# Patient Record
Sex: Female | Born: 1935
Health system: Southern US, Community
[De-identification: ages and names within clinical notes are randomized; demographics above are authoritative.]

## PROBLEM LIST (undated history)

## (undated) DIAGNOSIS — G249 Dystonia, unspecified: Secondary | ICD-10-CM

## (undated) DIAGNOSIS — H409 Unspecified glaucoma: Secondary | ICD-10-CM

## (undated) DIAGNOSIS — I10 Essential (primary) hypertension: Secondary | ICD-10-CM

## (undated) DIAGNOSIS — E079 Disorder of thyroid, unspecified: Secondary | ICD-10-CM

## (undated) DIAGNOSIS — E119 Type 2 diabetes mellitus without complications: Secondary | ICD-10-CM

## (undated) HISTORY — DX: Unspecified glaucoma: H40.9

## (undated) HISTORY — DX: Dystonia, unspecified: G24.9

## (undated) HISTORY — PX: NO PAST SURGERIES: SHX2092

---

## 2016-12-29 ENCOUNTER — Encounter (HOSPITAL_BASED_OUTPATIENT_CLINIC_OR_DEPARTMENT_OTHER): Payer: Self-pay | Admitting: *Deleted

## 2016-12-29 ENCOUNTER — Emergency Department (HOSPITAL_BASED_OUTPATIENT_CLINIC_OR_DEPARTMENT_OTHER)
Admission: EM | Admit: 2016-12-29 | Discharge: 2016-12-29 | Disposition: A | Discharge status: Home / Self Care | Payer: Medicare Other | Attending: Emergency Medicine | Admitting: Emergency Medicine

## 2016-12-29 DIAGNOSIS — R05 Cough: Secondary | ICD-10-CM | POA: Diagnosis not present

## 2016-12-29 DIAGNOSIS — I10 Essential (primary) hypertension: Secondary | ICD-10-CM | POA: Diagnosis not present

## 2016-12-29 DIAGNOSIS — J029 Acute pharyngitis, unspecified: Secondary | ICD-10-CM | POA: Insufficient documentation

## 2016-12-29 DIAGNOSIS — R059 Cough, unspecified: Secondary | ICD-10-CM

## 2016-12-29 DIAGNOSIS — R21 Rash and other nonspecific skin eruption: Secondary | ICD-10-CM | POA: Insufficient documentation

## 2016-12-29 DIAGNOSIS — E119 Type 2 diabetes mellitus without complications: Secondary | ICD-10-CM | POA: Insufficient documentation

## 2016-12-29 HISTORY — DX: Essential (primary) hypertension: I10

## 2016-12-29 HISTORY — DX: Type 2 diabetes mellitus without complications: E11.9

## 2016-12-29 HISTORY — DX: Disorder of thyroid, unspecified: E07.9

## 2016-12-29 MED ORDER — TRIAMCINOLONE ACETONIDE 0.1 % EX CREA: 1.0000 "application " | TOPICAL_CREAM | Freq: Two times a day (BID) | CUTANEOUS | 0 refills | Status: DC

## 2016-12-29 NOTE — ED Triage Notes (Signed)
Pt has a red itching rash on her forearms that began Friday, it has spread and she has some on her legs and redness and itching on her face also.  She took benadryl last pm. No change in soap, lotion or detergents

## 2016-12-29 NOTE — ED Notes (Signed)
Adult female in with pt requests that the provider come back into room and assess pt for her cough and possible allergies. PA and RN made aware, pt and visitor made aware that there may be a wait for the PA to see another pt beforehand.

## 2016-12-29 NOTE — ED Notes (Signed)
Pt waiting to speak with PA regarding a cough.

## 2016-12-29 NOTE — ED Provider Notes (Signed)
[facility]-EMERGENCY DEPT Provider Note   CSN: [id number] Arrival date & time: 12/29/16  1155     History   Chief Complaint Chief Complaint  Patient presents with  . Rash    HPI Leslie Ward is a 83 y.o. female who presents with her son today for evaluation of a itchy rash. She reports that on Friday she "went out with her friends" and since then has been developing a bumpy itchy red rash. It started on her bilateral forearms and is now present additionally on her bilateral inner knees, and 1 spot on the left side of her face.  Additionally she reports some cold/allergy type symptoms, saying that she is unsure which however she has had runny nose, stuffy nose, scratchy throat. No shortness of breath, chest pain/pressure/tightness, no fevers, chills. She is here from [place] and has been visiting her son for the past month approximately.  Son denies any new beds, mattresses, or sheets in the house.  Patient does not have a history of similar rashes. No fevers/chills at home him and no new detergents, lotions, soaps. She has taken Benadryl which she says relieved both her itching and her nasal symptoms.    HPI  Past Medical History:  Diagnosis Date  . Diabetes mellitus without complication (HCC)   . Hypertension   . Thyroid disease     There are no active problems to display for this patient.   History reviewed. No pertinent surgical history.  OB History    No data available       Home Medications    Prior to Admission medications   Medication Sig Start Date End Date Taking? Authorizing Provider  triamcinolone cream (KENALOG) 0.1 % Apply 1 application topically 2 (two) times daily. 12/29/16   Cristina Gong, PA-C    Family History No family history on file.  Social History Social History  Substance Use Topics  . Smoking status: Never Smoker  . Smokeless tobacco: Not on file  . Alcohol use No     Allergies   Patient has no known allergies.   Review of  Systems Review of Systems  Constitutional: Negative for diaphoresis, fatigue and fever.  HENT: Positive for congestion, postnasal drip, rhinorrhea and sore throat. Negative for ear pain, facial swelling, mouth sores, nosebleeds, sinus pain, sinus pressure, sneezing, trouble swallowing and voice change.   Eyes: Negative for photophobia and visual disturbance.  Respiratory: Positive for cough. Negative for choking, chest tightness, shortness of breath and stridor.   Cardiovascular: Negative for chest pain and palpitations.  Gastrointestinal: Negative for abdominal pain, diarrhea, nausea and vomiting.  Endocrine: Negative for polyuria.  Genitourinary: Negative for decreased urine volume, difficulty urinating, flank pain, hematuria and urgency.  Musculoskeletal: Negative for arthralgias, back pain, myalgias, neck pain and neck stiffness.  Skin: Positive for rash. Negative for pallor.  Neurological: Negative for syncope, weakness, light-headedness and headaches.     Physical Exam Updated Vital Signs BP (!) 158/68 (BP Location: Right Arm)   Pulse 80   Temp 98.7 F (37.1 C) (Oral)   Resp 16   Wt 53.4 kg (117 lb 12.8 oz)   SpO2 97%   Physical Exam  Constitutional: She appears well-developed and well-nourished. No distress.  HENT:  Head: Normocephalic and atraumatic.  Right Ear: Tympanic membrane normal. Tympanic membrane is not perforated, not erythematous and not bulging.  Left Ear: Tympanic membrane and ear canal normal. Tympanic membrane is not perforated, not erythematous and not bulging.  Nose: Nose normal. Right sinus  exhibits no maxillary sinus tenderness and no frontal sinus tenderness. Left sinus exhibits no maxillary sinus tenderness and no frontal sinus tenderness.  Mouth/Throat: Uvula is midline.  Right ear canal shows mild erythema, consistent with patient report of sticking her long fingernails into her ears no swelling, drainage, or tragus tenderness  Eyes: Conjunctivae are  normal. Right eye exhibits no discharge. Left eye exhibits no discharge. No scleral icterus.  Neck: Normal range of motion.  Cardiovascular: Normal rate, regular rhythm and normal heart sounds.   No murmur heard. Pulmonary/Chest: Effort normal and breath sounds normal. No stridor. No respiratory distress. She has no decreased breath sounds. She has no wheezes. She has no rhonchi. She has no rales.  Abdominal: Soft. She exhibits no distension. There is no tenderness.  Musculoskeletal: She exhibits no edema or deformity.  Lymphadenopathy:    She has no cervical adenopathy.  Neurological: She is alert. She exhibits normal muscle tone.  Skin: Skin is warm and dry. Rash noted. She is not diaphoretic.  Multiple pustules/vesicles on red base in linear pattern to bilateral forearms, with lesion on left cheek, bilateral lower legs/knees.  Appear consistent with bug bites.  No obvious drainage from wounds.    Psychiatric: She has a normal mood and affect. Her behavior is normal.  Nursing note and vitals reviewed.    ED Treatments / Results  Labs (all labs ordered are listed, but only abnormal results are displayed) Labs Reviewed - No data to display  EKG  EKG Interpretation None       Radiology No results found.  Procedures Procedures (including critical care time)  Medications Ordered in ED Medications - No data to display   Initial Impression / Assessment and Plan / ED Course  I have reviewed the triage vital signs and the nursing notes.  Pertinent labs & imaging results that were available during my care of the patient were reviewed by me and considered in my medical decision making (see chart for details).    Leslie Ward presents with a rash consistent with bug bites.  Have high suspicion for bed bug bites given lack of outdoor exposure and linear pattern of bites.  She will be given Triamcinolone cream to reduce itching and her son, who she is staying with, has been  instructed to hire a professional exterminator to evaluate for the potential presence of bedbugs.    Patient's scratchy throat is consistent with either allergies or a cold.  She was given the option for a chest x-ray, however declined imaging.  Low suspicion for pneumonia due to afebrile, normal vitals, lungs clear to auscultation bilaterally.  Appearance of throat and history is not consistent with strep throat.  Patient will be discharged and she and her son were both given strict return precautions and voiced their understanding. Patient does not have a local PCP, and was instructed to return to [facility] as needed.   At this time there does not appear to be any evidence of an acute emergency medical condition and the patient appears stable for discharge with appropriate outpatient follow up.Diagnosis was discussed with patient who verbalizes understanding and is agreeable to discharge. Pt case discussed with Dr. Jacqulyn Bath who agrees with my plan.   Final Clinical Impressions(s) / ED Diagnoses   Final diagnoses:  Rash  Rash and nonspecific skin eruption  Sore throat  Cough    New Prescriptions Discharge Medication List as of 12/29/2016  2:59 PM    START taking these medications   Details  triamcinolone cream (KENALOG) 0.1 % Apply 1 application topically 2 (two) times daily., Starting Sun 12/29/2016, Print         Cristina GongHammond, Avary Pitsenbarger W, PA-C 12/31/16 [id number]    Maia PlanLong, Joshua G, MD 12/31/16 [phone]

## 2016-12-29 NOTE — Discharge Instructions (Signed)
Today your rash looks like bug bites.  I have included some reading material on bug bites in general and how to look for bed bugs.  I highly suggest having a professional exterminator come and evaluate your house. Bed bugs can affect people of all socioeconomic status and are not an indicator of uncleanliness.  I have prescribed do a steroid cream to put on your rash. You may continue to take Benadryl for your itching.  Please be cautious as Benadryl can make you very drowsy/sleepy and have other potential side effects.  Please do not drive with in 24 hours after taking benadryl or if you feel sleepy or drowsy.    If her symptoms fail to improve in 3-5 days or if they continue to worsen and/or spread please seek additional medical care. If you have any fevers, nausea, vomiting, develop signs of infection or have any concerns please do not hesitate to seek additional medical care.

## 2017-10-16 ENCOUNTER — Other Ambulatory Visit: Payer: Self-pay

## 2017-10-16 ENCOUNTER — Emergency Department (HOSPITAL_BASED_OUTPATIENT_CLINIC_OR_DEPARTMENT_OTHER)
Admission: EM | Admit: 2017-10-16 | Discharge: 2017-10-16 | Disposition: A | Discharge status: Home / Self Care | Payer: Medicare Other | Attending: Emergency Medicine | Admitting: Emergency Medicine

## 2017-10-16 ENCOUNTER — Emergency Department (HOSPITAL_BASED_OUTPATIENT_CLINIC_OR_DEPARTMENT_OTHER): Payer: Medicare Other

## 2017-10-16 ENCOUNTER — Encounter (HOSPITAL_BASED_OUTPATIENT_CLINIC_OR_DEPARTMENT_OTHER): Payer: Self-pay | Admitting: *Deleted

## 2017-10-16 DIAGNOSIS — R059 Cough, unspecified: Secondary | ICD-10-CM

## 2017-10-16 DIAGNOSIS — I10 Essential (primary) hypertension: Secondary | ICD-10-CM | POA: Insufficient documentation

## 2017-10-16 DIAGNOSIS — R05 Cough: Secondary | ICD-10-CM

## 2017-10-16 DIAGNOSIS — J111 Influenza due to unidentified influenza virus with other respiratory manifestations: Secondary | ICD-10-CM | POA: Diagnosis not present

## 2017-10-16 DIAGNOSIS — Z79899 Other long term (current) drug therapy: Secondary | ICD-10-CM | POA: Insufficient documentation

## 2017-10-16 DIAGNOSIS — R69 Illness, unspecified: Secondary | ICD-10-CM

## 2017-10-16 DIAGNOSIS — E119 Type 2 diabetes mellitus without complications: Secondary | ICD-10-CM | POA: Diagnosis not present

## 2017-10-16 IMAGING — DX DG CHEST 2V
2 series · 2 of 2 positions shown · non-contrast
Comparison: None.

CLINICAL DATA: Flu like symptoms x 3 days with exposure to flu
recently, hx of HTN, diabetes, thyroid disease, no other complaints

EXAM:
CHEST - 2 VIEW

[chest pa]
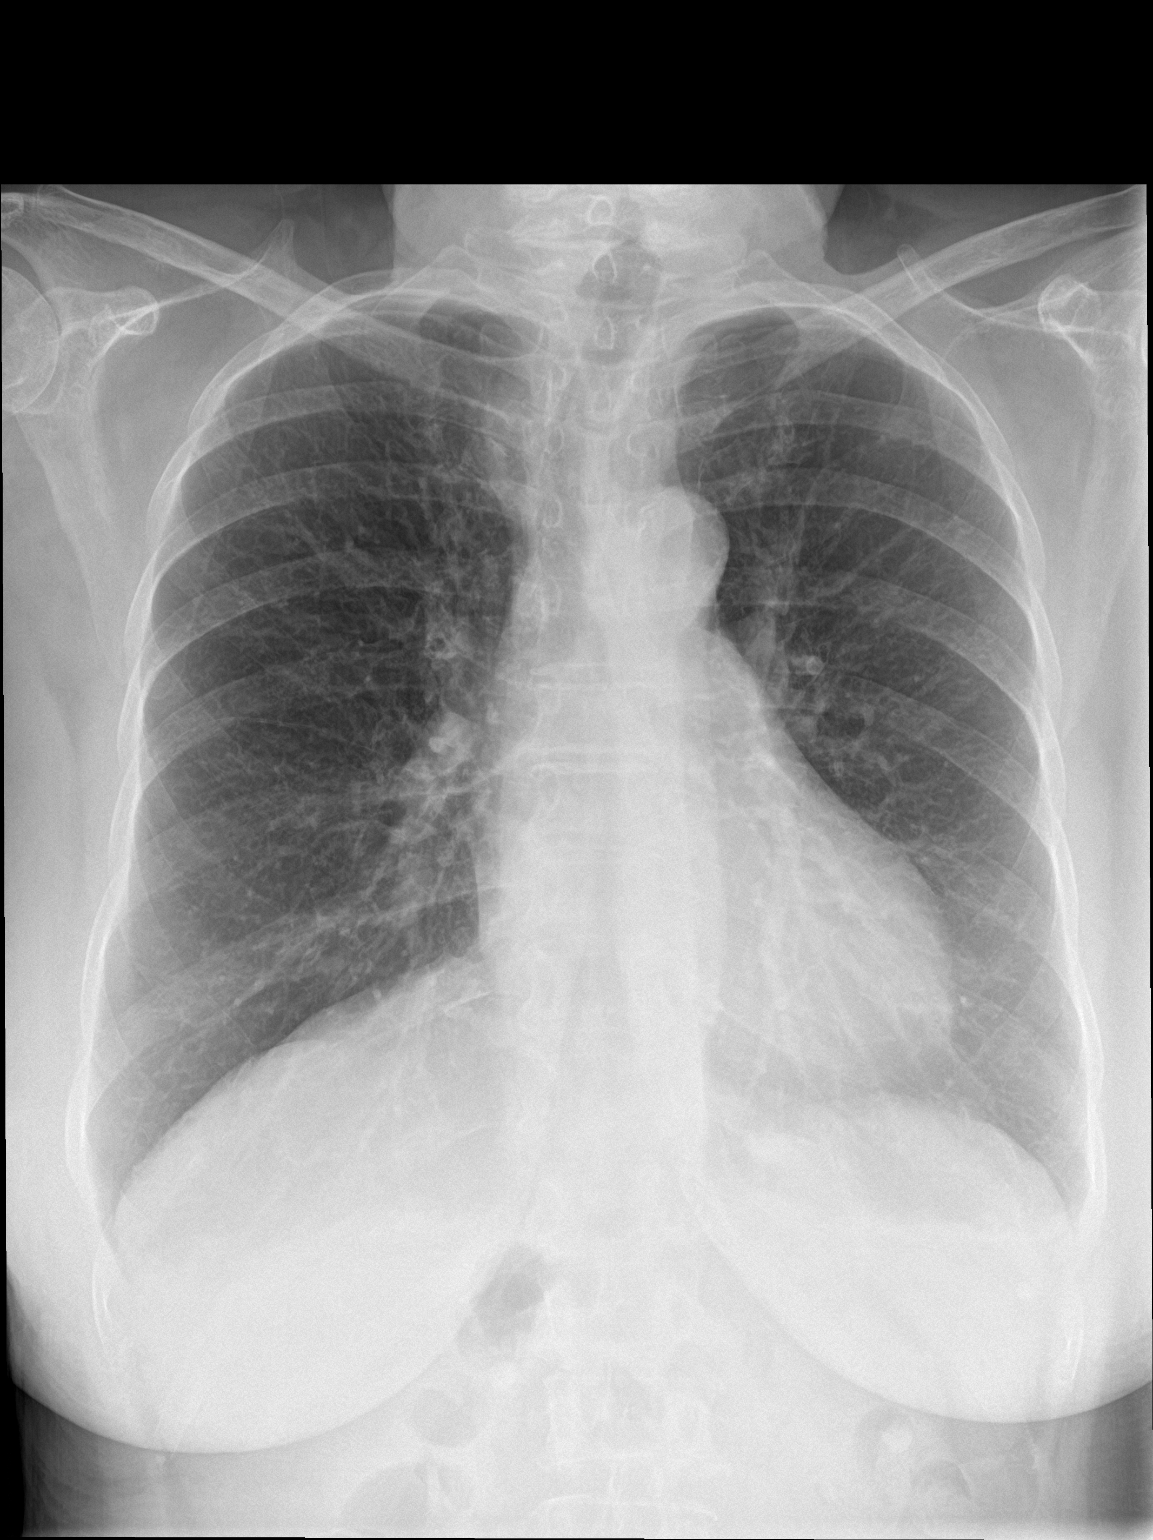

[chest lat]
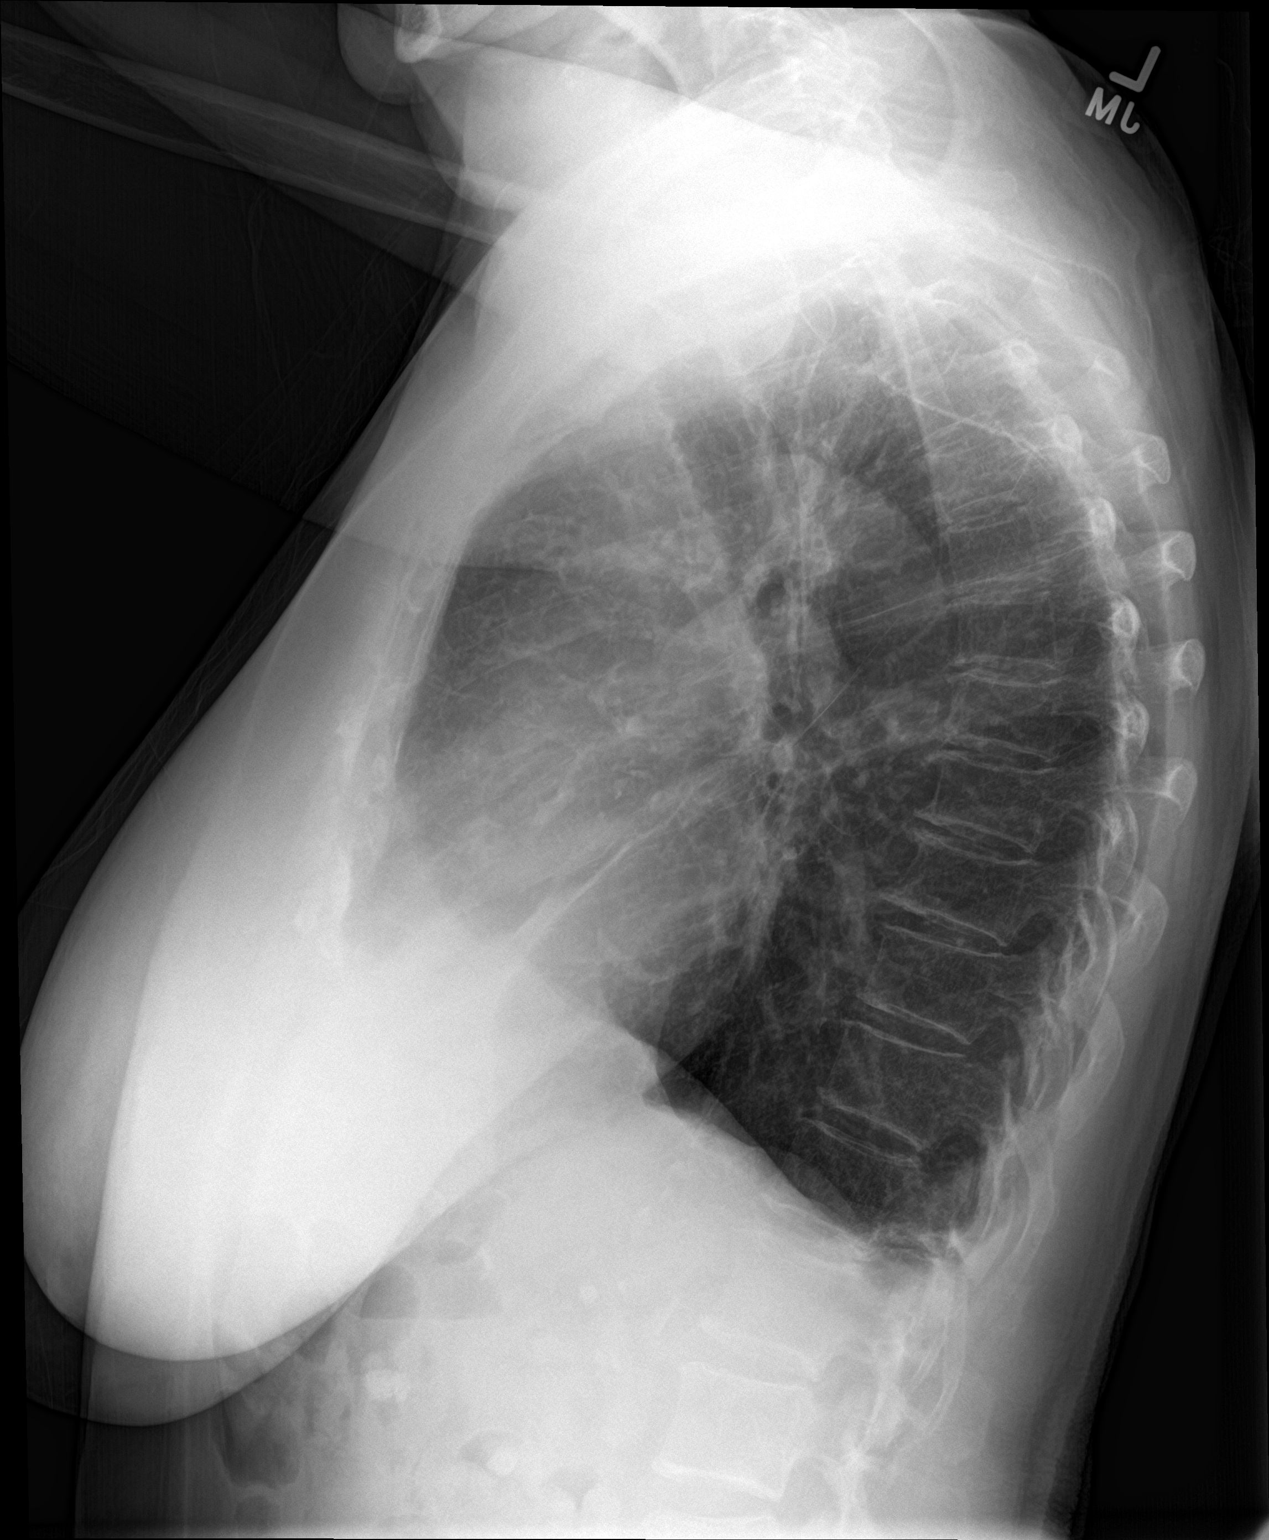

[2 of 2 positions shown; findings below may reference images not displayed]

FINDINGS: The heart size and mediastinal contours are within normal limits.
Both lungs are clear. The visualized skeletal structures are
unremarkable.
IMPRESSION: No active cardiopulmonary disease.

## 2017-10-16 NOTE — ED Notes (Signed)
Patient transported to X-ray 

## 2017-10-16 NOTE — ED Provider Notes (Signed)
[facility] EMERGENCY DEPARTMENT Provider Note   CSN: [id number] Arrival date & time: 10/16/17  1129     History   Chief Complaint Chief Complaint  Patient presents with  . Influenza    HPI Leslie Ward is a 84 y.o. female.  The history is provided by the patient and medical records. No language interpreter was used.  Influenza  Presenting symptoms: cough, fever (Subjective) and shortness of breath (Resolved)   Presenting symptoms: no headaches, no nausea, no sore throat and no vomiting   Associated symptoms: chills and nasal congestion     Leslie Ward is a 84 y.o. female  with a PMH of DM, HTN, thyroid disorder who presents to the Emergency Department complaining of dry cough, congestion and weakness over the last 3 days.  Associated with fever and chills.  Fever actually subsided last night and patient has been afebrile throughout the day today.  She initially felt a little short of breath, but this is improved as well.  No chest pain, abdominal pain, nausea or vomiting.  Grandson with similar symptoms and was diagnosed with the flu.  He was started on Tamiflu, but had several side effects, therefore this was discontinued.  No prophylactic medications provided to patient.  No medications taken prior to arrival for symptoms.  No alleviating or aggravating factors noted.  Past Medical History:  Diagnosis Date  . Diabetes mellitus without complication (HCC)   . Hypertension   . Thyroid disease     There are no active problems to display for this patient.   History reviewed. No pertinent surgical history.  OB History    No data available       Home Medications    Prior to Admission medications   Medication Sig Start Date End Date Taking? Authorizing Provider  amLODipine (NORVASC) 5 MG tablet Take 5 mg by mouth daily.   Yes [provider]  Aspirin (ASPIR-81 PO) Take by mouth.   Yes [provider]  LOSARTAN POTASSIUM PO Take by  mouth.   Yes [provider]  metFORMIN (GLUCOPHAGE) 500 MG tablet Take by mouth 2 (two) times daily with a meal.   Yes [provider]  metoprolol tartrate (LOPRESSOR) 50 MG tablet Take 50 mg by mouth 2 (two) times daily.   Yes [provider]  rosuvastatin (CRESTOR) 5 MG tablet Take 5 mg by mouth daily.   Yes [provider]  triamcinolone cream (KENALOG) 0.1 % Apply 1 application topically 2 (two) times daily. 12/29/16   Cristina GongHammond, Elizabeth W, PA-C    Family History No family history on file.  Social History Social History   Tobacco Use  . Smoking status: Never Smoker  . Smokeless tobacco: Never Used  Substance Use Topics  . Alcohol use: No  . Drug use: No     Allergies   Patient has no known allergies.   Review of Systems Review of Systems  Constitutional: Positive for chills and fever (Subjective).  HENT: Positive for congestion. Negative for sore throat.   Respiratory: Positive for cough and shortness of breath (Resolved).   Cardiovascular: Negative for chest pain.  Gastrointestinal: Negative for abdominal pain, blood in stool, constipation, nausea and vomiting.  Genitourinary: Negative for difficulty urinating and dysuria.  Musculoskeletal: Negative for back pain.  Neurological: Negative for weakness and headaches.     Physical Exam Updated Vital Signs BP (!) 174/77   Pulse 72   Temp 98.8 F (37.1 C) (Oral)   Resp [name](!)  24   Ht 4\' 9"  (1.448 m)   Wt 49.9 kg (110 lb)   SpO2 98%   BMI 23.80 kg/m   Physical Exam  Constitutional: She is oriented to person, place, and time. She appears well-developed and well-nourished. No distress.  Non-toxic appearing.  HENT:  Head: Normocephalic and atraumatic.  Cardiovascular: Normal rate, regular rhythm and normal heart sounds.  No murmur heard. Pulmonary/Chest: Effort normal and breath sounds normal. No respiratory distress.  Lungs clear to auscultation bilaterally.  Abdominal: Soft.  She exhibits no distension.  No abdominal tenderness.  Musculoskeletal: Normal range of motion.  Neurological: She is alert and oriented to person, place, and time.  Skin: Skin is warm and dry.  Nursing note and vitals reviewed.    ED Treatments / Results  Labs (all labs ordered are listed, but only abnormal results are displayed) Labs Reviewed - No data to display  EKG  EKG Interpretation None       Radiology Dg Chest 2 View  Result Date: 10/16/2017 CLINICAL DATA:  Flu like symptoms x 3 days with exposure to flu recently, hx of HTN, diabetes, thyroid disease, no other complaints EXAM: CHEST - 2 VIEW COMPARISON:  None. FINDINGS: The heart size and mediastinal contours are within normal limits. Both lungs are clear. The visualized skeletal structures are unremarkable. IMPRESSION: No active cardiopulmonary disease. Electronically Signed   By: Elige Ko   On: 10/16/2017 12:31    Procedures Procedures (including critical care time)  Medications Ordered in ED Medications - No data to display   Initial Impression / Assessment and Plan / ED Course  I have reviewed the triage vital signs and the nursing notes.  Pertinent labs & imaging results that were available during my care of the patient were reviewed by me and considered in my medical decision making (see chart for details).    Leslie Ward is a 84 y.o. female who presents to ED for fever, cough, congestion x 3 days. Fever actually broke last night and she has been afebrile thus far today. Afebrile in ED today.  Nontoxic-appearing and hemodynamically stable.  Lungs clear to auscultation bilaterally. Chest x-ray negative. Her grandson was diagnosed with the flu and started on Tamiflu however had significant side effects and therefore Tamiflu was discontinued.  Patient does have improving symptoms.  She is no longer febrile.  Chest x-ray is unremarkable.  Likely did / does have a flulike illness.  Discussed option of  Tamiflu.  She is outside 48-hour window and feeling better.  Given grandson had a bad reaction, she is not wanting to start treatment.  Feel this is reasonable.  PCP follow-up encouraged.  Reasons to return to ER discussed as well as home care instructions.  All questions answered.   Final Clinical Impressions(s) / ED Diagnoses   Final diagnoses:  Cough  Influenza-like illness    ED Discharge Orders    None       Melanie Pellot, Chase Picket, PA-C 10/16/17 1357    Raeford Razor, MD 10/16/17 1600

## 2017-10-16 NOTE — Discharge Instructions (Signed)
It was my pleasure taking care of you today!   Fortunately, your x-ray was normal.   Increase fluid intake, rest. Tylenol or ibuprofen as needed for pain.   Follow up with your primary care doctor for recheck in about a week.   Return to ER for return of fever, new or worsening symptoms, any additional concerns.

## 2017-10-16 NOTE — ED Triage Notes (Signed)
Flu like symptoms x 3 days with exposure.

## 2017-11-10 ENCOUNTER — Emergency Department (HOSPITAL_BASED_OUTPATIENT_CLINIC_OR_DEPARTMENT_OTHER)
Admission: EM | Admit: 2017-11-10 | Discharge: 2017-11-10 | Disposition: A | Discharge status: Home / Self Care | Payer: Medicare Other | Attending: Emergency Medicine | Admitting: Emergency Medicine

## 2017-11-10 ENCOUNTER — Encounter (HOSPITAL_BASED_OUTPATIENT_CLINIC_OR_DEPARTMENT_OTHER): Payer: Self-pay

## 2017-11-10 ENCOUNTER — Emergency Department (HOSPITAL_BASED_OUTPATIENT_CLINIC_OR_DEPARTMENT_OTHER): Payer: Medicare Other

## 2017-11-10 ENCOUNTER — Other Ambulatory Visit: Payer: Self-pay

## 2017-11-10 DIAGNOSIS — J069 Acute upper respiratory infection, unspecified: Secondary | ICD-10-CM | POA: Diagnosis not present

## 2017-11-10 DIAGNOSIS — H9202 Otalgia, left ear: Secondary | ICD-10-CM | POA: Diagnosis not present

## 2017-11-10 DIAGNOSIS — B349 Viral infection, unspecified: Secondary | ICD-10-CM | POA: Diagnosis not present

## 2017-11-10 DIAGNOSIS — B9789 Other viral agents as the cause of diseases classified elsewhere: Secondary | ICD-10-CM

## 2017-11-10 DIAGNOSIS — E119 Type 2 diabetes mellitus without complications: Secondary | ICD-10-CM | POA: Insufficient documentation

## 2017-11-10 DIAGNOSIS — R05 Cough: Secondary | ICD-10-CM | POA: Diagnosis present

## 2017-11-10 DIAGNOSIS — I10 Essential (primary) hypertension: Secondary | ICD-10-CM | POA: Insufficient documentation

## 2017-11-10 IMAGING — CR DG CHEST 2V
2 series · 2 of 2 positions shown · non-contrast
Comparison: [DATE]

CLINICAL DATA: Cough and congestion for 1 week.

EXAM:
CHEST - 2 VIEW

[w chest pa]
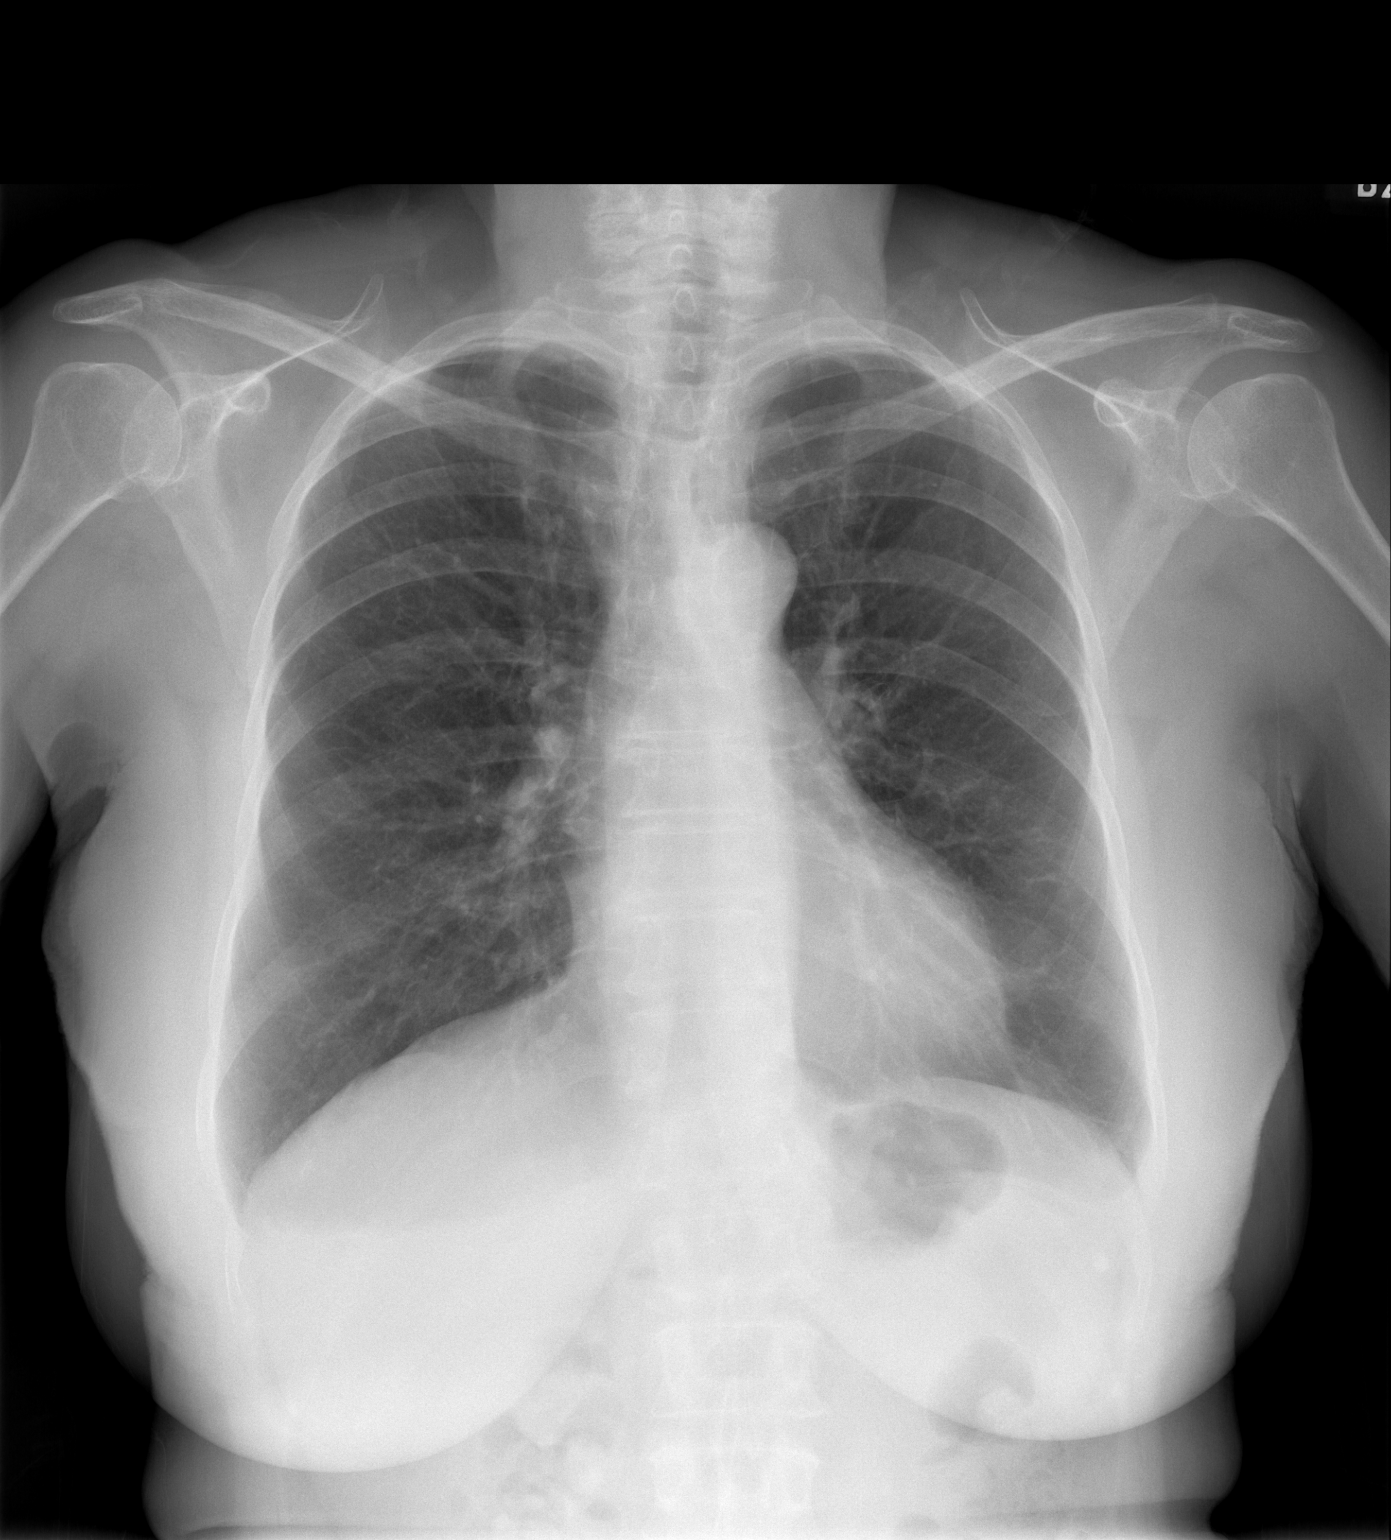

[w chest lat]
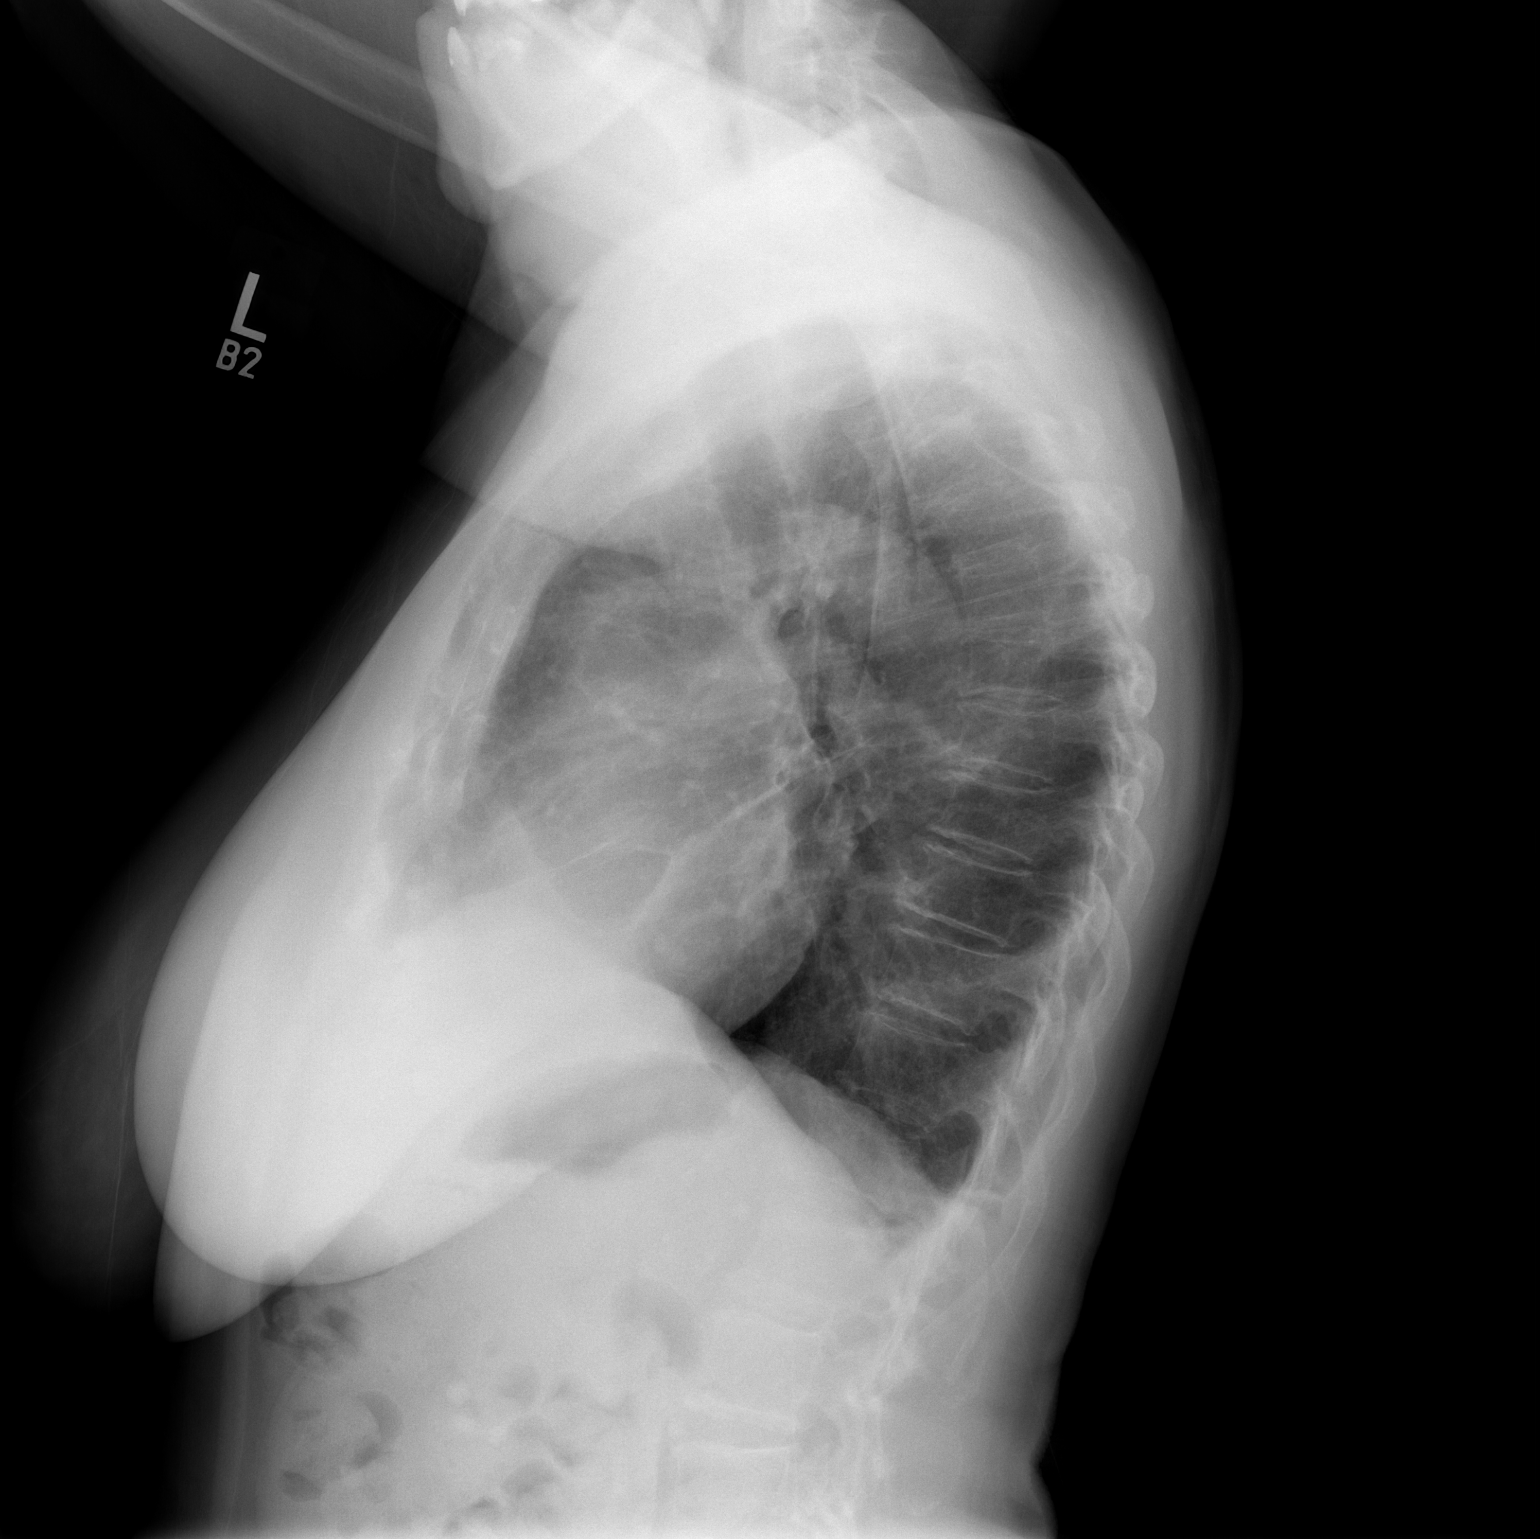

[2 of 2 positions shown; findings below may reference images not displayed]

FINDINGS: Cardiomediastinal silhouette is normal. Mediastinal contours appear
intact. Calcific atherosclerotic disease of the aorta.

There is no evidence of focal airspace consolidation, pleural
effusion or pneumothorax.

Osseous structures are without acute abnormality. Soft tissues are
grossly normal.
IMPRESSION: No active cardiopulmonary disease.

## 2017-11-10 MED ORDER — NEOMYCIN-POLYMYXIN-HC 3.5-10000-1 OT SUSP: 4.0000 [drp] | Freq: Three times a day (TID) | OTIC | 0 refills | Status: AC

## 2017-11-10 MED FILL — NEOMYCIN-POLYMYXIN-HC EAR S: 3.5-10000-1 | 10 days supply | Qty: 10 | Fill #0

## 2017-11-10 NOTE — ED Notes (Signed)
ED Provider at bedside. 

## 2017-11-10 NOTE — ED Triage Notes (Signed)
C/o flu like sx x 1 week-increase pain both ears and drainage left ear x 2-3 days-NAD-steady gait

## 2017-11-10 NOTE — ED Provider Notes (Signed)
Emergency Department Provider Note   I have reviewed the triage vital signs and the nursing notes.   HISTORY  Chief Complaint Cough   HPI Leslie Ward is a 84 y.o. female with PMH of DM and HTN resents to the emergency department for evaluation of left ear pain with drainage and some blood.  Symptoms have worsened over the past several days but drainage began today.  She describes some associated headache and face pain.  Of note, the patient was recently diagnosed with influenza. She states she continues to have a mild cough but otherwise her flu symptoms have resolved.  After this infection, she developed the ear pain and mild headaches.  When the drainage and some blood came out today she presented to the emergency department.  No radiation of symptoms or modifying factors.  Past Medical History:  Diagnosis Date  . Diabetes mellitus without complication (HCC)   . Hypertension   . Thyroid disease     There are no active problems to display for this patient.   History reviewed. No pertinent surgical history.  Current Outpatient Rx  . Order #: [id number] Class: Historical Med  . Order #: [id number] Class: Historical Med  . Order #: [id number] Class: Historical Med  . Order #: [id number] Class: Historical Med  . Order #: [id number] Class: Historical Med  . Order #: [id number] Class: Print  . Order #: [id number] Class: Historical Med  . Order #: [id number] Class: Print    Allergies Patient has no known allergies.  No family history on file.  Social History Social History   Tobacco Use  . Smoking status: Never Smoker  . Smokeless tobacco: Never Used  Substance Use Topics  . Alcohol use: No  . Drug use: No    Review of Systems  Constitutional: No fever/chills Eyes: No visual changes. ENT: No sore throat. Positive bilateral ear pain and left ear drainage.  Cardiovascular: Denies chest pain. Respiratory: Denies shortness of breath. Gastrointestinal: No abdominal pain.   No nausea, no vomiting.  No diarrhea.  No constipation. Genitourinary: Negative for dysuria. Musculoskeletal: Negative for back pain. Skin: Negative for rash. Neurological: Negative for focal weakness or numbness. Positive HA.   10-point ROS otherwise negative.  ____________________________________________   PHYSICAL EXAM:  VITAL SIGNS: ED Triage Vitals  Enc Vitals Group     BP 11/10/17 1251 (!) 153/66     Pulse Rate 11/10/17 1251 97     Resp 11/10/17 1251 20     Temp 11/10/17 1251 98.2 F (36.8 C)     Temp Source 11/10/17 1251 Oral     SpO2 11/10/17 1251 98 %     Weight 11/10/17 1250 118 lb 2.7 oz (53.6 kg)     Height 11/10/17 1250 4\' 11"  (1.499 m)     Pain Score 11/10/17 1248 10   Constitutional: Alert and oriented. Well appearing and in no acute distress. Eyes: Conjunctivae are normal. PERRL. EOMI. Head: Atraumatic. Ears:  Mild erythema in the left ear canal without exudate or bleeding. Normal TMs bilaterally. No mastoid tenderness.  Nose: Positive mild congestion/rhinnorhea. Mouth/Throat: Mucous membranes are moist.  Oropharynx non-erythematous. Neck: No stridor.  Cardiovascular: Normal rate, regular rhythm. Good peripheral circulation. Grossly normal heart sounds.   Respiratory: Normal respiratory effort.  No retractions. Lungs CTAB. Gastrointestinal: Soft and nontender. No distention.  Musculoskeletal: No lower extremity tenderness nor edema. No gross deformities of extremities. Neurologic:  Normal speech and language. No gross focal neurologic deficits are appreciated.  Skin:  Skin is warm, dry and intact. No  rash noted.  ____________________________________________  RADIOLOGY  No results found.  ____________________________________________   PROCEDURES  Procedure(s) performed:   Procedures  None ____________________________________________   INITIAL IMPRESSION / ASSESSMENT AND PLAN / ED COURSE  Pertinent labs & imaging results that were available  during my care of the patient were reviewed by me and considered in my medical decision making (see chart for details).  Emergency department for evaluation of left ear pain and drainage.  On exam of the left ear canal there is mild erythema.  No thick, purulent drainage.  No mastoid tenderness.  The tympanic membrane has some mild fluid behind it but no erythema or bulging.  Patient's throat shows mild erythema without exudate.  Chest x-ray ordered prior to my evaluation which shows no pneumonia.  Plan to treat patient symptoms as possible developing otitis externa with some mild erythema of the canal and report of drainage at home.  Advised patient to take Tylenol.  Will provide contact information for outpatient ENT should symptoms continue or worsen.  The patient does not have any temporal artery tenderness or vision changes to suggest more serious underlying etiology.  She has an intact neurological exam including all cranial nerves.   At this time, I do not feel there is any life-threatening condition present. I have reviewed and discussed all results (EKG, imaging, lab, urine as appropriate), exam findings with patient. I have reviewed nursing notes and appropriate previous records.  I feel the patient is safe to be discharged home without further emergent workup. Discussed usual and customary return precautions. Patient and family (if present) verbalize understanding and are comfortable with this plan.  Patient will follow-up with their primary care provider. If they do not have a primary care provider, information for follow-up has been provided to them. All questions have been answered.  ____________________________________________  FINAL CLINICAL IMPRESSION(S) / ED DIAGNOSES  Final diagnoses:  Viral URI with cough  Left ear pain     NEW OUTPATIENT MEDICATIONS STARTED DURING THIS VISIT:  Discharge Medication List as of 11/10/2017  3:26 PM    START taking these medications   Details    neomycin-polymyxin-hydrocortisone (CORTISPORIN) 3.5-10000-1 OTIC suspension Place 4 drops into both ears 3 (three) times daily for 10 days., Starting Mon 11/10/2017, Until Thu 11/20/2017, Print        Note:  This document was prepared using Dragon voice recognition software and may include unintentional dictation errors.  Alona Bene, MD Emergency Medicine    Maecyn Panning, Arlyss Repress, MD 11/11/17 [phone]

## 2017-11-10 NOTE — Discharge Instructions (Signed)
You have been seen in the Emergency Department (ED) today for a likely viral illness.  Please drink plenty of clear fluids (water, Gatorade, chicken broth, etc).  You may use Tylenol according to label instructions.  You can alternate between the two without any side effects.   Use the eardrops as prescribed. Follow up with ENT as needed with any new or worsening symptoms.   Please follow up with your doctor as listed above.  Call your doctor or return to the Emergency Department (ED) if you are unable to tolerate fluids due to vomiting, have worsening trouble breathing, become extremely tired or difficult to awaken, or if you develop any other symptoms that concern you.

## 2018-01-31 ENCOUNTER — Encounter (HOSPITAL_BASED_OUTPATIENT_CLINIC_OR_DEPARTMENT_OTHER): Payer: Self-pay | Admitting: Emergency Medicine

## 2018-01-31 ENCOUNTER — Other Ambulatory Visit: Payer: Self-pay

## 2018-01-31 ENCOUNTER — Emergency Department (HOSPITAL_BASED_OUTPATIENT_CLINIC_OR_DEPARTMENT_OTHER): Payer: Medicare Other

## 2018-01-31 ENCOUNTER — Emergency Department (HOSPITAL_BASED_OUTPATIENT_CLINIC_OR_DEPARTMENT_OTHER)
Admission: EM | Admit: 2018-01-31 | Discharge: 2018-01-31 | Disposition: A | Discharge status: Home / Self Care | Payer: Medicare Other | Attending: Emergency Medicine | Admitting: Emergency Medicine

## 2018-01-31 DIAGNOSIS — S46912A Strain of unspecified muscle, fascia and tendon at shoulder and upper arm level, left arm, initial encounter: Secondary | ICD-10-CM | POA: Insufficient documentation

## 2018-01-31 DIAGNOSIS — Y929 Unspecified place or not applicable: Secondary | ICD-10-CM | POA: Diagnosis not present

## 2018-01-31 DIAGNOSIS — S4992XA Unspecified injury of left shoulder and upper arm, initial encounter: Secondary | ICD-10-CM | POA: Diagnosis present

## 2018-01-31 DIAGNOSIS — Z7984 Long term (current) use of oral hypoglycemic drugs: Secondary | ICD-10-CM | POA: Diagnosis not present

## 2018-01-31 DIAGNOSIS — W07XXXA Fall from chair, initial encounter: Secondary | ICD-10-CM | POA: Diagnosis not present

## 2018-01-31 DIAGNOSIS — S20212A Contusion of left front wall of thorax, initial encounter: Secondary | ICD-10-CM | POA: Diagnosis not present

## 2018-01-31 DIAGNOSIS — Z7982 Long term (current) use of aspirin: Secondary | ICD-10-CM | POA: Diagnosis not present

## 2018-01-31 DIAGNOSIS — Y939 Activity, unspecified: Secondary | ICD-10-CM | POA: Insufficient documentation

## 2018-01-31 DIAGNOSIS — I10 Essential (primary) hypertension: Secondary | ICD-10-CM | POA: Diagnosis not present

## 2018-01-31 DIAGNOSIS — Y998 Other external cause status: Secondary | ICD-10-CM | POA: Insufficient documentation

## 2018-01-31 DIAGNOSIS — W19XXXA Unspecified fall, initial encounter: Secondary | ICD-10-CM

## 2018-01-31 DIAGNOSIS — E119 Type 2 diabetes mellitus without complications: Secondary | ICD-10-CM | POA: Insufficient documentation

## 2018-01-31 DIAGNOSIS — Z79899 Other long term (current) drug therapy: Secondary | ICD-10-CM | POA: Insufficient documentation

## 2018-01-31 IMAGING — DX DG RIBS W/ CHEST 3+V*L*
3 series · 3 of 3 positions shown · non-contrast
Comparison: [DATE]

CLINICAL DATA: Fall yesterday with left shoulder and rib pain.

EXAM:
LEFT RIBS AND CHEST - 3+ VIEW

[chest pa]
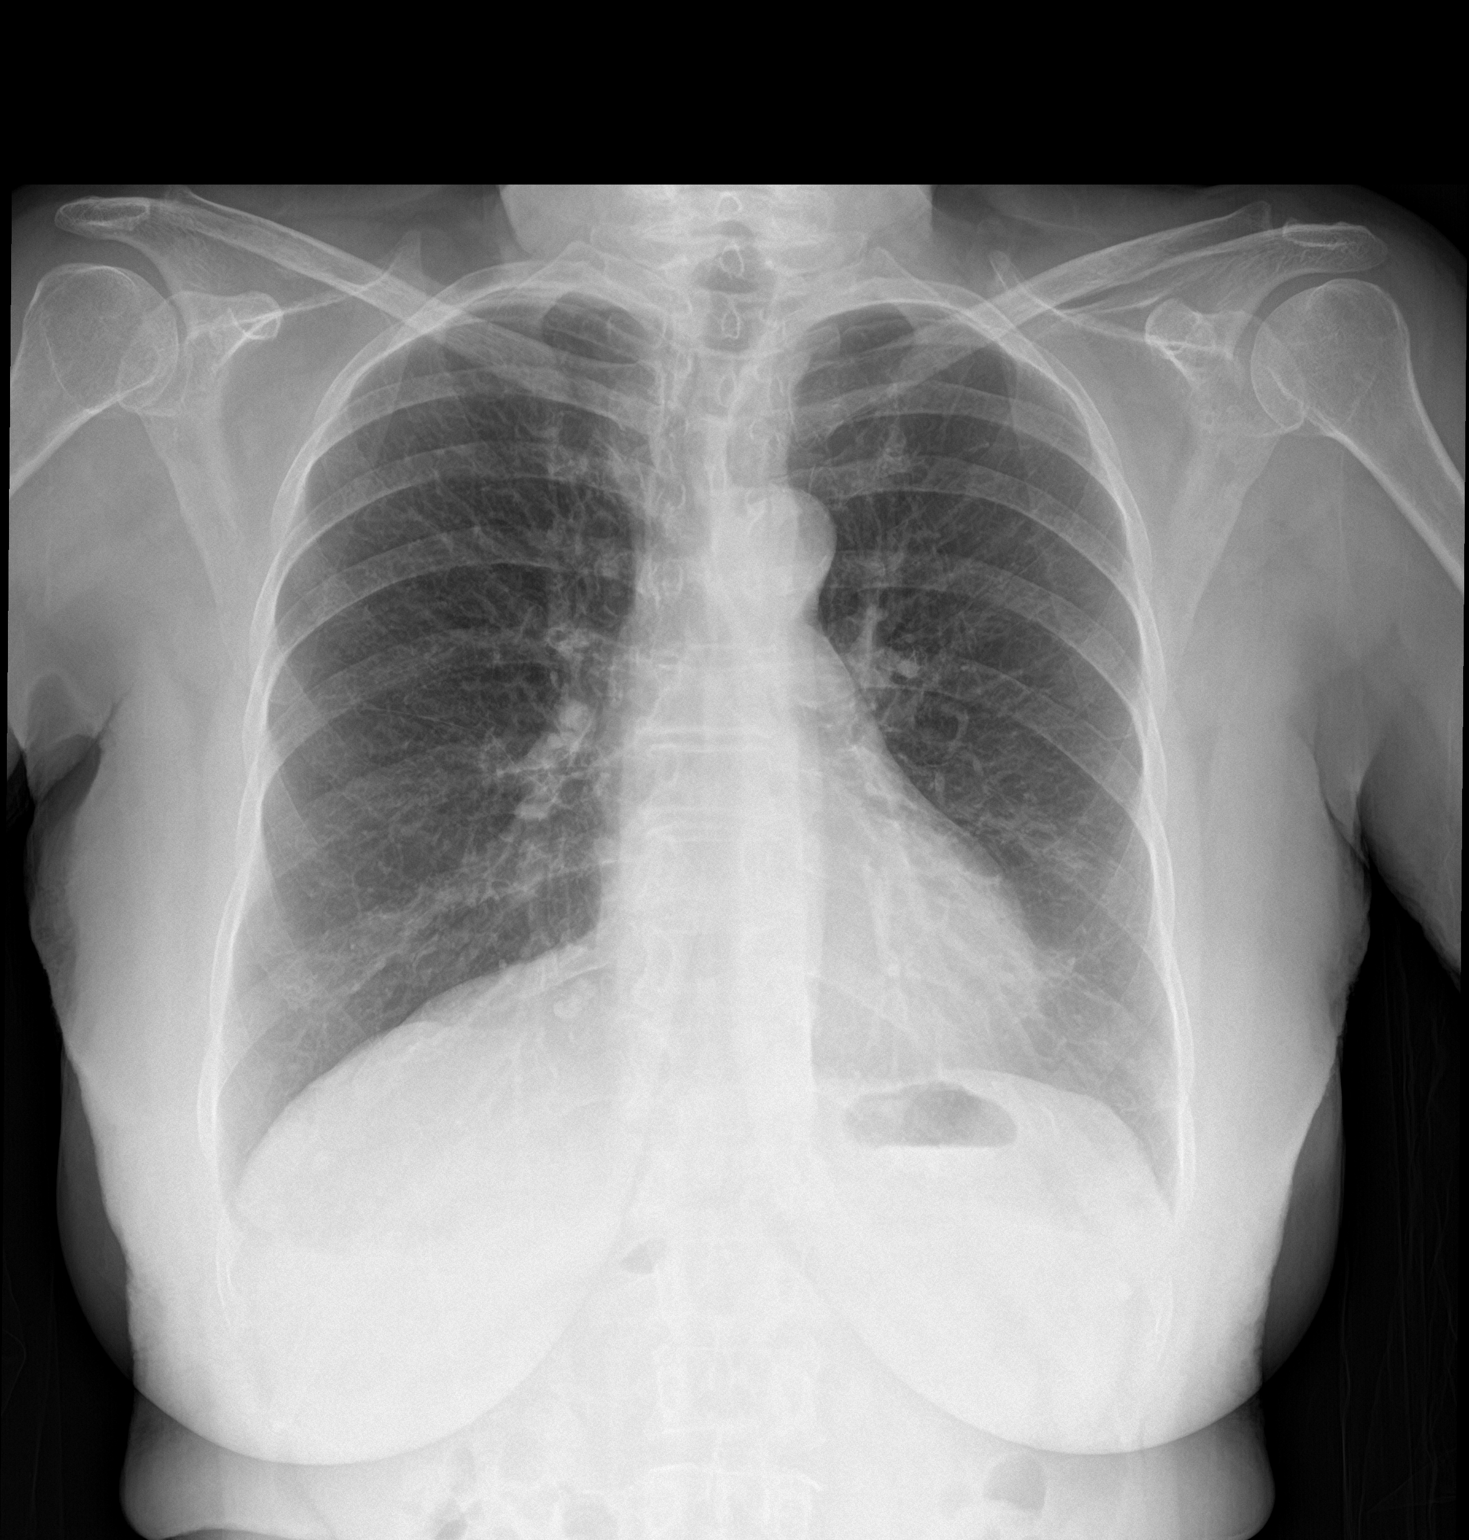

[rib pa]
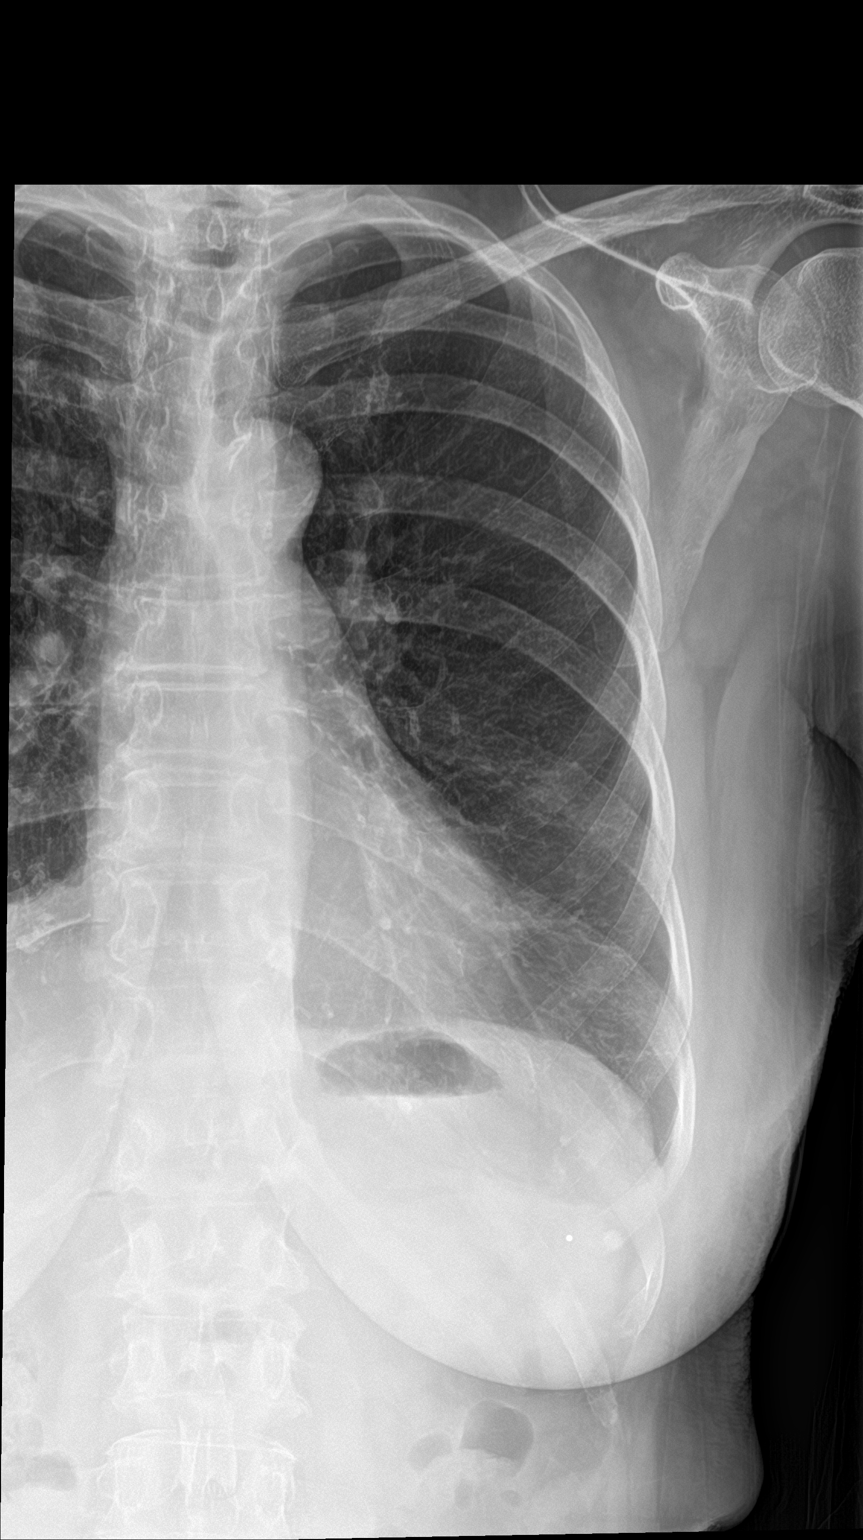

[rib pa obl]
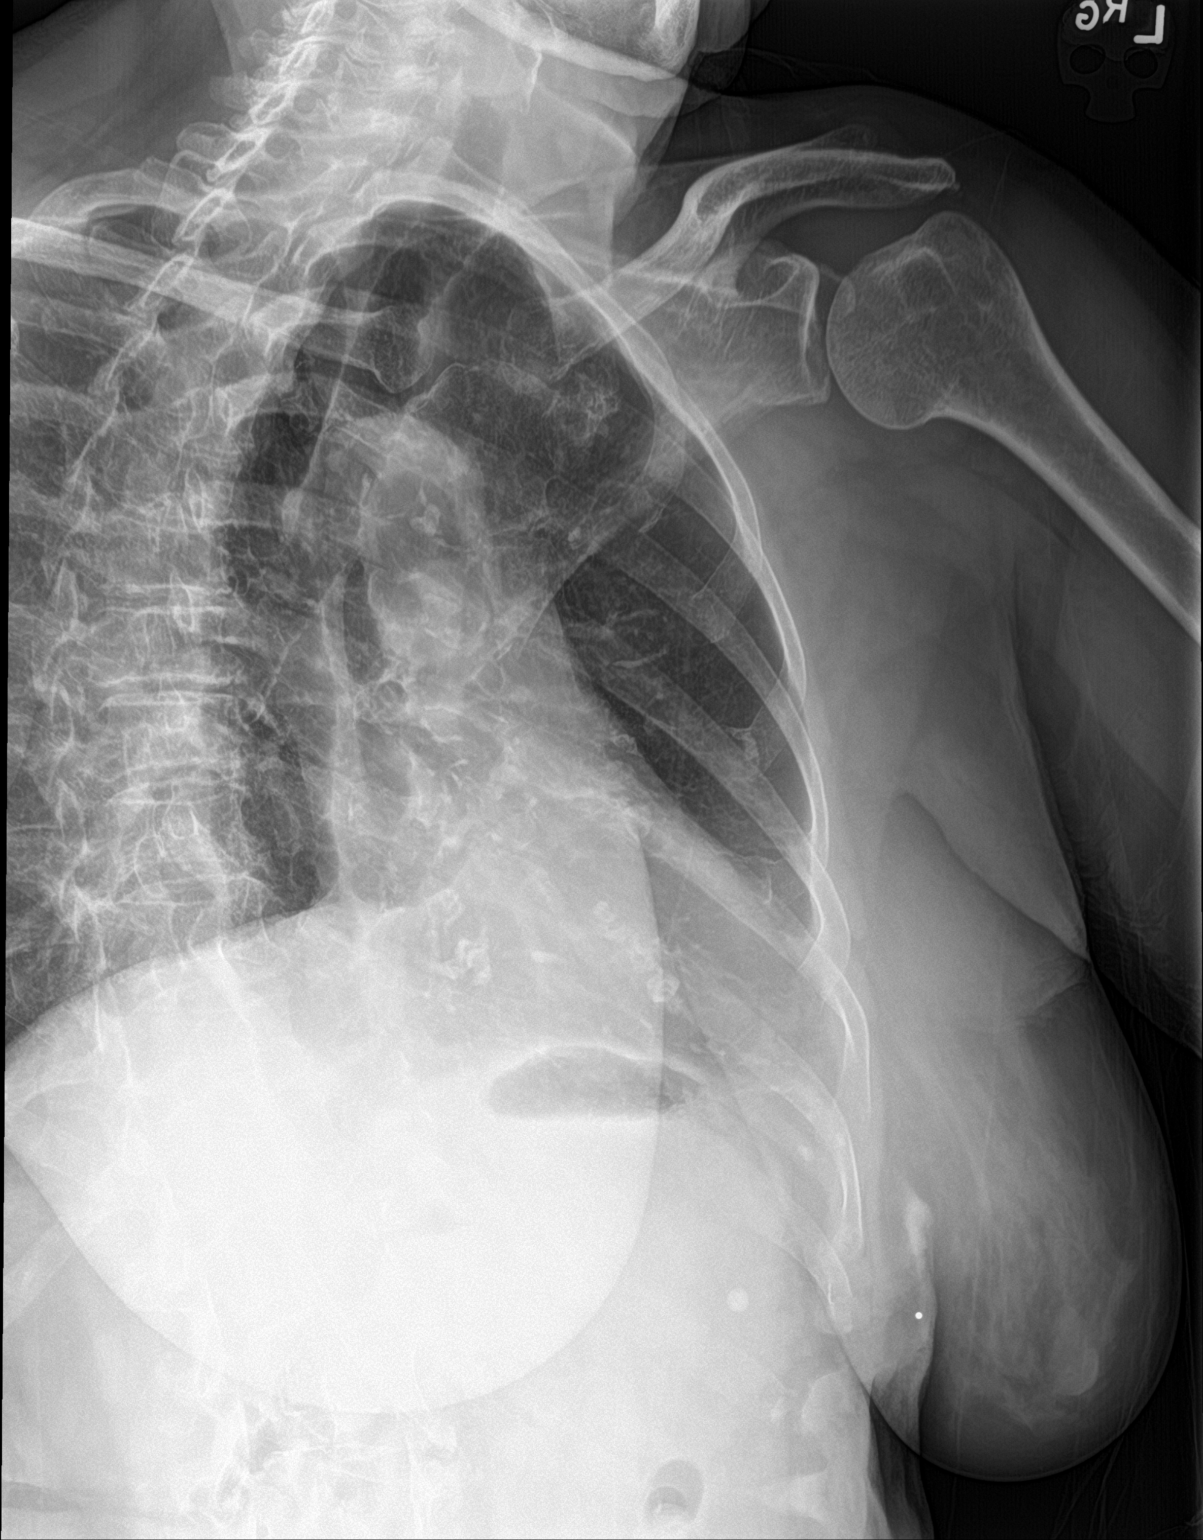

[3 of 3 positions shown; findings below may reference images not displayed]

FINDINGS: Lungs are adequately inflated without consolidation, effusion or
pneumothorax. Cardiomediastinal silhouette is within normal. No
definite rib fracture.
IMPRESSION: No acute findings.

## 2018-01-31 IMAGING — DX DG SHOULDER 2+V*L*
3 series · 3 of 3 positions shown · non-contrast
Comparison: None.

CLINICAL DATA: Fall yesterday with left shoulder pain.

EXAM:
LEFT SHOULDER - 2+ VIEW

[shoulder grashey]
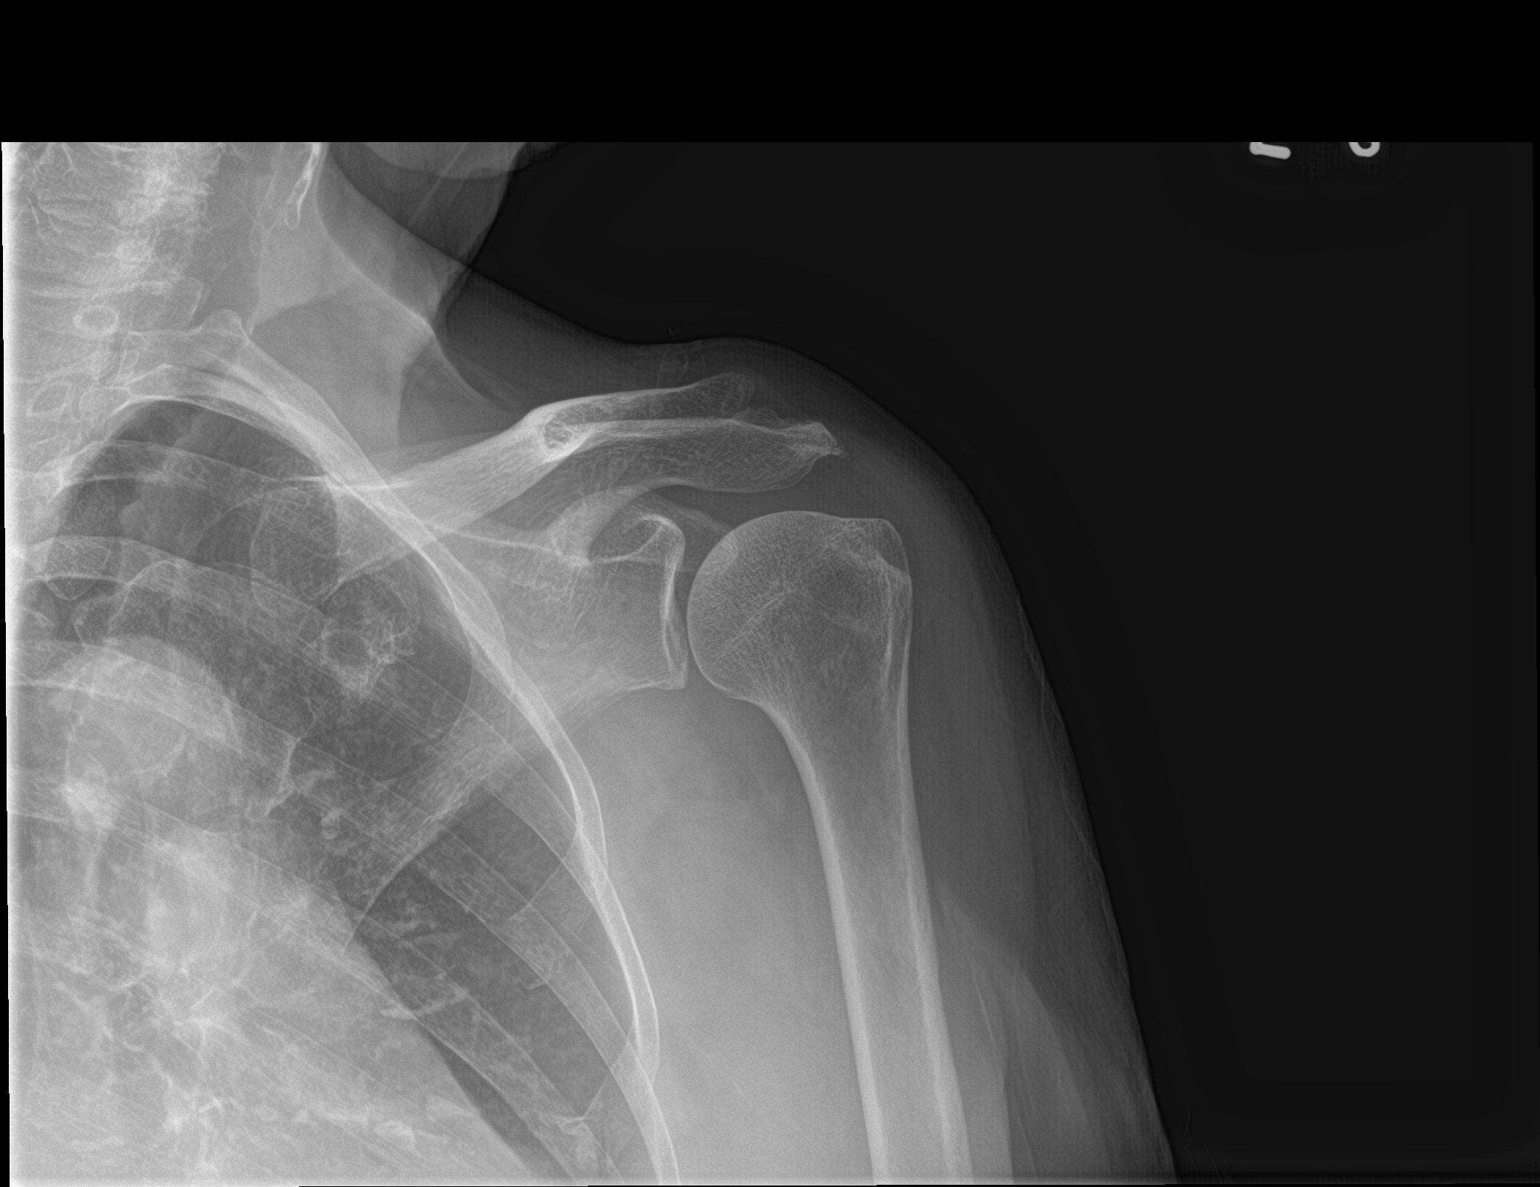

[shoulder y view]
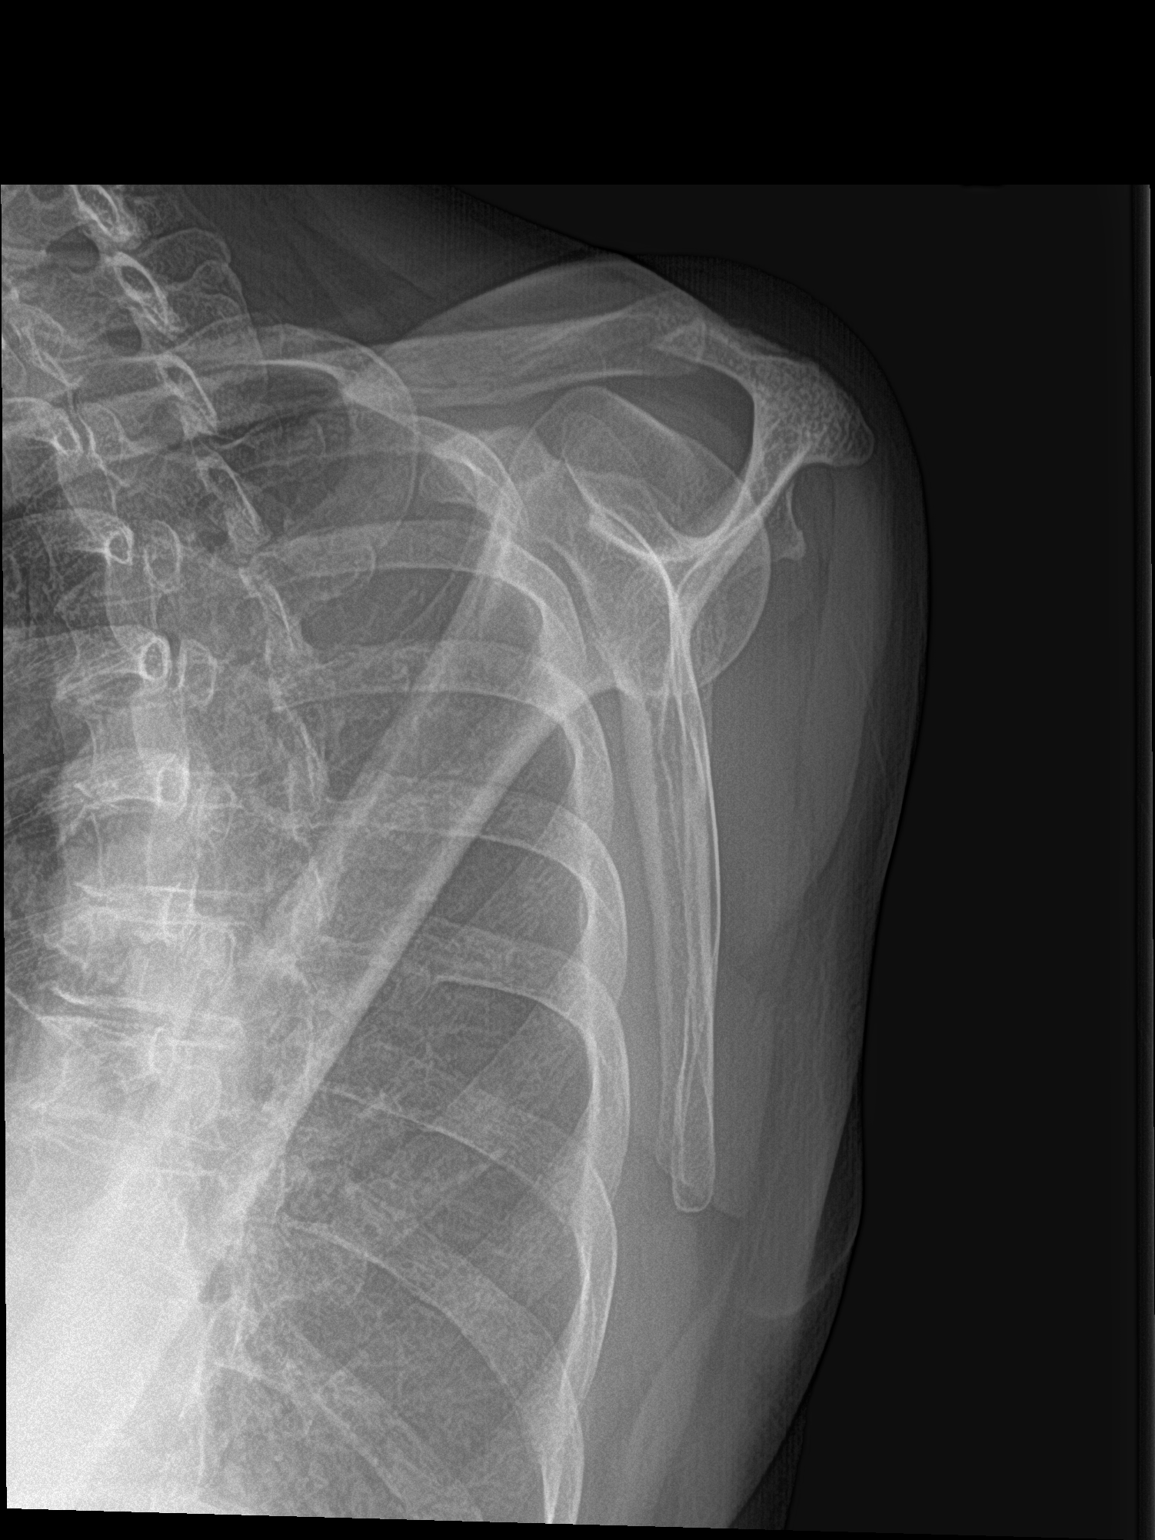

[shoulder axillary]
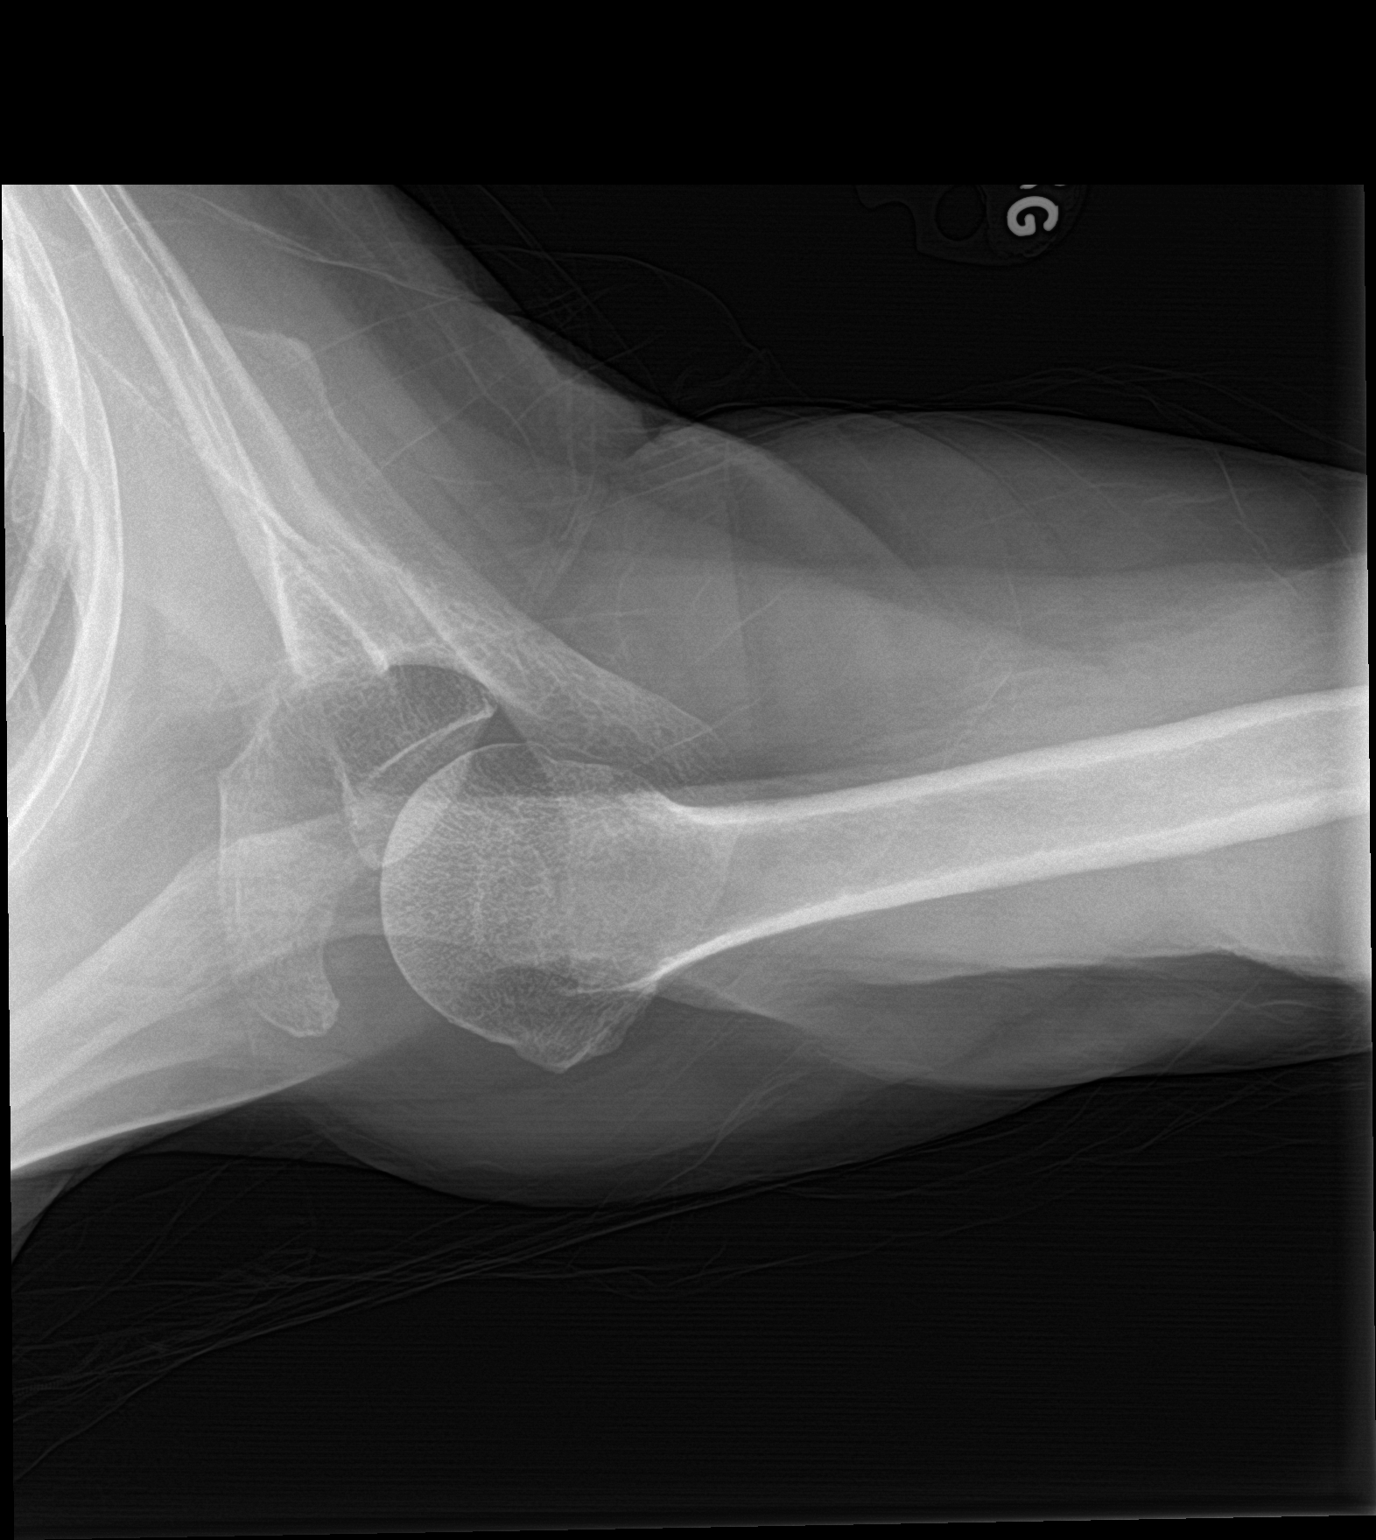

[3 of 3 positions shown; findings below may reference images not displayed]

FINDINGS: There is no evidence of fracture or dislocation. There is no
evidence of arthropathy or other focal bone abnormality. Soft
tissues are unremarkable.
IMPRESSION: No acute findings.

## 2018-01-31 MED ORDER — ACETAMINOPHEN 325 MG PO TABS
650.0000 mg | ORAL_TABLET | Freq: Once | ORAL | Status: AC
  Administered 2018-01-31: 650 mg via ORAL
  Filled 2018-01-31: qty 2

## 2018-01-31 NOTE — ED Triage Notes (Signed)
Patient states that she fell yesterday backward onto the grass - her grandson fell onto her and fell onto her left side rib cage and shoulder

## 2018-01-31 NOTE — ED Provider Notes (Signed)
[facility] EMERGENCY DEPARTMENT Provider Note   CSN: [id number] Arrival date & time: 01/31/18  1027     History   Chief Complaint Chief Complaint  Patient presents with  . Fall    HPI Leslie Ward is a 84 y.o. female.  HPI  84 year old female presents with left-sided chest pain and shoulder pain after a fall.  She was sitting in a chair when her grandson jumped into her lap causing the chair to go backwards.  He landed directly on her chest and now she is having the chest wall pain under her left breast and left shoulder pain.  Hurts to breathe.  She did not lose consciousness.  Has taken Tylenol with good relief of the pain but has come back.  No abdominal pain.  Past Medical History:  Diagnosis Date  . Diabetes mellitus without complication (HCC)   . Hypertension   . Thyroid disease     There are no active problems to display for this patient.   History reviewed. No pertinent surgical history.   OB History   None      Home Medications    Prior to Admission medications   Medication Sig Start Date End Date Taking? Authorizing Provider  amLODipine (NORVASC) 5 MG tablet Take 5 mg by mouth daily.    [provider]  Aspirin (ASPIR-81 PO) Take by mouth.    [provider]  LOSARTAN POTASSIUM PO Take by mouth.    [provider]  metFORMIN (GLUCOPHAGE) 500 MG tablet Take by mouth 2 (two) times daily with a meal.    [provider]  metoprolol tartrate (LOPRESSOR) 50 MG tablet Take 50 mg by mouth 2 (two) times daily.    [provider]  rosuvastatin (CRESTOR) 5 MG tablet Take 5 mg by mouth daily.    [provider]  triamcinolone cream (KENALOG) 0.1 % Apply 1 application topically 2 (two) times daily. 12/29/16   Cristina GongHammond, Elizabeth W, PA-C    Family History History reviewed. No pertinent family history.  Social History Social History   Tobacco Use  . Smoking status: Never Smoker  . Smokeless  tobacco: Never Used  Substance Use Topics  . Alcohol use: No  . Drug use: No     Allergies   Patient has no known allergies.   Review of Systems Review of Systems  Respiratory: Negative for shortness of breath.   Cardiovascular: Positive for chest pain.  Musculoskeletal: Positive for arthralgias.  Skin: Negative for wound.     Physical Exam Updated Vital Signs BP (!) 128/53 (BP Location: Left Arm)   Pulse 81   Temp 98.2 F (36.8 C) (Oral)   Resp 16   Ht 4\' 5"  (1.346 m)   Wt 51.7 kg (114 lb)   SpO2 97%   BMI 28.53 kg/m   Physical Exam  Constitutional: She appears well-developed and well-nourished. No distress.  HENT:  Head: Normocephalic and atraumatic.  Right Ear: External ear normal.  Left Ear: External ear normal.  Nose: Nose normal.  Eyes: Right eye exhibits no discharge. Left eye exhibits no discharge.  Cardiovascular: Normal rate, regular rhythm and normal heart sounds.  Pulmonary/Chest: Effort normal and breath sounds normal. She exhibits tenderness.    Abdominal: Soft. She exhibits no distension. There is no tenderness.  Musculoskeletal:       Left shoulder: She exhibits tenderness. She exhibits normal range of motion.  Neurological: She is alert.  Skin: Skin is warm and dry. She is  not diaphoretic.  Nursing note and vitals reviewed.    ED Treatments / Results  Labs (all labs ordered are listed, but only abnormal results are displayed) Labs Reviewed - No data to display  EKG None  Radiology Dg Ribs Unilateral W/chest Left  Result Date: 01/31/2018 CLINICAL DATA:  Fall yesterday with left shoulder and rib pain. EXAM: LEFT RIBS AND CHEST - 3+ VIEW COMPARISON:  11/10/2017 FINDINGS: Lungs are adequately inflated without consolidation, effusion or pneumothorax. Cardiomediastinal silhouette is within normal. No definite rib fracture. IMPRESSION: No acute findings. Electronically Signed   By: Elberta Fortis M.D.   On: 01/31/2018 11:39   Dg Shoulder  Left  Result Date: 01/31/2018 CLINICAL DATA:  Fall yesterday with left shoulder pain. EXAM: LEFT SHOULDER - 2+ VIEW COMPARISON:  None. FINDINGS: There is no evidence of fracture or dislocation. There is no evidence of arthropathy or other focal bone abnormality. Soft tissues are unremarkable. IMPRESSION: No acute findings. Electronically Signed   By: Elberta Fortis M.D.   On: 01/31/2018 11:39    Procedures Procedures (including critical care time)  Medications Ordered in ED Medications  acetaminophen (TYLENOL) tablet 650 mg (has no administration in time range)     Initial Impression / Assessment and Plan / ED Course  I have reviewed the triage vital signs and the nursing notes.  Pertinent labs & imaging results that were available during my care of the patient were reviewed by me and considered in my medical decision making (see chart for details).     Patient does not have any obvious rib fractures.  Her pain is controlled with Tylenol at home and thus I will give her this.  Full range of motion of her shoulder.  The shoulder seems to be worse than the ribs.  I will give her an incentive spirometer.  However she does not appears uncomfortable I think she needs narcotics at this time.  Follow-up with PCP.  Final Clinical Impressions(s) / ED Diagnoses   Final diagnoses:  Fall, initial encounter  Strain of left shoulder, initial encounter  Contusion of left chest wall, initial encounter    ED Discharge Orders    None       Pricilla Loveless, MD 01/31/18 1303

## 2018-12-16 ENCOUNTER — Ambulatory Visit (INDEPENDENT_AMBULATORY_CARE_PROVIDER_SITE_OTHER): Payer: Medicare Other | Admitting: Medical

## 2018-12-16 ENCOUNTER — Other Ambulatory Visit: Payer: Self-pay

## 2018-12-16 ENCOUNTER — Encounter: Payer: Self-pay | Admitting: Medical

## 2018-12-16 VITALS — BP 160/76 | HR 69

## 2018-12-16 DIAGNOSIS — M25512 Pain in left shoulder: Secondary | ICD-10-CM | POA: Diagnosis not present

## 2018-12-16 DIAGNOSIS — I10 Essential (primary) hypertension: Secondary | ICD-10-CM | POA: Diagnosis not present

## 2018-12-16 DIAGNOSIS — R739 Hyperglycemia, unspecified: Secondary | ICD-10-CM | POA: Diagnosis not present

## 2018-12-16 DIAGNOSIS — R944 Abnormal results of kidney function studies: Secondary | ICD-10-CM

## 2018-12-16 DIAGNOSIS — G8929 Other chronic pain: Secondary | ICD-10-CM | POA: Diagnosis not present

## 2018-12-16 MED ORDER — TRAMADOL HCL 50 MG PO TABS: 50.0000 mg | ORAL_TABLET | Freq: Two times a day (BID) | ORAL | 0 refills | Status: AC

## 2018-12-16 NOTE — Progress Notes (Signed)
Subjective:    Patient ID: Leslie Ward, female    DOB: 27-May-1931, 85 y.o.   MRN: [id number]  HPI  Virtual Visit via Video Note  I connected with Leslie Ward on 12/16/18 at 11:20 AM EDT by a video enabled telemedicine application and verified that I am speaking with the correct person using two identifiers.  Location: Patient: home Provider: home   I discussed the limitations of evaluation and management by telemedicine and the availability of in person appointments. The patient expressed understanding and agreed to proceed.  History of Present Illness:   Primary pain is rt shoulder. Pt states pain for months. She was trying to reach something in back seat of car, Pain was severe went to ED and went to ED. Since then can't lift her arm. Pt went to PT for 2 months but did not help. Pt states xray of rt shoulder was. She has tried various topical treatment. Some pain in her neck as well.  Pt also has htn. She is on 3 meds.   Last cpe was October last year.  Pt is losartan 100 mg.   Pt has hx of decreased gfr and elevated sugar. After metformin was discontinued.  Pt also has device implanted in her back with bowel and urinary incontinence.,   Observations/Objective: General-no acute distress. Lungs- unlabored breathing. Left shoulder- demonstrates reduced range of motion with pain.   Assessment and Plan: Patient history of chronic left shoulder pain that is exacerbated by movement.  I do not have her old x-rays or MRI but I am suspicious that she might have rotator cuff injury?.  Since the pain is chronic in nature and she is already been seen by physical therapy, I do think is a good idea for her to go ahead and be evaluated by sports medicine.  I placed sports medicine referral today and hopefully she will be seen within a week.  She does have elevated blood pressure today and advised her to avoid any over-the-counter NSAIDs.  For pain during the day advised she can  use Tylenol.  She describes nighttime with more severe pain and when she changes position/rolls over in bed shoulder hurts worse.  So for nighttime pain prescribed tramadol.  Rx advisement given.  For better blood pressure control, advised continue current BP medications but increase amlodipine to 10mg  a day.  For history of decreased GFR and elevated sugar, I placed future labs.  I sent message to her reception staff to coordinate getting labs done on the same day as sports medicine appointment.  Also asked them to get patient to sign release of information form as well as get her placed on DPR form.  Follow Up Instructions:    I discussed the assessment and treatment plan with the patient. The patient was provided an opportunity to ask questions and all were answered. The patient agreed with the plan and demonstrated an understanding of the instructions.   The patient was advised to call back or seek an in-person evaluation if the symptoms worsen or if the condition fails to improve as anticipated.  45 minutes spent with new pt. 50% of time spent explaining treatment plans, referral process, lab appointments as well as answering all question.    [name], PA-C    Review of Systems  Constitutional: Negative for chills, fatigue and fever.  Respiratory: Negative for cough, chest tightness, shortness of breath and wheezing.   Cardiovascular: Negative for chest pain and palpitations.  Gastrointestinal: Negative for abdominal  pain, blood in stool and constipation.  Musculoskeletal: Negative for back pain.       Shoulder pain rt side   Neurological: Negative for dizziness, syncope, speech difficulty, weakness and headaches.  Hematological: Negative for adenopathy. Does not bruise/bleed easily.  Psychiatric/Behavioral: Negative for behavioral problems, decreased concentration and hallucinations.       Objective:   Physical Exam        Assessment & Plan:  Leslie Ward [phone]

## 2018-12-16 NOTE — Patient Instructions (Signed)
Patient history of chronic left shoulder pain that is exacerbated by movement.  I do not have her old x-rays or MRI but I am suspicious that she might have rotator cuff injury?.  Since the pain is chronic in nature and she is already been seen by physical therapy, I do think is a good idea for her to go ahead and be evaluated by sports medicine.  I placed sports medicine referral today and hopefully she will be seen within a week.  She does have elevated blood pressure today and advised her to avoid any over-the-counter NSAIDs.  For pain during the day advised she can use Tylenol.  She describes nighttime with more severe pain and when she changes position/rolls over in bed shoulder hurts worse.  So for nighttime pain prescribed tramadol.  Rx advisement given.  For better blood pressure control, advised continue current BP medications but increase amlodipine to 10mg  a day.  For history of decreased GFR and elevated sugar, I placed future labs.  I sent message to her reception staff to coordinate getting labs done on the same day as sports medicine appointment.  Also asked them to get patient to sign release of information form as well as get her placed on DPR form.

## 2018-12-18 ENCOUNTER — Telehealth: Payer: Self-pay

## 2018-12-18 NOTE — Telephone Encounter (Signed)
Copied from CRM [id number]. Topic: General - Other >> Dec 17, 2018  3:41 PM Daleisa Bien wrote: Reason for CRM: pt son called and stated that he would like to have patients blood work done the same time he come in for sports medicine appointment. Pt son did not know what exact labs [facility] needed done. Pt would like a call back regarding. Please advise

## 2018-12-22 ENCOUNTER — Other Ambulatory Visit: Payer: Self-pay

## 2018-12-22 ENCOUNTER — Ambulatory Visit: Payer: Self-pay

## 2018-12-22 ENCOUNTER — Telehealth: Payer: Self-pay | Admitting: Medical

## 2018-12-22 ENCOUNTER — Encounter: Payer: Self-pay | Admitting: Medical

## 2018-12-22 ENCOUNTER — Ambulatory Visit (INDEPENDENT_AMBULATORY_CARE_PROVIDER_SITE_OTHER): Payer: Medicare Other | Admitting: Medical

## 2018-12-22 ENCOUNTER — Other Ambulatory Visit (INDEPENDENT_AMBULATORY_CARE_PROVIDER_SITE_OTHER): Payer: Medicare Other

## 2018-12-22 ENCOUNTER — Encounter (INDEPENDENT_AMBULATORY_CARE_PROVIDER_SITE_OTHER): Payer: Self-pay

## 2018-12-22 ENCOUNTER — Ambulatory Visit (INDEPENDENT_AMBULATORY_CARE_PROVIDER_SITE_OTHER): Payer: Medicare Other | Admitting: Family Medicine

## 2018-12-22 VITALS — BP 166/70 | HR 67

## 2018-12-22 VITALS — BP 184/84 | HR 69 | Ht 59.0 in | Wt 116.0 lb

## 2018-12-22 DIAGNOSIS — I10 Essential (primary) hypertension: Secondary | ICD-10-CM

## 2018-12-22 DIAGNOSIS — G8929 Other chronic pain: Secondary | ICD-10-CM

## 2018-12-22 DIAGNOSIS — M7502 Adhesive capsulitis of left shoulder: Secondary | ICD-10-CM | POA: Insufficient documentation

## 2018-12-22 DIAGNOSIS — R739 Hyperglycemia, unspecified: Secondary | ICD-10-CM

## 2018-12-22 DIAGNOSIS — M25512 Pain in left shoulder: Secondary | ICD-10-CM

## 2018-12-22 DIAGNOSIS — R944 Abnormal results of kidney function studies: Secondary | ICD-10-CM

## 2018-12-22 HISTORY — DX: Adhesive capsulitis of left shoulder: M75.02

## 2018-12-22 LAB — COMPREHENSIVE METABOLIC PANEL
ALT: 11 U/L (ref 0–35)
AST: 15 U/L (ref 0–37)
Albumin: 4 g/dL (ref 3.5–5.2)
Alkaline Phosphatase: 59 U/L (ref 39–117)
BUN: 15 mg/dL (ref 6–23)
CO2: 30 mEq/L (ref 19–32)
Calcium: 9.1 mg/dL (ref 8.4–10.5)
Chloride: 103 mEq/L (ref 96–112)
Creatinine, Ser: 0.83 mg/dL (ref 0.40–1.20)
GFR: 65.15 mL/min (ref >60.00)
Glucose, Bld: 102 mg/dL — ABNORMAL HIGH (ref 70–99)
Potassium: 4.2 mEq/L (ref 3.5–5.1)
Sodium: 140 mEq/L (ref 135–145)
Total Bilirubin: 1 mg/dL (ref 0.2–1.2)
Total Protein: 7.2 g/dL (ref 6.0–8.3)

## 2018-12-22 LAB — HEMOGLOBIN A1C: Hgb A1c MFr Bld: 6.4 % (ref 4.6–6.5)

## 2018-12-22 MED ORDER — METHYLPREDNISOLONE ACETATE 40 MG/ML IJ SUSP
40.0000 mg | Freq: Once | INTRAMUSCULAR | Status: AC
  Administered 2018-12-22: 10:00:00 40 mg via INTRA_ARTICULAR

## 2018-12-22 NOTE — Telephone Encounter (Signed)
Lab order placed.

## 2018-12-22 NOTE — Telephone Encounter (Signed)
Would you update pt med list losartan 100 mg daily. Amlodipine 10 mg q day

## 2018-12-22 NOTE — Patient Instructions (Addendum)
Your blood pressure is high today but you have not taken your bp meds. Continue same regimen and will follow your labs done today. Check kidney function. Also check bp daily and update me on Friday by my chart on your daily readings. If bp greater than 140/90 then may add low dose diuretic or other but need to review labs first.  Also low salt diet recommended.  For hx of elevated sugar will follow your a1c and update you.  Follow up date to be determined after lab review

## 2018-12-22 NOTE — Telephone Encounter (Signed)
Pt here today. Discussed w/ Ramon Dredge.

## 2018-12-22 NOTE — Patient Instructions (Signed)
Nice to meet you  Please try the exercises  Please try putting heat on the shoulder before performing the exercises Please send me a message in MyChart with any question or updates.  Please see me back in 4-6 weeks.

## 2018-12-22 NOTE — Assessment & Plan Note (Signed)
Symptoms are related to frozen shoulder.  Less likely for rotator cuff or degenerative changes of the glenohumeral joint. -Glenohumeral injection today. -Counseled on home exercise therapy and supportive care. -Can follow-up in 4 to 6 weeks.  Could consider physical therapy at that time.

## 2018-12-22 NOTE — Progress Notes (Addendum)
   Subjective:    Patient ID: Leslie Ward, female    DOB: 1930/08/21, 85 y.o.   MRN: [id number]  HPI  Virtual Visit via Video Note  I connected with Leslie Ward on 12/22/18 at 10:40 AM EDT by a video enabled telemedicine application and verified that I am speaking with the correct person using two identifiers.  Location: Patient: home Provider: home   I discussed the limitations of evaluation and management by telemedicine and the availability of in person appointments. The patient expressed understanding and agreed to proceed.  History of Present Illness:  Pt has hx of htn. Her meds list has lopressor 50 ng twice a day, amlodipine 10(as had advised to take 2 of 5 mg tabs) and losartan 100 mg tab.  She usually takes bp medication 9 am but has not taken the medication yet today.  Pt bp came down to 166/70. Was higher at sport medicine MD.  Sunday her bp 140 but son can't remember diastolic reading.  Pt had some faint ha early today   Observations/Objective: General- no acute distress. Pleasant. Lungs- appears to breath unlabore Neuro- gross motor function.  Assessment and Plan: Your blood pressure is high today but you have not taken your bp meds. Continue same regimen and will follow your labs done today. Check kidney function. Also check bp daily and update me on Friday by my chart on your daily readings. If bp greater than 140/90 then may add low dose diuretic or other but need to review labs first.  Also low salt diet recommended.  For hx of elevated sugar will follow your a1c and update you.  Follow up date to be determined after lab review  [name], PA-C  Follow Up Instructions:    I discussed the assessment and treatment plan with the patient. The patient was provided an opportunity to ask questions and all were answered. The patient agreed with the plan and demonstrated an understanding of the instructions.   The patient was advised to call back  or seek an in-person evaluation if the symptoms worsen or if the condition fails to improve as anticipated.     [name], PA-C   Review of Systems  Constitutional: Negative for chills, fatigue and fever.  Respiratory: Negative for cough, chest tightness, shortness of breath and wheezing.   Cardiovascular: Negative for chest pain and palpitations.  Gastrointestinal: Negative for abdominal pain.  Neurological: Negative for dizziness, speech difficulty, weakness and light-headedness.  Hematological: Negative for adenopathy.  Psychiatric/Behavioral: Negative for behavioral problems.       Objective:   Physical Exam        Assessment & Plan:

## 2018-12-22 NOTE — Progress Notes (Signed)
Leslie Ward - 85 y.o. female MRN [id number]  Date of birth: October 20, 1930  SUBJECTIVE:  Including CC & ROS.  No chief complaint on file.   Leslie Ward is a 86 y.o. female that is presenting with acute on chronic left shoulder pain.  It is been occurring for roughly 6 months.  She noticed the pain initially after reaching into the backseat and her grandson pulled on her arm.  Since that time the pain is been constant and moderate to severe.  She has pain with lying on the affected side.  She is not had any improvement with modalities to date.  Is localized to the shoulder.  No history of surgery.  Feels like the pain is staying the same.  No radicular symptoms.  No numbness or tingling..  Independent review of the left shoulder x-ray from 2019 shows no significant glenohumeral degenerative changes.   Review of Systems  Constitutional: Negative for fever.  HENT: Negative for congestion.   Respiratory: Negative for cough.   Cardiovascular: Negative for chest pain.  Gastrointestinal: Negative for abdominal pain.  Musculoskeletal: Positive for arthralgias.  Skin: Negative for color change.  Neurological: Negative for weakness.  Hematological: Negative for adenopathy.    HISTORY: Past Medical, Surgical, Social, and Family History Reviewed & Updated per EMR.   Pertinent Historical Findings include:  Past Medical History:  Diagnosis Date  . Diabetes mellitus without complication (HCC)   . Hypertension   . Thyroid disease     No past surgical history on file.  No Known Allergies  No family history on file.   Social History   Socioeconomic History  . Marital status: Single    Spouse name: Not on file  . Number of children: Not on file  . Years of education: Not on file  . Highest education level: Not on file  Occupational History  . Not on file  Social Needs  . Financial resource strain: Not on file  . Food insecurity:    Worry: Not on file    Inability: Not on file   . Transportation needs:    Medical: Not on file    Non-medical: Not on file  Tobacco Use  . Smoking status: Never Smoker  . Smokeless tobacco: Never Used  Substance and Sexual Activity  . Alcohol use: No  . Drug use: No  . Sexual activity: Not Currently  Lifestyle  . Physical activity:    Days per week: Not on file    Minutes per session: Not on file  . Stress: Not on file  Relationships  . Social connections:    Talks on phone: Not on file    Gets together: Not on file    Attends religious service: Not on file    Active member of club or organization: Not on file    Attends meetings of clubs or organizations: Not on file    Relationship status: Not on file  . Intimate partner violence:    Fear of current or ex partner: Not on file    Emotionally abused: Not on file    Physically abused: Not on file    Forced sexual activity: Not on file  Other Topics Concern  . Not on file  Social History Narrative  . Not on file     PHYSICAL EXAM:  VS: BP (!) 184/84   Pulse 69   Ht 4\' 11"  (1.499 m)   Wt 116 lb (52.6 kg)   BMI 23.43 kg/m  Physical Exam Gen:  NAD, alert, cooperative with exam, well-appearing ENT: normal lips, normal nasal mucosa,  Eye: normal EOM, normal conjunctiva and lids CV:  no edema, +2 pedal pulses   Resp: no accessory muscle use, non-labored,  Skin: no rashes, no areas of induration  Neuro: normal tone, normal sensation to touch Psych:  normal insight, alert and oriented MSK:  Left shoulder: Limited active range of motion in flexion and abduction. Limited external rotation. Normal strength resistance with internal and external rotation. Normal empty can testing. Normal grip strength. Neurovascular intact   Aspiration/Injection Procedure Note Leslie ModeFanny Ward 17-Jun-1931  Procedure: Injection Indications: Left shoulder pain  Procedure Details Consent: Risks of procedure as well as the alternatives and risks of each were explained to the  (patient/caregiver).  Consent for procedure obtained. Time Out: Verified patient identification, verified procedure, site/side was marked, verified correct patient position, special equipment/implants available, medications/allergies/relevent history reviewed, required imaging and test results available.  Performed.  The area was cleaned with iodine and alcohol swabs.    The left glenohumeral joint was injected using 1 cc's of 40 mg Depo-Medrol and 7 cc's of 0.5% Sensorcaine with a 22 1 1/2" needle.  Ultrasound was used. Images were obtained in trans views showing the injection.     A sterile dressing was applied.  Patient did tolerate procedure well.        ASSESSMENT & PLAN:   Adhesive capsulitis of left shoulder Symptoms are related to frozen shoulder.  Less likely for rotator cuff or degenerative changes of the glenohumeral joint. -Glenohumeral injection today. -Counseled on home exercise therapy and supportive care. -Can follow-up in 4 to 6 weeks.  Could consider physical therapy at that time.

## 2018-12-23 ENCOUNTER — Encounter: Payer: Self-pay | Admitting: Medical

## 2018-12-23 ENCOUNTER — Telehealth: Payer: Self-pay | Admitting: Medical

## 2018-12-23 MED ORDER — HYDROCHLOROTHIAZIDE 12.5 MG PO CAPS: 12.5000 mg | ORAL_CAPSULE | Freq: Every day | ORAL | 3 refills | Status: DC

## 2018-12-23 NOTE — Telephone Encounter (Signed)
Rx hctz sent to pt pharmacy. 

## 2018-12-26 ENCOUNTER — Encounter: Payer: Self-pay | Admitting: Medical

## 2018-12-29 ENCOUNTER — Encounter: Payer: Self-pay | Admitting: Medical

## 2018-12-30 ENCOUNTER — Telehealth: Payer: Self-pay | Admitting: Medical

## 2018-12-30 DIAGNOSIS — R159 Full incontinence of feces: Secondary | ICD-10-CM

## 2018-12-30 NOTE — Telephone Encounter (Signed)
Referral to gi placed. °

## 2019-01-13 ENCOUNTER — Encounter: Payer: Self-pay | Admitting: Medical

## 2019-01-13 ENCOUNTER — Other Ambulatory Visit: Payer: Self-pay

## 2019-01-13 ENCOUNTER — Ambulatory Visit (INDEPENDENT_AMBULATORY_CARE_PROVIDER_SITE_OTHER): Payer: Medicare Other | Admitting: Medical

## 2019-01-13 ENCOUNTER — Telehealth: Payer: Self-pay | Admitting: Medical

## 2019-01-13 VITALS — BP 133/70 | HR 63

## 2019-01-13 DIAGNOSIS — S46812A Strain of other muscles, fascia and tendons at shoulder and upper arm level, left arm, initial encounter: Secondary | ICD-10-CM

## 2019-01-13 DIAGNOSIS — I10 Essential (primary) hypertension: Secondary | ICD-10-CM | POA: Diagnosis not present

## 2019-01-13 MED ORDER — CYCLOBENZAPRINE HCL 5 MG PO TABS: 5.0000 mg | ORAL_TABLET | Freq: Every day | ORAL | 0 refills | Status: DC

## 2019-01-13 NOTE — Patient Instructions (Addendum)
Your bp does tend to run a little high and does spike some occasionally. Recommend continuing current regimen and start back on hctz daily. This will give lower lows and minimize any dramatic occasional spikes. Your did mention interest in getting cardiologist opinion about bp variability and occasional spikes. Will refer you to cardiologist at your request.  Your neck pain appears to be trapezius muscle pain/strain. You did report improvement with tylenol and warm compresses. Will try low dose flexeril at night. Rx advisement given and try voltaren over the counter twice daily to trapezius/occiptal junction.  If pain in neck persists consider sportsmed referral.  Follow up in one week or as needed. Please keep record of bp readings while adding back hctz.

## 2019-01-13 NOTE — Progress Notes (Signed)
Subjective:    Patient ID: Leslie Ward, female    DOB: July 10, 1931, 85 y.o.   MRN: [id number]  HPI   Virtual Visit via Video Note  I connected with [name] on 01/13/19 at  1:20 PM EDT by a video enabled telemedicine application and verified that I am speaking with the correct person using two identifiers.  Location: Patient: home Provider: home   I discussed the limitations of evaluation and management by telemedicine and the availability of in person appointments. The patient expressed understanding and agreed to proceed.  History of Present Illness:  Pain on her rt trapezius area since Sunday. Pain worse at junction of neck. Hurts to move her head to rt side and left side. Worse pain on turning head to left. Tylenol earlier in week helped and warm compress did seem to help as well. Notes recent exercises for shoulders.  Pt had some bp variation all over the place per son. Pt currently 175/78. Her pulse is 63.  On review her pulse is always in 60 range. No episodes of shaking or sweaty with bp elevation spikes. No correlation of anxiety and bp increase. Example last night systolic was 195 and had no symptoms. Pt has not been taking the hctz 12.5 mg as I wanted. Son gave her hctz and her bp came down to 155/unkown systolic. Pt son give me readings over pst 148/65, 135/60, 159/85, 152/74, 137/63, 166/79, 144/62, 152/93, 155/69, 162/67, 148/71, 156/69, 132/61.   Pt brother is cardiologist and he thinks she should be on just one medications.      Observations/Objective: General-no acute distress, pleasant, oriented. Lungs- on inspection lungs appear unlabored. Neck- no tracheal deviation or jvd on inspection. Neuro- gross motor function appears intact.  Assessment and Plan: Your bp does tend to run a little high and does spike some occasionally. Recommend continuing current regimen and start back on hctz daily. This will give lower lows and minimize any dramatic  occasional spikes. Your did mention interest in getting cardiologist opinion about bp variability and occasional spikes. Will refer you to cardiologist at your request.  Your neck pain appears to be trapezius muscle pain/strain. You did report improvement with tylenol and warm compresses. Will try low dose flexeril at night. Rx advisement given and try voltaren over the counter twice daily to trapezius/occiptal junction.  If pain in neck persists consider sportsmed referral.  Follow up in one week or as needed. Please keep record of bp readings while adding back hctz.  [name], PA-C  Follow Up Instructions:    I discussed the assessment and treatment plan with the patient. The patient was provided an opportunity to ask questions and all were answered. The patient agreed with the plan and demonstrated an understanding of the instructions.   The patient was advised to call back or seek an in-person evaluation if the symptoms worsen or if the condition fails to improve as anticipated.  25 minute spent with pt. 50% of time spent on explaining plan going forward for conditions today. Also answered pt and son questions.   [name], PA-C    Review of Systems  Constitutional: Negative for activity change, chills, diaphoresis, fatigue and fever.  Respiratory: Negative for cough, chest tightness and shortness of breath.   Cardiovascular: Negative for chest pain, palpitations and leg swelling.  Gastrointestinal: Negative for abdominal pain, nausea and vomiting.  Musculoskeletal: Positive for neck pain. Negative for neck stiffness.       Trapezius pain.  Skin:  Negative for rash.  Neurological: Negative for dizziness, speech difficulty, weakness and headaches.  Psychiatric/Behavioral: Negative for agitation, behavioral problems and confusion. The patient is not nervous/anxious.    Past Medical History:  Diagnosis Date  . Diabetes mellitus without complication (HCC)   .  Hypertension   . Thyroid disease      Social History   Socioeconomic History  . Marital status: Single    Spouse name: Not on file  . Number of children: Not on file  . Years of education: Not on file  . Highest education level: Not on file  Occupational History  . Not on file  Social Needs  . Financial resource strain: Not on file  . Food insecurity:    Worry: Not on file    Inability: Not on file  . Transportation needs:    Medical: Not on file    Non-medical: Not on file  Tobacco Use  . Smoking status: Never Smoker  . Smokeless tobacco: Never Used  Substance and Sexual Activity  . Alcohol use: No  . Drug use: No  . Sexual activity: Not Currently  Lifestyle  . Physical activity:    Days per week: Not on file    Minutes per session: Not on file  . Stress: Not on file  Relationships  . Social connections:    Talks on phone: Not on file    Gets together: Not on file    Attends religious service: Not on file    Active member of club or organization: Not on file    Attends meetings of clubs or organizations: Not on file    Relationship status: Not on file  . Intimate partner violence:    Fear of current or ex partner: Not on file    Emotionally abused: Not on file    Physically abused: Not on file    Forced sexual activity: Not on file  Other Topics Concern  . Not on file  Social History Narrative  . Not on file    No past surgical history on file.  No family history on file.  No Known Allergies  Current Outpatient Medications on File Prior to Visit  Medication Sig Dispense Refill  . amLODipine (NORVASC) 5 MG tablet Take 5 mg by mouth daily.    . Aspirin (ASPIR-81 PO) Take by mouth.    . hydrochlorothiazide (MICROZIDE) 12.5 MG capsule Take 1 capsule (12.5 mg total) by mouth daily. 30 capsule 3  . levothyroxine (SYNTHROID) 75 MCG tablet Take by mouth.    [name]. LOSARTAN POTASSIUM PO Take by mouth.    . metoprolol tartrate (LOPRESSOR) 50 MG tablet Take 50 mg by  mouth 2 (two) times daily.    [name]. triamcinolone cream (KENALOG) 0.1 % Apply 1 application topically 2 (two) times daily. 30 g 0   No current facility-administered medications on file prior to visit.     BP 133/70 Comment: about 20 minutes ealier was 175/78  Pulse 63       Objective:   Physical Exam        Assessment & Plan:

## 2019-01-14 NOTE — Telephone Encounter (Signed)
Opened to review 

## 2019-01-19 ENCOUNTER — Ambulatory Visit (INDEPENDENT_AMBULATORY_CARE_PROVIDER_SITE_OTHER): Payer: Medicare Other | Admitting: Family Medicine

## 2019-01-19 ENCOUNTER — Other Ambulatory Visit: Payer: Self-pay

## 2019-01-19 ENCOUNTER — Encounter: Payer: Self-pay | Admitting: Family Medicine

## 2019-01-19 VITALS — BP 169/76 | HR 68 | Ht 59.0 in | Wt 116.0 lb

## 2019-01-19 DIAGNOSIS — G249 Dystonia, unspecified: Secondary | ICD-10-CM | POA: Diagnosis not present

## 2019-01-19 DIAGNOSIS — M7502 Adhesive capsulitis of left shoulder: Secondary | ICD-10-CM

## 2019-01-19 DIAGNOSIS — M542 Cervicalgia: Secondary | ICD-10-CM

## 2019-01-19 DIAGNOSIS — I10 Essential (primary) hypertension: Secondary | ICD-10-CM | POA: Insufficient documentation

## 2019-01-19 DIAGNOSIS — E119 Type 2 diabetes mellitus without complications: Secondary | ICD-10-CM | POA: Insufficient documentation

## 2019-01-19 DIAGNOSIS — E039 Hypothyroidism, unspecified: Secondary | ICD-10-CM

## 2019-01-19 HISTORY — DX: Type 2 diabetes mellitus without complications: E11.9

## 2019-01-19 HISTORY — DX: Hypothyroidism, unspecified: E03.9

## 2019-01-19 HISTORY — DX: Cervicalgia: M54.2

## 2019-01-19 NOTE — Assessment & Plan Note (Addendum)
Has had improvement with her range of motion.  Has had improvement with the pain as well.  Has been aggressive with her home exercises.  This may have contributed to the neck pain. -Referral to physical therapy. -Continue with home exercise therapy and supportive care. -Follow-up in 6 weeks.

## 2019-01-19 NOTE — Progress Notes (Signed)
Cardiology Office Note:    Date:  01/20/2019   ID:  Leslie Ward, DOB 04/01/1931, MRN [id number]  PCP:  Esperanza RichtersSaguier, Edward, PA-C  Cardiologist:  Norman HerrlichBrian , MD   Referring MD: Esperanza RichtersSaguier, Edward, PA-C  ASSESSMENT:    1. Essential hypertension   2. Type 2 diabetes mellitus without complication, without long-term current use of insulin (HCC)   3. Hypothyroidism (acquired)    PLAN:    In order of problems listed above:  1. HTN -presently in range I think she requires multiple agents and negotiated to take a minimum dose of thiazide will give her a more potent ARB and reassess in 4 weeks and try to down titrate and perhaps use combination of she will take the thiazide diuretic daily check renal function and lipid profile today we discussed tips for correctly and optimally measuring blood pressure and she will continue to record at home and sodium restrict 2. DM2 -stable not requiring lipid-lowering therapy 3. Hypothyroidism -continue her thyroid supplement 4. Check lipid profile  Next appointment 4 weeks virtual   Medication Adjustments/Labs and Tests Ordered: Current medicines are reviewed at length with the patient today.  Concerns regarding medicines are outlined above.  No orders of the defined types were placed in this encounter.  No orders of the defined types were placed in this encounter.    Chief Complaint  Patient presents with  . Hypertension    History of Present Illness:    Leslie Ward is a 85 y.o. female who is being seen today for the evaluation of hypertension at the request of Saguier, Ramon Dred[place], [place]. She has PMH DM2, HTN, hypothyroidism. Seen by Esperanza RichtersSaguier, Edward PA 01/13/19 for neck pain and HTN. HTN was noted to be variable and have occasional spikes. She was seen 01/19/19 by sports medicine and prescribed PT and neurology referral.   Labs 12/22/18: K 4.2 Creatinine 0.83 GFR 65 AST 15 ALT 11 A1C 6.4  She is fluent in [place] and her son who is involved in her  care is present.  Recent blood pressures have been in target range 1 20-1 40 systolic 70-80 she was prescribed hydrochlorothiazide but she only took a single tablet.  She checks her blood pressure 3 times a day arm cuff and uses 2 different devices.  To streamline we have asked her to use a single device check twice a day after she takes medications and in the early evening to record and to wait 5 minutes of rest.  Her concern is she has a brother who still practices cardiology and internal medicine in [place] and he is not pleased with her medications.  He thinks she should take a single antihypertensive drug.  Her son tells me she has had hypertension perhaps 5 years she has had systolics of greater than 180 and I told him I do not think it is an option to control her on a single agent I negotiated with him that she take her thiazide diuretic 2 days a week I will transition her to a more potent ARB telmisartan and for now continue her amlodipine and metoprolol when seen in 4 weeks try to titrate her back to 3 antihypertensive agents.  She fatigues easily but does not have shortness of breath chest pain palpitation or syncope he has no history of vascular disease CAD or heart failure or valvular heart disease.  Sodium restricts.  She has no recent lipid profile and I will assess it today her diabetes is controlled at this time without  medication  Past Medical History:  Diagnosis Date  . Diabetes mellitus without complication ([place])   . Hypertension   . Thyroid disease     Past Surgical History:  Procedure Laterality Date  . NO PAST SURGERIES      Current Medications: Current Meds  Medication Sig  . amLODipine (NORVASC) 5 MG tablet Take 5 mg by mouth 2 (two) times a day.   . Calcium Carbonate (CALCIUM 600 PO) Take 2 tablets by mouth daily.  . Calcium Polycarbophil (FIBER-CAPS PO) Take 1 capsule by mouth daily.  . Cinnamon 500 MG capsule Take 1,000 mg by mouth daily.  . hydrochlorothiazide  (MICROZIDE) 12.5 MG capsule Take 12.5 mg by mouth daily as needed.  [name] levothyroxine (SYNTHROID) 75 MCG tablet Take 75 mcg by mouth daily before breakfast.   . MELATONIN PO Take 2-3 tablets by mouth at bedtime as needed.  . Menaquinone-7 (VITAMIN K2 PO) Take 1 tablet by mouth daily.  . metoprolol tartrate (LOPRESSOR) 50 MG tablet Take 50 mg by mouth 2 (two) times daily.  . Multiple Vitamin (MULTIVITAMIN) tablet Take 1 tablet by mouth daily.  . [place]- 1 tablet daily  . Omega-3 Fatty Acids (OMEGA 3 PO) Take 2 tablets by mouth daily.  . Probiotic Product (PROBIOTIC DAILY PO) Take 1 tablet by mouth daily.  . traMADol (ULTRAM) 50 MG tablet Take 1 tablet by mouth daily as needed.  . [DISCONTINUED] losartan (COZAAR) 100 MG tablet TK 1 T PO QD     Allergies:   Patient has no known allergies.   Social History   Socioeconomic History  . Marital status: Single    Spouse name: Not on file  . Number of children: Not on file  . Years of education: Not on file  . Highest education level: Not on file  Occupational History  . Not on file  Social Needs  . Financial resource strain: Not on file  . Food insecurity:    Worry: Not on file    Inability: Not on file  . Transportation needs:    Medical: Not on file    Non-medical: Not on file  Tobacco Use  . Smoking status: Never Smoker  . Smokeless tobacco: Never Used  Substance and Sexual Activity  . Alcohol use: No  . Drug use: No  . Sexual activity: Not Currently  Lifestyle  . Physical activity:    Days per week: Not on file    Minutes per session: Not on file  . Stress: Not on file  Relationships  . Social connections:    Talks on phone: Not on file    Gets together: Not on file    Attends religious service: Not on file    Active member of club or organization: Not on file    Attends meetings of clubs or organizations: Not on file    Relationship status: Not on file  Other Topics Concern  . Not on file  Social  History Narrative  . Not on file     Family History: The patient's family history includes Heart Problems in her brother; Heart attack in her father; Stroke in her mother.  ROS:   Review of Systems  Constitution: Positive for malaise/fatigue.  HENT: Negative.   Eyes: Negative.   Cardiovascular: Negative.   Respiratory: Negative.   Endocrine: Negative.   Hematologic/Lymphatic: Negative.   Skin: Negative.   Musculoskeletal: Positive for joint pain and neck pain.  Gastrointestinal: Negative.   Genitourinary: Negative.  Neurological: Negative.   Psychiatric/Behavioral: Negative.   Allergic/Immunologic: Negative.    Please see the history of present illness.     All other systems reviewed and are negative.  EKGs/Labs/Other Studies Reviewed:    The following studies were reviewed today:  Echo 08/15/2014: Result Narrative   Left Ventricle: Grade II (Pseudo-normal) abnormal left ventricular  diastolic dysfunction.  Aortic Valve: No regurgitation is present.  Mitral Valve: Trace regurgitation.  Pericardium: no pericardial effusion.  Pulmonic Valve: There is mild pulmonary hypertension.  Tricuspid Valve: Mild regurgitation.  IVC: diameter is < 21 mm with a decrease of > 50 % sniff reduction  Estimated right atrial pressure is 5 mmHg.     EKG 03/12/2018:  Result Narrative  Ventricular Rate 76 BPM Atrial Rate 76 BPM P-R Interval 152 ms QRS Duration 70 ms Q-T Interval 372 ms QTC Calculation(Bezet) 418 ms P Axis 74 degrees R Axis 24 degrees T Axis 22 degrees DIAGNOSIS Sinus rhythm with Premature atrial complexes  DIAGNOSIS Nonspecific ST abnormality  DIAGNOSIS Abnormal ECG  DIAGNOSIS When compared with ECG of 18-Feb-2006 20:08,  DIAGNOSIS Premature atrial complexes are now Present  DIAGNOSIS Criteria for Septal infarct are no longer Present    EKG:  EKG is  ordered today.  The ekg ordered today is personally reviewed and demonstrates shows sinus rhythm and the  EKG is normal  Recent Labs: 12/22/2018: ALT 11; BUN 15; Creatinine, Ser 0.83; Potassium 4.2; Sodium 140  Recent Lipid Panel No results found for: CHOL, TRIG, HDL, CHOLHDL, VLDL, LDLCALC, LDLDIRECT  Physical Exam:    VS:  BP 132/64 (BP Location: Right Arm, Patient Position: Sitting, Cuff Size: Normal)   Pulse 65   Temp (!) 97.2 F (36.2 C)   Ht 4\' 11"  (1.499 m)   Wt 120 lb (54.4 kg)   SpO2 97%   BMI 24.24 kg/m     Wt Readings from Last 3 Encounters:  01/20/19 120 lb (54.4 kg)  01/19/19 116 lb (52.6 kg)  12/22/18 116 lb (52.6 kg)     GEN: She is small in stature somewhat frail but quite alert she has no pallor of her skin or membranes no xanthoma or xanthelasma well nourished, well developed in no acute distress HEENT: Normal NECK: No JVD; No carotid bruits LYMPHATICS: No lymphadenopathy CARDIAC: Very benign barely able to be heard soft midsystolic ejection murmur S2 is normal no aortic regurgitation and no indication of aortic stenosis RRR, no murmurs, rubs, gallops RESPIRATORY:  Clear to auscultation without rales, wheezing or rhonchi  ABDOMEN: Soft, non-tender, non-distended MUSCULOSKELETAL: Trace ankle edema on calcium channel blocker edema; No deformity  SKIN: Warm and dry NEUROLOGIC:  Alert and oriented x 3 PSYCHIATRIC:  Normal affect     Signed, Norman HerrlichBrian , MD  01/20/2019 9:07 AM    [facility]

## 2019-01-19 NOTE — Patient Instructions (Signed)
Good to see you I have made a referral to physical therapy and neurology. They will call to make an appointment.  Good job on the exercises  Please continue heat and ice  Please send me a message in MyChart with any questions or updates.  Please see me back in 6 weeks.   --Dr. Raeford Razor
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
## 2019-01-19 NOTE — Assessment & Plan Note (Signed)
Seems to have strained her neck while trying to perform the left shoulder exercises.  Likely related to that her posture as to why the strain occurred.  Has had improvement with the muscle relaxer. -Referral to physical therapy. -Counseled on supportive care.

## 2019-01-19 NOTE — Assessment & Plan Note (Signed)
Reports this seems to be worsening.  Reports she was receiving injections in [place] for this. -Referral to neurology.

## 2019-01-20 ENCOUNTER — Ambulatory Visit (INDEPENDENT_AMBULATORY_CARE_PROVIDER_SITE_OTHER): Payer: Medicare Other | Admitting: Cardiology

## 2019-01-20 ENCOUNTER — Encounter: Payer: Self-pay | Admitting: Cardiology

## 2019-01-20 DIAGNOSIS — E119 Type 2 diabetes mellitus without complications: Secondary | ICD-10-CM | POA: Diagnosis not present

## 2019-01-20 DIAGNOSIS — I10 Essential (primary) hypertension: Secondary | ICD-10-CM

## 2019-01-20 DIAGNOSIS — E039 Hypothyroidism, unspecified: Secondary | ICD-10-CM | POA: Diagnosis not present

## 2019-01-20 MED ORDER — TELMISARTAN 20 MG PO TABS: 20.0000 mg | ORAL_TABLET | Freq: Every day | ORAL | 4 refills | Status: DC

## 2019-01-20 MED ORDER — HYDROCHLOROTHIAZIDE 12.5 MG PO CAPS: ORAL_CAPSULE | ORAL | 2 refills | Status: DC

## 2019-01-20 NOTE — Patient Instructions (Addendum)
Medication Instructions:  STOP: Losartan  DECREASE: Hydrochlorothyazide 12.5 mg (1 tab) to Mon and Thurs only  START: Telmisartan 20 mg (1 TAb) Daily with evening meal or bedtime  If you need a refill on your cardiac medications before your next appointment, please call your pharmacy.   Lab work: Your physician recommends that you return for lab work in: TODAY BMP,LIPID  If you have labs (blood work) drawn today and your tests are completely normal, you will receive your results only by: [name] MyChart Message (if you have MyChart) OR . A paper copy in the mail If you have any lab test that is abnormal or we need to change your treatment, we will call you to review the results.  Testing/Procedures: None   Follow-Up: At [facility], you and your health needs are our priority.  As part of our continuing mission to provide you with exceptional heart care, we have created designated Provider Care Teams.  These Care Teams include your primary Cardiologist (physician) and Advanced Practice Providers (APPs -  Physician Assistants and Nurse Practitioners) who all work together to provide you with the care you need, when you need it. You will need a follow up appointment in 4 weeks.  Any Other Special Instructions Will Be Listed Below   Tips to measure your blood pressure correctly  To determine whether you have hypertension, a medical professional will take a blood pressure reading. How you prepare for the test, the position of your arm, and other factors can change a blood pressure reading by 10% or more. That could be enough to hide high blood pressure, start you on a drug you don't really need, or lead your doctor to incorrectly adjust your medications. National and international guidelines offer specific instructions for measuring blood pressure. If a doctor, nurse, or medical assistant isn't doing it right, don't hesitate to ask him or her to get with the guidelines. Here's what you can do  to ensure a correct reading: . Don't drink a caffeinated beverage or smoke during the 30 minutes before the test. . Sit quietly for five minutes before the test begins. . During the measurement, sit in a chair with your feet on the floor and your arm supported so your elbow is at about heart level. . The inflatable part of the cuff should completely cover at least 80% of your upper arm, and the cuff should be placed on bare skin, not over a shirt. . Don't talk during the measurement. . Have your blood pressure measured twice, with a brief break in between. If the readings are different by 5 points or more, have it done a third time. There are times to break these rules. If you sometimes feel lightheaded when getting out of bed in the morning or when you stand after sitting, you should have your blood pressure checked while seated and then while standing to see if it falls from one position to the next. Because blood pressure varies throughout the day, your doctor will rarely diagnose hypertension on the basis of a single reading. Instead, he or she will want to confirm the measurements on at least two occasions, usually within a few weeks of one another. The exception to this rule is if you have a blood pressure reading of 180/110 mm Hg or higher. A result this high usually calls for prompt treatment. It's also a good idea to have your blood pressure measured in both arms at least once, since the reading in one arm (  usually the right) may be higher than that in the left. A 2014 study in The American Journal of Medicine of nearly 3,400 people found average arm- to-arm differences in systolic blood pressure of about 5 points. The higher number should be used to make treatment decisions. In 2017, new guidelines from the American Heart Association, the [employer], and nine other health organizations lowered the diagnosis of high blood pressure to 130/80 mm Hg or higher for all adults. The  guidelines also redefined the various blood pressure categories to now include normal, elevated, Stage 1 hypertension, Stage 2 hypertension, and hypertensive crisis (see "Blood pressure categories"). Blood pressure categories  Blood pressure category SYSTOLIC (upper number)  DIASTOLIC (lower number)  Normal Less than 120 mm Hg and Less than 80 mm Hg  Elevated 120-129 mm Hg and Less than 80 mm Hg  High blood pressure: Stage 1 hypertension 130-139 mm Hg or 80-89 mm Hg  High blood pressure: Stage 2 hypertension 140 mm Hg or higher or 90 mm Hg or higher  Hypertensive crisis (consult your doctor immediately) Higher than 180 mm Hg and/or Higher than 120 mm Hg  Source: American Heart Association and American Stroke Association. For more on getting your blood pressure under control, buy Controlling Your Blood Pressure, a Special Health Report from [facility].

## 2019-01-21 ENCOUNTER — Other Ambulatory Visit: Payer: Self-pay | Admitting: *Deleted

## 2019-01-21 LAB — BASIC METABOLIC PANEL
BUN/Creatinine Ratio: 21 (ref 12–28)
BUN: 21 mg/dL (ref 8–27)
CO2: 26 mmol/L (ref 20–29)
Calcium: 9.7 mg/dL (ref 8.7–10.3)
Chloride: 102 mmol/L (ref 96–106)
Creatinine, Ser: 0.98 mg/dL (ref 0.57–1.00)
GFR calc Af Amer: 60 mL/min/{1.73_m2} (ref >59)
GFR calc non Af Amer: 52 mL/min/{1.73_m2} — ABNORMAL LOW (ref >59)
Glucose: 92 mg/dL (ref 65–99)
Potassium: 4.2 mmol/L (ref 3.5–5.2)
Sodium: 140 mmol/L (ref 134–144)

## 2019-01-21 LAB — LIPID PANEL
Chol/HDL Ratio: 3.9 ratio (ref 0.0–4.4)
Cholesterol, Total: 284 mg/dL — ABNORMAL HIGH (ref 100–199)
HDL: 72 mg/dL (ref >39)
LDL Calculated: 193 mg/dL — ABNORMAL HIGH (ref 0–99)
Triglycerides: 94 mg/dL (ref 0–149)
VLDL Cholesterol Cal: 19 mg/dL (ref 5–40)

## 2019-01-21 MED ORDER — PRAVASTATIN SODIUM 40 MG PO TABS: 40.0000 mg | ORAL_TABLET | Freq: Every evening | ORAL | 3 refills | Status: DC

## 2019-01-21 NOTE — Progress Notes (Signed)
Pt called and gave blood work results, sent in Pravastatin 40 mg and let her know to check in 1 month.

## 2019-01-22 ENCOUNTER — Telehealth: Payer: Self-pay

## 2019-01-22 DIAGNOSIS — E785 Hyperlipidemia, unspecified: Secondary | ICD-10-CM

## 2019-01-22 DIAGNOSIS — I10 Essential (primary) hypertension: Secondary | ICD-10-CM

## 2019-01-22 DIAGNOSIS — E119 Type 2 diabetes mellitus without complications: Secondary | ICD-10-CM

## 2019-01-22 NOTE — Telephone Encounter (Signed)
Spoke with patient's son [place] regarding lab results.  Jose advised to have his mother start pravastatin 40mg  daily and recheck lipid and CMP in 1 month after starting medications.  Jacqulyn Bath states that he contacted office yesterday and spoke with someone about results and pravastatin was sent in yesterday.  Medication has already been picked up. Jose verbalized understanding.

## 2019-02-09 DIAGNOSIS — H26491 Other secondary cataract, right eye: Secondary | ICD-10-CM | POA: Diagnosis not present

## 2019-02-09 DIAGNOSIS — H04123 Dry eye syndrome of bilateral lacrimal glands: Secondary | ICD-10-CM | POA: Diagnosis not present

## 2019-02-10 ENCOUNTER — Telehealth: Payer: Self-pay | Admitting: Cardiology

## 2019-02-10 NOTE — Progress Notes (Signed)
Virtual Visit via Video Note   This visit type was conducted due to national recommendations for restrictions regarding the COVID-19 Pandemic (e.g. social distancing) in an effort to limit this patient's exposure and mitigate transmission in our community.  Due to her co-morbid illnesses, this patient is at least at moderate risk for complications without adequate follow up.  This format is felt to be most appropriate for this patient at this time.  All issues noted in this document were discussed and addressed.  A limited physical exam was performed with this format.  Please refer to the patient's chart for her consent to [facility].   Date:  02/11/2019   ID:  Leslie Ward Weinand, DOB [birth year], MRN [id number]  Patient Location: Home Provider Location: Office  PCP:  Esperanza RichtersSaguier, Edward, PA-C  Cardiologist:  Norman HerrlichBrian Quenna Doepke, MD  Electrophysiologist:  None   Evaluation Performed:  Follow-Up Visit  Chief Complaint:  85 year old female presents for follow up of HTN.   History of Present Illness:    Leslie Ward Salsgiver is a 85 y.o. female with hypertension last seen 01/20/2019. PMH HTN, DM2, hypothyroidism, HLD. At that visit she was switched to a more potent ARB in addition to her thiazide. She was also started on pravastatin for an LDL of 193.   We established a video link, but connection was difficult. Transitioned to phone visit. She looked well and happy for the short video portion of the visit. Her son assisted with the visit.   She checks her blood pressure daily. Reports systolics are typically 140-150. Diastolics typically in the 60s. She will occasionally get a systolic in the high 130s or 160s. No dizziness or pre-syncope.   She denies chest pain, palpitations. Endorses drinking about 5 bottles of water per day, but is concerned she isn't voiding as often. Discussed that this may be due to the heat. Encouraged to monitor for edema. Reports some edema in her hands and feet  last week that has resolved. Encouraged to monitor her salt intake. Has been walking outside for exercise - encouraged to continue.  Reports pain on back of neck which she is working for PT this evening. She is agreeable to stop by our office for labs prior.   The patient does not have symptoms concerning for COVID-19 infection (fever, chills, cough, or new shortness of breath).    Past Medical History:  Diagnosis Date  . Diabetes mellitus without complication (HCC)   . Hypertension   . Thyroid disease    Past Surgical History:  Procedure Laterality Date  . NO PAST SURGERIES       Current Meds  Medication Sig  . amLODipine (NORVASC) 5 MG tablet Take 5 mg by mouth 2 (two) times a day.   . Calcium Carbonate (CALCIUM 600 PO) Take 2 tablets by mouth daily.  . Calcium Polycarbophil (FIBER-CAPS PO) Take 1 capsule by mouth 2 (two) times a day.   . Cinnamon 500 MG capsule Take 1,000 mg by mouth daily.  . hydrochlorothiazide (MICROZIDE) 12.5 MG capsule Take 12.5 mg (1 TAb) on Mon and Thurs only  . levothyroxine (SYNTHROID) 75 MCG tablet Take 75 mcg by mouth daily before breakfast.   . MELATONIN PO Take 2-3 tablets by mouth at bedtime as needed.  . Menaquinone-7 (VITAMIN K2 PO) Take 1 tablet by mouth daily.  . metoprolol tartrate (LOPRESSOR) 50 MG tablet Take 50 mg by mouth 2 (two) times daily.  . Multiple Vitamin (MULTIVITAMIN) tablet Take 1 tablet  by mouth daily.  . NON FORMULARY Cholasta Care-3 tablet daily  . Omega-3 Fatty Acids (OMEGA 3 PO) Take 2 tablets by mouth daily.  . pravastatin (PRAVACHOL) 40 MG tablet Take 1 tablet (40 mg total) by mouth every evening.  . Probiotic Product (PROBIOTIC DAILY PO) Take 1 tablet by mouth daily.  [name] telmisartan (MICARDIS) 20 MG tablet Take 1 tablet (20 mg total) by mouth daily. With evening meal or at bedtime  . traMADol (ULTRAM) 50 MG tablet Take 1 tablet by mouth daily as needed.  . vitamin C (ASCORBIC ACID) 500 MG tablet Take 500 mg by mouth  daily.     Allergies:   Patient has no known allergies.   Social History   Tobacco Use  . Smoking status: Never Smoker  . Smokeless tobacco: Never Used  Substance Use Topics  . Alcohol use: No  . Drug use: No     Family Hx: The patient's family history includes Heart Problems in her brother; Heart attack in her father; Stroke in her mother.  ROS:   Please see the history of present illness.    Review of Systems  Constitution: Negative for chills, fever and malaise/fatigue.  Cardiovascular: Positive for leg swelling (last week, now better. with some hand swelling). Negative for chest pain, irregular heartbeat, near-syncope and palpitations.  Respiratory: Negative for cough, shortness of breath and wheezing.   Musculoskeletal: Positive for neck pain (getting PT this evening).    All other systems reviewed and are negative.   Prior CV studies:   The following studies were reviewed today:   Labs/Other Tests and Data Reviewed:    EKG:  No ECG reviewed.  Recent Labs: 12/22/2018: ALT 11 01/20/2019: BUN 21; Creatinine, Ser 0.98; Potassium 4.2; Sodium 140   Recent Lipid Panel Lab Results  Component Value Date/Time   CHOL 284 (H) 01/20/2019 09:28 AM   TRIG 94 01/20/2019 09:28 AM   HDL 72 01/20/2019 09:28 AM   CHOLHDL 3.9 01/20/2019 09:28 AM   LDLCALC 193 (H) 01/20/2019 09:28 AM    Wt Readings from Last 3 Encounters:  02/11/19 120 lb (54.4 kg)  01/20/19 120 lb (54.4 kg)  01/19/19 116 lb (52.6 kg)     Objective:    Vital Signs:  BP (!) 145/71 (BP Location: Left Arm, Patient Position: Sitting)   Pulse 79   Ht 4\' 11"  (1.499 m)   Wt 120 lb (54.4 kg)   BMI 24.24 kg/m    VITAL SIGNS:  reviewed GEN:  no acute distress NEURO:  alert and oriented x 3, no obvious focal deficit PSYCH:  normal affect  ASSESSMENT & PLAN:    1. HTN - Stable. BP well controlled at home with measures 140s-150s/60s. Continue current anti-hypertensive regimen including potent ARB, thiazide,  CCB, and beta blocker. Encouraged to continue to check BP daily.  2. HLD - 01/20/19 total cholesterol 284, HDL 72, LDL 193, triglycerides 94. She was initiated on Pravastatin. Will recheck liver function and lipid profile today. 3. Edema - Reports edema in her feet and hands last week which is now improved. Encouraged to monitor salt intake. Will obtain proBNP today.  4. Neck pain - Seeing PT today. Will get labs at our office prior as both located in [place]. Educated that pain may increase her BP.   COVID-19 Education: The signs and symptoms of COVID-19 were discussed with the patient and how to seek care for testing (follow up with PCP or arrange E-visit).  The importance  of social distancing was discussed today.  Time:   Today, I have spent 17 minutes with the patient with telehealth technology discussing the above problems.     Medication Adjustments/Labs and Tests Ordered: Current medicines are reviewed at length with the patient today.  Concerns regarding medicines are outlined above.   Tests Ordered: Orders Placed This Encounter  Procedures  . Basic Metabolic Panel (BMET)  . Pro b natriuretic peptide  . Lipid Profile    Medication Changes: No orders of the defined types were placed in this encounter.   Follow Up:  In Person in 3 month(s)  Signed, Norman HerrlichBrian Twanna Resh, MD  02/11/2019 9:14 AM    [facility]

## 2019-02-10 NOTE — Telephone Encounter (Signed)
Virtual Visit Pre-Appointment Phone Call  "(Name), I am calling you today to discuss your upcoming appointment. We are currently trying to limit exposure to the virus that causes COVID-19 by seeing patients at home rather than in the office."  1. "What is the BEST phone number to call the day of the visit?" - include this in appointment notes  2. Do you have or have access to (through a family member/friend) a smartphone with video capability that we can use for your visit?" a. If yes - list this number in appt notes as cell (if different from BEST phone #) and list the appointment type as a VIDEO visit in appointment notes b. If no - list the appointment type as a PHONE visit in appointment notes  3. Confirm consent - "In the setting of the current Covid19 crisis, you are scheduled for a (phone or video) visit with your provider on (date) at (time).  Just as we do with many in-office visits, in order for you to participate in this visit, we must obtain consent.  If you'd like, I can send this to your mychart (if signed up) or email for you to review.  Otherwise, I can obtain your verbal consent now.  All virtual visits are billed to your insurance company just like a normal visit would be.  By agreeing to a virtual visit, we'd like you to understand that the technology does not allow for your provider to perform an examination, and thus may limit your provider's ability to fully assess your condition. If your provider identifies any concerns that need to be evaluated in person, we will make arrangements to do so.  Finally, though the technology is pretty good, we cannot assure that it will always work on either your or our end, and in the setting of a video visit, we may have to convert it to a phone-only visit.  In either situation, we cannot ensure that we have a secure connection.  Are you willing to proceed?" STAFF: Did the patient verbally acknowledge consent to telehealth visit? Document  YES/NO here: spoke with pt  4. Advise patient to be prepared - "Two hours prior to your appointment, go ahead and check your blood pressure, pulse, oxygen saturation, and your weight (if you have the equipment to check those) and write them all down. When your visit starts, your provider will ask you for this information. If you have an Apple Watch or Kardia device, please plan to have heart rate information ready on the day of your appointment. Please have a pen and paper handy nearby the day of the visit as well."  5. Give patient instructions for MyChart download to smartphone OR Doximity/[email or web address] as below if video visit (depending on what platform provider is using)  6. Inform patient they will receive a phone call 15 minutes prior to their appointment time (may be from unknown caller ID) so they should be prepared to answer    TELEPHONE CALL NOTE  Leslie Ward has been deemed a candidate for a follow-up tele-health visit to limit community exposure during the Covid-19 pandemic. I spoke with the patient via phone to ensure availability of phone/video source, confirm preferred email & phone number, and discuss instructions and expectations.  I reminded Leslie Ward to be prepared with any vital sign and/or heart rhythm information that could potentially be obtained via home monitoring, at the time of her visit. I reminded Leslie Ward to expect a phone call prior to her  visit.  Leslie Ward 02/10/2019 1:36 PM   INSTRUCTIONS FOR DOWNLOADING THE MYCHART APP TO SMARTPHONE  - The patient must first make sure to have activated MyChart and know their login information - If Apple, go to [employer] and type in MyChart in the search bar and download the app. If Android, ask patient to go to [employer] and type in [place] in the search bar and download the app. The app is free but as with any other app downloads, their phone may require them to verify saved payment information or  Apple/Android password.  - The patient will need to then log into the app with their MyChart username and password, and select [facility] as their healthcare provider to link the account. When it is time for your visit, go to the MyChart app, find appointments, and click Begin Video Visit. Be sure to Select Allow for your device to access the Microphone and Camera for your visit. You will then be connected, and your provider will be with you shortly.  **If they have any issues connecting, or need assistance please contact MyChart service desk [phone])**  **If using a computer, in order to ensure the best quality for their visit they will need to use either of the following Internet Browsers: [employer], or Google Chrome**  IF USING DOXIMITY or DOXY.ME - The patient will receive a link just prior to their visit by text.     FULL LENGTH CONSENT FOR TELE-HEALTH VISIT   I hereby voluntarily request, consent and authorize [facility] and its employed or contracted physicians, physician assistants, nurse practitioners or other licensed health care professionals (the Practitioner), to provide me with telemedicine health care services (the Services") as deemed necessary by the treating Practitioner. I acknowledge and consent to receive the Services by the Practitioner via telemedicine. I understand that the telemedicine visit will involve communicating with the Practitioner through live audiovisual communication technology and the disclosure of certain medical information by electronic transmission. I acknowledge that I have been given the opportunity to request an in-person assessment or other available alternative prior to the telemedicine visit and am voluntarily participating in the telemedicine visit.  I understand that I have the right to withhold or withdraw my consent to the use of telemedicine in the course of my care at any time, without affecting my right to future care  or treatment, and that the Practitioner or I may terminate the telemedicine visit at any time. I understand that I have the right to inspect all information obtained and/or recorded in the course of the telemedicine visit and may receive copies of available information for a reasonable fee.  I understand that some of the potential risks of receiving the Services via telemedicine include:   Delay or interruption in medical evaluation due to technological equipment failure or disruption;  Information transmitted may not be sufficient (e.g. poor resolution of images) to allow for appropriate medical decision making by the Practitioner; and/or   In rare instances, security protocols could fail, causing a breach of personal health information.  Furthermore, I acknowledge that it is my responsibility to provide information about my medical history, conditions and care that is complete and accurate to the best of my ability. I acknowledge that Practitioner's advice, recommendations, and/or decision may be based on factors not within their control, such as incomplete or inaccurate data provided by me or distortions of diagnostic images or specimens that may result from electronic transmissions. I understand  that the practice of medicine is not an exact science and that Practitioner makes no warranties or guarantees regarding treatment outcomes. I acknowledge that I will receive a copy of this consent concurrently upon execution via email to the email address I last provided but may also request a printed copy by calling the office of [facility].    I understand that my insurance will be billed for this visit.   I have read or had this consent read to me.  I understand the contents of this consent, which adequately explains the benefits and risks of the Services being provided via telemedicine.   I have been provided ample opportunity to ask questions regarding this consent and the Services and have had  my questions answered to my satisfaction.  I give my informed consent for the services to be provided through the use of telemedicine in my medical care  By participating in this telemedicine visit I agree to the above.

## 2019-02-11 ENCOUNTER — Telehealth (INDEPENDENT_AMBULATORY_CARE_PROVIDER_SITE_OTHER): Payer: Medicare Other | Admitting: Cardiology

## 2019-02-11 ENCOUNTER — Ambulatory Visit: Payer: Medicare Other | Attending: Family Medicine | Admitting: Physical Therapy

## 2019-02-11 ENCOUNTER — Encounter: Payer: Self-pay | Admitting: Cardiology

## 2019-02-11 ENCOUNTER — Encounter: Payer: Self-pay | Admitting: Physical Therapy

## 2019-02-11 ENCOUNTER — Other Ambulatory Visit: Payer: Self-pay

## 2019-02-11 VITALS — BP 145/71 | HR 79 | Ht 59.0 in | Wt 120.0 lb

## 2019-02-11 VITALS — BP 132/65 | HR 70

## 2019-02-11 DIAGNOSIS — R293 Abnormal posture: Secondary | ICD-10-CM | POA: Diagnosis not present

## 2019-02-11 DIAGNOSIS — M6281 Muscle weakness (generalized): Secondary | ICD-10-CM | POA: Diagnosis not present

## 2019-02-11 DIAGNOSIS — I10 Essential (primary) hypertension: Secondary | ICD-10-CM

## 2019-02-11 DIAGNOSIS — R609 Edema, unspecified: Secondary | ICD-10-CM

## 2019-02-11 DIAGNOSIS — M542 Cervicalgia: Secondary | ICD-10-CM | POA: Diagnosis not present

## 2019-02-11 DIAGNOSIS — E785 Hyperlipidemia, unspecified: Secondary | ICD-10-CM

## 2019-02-11 DIAGNOSIS — G8929 Other chronic pain: Secondary | ICD-10-CM | POA: Diagnosis not present

## 2019-02-11 DIAGNOSIS — M25612 Stiffness of left shoulder, not elsewhere classified: Secondary | ICD-10-CM | POA: Insufficient documentation

## 2019-02-11 DIAGNOSIS — M25512 Pain in left shoulder: Secondary | ICD-10-CM | POA: Insufficient documentation

## 2019-02-11 HISTORY — DX: Edema, unspecified: R60.9

## 2019-02-11 HISTORY — DX: Hyperlipidemia, unspecified: E78.5
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
## 2019-02-11 NOTE — Patient Instructions (Signed)
Medication Instructions:  Your physician recommends that you continue on your current medications as directed. Please refer to the Current Medication list given to you today.  If you need a refill on your cardiac medications before your next appointment, please call your pharmacy.   Lab work: Your physician recommends that you return for lab work in TODAY BMP,Pro BNP  If you have labs (blood work) drawn today and your tests are completely normal, you will receive your results only by: . MyChart Message (if you have MyChart) OR . A paper copy in the mail If you have any lab test that is abnormal or we need to change your treatment, we will call you to review the results.  Testing/Procedures: None  Follow-Up: At [facility], you and your health needs are our priority.  As part of our continuing mission to provide you with exceptional heart care, we have created designated Provider Care Teams.  These Care Teams include your primary Cardiologist (physician) and Advanced Practice Providers (APPs -  Physician Assistants and Nurse Practitioners) who all work together to provide you with the care you need, when you need it. You will need a follow up appointment in 3 months. Any Other Special Instructions Will Be Listed Below (If Applicable).    

## 2019-02-11 NOTE — Addendum Note (Signed)
Addended by: Particia Nearing B on: 02/11/2019 09:44 AM   Modules accepted: Orders

## 2019-02-12 LAB — COMPREHENSIVE METABOLIC PANEL
ALT: 12 IU/L (ref 0–32)
AST: 14 IU/L (ref 0–40)
Albumin/Globulin Ratio: 1.5 (ref 1.2–2.2)
Albumin: 4.2 g/dL (ref 3.6–4.6)
Alkaline Phosphatase: 59 IU/L (ref 39–117)
BUN/Creatinine Ratio: 27 (ref 12–28)
BUN: 27 mg/dL (ref 8–27)
Bilirubin Total: 0.6 mg/dL (ref 0.0–1.2)
CO2: 24 mmol/L (ref 20–29)
Calcium: 9.6 mg/dL (ref 8.7–10.3)
Chloride: 101 mmol/L (ref 96–106)
Creatinine, Ser: 1.01 mg/dL — ABNORMAL HIGH (ref 0.57–1.00)
GFR calc Af Amer: 58 mL/min/{1.73_m2} — ABNORMAL LOW (ref >59)
GFR calc non Af Amer: 51 mL/min/{1.73_m2} — ABNORMAL LOW (ref >59)
Globulin, Total: 2.8 g/dL (ref 1.5–4.5)
Glucose: 88 mg/dL (ref 65–99)
Potassium: 4.2 mmol/L (ref 3.5–5.2)
Sodium: 137 mmol/L (ref 134–144)
Total Protein: 7 g/dL (ref 6.0–8.5)

## 2019-02-12 LAB — LIPID PANEL
Chol/HDL Ratio: 2.7 ratio (ref 0.0–4.4)
Cholesterol, Total: 183 mg/dL (ref 100–199)
HDL: 68 mg/dL (ref >39)
LDL Calculated: 89 mg/dL (ref 0–99)
Triglycerides: 129 mg/dL (ref 0–149)
VLDL Cholesterol Cal: 26 mg/dL (ref 5–40)

## 2019-02-12 LAB — PRO B NATRIURETIC PEPTIDE: NT-Pro BNP: 199 pg/mL (ref 0–738)

## 2019-02-15 ENCOUNTER — Other Ambulatory Visit: Payer: Self-pay

## 2019-02-15 ENCOUNTER — Encounter: Payer: Self-pay | Admitting: Physical Therapy

## 2019-02-15 ENCOUNTER — Ambulatory Visit: Payer: Medicare Other | Admitting: Physical Therapy

## 2019-02-15 VITALS — BP 132/60 | HR 69

## 2019-02-15 DIAGNOSIS — M542 Cervicalgia: Secondary | ICD-10-CM

## 2019-02-15 DIAGNOSIS — M25612 Stiffness of left shoulder, not elsewhere classified: Secondary | ICD-10-CM | POA: Diagnosis not present

## 2019-02-15 DIAGNOSIS — R293 Abnormal posture: Secondary | ICD-10-CM | POA: Diagnosis not present

## 2019-02-15 DIAGNOSIS — M6281 Muscle weakness (generalized): Secondary | ICD-10-CM

## 2019-02-15 DIAGNOSIS — G8929 Other chronic pain: Secondary | ICD-10-CM

## 2019-02-15 DIAGNOSIS — M25512 Pain in left shoulder: Secondary | ICD-10-CM | POA: Diagnosis not present

## 2019-02-15 NOTE — Therapy (Signed)
[facility] [place] [address]  [address] [place], [place], [address] Phone: [phone]   Fax:  [phone]  Physical Therapy Treatment  Patient Details  Name: Leslie ModeFanny Booher MRN: [id number] Date of Birth: 1931/01/23 Referring Provider (PT): Clare GandyJeremy Schmitz, MD   Encounter Date: 02/15/2019  PT End of Session - 02/15/19 1753    Visit Number  2    Number of Visits  7    Date for PT Re-Evaluation  03/25/19    Authorization Type  UHC Medicare    PT Start Time  1701    PT Stop Time  1758   moist heat   PT Time Calculation (min)  57 min    Activity Tolerance  Patient tolerated treatment well    Behavior During Therapy  [facility] for tasks assessed/performed       Past Medical History:  Diagnosis Date  . Diabetes mellitus without complication (HCC)   . Hypertension   . Thyroid disease     Past Surgical History:  Procedure Laterality Date  . NO PAST SURGERIES      Vitals:   02/15/19 1702  BP: 132/60  Pulse: 69  SpO2: 94%    Subjective Assessment - 02/15/19 1702    Subjective  Reports that she is doing well, but like always with pain in L arm.    Pertinent History  thyroid disease, HTN, DM    Diagnostic tests  Per patient- had xrays done which were clear    Patient Stated Goals  get rid of pain    Currently in Pain?  Yes    Pain Score  7     Pain Location  Shoulder    Pain Orientation  Left    Pain Type  Chronic pain                       [facility] Adult PT Treatment/Exercise - 02/15/19 0001      Exercises   Exercises  Neck;Shoulder      Neck Exercises: Seated   Neck Retraction  10 reps    Neck Retraction Limitations  10x3"    Other Seated Exercise  B scap retraction 10x3"      Shoulder Exercises: Seated   Flexion  Left;AAROM;15 reps    Flexion Limitations  orange pball rollouts     Abduction  AAROM;Left;15 reps    ABduction Limitations  orange pball rollouts     Other Seated Exercises  row with  yellow TB x10    Other Seated Exercises  L IR/ER stretch with strap 10x3" to tolerance      Shoulder Exercises: Pulleys   Flexion  3 minutes    Flexion Limitations  to tolerance    Scaption  3 minutes    Scaption Limitations  to tolerance      Modalities   Modalities  Moist Heat      Moist Heat Therapy   Number Minutes Moist Heat  10 Minutes    Moist Heat Location  Cervical      Neck Exercises: Stretches   Upper Trapezius Stretch  Right;Left;1 rep;30 seconds    Upper Trapezius Stretch Limitations  cues to avoid shoulder hiking    Levator Stretch  Right;Left;1 rep;30 seconds    Levator Stretch Limitations  cues to avoid shoulder hiking             PT Education - 02/15/19 1752    Education Details  update HEP  Person(s) Educated  Patient    Methods  Explanation;Demonstration;Tactile cues;Verbal cues;Handout    Comprehension  Verbalized understanding;Returned demonstration       PT Short Term Goals - 02/15/19 1808      PT SHORT TERM GOAL #1   Title  Patient to be independent with initial HEP.    Time  3    Period  Weeks    Status  New    Target Date  03/04/19        PT Long Term Goals - 02/15/19 1812      PT LONG TERM GOAL #1   Title  Patient to be independent with advanced HEP.    Time  6    Period  Weeks    Status  On-going      PT LONG TERM GOAL #2   Title  Patient to demonstrate L shoulder and cervical AROM WFL and without pain limiting.    Time  6    Period  Weeks    Status  On-going      PT LONG TERM GOAL #3   Title  Patient to demonstrate L shoulder strength >=4+/5.    Time  6    Period  Weeks    Status  On-going      PT LONG TERM GOAL #4   Title  Patient to report and demonstrate improved postural awareness.    Time  6    Period  Weeks    Status  On-going      PT LONG TERM GOAL #5   Title  Patient to report 80% improvement in pain levels when reaching to overhead shelf with L arm.    Time  6    Period  Weeks    Status  On-going             Plan - 02/15/19 1753    Clinical Impression Statement  Patient arrived to session with no new complaints. Reviewed HEP for improved carryover- patient requiring cues to avoid shoulder hiking with cervical stretches. Good tolerance of shoulder AAROM, with most difficulty with IR and ER. Updated HEP with shoulder IR/ER stretch with strap and advised to avoid pushing into pain. Patient reported understanding. Introduced scapular row with light resistance; patient requiring manual cues for shoulder depression and retraction. Ended session with moist heat to L shoulder and cervical spine- patient reporting good benefit and normal integumentary response observed. No complaints at end of session.    Comorbidities  thyroid disease, HTN, DM    PT Treatment/Interventions  ADLs/Self Care Home Management;Cryotherapy;Moist Heat;Electrical Stimulation;Therapeutic exercise;Therapeutic activities;Functional mobility training;Ultrasound;Neuromuscular re-education;Patient/family education;Manual techniques;Vasopneumatic Device;Taping;Energy conservation;Dry needling;Passive range of motion    PT Next Visit Plan  progress L shoulder ROM; STM    Consulted and Agree with Plan of Care  Patient       Patient will benefit from skilled therapeutic intervention in order to improve the following deficits and impairments:  Hypomobility, Decreased activity tolerance, Decreased strength, Impaired UE functional use, Pain, Increased fascial restricitons, Improper body mechanics, Decreased range of motion, Impaired flexibility, Postural dysfunction  Visit Diagnosis: 1. Chronic left shoulder pain   2. Stiffness of left shoulder, not elsewhere classified   3. Cervicalgia   4. Muscle weakness (generalized)   5. Abnormal posture        Problem List Patient Active Problem List   Diagnosis Date Noted  . Hyperlipidemia 02/11/2019  . Edema 02/11/2019  . Hypertension 01/19/2019  . Type 2 diabetes mellitus (HCC)  01/19/2019  .  Hypothyroidism (acquired) 01/19/2019  . Neck pain 01/19/2019  . Dystonia 01/19/2019  . Adhesive capsulitis of left shoulder 12/22/2018     Janene Harvey, PT, DPT 02/15/19 6:13 PM   [facility] [place] [address]  [place], [place], [address] Phone: [phone]   Fax:  [phone]  Name: Verdelle Valtierra MRN: [id number] Date of Birth: [birth year]

## 2019-02-21 ENCOUNTER — Encounter: Payer: Self-pay | Admitting: Medical

## 2019-02-22 DIAGNOSIS — Z961 Presence of intraocular lens: Secondary | ICD-10-CM | POA: Diagnosis not present

## 2019-02-22 DIAGNOSIS — H26492 Other secondary cataract, left eye: Secondary | ICD-10-CM | POA: Diagnosis not present

## 2019-02-22 DIAGNOSIS — I10 Essential (primary) hypertension: Secondary | ICD-10-CM | POA: Diagnosis not present

## 2019-02-22 DIAGNOSIS — H26491 Other secondary cataract, right eye: Secondary | ICD-10-CM | POA: Diagnosis not present

## 2019-02-22 MED ORDER — METOPROLOL TARTRATE 50 MG PO TABS: 50.0000 mg | ORAL_TABLET | Freq: Two times a day (BID) | ORAL | 1 refills | Status: DC

## 2019-02-25 ENCOUNTER — Encounter: Payer: Self-pay | Admitting: Medical

## 2019-02-27 ENCOUNTER — Telehealth: Payer: Self-pay | Admitting: Medical

## 2019-02-27 MED ORDER — AMLODIPINE BESYLATE 5 MG PO TABS: 5.0000 mg | ORAL_TABLET | Freq: Two times a day (BID) | ORAL | 2 refills | Status: DC

## 2019-02-27 NOTE — Telephone Encounter (Signed)
  rx refill of amlodipine at pt request.

## 2019-03-02 ENCOUNTER — Ambulatory Visit: Payer: Medicare Other | Admitting: Family Medicine

## 2019-03-04 ENCOUNTER — Other Ambulatory Visit: Payer: Self-pay

## 2019-03-04 ENCOUNTER — Ambulatory Visit: Payer: Medicare Other | Admitting: Physical Therapy

## 2019-03-04 ENCOUNTER — Encounter: Payer: Self-pay | Admitting: Physical Therapy

## 2019-03-04 VITALS — BP 120/82 | HR 65

## 2019-03-04 DIAGNOSIS — M542 Cervicalgia: Secondary | ICD-10-CM | POA: Diagnosis not present

## 2019-03-04 DIAGNOSIS — M25612 Stiffness of left shoulder, not elsewhere classified: Secondary | ICD-10-CM | POA: Diagnosis not present

## 2019-03-04 DIAGNOSIS — M25512 Pain in left shoulder: Secondary | ICD-10-CM | POA: Diagnosis not present

## 2019-03-04 DIAGNOSIS — G8929 Other chronic pain: Secondary | ICD-10-CM | POA: Diagnosis not present

## 2019-03-04 DIAGNOSIS — M6281 Muscle weakness (generalized): Secondary | ICD-10-CM

## 2019-03-04 DIAGNOSIS — R293 Abnormal posture: Secondary | ICD-10-CM | POA: Diagnosis not present

## 2019-03-04 NOTE — Therapy (Signed)
[facility] [place] [address]  [address] [place], [place], [address] Phone: [phone]   Fax:  [phone]  Physical Therapy Treatment  Patient Details  Name: Leslie Ward MRN: [id number] Date of Birth: 09-Mar-1931 Referring Provider (PT): Clare GandyJeremy Schmitz, MD   Encounter Date: 03/04/2019  PT End of Session - 03/04/19 1658    Visit Number  3    Number of Visits  7    Date for PT Re-Evaluation  03/25/19    Authorization Type  UHC Medicare    PT Start Time  1542   patient late   PT Stop Time  1616    PT Time Calculation (min)  34 min    Activity Tolerance  Patient tolerated treatment well    Behavior During Therapy  [facility] for tasks assessed/performed       Past Medical History:  Diagnosis Date  . Diabetes mellitus without complication (HCC)   . Hypertension   . Thyroid disease     Past Surgical History:  Procedure Laterality Date  . NO PAST SURGERIES      Vitals:   03/04/19 1612  BP: 120/82  Pulse: 65  SpO2: 92%    Subjective Assessment - 03/04/19 1543    Subjective  Apologizes for being late today d/t the weather. Was not here last week d/t having cataract surgery. Neck has been feeling the same. Towel stretch was bothering her so she stopped for a couple days.    Pertinent History  thyroid disease, HTN, DM    Diagnostic tests  Per patient- had xrays done which were clear    Patient Stated Goals  get rid of pain    Currently in Pain?  Yes    Pain Score  6     Pain Location  Shoulder    Pain Orientation  Left    Pain Type  Chronic pain                       [facility] Adult PT Treatment/Exercise - 03/04/19 0001      Shoulder Exercises: Seated   External Rotation  AAROM;Left;15 reps    External Rotation Limitations  sitting with wand; manual cues to maintain elbows at side    Internal Rotation  AAROM;Left;15 reps    Internal Rotation Limitations  sitting with wand; manual cues to maintain elbows  at side    Flexion  Left;AAROM;10 reps    Flexion Limitations  with wand to tolerance    Abduction  AAROM;Left;10 reps    ABduction Limitations  with wand to tolerance      Shoulder Exercises: Pulleys   Flexion  3 minutes    Flexion Limitations  to tolerance    Scaption  3 minutes    Scaption Limitations  to tolerance      Manual Therapy   Manual Therapy  Soft tissue mobilization;Myofascial release    Manual therapy comments  sitting    Soft tissue mobilization  STM to L UT, LS, cervical paraspinals, suboccipitals    Myofascial Release  manual TPR to L UT, LS             PT Education - 03/04/19 1657    Education Details  update to HEP    Person(s) Educated  Patient    Methods  Explanation;Demonstration;Tactile cues;Verbal cues;Handout    Comprehension  Verbalized understanding;Returned demonstration       PT Short Term Goals - 03/04/19 1659  PT SHORT TERM GOAL #1   Title  Patient to be independent with initial HEP.    Time  3    Period  Weeks    Status  On-going    Target Date  03/04/19        PT Long Term Goals - 02/15/19 1812      PT LONG TERM GOAL #1   Title  Patient to be independent with advanced HEP.    Time  6    Period  Weeks    Status  On-going      PT LONG TERM GOAL #2   Title  Patient to demonstrate L shoulder and cervical AROM WFL and without pain limiting.    Time  6    Period  Weeks    Status  On-going      PT LONG TERM GOAL #3   Title  Patient to demonstrate L shoulder strength >=4+/5.    Time  6    Period  Weeks    Status  On-going      PT LONG TERM GOAL #4   Title  Patient to report and demonstrate improved postural awareness.    Time  6    Period  Weeks    Status  On-going      PT LONG TERM GOAL #5   Title  Patient to report 80% improvement in pain levels when reaching to overhead shelf with L arm.    Time  6    Period  Weeks    Status  On-going            Plan - 03/04/19 1658    Clinical Impression Statement   Patient arrived late to session d/t weather. Reports that neck pain is about the same, but L shoulder is mildly improved. She feels she is better able to don her undergarments. Initiated sitting wand AAROM with manual cues to guide patient into correct movement pattern. Patient reporting benefit from this exercise. Tolerated STM and manual TPR to L UT, LS, cervical paraspinals, and suboccipitals. Patient reported benefit from manual therapy. Updated HEP with exercises that were well-tolerated today. Patient reported understanding and with no complaints at end of session.    Comorbidities  thyroid disease, HTN, DM    PT Treatment/Interventions  ADLs/Self Care Home Management;Cryotherapy;Moist Heat;Electrical Stimulation;Therapeutic exercise;Therapeutic activities;Functional mobility training;Ultrasound;Neuromuscular re-education;Patient/family education;Manual techniques;Vasopneumatic Device;Taping;Energy conservation;Dry needling;Passive range of motion    PT Next Visit Plan  progress L shoulder ROM; STM    Consulted and Agree with Plan of Care  Patient       Patient will benefit from skilled therapeutic intervention in order to improve the following deficits and impairments:  Hypomobility, Decreased activity tolerance, Decreased strength, Impaired UE functional use, Pain, Increased fascial restricitons, Improper body mechanics, Decreased range of motion, Impaired flexibility, Postural dysfunction  Visit Diagnosis: 1. Chronic left shoulder pain   2. Stiffness of left shoulder, not elsewhere classified   3. Cervicalgia   4. Muscle weakness (generalized)   5. Abnormal posture        Problem List Patient Active Problem List   Diagnosis Date Noted  . Hyperlipidemia 02/11/2019  . Edema 02/11/2019  . Hypertension 01/19/2019  . Type 2 diabetes mellitus (HCC) 01/19/2019  . Hypothyroidism (acquired) 01/19/2019  . Neck pain 01/19/2019  . Dystonia 01/19/2019  . Adhesive capsulitis of left  shoulder 12/22/2018     Leslie Ward, PT, DPT 03/04/19 5:43 PM    [facility]  [address]  [place], [place], [address] Phone: [phone]   Fax:  [phone]  Name: Leslie Ward MRN: [id number] Date of Birth: [birth year]

## 2019-03-08 ENCOUNTER — Encounter: Payer: Self-pay | Admitting: Neurology

## 2019-03-08 ENCOUNTER — Ambulatory Visit (INDEPENDENT_AMBULATORY_CARE_PROVIDER_SITE_OTHER): Payer: Medicare Other | Admitting: Neurology

## 2019-03-08 ENCOUNTER — Telehealth: Payer: Self-pay | Admitting: Neurology

## 2019-03-08 ENCOUNTER — Other Ambulatory Visit: Payer: Self-pay

## 2019-03-08 VITALS — BP 144/71 | HR 81 | Temp 97.8°F | Ht 59.0 in | Wt 122.5 lb

## 2019-03-08 DIAGNOSIS — M542 Cervicalgia: Secondary | ICD-10-CM | POA: Diagnosis not present

## 2019-03-08 DIAGNOSIS — G243 Spasmodic torticollis: Secondary | ICD-10-CM

## 2019-03-08 DIAGNOSIS — H26492 Other secondary cataract, left eye: Secondary | ICD-10-CM | POA: Diagnosis not present

## 2019-03-08 HISTORY — DX: Spasmodic torticollis: G24.3

## 2019-03-08 NOTE — Telephone Encounter (Signed)
[employer] medicare/medicaid order sent to [facility]. No auth they will reach out to the patient to schedule.  °
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
## 2019-03-11 ENCOUNTER — Encounter: Payer: Self-pay | Admitting: Physical Therapy

## 2019-03-11 ENCOUNTER — Other Ambulatory Visit: Payer: Self-pay

## 2019-03-11 ENCOUNTER — Ambulatory Visit: Payer: Medicare Other | Admitting: Physical Therapy

## 2019-03-11 VITALS — BP 124/62 | HR 70

## 2019-03-11 DIAGNOSIS — M542 Cervicalgia: Secondary | ICD-10-CM

## 2019-03-11 DIAGNOSIS — M25612 Stiffness of left shoulder, not elsewhere classified: Secondary | ICD-10-CM

## 2019-03-11 DIAGNOSIS — R293 Abnormal posture: Secondary | ICD-10-CM

## 2019-03-11 DIAGNOSIS — M25512 Pain in left shoulder: Secondary | ICD-10-CM | POA: Diagnosis not present

## 2019-03-11 DIAGNOSIS — G8929 Other chronic pain: Secondary | ICD-10-CM | POA: Diagnosis not present

## 2019-03-11 DIAGNOSIS — M6281 Muscle weakness (generalized): Secondary | ICD-10-CM | POA: Diagnosis not present

## 2019-03-11 NOTE — Therapy (Addendum)
[facility] [address]  [place], [place], [address] Phone: [phone]   Fax:  [phone]  Physical Therapy Treatment  Patient Details  Name: Jearlean Demauro MRN: [id number] Date of Birth: [birth year] Referring Provider (PT): Clearance Coots, MD  Progress Note Reporting Period 02/11/19 to 03/11/19  See note below for Objective Data and Assessment of Progress/Goals.     Encounter Date: 03/11/2019  PT End of Session - 03/11/19 1628    Visit Number  4    Number of Visits  7    Date for PT Re-Evaluation  03/25/19    Authorization Type  [employer] Medicare    PT Start Time  2500    PT Stop Time  1632   moist heat   PT Time Calculation (min)  61 min    Activity Tolerance  Patient tolerated treatment well    Behavior During Therapy  WFL for tasks assessed/performed       Past Medical History:  Diagnosis Date  . Dystonia   . Hypertension   . Thyroid disease     Past Surgical History:  Procedure Laterality Date  . NO PAST SURGERIES      Vitals:   03/11/19 1614  BP: 124/62  Pulse: 70  SpO2: 95%    Subjective Assessment - 03/11/19 1532    Subjective  Reports that she has not been performing HEP d/t cataract removal surgery.    Pertinent History  thyroid disease, HTN, DM    Diagnostic tests  Per patient- had xrays done which were clear    Patient Stated Goals  get rid of pain    Pain Score  7     Pain Location  Shoulder    Pain Orientation  Left    Pain Type  Chronic pain                       [facility] Adult PT Treatment/Exercise - 03/11/19 0001      Shoulder Exercises: Seated   Flexion  Left;AAROM;10 reps    Flexion Limitations  with wand to tolerance    Other Seated Exercises  L shoulder scaption to 90 deg with 1# 10x   manual cues to maintain scaption and keep shoulder down     Shoulder Exercises: Sidelying   External Rotation  Strengthening;Left;10 reps;Weights    External  Rotation Weight (lbs)  0, 1, 2    External Rotation Limitations  cues to lift until forearm parallel to ground   10x each with 0, 1, 2 lbs     Shoulder Exercises: Pulleys   Flexion  3 minutes    Flexion Limitations  to tolerance    Scaption  3 minutes    Scaption Limitations  to tolerance      Shoulder Exercises: Stretch   Corner Stretch  3 reps;30 seconds    Corner Stretch Limitations  to tolerance; cues to maintain for 30 sec    Other Shoulder Stretches  L IR stretch with strap 10x5"      Moist Heat Therapy   Number Minutes Moist Heat  10 Minutes    Moist Heat Location  Cervical      Manual Therapy   Manual Therapy  Soft tissue mobilization;Myofascial release    Manual therapy comments  sitting    Soft tissue mobilization  STM to B UT, LS, cervical paraspinals, suboccipitals; more tension R>L, L proximal medial biceps and pec    Myofascial  Release  manual TPR to B UT, LS, proximal biceps             PT Education - 03/11/19 1628    Education Details  update to HEP    Person(s) Educated  Patient    Methods  Explanation;Demonstration;Tactile cues;Verbal cues;Handout    Comprehension  Verbalized understanding;Returned demonstration       PT Short Term Goals - 03/11/19 1629      PT SHORT TERM GOAL #1   Title  Patient to be independent with initial HEP.    Time  3    Period  Weeks    Status  Achieved    Target Date  03/04/19        PT Long Term Goals - 02/15/19 1812      PT LONG TERM GOAL #1   Title  Patient to be independent with advanced HEP.    Time  6    Period  Weeks    Status  On-going      PT LONG TERM GOAL #2   Title  Patient to demonstrate L shoulder and cervical AROM WFL and without pain limiting.    Time  6    Period  Weeks    Status  On-going      PT LONG TERM GOAL #3   Title  Patient to demonstrate L shoulder strength >=4+/5.    Time  6    Period  Weeks    Status  On-going      PT LONG TERM GOAL #4   Title  Patient to report and  demonstrate improved postural awareness.    Time  6    Period  Weeks    Status  On-going      PT LONG TERM GOAL #5   Title  Patient to report 80% improvement in pain levels when reaching to overhead shelf with L arm.    Time  6    Period  Weeks    Status  On-going            Plan - 03/11/19 1629    Clinical Impression Statement  Patient reporting that she has been "taking it easy" per MD's orders this week after having cataract removal surgery. Began session with STM and manual TPR to posterior neck musculature d/t increased pain levels today. Patient demonstrating increased muscle tension to B UT, LS, cervical paraspinals, suboccipitals; more tension R>L. Also with palpable and painful trigger point in L proximal medial biceps and pec. Patient showing improvement in IR with stretching. Introduced Geologist, engineering which patient tolerated well. Only requiring minor cues to maintain the stretch for a full 30 sec. Also demonstrated good form with introduction of sidelying ER- able to tolerate up to 2 lbs. Updated HEP with exercises that were well-tolerated today. Patient reported understanding. Ended session with moist heat to neck. Patient without complaints at end of session.    Comorbidities  thyroid disease, HTN, DM    PT Treatment/Interventions  ADLs/Self Care Home Management;Cryotherapy;Moist Heat;Electrical Stimulation;Therapeutic exercise;Therapeutic activities;Functional mobility training;Ultrasound;Neuromuscular re-education;Patient/family education;Manual techniques;Vasopneumatic Device;Taping;Energy conservation;Dry needling;Passive range of motion    PT Next Visit Plan  progress L shoulder ROM; STM    Consulted and Agree with Plan of Care  Patient       Patient will benefit from skilled therapeutic intervention in order to improve the following deficits and impairments:  Hypomobility, Decreased activity tolerance, Decreased strength, Impaired UE functional use, Pain, Increased fascial  restricitons, Improper body mechanics, Decreased range  of motion, Impaired flexibility, Postural dysfunction  Visit Diagnosis: 1. Chronic left shoulder pain   2. Stiffness of left shoulder, not elsewhere classified   3. Cervicalgia   4. Muscle weakness (generalized)   5. Abnormal posture        Problem List Patient Active Problem List   Diagnosis Date Noted  . Cervical dystonia 03/08/2019  . Hyperlipidemia 02/11/2019  . Edema 02/11/2019  . Hypertension 01/19/2019  . Type 2 diabetes mellitus ([place]) 01/19/2019  . Hypothyroidism (acquired) 01/19/2019  . Neck pain 01/19/2019  . Dystonia 01/19/2019  . Adhesive capsulitis of left shoulder 12/22/2018     Janene Harvey, PT, DPT 03/11/19 4:53 PM   [facility] [address]  [place], [place], [address] Phone: [phone]   Fax:  [phone]  Name: Georgeanna Radziewicz MRN: [id number] Date of Birth: 1930-09-23  PHYSICAL THERAPY DISCHARGE SUMMARY  Visits from Start of Care: 4  Current functional level related to goals / functional outcomes: Unable to assess; patient did not return d/t concerns about COVID-19   Remaining deficits: Unable to assess   Education / Equipment: HEP  Plan: Patient agrees to discharge.  Patient goals were not met. Patient is being discharged due to not returning since the last visit.  ?????     Janene Harvey, PT, DPT 04/21/19 11:18 AM

## 2019-03-18 ENCOUNTER — Encounter: Payer: Medicare Other | Admitting: Physical Therapy

## 2019-03-25 ENCOUNTER — Ambulatory Visit: Payer: Medicare Other | Attending: Family Medicine | Admitting: Physical Therapy

## 2019-03-29 ENCOUNTER — Other Ambulatory Visit: Payer: Self-pay | Admitting: *Deleted

## 2019-03-29 ENCOUNTER — Telehealth: Payer: Self-pay | Admitting: Cardiology

## 2019-03-29 MED ORDER — TELMISARTAN 20 MG PO TABS: 20.0000 mg | ORAL_TABLET | Freq: Every day | ORAL | 1 refills | Status: DC

## 2019-03-29 NOTE — Telephone Encounter (Signed)
Refill sent.

## 2019-03-29 NOTE — Telephone Encounter (Signed)
°*  STAT* If patient is at the pharmacy, call can be transferred to refill team.   1. Which medications need to be refilled? (please list name of each medication and dose if known) telmisartan (MICARDIS) 20 MG tablet  2. Which pharmacy/location (including street and city if local pharmacy) is medication to be sent to?  [id number] - [place], [place] - [address] AT [place] [address] [facility] [address] [phone] (Phone) [phone] (Fax)    3. Do they need a 30 day or 90 day supply? 90 day

## 2019-03-31 ENCOUNTER — Other Ambulatory Visit: Payer: Self-pay | Admitting: Cardiology

## 2019-04-27 ENCOUNTER — Encounter: Payer: Self-pay | Admitting: Medical

## 2019-04-28 ENCOUNTER — Telehealth: Payer: Self-pay | Admitting: Medical

## 2019-04-28 DIAGNOSIS — E039 Hypothyroidism, unspecified: Secondary | ICD-10-CM

## 2019-04-28 MED ORDER — LEVOTHYROXINE SODIUM 75 MCG PO TABS: 75.0000 ug | ORAL_TABLET | Freq: Every day | ORAL | 0 refills | Status: DC

## 2019-04-28 NOTE — Telephone Encounter (Signed)
Pt requesting refill of thyroid med. I don't thinks she has had thyroid labs done in past. Recommend she get tsh and t4 done. Future labs placed. Will give one month refill. Please notify pt and get scheduled for labs

## 2019-05-10 ENCOUNTER — Other Ambulatory Visit: Payer: Medicare Other

## 2019-05-10 NOTE — Progress Notes (Signed)
Cardiology Office Note:    Date:  05/11/2019   ID:  Leslie Ward, DOB 1931-07-28, MRN [id number]  PCP:  [name], PA-C  Cardiologist:  Norman Herrlich, MD    Referring MD: [name], PA-C    ASSESSMENT:    1. Essential hypertension   2. Hyperlipidemia, unspecified hyperlipidemia type    PLAN:    In order of problems listed above:  1. Home blood pressure seems to be at target in the afternoon at rest runs less than 140 systolic 60/70.  She has complaints of edema but no significant on exam at this time I would not change her combination therapy including diuretic ARB beta-blocker or calcium channel blocker.  Recheck labs for renal function potassium today 2. Unfortunately statin intolerant readdress next issue of her on statin therapy Zetia   Next appointment: 3 months   Medication Adjustments/Labs and Tests Ordered: Current medicines are reviewed at length with the patient today.  Concerns regarding medicines are outlined above.  No orders of the defined types were placed in this encounter.  No orders of the defined types were placed in this encounter.   Chief Complaint  Patient presents with  . Follow-up  . Hypertension  . Hyperlipidemia    History of Present Illness:    Leslie Ward is a 85 y.o. female with a hx of hypertension hyperlipidemia and type 2 diabetes last seen 02/11/2019 virtual visit.  With complaints of edema follow-up proBNP level N-terminal was low and not consistent with heart failure at 199.[name] Compliance with diet, lifestyle and medications: Yes  Is a difficult visit as she thought she is here for lab work as the anniversary of the death in the family and she is very agitated.  She gets up in the morning checks her blood pressure immediately and it is above 140 later in the day it runs 1 30-1 40 systolic.  Because of back and shoulder pain the family stopped her statin.  No chest pain palpitation or syncope complains of edema and her  rings being tight. Past Medical History:  Diagnosis Date  . Dystonia   . Hypertension   . Thyroid disease     Past Surgical History:  Procedure Laterality Date  . NO PAST SURGERIES      Current Medications: No outpatient medications have been marked as taking for the 05/11/19 encounter (Office Visit) with Baldo Daub, MD.     Allergies:   Patient has no known allergies.   Social History   Socioeconomic History  . Marital status: Single    Spouse name: Not on file  . Number of children: 1  . Years of education: 6 or 7 years of education  . Highest education level: Not on file  Occupational History  . Occupation: Retired  Engineer, production  . Financial resource strain: Not on file  . Food insecurity    Worry: Not on file    Inability: Not on file  . Transportation needs    Medical: Not on file    Non-medical: Not on file  Tobacco Use  . Smoking status: Never Smoker  . Smokeless tobacco: Never Used  Substance and Sexual Activity  . Alcohol use: No  . Drug use: No  . Sexual activity: Not Currently  Lifestyle  . Physical activity    Days per week: Not on file    Minutes per session: Not on file  . Stress: Not on file  Relationships  . Social connections    Talks  on phone: Not on file    Gets together: Not on file    Attends religious service: Not on file    Active member of club or organization: Not on file    Attends meetings of clubs or organizations: Not on file    Relationship status: Not on file  Other Topics Concern  . Not on file  Social History Narrative   Lives alone.   Right-handed.   No caffeine use.     Family History: The patient's family history includes Heart Problems in her brother; Heart attack in her father; Stroke in her mother. ROS:   Please see the history of present illness.    All other systems reviewed and are negative.  EKGs/Labs/Other Studies Reviewed:    The following studies were reviewed today:  Recent Labs: 02/11/2019:  ALT 12; BUN 27; Creatinine, Ser 1.01; NT-Pro BNP 199; Potassium 4.2; Sodium 137  Recent Lipid Panel    Component Value Date/Time   CHOL 183 02/11/2019 1647   TRIG 129 02/11/2019 1647   HDL 68 02/11/2019 1647   CHOLHDL 2.7 02/11/2019 1647   LDLCALC 89 02/11/2019 1647    Physical Exam:    VS:  Wt 123 lb 1.9 oz (55.8 kg)   BMI 24.87 kg/m     Wt Readings from Last 3 Encounters:  05/11/19 123 lb 1.9 oz (55.8 kg)  03/08/19 122 lb 8 oz (55.6 kg)  02/11/19 120 lb (54.4 kg)     GEN:  Well nourished, well developed in no acute distress HEENT: Normal NECK: No JVD; No carotid bruits LYMPHATICS: No lymphadenopathy CARDIAC: RRR, no murmurs, rubs, gallops RESPIRATORY:  Clear to auscultation without rales, wheezing or rhonchi  ABDOMEN: Soft, non-tender, non-distended MUSCULOSKELETAL:  No edema; No deformity  SKIN: Warm and dry NEUROLOGIC:  Alert and oriented x 3 PSYCHIATRIC:  Normal affect    Signed, Shirlee More, MD  05/11/2019 8:36 AM    [place]

## 2019-05-11 ENCOUNTER — Encounter: Payer: Self-pay | Admitting: Cardiology

## 2019-05-11 ENCOUNTER — Other Ambulatory Visit: Payer: Medicare Other

## 2019-05-11 ENCOUNTER — Other Ambulatory Visit: Payer: Self-pay

## 2019-05-11 ENCOUNTER — Telehealth: Payer: Self-pay | Admitting: Neurology

## 2019-05-11 ENCOUNTER — Ambulatory Visit (INDEPENDENT_AMBULATORY_CARE_PROVIDER_SITE_OTHER): Payer: Medicare Other | Admitting: Cardiology

## 2019-05-11 VITALS — BP 150/64 | HR 78 | Ht 59.0 in | Wt 123.1 lb

## 2019-05-11 DIAGNOSIS — E039 Hypothyroidism, unspecified: Secondary | ICD-10-CM | POA: Diagnosis not present

## 2019-05-11 DIAGNOSIS — I10 Essential (primary) hypertension: Secondary | ICD-10-CM

## 2019-05-11 DIAGNOSIS — G243 Spasmodic torticollis: Secondary | ICD-10-CM

## 2019-05-11 DIAGNOSIS — M542 Cervicalgia: Secondary | ICD-10-CM

## 2019-05-11 DIAGNOSIS — E785 Hyperlipidemia, unspecified: Secondary | ICD-10-CM

## 2019-05-11 NOTE — Telephone Encounter (Signed)
Patient has an implant. I have little information about the implant.   Medtronic  Programmer 30-37 Serial # [id number]  I will need a new MRI order for [facility]. I will send the information to [facility] to see if it is MRI safe.

## 2019-05-11 NOTE — Telephone Encounter (Signed)
Pt's son Leslie Ward called wanting to see what he can do about getting the pt scheduled somewhere else to get her MRI done. Please advise.

## 2019-05-11 NOTE — Addendum Note (Signed)
Addended by: Kelle Darting A on: 05/11/2019 09:43 AM   Modules accepted: Orders

## 2019-05-11 NOTE — Progress Notes (Signed)
[facility] orders cancelled and re-ordered for [facility]. Pt also has lab order from cardiology but we are not able to draw cardiology labs due to billing issues. Gave pt orders to take to [facility] in cardiology to complete both sets of labs in 1 stick.

## 2019-05-11 NOTE — Telephone Encounter (Signed)
MRI cervical was ordered.

## 2019-05-11 NOTE — Patient Instructions (Signed)
Medication Instructions:  Your physician has recommended you make the following change in your medication:   DISCONTINUE pravastatin (pravachol)   If you need a refill on your cardiac medications before your next appointment, please call your pharmacy.   Lab work: Your physician recommends that you return for lab work today: lipid panel, CMP.   If you have labs (blood work) drawn today and your tests are completely normal, you will receive your results only by: [name] MyChart Message (if you have MyChart) OR . A paper copy in the mail If you have any lab test that is abnormal or we need to change your treatment, we will call you to review the results.  Testing/Procedures: None  Follow-Up: At [facility], you and your health needs are our priority.  As part of our continuing mission to provide you with exceptional heart care, we have created designated Provider Care Teams.  These Care Teams include your primary Cardiologist (physician) and Advanced Practice Providers (APPs -  Physician Assistants and Nurse Practitioners) who all work together to provide you with the care you need, when you need it. . You will need a follow up appointment in 3 months with Laurann Montana, NP.  Any Other Special Instructions Will Be Listed Below (If Applicable). **Please check your blood pressure twice daily in the late morning and evening after resting for 5 minutes.

## 2019-05-12 LAB — COMPREHENSIVE METABOLIC PANEL
ALT: 11 IU/L (ref 0–32)
AST: 13 IU/L (ref 0–40)
Albumin/Globulin Ratio: 1.6 (ref 1.2–2.2)
Albumin: 4.2 g/dL (ref 3.6–4.6)
Alkaline Phosphatase: 65 IU/L (ref 39–117)
BUN/Creatinine Ratio: 24 (ref 12–28)
BUN: 24 mg/dL (ref 8–27)
Bilirubin Total: 0.5 mg/dL (ref 0.0–1.2)
CO2: 24 mmol/L (ref 20–29)
Calcium: 9.2 mg/dL (ref 8.7–10.3)
Chloride: 104 mmol/L (ref 96–106)
Creatinine, Ser: 0.98 mg/dL (ref 0.57–1.00)
GFR calc Af Amer: 60 mL/min/{1.73_m2} (ref >59)
GFR calc non Af Amer: 52 mL/min/{1.73_m2} — ABNORMAL LOW (ref >59)
Globulin, Total: 2.6 g/dL (ref 1.5–4.5)
Glucose: 116 mg/dL — ABNORMAL HIGH (ref 65–99)
Potassium: 4.3 mmol/L (ref 3.5–5.2)
Sodium: 142 mmol/L (ref 134–144)
Total Protein: 6.8 g/dL (ref 6.0–8.5)

## 2019-05-12 LAB — LIPID PANEL
Chol/HDL Ratio: 4.4 ratio (ref 0.0–4.4)
Cholesterol, Total: 279 mg/dL — ABNORMAL HIGH (ref 100–199)
HDL: 64 mg/dL (ref >39)
LDL Chol Calc (NIH): 187 mg/dL — ABNORMAL HIGH (ref 0–99)
Triglycerides: 153 mg/dL — ABNORMAL HIGH (ref 0–149)
VLDL Cholesterol Cal: 28 mg/dL (ref 5–40)

## 2019-05-12 LAB — TSH: TSH: 0.425 u[IU]/mL — ABNORMAL LOW (ref 0.450–4.500)

## 2019-05-12 LAB — T4, FREE: Free T4: 1.56 ng/dL (ref 0.82–1.77)

## 2019-05-12 NOTE — Telephone Encounter (Signed)
Noted, faxed the information that I had to [facility].

## 2019-05-13 ENCOUNTER — Telehealth: Payer: Self-pay | Admitting: Medical

## 2019-05-13 MED ORDER — LEVOTHYROXINE SODIUM 50 MCG PO TABS: 50.0000 ug | ORAL_TABLET | Freq: Every day | ORAL | 0 refills | Status: DC

## 2019-05-13 NOTE — Telephone Encounter (Signed)
Reduced dosage levothyroxine today.

## 2019-05-14 NOTE — Telephone Encounter (Signed)
Opened to rx lower lower dose levothyroxine.

## 2019-05-17 NOTE — Telephone Encounter (Signed)
I faxed the medtronic card to [facility].

## 2019-05-17 NOTE — Telephone Encounter (Signed)
[facility] Radiology Dept. Scheduling Dept is asking for a call re: more information needed on pt's electronic devise

## 2019-05-19 ENCOUNTER — Encounter (HOSPITAL_COMMUNITY): Payer: Self-pay

## 2019-05-19 ENCOUNTER — Telehealth: Payer: Self-pay | Admitting: Neurology

## 2019-05-19 NOTE — Telephone Encounter (Signed)
Morey Hummingbird from [facility] Radiology called and stated the pts medtronic device isnt eligible only thing that can be done is MRI of head/brain  CB# [phone]

## 2019-05-19 NOTE — Addendum Note (Signed)
Addended by: Marcial Pacas on: 05/19/2019 01:52 PM   Modules accepted: Orders

## 2019-05-19 NOTE — Progress Notes (Signed)
[employer] Device per [name] of [facility] MRI Dept [phone] patient can only have an MRI of the Head

## 2019-05-19 NOTE — Telephone Encounter (Signed)
[employer] medicare/medicaid or sent to GI

## 2019-05-19 NOTE — Telephone Encounter (Signed)
FYI how would like to proceed?

## 2019-05-19 NOTE — Telephone Encounter (Signed)
Error

## 2019-05-19 NOTE — Telephone Encounter (Signed)
I will change to CT cervical

## 2019-05-27 ENCOUNTER — Encounter: Payer: Self-pay | Admitting: Medical

## 2019-05-28 ENCOUNTER — Telehealth: Payer: Self-pay | Admitting: Medical

## 2019-05-28 NOTE — Telephone Encounter (Signed)
Called pt informed pt to schedule her flu shot, pt stated will have son to call to schedule flu appt since son has schedule for when he can bring the pt to the office.

## 2019-05-31 ENCOUNTER — Other Ambulatory Visit: Payer: Self-pay | Admitting: *Deleted

## 2019-05-31 MED ORDER — TELMISARTAN 20 MG PO TABS: ORAL_TABLET | ORAL | 0 refills | Status: DC

## 2019-06-01 ENCOUNTER — Ambulatory Visit
Admission: RE | Admit: 2019-06-01 | Discharge: 2019-06-01 | Disposition: A | Discharge status: Home / Self Care | Payer: Medicare Other | Source: Ambulatory Visit | Attending: Neurology | Admitting: Neurology

## 2019-06-01 ENCOUNTER — Encounter: Payer: Self-pay | Admitting: Medical

## 2019-06-01 ENCOUNTER — Telehealth: Payer: Self-pay | Admitting: Neurology

## 2019-06-01 ENCOUNTER — Other Ambulatory Visit: Payer: Self-pay

## 2019-06-01 ENCOUNTER — Telehealth: Payer: Self-pay | Admitting: Medical

## 2019-06-01 DIAGNOSIS — M4802 Spinal stenosis, cervical region: Secondary | ICD-10-CM | POA: Diagnosis not present

## 2019-06-01 DIAGNOSIS — E041 Nontoxic single thyroid nodule: Secondary | ICD-10-CM

## 2019-06-01 DIAGNOSIS — G243 Spasmodic torticollis: Secondary | ICD-10-CM

## 2019-06-01 DIAGNOSIS — M542 Cervicalgia: Secondary | ICD-10-CM

## 2019-06-01 IMAGING — CT CT CERVICAL SPINE W/O CM
2 series · 10 of 14 positions shown, 12 images · non-contrast
Comparison: None.

CLINICAL DATA: Chronic neck pain. Cervical dystonia. No acute
injury or prior relevant surgery.

EXAM:
CT CERVICAL SPINE WITHOUT CONTRAST
TECHNIQUE: Multidetector CT imaging of the cervical spine was performed without
intravenous contrast. Multiplanar CT image reconstructions were also
generated.

[Series 3: cspine soft · axial · 0.26mm/px · z∈[-214,-94]mm · 5 of 90 slices shown]
[im 15/90  soft-tissue]
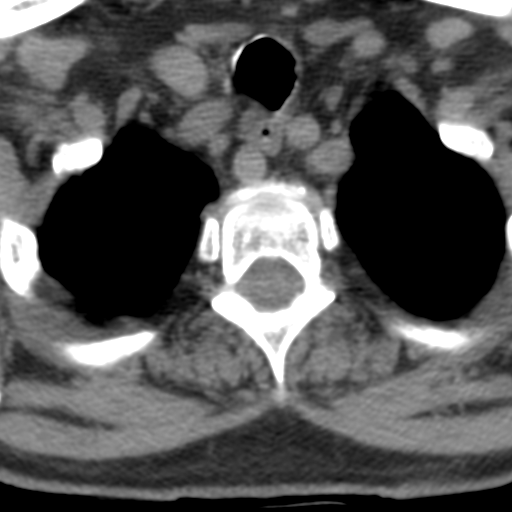
[im 30/90  soft-tissue]
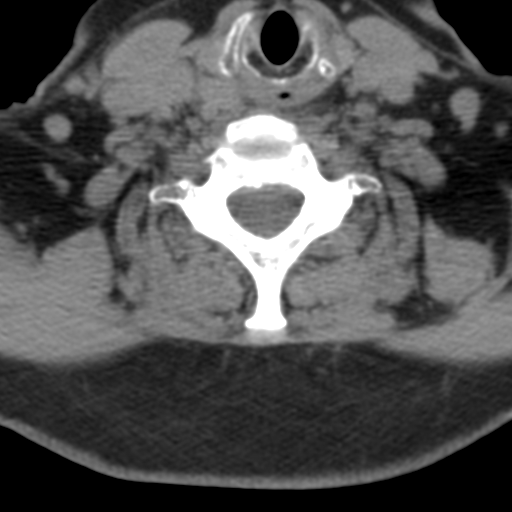
[im 45/90  soft-tissue]
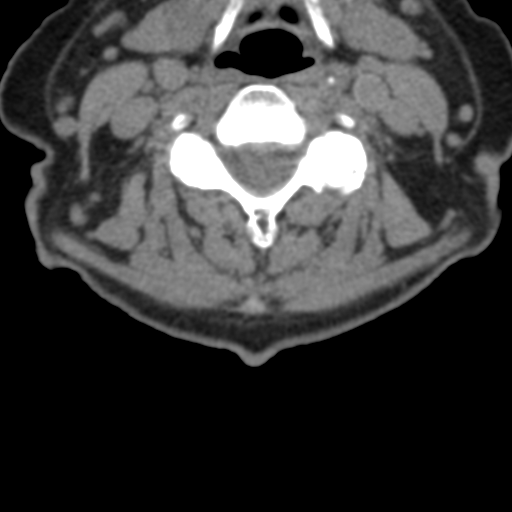
[im 60/90  soft-tissue]
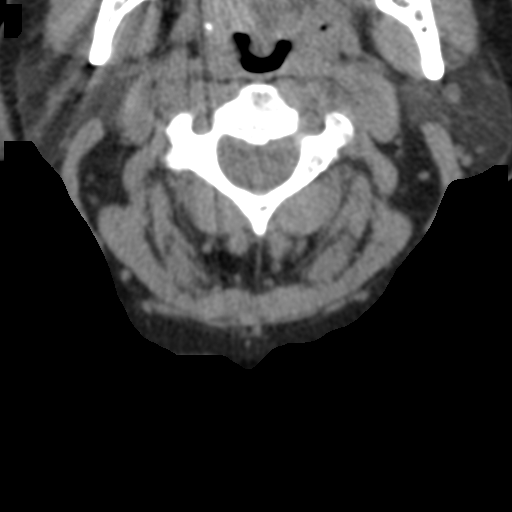
[im 75/90  soft-tissue]
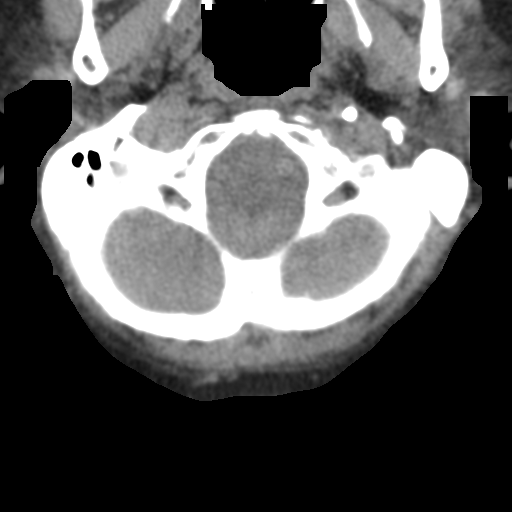

[Series 9: angled axial · axial · 0.29mm/px · z∈[-230,-117]mm · 5 of 89 slices shown, 7 images]
[im 15/89  soft-tissue]
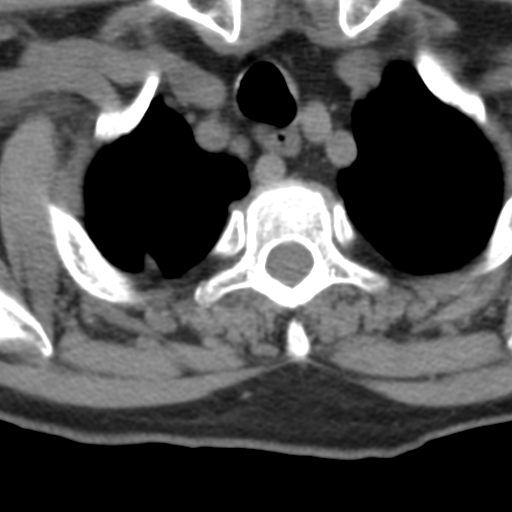
[im 15/89  bone]
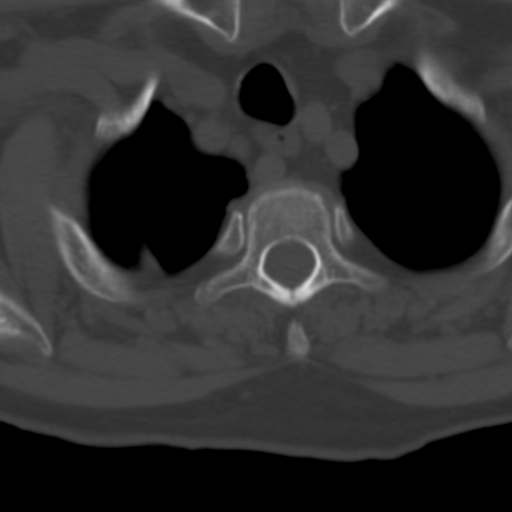
[im 30/89  bone]
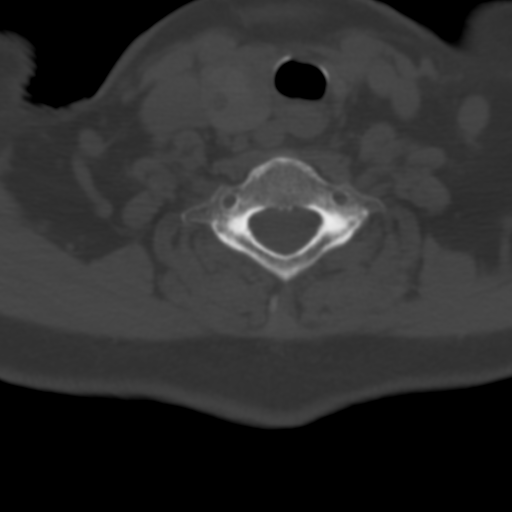
[im 45/89  bone]
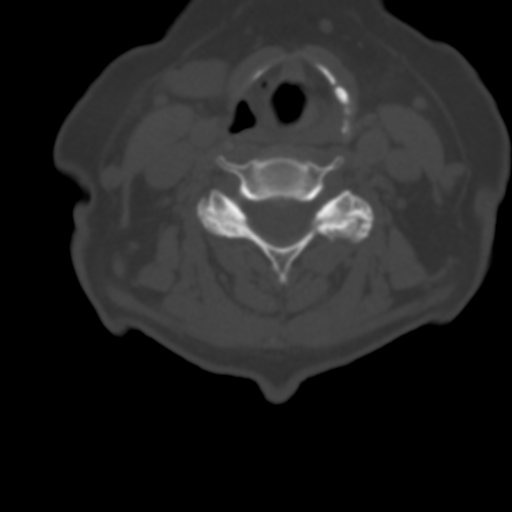
[im 59/89  bone]
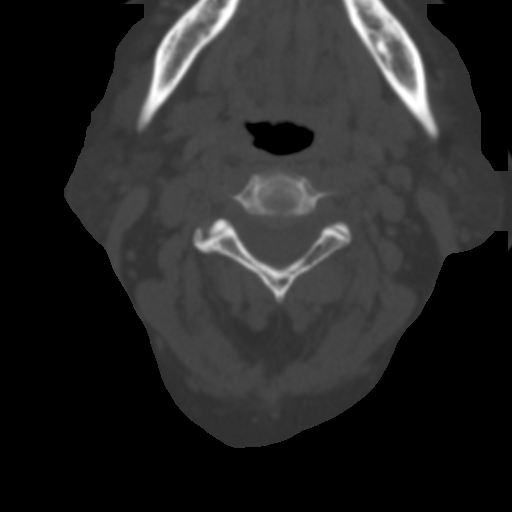
[im 74/89  soft-tissue]
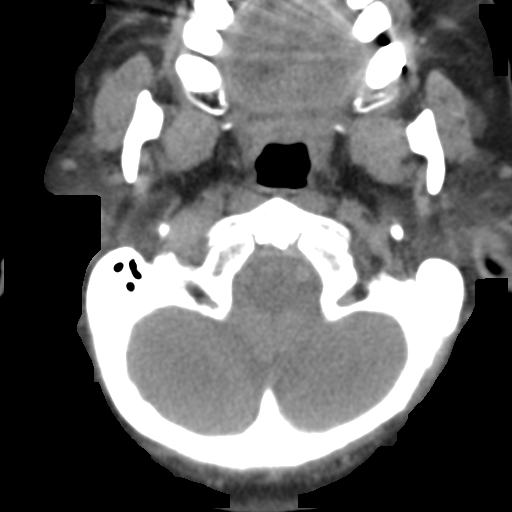
[im 74/89  bone]
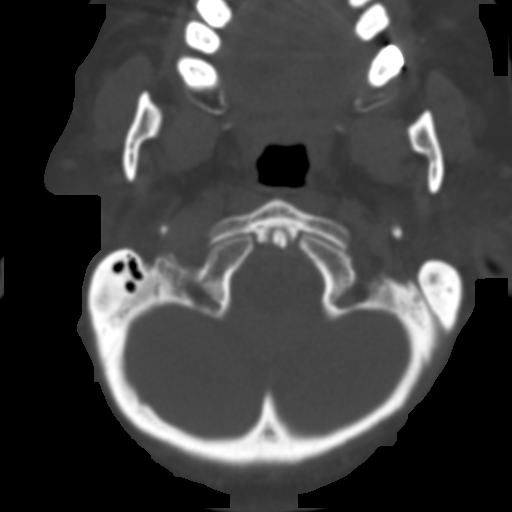

[10 of 14 positions shown; findings below may reference images not displayed]

FINDINGS: Alignment: Near anatomic. There is a slight degenerative
anterolisthesis at each level from C3-4 through C6-7.

Skull base and vertebrae: No evidence of acute fracture or traumatic
subluxation. There are mild facet degenerative changes throughout
the cervical spine.

Soft tissues and spinal canal: No paraspinal abnormalities are
identified. There is a heterogeneous right thyroid nodule measuring
2.2 x 1.6 cm on image 67/3. Mucosal thickening is present in the
right division of the sphenoid sinus.

Disc levels:

C2-3: Small central disc protrusion. The spinal canal and neural
foramina are widely patent.

C3-4: Moderate-sized central disc protrusion with mild uncinate
spurring and facet hypertrophy. Mild resulting cord flattening with
narrowing of the AP diameter of the canal to 8 mm and mild left
foraminal narrowing.

C4-5: Small central disc protrusion with mild uncinate spurring and
facet hypertrophy bilaterally. Mild foraminal narrowing bilaterally.

C5-6: Bilateral uncinate spurring and facet hypertrophy. Mild right
foraminal narrowing.

C6-7: Mild uncinate spurring and facet hypertrophy. Both foramina
are sufficiently patent.

C7-T1: Mild bilateral facet hypertrophy. Both foramina are
sufficiently patent.

Upper chest: Mild atherosclerosis of the aorta and great vessels.
There is an aberrant retroesophageal right subclavian artery.

Other: None.
IMPRESSION: 1. No acute findings.
2. Multilevel cervical spondylosis as described with disc
protrusions, uncinate spurring and facet hypertrophy. Central disc
protrusion at C3-4 causes mild cord flattening and mild left
foraminal narrowing.
3. Indeterminate 2.2 cm right thyroid nodule. Recommend further
evaluation with thyroid ultrasound. This follows ACR consensus
guidelines: Managing Incidental Thyroid Nodules Detected on Imaging:
White Paper of [REDACTED]. [HOSPITAL], Published On-line: [DATE].
4. Aortic Atherosclerosis ([LE]-[LE]). Aberrant retroesophageal
right subclavian artery.

## 2019-06-01 NOTE — Telephone Encounter (Signed)
I spoke to her son, [place], on [place] who speaks english.  He verbalized understanding of her results and schedule a follow up with her PCP.

## 2019-06-01 NOTE — Telephone Encounter (Signed)
I placed order for thyroid ultrasound since thyroid nodule was seen on ct neck. Will you notify pt and her son.

## 2019-06-01 NOTE — Telephone Encounter (Signed)
IMPRESSION: 1. No acute findings. 2. Multilevel cervical spondylosis as described with disc protrusions, uncinate spurring and facet hypertrophy. Central disc protrusion at C3-4 causes mild cord flattening and mild left foraminal narrowing. 3. Indeterminate 2.2 cm right thyroid nodule. Recommend further evaluation with thyroid ultrasound. This follows ACR consensus guidelines: Managing Incidental Thyroid Nodules Detected on Imaging: White Paper of the ACR Incidental Thyroid Findings Committee. J AM Coll Radiol, Published On-line: June 12, 2013. 4. Aortic Atherosclerosis (ICD10-I70.0). Aberrant retroesophageal right subclavian artery.   Please call patient CT cervical showed multilevel degenerative changes, there was no significant canal or foraminal narrowing,  Right thyroid nodule 2.2 cm, I have forward result to her primary care physician Dr. Mackie Pai, she may continue follow-up with him about her thyroid findings.

## 2019-06-02 ENCOUNTER — Telehealth: Payer: Self-pay

## 2019-06-02 NOTE — Telephone Encounter (Signed)
Pt son has called Danielle back, he is asking for a call back

## 2019-06-02 NOTE — Telephone Encounter (Signed)
Notified pts son

## 2019-06-02 NOTE — Telephone Encounter (Signed)
I called the patients son (listed on DPR). He did not answer so I left a VM asking him to call back. DW

## 2019-06-07 NOTE — Telephone Encounter (Signed)
Pt son has called back to confirm the scheduled date and time will work for pt.  Pt will be check in at 2:45 on 11-11 no call back requested.

## 2019-06-07 NOTE — Telephone Encounter (Signed)
I called the son back but he did not answer. I left a VM asking him to call back. I went ahead and scheduled an apt, please confirm this apt date and time works when he calls back. DW

## 2019-06-08 NOTE — Telephone Encounter (Signed)
Noted, thank you. DW  °

## 2019-06-11 ENCOUNTER — Ambulatory Visit (INDEPENDENT_AMBULATORY_CARE_PROVIDER_SITE_OTHER): Payer: Medicare Other

## 2019-06-11 ENCOUNTER — Ambulatory Visit (HOSPITAL_BASED_OUTPATIENT_CLINIC_OR_DEPARTMENT_OTHER)
Admission: RE | Admit: 2019-06-11 | Discharge: 2019-06-11 | Disposition: A | Discharge status: Home / Self Care | Payer: Medicare Other | Source: Ambulatory Visit | Attending: Medical | Admitting: Medical

## 2019-06-11 ENCOUNTER — Other Ambulatory Visit: Payer: Self-pay

## 2019-06-11 DIAGNOSIS — E041 Nontoxic single thyroid nodule: Secondary | ICD-10-CM | POA: Diagnosis not present

## 2019-06-11 DIAGNOSIS — Z23 Encounter for immunization: Secondary | ICD-10-CM

## 2019-06-11 IMAGING — US US THYROID
1 series · 13 of 25 positions shown · non-contrast
Comparison: CT [DATE]

CLINICAL DATA: 88-year-old female with a history of thyroid nodule

EXAM:
THYROID ULTRASOUND
TECHNIQUE: Ultrasound examination of the thyroid gland and adjacent soft
tissues was performed.

[Series 1: us thyroid · 13 of 25 slices shown]
[im 1/25]
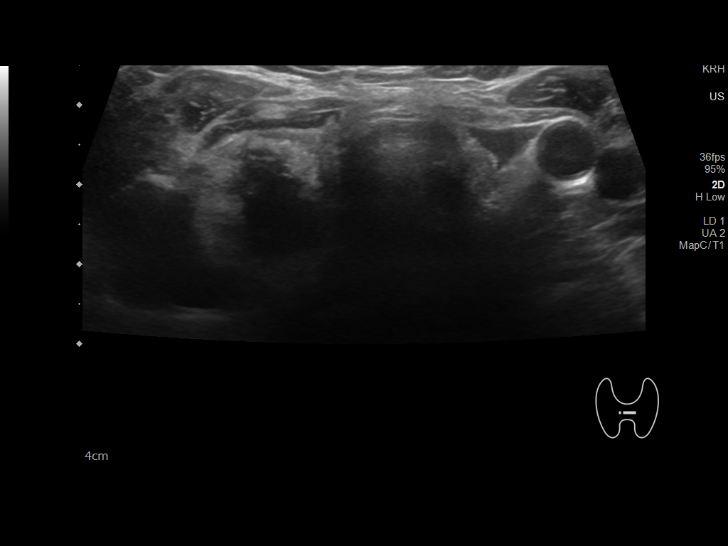
[im 3/25]
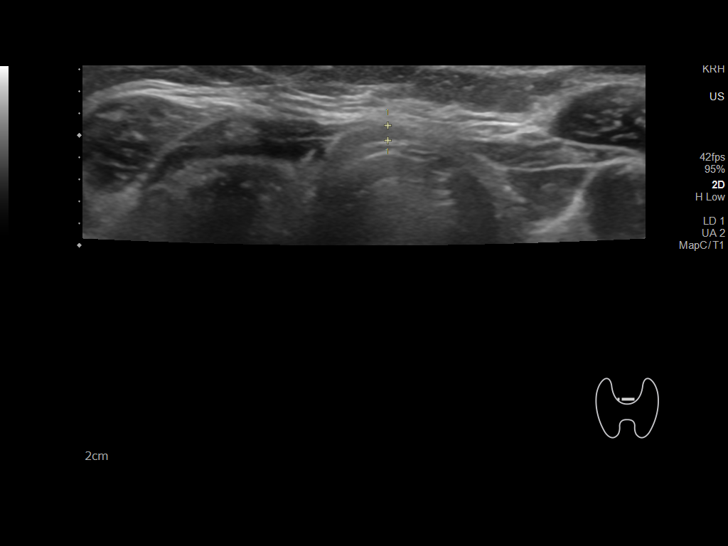
[im 5/25]
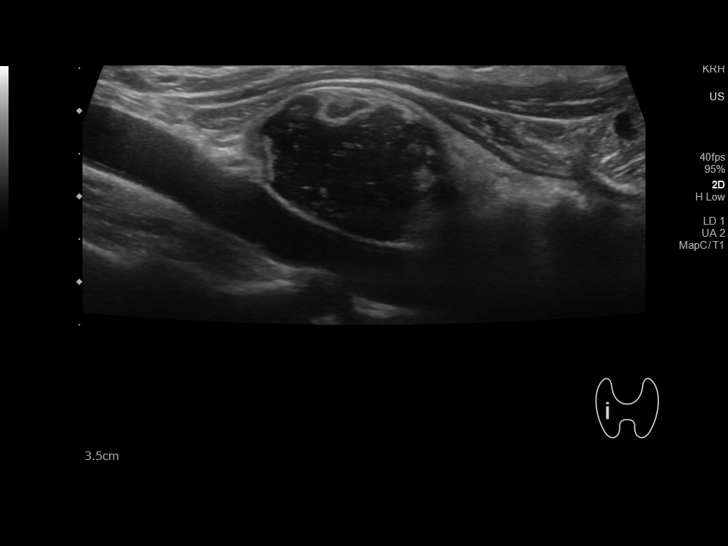
[im 7/25]
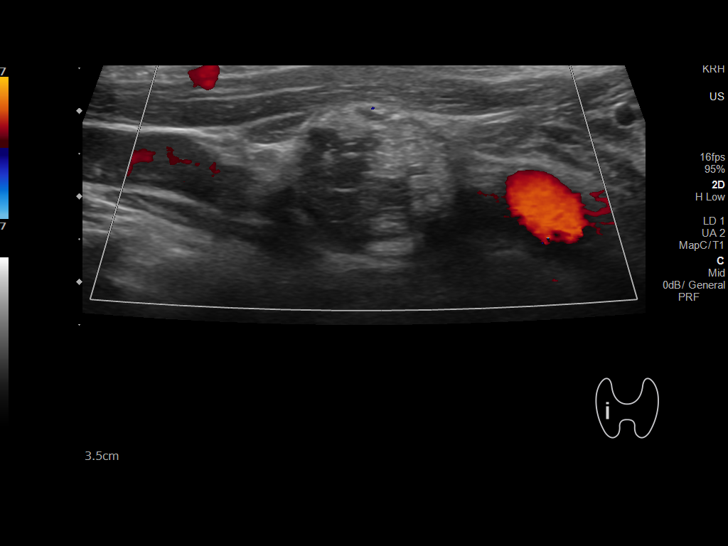
[im 9/25]
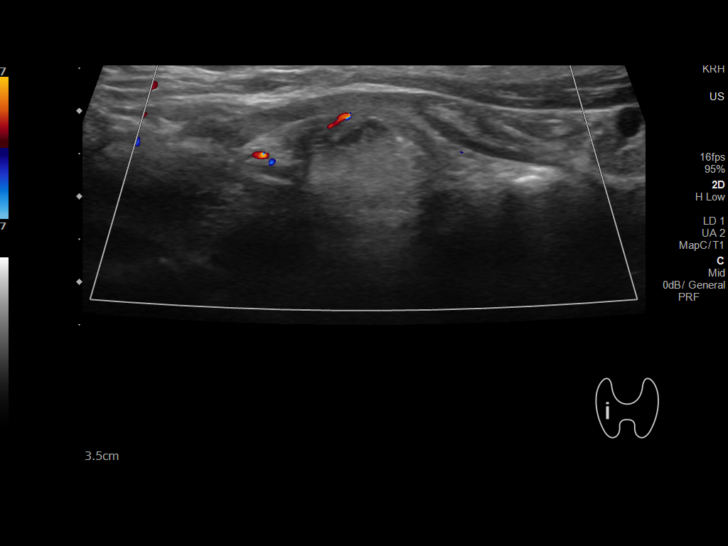
[im 11/25]
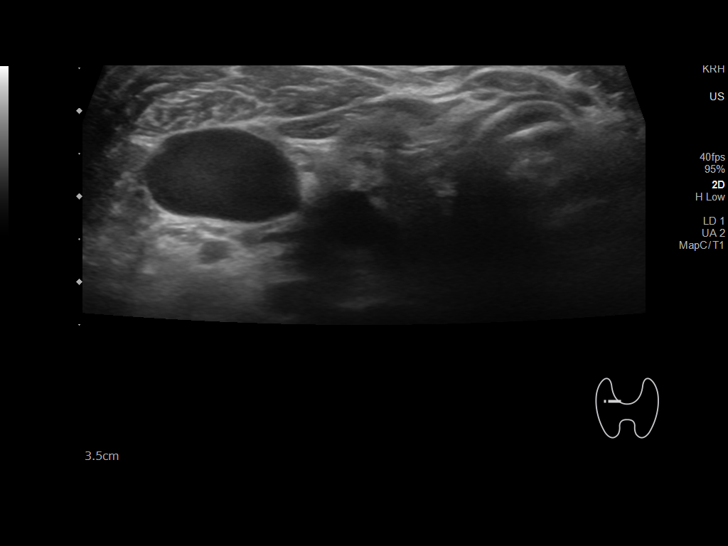
[im 13/25]
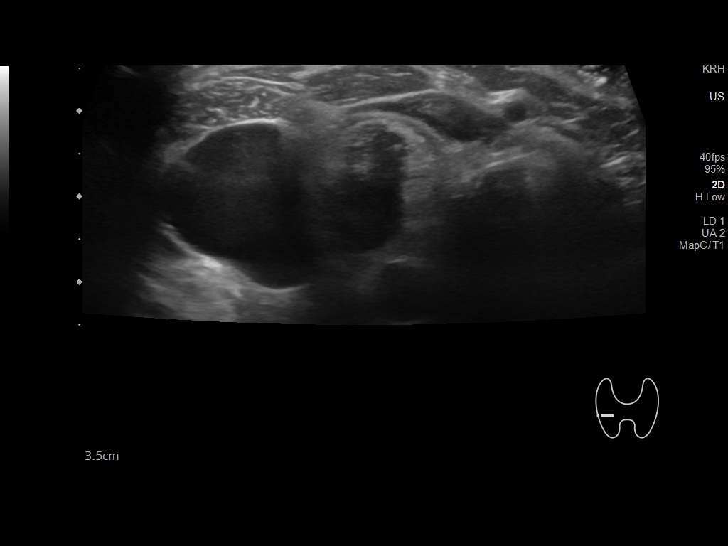
[im 15/25]
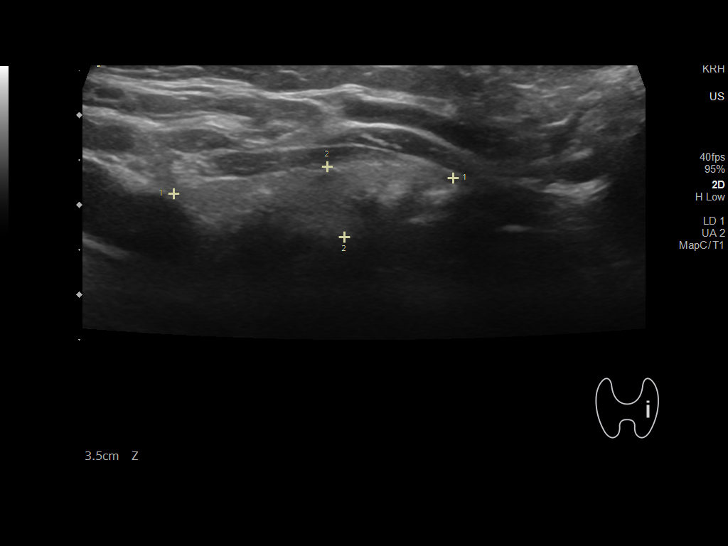
[im 17/25]
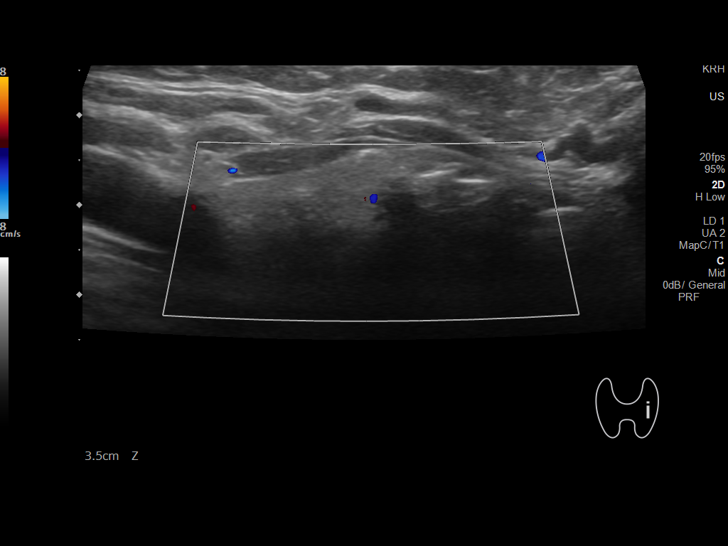
[im 19/25]
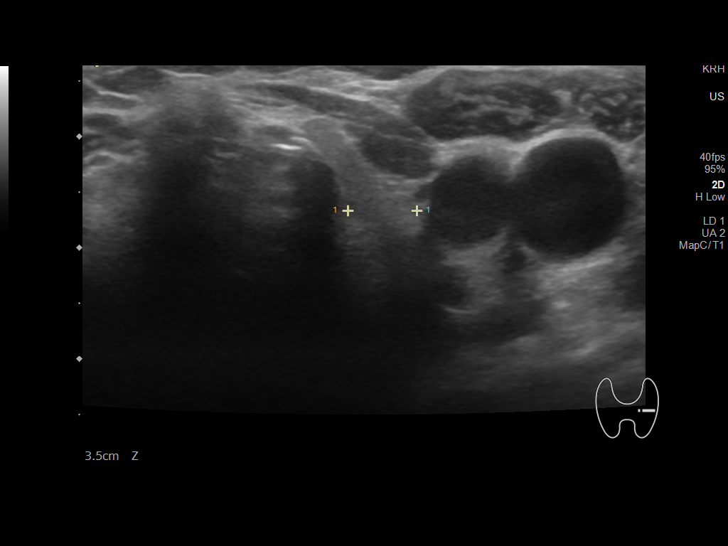
[im 21/25]
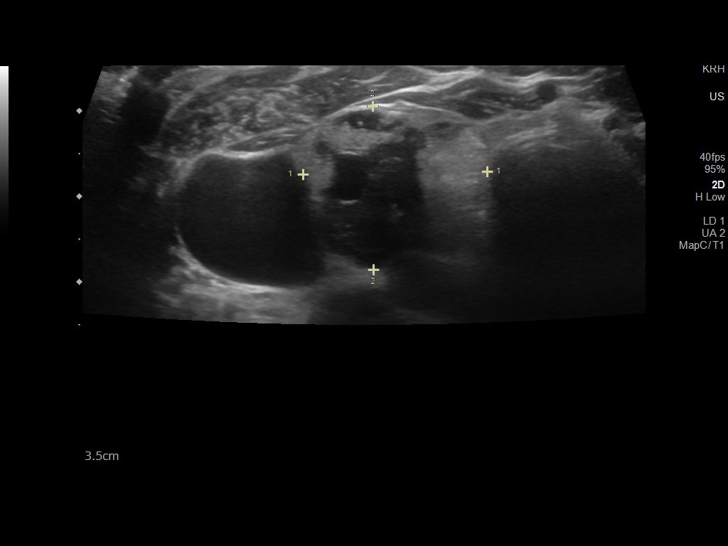
[im 23/25]
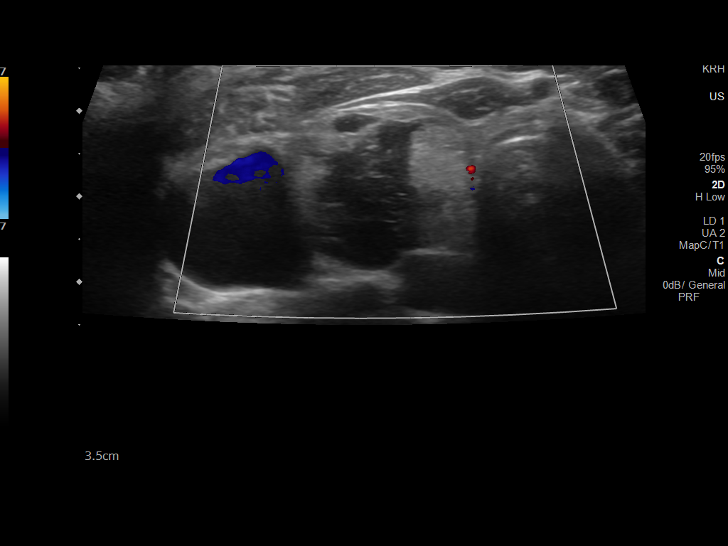
[im 25/25]
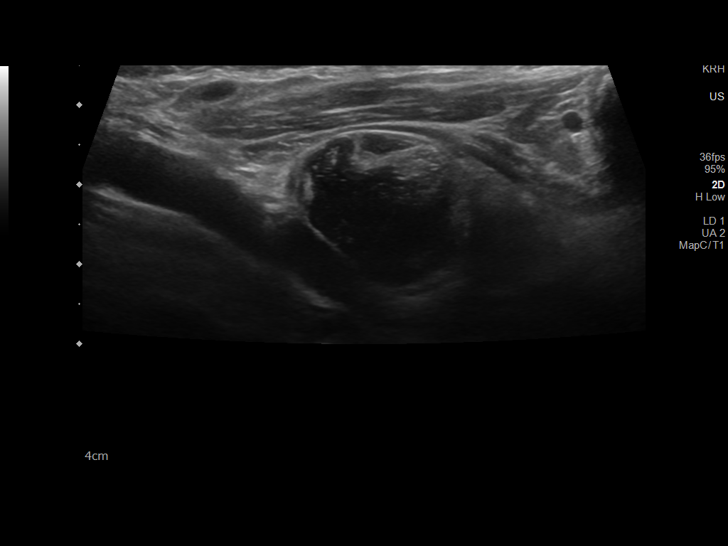

[13 of 25 positions shown; findings below may reference images not displayed]

FINDINGS: Parenchymal Echotexture: Mildly heterogenous

Isthmus: 0.1 cm

Right lobe: 3.6 cm x 1.8 cm x 2.2 cm

Left lobe: 3.1 cm x 0.8 cm x 0.6 cm

_________________________________________________________

Estimated total number of nodules >/= 1 cm: 1

Number of spongiform nodules >/=  2 cm not described below (TR1): 0

Number of mixed cystic and solid nodules >/= 1.5 cm not described
below (TR2): 0

_________________________________________________________

Nodule # 1:

Location: Right; Mid

Maximum size: 2.6 cm; Other 2 dimensions: 1.9 cm x 2.1 cm

Composition: mixed cystic and solid (1)

Echogenicity: isoechoic (1)

Shape: not taller-than-wide (0)

Margins: ill-defined (0)

Echogenic foci: none (0)

ACR TI-RADS total points: 2.

ACR TI-RADS risk category: TR2 (2 points).

ACR TI-RADS recommendations:

Cystic nodule does not meet criteria for surveillance or biopsy.

_________________________________________________________

No adenopathy
IMPRESSION: Right-sided thyroid nodule does not meet criteria for surveillance
or biopsy, as designated by the newly established ACR TI-RADS
criteria.

Recommendations follow those established by the new ACR TI-RADS
criteria ([HOSPITAL] [UJ];[DATE]).

## 2019-06-20 ENCOUNTER — Encounter: Payer: Self-pay | Admitting: Family Medicine

## 2019-06-23 ENCOUNTER — Other Ambulatory Visit: Payer: Self-pay

## 2019-06-23 ENCOUNTER — Encounter: Payer: Self-pay | Admitting: Family Medicine

## 2019-06-23 ENCOUNTER — Ambulatory Visit: Payer: Self-pay

## 2019-06-23 ENCOUNTER — Ambulatory Visit (INDEPENDENT_AMBULATORY_CARE_PROVIDER_SITE_OTHER): Payer: Medicare Other | Admitting: Neurology

## 2019-06-23 ENCOUNTER — Ambulatory Visit (INDEPENDENT_AMBULATORY_CARE_PROVIDER_SITE_OTHER): Payer: Medicare Other | Admitting: Family Medicine

## 2019-06-23 ENCOUNTER — Encounter: Payer: Self-pay | Admitting: Neurology

## 2019-06-23 VITALS — BP 145/81 | HR 73 | Ht 59.0 in | Wt 123.0 lb

## 2019-06-23 VITALS — BP 122/75 | HR 71 | Temp 97.1°F | Ht 59.0 in | Wt 125.5 lb

## 2019-06-23 DIAGNOSIS — G243 Spasmodic torticollis: Secondary | ICD-10-CM

## 2019-06-23 DIAGNOSIS — M7502 Adhesive capsulitis of left shoulder: Secondary | ICD-10-CM

## 2019-06-23 MED ORDER — KETOROLAC TROMETHAMINE 30 MG/ML IJ SOLN
30.0000 mg | Freq: Once | INTRAMUSCULAR | Status: AC
  Administered 2019-06-23: 30 mg via INTRA_ARTICULAR

## 2019-06-23 MED ORDER — INCOBOTULINUMTOXINA 100 UNITS IM SOLR
100.0000 [IU] | INTRAMUSCULAR | Status: DC
  Administered 2019-06-23: 100 [IU] via INTRAMUSCULAR

## 2019-06-23 NOTE — Assessment & Plan Note (Signed)
Acute exacerbation of the underlying chronic shoulder pain.  X-ray from 2019 does not demonstrate significant arthritic change.  But degenerative changes could be playing a role.  Still seems mostly adhesive capsulitis at this point.  May have some underlying rotator cuff tendinitis as well. -Glenohumeral injection. -Counseled on home exercise therapy and supportive care. -Provided sample of Rayos. -Could consider home health physical therapy or formal physical therapy.

## 2019-06-23 NOTE — Progress Notes (Signed)
Medication Samples have been provided to the patient.  Drug name: Rayos       Strength: 5mg         Qty: 1 Box  LOT: [id number]  Exp.Date: 12/2019  Dosing instructions: Take 2 tablets at bedtime  The patient has been instructed regarding the correct time, dose, and frequency of taking this medication, including desired effects and most common side effects.   Sherrie George, MA 10:40 AM 06/23/2019
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
## 2019-06-23 NOTE — Progress Notes (Signed)
**  Xeomin 100 units x 1, [place] 0259-1610-01, Lot [id number], Exp 04/2021, office supply.//mck,rn**

## 2019-06-23 NOTE — Patient Instructions (Signed)
Good to see you  Please try heat before the exercises and ice after  Please try the exercises  Please try the rayos at night and as close to 10 pm as possible  Please send me a message in MyChart with any questions or updates.  Please see me back in 6-8 weeks.   --Dr. Raeford Razor

## 2019-06-23 NOTE — Progress Notes (Signed)
Leslie Ward - 85 y.o. female MRN [id number]  Date of birth: 11-Nov-1930  SUBJECTIVE:  Including CC & ROS.  Chief Complaint  Patient presents with  . Follow-up    follow up for left shoulder    Leslie Ward is a 85 y.o. female that is presenting with acute worsening of the left shoulder pain.  She was seen earlier this year and provided a glenohumeral injection.  She reported mild improvement with that injection.  She is also been through physical therapy a few times.  She is afraid to go through further episodes of physical therapy due to the coronavirus.  The pain is localized to the shoulder.  She has limited range of motion.  She denies any numbness or tingling.  Denies any falls.  She would like to try to do the exercises at home.  The pain is intermittent in nature.  She has no pain if she is not moving the shoulder.  The pain can be severe and sharp.  Independent review of the left shoulder x-ray from 6/22 shows no significant abnormality.   Review of Systems  Constitutional: Negative for fever.  HENT: Negative for congestion.   Respiratory: Negative for cough.   Cardiovascular: Negative for chest pain.  Gastrointestinal: Negative for abdominal pain.  Musculoskeletal: Positive for arthralgias.  Skin: Negative for color change.  Neurological: Negative for weakness.  Hematological: Negative for adenopathy.    HISTORY: Past Medical, Surgical, Social, and Family History Reviewed & Updated per EMR.   Pertinent Historical Findings include:  Past Medical History:  Diagnosis Date  . Dystonia   . Hypertension   . Thyroid disease     Past Surgical History:  Procedure Laterality Date  . NO PAST SURGERIES      Allergies  Allergen Reactions  . Crestor [Rosuvastatin Calcium] Other (See Comments)    "caused me to go into kidney failure"    Family History  Problem Relation Age of Onset  . Stroke Mother   . Heart attack Father   . Heart Problems Brother      Social  History   Socioeconomic History  . Marital status: Single    Spouse name: Not on file  . Number of children: 1  . Years of education: 6 or 7 years of education  . Highest education level: Not on file  Occupational History  . Occupation: Retired  Scientific laboratory technician  . Financial resource strain: Not on file  . Food insecurity    Worry: Not on file    Inability: Not on file  . Transportation needs    Medical: Not on file    Non-medical: Not on file  Tobacco Use  . Smoking status: Never Smoker  . Smokeless tobacco: Never Used  Substance and Sexual Activity  . Alcohol use: No  . Drug use: No  . Sexual activity: Not Currently  Lifestyle  . Physical activity    Days per week: Not on file    Minutes per session: Not on file  . Stress: Not on file  Relationships  . Social Herbalist on phone: Not on file    Gets together: Not on file    Attends religious service: Not on file    Active member of club or organization: Not on file    Attends meetings of clubs or organizations: Not on file    Relationship status: Not on file  . Intimate partner violence    Fear of current or ex partner: Not  on file    Emotionally abused: Not on file    Physically abused: Not on file    Forced sexual activity: Not on file  Other Topics Concern  . Not on file  Social History Narrative   Lives alone.   Right-handed.   No caffeine use.     PHYSICAL EXAM:  VS: BP (!) 145/81   Pulse 73   Ht 4\' 11"  (1.499 m)   Wt 123 lb (55.8 kg)   BMI 24.84 kg/m  Physical Exam Gen: NAD, alert, cooperative with exam, well-appearing ENT: normal lips, normal nasal mucosa,  Eye: normal EOM, normal conjunctiva and lids CV:  no edema, +2 pedal pulses   Resp: no accessory muscle use, non-labored,  Skin: no rashes, no areas of induration  Neuro: normal tone, normal sensation to touch Psych:  normal insight, alert and oriented MSK:  Left shoulder: Limited active flexion and abduction. Limited external  rotation when compared to the contralateral side. Normal internal rotation. Normal strength resistance. Pain with empty can testing. Pain with Hawkins test. Neurovascularly intact   Aspiration/Injection Procedure Note Leslie Ward [birth year]  Procedure: Injection Indications: Left shoulder pain  Procedure Details Consent: Risks of procedure as well as the alternatives and risks of each were explained to the (patient/caregiver).  Consent for procedure obtained. Time Out: Verified patient identification, verified procedure, site/side was marked, verified correct patient position, special equipment/implants available, medications/allergies/relevent history reviewed, required imaging and test results available.  Performed.  The area was cleaned with iodine and alcohol swabs.    The left glenohumeral joint was injected using 1 cc's of 30 mg Toradol and 4 cc's of 0.25% bupivacaine with a 22 1 1/2" needle.  Ultrasound was used. Images were obtained in short views showing the injection.     A sterile dressing was applied.  Patient did tolerate procedure well.     ASSESSMENT & PLAN:   Adhesive capsulitis of left shoulder Acute exacerbation of the underlying chronic shoulder pain.  X-ray from 2019 does not demonstrate significant arthritic change.  But degenerative changes could be playing a role.  Still seems mostly adhesive capsulitis at this point.  May have some underlying rotator cuff tendinitis as well. -Glenohumeral injection. -Counseled on home exercise therapy and supportive care. -Provided sample of Rayos. -Could consider home health physical therapy or formal physical therapy.

## 2019-07-15 ENCOUNTER — Encounter: Payer: Self-pay | Admitting: Medical

## 2019-07-17 ENCOUNTER — Encounter: Payer: Self-pay | Admitting: Family

## 2019-07-22 MED ORDER — AMLODIPINE BESYLATE 5 MG PO TABS: 5.0000 mg | ORAL_TABLET | Freq: Two times a day (BID) | ORAL | 1 refills | Status: DC

## 2019-07-22 NOTE — Addendum Note (Signed)
Addended by: Loel Dubonnet on: 07/22/2019 10:52 AM   Modules accepted: Orders

## 2019-07-26 NOTE — Progress Notes (Deleted)
Cardiology Office Note:    Date:  07/26/2019   ID:  Leslie Ward, DOB 1930-11-19, MRN [id number]  PCP:  [name], PA-C  Cardiologist:  Norman Herrlich, MD    Referring MD: [name], PA-C    ASSESSMENT:    No diagnosis found. PLAN:    In order of problems listed above:  1. ***   Next appointment: ***   Medication Adjustments/Labs and Tests Ordered: Current medicines are reviewed at length with the patient today.  Concerns regarding medicines are outlined above.  No orders of the defined types were placed in this encounter.  No orders of the defined types were placed in this encounter.   No chief complaint on file.   History of Present Illness:    Leslie Ward is a 85 y.o. female with a hx of hypertension hyperlipidemia with statin intolerance and type 2 diabetes seen 02/11/2019 virtual visit.  With complaints of edema follow-up proBNP level N-terminal was low and not consistent with heart failure at 199  She was last seen 05/11/2019. Compliance with diet, lifestyle and medications: *** Past Medical History:  Diagnosis Date  . Dystonia   . Hypertension   . Thyroid disease     Past Surgical History:  Procedure Laterality Date  . NO PAST SURGERIES      Current Medications: No outpatient medications have been marked as taking for the 07/27/19 encounter (Appointment) with Baldo Daub, MD.   Current Facility-Administered Medications for the 07/27/19 encounter (Appointment) with Baldo Daub, MD  Medication  . incobotulinumtoxinA (XEOMIN) 100 units injection 100 Units     Allergies:   Crestor [rosuvastatin calcium]   Social History   Socioeconomic History  . Marital status: Single    Spouse name: Not on file  . Number of children: 1  . Years of education: 6 or 7 years of education  . Highest education level: Not on file  Occupational History  . Occupation: Retired  Tobacco Use  . Smoking status: Never Smoker  . Smokeless  tobacco: Never Used  Substance and Sexual Activity  . Alcohol use: No  . Drug use: No  . Sexual activity: Not Currently  Other Topics Concern  . Not on file  Social History Narrative   Lives alone.   Right-handed.   No caffeine use.   Social Determinants of Health   Financial Resource Strain:   . Difficulty of Paying Living Expenses: Not on file  Food Insecurity:   . Worried About Programme researcher, broadcasting/film/video in the Last Year: Not on file  . Ran Out of Food in the Last Year: Not on file  Transportation Needs:   . Lack of Transportation (Medical): Not on file  . Lack of Transportation (Non-Medical): Not on file  Physical Activity:   . Days of Exercise per Week: Not on file  . Minutes of Exercise per Session: Not on file  Stress:   . Feeling of Stress : Not on file  Social Connections:   . Frequency of Communication with Friends and Family: Not on file  . Frequency of Social Gatherings with Friends and Family: Not on file  . Attends Religious Services: Not on file  . Active Member of Clubs or Organizations: Not on file  . Attends Banker Meetings: Not on file  . Marital Status: Not on file     Family History: The patient's ***family history includes Heart Problems in her brother; Heart attack in her father; Stroke in her mother. ROS:  Please see the history of present illness.    All other systems reviewed and are negative.  EKGs/Labs/Other Studies Reviewed:    The following studies were reviewed today:  EKG:  EKG ordered today and personally reviewed.  The ekg ordered today demonstrates ***  Recent Labs: 02/11/2019: NT-Pro BNP 199 05/11/2019: ALT 11; BUN 24; Creatinine, Ser 0.98; Potassium 4.3; Sodium 142; TSH 0.425  Recent Lipid Panel    Component Value Date/Time   CHOL 279 (H) 05/11/2019 1007   TRIG 153 (H) 05/11/2019 1007   HDL 64 05/11/2019 1007   CHOLHDL 4.4 05/11/2019 1007   LDLCALC 187 (H) 05/11/2019 1007    Physical Exam:    VS:  There were no  vitals taken for this visit.    Wt Readings from Last 3 Encounters:  06/23/19 125 lb 8 oz (56.9 kg)  06/23/19 123 lb (55.8 kg)  05/11/19 123 lb 1.9 oz (55.8 kg)     GEN: *** Well nourished, well developed in no acute distress HEENT: Normal NECK: No JVD; No carotid bruits LYMPHATICS: No lymphadenopathy CARDIAC: ***RRR, no murmurs, rubs, gallops RESPIRATORY:  Clear to auscultation without rales, wheezing or rhonchi  ABDOMEN: Soft, non-tender, non-distended MUSCULOSKELETAL:  No edema; No deformity  SKIN: Warm and dry NEUROLOGIC:  Alert and oriented x 3 PSYCHIATRIC:  Normal affect    Signed, Shirlee More, MD  07/26/2019 8:45 AM    [place]

## 2019-07-27 ENCOUNTER — Ambulatory Visit: Payer: Medicare Other | Admitting: Cardiology

## 2019-07-28 ENCOUNTER — Telehealth (INDEPENDENT_AMBULATORY_CARE_PROVIDER_SITE_OTHER): Payer: Medicare Other | Admitting: Cardiology

## 2019-07-28 ENCOUNTER — Encounter: Payer: Self-pay | Admitting: Cardiology

## 2019-07-28 VITALS — BP 122/56 | HR 75 | Ht 59.0 in | Wt 126.0 lb

## 2019-07-28 DIAGNOSIS — I10 Essential (primary) hypertension: Secondary | ICD-10-CM | POA: Diagnosis not present

## 2019-07-28 DIAGNOSIS — Z7189 Other specified counseling: Secondary | ICD-10-CM | POA: Diagnosis not present

## 2019-07-28 DIAGNOSIS — E785 Hyperlipidemia, unspecified: Secondary | ICD-10-CM | POA: Diagnosis not present

## 2019-07-28 NOTE — Patient Instructions (Signed)
Medication Instructions:  Your physician recommends that you continue on your current medications as directed. Please refer to the Current Medication list given to you today.  *If you need a refill on your cardiac medications before your next appointment, please call your pharmacy*  Lab Work: None  If you have labs (blood work) drawn today and your tests are completely normal, you will receive your results only by: . MyChart Message (if you have MyChart) OR . A paper copy in the mail If you have any lab test that is abnormal or we need to change your treatment, we will call you to review the results.  Testing/Procedures: None  Follow-Up: At [facility], you and your health needs are our priority.  As part of our continuing mission to provide you with exceptional heart care, we have created designated Provider Care Teams.  These Care Teams include your primary Cardiologist (physician) and Advanced Practice Providers (APPs -  Physician Assistants and Nurse Practitioners) who all work together to provide you with the care you need, when you need it.  Your next appointment:   6 month(s)  The format for your next appointment:   In Person  Provider:   Brian Munley, MD   

## 2019-07-28 NOTE — Progress Notes (Signed)
Virtual Visit via Video Note   This visit type was conducted due to national recommendations for restrictions regarding the COVID-19 Pandemic (e.g. social distancing) in an effort to limit this patient's exposure and mitigate transmission in our community.  Due to her co-morbid illnesses, this patient is at least at moderate risk for complications without adequate follow up.  This format is felt to be most appropriate for this patient at this time.  All issues noted in this document were discussed and addressed.  A limited physical exam was performed with this format.  Please refer to the patient's chart for her consent to telehealth for [facility]. Date:  07/28/2019   ID:  Leslie Ward, DOB Jun 06, 1931, MRN [id number]  PCP:  [name], PA-C  Cardiologist:  Norman Herrlich, MD    Referring MD: [name], PA-C    ASSESSMENT:    1. Essential hypertension   2. Hyperlipidemia, unspecified hyperlipidemia type    PLAN:    In order of problems listed above:  1. Her hypertension is stable BP is controlled on multidrug regimen including diuretic ARB beta-blocker calcium channel blocker without side effects she is well supervised will continue being monitored by her son "contact me if your blood pressure is out of range and will need to check kidney function next visit. 2. Presently taking over-the-counter product she will need a lipid profile next visit this time she does not want nonstatins such as PCSK9 or Zetia.  16 minutes spent during the visit, her son assisted in helping with the examination positioning of the camera.   Next appointment: 6 months   Medication Adjustments/Labs and Tests Ordered: Current medicines are reviewed at length with the patient today.  Concerns regarding medicines are outlined above.  No orders of the defined types were placed in this encounter.  No orders of the defined types were placed in this encounter.  Chief complaint follow-up  for hypertension  History of Present Illness:    Leslie Ward is a 85 y.o. female with a hx of  hypertension hyperlipidemia and type 2 diabetes   last seen 04/21/2019.  Unfortunately she is statin intolerant and we deferred treatment with other lipid-lowering medications at her request. Compliance with diet, lifestyle and medications: Yes  Fortunately her son is present.  He looked at her home blood pressures and almost always less than 140 systolic.  She tolerates her antihypertensives she has no edema shortness of breath chest pain palpitation or syncope and has had no lightheadedness.  She has not taken over-the-counter red rice yeast formulation she tolerates it for hyperlipidemia and will need to do a lipid profile next visit.  She does not want to try another prescription statin or alternative like PCSK9 or Zetia.  She is improved after seeing a neurologist and having a injection for cervical disc disease.  Her tremor is diminished.  Her spirits are good she has gained a few pounds during COVID-19 she is not leaving the home and her son does the shopping for her Past Medical History:  Diagnosis Date  . Dystonia   . Hypertension   . Thyroid disease     Past Surgical History:  Procedure Laterality Date  . NO PAST SURGERIES      Current Medications: Current Meds  Medication Sig  . amLODipine (NORVASC) 5 MG tablet Take 1 tablet (5 mg total) by mouth 2 (two) times daily.  . Calcium Carbonate (CALCIUM 600 PO) Take 2 tablets by mouth daily.  [name]  Calcium Polycarbophil (FIBER-CAPS PO) Take 1 capsule by mouth 2 (two) times a day.   . Cinnamon 500 MG capsule Take 1,000 mg by mouth daily.  . hydrochlorothiazide (MICROZIDE) 12.5 MG capsule Take 1 capsule on Monday and Friday only.  [name]. levothyroxine (SYNTHROID) 50 MCG tablet Take 1 tablet (50 mcg total) by mouth daily. Dc 75 mcg dose.  [name]. MELATONIN PO Take 2-3 tablets by mouth at bedtime as needed.  . Menaquinone-7 (VITAMIN K2 PO) Take 1 tablet  by mouth daily.  . metoprolol tartrate (LOPRESSOR) 50 MG tablet Take 1 tablet (50 mg total) by mouth 2 (two) times daily.  . Multiple Vitamin (MULTIVITAMIN) tablet Take 1 tablet by mouth daily.  . NON FORMULARY Cholasta Care-3 tablet daily  . Omega-3 Fatty Acids (OMEGA 3 PO) Take 2 tablets by mouth daily.  . Probiotic Product (PROBIOTIC DAILY PO) Take 1 tablet by mouth daily.  [name]. telmisartan (MICARDIS) 20 MG tablet Take 1 tablet daily in the evening or at bedtime.  . vitamin C (ASCORBIC ACID) 500 MG tablet Take 500 mg by mouth daily.   Current Facility-Administered Medications for the 07/28/19 encounter (Telemedicine) with Baldo DaubMunley, Houda Brau J, MD  Medication  . incobotulinumtoxinA (XEOMIN) 100 units injection 100 Units     Allergies:   Crestor [rosuvastatin calcium]   Social History   Socioeconomic History  . Marital status: Widowed    Spouse name: Not on file  . Number of children: 1  . Years of education: 6 or 7 years of education  . Highest education level: Not on file  Occupational History  . Occupation: Retired  Tobacco Use  . Smoking status: Never Smoker  . Smokeless tobacco: Never Used  Substance and Sexual Activity  . Alcohol use: No  . Drug use: No  . Sexual activity: Not Currently  Other Topics Concern  . Not on file  Social History Narrative   Lives alone.   Right-handed.   No caffeine use.   Social Determinants of Health   Financial Resource Strain:   . Difficulty of Paying Living Expenses: Not on file  Food Insecurity:   . Worried About Programme researcher, broadcasting/film/videounning Out of Food in the Last Year: Not on file  . Ran Out of Food in the Last Year: Not on file  Transportation Needs:   . Lack of Transportation (Medical): Not on file  . Lack of Transportation (Non-Medical): Not on file  Physical Activity:   . Days of Exercise per Week: Not on file  . Minutes of Exercise per Session: Not on file  Stress:   . Feeling of Stress : Not on file  Social Connections:   . Frequency of  Communication with Friends and Family: Not on file  . Frequency of Social Gatherings with Friends and Family: Not on file  . Attends Religious Services: Not on file  . Active Member of Clubs or Organizations: Not on file  . Attends BankerClub or Organization Meetings: Not on file  . Marital Status: Not on file     Family History: The patient's family history includes Heart Problems in her brother; Heart attack in her father; Stroke in her mother. ROS:   Please see the history of present illness.    All other systems reviewed and are negative.  EKGs/Labs/Other Studies Reviewed:    The following studies were reviewed today:   Recent Labs: 02/11/2019: NT-Pro BNP 199 05/11/2019: ALT 11; BUN 24; Creatinine, Ser 0.98; Potassium 4.3; Sodium 142; TSH 0.425  Recent Lipid Panel  Component Value Date/Time   CHOL 279 (H) 05/11/2019 1007   TRIG 153 (H) 05/11/2019 1007   HDL 64 05/11/2019 1007   CHOLHDL 4.4 05/11/2019 1007   LDLCALC 187 (H) 05/11/2019 1007    Physical Exam:    VS:  BP (!) 122/56 (BP Location: Left Arm, Patient Position: Sitting)   Pulse 75   Ht 4\' 11"  (1.499 m)   Wt 126 lb (57.2 kg)   BMI 25.45 kg/m     Wt Readings from Last 3 Encounters:  07/28/19 126 lb (57.2 kg)  06/23/19 125 lb 8 oz (56.9 kg)  06/23/19 123 lb (55.8 kg)    Constitutional, well-nourished well-developed in no acute distress Vital signs reviewed Eyes, conjunctiva and sclera are normal without pallor or icterus extraocular motions intact and normal there is no lid lag Respiratory, normal effort and excursion no audible wheezing without a stethoscope Cardiovascular, no neck vein distention or peripheral edema Skin, no rash skin lesion or ulceration of the extremities Neurologic, cranial nerves II to XII are grossly intact and the patient moves all 4 extremities Neuro/Psychiatric, judgment and thought processes are intact and coherent, alert and oriented x3, mood and affect appear  normal.   Signed, Shirlee More, MD  07/28/2019 10:12 AM    [place]

## 2019-07-28 NOTE — Progress Notes (Signed)
I reinforced the 3W's for Covid prevention encourage his son to wear a mask and eye protection when he is out of the home particularly in stores with ventilation commercial systems and strongly encouraged her to accept vaccine when offered.

## 2019-08-04 ENCOUNTER — Telehealth (INDEPENDENT_AMBULATORY_CARE_PROVIDER_SITE_OTHER): Payer: Medicare Other | Admitting: Family Medicine

## 2019-08-04 ENCOUNTER — Other Ambulatory Visit: Payer: Self-pay

## 2019-08-04 DIAGNOSIS — M7502 Adhesive capsulitis of left shoulder: Secondary | ICD-10-CM

## 2019-08-04 NOTE — Progress Notes (Signed)
Virtual Visit via Video Note  I connected with Leslie Ward on 08/04/19 at 10:30 AM EST by a video enabled telemedicine application and verified that I am speaking with the correct person using two identifiers.   I discussed the limitations of evaluation and management by telemedicine and the availability of in person appointments. The patient expressed understanding and agreed to proceed.  History of Present Illness:  [name] is an 85 year old female that is following up for her left shoulder pain.  She received a Toradol injection in November and had some improvement of her symptoms.  She still experiences pain intermittently through the course of the day.  She feels that her range of motion has improved.  She is doing the home exercises.  She is concerned about going to physical therapy due to Covid.   Observations/Objective:  Gen: NAD, alert, cooperative with exam, well-appearing ENT: normal lips, normal nasal mucosa,  Eye: normal EOM, normal conjunctiva and lids Resp: no accessory muscle use, non-labored,  Psych:  normal insight, alert and oriented  Assessment and Plan:  Adhesive capsulitis of left shoulder: Mild improvement with Toradol injection.  Still reports pain with sleeping on the affected side and range of motion issues. -Counseled on home exercise therapy and supportive care. -Could consider subacromial injection. - Discussed home health therapy and formal physical therapy.  She would like to hold off for now.  Follow Up Instructions:    I discussed the assessment and treatment plan with the patient. The patient was provided an opportunity to ask questions and all were answered. The patient agreed with the plan and demonstrated an understanding of the instructions.   The patient was advised to call back or seek an in-person evaluation if the symptoms worsen or if the condition fails to improve as anticipated.   Clearance Coots, MD

## 2019-08-04 NOTE — Assessment & Plan Note (Signed)
Mild improvement with Toradol injection.  Still reports pain with sleeping on the affected side and range of motion issues. -Counseled on home exercise therapy and supportive care. -Could consider subacromial injection. - Discussed home health therapy and formal physical therapy.  She would like to hold off for now.

## 2019-08-12 ENCOUNTER — Other Ambulatory Visit: Payer: Self-pay | Admitting: Medical

## 2019-09-27 ENCOUNTER — Telehealth: Payer: Self-pay

## 2019-09-27 NOTE — Telephone Encounter (Signed)
I called the patient's insurance and spoke with Annice Pih who stated the patient is active and code was covered with [employer] [phone]. [place]WD

## 2019-09-29 ENCOUNTER — Other Ambulatory Visit: Payer: Self-pay | Admitting: Medical

## 2019-09-29 ENCOUNTER — Ambulatory Visit (INDEPENDENT_AMBULATORY_CARE_PROVIDER_SITE_OTHER): Payer: Medicare Other | Admitting: Neurology

## 2019-09-29 ENCOUNTER — Encounter: Payer: Self-pay | Admitting: Neurology

## 2019-09-29 ENCOUNTER — Other Ambulatory Visit: Payer: Self-pay

## 2019-09-29 ENCOUNTER — Telehealth: Payer: Self-pay | Admitting: *Deleted

## 2019-09-29 VITALS — BP 130/69 | HR 67 | Temp 97.8°F | Ht 59.0 in | Wt 124.5 lb

## 2019-09-29 DIAGNOSIS — G243 Spasmodic torticollis: Secondary | ICD-10-CM
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
## 2019-09-29 NOTE — Telephone Encounter (Signed)
Pt arrived to 2:30pm appt at 3pm. Her son stated she had the incorrect appt time down. She was still able to be seen.

## 2019-09-29 NOTE — Telephone Encounter (Signed)
No showed Xeomin appointment. 

## 2019-09-29 NOTE — Progress Notes (Signed)
**  Xeomin 100 units x 1 vial, NDC 0259-1610-01, Lot [id number], Exp 05/2021, office supply.//mck,rn** 

## 2019-10-14 ENCOUNTER — Ambulatory Visit: Payer: Medicare Other | Admitting: Medical

## 2019-10-14 ENCOUNTER — Other Ambulatory Visit: Payer: Self-pay

## 2019-10-15 ENCOUNTER — Ambulatory Visit (INDEPENDENT_AMBULATORY_CARE_PROVIDER_SITE_OTHER): Payer: Medicare Other | Admitting: Internal Medicine

## 2019-10-15 ENCOUNTER — Other Ambulatory Visit: Payer: Self-pay

## 2019-10-15 ENCOUNTER — Encounter: Payer: Self-pay | Admitting: Internal Medicine

## 2019-10-15 VITALS — BP 140/55 | HR 79 | Temp 95.4°F | Resp 16 | Ht 59.0 in | Wt 122.0 lb

## 2019-10-15 DIAGNOSIS — M542 Cervicalgia: Secondary | ICD-10-CM | POA: Diagnosis not present

## 2019-10-15 MED ORDER — CYCLOBENZAPRINE HCL 5 MG PO TABS: 5.0000 mg | ORAL_TABLET | Freq: Every evening | ORAL | 0 refills | Status: DC | PRN

## 2019-10-15 MED ORDER — PREDNISONE 10 MG PO TABS: ORAL_TABLET | ORAL | 0 refills | Status: DC

## 2019-10-15 NOTE — Progress Notes (Signed)
Pre visit review using our clinic review tool, if applicable. No additional management support is needed unless otherwise documented below in the visit note. 

## 2019-10-15 NOTE — Patient Instructions (Signed)
Tylenol  500 mg OTC 2 tabs a day every 8 hours as needed for pain  Prednisone as prescribed, it is a tapered dose. It might increase your blood sugar, if you have a chance to check be sure is no more than 180.   Flexeril, a muscle relaxant, only at night, watch for excessive sedation  Heating pad  Call if not gradually better in the next 2 weeks  Call if symptoms severe.

## 2019-10-15 NOTE — Progress Notes (Signed)
Subjective:    Patient ID: Leslie Ward, female    DOB: January 29, 1931, 86 y.o.   MRN: [id number]  DOS:  10/15/2019 Type of visit - description: Acute, here with his son. 2 weeks ago developed pain at the posterior area of the neck. No radiation Decreased when she lays down, and when she flexes her neck.  Increased with neck extension.  She denies any headache per se, no recent falls or injuries.  Review of Systems No fever chills, no weight loss. No dizziness, diplopia, slurred speech or motor deficits.  Past Medical History:  Diagnosis Date  . Dystonia   . Hypertension   . Thyroid disease     Past Surgical History:  Procedure Laterality Date  . NO PAST SURGERIES      Allergies as of 10/15/2019      Reactions   Crestor [rosuvastatin Calcium] Other (See Comments)   "caused me to go into kidney failure"      Medication List       Accurate as of October 15, 2019 11:59 PM. If you have any questions, ask your nurse or doctor.        amLODipine 5 MG tablet Commonly known as: NORVASC Take 1 tablet (5 mg total) by mouth 2 (two) times daily.   CALCIUM 600 PO Take 2 tablets by mouth daily.   Cinnamon 500 MG capsule Take 1,000 mg by mouth daily.   cyclobenzaprine 5 MG tablet Commonly known as: FLEXERIL Take 1 tablet (5 mg total) by mouth at bedtime as needed for muscle spasms. Started by: Kathlene November, MD   FIBER-CAPS PO Take 1 capsule by mouth 2 (two) times a day.   hydrochlorothiazide 12.5 MG capsule Commonly known as: MICROZIDE Take 1 capsule on Monday and Friday only.   levothyroxine 50 MCG tablet Commonly known as: SYNTHROID TAKE 1 TABLET BY MOUTH DAILY   MELATONIN PO Take 2-3 tablets by mouth at bedtime as needed.   metoprolol tartrate 50 MG tablet Commonly known as: LOPRESSOR TAKE 1 TABLET(50 MG) BY MOUTH TWICE DAILY   multivitamin tablet Take 1 tablet by mouth daily.   NON FORMULARY Cholasta Care-3 tablet daily   OMEGA 3 PO Take 2 tablets by mouth  daily.   predniSONE 10 MG tablet Commonly known as: DELTASONE 3 tabs x 3 days, 2 tabs x 3 days, 1 tab x 3 days Started by: Kathlene November, MD   PROBIOTIC DAILY PO Take 1 tablet by mouth daily.   telmisartan 20 MG tablet Commonly known as: MICARDIS Take 1 tablet daily in the evening or at bedtime.   vitamin C 500 MG tablet Commonly known as: ASCORBIC ACID Take 500 mg by mouth daily.   VITAMIN K2 PO Take 1 tablet by mouth daily.          Objective:   Physical Exam Neck:     BP (!) 140/55 (BP Location: Left Arm, Patient Position: Sitting, Cuff Size: Small)   Pulse 79   Temp (!) 95.4 F (35.2 C) (Temporal)   Resp 16   Ht 4\' 11"  (1.499 m)   Wt 122 lb (55.3 kg)   SpO2 98%   BMI 24.64 kg/m  General:   Well developed, NAD, BMI noted. HEENT:  Normocephalic . Face symmetric, atraumatic Lower extremities: no pretibial edema bilaterally  MSK: Range of motion slightly decreased throughout.  Some pain with hyper extension of the neck. No TTP at the cervical spine, but is slightly TTP at the posterior nuchal area, see  graphic. Skin: Not pale. Not jaundice Neurologic:  alert & oriented X3.  Speech normal, gait appropriate for age and unassisted + Neck tremor.  Otherwise motor symmetric. Psych--  Cognition and judgment appear intact.  Cooperative with normal attention span and concentration.  Behavior appropriate. No anxious or depressed appearing.      Assessment    86 year old female, PMH includes HTN, Hypothyroidism, DM, high cholesterol, chronic back pain.  Neck pain: As described above, likely MSK issue.  She has no headache, fever chills or weight loss. She carries a diagnosis of cervical dystonia, sees neurology. She has chronic neck pain radiating to both shoulders. Had a Botox injection 09/29/2019 Plan: Treat as a MSK problem with a round of prednisone (last A1c 6.4), Flexeril (watch for drowsiness), Tylenol, heating pad, call if not better.  See  instructions.  This visit occurred during the SARS-CoV-2 public health emergency.  Safety protocols were in place, including screening questions prior to the visit, additional usage of staff PPE, and extensive cleaning of exam room while observing appropriate contact time as indicated for disinfecting solutions.

## 2019-11-04 ENCOUNTER — Encounter: Payer: Self-pay | Admitting: Medical

## 2019-11-08 ENCOUNTER — Telehealth: Payer: Self-pay | Admitting: Medical

## 2019-11-08 NOTE — Telephone Encounter (Signed)
Patient is requesting a call back.

## 2019-11-08 NOTE — Telephone Encounter (Signed)
Called pt and lvm

## 2019-11-11 NOTE — Telephone Encounter (Signed)
[address] [place] called back stating he never got a Clinical cytogeneticist message back from [place]. I informed him Provider was out all week and that may be why. But a telephone note was sent in and [facility] Client called him back and left a message. He did confirm to getting the message. But was still upset that he didn't get a mychart message back. I apologized and let him know that I can have hannah call him back on Monday. He understood.

## 2019-11-15 NOTE — Telephone Encounter (Signed)
Called patient's son and he stated that he will just discuss the OTC medication questions at [facility] on 11/18/19

## 2019-11-16 ENCOUNTER — Ambulatory Visit: Payer: Medicare Other | Admitting: Medical

## 2019-11-17 ENCOUNTER — Other Ambulatory Visit: Payer: Self-pay

## 2019-11-18 ENCOUNTER — Other Ambulatory Visit: Payer: Self-pay

## 2019-11-18 ENCOUNTER — Ambulatory Visit (INDEPENDENT_AMBULATORY_CARE_PROVIDER_SITE_OTHER): Payer: Medicare Other | Admitting: Medical

## 2019-11-18 ENCOUNTER — Encounter: Payer: Self-pay | Admitting: Medical

## 2019-11-18 ENCOUNTER — Other Ambulatory Visit: Payer: Self-pay | Admitting: Medical

## 2019-11-18 VITALS — BP 158/69 | HR 83 | Ht 59.0 in | Wt 120.6 lb

## 2019-11-18 DIAGNOSIS — J301 Allergic rhinitis due to pollen: Secondary | ICD-10-CM | POA: Diagnosis not present

## 2019-11-18 MED ORDER — FLUTICASONE PROPIONATE 50 MCG/ACT NA SUSP: 2.0000 | Freq: Every day | NASAL | 1 refills | Status: DC

## 2019-11-18 MED ORDER — LEVOCETIRIZINE DIHYDROCHLORIDE 5 MG PO TABS: 5.0000 mg | ORAL_TABLET | Freq: Every evening | ORAL | 3 refills | Status: DC

## 2019-11-18 NOTE — Patient Instructions (Signed)
You do have signs/symptoms that probably represent allergic rhinitis. Will rx flonase nasal spray and xyzal.   Please give me update if symptoms persist or worsen. If so then add antibiotic.  If fever, chills, bodyaches, dyspnea, loss of smell then recommend get covid test. Currently not indicating test but did discuss to watch closely.  Follow up 7-10 days or as needed

## 2019-11-18 NOTE — Progress Notes (Signed)
Subjective:    Patient ID: Leslie Ward, female    DOB: 27-Aug-1930, 86 y.o.   MRN: [id number]  HPI   Pt in for recent possible allergy symptoms. Pt has blocked sensation to left nostril, nasal congestion and sinus pressure for 2-3 weeks. Some sneezing at times. Rare occasional cough. No fever, no chills or sweats. Can smell and taste. No diffuse myalgias.  Pt been in [place] for 3.5 years. Some years allergies in spring.     Review of Systems  Constitutional: Negative for chills and fever.  HENT: Positive for congestion, postnasal drip, sinus pressure and sneezing. Negative for sinus pain and sore throat.   Respiratory: Negative for cough, chest tightness, shortness of breath and wheezing.        Rare occasional cough but not significant.  Cardiovascular: Negative for chest pain and palpitations.  Gastrointestinal: Negative for abdominal pain.  Musculoskeletal: Negative for back pain and myalgias.  Skin: Negative for rash.  Neurological: Negative for dizziness and headaches.  Hematological: Negative for adenopathy. Does not bruise/bleed easily.  Psychiatric/Behavioral: Negative for behavioral problems.    Past Medical History:  Diagnosis Date  . Dystonia   . Hypertension   . Thyroid disease      Social History   Socioeconomic History  . Marital status: Widowed    Spouse name: Not on file  . Number of children: 1  . Years of education: 6 or 7 years of education  . Highest education level: Not on file  Occupational History  . Occupation: Retired  Tobacco Use  . Smoking status: Never Smoker  . Smokeless tobacco: Never Used  Substance and Sexual Activity  . Alcohol use: No  . Drug use: No  . Sexual activity: Not Currently  Other Topics Concern  . Not on file  Social History Narrative   Lives alone.   Right-handed.   No caffeine use.   Social Determinants of Health   Financial Resource Strain:   . Difficulty of Paying Living Expenses:   Food Insecurity:   .  Worried About Charity fundraiser in the Last Year:   . Arboriculturist in the Last Year:   Transportation Needs:   . Film/video editor (Medical):   [name] Lack of Transportation (Non-Medical):   Physical Activity:   . Days of Exercise per Week:   . Minutes of Exercise per Session:   Stress:   . Feeling of Stress :   Social Connections:   . Frequency of Communication with Friends and Family:   . Frequency of Social Gatherings with Friends and Family:   . Attends Religious Services:   . Active Member of Clubs or Organizations:   . Attends Archivist Meetings:   [name] Marital Status:   Intimate Partner Violence:   . Fear of Current or Ex-Partner:   . Emotionally Abused:   [name] Physically Abused:   . Sexually Abused:     Past Surgical History:  Procedure Laterality Date  . NO PAST SURGERIES      Family History  Problem Relation Age of Onset  . Stroke Mother   . Heart attack Father   . Heart Problems Brother     Allergies  Allergen Reactions  . Crestor [Rosuvastatin Calcium] Other (See Comments)    "caused me to go into kidney failure"    Current Outpatient Medications on File Prior to Visit  Medication Sig Dispense Refill  . amLODipine (NORVASC) 5 MG tablet Take 1 tablet (5  mg total) by mouth 2 (two) times daily. 180 tablet 1  . Calcium Carbonate (CALCIUM 600 PO) Take 2 tablets by mouth daily.    . Calcium Polycarbophil (FIBER-CAPS PO) Take 1 capsule by mouth 2 (two) times a day.     . Cinnamon 500 MG capsule Take 1,000 mg by mouth daily.    . cyclobenzaprine (FLEXERIL) 5 MG tablet Take 1 tablet (5 mg total) by mouth at bedtime as needed for muscle spasms. (Patient not taking: Reported on 11/18/2019) 21 tablet 0  . hydrochlorothiazide (MICROZIDE) 12.5 MG capsule Take 1 capsule on Monday and Friday only.    [name] levothyroxine (SYNTHROID) 50 MCG tablet TAKE 1 TABLET BY MOUTH DAILY 90 tablet 0  . MELATONIN PO Take 2-3 tablets by mouth at bedtime as needed.    .  Menaquinone-7 (VITAMIN K2 PO) Take 1 tablet by mouth daily.    . metoprolol tartrate (LOPRESSOR) 50 MG tablet TAKE 1 TABLET(50 MG) BY MOUTH TWICE DAILY 180 tablet 1  . Multiple Vitamin (MULTIVITAMIN) tablet Take 1 tablet by mouth daily.    . NON FORMULARY Cholasta Care-3 tablet daily    . Omega-3 Fatty Acids (OMEGA 3 PO) Take 2 tablets by mouth daily.    . predniSONE (DELTASONE) 10 MG tablet 3 tabs x 3 days, 2 tabs x 3 days, 1 tab x 3 days (Patient not taking: Reported on 11/18/2019) 18 tablet 0  . Probiotic Product (PROBIOTIC DAILY PO) Take 1 tablet by mouth daily.    [name] telmisartan (MICARDIS) 20 MG tablet Take 1 tablet daily in the evening or at bedtime. 90 tablet 0  . vitamin C (ASCORBIC ACID) 500 MG tablet Take 500 mg by mouth daily.     No current facility-administered medications on file prior to visit.    BP (!) 158/69   Pulse 83   Ht 4\' 11"  (1.499 m)   Wt 120 lb 9.6 oz (54.7 kg)   BMI 24.36 kg/m       Objective:   Physical Exam   General Mental Status- Alert. General Appearance- Not in acute distress.   Skin General: Color- Normal Color. Moisture- Normal Moisture.  Neck Carotid Arteries- Normal color. Moisture- Normal Moisture. No carotid bruits. No JVD.  Chest and Lung Exam Auscultation: Breath Sounds:-Normal.  Cardiovascular Auscultation:Rythm- Regular. Murmurs & Other Heart Sounds:Auscultation of the heart reveals- No Murmurs.  Abdomen Inspection:-Inspeection Normal. Palpation/Percussion:Note:No mass. Palpation and Percussion of the abdomen reveal- Non Tender, Non Distended + BS, no rebound or guarding.   Neurologic Cranial Nerve exam:- CN III-XII intact(No nystagmus), symmetric smile. Strength:- 5/5 equal and symmetric strength both upper and lower extremities.     Assessment & Plan:  You do have signs/symptoms that probably represent allergic rhinitis. Will rx flonase nasal spray and xyzal.   Please give me update if symptoms persist or worsen. If so  then add antibiotic.  If fever, chills, bodyaches, dyspnea, loss of smell then recommend get covid test. Currently not indicating test but did discuss to watch closely.  Follow up 7-10 days or as needed  Time spent with patient today was 25  minutes which consisting of discussing diagnosis,  treatment and documentation.  , PA-C

## 2019-11-30 ENCOUNTER — Observation Stay (HOSPITAL_BASED_OUTPATIENT_CLINIC_OR_DEPARTMENT_OTHER)
Admission: EM | Admit: 2019-11-30 | Discharge: 2019-12-01 | Disposition: A | Discharge status: Home / Self Care | Payer: Medicare Other | Attending: Internal Medicine | Admitting: Internal Medicine

## 2019-11-30 ENCOUNTER — Emergency Department (HOSPITAL_BASED_OUTPATIENT_CLINIC_OR_DEPARTMENT_OTHER): Payer: Medicare Other

## 2019-11-30 ENCOUNTER — Other Ambulatory Visit: Payer: Self-pay

## 2019-11-30 ENCOUNTER — Encounter (HOSPITAL_BASED_OUTPATIENT_CLINIC_OR_DEPARTMENT_OTHER): Payer: Self-pay

## 2019-11-30 ENCOUNTER — Telehealth: Payer: Self-pay

## 2019-11-30 ENCOUNTER — Ambulatory Visit: Payer: Medicare Other | Admitting: Medical

## 2019-11-30 DIAGNOSIS — E1165 Type 2 diabetes mellitus with hyperglycemia: Secondary | ICD-10-CM | POA: Insufficient documentation

## 2019-11-30 DIAGNOSIS — Z794 Long term (current) use of insulin: Secondary | ICD-10-CM | POA: Insufficient documentation

## 2019-11-30 DIAGNOSIS — M542 Cervicalgia: Secondary | ICD-10-CM | POA: Insufficient documentation

## 2019-11-30 DIAGNOSIS — I639 Cerebral infarction, unspecified: Secondary | ICD-10-CM | POA: Diagnosis not present

## 2019-11-30 DIAGNOSIS — M7502 Adhesive capsulitis of left shoulder: Secondary | ICD-10-CM | POA: Diagnosis not present

## 2019-11-30 DIAGNOSIS — Z7982 Long term (current) use of aspirin: Secondary | ICD-10-CM | POA: Insufficient documentation

## 2019-11-30 DIAGNOSIS — Z823 Family history of stroke: Secondary | ICD-10-CM | POA: Diagnosis not present

## 2019-11-30 DIAGNOSIS — E119 Type 2 diabetes mellitus without complications: Secondary | ICD-10-CM

## 2019-11-30 DIAGNOSIS — E785 Hyperlipidemia, unspecified: Secondary | ICD-10-CM | POA: Diagnosis not present

## 2019-11-30 DIAGNOSIS — E041 Nontoxic single thyroid nodule: Secondary | ICD-10-CM | POA: Diagnosis not present

## 2019-11-30 DIAGNOSIS — E1151 Type 2 diabetes mellitus with diabetic peripheral angiopathy without gangrene: Secondary | ICD-10-CM | POA: Diagnosis not present

## 2019-11-30 DIAGNOSIS — W19XXXA Unspecified fall, initial encounter: Secondary | ICD-10-CM | POA: Diagnosis not present

## 2019-11-30 DIAGNOSIS — Z8249 Family history of ischemic heart disease and other diseases of the circulatory system: Secondary | ICD-10-CM | POA: Diagnosis not present

## 2019-11-30 DIAGNOSIS — R42 Dizziness and giddiness: Secondary | ICD-10-CM

## 2019-11-30 DIAGNOSIS — Z79899 Other long term (current) drug therapy: Secondary | ICD-10-CM | POA: Insufficient documentation

## 2019-11-30 DIAGNOSIS — S0990XA Unspecified injury of head, initial encounter: Secondary | ICD-10-CM | POA: Diagnosis not present

## 2019-11-30 DIAGNOSIS — Z9181 History of falling: Secondary | ICD-10-CM | POA: Diagnosis not present

## 2019-11-30 DIAGNOSIS — I358 Other nonrheumatic aortic valve disorders: Secondary | ICD-10-CM | POA: Insufficient documentation

## 2019-11-30 DIAGNOSIS — H8110 Benign paroxysmal vertigo, unspecified ear: Secondary | ICD-10-CM | POA: Insufficient documentation

## 2019-11-30 DIAGNOSIS — Z20822 Contact with and (suspected) exposure to covid-19: Secondary | ICD-10-CM | POA: Diagnosis not present

## 2019-11-30 DIAGNOSIS — I6523 Occlusion and stenosis of bilateral carotid arteries: Secondary | ICD-10-CM | POA: Insufficient documentation

## 2019-11-30 DIAGNOSIS — I10 Essential (primary) hypertension: Secondary | ICD-10-CM | POA: Diagnosis present

## 2019-11-30 DIAGNOSIS — Z7952 Long term (current) use of systemic steroids: Secondary | ICD-10-CM | POA: Insufficient documentation

## 2019-11-30 DIAGNOSIS — G249 Dystonia, unspecified: Secondary | ICD-10-CM | POA: Insufficient documentation

## 2019-11-30 DIAGNOSIS — E039 Hypothyroidism, unspecified: Secondary | ICD-10-CM | POA: Diagnosis not present

## 2019-11-30 DIAGNOSIS — Z7901 Long term (current) use of anticoagulants: Secondary | ICD-10-CM | POA: Diagnosis not present

## 2019-11-30 DIAGNOSIS — Z03818 Encounter for observation for suspected exposure to other biological agents ruled out: Secondary | ICD-10-CM | POA: Diagnosis not present

## 2019-11-30 LAB — BASIC METABOLIC PANEL
Anion gap: 11 (ref 5–15)
BUN: 24 mg/dL — ABNORMAL HIGH (ref 8–23)
CO2: 27 mmol/L (ref 22–32)
Calcium: 9.7 mg/dL (ref 8.9–10.3)
Chloride: 103 mmol/L (ref 98–111)
Creatinine, Ser: 1.09 mg/dL — ABNORMAL HIGH (ref 0.44–1.00)
GFR calc Af Amer: 53 mL/min — ABNORMAL LOW (ref >60)
GFR calc non Af Amer: 46 mL/min — ABNORMAL LOW (ref >60)
Glucose, Bld: 149 mg/dL — ABNORMAL HIGH (ref 70–99)
Potassium: 3.6 mmol/L (ref 3.5–5.1)
Sodium: 141 mmol/L (ref 135–145)

## 2019-11-30 LAB — CBC WITH DIFFERENTIAL/PLATELET
Abs Immature Granulocytes: 0.03 10*3/uL (ref 0.00–0.07)
Basophils Absolute: 0 10*3/uL (ref 0.0–0.1)
Basophils Relative: 0 %
Eosinophils Absolute: 0.1 10*3/uL (ref 0.0–0.5)
Eosinophils Relative: 1 %
HCT: 44 % (ref 36.0–46.0)
Hemoglobin: 14.4 g/dL (ref 12.0–15.0)
Immature Granulocytes: 0 %
Lymphocytes Relative: 33 %
Lymphs Abs: 2.7 10*3/uL (ref 0.7–4.0)
MCH: 30.5 pg (ref 26.0–34.0)
MCHC: 32.7 g/dL (ref 30.0–36.0)
MCV: 93.2 fL (ref 80.0–100.0)
Monocytes Absolute: 0.4 10*3/uL (ref 0.1–1.0)
Monocytes Relative: 5 %
Neutro Abs: 4.9 10*3/uL (ref 1.7–7.7)
Neutrophils Relative %: 61 %
Platelets: 293 10*3/uL (ref 150–400)
RBC: 4.72 MIL/uL (ref 3.87–5.11)
RDW: 13.1 % (ref 11.5–15.5)
WBC: 8.2 10*3/uL (ref 4.0–10.5)
nRBC: 0 % (ref 0.0–0.2)

## 2019-11-30 IMAGING — CT CT ANGIO HEAD
1 of 11 series · 5 of 33 positions shown · IV contrast (omnipaque)
Comparison: CT cervical spine [DATE], thyroid ultrasound
[DATE]

CLINICAL DATA: Ataxia, stroke suspected. Additional history
provided: Several days of dizziness, fall this morning hitting right
side of head.



[Series 11: axial thin · axial · 0.38mm/px · z∈[-282,-90]mm · 5 of 290 slices shown]
[im 49/290  soft-tissue]
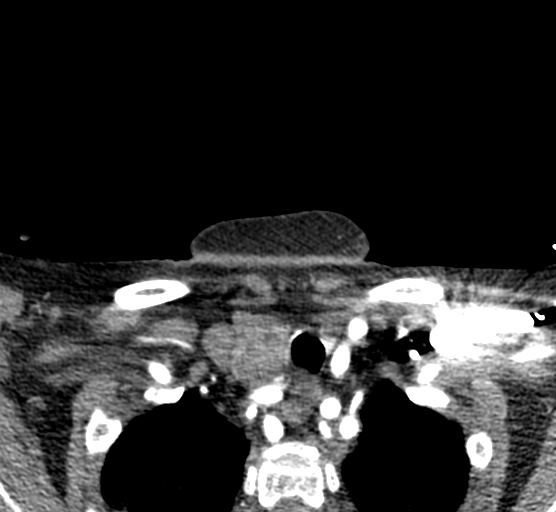
[im 97/290  bone]
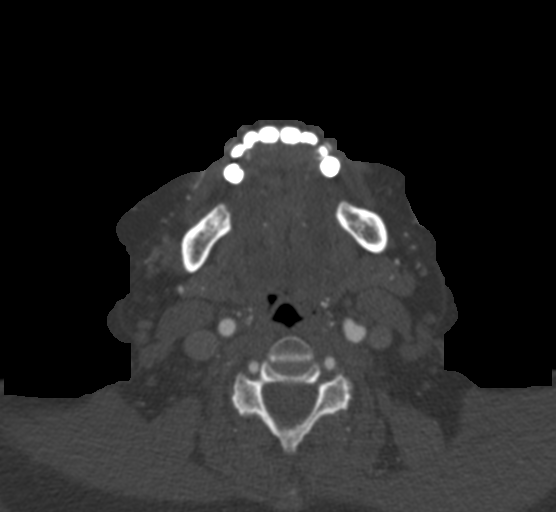
[im 145/290  soft-tissue]
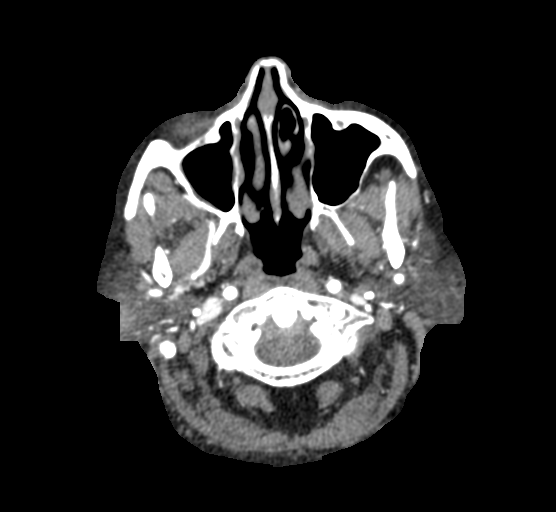
[im 193/290  bone]
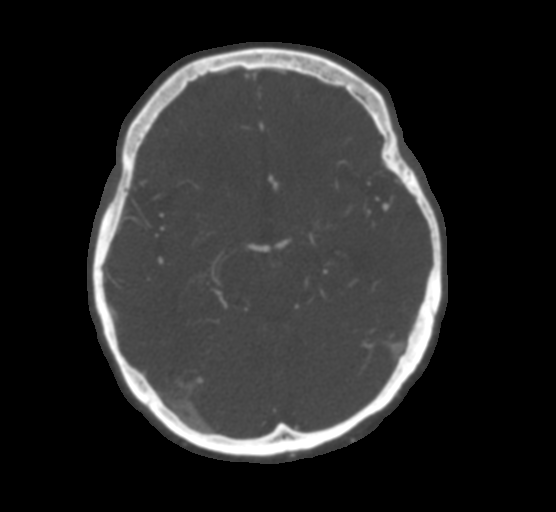
[im 241/290  soft-tissue]
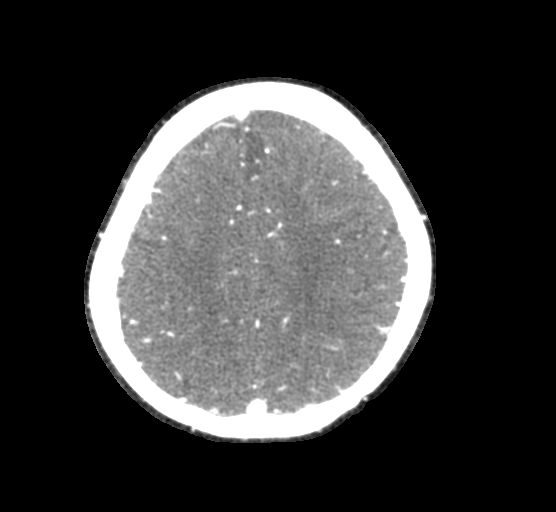

[5 of 33 positions shown; findings below may reference images not displayed]

FINDINGS: CT HEAD FINDINGS

Brain: There is no evidence of acute intracranial hemorrhage,
intracranial mass, midline shift or extra-axial fluid collection.No
demarcated cortical infarction. There are small age-indeterminate
lacunar infarcts within the right basal ganglia and right thalamus.
Chronic appearing lacunar infarct versus prominent perivascular
space within inferior left basal ganglia). Background moderate
patchy hypodensity within the cerebral white matter is nonspecific,
but consistent chronic small vessel ischemic disease. Moderate
generalized parenchymal atrophy.

Vascular: No hyperdense vessel.  Atherosclerotic calcifications.

Skull: Normal. Negative for fracture or focal lesion.

Sinuses: Mild mucosal thickening within the bilateral sphenoid and
right maxillary sinuses. Small left mastoid effusion

Orbits: No acute abnormality.

Review of the MIP images confirms the above findings

CTA NECK FINDINGS

Aortic arch: Incidentally noted aberrant right subclavian artery.
Common origin of the innominate and left common carotid arteries.
Atherosclerotic plaque within the visualized aortic arch and
proximal major branch vessels of the neck. No hemodynamically
significant stenosis of the innominate or proximal subclavian
arteries.

Right carotid system: CCA and ICA patent within the neck without
measurable stenosis. No significant atherosclerotic disease.

Left carotid system: CCA and ICA patent within the neck without
measurable stenosis. Mild calcified plaque within the distal
cervical ICA.

Vertebral arteries: The vertebral arteries are codominant and patent
within the neck bilaterally. Soft plaque results in mild to moderate
narrowing at the origin of the left vertebral artery.

Skeleton: No acute bony abnormality or aggressive osseous lesion.

Other neck: 2.4 cm right thyroid lobe nodule. This nodule was
previously assessed by thyroid ultrasound [DATE]. Please refer
to this prior report for further description.

Upper chest: No consolidation within the imaged lung apices.

Review of the MIP images confirms the above findings

CTA HEAD FINDINGS

Anterior circulation:

The intracranial internal carotid arteries are patent. Mild
calcified plaque within the cavernous segments without significant
stenosis.

The M1 middle cerebral arteries are patent without significant
stenosis. No M2 proximal branch occlusion or high-grade proximal
stenosis is identified.

The anterior cerebral arteries are patent without high-grade
proximal stenosis.

No intracranial aneurysm is identified.

Posterior circulation:

The intracranial vertebral arteries are patent without significant
stenosis, as is the basilar artery. The posterior cerebral arteries
are patent proximally without significant stenosis. Posterior
communicating arteries are poorly delineated and may be hypoplastic
or absent bilaterally.

Venous sinuses: Within limitations of contrast timing, no convincing
thrombus.

Anatomic variants: As described

Review of the MIP images confirms the above findings
IMPRESSION: CT head:

1. Small age-indeterminate lacunar infarcts within the right basal
ganglia and right thalamus.
2. Prominent perivascular space versus chronic lacunar infarct
within the inferior left basal ganglia.
3. Background moderate generalized parenchymal atrophy and chronic
small vessel ischemic disease.
4. Mild paranasal sinus mucosal thickening.
5. Small left mastoid effusion.

CTA neck:

1. The bilateral common carotid and internal carotid arteries are
patent within the neck without significant stenosis. Mild calcified
plaque within the distal cervical left ICA.
2. The vertebral arteries are codominant and patent within the neck.
Soft plaque results in mild to moderate narrowing at the origin of
the left vertebral artery.
3. Incidentally noted aberrant right subclavian artery.

CTA head:

1. No intracranial large vessel occlusion or proximal high-grade
arterial stenosis.
2. Mild calcified plaque within the bilateral ICA siphons.

## 2019-11-30 IMAGING — CT CT ANGIO NECK
1 of 11 series · 5 of 33 positions shown · IV contrast (omnipaque)
Comparison: CT cervical spine [DATE], thyroid ultrasound
[DATE]

CLINICAL DATA: Ataxia, stroke suspected. Additional history
provided: Several days of dizziness, fall this morning hitting right
side of head.



[Series 11: axial thin · axial · 0.38mm/px · z∈[-282,-90]mm · 5 of 290 slices shown]
[im 49/290  soft-tissue]
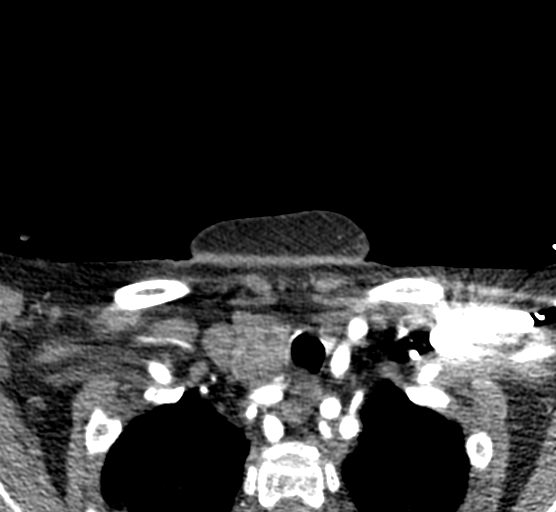
[im 97/290  bone]
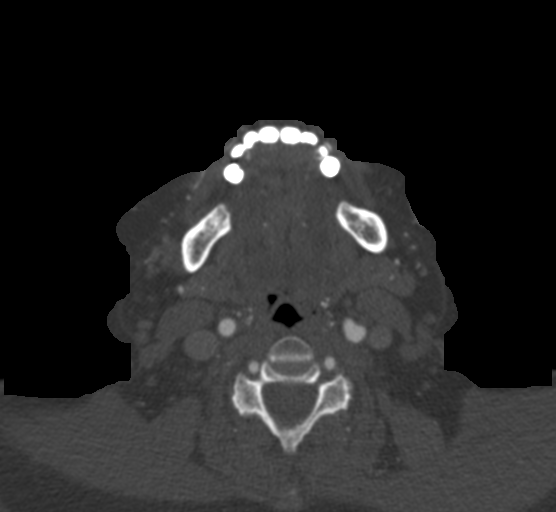
[im 145/290  soft-tissue]
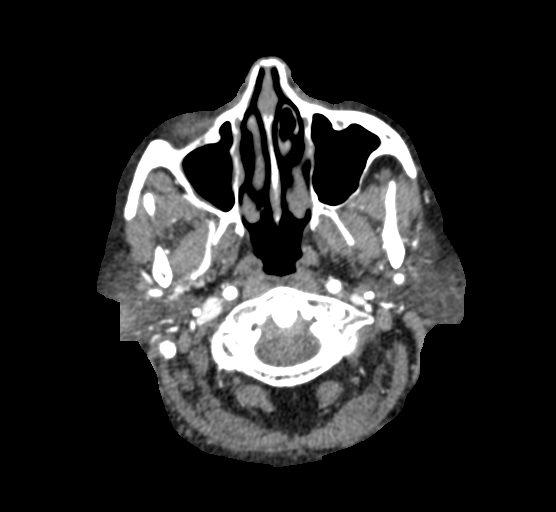
[im 193/290  bone]
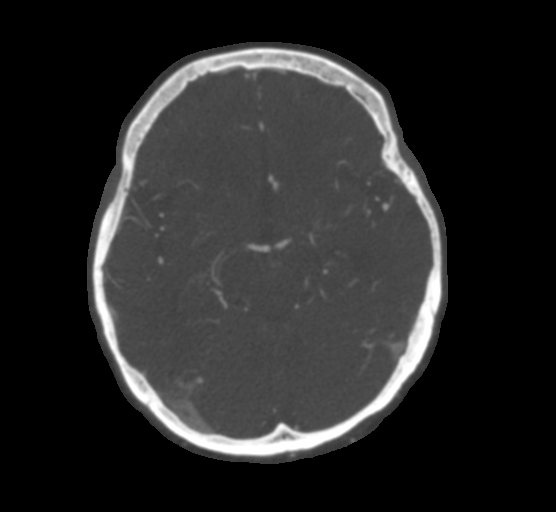
[im 241/290  soft-tissue]
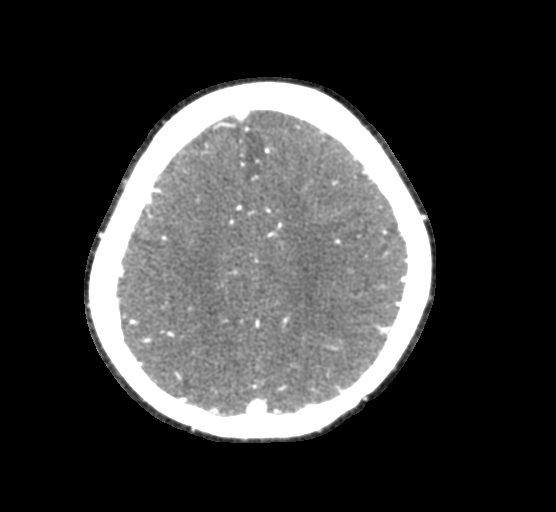

[5 of 33 positions shown; findings below may reference images not displayed]

FINDINGS: CT HEAD FINDINGS

Brain: There is no evidence of acute intracranial hemorrhage,
intracranial mass, midline shift or extra-axial fluid collection.No
demarcated cortical infarction. There are small age-indeterminate
lacunar infarcts within the right basal ganglia and right thalamus.
Chronic appearing lacunar infarct versus prominent perivascular
space within inferior left basal ganglia). Background moderate
patchy hypodensity within the cerebral white matter is nonspecific,
but consistent chronic small vessel ischemic disease. Moderate
generalized parenchymal atrophy.

Vascular: No hyperdense vessel.  Atherosclerotic calcifications.

Skull: Normal. Negative for fracture or focal lesion.

Sinuses: Mild mucosal thickening within the bilateral sphenoid and
right maxillary sinuses. Small left mastoid effusion

Orbits: No acute abnormality.

Review of the MIP images confirms the above findings

CTA NECK FINDINGS

Aortic arch: Incidentally noted aberrant right subclavian artery.
Common origin of the innominate and left common carotid arteries.
Atherosclerotic plaque within the visualized aortic arch and
proximal major branch vessels of the neck. No hemodynamically
significant stenosis of the innominate or proximal subclavian
arteries.

Right carotid system: CCA and ICA patent within the neck without
measurable stenosis. No significant atherosclerotic disease.

Left carotid system: CCA and ICA patent within the neck without
measurable stenosis. Mild calcified plaque within the distal
cervical ICA.

Vertebral arteries: The vertebral arteries are codominant and patent
within the neck bilaterally. Soft plaque results in mild to moderate
narrowing at the origin of the left vertebral artery.

Skeleton: No acute bony abnormality or aggressive osseous lesion.

Other neck: 2.4 cm right thyroid lobe nodule. This nodule was
previously assessed by thyroid ultrasound [DATE]. Please refer
to this prior report for further description.

Upper chest: No consolidation within the imaged lung apices.

Review of the MIP images confirms the above findings

CTA HEAD FINDINGS

Anterior circulation:

The intracranial internal carotid arteries are patent. Mild
calcified plaque within the cavernous segments without significant
stenosis.

The M1 middle cerebral arteries are patent without significant
stenosis. No M2 proximal branch occlusion or high-grade proximal
stenosis is identified.

The anterior cerebral arteries are patent without high-grade
proximal stenosis.

No intracranial aneurysm is identified.

Posterior circulation:

The intracranial vertebral arteries are patent without significant
stenosis, as is the basilar artery. The posterior cerebral arteries
are patent proximally without significant stenosis. Posterior
communicating arteries are poorly delineated and may be hypoplastic
or absent bilaterally.

Venous sinuses: Within limitations of contrast timing, no convincing
thrombus.

Anatomic variants: As described

Review of the MIP images confirms the above findings
IMPRESSION: CT head:

1. Small age-indeterminate lacunar infarcts within the right basal
ganglia and right thalamus.
2. Prominent perivascular space versus chronic lacunar infarct
within the inferior left basal ganglia.
3. Background moderate generalized parenchymal atrophy and chronic
small vessel ischemic disease.
4. Mild paranasal sinus mucosal thickening.
5. Small left mastoid effusion.

CTA neck:

1. The bilateral common carotid and internal carotid arteries are
patent within the neck without significant stenosis. Mild calcified
plaque within the distal cervical left ICA.
2. The vertebral arteries are codominant and patent within the neck.
Soft plaque results in mild to moderate narrowing at the origin of
the left vertebral artery.
3. Incidentally noted aberrant right subclavian artery.

CTA head:

1. No intracranial large vessel occlusion or proximal high-grade
arterial stenosis.
2. Mild calcified plaque within the bilateral ICA siphons.

## 2019-11-30 MED ORDER — IOHEXOL 350 MG/ML SOLN
100.0000 mL | Freq: Once | INTRAVENOUS | Status: AC | PRN
  Administered 2019-11-30: 14:00:00 80 mL via INTRAVENOUS

## 2019-11-30 MED ORDER — SODIUM CHLORIDE 0.9 % IV BOLUS
1000.0000 mL | Freq: Once | INTRAVENOUS | Status: AC
  Administered 2019-11-30: 1000 mL via INTRAVENOUS

## 2019-11-30 NOTE — ED Notes (Signed)
Report given to: [place], Charity fundraiser at [facility].

## 2019-11-30 NOTE — Consult Note (Signed)
[facility] Tele[facility]urology Consult Services  Stat Consult  Date of Service:   11/30/2019 15:08:16  Impression:     .  I63.9 - Cerebrovascular accident (CVA), unspecified mechanism ([place])  Comments/Sign-Out: Pt is a 86 YOF presents with unsteady gait, dizziness (room spinning sensation) and fall with head trauma, not on any AC and no focal neuro exam findings. She cannot have MRI due to bladder stim. CTA head and neck: b/l lacunar infarcts age indeterminate. Admit for TIA workup ( got to wait for central vs peripheral causes vs cardiac).   CT: 1. Small age-indeterminate lacunar infarcts within the right basal ganglia and right thalamus. 2. Prominent perivascular space versus chronic lacunar infarct within the inferior left basal ganglia. 3. Background moderate generalized parenchymal atrophy and chronic small vessel ischemic disease. 4. Mild paranasal sinus mucosal thickening. 5. Small left mastoid effusion.   CTA: The bilateral common carotid and internal carotid arteries are patent within the neck without significant stenosis. Mild calcified plaque within the distal cervical left ICA.The vertebral arteries are codominant and patent within the neck. Soft plaque results in mild to moderate narrowing at the origin of the left vertebral artery. Incidentally noted aberrant right subclavian artery. No intracranial large vessel occlusion or proximal high-grade arterial stenosis. Mild calcified plaque within the bilateral ICA siphons.  Metrics: [facility] Notification Time: 11/30/2019 15:05:50 Stamp Time: 11/30/2019 15:08:16 Callback Response Time: 11/30/2019 15:09:56  Our recommendations are outlined below.  Recommendations:     [name]  Goal SBP b/w 100-140.     [name]  Start ASA + STATIN if no contraindications.     .  Get CT HEAD W/O (24 hr later) and ECHO.     [name]  Get ESR/CRP, CK, LACTIC ACID, BNP, TROP, LIPID PANEL and A1C.     .  Get WORKUP for  TOXIC/METABOLIC/INFECTIOUS causes.     .  Monitor on telemetry. *Consider ENT eval if above is negative.   Imaging Studies:     .  Echocardiogram - Transthoracic Echocardiogram  Therapies:     .  Physical Therapy, Occupational Therapy, Speech Therapy Assessment When Applicable  Other WorkUp:     .  Check B12 level  Disposition: Neurology Follow Up Recommended  Sign Out:     .  Discussed with Emergency Department Provider  ----------------------------------------------------------------------------------------------------  Chief Complaint: dizziness  History of Present Illness: Patient is a 86 year old Female.  Pt is a 86 YOF with PMH of HTN, HYPOTHYROIDISM, SEASONAL ALLERGIES who presented with room spinning sensation. She had a fall and hit head. she stated that the dizziness was worsened by position maneuvers or position changes. she may have had loss of consciousness, but was not sure, no tongue biting, no bladder/bowel incontinence, no saddle anesthesia coming over to killer symptoms associated with it. she denied any hearing loss, no ringing in our ears, no pain or discharge coming from her ears. she has history of cervical dystonia and has a slight head Tremor going to the right side which she is being managed on outpatient basis with Neurology and Botox. She cannot have MRI due to bladder stim. CTA head and neck: abnormal; lacunar infarct age indeterminate, rt thalamus.   Past Medical History:     . Hypertension     . There is NO history of Diabetes Mellitus     . There is NO history of Hyperlipidemia     . There is NO history of Atrial Fibrillation     . There is NO history  of Coronary Artery Disease     . There is NO history of Stroke  Anticoagulant use:  No  Antiplatelet use: No    Examination: BP(141/53), Pulse(74), Blood Glucose(149) 1A: Level of Consciousness - Alert; keenly responsive + 0 1B: Ask Month and Age - Both Questions Right + 0 1C: Blink Eyes &  Squeeze Hands - Performs Both Tasks + 0 2: Test Horizontal Extraocular Movements - Normal + 0 3: Test Visual Fields - No Visual Loss + 0 4: Test Facial Palsy (Use Grimace if Obtunded) - Normal symmetry + 0 5A: Test Left Arm Motor Drift - No Drift for 10 Seconds + 0 5B: Test Right Arm Motor Drift - No Drift for 10 Seconds + 0 6A: Test Left Leg Motor Drift - No Drift for 5 Seconds + 0 6B: Test Right Leg Motor Drift - No Drift for 5 Seconds + 0 7: Test Limb Ataxia (FNF/Heel-Shin) - No Ataxia + 0 8: Test Sensation - Normal; No sensory loss + 0 9: Test Language/Aphasia - Normal; No aphasia + 0 10: Test Dysarthria - Normal + 0 11: Test Extinction/Inattention - No abnormality + 0  NIHSS Score: 0    Due to the immediate potential for life-threatening deterioration due to underlying acute neurologic illness, I spent 35 minutes providing critical care. This time includes time for face to face visit via telemedicine, review of medical records, imaging studies and discussion of findings with providers, the patient and/or family.   Dr Currie Paris   [facility] [phone]  Case [id number]

## 2019-11-30 NOTE — ED Notes (Signed)
ED Provider at bedside. 

## 2019-11-30 NOTE — Telephone Encounter (Signed)
Pt's son called again stating no one has called him back and he wanted to know if he should just take her to the ED.  Caller was transferred to Nurse Triage

## 2019-11-30 NOTE — Telephone Encounter (Signed)
Patient son called in needing to speak with [name] patient was very dizzy on this morning and fell. The patient son would like to be advised on what to do. Please give the patient son a call at [phone]

## 2019-11-30 NOTE — ED Provider Notes (Signed)
I received this patient in signout from Dr. Charm Barges.  Briefly, she had presented with several days of dizziness and he had obtained CT, CTA to evaluate for acute intracranial pathology.  She had evidence of age-indeterminate lacunar and basal ganglia strokes, no history of CVA.  Unable to obtain MRI due to presence of bladder stimulator.  At time of signout, awaiting evaluation and recommendations from teleneurologist.  I discussed with Dr. Allena Katz who felt that current symptoms were less likely to be due to findings on head CT however because she has no history of stroke he would recommend admission for stroke work-up, PT eval, and initiation of medical management.  Discussed with [facility] neurologist, Dr. Wilford Corner, who is in agreement. Discussed transfer for admission w/ Dr. Jacqulyn Bath, [facility]. Pt updated on plan.   Little, Ambrose Finland, MD 11/30/19 1900

## 2019-11-30 NOTE — Telephone Encounter (Signed)
Patient called and he stated they were in the ER

## 2019-11-30 NOTE — ED Notes (Signed)
HIGH FALL RISK PT, arm band placed on left arm, sr x 2 up. Son at bedside, bed in lowest position, family and pt instructed not to get up w/o staff in room

## 2019-11-30 NOTE — ED Triage Notes (Signed)
Pt arrives with son who reports she called him around 830 this morning stating that she had a fall, also reports to son that over the past few days she has felt dizzy upon waking. Denies LOC, denies blood thinner. Pt reports she hit the right side of her head. Son states he went over, gave her something to eat, and the patient was still having dizziness which is why he brought her in.
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
## 2019-12-01 ENCOUNTER — Observation Stay (HOSPITAL_BASED_OUTPATIENT_CLINIC_OR_DEPARTMENT_OTHER): Payer: Medicare Other

## 2019-12-01 ENCOUNTER — Observation Stay (HOSPITAL_COMMUNITY): Payer: Medicare Other

## 2019-12-01 DIAGNOSIS — E119 Type 2 diabetes mellitus without complications: Secondary | ICD-10-CM | POA: Diagnosis not present

## 2019-12-01 DIAGNOSIS — R42 Dizziness and giddiness: Secondary | ICD-10-CM | POA: Diagnosis not present

## 2019-12-01 DIAGNOSIS — H8110 Benign paroxysmal vertigo, unspecified ear: Secondary | ICD-10-CM | POA: Diagnosis not present

## 2019-12-01 DIAGNOSIS — E7800 Pure hypercholesterolemia, unspecified: Secondary | ICD-10-CM

## 2019-12-01 DIAGNOSIS — G459 Transient cerebral ischemic attack, unspecified: Secondary | ICD-10-CM | POA: Diagnosis not present

## 2019-12-01 DIAGNOSIS — I10 Essential (primary) hypertension: Secondary | ICD-10-CM

## 2019-12-01 DIAGNOSIS — I639 Cerebral infarction, unspecified: Secondary | ICD-10-CM | POA: Diagnosis not present

## 2019-12-01 HISTORY — DX: Dizziness and giddiness: R42

## 2019-12-01 LAB — LIPID PANEL
Cholesterol: 250 mg/dL — ABNORMAL HIGH (ref 0–200)
HDL: 62 mg/dL (ref >40)
LDL Cholesterol: 171 mg/dL — ABNORMAL HIGH (ref 0–99)
Total CHOL/HDL Ratio: 4 RATIO
Triglycerides: 85 mg/dL (ref <150)
VLDL: 17 mg/dL (ref 0–40)

## 2019-12-01 LAB — TROPONIN I (HIGH SENSITIVITY)
Troponin I (High Sensitivity): 6 ng/L (ref <18)
Troponin I (High Sensitivity): 7 ng/L (ref <18)

## 2019-12-01 LAB — BASIC METABOLIC PANEL
Anion gap: 12 (ref 5–15)
BUN: 15 mg/dL (ref 8–23)
CO2: 20 mmol/L — ABNORMAL LOW (ref 22–32)
Calcium: 8.9 mg/dL (ref 8.9–10.3)
Chloride: 109 mmol/L (ref 98–111)
Creatinine, Ser: 0.93 mg/dL (ref 0.44–1.00)
GFR calc Af Amer: >60 mL/min (ref >60)
GFR calc non Af Amer: 55 mL/min — ABNORMAL LOW (ref >60)
Glucose, Bld: 148 mg/dL — ABNORMAL HIGH (ref 70–99)
Potassium: 3.5 mmol/L (ref 3.5–5.1)
Sodium: 141 mmol/L (ref 135–145)

## 2019-12-01 LAB — TSH: TSH: 3.999 u[IU]/mL (ref 0.350–4.500)

## 2019-12-01 LAB — CBC
HCT: 41.2 % (ref 36.0–46.0)
Hemoglobin: 13.4 g/dL (ref 12.0–15.0)
MCH: 29.8 pg (ref 26.0–34.0)
MCHC: 32.5 g/dL (ref 30.0–36.0)
MCV: 91.8 fL (ref 80.0–100.0)
Platelets: 269 10*3/uL (ref 150–400)
RBC: 4.49 MIL/uL (ref 3.87–5.11)
RDW: 13.1 % (ref 11.5–15.5)
WBC: 8.3 10*3/uL (ref 4.0–10.5)
nRBC: 0 % (ref 0.0–0.2)

## 2019-12-01 LAB — CK: Total CK: 71 U/L (ref 38–234)

## 2019-12-01 LAB — GLUCOSE, CAPILLARY
Glucose-Capillary: 150 mg/dL — ABNORMAL HIGH (ref 70–99)
Glucose-Capillary: 98 mg/dL (ref 70–99)
Glucose-Capillary: 136 mg/dL — ABNORMAL HIGH (ref 70–99)
Glucose-Capillary: 233 mg/dL — ABNORMAL HIGH (ref 70–99)

## 2019-12-01 LAB — BRAIN NATRIURETIC PEPTIDE: B Natriuretic Peptide: 76.7 pg/mL (ref 0.0–100.0)

## 2019-12-01 LAB — C-REACTIVE PROTEIN: CRP: 0.8 mg/dL (ref <1.0)

## 2019-12-01 LAB — HEMOGLOBIN A1C
Hgb A1c MFr Bld: 6.2 % — ABNORMAL HIGH (ref 4.8–5.6)
Mean Plasma Glucose: 131.24 mg/dL

## 2019-12-01 LAB — MAGNESIUM: Magnesium: 2.1 mg/dL (ref 1.7–2.4)

## 2019-12-01 LAB — VITAMIN B12: Vitamin B-12: 338 pg/mL (ref 180–914)

## 2019-12-01 LAB — SEDIMENTATION RATE: Sed Rate: 54 mm/hr — ABNORMAL HIGH (ref 0–22)

## 2019-12-01 LAB — SARS CORONAVIRUS 2 (TAT 6-24 HRS): SARS Coronavirus 2: NEGATIVE

## 2019-12-01 IMAGING — CT CT HEAD W/O CM
4 series · 16 of 47 positions shown, 18 images · non-contrast
Comparison: Head and neck CTA [DATE]

CLINICAL DATA: Dizziness.

EXAM:
CT HEAD WITHOUT CONTRAST
TECHNIQUE: Contiguous axial images were obtained from the base of the skull
through the vertex without intravenous contrast.

[Series 3: head wo · axial · 0.42mm/px · z∈[+1178,+1293]mm · 7 of 31 slices shown, 9 images]
[im 4/31  brain]
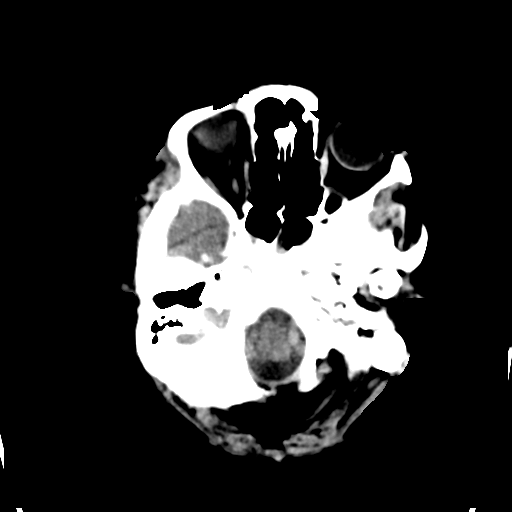
[im 4/31  bone]
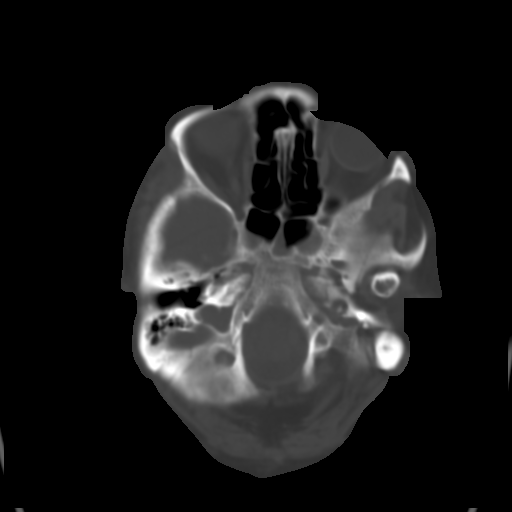
[im 8/31  brain]
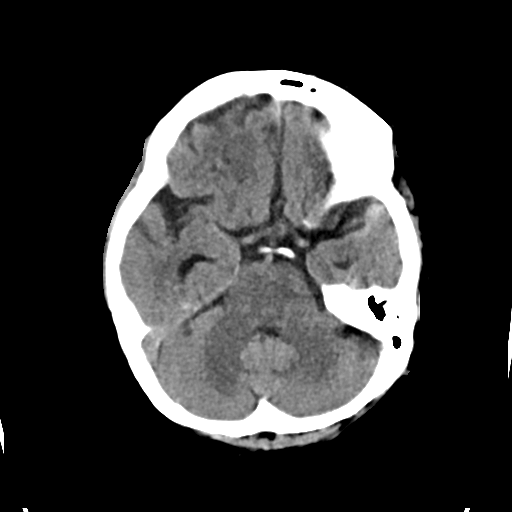
[im 12/31  brain]
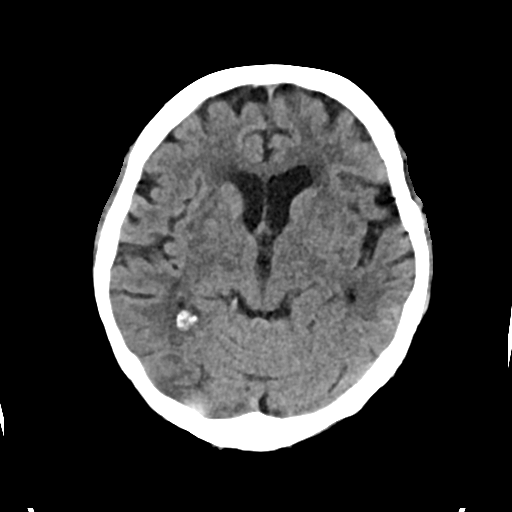
[im 16/31  brain]
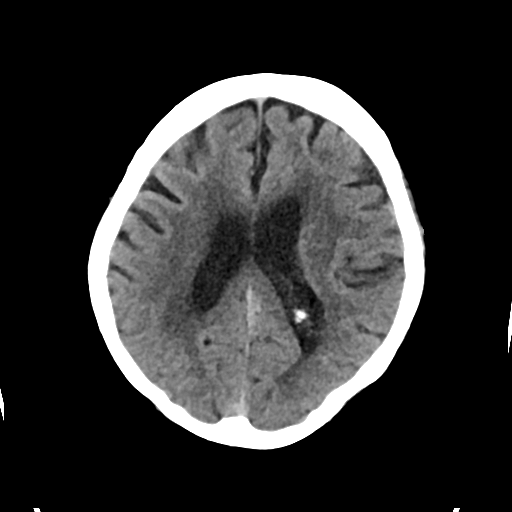
[im 19/31  brain]
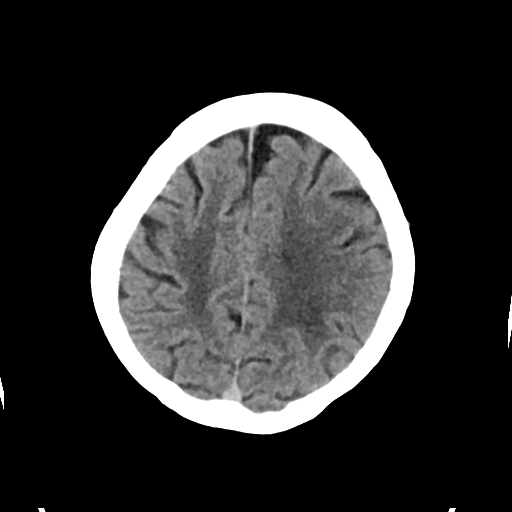
[im 19/31  bone]
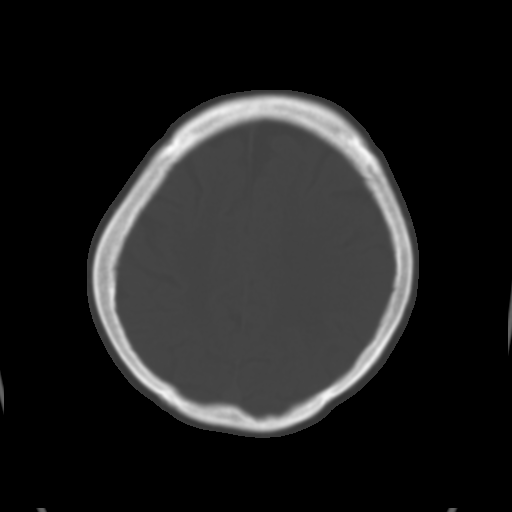
[im 23/31  brain]
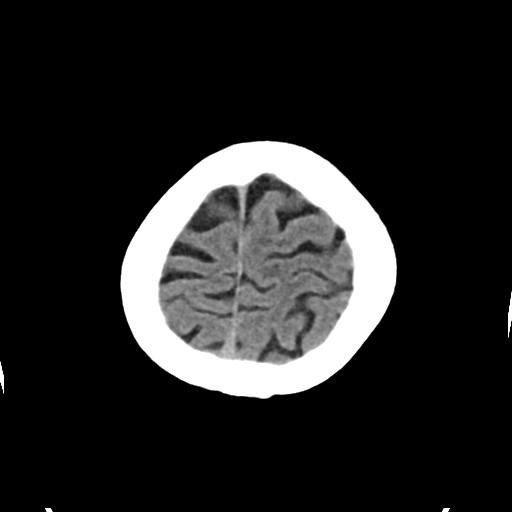
[im 27/31  brain]
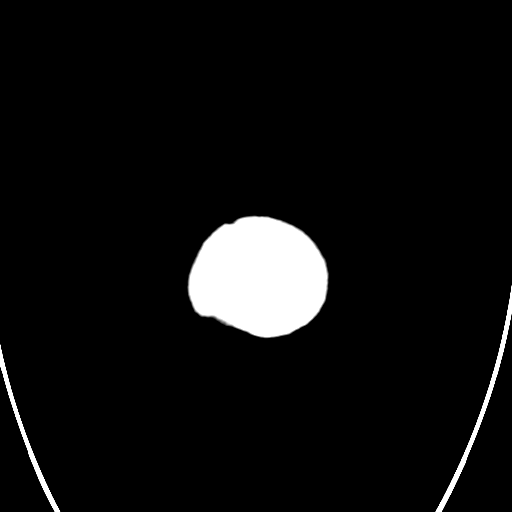

[Series 4: head bone · axial · 0.42mm/px · z∈[+1177,+1209]mm · 3 of 78 slices shown]
[im 8/78  bone]
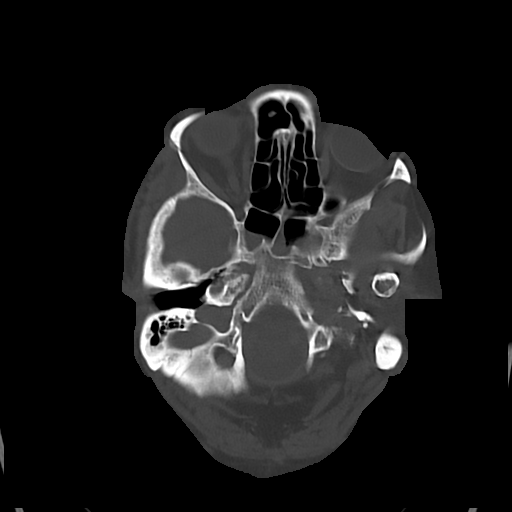
[im 16/78  bone]
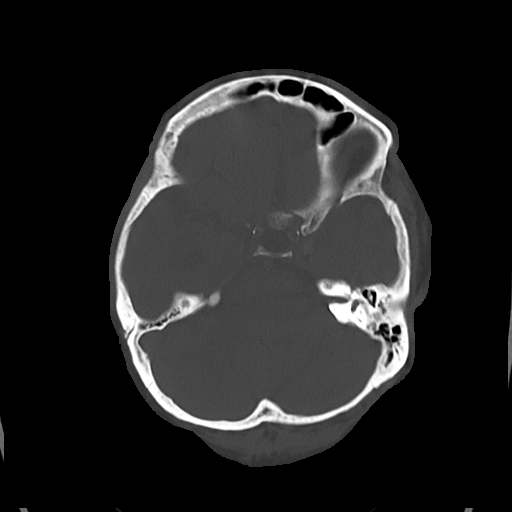
[im 24/78  bone]
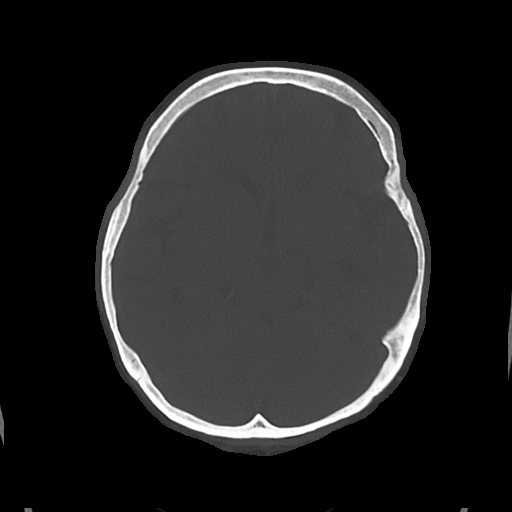

[Series 5: cor soft · coronal · 0.29mm/px · 3 of 63 slices shown]
[im 21/63  brain]
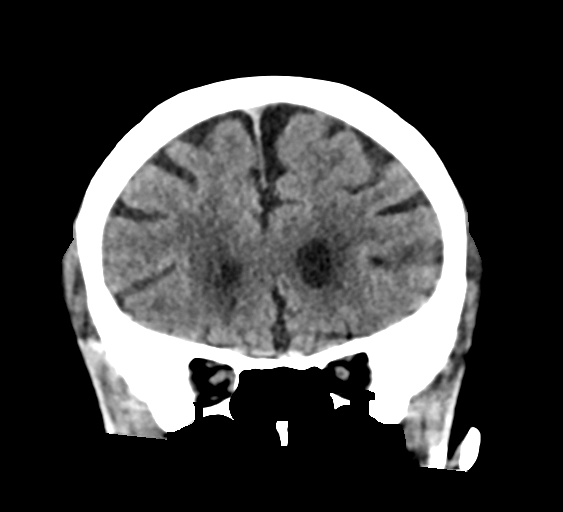
[im 28/63  brain]
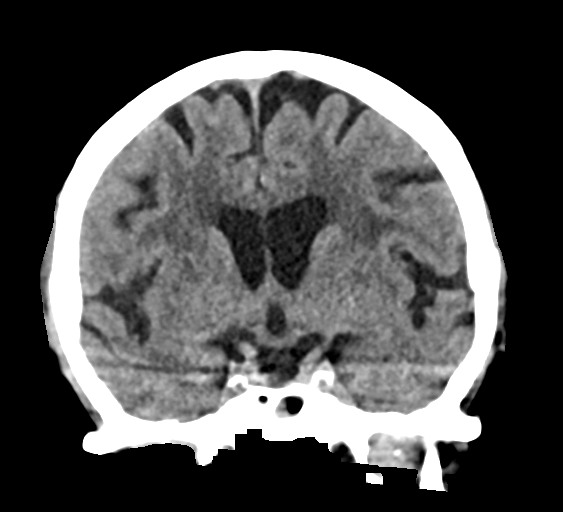
[im 35/63  brain]
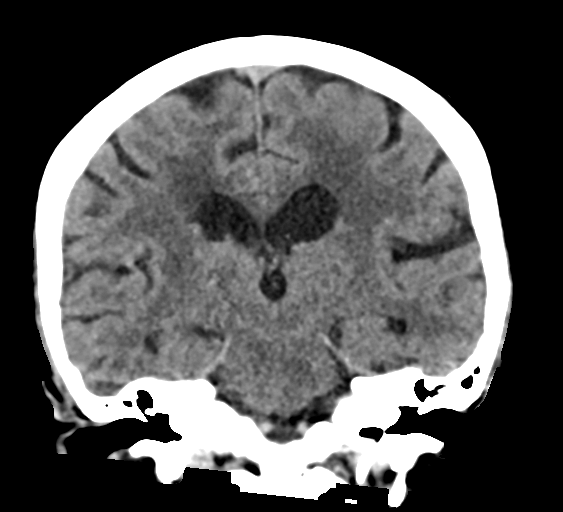

[Series 6: sag soft · sagittal · 0.29mm/px · 3 of 56 slices shown]
[im 19/56  brain]
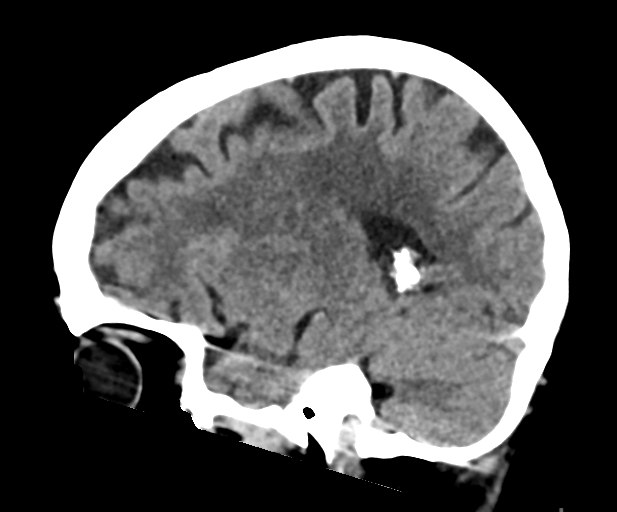
[im 28/56  brain]
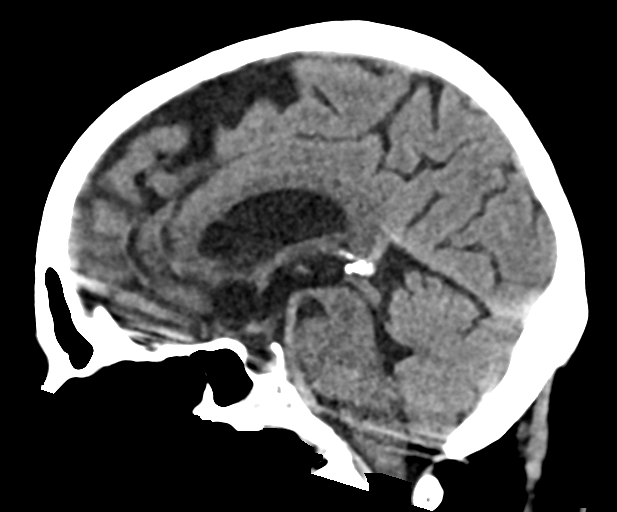
[im 37/56  brain]
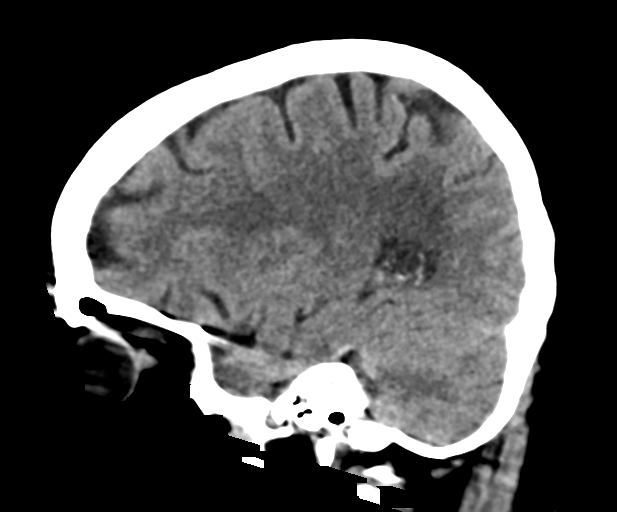

[16 of 47 positions shown; findings below may reference images not displayed]

FINDINGS: Brain: No acute large territory infarct, intracranial hemorrhage,
mass, midline shift, or extra-axial fluid collection is identified.
Patchy hypodensities in the cerebral white matter bilaterally are
unchanged and nonspecific but compatible with moderate chronic small
vessel ischemic disease. More focal hypodensities in the right
thalamus and right basal ganglia are unchanged and compatible with
age indeterminate lacunar infarcts. Cerebral atrophy is not greater
than expected for age.

Vascular: Calcified atherosclerosis at the skull base.

Skull: No fracture or suspicious osseous lesion.

Sinuses/Orbits: Increased fluid in the left greater than right
sphenoid sinuses. Small left mastoid effusion. Bilateral cataract
extraction.

Other: None.
IMPRESSION: 1. No evidence of new intracranial abnormality.
2. Moderate chronic small vessel ischemic disease with unchanged age
indeterminate lacunar infarcts in the right thalamus and right basal
ganglia.
3. Increased fluid in the sphenoid sinuses.

## 2019-12-01 MED ORDER — INSULIN ASPART 100 UNIT/ML ~~LOC~~ SOLN
0.0000 [IU] | Freq: Three times a day (TID) | SUBCUTANEOUS | Status: DC
  Administered 2019-12-01: 2 [IU] via SUBCUTANEOUS
  Administered 2019-12-01: 5 [IU] via SUBCUTANEOUS

## 2019-12-01 MED ORDER — ASPIRIN 81 MG PO TBEC: 81.0000 mg | DELAYED_RELEASE_TABLET | Freq: Every day | ORAL | 0 refills | Status: DC

## 2019-12-01 MED ORDER — ATORVASTATIN CALCIUM 40 MG PO TABS
40.0000 mg | ORAL_TABLET | Freq: Every day | ORAL | Status: DC
  Administered 2019-12-01: 40 mg via ORAL
  Filled 2019-12-01: qty 1

## 2019-12-01 MED ORDER — GABAPENTIN 300 MG PO CAPS: 300.0000 mg | ORAL_CAPSULE | Freq: Every day | ORAL | Status: DC

## 2019-12-01 MED ORDER — PANTOPRAZOLE SODIUM 40 MG PO TBEC: 40.0000 mg | DELAYED_RELEASE_TABLET | Freq: Every day | ORAL | 1 refills | Status: DC

## 2019-12-01 MED ORDER — ATORVASTATIN CALCIUM 40 MG PO TABS: 40.0000 mg | ORAL_TABLET | Freq: Every day | ORAL | 0 refills | Status: DC

## 2019-12-01 MED ORDER — ENOXAPARIN SODIUM 30 MG/0.3ML ~~LOC~~ SOLN
30.0000 mg | SUBCUTANEOUS | Status: DC
  Administered 2019-12-01: 30 mg via SUBCUTANEOUS
  Filled 2019-12-01: qty 0.3

## 2019-12-01 MED ORDER — BUTALBITAL-APAP-CAFFEINE 50-325-40 MG PO TABS
2.0000 | ORAL_TABLET | Freq: Four times a day (QID) | ORAL | Status: DC | PRN
  Administered 2019-12-01 (×2): 2 via ORAL
  Filled 2019-12-01 (×2): qty 2

## 2019-12-01 MED ORDER — MECLIZINE HCL 50 MG PO TABS: 25.0000 mg | ORAL_TABLET | Freq: Three times a day (TID) | ORAL | 0 refills | Status: DC | PRN

## 2019-12-01 MED ORDER — ASPIRIN EC 81 MG PO TBEC
81.0000 mg | DELAYED_RELEASE_TABLET | Freq: Every day | ORAL | Status: DC
  Administered 2019-12-01: 81 mg via ORAL
  Filled 2019-12-01: qty 1

## 2019-12-01 NOTE — Progress Notes (Signed)
SLP Cancellation Note  Patient Details Name: Zahniya Zellars MRN: [id number] DOB: 03-Sep-1930   Cancelled treatment:       Reason Eval/Treat Not Completed: Other (comment). Swallow eval order received, pt has since passed the [place] swallow screen and initiated a diet. Will defer eval. If SLP is needed please reorder.   Harlon Ditty, MA CCC-SLP  Acute Rehabilitation Services Pager [phone] Office [phone]  Claudine Mouton 12/01/2019, 8:14 AM

## 2019-12-01 NOTE — Assessment & Plan Note (Signed)
-   hold home meds - check orthostatic BP in am

## 2019-12-01 NOTE — Assessment & Plan Note (Signed)
-   check A1c - continue SSI and CBG monitoring

## 2019-12-01 NOTE — Discharge Instructions (Signed)
Eating Plan After Stroke A stroke causes damage to the brain cells, which can affect your ability to walk, talk, and even eat. The impact of a stroke is different for everyone, and so is recovery. A good nutrition plan is important for your recovery. It can also lower your risk of another stroke. If you have difficulty chewing and swallowing your food, a dietitian or your stroke care team can help so that you can enjoy eating healthy foods. What are tips for following this plan?  Reading food labels  Choose foods that have less than 300 milligrams (mg) of sodium per serving. Limit your sodium intake to less than 1,500 mg per day.  Avoid foods that have saturated fat and trans fat.  Choose foods that are low in cholesterol. Limit the amount of cholesterol you eat each day to less than 200 mg.  Choose foods that are high in fiber. Eat 20-30 grams (g) of fiber each day.  Avoid foods with added sugar. Check the food label for ingredients such as sugar, corn syrup, honey, fructose, molasses, and cane juice. Shopping  At the grocery store, buy most of your food from areas near the walls of the store. This includes: ? Fresh fruits and vegetables. ? Dry grains, beans, nuts, and seeds. ? Fresh seafood, poultry, lean meats, and eggs. ? Low-fat dairy products.  Buy whole ingredients instead of prepackaged foods.  Buy fresh, in-season fruits and vegetables from local farmers markets.  Buy frozen fruits and vegetables in resealable bags. Cooking  Prepare foods with very little salt. Use herbs or salt-free spices instead.  Cook with heart-healthy oils, such as olive, avocado, canola, soybean, or sunflower oil.  Avoid frying foods. Bake, grill, or broil foods instead.  Remove visible fat and skin from meat and poultry before eating.  Modify food textures as told by your health care provider. Meal planning  Eat a wide variety of colorful fruits and vegetables. Make sure one-half of your  plate is filled with fruits and vegetables at each meal.  Eat fruits and vegetables that are high in potassium, such as: ? Apples, bananas, oranges, and melon. ? Sweet potatoes, spinach, zucchini, and tomatoes.  Eat fish that contain heart-healthy fats (omega-3 fats) at least twice a week. These include salmon, tuna, mackerel, and sardines.  Eat plant foods that are high in omega-3 fats, such as flaxseeds and walnuts. Add these to cereals, yogurt, or pasta dishes.  Eat several servings of high-fiber foods each day, such as fruits, vegetables, whole grains, and beans.  Do not put salt at the table for meals.  When eating out at restaurants: ? Ask the server about low-salt or salt-free food options. ? Avoid fried foods. Look for menu items that are grilled, steamed, broiled, or roasted. ? Ask if your food can be prepared without butter. ? Ask for condiments, such as salad dressings, gravy, or sauces to be served on the side.  If you have difficulty swallowing: ? Choose foods that are softer and easier to chew and swallow. ? Cut foods into small pieces and chew well before swallowing. ? Thicken liquids as told by your health care provider or dietitian. ? Let your health care provider know if your condition does not improve over time. You may need to work with a speech therapist to re-train the muscles that are used for eating. General recommendations  Involve your family and friends in your recovery, if possible. It may be helpful to have a slower meal  time and to plan meals that include foods everyone in the family can eat.  Brush your teeth with fluoride toothpaste twice a day, and floss once a day. Keeping a clean mouth can help you swallow and can also help your appetite.  Drink enough water each day to keep your urine pale yellow. If needed, set reminders or ask your family to help you remember to drink water.  Limit alcohol intake to no more than 1 drink a day for nonpregnant  women and 2 drinks a day for men. One drink equals 12 oz of beer, 5 oz of wine, or 1 oz of hard liquor. Summary  Following this eating plan can help in your stroke recovery and can decrease your risk for another stroke.  Let your health care provider know if you have problems with swallowing. You may need to work with a speech therapist. This information is not intended to replace advice given to you by your health care provider. Make sure you discuss any questions you have with your health care provider. Document Revised: 11/19/2018 Document Reviewed: 10/06/2017 Elsevier Patient Education  [place].   Physical Therapy After a Stroke After a stroke, some people experience physical changes or problems. Physical therapy may be prescribed to help you recover and overcome problems such as:  Inability to move (paralysis) or weakness, typically affecting one side of the body.  Trouble with balance.  Pain, a pins and needles sensation, or numbness in certain parts of the body. You may also have difficulty feeling touch, pressure, or changes in temperature.  Involuntary muscle tightening (spasticity).  Stiffness in muscles and joints.  Altered coordination and reflexes. What causes physical disability after a stroke? A stroke can damage parts of your brain that control your body's normal functions, including your ability to move and to keep your balance. The types of physical problems you have will depend on how severe the stroke was and where it was located in the brain. Weakness or paralysis may affect just your fingers and hands, a whole leg or arm, or an entire side of your body. What is physical therapy? Physical therapy involves using exercises, stretches, and activities to help you regain movement and independence after your stroke. Physical therapy may focus on one or more of the following:  Range of motion. This can help with movement and reduce muscle  stiffness.  Balance. This helps to lower your risk of falling.  Position changes or transfers, such as moving from sitting to standing or from a chair to a bed.  Coordination, such as getting an object from a shelf.  Muscle strength. Muscles may be strengthened with weights or by repeating certain motions.  Functional mobility. This may include stair training or learning how to use a wheelchair, walker, or cane.  Walking (gait training).  Activities of daily living, such as getting out of the car or buttoning a shirt. Why is physical therapy important? It is important to do exercises and follow your rehabilitation plan as told by your physical therapist. Physical therapy can:  Help you regain independence.  Prevent injury from falls by building strength and balance.  Lower your risk of blood clots.  Lower your risk of skin sores (pressure injuries).  Increase physical activity and exercise. This may help lower your risk for another stroke.  Help reduce pain. When will therapy start and where will I have therapy? Your health care provider will decide when it is best for you to start therapy.  In some cases, people start rehabilitation, including physical therapy, as soon as they are medically stable, which may be 24-48 hours after a stroke. Rehabilitation can take place in a few different places, based on your needs. It may take place in:  The hospital or an in-patient rehabilitation hospital.  An outpatient rehabilitation facility.  A long-term care facility.  A community rehabilitation clinic.  Your home. What are assistive devices? Assistive devices are tools to help you move, maintain balance, and manage daily tasks while recovering from a stroke. Your physical therapist may recommend and help you learn to use:  Equipment to help you move, such as wheelchairs, canes, or walkers.  Braces or splints to keep your arms, hands, legs, or feet in a comfortable and safe  position.  Bathtub benches or grab bars to keep you safe in the bathroom.  Special utensils, bowls, and plates that allow you to eat with one hand. It is important to use these devices as told by your health care provider. Summary  After a stroke, some people may experience physical disabilities, such as weakness or paralysis, pain, or balance problems.  Physical therapy involves exercises, stretches, and activities that help to improve your ability to move and to handle daily tasks.  Physical therapy exercises focus on restoring range of motion, balance, coordination, muscle strength, and the ability to move (mobility).  Physical therapy can help you regain independence, prevent falls, and allow you to live a more active lifestyle after a stroke. This information is not intended to replace advice given to you by your health care provider. Make sure you discuss any questions you have with your health care provider. Document Revised: 11/19/2018 Document Reviewed: 11/04/2016 Elsevier Patient Education  [place].  Dizziness Dizziness is a common problem. It is a feeling of unsteadiness or light-headedness. You may feel like you are about to faint. Dizziness can lead to injury if you stumble or fall. Anyone can become dizzy, but dizziness is more common in older adults. This condition can be caused by a number of things, including medicines, dehydration, or illness. Follow these instructions at home: Eating and drinking  Drink enough fluid to keep your urine clear or pale yellow. This helps to keep you from becoming dehydrated. Try to drink more clear fluids, such as water.  Do not drink alcohol.  Limit your caffeine intake if told to do so by your health care provider. Check ingredients and nutrition facts to see if a food or beverage contains caffeine.  Limit your salt (sodium) intake if told to do so by your health care provider. Check ingredients and nutrition facts to see if  a food or beverage contains sodium. Activity  Avoid making quick movements. ? Rise slowly from chairs and steady yourself until you feel okay. ? In the morning, first sit up on the side of the bed. When you feel okay, stand slowly while you hold onto something until you know that your balance is fine.  If you need to stand in one place for a long time, move your legs often. Tighten and relax the muscles in your legs while you are standing.  Do not drive or use heavy machinery if you feel dizzy.  Avoid bending down if you feel dizzy. Place items in your home so that they are easy for you to reach without leaning over. Lifestyle  Do not use any products that contain nicotine or tobacco, such as cigarettes and e-cigarettes. If you need help quitting,  ask your health care provider.  Try to reduce your stress level by using methods such as yoga or meditation. Talk with your health care provider if you need help to manage your stress. General instructions  Watch your dizziness for any changes.  Take over-the-counter and prescription medicines only as told by your health care provider. Talk with your health care provider if you think that your dizziness is caused by a medicine that you are taking.  Tell a friend or a family member that you are feeling dizzy. If he or she notices any changes in your behavior, have this person call your health care provider.  Keep all follow-up visits as told by your health care provider. This is important. Contact a health care provider if:  Your dizziness does not go away.  Your dizziness or light-headedness gets worse.  You feel nauseous.  You have reduced hearing.  You have new symptoms.  You are unsteady on your feet or you feel like the room is spinning. Get help right away if:  You vomit or have diarrhea and are unable to eat or drink anything.  You have problems talking, walking, swallowing, or using your arms, hands, or legs.  You feel  generally weak.  You are not thinking clearly or you have trouble forming sentences. It may take a friend or family member to notice this.  You have chest pain, abdominal pain, shortness of breath, or sweating.  Your vision changes.  You have any bleeding.  You have a severe headache.  You have neck pain or a stiff neck.  You have a fever. These symptoms may represent a serious problem that is an emergency. Do not wait to see if the symptoms will go away. Get medical help right away. Call your local emergency services (911 in the [place] Do not drive yourself to the hospital. Summary  Dizziness is a feeling of unsteadiness or light-headedness. This condition can be caused by a number of things, including medicines, dehydration, or illness.  Anyone can become dizzy, but dizziness is more common in older adults.  Drink enough fluid to keep your urine clear or pale yellow. Do not drink alcohol.  Avoid making quick movements if you feel dizzy. Monitor your dizziness for any changes. This information is not intended to replace advice given to you by your health care provider. Make sure you discuss any questions you have with your health care provider. Document Revised: 08/01/2017 Document Reviewed: 08/31/2016 Elsevier Patient Education  2020 [employer] Follow with Primary MD Saguier, Ramon Dredge, PA-C in 7 days   Get CBC, CMP, checked  by Primary MD next visit.    Activity: As tolerated with Full fall precautions use walker/cane & assistance as needed   Disposition Home    Diet: Heart Healthy  , with feeding assistance and aspiration precautions.  For Heart failure patients - Check your Weight same time everyday, if you gain over 2 pounds, or you develop in leg swelling, experience more shortness of breath or chest pain, call your Primary MD immediately. Follow Cardiac Low Salt Diet and 1.5 lit/day fluid restriction.   On your next visit with your primary care physician please  Get Medicines reviewed and adjusted.   Please request your Prim.MD to go over all Hospital Tests and Procedure/Radiological results at the follow up, please get all Hospital records sent to your Prim MD by signing hospital release before you go home.   If you experience worsening of your admission symptoms, develop shortness  of breath, life threatening emergency, suicidal or homicidal thoughts you must seek medical attention immediately by calling 911 or calling your MD immediately  if symptoms less severe.  You Must read complete instructions/literature along with all the possible adverse reactions/side effects for all the Medicines you take and that have been prescribed to you. Take any new Medicines after you have completely understood and accpet all the possible adverse reactions/side effects.   Do not drive, operating heavy machinery, perform activities at heights, swimming or participation in water activities or provide baby sitting services if your were admitted for syncope or siezures until you have seen by Primary MD or a Neurologist and advised to do so again.  Do not drive when taking Pain medications.    Do not take more than prescribed Pain, Sleep and Anxiety Medications  Special Instructions: If you have smoked or chewed Tobacco  in the last 2 yrs please stop smoking, stop any regular Alcohol  and or any Recreational drug use.  Wear Seat belts while driving.   Please note  You were cared for by a hospitalist during your hospital stay. If you have any questions about your discharge medications or the care you received while you were in the hospital after you are discharged, you can call the unit and asked to speak with the hospitalist on call if the hospitalist that took care of you is not available. Once you are discharged, your primary care physician will handle any further medical issues. Please note that NO REFILLS for any discharge medications will be authorized once you are  discharged, as it is imperative that you return to your primary care physician (or establish a relationship with a primary care physician if you do not have one) for your aftercare needs so that they can reassess your need for medications and monitor your lab values.

## 2019-12-01 NOTE — Assessment & Plan Note (Signed)
-   differential includes TIA vs orthostasis vs metabolic vs less likely infectious vs a central/peripheral etiology - evaluated by teleneuro; appreciate rec's; repeat CTH ordered for 24 hours from initial - continue neuro checks - obtain orthostatic BP; hold home BP meds - check TSH, A1c, lipid - continue ASA - allergic to crestor, ?renal failure, so will hold off on statin for now - follow up B12 - follow up echo

## 2019-12-01 NOTE — Care Management Obs Status (Signed)
MEDICARE OBSERVATION STATUS NOTIFICATION   Patient Details  Name: Leslie Ward MRN: [id number] Date of Birth: 05-05-1931   Medicare Observation Status Notification Given:       Beckie Busing, RN 12/01/2019, 10:41 AM

## 2019-12-01 NOTE — Social Work (Signed)
CSW met with pt at bedside. CSW introduced self and explained her role. CSW completed sbirt with pt.  Pt scored a 0 on the sbirt scale. Pt denied alcohol use. Pt denied substance use. Pt did not need resources at this time.  Leslie Ward, LCSWA, LCASA Clinical Social Worker [phone]  

## 2019-12-01 NOTE — H&P (Signed)
History and Physical    Leslie Ward GLO:[id number] DOB: 11-06-1930 DOA: 11/30/2019  PCP: Leslie Richters, PA-C Patient coming from: home; transfer from [facility]  I have personally briefly reviewed patient's old medical records in [facility]  Chief Complaint: vertigo  HPI: Leslie Ward is an 86 yo CF with PMH hypothyroidism, hypertension, dystonia who presented to the ER with ongoing dizziness and falling at home.  History is provided by the patient.  No other family is present for collateral information.  She states that she has been feeling dizzy for approximately the past 3 days and feeling like her "head is spinning".  She states that she attempted to ambulate to the bathroom earlier on the day of admission and then fell backwards, hitting her head on a carpeted floor.  She then got herself up and back in bed then called her sons.  She was then brought to the hospital via EMS for further evaluation.  She also notes for the past several days that her legs have been "shaking".  When asked to describe it she is describing a restless sensation.  She denies cramping or pain in her legs, nor any spasms.  She underwent head imaging with CT angio head/neck.  This was notable for a small age-indeterminate lacunar infarct within the right basal ganglia and right thalamus.  There was also "perivascular space" versus a chronic lacunar infarct noted in the left basal ganglia.  She also has moderate cerebral atrophy and chronic small vessel disease.  There was no large stenosis appreciated in her neck imaging.  She had mild calcified plaque in her bilateral ICAs.  Due to her bladder stimulator, she could not undergo MRI for further stroke work-up.  She was evaluated by teleneurology and recommended for repeat CT head 24 hours after prior and further TIA/stroke work-up.    Review of Systems: As per HPI otherwise 10 point review of systems negative.   Past Medical History:  Diagnosis Date  .  Dystonia   . Hypertension   . Thyroid disease     Past Surgical History:  Procedure Laterality Date  . NO PAST SURGERIES       reports that she has never smoked. She has never used smokeless tobacco. She reports that she does not drink alcohol or use drugs.  Allergies  Allergen Reactions  . Crestor [Rosuvastatin Calcium] Other (See Comments)    "caused me to go into kidney failure"    Family History  Problem Relation Age of Onset  . Stroke Mother   . Heart attack Father   . Heart Problems Brother     Prior to Admission medications   Medication Sig Start Date End Date Taking? Authorizing Provider  amLODipine (NORVASC) 5 MG tablet Take 1 tablet (5 mg total) by mouth 2 (two) times daily. 07/22/19   Alver Sorrow, NP  Calcium Carbonate (CALCIUM 600 PO) Take 2 tablets by mouth daily.    [provider]  Calcium Polycarbophil (FIBER-CAPS PO) Take 1 capsule by mouth 2 (two) times a day.     [provider]  Cinnamon 500 MG capsule Take 1,000 mg by mouth daily.    [provider]  cyclobenzaprine (FLEXERIL) 5 MG tablet Take 1 tablet (5 mg total) by mouth at bedtime as needed for muscle spasms. Patient not taking: Reported on 11/18/2019 10/15/19   Wanda Plump, MD  fluticasone [facility]) 50 MCG/ACT nasal spray Place 2 sprays into both nostrils daily. 11/18/19   Saguier, Ramon Dredge, PA-C  hydrochlorothiazide (MICROZIDE) 12.5 MG capsule Take 1 capsule on Monday and Friday only.    [provider]  levocetirizine (XYZAL) 5 MG tablet Take 1 tablet (5 mg total) by mouth every evening. 11/18/19   Saguier, Ramon DredgeEdward, PA-C  levothyroxine (SYNTHROID) 50 MCG tablet TAKE 1 TABLET BY MOUTH DAILY 09/29/19   Saguier, Ramon DredgeEdward, PA-C  MELATONIN PO Take 2-3 tablets by mouth at bedtime as needed.    [provider]  Menaquinone-7 (VITAMIN K2 PO) Take 1 tablet by mouth daily.    [provider]  metoprolol tartrate (LOPRESSOR) 50 MG tablet TAKE 1 TABLET(50 MG) BY  MOUTH TWICE DAILY 09/29/19   Saguier, Ramon DredgeEdward, PA-C  Multiple Vitamin (MULTIVITAMIN) tablet Take 1 tablet by mouth daily.    [provider]  NON FORMULARY Cholasta Care-3 tablet daily    [provider]  Omega-3 Fatty Acids (OMEGA 3 PO) Take 2 tablets by mouth daily.    [provider]  predniSONE (DELTASONE) 10 MG tablet 3 tabs x 3 days, 2 tabs x 3 days, 1 tab x 3 days Patient not taking: Reported on 11/18/2019 10/15/19   Wanda PlumpPaz, Jose E, MD  Probiotic Product (PROBIOTIC DAILY PO) Take 1 tablet by mouth daily.    [provider]  telmisartan (MICARDIS) 20 MG tablet Take 1 tablet daily in the evening or at bedtime. 05/31/19   Baldo DaubMunley, Brian J, MD  vitamin C (ASCORBIC ACID) 500 MG tablet Take 500 mg by mouth daily.    [provider]    Physical Exam: Vitals:   11/30/19 2300 11/30/19 2319 11/30/19 2345 12/01/19 0051  BP: (!) 154/60 (!) 154/60 (!) 154/69 (!) 153/61  Pulse: 73 73 79 86  Resp: 17 (!) 23 18 16   Temp:      TempSrc:      SpO2: 98% 97% 99% 98%  Weight:      Height:        General appearance: alert, cooperative and no distress Head: Normocephalic, without obvious abnormality Eyes: EOMI, PERRL Neck: supple, symmetrical, trachea midline Lungs: clear to auscultation bilaterally Heart: regular rate and rhythm and S1, S2 normal Abdomen: soft, NT, ND, BS present Extremities: no edema Skin: mobility and turgor normal Neurologic: subtle LLE weakness (4+/5) no paresthesias, no dysmetria; gait deferred; speech normal  Labs on Admission: I have personally reviewed following labs and imaging studies  CBC: Recent Labs  Lab 11/30/19 1246  WBC 8.2  NEUTROABS 4.9  HGB 14.4  HCT 44.0  MCV 93.2  PLT 293   Basic Metabolic Panel: Recent Labs  Lab 11/30/19 1246  NA 141  K 3.6  CL 103  CO2 27  GLUCOSE 149*  BUN 24*  CREATININE 1.09*  CALCIUM 9.7   GFR: Estimated Creatinine Clearance: 27.5 mL/min (A) (by C-G formula based on SCr of  1.09 mg/dL (H)). Liver Function Tests: No results for input(s): AST, ALT, ALKPHOS, BILITOT, PROT, ALBUMIN in the last 168 hours. No results for input(s): LIPASE, AMYLASE in the last 168 hours. No results for input(s): AMMONIA in the last 168 hours. Coagulation Profile: No results for input(s): INR, PROTIME in the last 168 hours. Cardiac Enzymes: No results for input(s): CKTOTAL, CKMB, CKMBINDEX, TROPONINI in the last 168 hours. BNP (last 3 results) Recent Labs    02/11/19 1647  PROBNP 199   HbA1C: No results for input(s): HGBA1C in the last 72 hours. CBG: No results for input(s): GLUCAP in the last 168 hours. Lipid Profile: No results for input(s): CHOL, HDL,  LDLCALC, TRIG, CHOLHDL, LDLDIRECT in the last 72 hours. Thyroid Function Tests: No results for input(s): TSH, T4TOTAL, FREET4, T3FREE, THYROIDAB in the last 72 hours. Anemia Panel: No results for input(s): VITAMINB12, FOLATE, FERRITIN, TIBC, IRON, RETICCTPCT in the last 72 hours. Urine analysis: No results found for: COLORURINE, APPEARANCEUR, LABSPEC, PHURINE, GLUCOSEU, HGBUR, BILIRUBINUR, KETONESUR, PROTEINUR, UROBILINOGEN, NITRITE, LEUKOCYTESUR  Radiological Exams on Admission: CT Angio Head W/Cm &/Or Wo Cm  Result Date: 11/30/2019 CLINICAL DATA:  Ataxia, stroke suspected. Additional history provided: Several days of dizziness, fall this morning hitting right side of head. EXAM: CT ANGIOGRAPHY HEAD AND NECK TECHNIQUE: Multidetector CT imaging of the head and neck was performed using the standard protocol during bolus administration of intravenous contrast. Multiplanar CT image reconstructions and MIPs were obtained to evaluate the vascular anatomy. Carotid stenosis measurements (when applicable) are obtained utilizing NASCET criteria, using the distal internal carotid diameter as the denominator. CONTRAST:  55mL OMNIPAQUE IOHEXOL 350 MG/ML SOLN COMPARISON:  CT cervical spine 06/01/2019, thyroid ultrasound 06/11/2019 FINDINGS: CT  HEAD FINDINGS Brain: There is no evidence of acute intracranial hemorrhage, intracranial mass, midline shift or extra-axial fluid collection.No demarcated cortical infarction. There are small age-indeterminate lacunar infarcts within the right basal ganglia and right thalamus. Chronic appearing lacunar infarct versus prominent perivascular space within inferior left basal ganglia). Background moderate patchy hypodensity within the cerebral white matter is nonspecific, but consistent chronic small vessel ischemic disease. Moderate generalized parenchymal atrophy. Vascular: No hyperdense vessel.  Atherosclerotic calcifications. Skull: Normal. Negative for fracture or focal lesion. Sinuses: Mild mucosal thickening within the bilateral sphenoid and right maxillary sinuses. Small left mastoid effusion Orbits: No acute abnormality. Review of the MIP images confirms the above findings CTA NECK FINDINGS Aortic arch: Incidentally noted aberrant right subclavian artery. Common origin of the innominate and left common carotid arteries. Atherosclerotic plaque within the visualized aortic arch and proximal major branch vessels of the neck. No hemodynamically significant stenosis of the innominate or proximal subclavian arteries. Right carotid system: CCA and ICA patent within the neck without measurable stenosis. No significant atherosclerotic disease. Left carotid system: CCA and ICA patent within the neck without measurable stenosis. Mild calcified plaque within the distal cervical ICA. Vertebral arteries: The vertebral arteries are codominant and patent within the neck bilaterally. Soft plaque results in mild to moderate narrowing at the origin of the left vertebral artery. Skeleton: No acute bony abnormality or aggressive osseous lesion. Other neck: 2.4 cm right thyroid lobe nodule. This nodule was previously assessed by thyroid ultrasound 06/11/2019. Please refer to this prior report for further description. Upper chest: No  consolidation within the imaged lung apices. Review of the MIP images confirms the above findings CTA HEAD FINDINGS Anterior circulation: The intracranial internal carotid arteries are patent. Mild calcified plaque within the cavernous segments without significant stenosis. The M1 middle cerebral arteries are patent without significant stenosis. No M2 proximal branch occlusion or high-grade proximal stenosis is identified. The anterior cerebral arteries are patent without high-grade proximal stenosis. No intracranial aneurysm is identified. Posterior circulation: The intracranial vertebral arteries are patent without significant stenosis, as is the basilar artery. The posterior cerebral arteries are patent proximally without significant stenosis. Posterior communicating arteries are poorly delineated and may be hypoplastic or absent bilaterally. Venous sinuses: Within limitations of contrast timing, no convincing thrombus. Anatomic variants: As described Review of the MIP images confirms the above findings IMPRESSION: CT head: 1. Small age-indeterminate lacunar infarcts within the right basal ganglia and right thalamus. 2. Prominent perivascular space  versus chronic lacunar infarct within the inferior left basal ganglia. 3. Background moderate generalized parenchymal atrophy and chronic small vessel ischemic disease. 4. Mild paranasal sinus mucosal thickening. 5. Small left mastoid effusion. CTA neck: 1. The bilateral common carotid and internal carotid arteries are patent within the neck without significant stenosis. Mild calcified plaque within the distal cervical left ICA. 2. The vertebral arteries are codominant and patent within the neck. Soft plaque results in mild to moderate narrowing at the origin of the left vertebral artery. 3. Incidentally noted aberrant right subclavian artery. CTA head: 1. No intracranial large vessel occlusion or proximal high-grade arterial stenosis. 2. Mild calcified plaque within  the bilateral ICA siphons. Electronically Signed   By: Kellie Simmering DO   On: 11/30/2019 14:45   CT Angio Neck W and/or Wo Contrast  Result Date: 11/30/2019 CLINICAL DATA:  Ataxia, stroke suspected. Additional history provided: Several days of dizziness, fall this morning hitting right side of head. EXAM: CT ANGIOGRAPHY HEAD AND NECK TECHNIQUE: Multidetector CT imaging of the head and neck was performed using the standard protocol during bolus administration of intravenous contrast. Multiplanar CT image reconstructions and MIPs were obtained to evaluate the vascular anatomy. Carotid stenosis measurements (when applicable) are obtained utilizing NASCET criteria, using the distal internal carotid diameter as the denominator. CONTRAST:  80mL OMNIPAQUE IOHEXOL 350 MG/ML SOLN COMPARISON:  CT cervical spine 06/01/2019, thyroid ultrasound 06/11/2019 FINDINGS: CT HEAD FINDINGS Brain: There is no evidence of acute intracranial hemorrhage, intracranial mass, midline shift or extra-axial fluid collection.No demarcated cortical infarction. There are small age-indeterminate lacunar infarcts within the right basal ganglia and right thalamus. Chronic appearing lacunar infarct versus prominent perivascular space within inferior left basal ganglia). Background moderate patchy hypodensity within the cerebral white matter is nonspecific, but consistent chronic small vessel ischemic disease. Moderate generalized parenchymal atrophy. Vascular: No hyperdense vessel.  Atherosclerotic calcifications. Skull: Normal. Negative for fracture or focal lesion. Sinuses: Mild mucosal thickening within the bilateral sphenoid and right maxillary sinuses. Small left mastoid effusion Orbits: No acute abnormality. Review of the MIP images confirms the above findings CTA NECK FINDINGS Aortic arch: Incidentally noted aberrant right subclavian artery. Common origin of the innominate and left common carotid arteries. Atherosclerotic plaque within the  visualized aortic arch and proximal major branch vessels of the neck. No hemodynamically significant stenosis of the innominate or proximal subclavian arteries. Right carotid system: CCA and ICA patent within the neck without measurable stenosis. No significant atherosclerotic disease. Left carotid system: CCA and ICA patent within the neck without measurable stenosis. Mild calcified plaque within the distal cervical ICA. Vertebral arteries: The vertebral arteries are codominant and patent within the neck bilaterally. Soft plaque results in mild to moderate narrowing at the origin of the left vertebral artery. Skeleton: No acute bony abnormality or aggressive osseous lesion. Other neck: 2.4 cm right thyroid lobe nodule. This nodule was previously assessed by thyroid ultrasound 06/11/2019. Please refer to this prior report for further description. Upper chest: No consolidation within the imaged lung apices. Review of the MIP images confirms the above findings CTA HEAD FINDINGS Anterior circulation: The intracranial internal carotid arteries are patent. Mild calcified plaque within the cavernous segments without significant stenosis. The M1 middle cerebral arteries are patent without significant stenosis. No M2 proximal branch occlusion or high-grade proximal stenosis is identified. The anterior cerebral arteries are patent without high-grade proximal stenosis. No intracranial aneurysm is identified. Posterior circulation: The intracranial vertebral arteries are patent without significant stenosis, as is the basilar  artery. The posterior cerebral arteries are patent proximally without significant stenosis. Posterior communicating arteries are poorly delineated and may be hypoplastic or absent bilaterally. Venous sinuses: Within limitations of contrast timing, no convincing thrombus. Anatomic variants: As described Review of the MIP images confirms the above findings IMPRESSION: CT head: 1. Small age-indeterminate  lacunar infarcts within the right basal ganglia and right thalamus. 2. Prominent perivascular space versus chronic lacunar infarct within the inferior left basal ganglia. 3. Background moderate generalized parenchymal atrophy and chronic small vessel ischemic disease. 4. Mild paranasal sinus mucosal thickening. 5. Small left mastoid effusion. CTA neck: 1. The bilateral common carotid and internal carotid arteries are patent within the neck without significant stenosis. Mild calcified plaque within the distal cervical left ICA. 2. The vertebral arteries are codominant and patent within the neck. Soft plaque results in mild to moderate narrowing at the origin of the left vertebral artery. 3. Incidentally noted aberrant right subclavian artery. CTA head: 1. No intracranial large vessel occlusion or proximal high-grade arterial stenosis. 2. Mild calcified plaque within the bilateral ICA siphons. Electronically Signed   By: Jackey Loge DO   On: 11/30/2019 14:45   EKG: Independently reviewed. NSR  Assessment/Plan  Vertigo - differential includes TIA vs orthostasis vs metabolic vs less likely infectious vs a central/peripheral etiology - evaluated by teleneuro; appreciate rec's; repeat CTH ordered for 24 hours from initial - continue neuro checks - obtain orthostatic BP; hold home BP meds - check TSH, A1c, lipid - continue ASA - allergic to crestor, ?renal failure, so will hold off on statin for now - follow up B12 - follow up echo  HTN -Hold home meds -Check orthostatic blood pressure in a.m.  Type 2 diabetes -Check A1c -Continue SSI and CBG monitoring  Hypothyroidism -Check TSH  DVT prophylaxis: enoxaparin (Lovenox) 40mg  SQ 2 hours prior to surgery then every day Code Status: Full code Family Communication: none Disposition Plan: home Consults called: neuro Admission status: obs   , MD [facility] Pager [phone]  If 7PM-7AM, please contact  night-coverage [email or web address] Use universal [place] password for that web site. If you do not have the password, please call the hospital operator.  12/01/2019, 3:04 AM

## 2019-12-01 NOTE — Consult Note (Signed)
NEURO HOSPITALIST CONSULT NOTE   Requestig physician: Dr. Waldron Labs  Reason for Consult: Possible stroke  History obtained from: Patient and Chart     HPI:                                                                                                                                          Leslie Ward is an 86 y.o. female with a PMHx of dystonia, neck tremor, HTN and hypothyroidism who presented to [facility] on Tuesday morning with a 2 day history of intermittent vertigo. Her last spell of vertigo resulted in a fall, in which she struck her head on a carpeted floor. She got back up, got back in bed and then called her sons. Her sons called EMS who then brought her to [facility].    She also has had a new symptom of "shaking legs" in the night while trying to go to sleep. She states that she can control it for a while, but that the leg shaking comes back. It is accompanied by an unpleasant sensation, but there is no pain, cramping or spasm. The leg shaking interferes with her ability to go to sleep.   CT at [facility] revealed small age-indeterminate lacunar infarcts within the right basal ganglia and right thalamus. A prominent perivascular space versus chronic lacunar infarct within the inferior left basal ganglia was noted. Also seen was background moderate generalized parenchymal atrophy and chronic small vessel ischemic disease.   CTA neck revealed the following: 1. The bilateral common carotid and internal carotid arteries are patent within the neck without significant stenosis. Mild calcified plaque within the distal cervical left ICA. 2. The vertebral arteries are codominant and patent within the neck. Soft plaque results in mild to moderate narrowing at the origin of the left vertebral artery. 3. Incidentally noted aberrant right subclavian artery.   CTA head: 1. No intracranial large vessel occlusion or proximal high-grade arterial stenosis. 2. Mild calcified plaque within the  bilateral ICA siphons.  She has an implanted bladder stimulator and is unable to undergo MRI.   Teleneurology evaluation at [facility] was obtained. NIHSS was 0. Recommendations included repeat CT head 24 hours after the initial CT and further stroke/TIA work up.   ESR mildly elevated for age at 34. HgbA1c mildly elevated at 6.2. TSH normal at 3.999. WBC normal. Vitamin B12 low by neurological standards at 338.    Past Medical History:  Diagnosis Date  . Dystonia   . Hypertension   . Thyroid disease     Past Surgical History:  Procedure Laterality Date  . NO PAST SURGERIES      Family History  Problem Relation Age of Onset  . Stroke Mother   . Heart attack Father   . Heart Problems Brother  Social History:  reports that she has never smoked. She has never used smokeless tobacco. She reports that she does not drink alcohol or use drugs.  Allergies  Allergen Reactions  . Crestor [Rosuvastatin Calcium] Other (See Comments)    "caused me to go into kidney failure"    MEDICATIONS:                                                                                                                     Prior to Admission:  Medications Prior to Admission  Medication Sig Dispense Refill Last Dose  . amLODipine (NORVASC) 5 MG tablet Take 1 tablet (5 mg total) by mouth 2 (two) times daily. 180 tablet 1   . Calcium Carbonate (CALCIUM 600 PO) Take 2 tablets by mouth daily.     . Calcium Polycarbophil (FIBER-CAPS PO) Take 1 capsule by mouth 2 (two) times a day.      . Cinnamon 500 MG capsule Take 1,000 mg by mouth daily.     . cyclobenzaprine (FLEXERIL) 5 MG tablet Take 1 tablet (5 mg total) by mouth at bedtime as needed for muscle spasms. (Patient not taking: Reported on 11/18/2019) 21 tablet 0   . fluticasone (FLONASE) 50 MCG/ACT nasal spray Place 2 sprays into both nostrils daily. 16 g 1   . hydrochlorothiazide (MICROZIDE) 12.5 MG capsule Take 1 capsule on Monday and Friday only.      [name] levocetirizine (XYZAL) 5 MG tablet Take 1 tablet (5 mg total) by mouth every evening. 30 tablet 3   . levothyroxine (SYNTHROID) 50 MCG tablet TAKE 1 TABLET BY MOUTH DAILY 90 tablet 0   . MELATONIN PO Take 2-3 tablets by mouth at bedtime as needed.     . Menaquinone-7 (VITAMIN K2 PO) Take 1 tablet by mouth daily.     . metoprolol tartrate (LOPRESSOR) 50 MG tablet TAKE 1 TABLET(50 MG) BY MOUTH TWICE DAILY 180 tablet 1   . Multiple Vitamin (MULTIVITAMIN) tablet Take 1 tablet by mouth daily.     . NON FORMULARY Cholasta Care-3 tablet daily     . Omega-3 Fatty Acids (OMEGA 3 PO) Take 2 tablets by mouth daily.     . predniSONE (DELTASONE) 10 MG tablet 3 tabs x 3 days, 2 tabs x 3 days, 1 tab x 3 days (Patient not taking: Reported on 11/18/2019) 18 tablet 0   . Probiotic Product (PROBIOTIC DAILY PO) Take 1 tablet by mouth daily.     [name] telmisartan (MICARDIS) 20 MG tablet Take 1 tablet daily in the evening or at bedtime. 90 tablet 0   . vitamin C (ASCORBIC ACID) 500 MG tablet Take 500 mg by mouth daily.      Scheduled: . aspirin EC  81 mg Oral Daily  . enoxaparin (LOVENOX) injection  30 mg Subcutaneous Q24H  . insulin aspart  0-15 Units Subcutaneous TID AC & HS     ROS:  As per HPI. Comprehensive ROS otherwise negative.    Blood pressure (!) 153/61, pulse 86, temperature 98.5 F (36.9 C), temperature source Oral, resp. rate 16, height 4' 11"  (1.499 m), weight 55 kg, SpO2 98 %.   General Examination:                                                                                                       Physical Exam  HEENT-  [place]/AT   Lungs- Respirations unlabored  Extremities- No edema   Neurological Examination Mental Status: Alert, oriented, thought content appropriate.  Speech fluent without evidence of aphasia.  Able to follow all commands without  difficulty. Cranial Nerves: II: PERRL. Visual fields intact with no extinction to DSS.   III,IV, VI: No ptosis. EOMI. No nystagmus seen.  V,VII: Smile symmetric, facial temp sensation equal bilaterally VIII: hearing intact to voice IX,X: No hypophonia XI: Symmetric. Head titubation noted.  XII: Midline tongue extension Motor: Right : Upper extremity   5/5    Left:     Upper extremity   5/5  Lower extremity   5/5     Lower extremity   5/5 No pronator drift.  Sensory: Temp and light touch intact throughout, bilaterally. No extinction.  Deep Tendon Reflexes: 2+ and symmetric throughout Plantars: Right: downgoing   Left: downgoing Cerebellar: No ataxia or tremor with FNF bilaterally.  Gait: Deferred.    Lab Results: Basic Metabolic Panel: Recent Labs  Lab 11/30/19 1246 12/01/19 0344  NA 141 141  K 3.6 3.5  CL 103 109  CO2 27 20*  GLUCOSE 149* 148*  BUN 24* 15  CREATININE 1.09* 0.93  CALCIUM 9.7 8.9  MG  --  2.1    CBC: Recent Labs  Lab 11/30/19 1246 12/01/19 0344  WBC 8.2 8.3  NEUTROABS 4.9  --   HGB 14.4 13.4  HCT 44.0 41.2  MCV 93.2 91.8  PLT 293 269    Cardiac Enzymes: Recent Labs  Lab 12/01/19 0344  CKTOTAL 71    Lipid Panel: Recent Labs  Lab 12/01/19 0344  CHOL 250*  TRIG 85  HDL 62  CHOLHDL 4.0  VLDL 17  LDLCALC 171*    Imaging: CT Angio Head W/Cm &/Or Wo Cm  Result Date: 11/30/2019 CLINICAL DATA:  Ataxia, stroke suspected. Additional history provided: Several days of dizziness, fall this morning hitting right side of head. EXAM: CT ANGIOGRAPHY HEAD AND NECK TECHNIQUE: Multidetector CT imaging of the head and neck was performed using the standard protocol during bolus administration of intravenous contrast. Multiplanar CT image reconstructions and MIPs were obtained to evaluate the vascular anatomy. Carotid stenosis measurements (when applicable) are obtained utilizing NASCET criteria, using the distal internal carotid diameter as the  denominator. CONTRAST:  88m OMNIPAQUE IOHEXOL 350 MG/ML SOLN COMPARISON:  CT cervical spine 06/01/2019, thyroid ultrasound 06/11/2019 FINDINGS: CT HEAD FINDINGS Brain: There is no evidence of acute intracranial hemorrhage, intracranial mass, midline shift or extra-axial fluid collection.No demarcated cortical infarction. There are small age-indeterminate lacunar infarcts within the right basal ganglia and right thalamus. Chronic appearing lacunar  infarct versus prominent perivascular space within inferior left basal ganglia). Background moderate patchy hypodensity within the cerebral white matter is nonspecific, but consistent chronic small vessel ischemic disease. Moderate generalized parenchymal atrophy. Vascular: No hyperdense vessel.  Atherosclerotic calcifications. Skull: Normal. Negative for fracture or focal lesion. Sinuses: Mild mucosal thickening within the bilateral sphenoid and right maxillary sinuses. Small left mastoid effusion Orbits: No acute abnormality. Review of the MIP images confirms the above findings CTA NECK FINDINGS Aortic arch: Incidentally noted aberrant right subclavian artery. Common origin of the innominate and left common carotid arteries. Atherosclerotic plaque within the visualized aortic arch and proximal major branch vessels of the neck. No hemodynamically significant stenosis of the innominate or proximal subclavian arteries. Right carotid system: CCA and ICA patent within the neck without measurable stenosis. No significant atherosclerotic disease. Left carotid system: CCA and ICA patent within the neck without measurable stenosis. Mild calcified plaque within the distal cervical ICA. Vertebral arteries: The vertebral arteries are codominant and patent within the neck bilaterally. Soft plaque results in mild to moderate narrowing at the origin of the left vertebral artery. Skeleton: No acute bony abnormality or aggressive osseous lesion. Other neck: 2.4 cm right thyroid lobe  nodule. This nodule was previously assessed by thyroid ultrasound 06/11/2019. Please refer to this prior report for further description. Upper chest: No consolidation within the imaged lung apices. Review of the MIP images confirms the above findings CTA HEAD FINDINGS Anterior circulation: The intracranial internal carotid arteries are patent. Mild calcified plaque within the cavernous segments without significant stenosis. The M1 middle cerebral arteries are patent without significant stenosis. No M2 proximal branch occlusion or high-grade proximal stenosis is identified. The anterior cerebral arteries are patent without high-grade proximal stenosis. No intracranial aneurysm is identified. Posterior circulation: The intracranial vertebral arteries are patent without significant stenosis, as is the basilar artery. The posterior cerebral arteries are patent proximally without significant stenosis. Posterior communicating arteries are poorly delineated and may be hypoplastic or absent bilaterally. Venous sinuses: Within limitations of contrast timing, no convincing thrombus. Anatomic variants: As described Review of the MIP images confirms the above findings IMPRESSION: CT head: 1. Small age-indeterminate lacunar infarcts within the right basal ganglia and right thalamus. 2. Prominent perivascular space versus chronic lacunar infarct within the inferior left basal ganglia. 3. Background moderate generalized parenchymal atrophy and chronic small vessel ischemic disease. 4. Mild paranasal sinus mucosal thickening. 5. Small left mastoid effusion. CTA neck: 1. The bilateral common carotid and internal carotid arteries are patent within the neck without significant stenosis. Mild calcified plaque within the distal cervical left ICA. 2. The vertebral arteries are codominant and patent within the neck. Soft plaque results in mild to moderate narrowing at the origin of the left vertebral artery. 3. Incidentally noted aberrant  right subclavian artery. CTA head: 1. No intracranial large vessel occlusion or proximal high-grade arterial stenosis. 2. Mild calcified plaque within the bilateral ICA siphons. Electronically Signed   By: Kellie Simmering DO   On: 11/30/2019 14:45   CT Angio Neck W and/or Wo Contrast  Result Date: 11/30/2019 CLINICAL DATA:  Ataxia, stroke suspected. Additional history provided: Several days of dizziness, fall this morning hitting right side of head. EXAM: CT ANGIOGRAPHY HEAD AND NECK TECHNIQUE: Multidetector CT imaging of the head and neck was performed using the standard protocol during bolus administration of intravenous contrast. Multiplanar CT image reconstructions and MIPs were obtained to evaluate the vascular anatomy. Carotid stenosis measurements (when applicable) are obtained utilizing NASCET criteria,  using the distal internal carotid diameter as the denominator. CONTRAST:  35m OMNIPAQUE IOHEXOL 350 MG/ML SOLN COMPARISON:  CT cervical spine 06/01/2019, thyroid ultrasound 06/11/2019 FINDINGS: CT HEAD FINDINGS Brain: There is no evidence of acute intracranial hemorrhage, intracranial mass, midline shift or extra-axial fluid collection.No demarcated cortical infarction. There are small age-indeterminate lacunar infarcts within the right basal ganglia and right thalamus. Chronic appearing lacunar infarct versus prominent perivascular space within inferior left basal ganglia). Background moderate patchy hypodensity within the cerebral white matter is nonspecific, but consistent chronic small vessel ischemic disease. Moderate generalized parenchymal atrophy. Vascular: No hyperdense vessel.  Atherosclerotic calcifications. Skull: Normal. Negative for fracture or focal lesion. Sinuses: Mild mucosal thickening within the bilateral sphenoid and right maxillary sinuses. Small left mastoid effusion Orbits: No acute abnormality. Review of the MIP images confirms the above findings CTA NECK FINDINGS Aortic arch:  Incidentally noted aberrant right subclavian artery. Common origin of the innominate and left common carotid arteries. Atherosclerotic plaque within the visualized aortic arch and proximal major branch vessels of the neck. No hemodynamically significant stenosis of the innominate or proximal subclavian arteries. Right carotid system: CCA and ICA patent within the neck without measurable stenosis. No significant atherosclerotic disease. Left carotid system: CCA and ICA patent within the neck without measurable stenosis. Mild calcified plaque within the distal cervical ICA. Vertebral arteries: The vertebral arteries are codominant and patent within the neck bilaterally. Soft plaque results in mild to moderate narrowing at the origin of the left vertebral artery. Skeleton: No acute bony abnormality or aggressive osseous lesion. Other neck: 2.4 cm right thyroid lobe nodule. This nodule was previously assessed by thyroid ultrasound 06/11/2019. Please refer to this prior report for further description. Upper chest: No consolidation within the imaged lung apices. Review of the MIP images confirms the above findings CTA HEAD FINDINGS Anterior circulation: The intracranial internal carotid arteries are patent. Mild calcified plaque within the cavernous segments without significant stenosis. The M1 middle cerebral arteries are patent without significant stenosis. No M2 proximal branch occlusion or high-grade proximal stenosis is identified. The anterior cerebral arteries are patent without high-grade proximal stenosis. No intracranial aneurysm is identified. Posterior circulation: The intracranial vertebral arteries are patent without significant stenosis, as is the basilar artery. The posterior cerebral arteries are patent proximally without significant stenosis. Posterior communicating arteries are poorly delineated and may be hypoplastic or absent bilaterally. Venous sinuses: Within limitations of contrast timing, no  convincing thrombus. Anatomic variants: As described Review of the MIP images confirms the above findings IMPRESSION: CT head: 1. Small age-indeterminate lacunar infarcts within the right basal ganglia and right thalamus. 2. Prominent perivascular space versus chronic lacunar infarct within the inferior left basal ganglia. 3. Background moderate generalized parenchymal atrophy and chronic small vessel ischemic disease. 4. Mild paranasal sinus mucosal thickening. 5. Small left mastoid effusion. CTA neck: 1. The bilateral common carotid and internal carotid arteries are patent within the neck without significant stenosis. Mild calcified plaque within the distal cervical left ICA. 2. The vertebral arteries are codominant and patent within the neck. Soft plaque results in mild to moderate narrowing at the origin of the left vertebral artery. 3. Incidentally noted aberrant right subclavian artery. CTA head: 1. No intracranial large vessel occlusion or proximal high-grade arterial stenosis. 2. Mild calcified plaque within the bilateral ICA siphons. Electronically Signed   By: KKellie SimmeringDO   On: 11/30/2019 14:45    Assessment: 86[facility] with falls 1. CT head at OSH revealed small age-indeterminate lacunar infarcts within the right basal ganglia and right thalamus. A prominent perivascular space versus chronic lacunar infarct within the inferior left basal ganglia was noted. Also seen was background moderate generalized parenchymal atrophy and chronic small vessel ischemic disease.  2. CTA of head and neck at OSH revealed atherosclerotic findings, without LVO or critical stenosis.  3. New symptoms of leg shaking while lying in bed at night. The patient's description is suggestive of restless legs syndrome.  4. Neck tremor seen on exam. This is chronic and appears most consistent with a manifestation of benign essential tremor.    Recommendations: 1. Repeat CT head at 2 PM today (24 hours after initial CT).  2. Vitamin B12 supplementation, 2000 micrograms po qd.  3. Neurontin 300 mg po qhs (ordered).  4. Unable to obtain MRI brain due to bladder stimulator 5. TTE 6. Cardiac telemetry 7. Agree with starting ASA 8. If work up is inconclusive, will need outpatient follow up with ENT for assessment of possible BPPV 9. BP management   Electronically signed: Dr. Kerney Elbe 12/01/2019, 6:14 AM

## 2019-12-01 NOTE — Hospital Course (Signed)
Leslie Ward is an 86 yo CF with PMH hypothyroidism, hypertension, dystonia who presented to the ER with ongoing dizziness and falling at home.  History is provided by the patient.  No other family is present for collateral information.  She states that she has been feeling dizzy for approximately the past 3 days and feeling like her "head is spinning".  She states that she attempted to ambulate to the bathroom earlier on the day of admission and then fell backwards, hitting her head on a carpeted floor.  She then got herself up and back in bed then called her sons.  She was then brought to the hospital via EMS for further evaluation.  She also notes for the past several days that her legs have been "shaking".  When asked to describe it she is describing a restless sensation.  She denies cramping or pain in her legs, nor any spasms.  She underwent head imaging with CT angio head/neck.  This was notable for a small age-indeterminate lacunar infarct within the right basal ganglia and right thalamus.  There was also "perivascular space" versus a chronic lacunar infarct noted in the left basal ganglia.  She also has moderate cerebral atrophy and chronic small vessel disease.  There was no large stenosis appreciated in her neck imaging.  She had mild calcified plaque in her bilateral ICAs.  Due to her bladder stimulator, she could not undergo MRI for further stroke work-up.  She was evaluated by teleneurology and recommended for repeat CT head 24 hours after prior and further TIA/stroke work-up.

## 2019-12-01 NOTE — Evaluation (Signed)
Occupational Therapy Evaluation and Discharge Patient Details Name: Leslie Ward MRN: [id number] DOB: 04-30-1931 Today's Date: 12/01/2019    History of Present Illness Pt is an 86 yo CF with PMH hypothyroidism, hypertension, dystonia who presented to the ER with ongoing dizziness and falling at home x 3 days.   Clinical Impression   Educated pt and son in fall prevention. Reinforced compensatory strategies for dizziness. Recommended reacher so pt my avoid inverting her head to pick items up and avoid climbing in step stool. Recommended shower seat and for son to be in the home when pt showers for safety. Verbalized understanding of all information.     Follow Up Recommendations  No OT follow up    Equipment Recommendations  Tub/shower seat(son will purchase in the community)    Recommendations for Other Services       Precautions / Restrictions Precautions Precautions: Fall Restrictions Weight Bearing Restrictions: No      Mobility Bed Mobility Overal bed mobility: Modified Independent             General bed mobility comments: guarded, but no assistance  Transfers Overall transfer level: Needs assistance Equipment used: None Transfers: Sit to/from Stand Sit to Stand: Modified independent (Device/Increase time)         General transfer comment: increased time, guarded    Balance Overall balance assessment: Modified Independent(see vestibular assessment)                                         ADL either performed or assessed with clinical judgement   ADL                                         General ADL Comments: Overall functioning at a set up to supervision level for safety.      Vision Patient Visual Report: No change from baseline       Perception     Praxis      Pertinent Vitals/Pain Pain Assessment: Faces Faces Pain Scale: Hurts little more Pain Location: head  Pain Descriptors / Indicators:  Aching Pain Intervention(s): Patient requesting pain meds-RN notified     Hand Dominance Right   Extremity/Trunk Assessment Upper Extremity Assessment Upper Extremity Assessment: Overall WFL for tasks assessed   Lower Extremity Assessment Lower Extremity Assessment: Defer to PT evaluation   Cervical / Trunk Assessment Cervical / Trunk Assessment: (dystonia in neck)   Communication Communication Communication: No difficulties(speaks Spanish as native language)   Cognition Arousal/Alertness: Awake/alert Behavior During Therapy: WFL for tasks assessed/performed Overall Cognitive Status: Within Functional Limits for tasks assessed                                     General Comments       Exercises     Shoulder Instructions      Home Living Family/patient expects to be discharged to:: Private residence Living Arrangements: Alone Available Help at Discharge: Family;Available PRN/intermittently(son lives very close, can work from pt's home) Type of Home: Apartment Home Access: Level entry     Home Layout: One level     Bathroom Shower/Tub: Producer, television/film/video: Handicapped height     Home Equipment: Grab bars -  toilet;Grab bars - tub/shower   Additional Comments: son can secure a shower seat for pt      Prior Functioning/Environment Level of Independence: Independent        Comments: pt has never been hospitalized in her 16 years per son        OT Problem List:        OT Treatment/Interventions:      OT Goals(Current goals can be found in the care plan section) Acute Rehab OT Goals Patient Stated Goal: stop dizziness  OT Frequency:     Barriers to D/C:            Co-evaluation              AM-PAC OT "6 Clicks" Daily Activity     Outcome Measure Help from another person eating meals?: None Help from another person taking care of personal grooming?: None Help from another person toileting, which includes using  toliet, bedpan, or urinal?: None Help from another person bathing (including washing, rinsing, drying)?: None Help from another person to put on and taking off regular upper body clothing?: None Help from another person to put on and taking off regular lower body clothing?: None 6 Click Score: 24   End of Session    Activity Tolerance: Patient tolerated treatment well Patient left: in bed;with call bell/phone within reach;with family/visitor present  OT Visit Diagnosis: Dizziness and giddiness (R42)                Time: 7939-0300 OT Time Calculation (min): 15 min Charges:  OT General Charges $OT Visit: 1 Visit OT Evaluation $OT Eval Low Complexity: 1 Low  [name], OTR/L Acute Rehabilitation Services Pager: [phone] Office: [phone]  Malka So 12/01/2019, 2:51 PM

## 2019-12-01 NOTE — Assessment & Plan Note (Signed)
-   check TSH

## 2019-12-01 NOTE — Discharge Summary (Signed)
Leslie Ward, is a 86 y.o. female  DOB [birth year]  MRN [id number].  Admission date:  11/30/2019  Admitting Physician  Dwyane Dee, MD  Discharge Date:  12/01/2019   Primary MD  Saguier, Percell Miller, PA-C  Recommendations for primary care physician for things to follow:  - Patient to follow with PT as outpatient   Admission Diagnosis  Dizziness [R42] CVA (cerebral vascular accident) [facility]) [I63.9] Cerebrovascular accident (CVA), unspecified mechanism ([place]) [I63.9] Vertigo [R42]   Discharge Diagnosis  Dizziness [R42] CVA (cerebral vascular accident) ([place]) [I63.9] Cerebrovascular accident (CVA), unspecified mechanism ([place]) [I63.9] Vertigo [R42]    Active Problems:   Hypertension   Type 2 diabetes mellitus ([place])   Hypothyroidism (acquired)   Vertigo      Past Medical History:  Diagnosis Date  . Dystonia   . Hypertension   . Thyroid disease     Past Surgical History:  Procedure Laterality Date  . NO PAST SURGERIES         History of present illness and  Hospital Course:     Kindly see H&P for history of present illness and admission details, please review complete Labs, Consult reports and Test reports for all details in brief  HPI  from the history and physical done on the day of admission 11/30/2019  HPI: Leslie Ward is an 86 yo CF with PMH hypothyroidism, hypertension, dystonia who presented to the ER with ongoing dizziness and falling at home.  History is provided by the patient.  No other family is present for collateral information.  She states that she has been feeling dizzy for approximately the past 3 days and feeling like her "head is spinning".  She states that she attempted to ambulate to the bathroom earlier on the day of admission and then fell backwards, hitting her head on a carpeted floor.  She then got herself up and back in bed then called her sons.  She was then  brought to the hospital via EMS for further evaluation.  She also notes for the past several days that her legs have been "shaking".  When asked to describe it she is describing a restless sensation.  She denies cramping or pain in her legs, nor any spasms.  She underwent head imaging with CT angio head/neck.  This was notable for a small age-indeterminate lacunar infarct within the right basal ganglia and right thalamus.  There was also "perivascular space" versus a chronic lacunar infarct noted in the left basal ganglia.  She also has moderate cerebral atrophy and chronic small vessel disease.  There was no large stenosis appreciated in her neck imaging.  She had mild calcified plaque in her bilateral ICAs.  Due to her bladder stimulator, she could not undergo MRI for further stroke work-up.  She was evaluated by teleneurology and recommended for repeat CT head 24 hours after prior and further TIA/stroke work-up.  Hospital Course    Vertigo -Finding highly suspicious for BPPV, discussed with neurology, patient findings clinically significant for BPPV, especially with repeat CT  head with no acute findings(CT has been read by Dr. Roda Shutters), she had no significant events on telemetry, 2D echo with no evidence of embolic source. -She was seen by PT/vestibular PT and OT, recommendation has been made for outpatient PT. -She will be started on as needed meclizine. -Will be started empirically on aspirin.  Hypertension - Continue home medication  Hyperlipidemia -Is on fish oil at home, intolerant to Crestor, so started on Lipitor 40 mg daily with LDL of 171.  Diabetes mellitus type 2, controlled -A1c is 6.2  Hypothyroidism -Continue with Synthroid.  Patient with history of bladder incontinence with bladder stimulator, son requests referral to urologist in town, have discussed with urology on-call, specialist with female incontinence is Dr. Sherron Monday, I have given the patient his information so he  can schedule an appointment with him   Discharge Condition:  stable   Follow UP  Follow-up Information    Baldo Daub, MD .   Specialty: Cardiology Contact information: [address] [place] [address] [phone]        Alfredo Martinez, MD Follow up in 2 week(s).   Specialty: Urology Why: for bladder incontinance with bladder stimulator Contact information: [address] [place] [address] [phone]             Discharge Instructions  and  Discharge Medications     Discharge Instructions    Discharge instructions   Complete by: As directed    Follow with Primary MD Saguier, Ramon Dredge, PA-C in 7 days   Get CBC, CMP, checked  by Primary MD next visit.    Activity: As tolerated with Full fall precautions use walker/cane & assistance as needed   Disposition Home    Diet: Heart Healthy  , with feeding assistance and aspiration precautions.  For Heart failure patients - Check your Weight same time everyday, if you gain over 2 pounds, or you develop in leg swelling, experience more shortness of breath or chest pain, call your Primary MD immediately. Follow Cardiac Low Salt Diet and 1.5 lit/day fluid restriction.   On your next visit with your primary care physician please Get Medicines reviewed and adjusted.   Please request your Prim.MD to go over all Hospital Tests and Procedure/Radiological results at the follow up, please get all Hospital records sent to your Prim MD by signing hospital release before you go home.   If you experience worsening of your admission symptoms, develop shortness of breath, life threatening emergency, suicidal or homicidal thoughts you must seek medical attention immediately by calling 911 or calling your MD immediately  if symptoms less severe.  You Must read complete instructions/literature along with all the possible adverse reactions/side effects for all the Medicines you take and that have been prescribed to you.  Take any new Medicines after you have completely understood and accpet all the possible adverse reactions/side effects.   Do not drive, operating heavy machinery, perform activities at heights, swimming or participation in water activities or provide baby sitting services if your were admitted for syncope or siezures until you have seen by Primary MD or a Neurologist and advised to do so again.  Do not drive when taking Pain medications.    Do not take more than prescribed Pain, Sleep and Anxiety Medications  Special Instructions: If you have smoked or chewed Tobacco  in the last 2 yrs please stop smoking, stop any regular Alcohol  and or any Recreational drug use.  Wear Seat belts while driving.  Please note  You were cared for by a hospitalist during your hospital stay. If you have any questions about your discharge medications or the care you received while you were in the hospital after you are discharged, you can call the unit and asked to speak with the hospitalist on call if the hospitalist that took care of you is not available. Once you are discharged, your primary care physician will handle any further medical issues. Please note that NO REFILLS for any discharge medications will be authorized once you are discharged, as it is imperative that you return to your primary care physician (or establish a relationship with a primary care physician if you do not have one) for your aftercare needs so that they can reassess your need for medications and monitor your lab values.   Increase activity slowly   Complete by: As directed      Allergies as of 12/01/2019      Reactions   Crestor [rosuvastatin Calcium] Other (See Comments)   "caused me to go into kidney failure"      Medication List    STOP taking these medications   cyclobenzaprine 5 MG tablet Commonly known as: FLEXERIL     TAKE these medications   amLODipine 5 MG tablet Commonly known as: NORVASC Take 1 tablet (5 mg  total) by mouth 2 (two) times daily.   aspirin 81 MG EC tablet Take 1 tablet (81 mg total) by mouth daily. Start taking on: December 02, 2019   CALCIUM 600 PO Take 2 tablets by mouth daily.   Cinnamon 500 MG capsule Take 1,000 mg by mouth daily.   FIBER-CAPS PO Take 1 capsule by mouth 2 (two) times a day.   fluticasone 50 MCG/ACT nasal spray Commonly known as: FLONASE Place 2 sprays into both nostrils daily.   hydrochlorothiazide 12.5 MG capsule Commonly known as: MICROZIDE Take 12.5 mg by mouth 2 (two) times a week. Monday and Friday   levocetirizine 5 MG tablet Commonly known as: XYZAL Take 1 tablet (5 mg total) by mouth every evening.   levothyroxine 50 MCG tablet Commonly known as: SYNTHROID TAKE 1 TABLET BY MOUTH DAILY What changed: when to take this   meclizine 50 MG tablet Commonly known as: ANTIVERT Take 0.5 tablets (25 mg total) by mouth 3 (three) times daily as needed for dizziness.   MELATONIN PO Take 2-3 tablets by mouth at bedtime as needed (sleep).   metoprolol tartrate 50 MG tablet Commonly known as: LOPRESSOR TAKE 1 TABLET(50 MG) BY MOUTH TWICE DAILY What changed: See the new instructions.   multivitamin tablet Take 1 tablet by mouth daily.   NON FORMULARY Take 3 tablets by mouth 3 (three) times daily. [place]   OMEGA 3 PO Take 2 tablets by mouth every evening.   pantoprazole 40 MG tablet Commonly known as: Protonix Take 1 tablet (40 mg total) by mouth daily.   PROBIOTIC DAILY PO Take 1 tablet by mouth daily.   telmisartan 20 MG tablet Commonly known as: MICARDIS Take 1 tablet daily in the evening or at bedtime. What changed:   how much to take  how to take this  when to take this  additional instructions   vitamin C 500 MG tablet Commonly known as: ASCORBIC ACID Take 500 mg by mouth every evening.   VITAMIN K2 PO Take 1 tablet by mouth at bedtime.         Diet and Activity recommendation: See Discharge  Instructions above  Consults obtained -  Neurology   Major procedures and Radiology Reports - PLEASE review detailed and final reports for all details, in brief -    CT Angio Head W/Cm &/Or Wo Cm  Result Date: 11/30/2019 CLINICAL DATA:  Ataxia, stroke suspected. Additional history provided: Several days of dizziness, fall this morning hitting right side of head. EXAM: CT ANGIOGRAPHY HEAD AND NECK TECHNIQUE: Multidetector CT imaging of the head and neck was performed using the standard protocol during bolus administration of intravenous contrast. Multiplanar CT image reconstructions and MIPs were obtained to evaluate the vascular anatomy. Carotid stenosis measurements (when applicable) are obtained utilizing NASCET criteria, using the distal internal carotid diameter as the denominator. CONTRAST:  80mL OMNIPAQUE IOHEXOL 350 MG/ML SOLN COMPARISON:  CT cervical spine 06/01/2019, thyroid ultrasound 06/11/2019 FINDINGS: CT HEAD FINDINGS Brain: There is no evidence of acute intracranial hemorrhage, intracranial mass, midline shift or extra-axial fluid collection.No demarcated cortical infarction. There are small age-indeterminate lacunar infarcts within the right basal ganglia and right thalamus. Chronic appearing lacunar infarct versus prominent perivascular space within inferior left basal ganglia). Background moderate patchy hypodensity within the cerebral white matter is nonspecific, but consistent chronic small vessel ischemic disease. Moderate generalized parenchymal atrophy. Vascular: No hyperdense vessel.  Atherosclerotic calcifications. Skull: Normal. Negative for fracture or focal lesion. Sinuses: Mild mucosal thickening within the bilateral sphenoid and right maxillary sinuses. Small left mastoid effusion Orbits: No acute abnormality. Review of the MIP images confirms the above findings CTA NECK FINDINGS Aortic arch: Incidentally noted aberrant right subclavian artery. Common origin of the  innominate and left common carotid arteries. Atherosclerotic plaque within the visualized aortic arch and proximal major branch vessels of the neck. No hemodynamically significant stenosis of the innominate or proximal subclavian arteries. Right carotid system: CCA and ICA patent within the neck without measurable stenosis. No significant atherosclerotic disease. Left carotid system: CCA and ICA patent within the neck without measurable stenosis. Mild calcified plaque within the distal cervical ICA. Vertebral arteries: The vertebral arteries are codominant and patent within the neck bilaterally. Soft plaque results in mild to moderate narrowing at the origin of the left vertebral artery. Skeleton: No acute bony abnormality or aggressive osseous lesion. Other neck: 2.4 cm right thyroid lobe nodule. This nodule was previously assessed by thyroid ultrasound 06/11/2019. Please refer to this prior report for further description. Upper chest: No consolidation within the imaged lung apices. Review of the MIP images confirms the above findings CTA HEAD FINDINGS Anterior circulation: The intracranial internal carotid arteries are patent. Mild calcified plaque within the cavernous segments without significant stenosis. The M1 middle cerebral arteries are patent without significant stenosis. No M2 proximal branch occlusion or high-grade proximal stenosis is identified. The anterior cerebral arteries are patent without high-grade proximal stenosis. No intracranial aneurysm is identified. Posterior circulation: The intracranial vertebral arteries are patent without significant stenosis, as is the basilar artery. The posterior cerebral arteries are patent proximally without significant stenosis. Posterior communicating arteries are poorly delineated and may be hypoplastic or absent bilaterally. Venous sinuses: Within limitations of contrast timing, no convincing thrombus. Anatomic variants: As described Review of the MIP images  confirms the above findings IMPRESSION: CT head: 1. Small age-indeterminate lacunar infarcts within the right basal ganglia and right thalamus. 2. Prominent perivascular space versus chronic lacunar infarct within the inferior left basal ganglia. 3. Background moderate generalized parenchymal atrophy and chronic small vessel ischemic disease. 4. Mild paranasal sinus mucosal thickening. 5. Small left mastoid effusion. CTA neck: 1. The bilateral  common carotid and internal carotid arteries are patent within the neck without significant stenosis. Mild calcified plaque within the distal cervical left ICA. 2. The vertebral arteries are codominant and patent within the neck. Soft plaque results in mild to moderate narrowing at the origin of the left vertebral artery. 3. Incidentally noted aberrant right subclavian artery. CTA head: 1. No intracranial large vessel occlusion or proximal high-grade arterial stenosis. 2. Mild calcified plaque within the bilateral ICA siphons. Electronically Signed   By: Jackey Loge DO   On: 11/30/2019 14:45   CT Angio Neck W and/or Wo Contrast  Result Date: 11/30/2019 CLINICAL DATA:  Ataxia, stroke suspected. Additional history provided: Several days of dizziness, fall this morning hitting right side of head. EXAM: CT ANGIOGRAPHY HEAD AND NECK TECHNIQUE: Multidetector CT imaging of the head and neck was performed using the standard protocol during bolus administration of intravenous contrast. Multiplanar CT image reconstructions and MIPs were obtained to evaluate the vascular anatomy. Carotid stenosis measurements (when applicable) are obtained utilizing NASCET criteria, using the distal internal carotid diameter as the denominator. CONTRAST:  51mL OMNIPAQUE IOHEXOL 350 MG/ML SOLN COMPARISON:  CT cervical spine 06/01/2019, thyroid ultrasound 06/11/2019 FINDINGS: CT HEAD FINDINGS Brain: There is no evidence of acute intracranial hemorrhage, intracranial mass, midline shift or extra-axial  fluid collection.No demarcated cortical infarction. There are small age-indeterminate lacunar infarcts within the right basal ganglia and right thalamus. Chronic appearing lacunar infarct versus prominent perivascular space within inferior left basal ganglia). Background moderate patchy hypodensity within the cerebral white matter is nonspecific, but consistent chronic small vessel ischemic disease. Moderate generalized parenchymal atrophy. Vascular: No hyperdense vessel.  Atherosclerotic calcifications. Skull: Normal. Negative for fracture or focal lesion. Sinuses: Mild mucosal thickening within the bilateral sphenoid and right maxillary sinuses. Small left mastoid effusion Orbits: No acute abnormality. Review of the MIP images confirms the above findings CTA NECK FINDINGS Aortic arch: Incidentally noted aberrant right subclavian artery. Common origin of the innominate and left common carotid arteries. Atherosclerotic plaque within the visualized aortic arch and proximal major branch vessels of the neck. No hemodynamically significant stenosis of the innominate or proximal subclavian arteries. Right carotid system: CCA and ICA patent within the neck without measurable stenosis. No significant atherosclerotic disease. Left carotid system: CCA and ICA patent within the neck without measurable stenosis. Mild calcified plaque within the distal cervical ICA. Vertebral arteries: The vertebral arteries are codominant and patent within the neck bilaterally. Soft plaque results in mild to moderate narrowing at the origin of the left vertebral artery. Skeleton: No acute bony abnormality or aggressive osseous lesion. Other neck: 2.4 cm right thyroid lobe nodule. This nodule was previously assessed by thyroid ultrasound 06/11/2019. Please refer to this prior report for further description. Upper chest: No consolidation within the imaged lung apices. Review of the MIP images confirms the above findings CTA HEAD FINDINGS  Anterior circulation: The intracranial internal carotid arteries are patent. Mild calcified plaque within the cavernous segments without significant stenosis. The M1 middle cerebral arteries are patent without significant stenosis. No M2 proximal branch occlusion or high-grade proximal stenosis is identified. The anterior cerebral arteries are patent without high-grade proximal stenosis. No intracranial aneurysm is identified. Posterior circulation: The intracranial vertebral arteries are patent without significant stenosis, as is the basilar artery. The posterior cerebral arteries are patent proximally without significant stenosis. Posterior communicating arteries are poorly delineated and may be hypoplastic or absent bilaterally. Venous sinuses: Within limitations of contrast timing, no convincing thrombus. Anatomic variants: As described  Review of the MIP images confirms the above findings IMPRESSION: CT head: 1. Small age-indeterminate lacunar infarcts within the right basal ganglia and right thalamus. 2. Prominent perivascular space versus chronic lacunar infarct within the inferior left basal ganglia. 3. Background moderate generalized parenchymal atrophy and chronic small vessel ischemic disease. 4. Mild paranasal sinus mucosal thickening. 5. Small left mastoid effusion. CTA neck: 1. The bilateral common carotid and internal carotid arteries are patent within the neck without significant stenosis. Mild calcified plaque within the distal cervical left ICA. 2. The vertebral arteries are codominant and patent within the neck. Soft plaque results in mild to moderate narrowing at the origin of the left vertebral artery. 3. Incidentally noted aberrant right subclavian artery. CTA head: 1. No intracranial large vessel occlusion or proximal high-grade arterial stenosis. 2. Mild calcified plaque within the bilateral ICA siphons. Electronically Signed   By: Jackey Loge DO   On: 11/30/2019 14:45   ECHOCARDIOGRAM  COMPLETE BUBBLE STUDY  Result Date: 12/01/2019    ECHOCARDIOGRAM REPORT   Patient Name:   Leslie Ward Date of Exam: 12/01/2019 Medical Rec #:  [id number]        Height:       59.0 in Accession #:    [id number]       Weight:       121.2 lb Date of Birth:  23-Jul-1931        BSA:          1.491 m Patient Age:    89 years         BP:           153/61 mmHg Patient Gender: F                HR:           81 bpm. Exam Location:  Inpatient Procedure: 2D Echo, Cardiac Doppler and Color Doppler Indications:    TIA 435.9 / G45.9  History:        Patient has no prior history of Echocardiogram examinations.                 Risk Factors:Hypertension. Thyroid Disease.  Sonographer:    Elmarie Shiley Dance Referring Phys: [id number] DAVID GIRGUIS IMPRESSIONS  1. Hyperdynamic LV function with intracavitary gradient of 23 mmHG. No signs of SAM or HOCM. Left ventricular ejection fraction, by estimation, is 70 to 75%. The left ventricle has hyperdynamic function. The left ventricle has no regional wall motion abnormalities. Left ventricular diastolic parameters are consistent with Grade I diastolic dysfunction (impaired relaxation).  2. Right ventricular systolic function is normal. The right ventricular size is normal. Tricuspid regurgitation signal is inadequate for assessing PA pressure.  3. The mitral valve is grossly normal. No evidence of mitral valve regurgitation. No evidence of mitral stenosis.  4. The aortic valve is tricuspid. Aortic valve regurgitation is not visualized. Mild aortic valve sclerosis is present, with no evidence of aortic valve stenosis.  5. The inferior vena cava is normal in size with greater than 50% respiratory variability, suggesting right atrial pressure of 3 mmHg.  6. Agitated saline contrast bubble study was negative, with no evidence of any interatrial shunt. Conclusion(s)/Recommendation(s): No intracardiac source of embolism detected on this transthoracic study. A transesophageal echocardiogram is  recommended to exclude cardiac source of embolism if clinically indicated. FINDINGS  Left Ventricle: Hyperdynamic LV function with intracavitary gradient of 23 mmHG. No signs of SAM or HOCM. Left ventricular ejection fraction, by estimation, is 70 to 75%.  The left ventricle has hyperdynamic function. The left ventricle has no regional wall motion abnormalities. The left ventricular internal cavity size was normal in size. There is no left ventricular hypertrophy. Left ventricular diastolic parameters are consistent with Grade I diastolic dysfunction (impaired relaxation). Normal left ventricular filling pressure. Right Ventricle: The right ventricular size is normal. No increase in right ventricular wall thickness. Right ventricular systolic function is normal. Tricuspid regurgitation signal is inadequate for assessing PA pressure. Left Atrium: Left atrial size was normal in size. Right Atrium: Right atrial size was normal in size. Pericardium: Trivial pericardial effusion is present. Mitral Valve: The mitral valve is grossly normal. Mild mitral annular calcification. No evidence of mitral valve regurgitation. No evidence of mitral valve stenosis. Tricuspid Valve: The tricuspid valve is grossly normal. Tricuspid valve regurgitation is not demonstrated. No evidence of tricuspid stenosis. Aortic Valve: The aortic valve is tricuspid. Aortic valve regurgitation is not visualized. Mild aortic valve sclerosis is present, with no evidence of aortic valve stenosis. Mild aortic valve annular calcification. Pulmonic Valve: The pulmonic valve was grossly normal. Pulmonic valve regurgitation is not visualized. No evidence of pulmonic stenosis. Aorta: The aortic root and ascending aorta are structurally normal, with no evidence of dilitation. Venous: The inferior vena cava is normal in size with greater than 50% respiratory variability, suggesting right atrial pressure of 3 mmHg. IAS/Shunts: The atrial septum is grossly normal.  Agitated saline contrast was given intravenously to evaluate for intracardiac shunting. Agitated saline contrast bubble study was negative, with no evidence of any interatrial shunt.  LEFT VENTRICLE PLAX 2D LVIDd:         3.30 cm LVIDs:         2.10 cm LV PW:         1.10 cm LV IVS:        1.10 cm LVOT diam:     1.70 cm LV SV:         71 LV SV Index:   47 LVOT Area:     2.27 cm  RIGHT VENTRICLE            IVC RV Basal diam:  1.80 cm    IVC diam: 1.20 cm RV S prime:     9.14 cm/s TAPSE (M-mode): 1.8 cm LEFT ATRIUM             Index       RIGHT ATRIUM          Index LA diam:        3.10 cm 2.08 cm/m  RA Area:     8.90 cm LA Vol (A2C):   27.5 ml 18.45 ml/m RA Volume:   14.20 ml 9.53 ml/m LA Vol (A4C):   46.9 ml 31.46 ml/m LA Biplane Vol: 37.3 ml 25.02 ml/m  AORTIC VALVE LVOT Vmax:   124.00 cm/s LVOT Vmean:  79.900 cm/s LVOT VTI:    0.311 m  AORTA Ao Root diam: 2.70 cm Ao Asc diam:  3.10 cm MITRAL VALVE MV Area (PHT): 2.45 cm     SHUNTS MV Decel Time: 310 msec     Systemic VTI:  0.31 m MV E velocity: 75.10 cm/s   Systemic Diam: 1.70 cm MV A velocity: 124.00 cm/s MV E/A ratio:  0.61 Lennie OdorWesley O'Neal MD Electronically signed by Lennie OdorWesley O'Neal MD Signature Date/Time: 12/01/2019/3:36:20 PM    Final     Micro Results    Recent Results (from the past 240 hour(s))  SARS CORONAVIRUS 2 (TAT 6-24 HRS) Nasopharyngeal  Nasopharyngeal Swab     Status: None   Collection Time: 11/30/19  5:52 PM   Specimen: Nasopharyngeal Swab  Result Value Ref Range Status   SARS Coronavirus 2 NEGATIVE NEGATIVE Final    Comment: (NOTE) SARS-CoV-2 target nucleic acids are NOT DETECTED. The SARS-CoV-2 RNA is generally detectable in upper and lower respiratory specimens during the acute phase of infection. Negative results do not preclude SARS-CoV-2 infection, do not rule out co-infections with other pathogens, and should not be used as the sole basis for treatment or other patient management decisions. Negative results must be  combined with clinical observations, patient history, and epidemiological information. The expected result is Negative. Fact Sheet for Patients: HairSlick.no Fact Sheet for Healthcare Providers: quierodirigir.com This test is not yet approved or cleared by the [place] FDA and  has been authorized for detection and/or diagnosis of SARS-CoV-2 by FDA under an Emergency Use Authorization (EUA). This EUA will remain  in effect (meaning this test can be used) for the duration of the COVID-19 declaration under Section 56 4(b)(1) of the Act, 21 U.S.C. section 360bbb-3(b)(1), unless the authorization is terminated or revoked sooner. Performed at [facility], [address] [place], [place] [address]        Today   Subjective:   Leslie Ward today has no headache,no chest or abdominal pain,no new weakness tingling or numbness, no new significant deficits, she tolerated activity with PT with minimal symptoms.  Objective:   Blood pressure 140/74, pulse 100, temperature 98.1 F (36.7 C), temperature source Oral, resp. rate 20, height 4\' 11"  (1.499 m), weight 55 kg, SpO2 99 %.  No intake or output data in the 24 hours ending 12/01/19 1648  Exam Awake Alert, Oriented x 3, No new F.N deficits, Normal affect Symmetrical Chest wall movement, Good air movement bilaterally, CTAB RRR,No Gallops,Rubs or new Murmurs, No Parasternal Heave +ve B.Sounds, Abd Soft, Non tender,No rebound -guarding or rigidity. No Cyanosis, Clubbing or edema, No new Rash or bruise  Data Review   CBC w Diff:  Lab Results  Component Value Date   WBC 8.3 12/01/2019   HGB 13.4 12/01/2019   HCT 41.2 12/01/2019   PLT 269 12/01/2019   LYMPHOPCT 33 11/30/2019   MONOPCT 5 11/30/2019   EOSPCT 1 11/30/2019   BASOPCT 0 11/30/2019    CMP:  Lab Results  Component Value Date   NA 141 12/01/2019   NA 142 05/11/2019   K 3.5 12/01/2019   CL 109  12/01/2019   CO2 20 (L) 12/01/2019   BUN 15 12/01/2019   BUN 24 05/11/2019   CREATININE 0.93 12/01/2019   PROT 6.8 05/11/2019   ALBUMIN 4.2 05/11/2019   BILITOT 0.5 05/11/2019   ALKPHOS 65 05/11/2019   AST 13 05/11/2019   ALT 11 05/11/2019  .   Total Time in preparing paper work, data evaluation and todays exam - 35 minutes  05/13/2019 M.D on 12/01/2019 at 4:48 PM  [facility]   Office  [phone]

## 2019-12-01 NOTE — Care Management CC44 (Signed)
Condition Code 44 Documentation Completed  Patient Details  Name: Leslie Ward MRN: [id number] Date of Birth: [birth year]   Condition Code 44 given:  Yes Patient signature on Condition Code 44 notice:  Yes Documentation of 2 MD's agreement:  Yes Code 44 added to claim:  Yes    Beckie Busing, RN 12/01/2019, 10:43 AM

## 2019-12-01 NOTE — Progress Notes (Signed)
STROKE TEAM PROGRESS NOTE   INTERVAL HISTORY Son and RN at bedside.  Patient recounted HPI with me.  Sunday morning, she got up from bed had acute onset vertigo for short period time.  During the day she has no problem.  Monday morning she got up again from bed had acute onset vertigo for short period time.  Tuesday (yesterday) she got up from bed acute onset of vertigo, worse than previous 2 days, went to bathroom, got up from toilet washing her hands, severe vertigo, she tried to hold off on the towel bar, but towel bar broke off and she fell to the ground. Later on the whole day she was mildly dizzy, not feeling well, son sent her here for evaluation.  Currently, patient lying in bed, awake alert, neurologically intact.  Dix-Hallpike maneuver triggered mild lightheadedness, short lasting, no vertigo as before.  CT repeat no acute abnormality.  Vitals:   11/30/19 2300 11/30/19 2319 11/30/19 2345 12/01/19 0051  BP: (!) 154/60 (!) 154/60 (!) 154/69 (!) 153/61  Pulse: 73 73 79 86  Resp: 17 (!) 23 18 16   Temp:      TempSrc:      SpO2: 98% 97% 99% 98%  Weight:      Height:        CBC:  Recent Labs  Lab 11/30/19 1246 12/01/19 0344  WBC 8.2 8.3  NEUTROABS 4.9  --   HGB 14.4 13.4  HCT 44.0 41.2  MCV 93.2 91.8  PLT 293 299    Basic Metabolic Panel:  Recent Labs  Lab 11/30/19 1246 12/01/19 0344  NA 141 141  K 3.6 3.5  CL 103 109  CO2 27 20*  GLUCOSE 149* 148*  BUN 24* 15  CREATININE 1.09* 0.93  CALCIUM 9.7 8.9  MG  --  2.1   Lipid Panel:     Component Value Date/Time   CHOL 250 (H) 12/01/2019 0344   CHOL 279 (H) 05/11/2019 1007   TRIG 85 12/01/2019 0344   HDL 62 12/01/2019 0344   HDL 64 05/11/2019 1007   CHOLHDL 4.0 12/01/2019 0344   VLDL 17 12/01/2019 0344   LDLCALC 171 (H) 12/01/2019 0344   LDLCALC 187 (H) 05/11/2019 1007   HgbA1c:  Lab Results  Component Value Date   HGBA1C 6.2 (H) 12/01/2019   Urine Drug Screen: No results found for: LABOPIA, COCAINSCRNUR,  LABBENZ, AMPHETMU, THCU, LABBARB  Alcohol Level No results found for: ETH  IMAGING past 24 hours CT Angio Head W/Cm &/Or Wo Cm  Result Date: 11/30/2019 CLINICAL DATA:  Ataxia, stroke suspected. Additional history provided: Several days of dizziness, fall this morning hitting right side of head. EXAM: CT ANGIOGRAPHY HEAD AND NECK TECHNIQUE: Multidetector CT imaging of the head and neck was performed using the standard protocol during bolus administration of intravenous contrast. Multiplanar CT image reconstructions and MIPs were obtained to evaluate the vascular anatomy. Carotid stenosis measurements (when applicable) are obtained utilizing NASCET criteria, using the distal internal carotid diameter as the denominator. CONTRAST:  60mL OMNIPAQUE IOHEXOL 350 MG/ML SOLN COMPARISON:  CT cervical spine 06/01/2019, thyroid ultrasound 06/11/2019 FINDINGS: CT HEAD FINDINGS Brain: There is no evidence of acute intracranial hemorrhage, intracranial mass, midline shift or extra-axial fluid collection.No demarcated cortical infarction. There are small age-indeterminate lacunar infarcts within the right basal ganglia and right thalamus. Chronic appearing lacunar infarct versus prominent perivascular space within inferior left basal ganglia). Background moderate patchy hypodensity within the cerebral white matter is nonspecific, but consistent chronic small  vessel ischemic disease. Moderate generalized parenchymal atrophy. Vascular: No hyperdense vessel.  Atherosclerotic calcifications. Skull: Normal. Negative for fracture or focal lesion. Sinuses: Mild mucosal thickening within the bilateral sphenoid and right maxillary sinuses. Small left mastoid effusion Orbits: No acute abnormality. Review of the MIP images confirms the above findings CTA NECK FINDINGS Aortic arch: Incidentally noted aberrant right subclavian artery. Common origin of the innominate and left common carotid arteries. Atherosclerotic plaque within the  visualized aortic arch and proximal major branch vessels of the neck. No hemodynamically significant stenosis of the innominate or proximal subclavian arteries. Right carotid system: CCA and ICA patent within the neck without measurable stenosis. No significant atherosclerotic disease. Left carotid system: CCA and ICA patent within the neck without measurable stenosis. Mild calcified plaque within the distal cervical ICA. Vertebral arteries: The vertebral arteries are codominant and patent within the neck bilaterally. Soft plaque results in mild to moderate narrowing at the origin of the left vertebral artery. Skeleton: No acute bony abnormality or aggressive osseous lesion. Other neck: 2.4 cm right thyroid lobe nodule. This nodule was previously assessed by thyroid ultrasound 06/11/2019. Please refer to this prior report for further description. Upper chest: No consolidation within the imaged lung apices. Review of the MIP images confirms the above findings CTA HEAD FINDINGS Anterior circulation: The intracranial internal carotid arteries are patent. Mild calcified plaque within the cavernous segments without significant stenosis. The M1 middle cerebral arteries are patent without significant stenosis. No M2 proximal branch occlusion or high-grade proximal stenosis is identified. The anterior cerebral arteries are patent without high-grade proximal stenosis. No intracranial aneurysm is identified. Posterior circulation: The intracranial vertebral arteries are patent without significant stenosis, as is the basilar artery. The posterior cerebral arteries are patent proximally without significant stenosis. Posterior communicating arteries are poorly delineated and may be hypoplastic or absent bilaterally. Venous sinuses: Within limitations of contrast timing, no convincing thrombus. Anatomic variants: As described Review of the MIP images confirms the above findings IMPRESSION: CT head: 1. Small age-indeterminate  lacunar infarcts within the right basal ganglia and right thalamus. 2. Prominent perivascular space versus chronic lacunar infarct within the inferior left basal ganglia. 3. Background moderate generalized parenchymal atrophy and chronic small vessel ischemic disease. 4. Mild paranasal sinus mucosal thickening. 5. Small left mastoid effusion. CTA neck: 1. The bilateral common carotid and internal carotid arteries are patent within the neck without significant stenosis. Mild calcified plaque within the distal cervical left ICA. 2. The vertebral arteries are codominant and patent within the neck. Soft plaque results in mild to moderate narrowing at the origin of the left vertebral artery. 3. Incidentally noted aberrant right subclavian artery. CTA head: 1. No intracranial large vessel occlusion or proximal high-grade arterial stenosis. 2. Mild calcified plaque within the bilateral ICA siphons. Electronically Signed   By: Jackey Loge DO   On: 11/30/2019 14:45   CT Angio Neck W and/or Wo Contrast  Result Date: 11/30/2019 CLINICAL DATA:  Ataxia, stroke suspected. Additional history provided: Several days of dizziness, fall this morning hitting right side of head. EXAM: CT ANGIOGRAPHY HEAD AND NECK TECHNIQUE: Multidetector CT imaging of the head and neck was performed using the standard protocol during bolus administration of intravenous contrast. Multiplanar CT image reconstructions and MIPs were obtained to evaluate the vascular anatomy. Carotid stenosis measurements (when applicable) are obtained utilizing NASCET criteria, using the distal internal carotid diameter as the denominator. CONTRAST:  1mL OMNIPAQUE IOHEXOL 350 MG/ML SOLN COMPARISON:  CT cervical spine 06/01/2019, thyroid ultrasound  06/11/2019 FINDINGS: CT HEAD FINDINGS Brain: There is no evidence of acute intracranial hemorrhage, intracranial mass, midline shift or extra-axial fluid collection.No demarcated cortical infarction. There are small  age-indeterminate lacunar infarcts within the right basal ganglia and right thalamus. Chronic appearing lacunar infarct versus prominent perivascular space within inferior left basal ganglia). Background moderate patchy hypodensity within the cerebral white matter is nonspecific, but consistent chronic small vessel ischemic disease. Moderate generalized parenchymal atrophy. Vascular: No hyperdense vessel.  Atherosclerotic calcifications. Skull: Normal. Negative for fracture or focal lesion. Sinuses: Mild mucosal thickening within the bilateral sphenoid and right maxillary sinuses. Small left mastoid effusion Orbits: No acute abnormality. Review of the MIP images confirms the above findings CTA NECK FINDINGS Aortic arch: Incidentally noted aberrant right subclavian artery. Common origin of the innominate and left common carotid arteries. Atherosclerotic plaque within the visualized aortic arch and proximal major branch vessels of the neck. No hemodynamically significant stenosis of the innominate or proximal subclavian arteries. Right carotid system: CCA and ICA patent within the neck without measurable stenosis. No significant atherosclerotic disease. Left carotid system: CCA and ICA patent within the neck without measurable stenosis. Mild calcified plaque within the distal cervical ICA. Vertebral arteries: The vertebral arteries are codominant and patent within the neck bilaterally. Soft plaque results in mild to moderate narrowing at the origin of the left vertebral artery. Skeleton: No acute bony abnormality or aggressive osseous lesion. Other neck: 2.4 cm right thyroid lobe nodule. This nodule was previously assessed by thyroid ultrasound 06/11/2019. Please refer to this prior report for further description. Upper chest: No consolidation within the imaged lung apices. Review of the MIP images confirms the above findings CTA HEAD FINDINGS Anterior circulation: The intracranial internal carotid arteries are  patent. Mild calcified plaque within the cavernous segments without significant stenosis. The M1 middle cerebral arteries are patent without significant stenosis. No M2 proximal branch occlusion or high-grade proximal stenosis is identified. The anterior cerebral arteries are patent without high-grade proximal stenosis. No intracranial aneurysm is identified. Posterior circulation: The intracranial vertebral arteries are patent without significant stenosis, as is the basilar artery. The posterior cerebral arteries are patent proximally without significant stenosis. Posterior communicating arteries are poorly delineated and may be hypoplastic or absent bilaterally. Venous sinuses: Within limitations of contrast timing, no convincing thrombus. Anatomic variants: As described Review of the MIP images confirms the above findings IMPRESSION: CT head: 1. Small age-indeterminate lacunar infarcts within the right basal ganglia and right thalamus. 2. Prominent perivascular space versus chronic lacunar infarct within the inferior left basal ganglia. 3. Background moderate generalized parenchymal atrophy and chronic small vessel ischemic disease. 4. Mild paranasal sinus mucosal thickening. 5. Small left mastoid effusion. CTA neck: 1. The bilateral common carotid and internal carotid arteries are patent within the neck without significant stenosis. Mild calcified plaque within the distal cervical left ICA. 2. The vertebral arteries are codominant and patent within the neck. Soft plaque results in mild to moderate narrowing at the origin of the left vertebral artery. 3. Incidentally noted aberrant right subclavian artery. CTA head: 1. No intracranial large vessel occlusion or proximal high-grade arterial stenosis. 2. Mild calcified plaque within the bilateral ICA siphons. Electronically Signed   By: Jackey Loge DO   On: 11/30/2019 14:45    PHYSICAL EXAM  Temp:  [97.9 F (36.6 C)-98.3 F (36.8 C)] 98.1 F (36.7 C) (04/21  1624) Pulse Rate:  [73-100] 100 (04/21 1624) Resp:  [16-23] 20 (04/21 1624) BP: (102-154)/(48-74) 140/74 (04/21 1624) SpO2:  [  95 %-100 %] 99 % (04/21 1624)  General - Well nourished, well developed, in no apparent distress.  Ophthalmologic - fundi not visualized due to noncooperation.  Cardiovascular - Regular rhythm and rate.  Mental Status -  Level of arousal and orientation to time, place, and person were intact. Language including expression, naming, repetition, comprehension was assessed and found intact. Fund of Knowledge was assessed and was intact.  Cranial Nerves II - XII - II - Visual field intact OU. III, IV, VI - Extraocular movements intact. V - Facial sensation intact bilaterally. VII - Facial movement intact bilaterally. VIII - Hearing & vestibular intact bilaterally. X - Palate elevates symmetrically. XI - Chin turning & shoulder shrug intact bilaterally. XII - Tongue protrusion intact.  Motor Strength - The patient's strength was normal in all extremities and pronator drift was absent.  Bulk was normal and fasciculations were absent.   Motor Tone - Muscle tone was assessed at the neck and appendages and was normal.  Reflexes - The patient's reflexes were symmetrical in all extremities and she had no pathological reflexes.  Sensory - Light touch, temperature/pinprick were assessed and were symmetrical.    Coordination - The patient had normal movements in the hands and feet with no ataxia or dysmetria.  Tremor was absent.  Dix-Hallpike testing with brief period of lightheadedness, no vertigo.  Gait and Station - deferred.   ASSESSMENT/PLAN [name] is a 86 y.o. female with history of dystonia, neck tremor, HTN and hypothyroidism presenting to [facility] with intermittent vertigo causing a fall where she struck her head and uncontrollable leg shaking when trying to sleep.    BPPV  Vertigo description typical for BPPV  Neurologically  intact  CT no acute abnormality  Dix-Hallpike with lightheadedness but no vertigo  Recommend PT referral for BPPV maneuver training  Abnormal CT findings  Neuro exam intact, no deficit  CT head age indeterminate R basal ganglia and thalamic infarcts. Possible old inferior L basal ganglia infarct. Small vessel disease. Atrophy.   CTA head no LVO. Calcified plaque B ICA siphons  CTA neck mild distal L cervical ICA atherosclerosis. Soft plaque origin L VA w/ mild to moderate narrowing. Aberrant R subclavian artery.   MRI  / MRA  Unable d/t implantable bladder stimulator  Repeat CT head at 24h no acute abnormality  2D Echo w/ bubble EF 70 to 75%, no PFO  LDL 171  HgbA1c 6.2  Lovenox 30 mg sq daily for VTE prophylaxis  No antithrombotic prior to admission, now on aspirin 81 mg daily.  Continue on discharge  Therapy recommendations:  none   Disposition:  pending   Pt need PT referral for BPPV maneuver training   Hypertension  Stable . Long-term BP goal normotensive  Hyperlipidemia  Home meds:  Fish oil  Intolerant to crestor -> kidney failure  Now on lipitor 40  LDL 171, goal < 70  Continue statin at discharge  Other Stroke Risk Factors  Advanced age  Family hx stroke (mother)  Other Active Problems  Hyperthyroidism on synthroid   RLS new neurontin at hs    Cervical dystonia follow with Dr. Terrace Arabia at [facility] day # 1  Neurology will sign off. Please call with questions. Pt will follow up with Dr. Terrace Arabia at [facility] on 12/29/19. Thanks for the consult.  Marvel Plan, MD PhD Stroke Neurology 12/01/2019 6:07 PM   To contact Stroke Continuity provider, please refer to [email or web address]. After hours, contact General  Neurology

## 2019-12-01 NOTE — Progress Notes (Signed)
  Echocardiogram 2D Echocardiogram has been performed.  Dorena Dew Kym Scannell 12/01/2019, 2:43 PM

## 2019-12-13 ENCOUNTER — Ambulatory Visit (INDEPENDENT_AMBULATORY_CARE_PROVIDER_SITE_OTHER): Payer: Medicare Other | Admitting: Medical

## 2019-12-13 ENCOUNTER — Other Ambulatory Visit: Payer: Self-pay

## 2019-12-13 VITALS — BP 122/63 | HR 67 | Temp 97.7°F | Resp 16 | Ht 59.0 in | Wt 119.2 lb

## 2019-12-13 DIAGNOSIS — I10 Essential (primary) hypertension: Secondary | ICD-10-CM | POA: Diagnosis not present

## 2019-12-13 DIAGNOSIS — R42 Dizziness and giddiness: Secondary | ICD-10-CM

## 2019-12-13 MED ORDER — MECLIZINE HCL 12.5 MG PO TABS: 12.5000 mg | ORAL_TABLET | Freq: Three times a day (TID) | ORAL | 0 refills | Status: DC | PRN

## 2019-12-13 NOTE — Progress Notes (Signed)
Subjective:    Patient ID: Leslie Ward, female    DOB: [birth year], 86 y.o.   MRN: [id number]  HPI  Pt in for follow up.  She has much less vertigo than she had in the emergency dept. Pt had 2 cts and the study were negative. Pt seen by neurologist and they advised. Vertigo -Finding highly suspicious for BPPV, discussed with neurology, patient findings clinically significant for BPPV, especially with repeat CT head with no acute findings(CT has been read by Dr. Roda Shutters), she had no significant events on telemetry, 2D echo with no evidence of embolic source. -She was seen by PT/vestibular PT and OT, recommendation has been made for outpatient PT. -She will be started on as needed meclizine. -Will be started empirically on aspirin.  Pt did not get mri since has bladder stimulator not compatbile.  I reviewed both CT studies today in office.   Pt states overall better than other day but still having vertigo. Some mild blurred vision over past week or so with dizziness. Come and goes. Vision little better overall then than was in ED.  On gross motor or sensory function deficits excpet poor balance. She is using a walker since this started.   Pt is scheduled for covid vaccine this coming Friday.   Review of Systems  Constitutional: Negative for chills, fatigue and fever.  Respiratory: Negative for cough, chest tightness, shortness of breath and wheezing.   Cardiovascular: Negative for chest pain and palpitations.  Gastrointestinal: Negative for abdominal pain.  Musculoskeletal: Negative for back pain.  Skin: Negative for rash.  Neurological: Positive for dizziness. Negative for tremors, seizures, syncope, speech difficulty and weakness.  Hematological: Negative for adenopathy. Does not bruise/bleed easily.  Psychiatric/Behavioral: Negative for behavioral problems and confusion.    Past Medical History:  Diagnosis Date  . Dystonia   . Hypertension   . Thyroid disease      Social  History   Socioeconomic History  . Marital status: Widowed    Spouse name: Not on file  . Number of children: 1  . Years of education: 6 or 7 years of education  . Highest education level: Not on file  Occupational History  . Occupation: Retired  Tobacco Use  . Smoking status: Never Smoker  . Smokeless tobacco: Never Used  Substance and Sexual Activity  . Alcohol use: No  . Drug use: No  . Sexual activity: Not Currently  Other Topics Concern  . Not on file  Social History Narrative   Lives alone.   Right-handed.   No caffeine use.   Social Determinants of Health   Financial Resource Strain:   . Difficulty of Paying Living Expenses:   Food Insecurity:   . Worried About Programme researcher, broadcasting/film/video in the Last Year:   . Barista in the Last Year:   Transportation Needs:   . Freight forwarder (Medical):   [name] Lack of Transportation (Non-Medical):   Physical Activity:   . Days of Exercise per Week:   . Minutes of Exercise per Session:   Stress:   . Feeling of Stress :   Social Connections:   . Frequency of Communication with Friends and Family:   . Frequency of Social Gatherings with Friends and Family:   . Attends Religious Services:   . Active Member of Clubs or Organizations:   . Attends Banker Meetings:   [name] Marital Status:   Intimate Partner Violence:   . Fear of Current or  Ex-Partner:   . Emotionally Abused:   [name] Physically Abused:   . Sexually Abused:     Past Surgical History:  Procedure Laterality Date  . NO PAST SURGERIES      Family History  Problem Relation Age of Onset  . Stroke Mother   . Heart attack Father   . Heart Problems Brother     Allergies  Allergen Reactions  . Crestor [Rosuvastatin Calcium] Other (See Comments)    "caused me to go into kidney failure"    Current Outpatient Medications on File Prior to Visit  Medication Sig Dispense Refill  . amLODipine (NORVASC) 5 MG tablet Take 1 tablet (5 mg total) by mouth 2  (two) times daily. 180 tablet 1  . aspirin EC 81 MG EC tablet Take 1 tablet (81 mg total) by mouth daily. 30 tablet 0  . atorvastatin (LIPITOR) 40 MG tablet Take 1 tablet (40 mg total) by mouth daily. 30 tablet 0  . Calcium Carbonate (CALCIUM 600 PO) Take 2 tablets by mouth daily.    . Calcium Polycarbophil (FIBER-CAPS PO) Take 1 capsule by mouth 2 (two) times a day.     . Cinnamon 500 MG capsule Take 1,000 mg by mouth daily.    . fluticasone (FLONASE) 50 MCG/ACT nasal spray Place 2 sprays into both nostrils daily. 16 g 1  . hydrochlorothiazide (MICROZIDE) 12.5 MG capsule Take 12.5 mg by mouth 2 (two) times a week. Monday and Friday    . levocetirizine (XYZAL) 5 MG tablet Take 1 tablet (5 mg total) by mouth every evening. 30 tablet 3  . levothyroxine (SYNTHROID) 50 MCG tablet TAKE 1 TABLET BY MOUTH DAILY (Patient taking differently: Take 50 mcg by mouth daily before breakfast. ) 90 tablet 0  . meclizine (ANTIVERT) 50 MG tablet Take 0.5 tablets (25 mg total) by mouth 3 (three) times daily as needed for dizziness. 20 tablet 0  . MELATONIN PO Take 2-3 tablets by mouth at bedtime as needed (sleep).     . Menaquinone-7 (VITAMIN K2 PO) Take 1 tablet by mouth at bedtime.     . metoprolol tartrate (LOPRESSOR) 50 MG tablet TAKE 1 TABLET(50 MG) BY MOUTH TWICE DAILY (Patient taking differently: Take 50 mg by mouth 2 (two) times daily. ) 180 tablet 1  . Multiple Vitamin (MULTIVITAMIN) tablet Take 1 tablet by mouth daily.    . NON FORMULARY Take 3 tablets by mouth 3 (three) times daily. Magnet    . Omega-3 Fatty Acids (OMEGA 3 PO) Take 2 tablets by mouth every evening.     . pantoprazole (PROTONIX) 40 MG tablet Take 1 tablet (40 mg total) by mouth daily. 30 tablet 1  . Probiotic Product (PROBIOTIC DAILY PO) Take 1 tablet by mouth daily.    [name] telmisartan (MICARDIS) 20 MG tablet Take 1 tablet daily in the evening or at bedtime. (Patient taking differently: Take 20 mg by mouth every evening. ) 90 tablet 0    . vitamin C (ASCORBIC ACID) 500 MG tablet Take 500 mg by mouth every evening.      No current facility-administered medications on file prior to visit.    BP 122/63 (BP Location: Left Arm, Patient Position: Sitting, Cuff Size: Normal)   Pulse 67   Temp 97.7 F (36.5 C) (Temporal)   Resp 16   Ht 4\' 11"  (1.499 m)   Wt 119 lb 3.2 oz (54.1 kg)   SpO2 98%   BMI 24.08 kg/m  Objective:   Physical Exam  General Mental Status- Alert. General Appearance- Not in acute distress.   Skin General: Color- Normal Color. Moisture- Normal Moisture.  Neck Carotid Arteries- Normal color. Moisture- Normal Moisture. No carotid bruits. No JVD.  Chest and Lung Exam Auscultation: Breath Sounds:-Normal.  Cardiovascular Auscultation:Rythm- Regular. Murmurs & Other Heart Sounds:Auscultation of the heart reveals- No Murmurs.  Abdomen Inspection:-Inspeection Normal. Palpation/Percussion:Note:No mass. Palpation and Percussion of the abdomen reveal- Non Tender, Non Distended + BS, no rebound or guarding.    Neurologic Cranial Nerve exam:- CN III-XII intact(No nystagmus), symmetric smile. Drift Test:- No drift. Romberg Exam:- poor balance ambulating. Heal to Toe Gait exam:-Normal. Finger to Nose:- Normal/Intact Strength:- 5/5 equal and symmetric strength both upper and lower extremities.      Assessment & Plan:  For your recent dizziness/vertigo, I did refill your meclizine but also went ahead and put in referral to PT for evaluation/treatment. Epley manuevers.   Your bp is well controlled today. Continue current meds.  Advise follow up with your optometrist for annual vision check.  If you have other associated neurologic signs/symptoms then recommend ED evaluation.  Time spent with patient today was 25  minutes which consisted of chart review, discussing diagnosis, work up, treatment and documentation.  Follow up 3 weeks or as needed

## 2019-12-13 NOTE — Patient Instructions (Addendum)
For your recent dizziness/vertigo, I did refill your meclizine but also went ahead and put in referral to PT for evaluation/treatment. Epley manuevers.   Your bp is well controlled today. Continue current meds.  Advise follow up with your optometrist for annual vision check.  If you have other associated neurologic signs/symptoms then recommend ED evaluation.  Follow up 3 weeks or as needed   How to Perform the Epley Maneuver The Epley maneuver is an exercise that relieves symptoms of vertigo. Vertigo is the feeling that you or your surroundings are moving when they are not. When you feel vertigo, you may feel like the room is spinning and have trouble walking. Dizziness is a little different than vertigo. When you are dizzy, you may feel unsteady or light-headed. You can do this maneuver at home whenever you have symptoms of vertigo. You can do it up to 3 times a day until your symptoms go away. Even though the Epley maneuver may relieve your vertigo for a few weeks, it is possible that your symptoms will return. This maneuver relieves vertigo, but it does not relieve dizziness. What are the risks? If it is done correctly, the Epley maneuver is considered safe. Sometimes it can lead to dizziness or nausea that goes away after a short time. If you develop other symptoms, such as changes in vision, weakness, or numbness, stop doing the maneuver and call your health care provider. How to perform the Epley maneuver 1. Sit on the edge of a bed or table with your back straight and your legs extended or hanging over the edge of the bed or table. 2. Turn your head halfway toward the affected ear or side. 3. Lie backward quickly with your head turned until you are lying flat on your back. You may want to position a pillow under your shoulders. 4. Hold this position for 30 seconds. You may experience an attack of vertigo. This is normal. 5. Turn your head to the opposite direction until your unaffected  ear is facing the floor. 6. Hold this position for 30 seconds. You may experience an attack of vertigo. This is normal. Hold this position until the vertigo stops. 7. Turn your whole body to the same side as your head. Hold for another 30 seconds. 8. Sit back up. You can repeat this exercise up to 3 times a day. Follow these instructions at home:  After doing the Epley maneuver, you can return to your normal activities.  Ask your health care provider if there is anything you should do at home to prevent vertigo. He or she may recommend that you: ? Keep your head raised (elevated) with two or more pillows while you sleep. ? Do not sleep on the side of your affected ear. ? Get up slowly from bed. ? Avoid sudden movements during the day. ? Avoid extreme head movement, like looking up or bending over. Contact a health care provider if:  Your vertigo gets worse.  You have other symptoms, including: ? Nausea. ? Vomiting. ? Headache. Get help right away if:  You have vision changes.  You have a severe or worsening headache or neck pain.  You cannot stop vomiting.  You have new numbness or weakness in any part of your body. Summary  Vertigo is the feeling that you or your surroundings are moving when they are not.  The Epley maneuver is an exercise that relieves symptoms of vertigo.  If the Epley maneuver is done correctly, it is considered safe.  You can do it up to 3 times a day. This information is not intended to replace advice given to you by your health care provider. Make sure you discuss any questions you have with your health care provider. Document Revised: 07/11/2017 Document Reviewed: 06/18/2016 Elsevier Patient Education  2020 [employer].

## 2019-12-14 ENCOUNTER — Telehealth: Payer: Self-pay | Admitting: *Deleted

## 2019-12-14 NOTE — Telephone Encounter (Signed)
Patient has a [employer] appointment on 12/29/2019.  I called [employer] and spoke to [employer]. She states that [id number] and [id number] are valid and billable.  They do not require PA.  Ref# for this call is [id number].

## 2019-12-21 ENCOUNTER — Encounter: Payer: Self-pay | Admitting: Physical Therapy

## 2019-12-21 ENCOUNTER — Ambulatory Visit: Payer: Medicare Other | Attending: Medical | Admitting: Physical Therapy

## 2019-12-21 ENCOUNTER — Other Ambulatory Visit: Payer: Self-pay

## 2019-12-21 VITALS — BP 110/54 | HR 64

## 2019-12-21 DIAGNOSIS — R2681 Unsteadiness on feet: Secondary | ICD-10-CM

## 2019-12-21 DIAGNOSIS — R42 Dizziness and giddiness: Secondary | ICD-10-CM | POA: Diagnosis not present

## 2019-12-21 NOTE — Therapy (Signed)
[facility] [address]  [address] [place], [place], [address] Phone: [phone]   Fax:  [phone]  Physical Therapy Evaluation  Patient Details  Name: Leslie Ward MRN: [id number] Date of Birth: Jul 01, 1931 Referring Provider (PT): [name], [place]   Encounter Date: 12/21/2019  PT End of Session - 12/21/19 1812    Visit Number  1    Number of Visits  12    Date for PT Re-Evaluation  02/01/20    Authorization Type  Medicare & Medicaid    PT Start Time  1528    PT Stop Time  1616    PT Time Calculation (min)  48 min    Activity Tolerance  Patient tolerated treatment well    Behavior During Therapy  [facility] for tasks assessed/performed       Past Medical History:  Diagnosis Date  . Dystonia   . Hypertension   . Thyroid disease     Past Surgical History:  Procedure Laterality Date  . NO PAST SURGERIES      Vitals:   12/21/19 1540  BP: (!) 110/54  Pulse: 64  SpO2: 98%     Subjective Assessment - 12/21/19 1529    Subjective  Patient present with son. Still having dizziness since her hospital admission from 04/20-04/21 for a fall caused by dizziness. Dizziness occurs intermittently with turning in bed, changes in positioning, and quick head turns. Dizziness lasts seconds. Denies nausea, vomiting, photo/phonophobia since hitting her head after her fall. BP has been normal. Dizziness shas been ongoing for 3 weeks but is improving. Does feel off balance when walking- has a walker but has not used it recently.    Patient is accompained by:  Family member   son   Pertinent History  thyroid disease, HTN, DM, cervical dystonia, bladder stimulator    Limitations  House hold activities;Standing;Walking    Diagnostic tests  04/20 CT head/neck: evidence of age-indeterminate lacunar and basal ganglia strokes, no history of CVA on CT; repeat CT on 04/21 unchanged    Patient Stated Goals  get rid of dizziness    Currently  in Pain?  No/denies         [facility] PT Assessment - 12/21/19 1537      Assessment   Medical Diagnosis  Vertigo    Referring Provider (PT)  [name], PA-C    Onset Date/Surgical Date  11/30/19    Hand Dominance  Right    Prior Therapy  yes- neck      Precautions   Precautions  --   bladder sitmulator     Balance Screen   Has the patient fallen in the past 6 months  Yes    How many times?  1   caused by dizziness   Has the patient had a decrease in activity level because of a fear of falling?   No    Is the patient reluctant to leave their home because of a fear of falling?   No      Home Environment   Living Environment  Private residence    Living Arrangements  Alone    Available Help at Discharge  Family    Type of Home  Apartment    Home Access  Level entry      Prior Function   Level of Independence  Independent    Vocation  Retired      IT consultant   Overall Cognitive Status  Within Functional Limits  for tasks assessed      Posture/Postural Control   Posture/Postural Control  Postural limitations    Postural Limitations  Rounded Shoulders;Forward head    Posture Comments  cervical dystonia/tremors at restr      Ambulation/Gait   Assistive device  None    Gait Pattern  Step-to pattern;Step-through pattern;Decreased step length - right;Decreased step length - left;Trunk flexed   guarded and hesitat gait pattern   Ambulation Surface  Level;Indoor    Gait velocity  decreased           Vestibular Assessment - 12/21/19 0001      Oculomotor Exam   Oculomotor Alignment  Normal    Spontaneous  Absent    Gaze-induced   Right beating nystagmus with R gaze;Right beating nystagmus with L gaze;Age appropriate nystagmus at end range    Head shaking Horizontal  Absent    Smooth Pursuits  Saccades   R beating nystagmus to R, L beating to L   Saccades  Hypermetric   B sides with horizontal saccades     Vestibulo-Ocular Reflex   VOR 1 Head Only (x 1 viewing)   horizontal & vertical VOR intact    VOR Cancellation  Corrective saccades   corrective saccades with R and L horizontal directions     Positional Testing   Dix-Hallpike  Dix-Hallpike Right;Dix-Hallpike Left      Dix-Hallpike Right   Dix-Hallpike Right Symptoms  Upbeat, right rotatory nystagmus   ~10 sec dizziness     Dix-Hallpike Left   Dix-Hallpike Left Symptoms  No nystagmus          Objective measurements completed on examination: See above findings.       Vestibular Treatment/Exercise - 12/21/19 0001      Vestibular Treatment/Exercise   Vestibular Treatment Provided  Canalith Repositioning    Canalith Repositioning  Epley Manuever Right       EPLEY MANUEVER RIGHT   Number of Reps   1    Overall Response  Improved Symptoms    Response Details   mild dizziness upon sitting but no dizziness upon standing            PT Education - 12/21/19 1811    Education Details  prognosis, POC, edu on BPPV and post-Epley precautions    Person(s) Educated  Patient    Methods  Explanation;Demonstration;Tactile cues;Verbal cues;Handout    Comprehension  Verbalized understanding;Returned demonstration       PT Short Term Goals - 12/21/19 1822      PT SHORT TERM GOAL #1   Title  Patient to report understanding and compliance with post-Epley instructions.    Time  3    Period  Weeks    Status  New    Target Date  01/11/20        PT Long Term Goals - 12/21/19 1823      PT LONG TERM GOAL #1   Title  Patient to report no dizziness and demonstrate no nystagmus with positional testing.    Time  6    Period  Weeks    Status  New    Target Date  02/01/20      PT LONG TERM GOAL #2   Title  Patient to score <14 sec on TUG testing without AD.    Time  6    Period  Weeks    Status  New    Target Date  02/01/20      PT LONG TERM GOAL #3  Title  Patient to report 100% resolution of dizziness.    Time  6    Period  Weeks    Status  New    Target Date  02/01/20       PT LONG TERM GOAL #4   Title  Patient to score >19/24 on DGI in order to decrease risk of falls.    Time  6    Period  Weeks    Status  New    Target Date  02/01/20             Plan - 12/21/19 1812    Clinical Impression Statement  Patient is an 86y/o F presenting to OPPT with c/o dizziness of 3 weeks duration. Was hospitalized from 04/20-04/21 for a fall caused by dizziness. CT head with no acute findings. Denies nausea, vomiting, photo/phonophobia despite hitting her head when she fell. Dizziness lasts seconds and is worse with rolling in bed and quick head turns. Patient was initially using a walker for ambulation after hospitalization, but now ambulating without AD. Patient today presenting with forward head posture and rounded shoulders. Oculomotor exam revealed R-beating nystagmus with R and L gaze, R beating nystagmus to R and L beating nystagmus to L with smooth pursuits, hypermetric saccadic testing to B horizontal directions, and corrective saccades to B horizontal directions with VOR cancellation. Patient with R upbeating torsional nystagmus with R Gilberto Better, which was treated with R Epley. Patient reported improvement upon standing. D/t patient's limited cervical rotation ROM, repeat canalith respositioning may be required. Educated patient on post-Epley precautions and treatment course for BPPV. Patient reported understanding. Would benefit from skilled PT services 1-2x/week for 6 weeks as needed for dizziness.    Personal Factors and Comorbidities  Age;Comorbidity 3+;Fitness;Past/Current Experience;Time since onset of injury/illness/exacerbation    Comorbidities  thyroid disease, HTN, DM, cervical dystonia, bladder stimulator    Examination-Activity Limitations  Bathing;Bed Mobility;Sleep;Bend;Squat;Stairs;Stand;Toileting;Transfers;Dressing;Hygiene/Grooming;Lift;Reach Overhead;Locomotion Level    Examination-Participation Restrictions  Church;Cleaning;Shop;Driving;Laundry;Meal  Prep    Stability/Clinical Decision Making  Stable/Uncomplicated    Clinical Decision Making  Low    Rehab Potential  Good    PT Frequency  2x / week   1-2x   PT Duration  6 weeks    PT Treatment/Interventions  ADLs/Self Care Home Management;Canalith Repostioning;Cryotherapy;Moist Heat;Balance training;Therapeutic exercise;Therapeutic activities;Functional mobility training;Stair training;Gait training;DME Instruction;Neuromuscular re-education;Patient/family education;Manual techniques;Vestibular    PT Next Visit Plan  reassess R DH    Consulted and Agree with Plan of Care  Patient       Patient will benefit from skilled therapeutic intervention in order to improve the following deficits and impairments:  Abnormal gait, Decreased activity tolerance, Decreased balance, Decreased range of motion, Improper body mechanics, Postural dysfunction, Impaired flexibility, Dizziness  Visit Diagnosis: Dizziness and giddiness  Unsteadiness on feet     Problem List Patient Active Problem List   Diagnosis Date Noted  . Vertigo 12/01/2019  . Cervical dystonia 03/08/2019  . Hyperlipidemia 02/11/2019  . Edema 02/11/2019  . Hypertension 01/19/2019  . Type 2 diabetes mellitus (HCC) 01/19/2019  . Hypothyroidism (acquired) 01/19/2019  . Neck pain 01/19/2019  . Dystonia 01/19/2019  . Adhesive capsulitis of left shoulder 12/22/2018      Anette Guarneri, PT, DPT 12/21/19 6:32 PM   [facility] [address]  [address] [place], [place], [address] Phone: [phone]   Fax:  [phone]  Name: Adah Stoneberg MRN: [id number] Date of Birth: 10/17/1930

## 2019-12-22 ENCOUNTER — Other Ambulatory Visit: Payer: Self-pay | Admitting: Cardiology

## 2019-12-29 ENCOUNTER — Encounter: Payer: Self-pay | Admitting: Neurology

## 2019-12-29 ENCOUNTER — Other Ambulatory Visit: Payer: Self-pay

## 2019-12-29 ENCOUNTER — Ambulatory Visit (INDEPENDENT_AMBULATORY_CARE_PROVIDER_SITE_OTHER): Payer: Medicare Other | Admitting: Neurology

## 2019-12-29 VITALS — BP 124/63 | HR 66 | Ht 59.0 in | Wt 124.0 lb

## 2019-12-29 DIAGNOSIS — G243 Spasmodic torticollis: Secondary | ICD-10-CM | POA: Diagnosis not present

## 2019-12-29 MED ORDER — INCOBOTULINUMTOXINA 100 UNITS IM SOLR
100.0000 [IU] | INTRAMUSCULAR | Status: DC
  Administered 2019-12-29: 100 [IU] via INTRAMUSCULAR

## 2019-12-29 NOTE — Progress Notes (Signed)
**  Xeomin 100 units x 1 vial, NDC 0259-1610-01, Lot [id number], Exp 07/2021, office supply.//mck,rn** 
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
## 2019-12-30 ENCOUNTER — Other Ambulatory Visit: Payer: Self-pay | Admitting: Family

## 2019-12-31 ENCOUNTER — Ambulatory Visit: Payer: Medicare Other | Admitting: Physical Therapy

## 2019-12-31 ENCOUNTER — Other Ambulatory Visit: Payer: Self-pay

## 2019-12-31 ENCOUNTER — Encounter: Payer: Self-pay | Admitting: Physical Therapy

## 2019-12-31 DIAGNOSIS — R42 Dizziness and giddiness: Secondary | ICD-10-CM | POA: Diagnosis not present

## 2019-12-31 DIAGNOSIS — R2681 Unsteadiness on feet: Secondary | ICD-10-CM

## 2019-12-31 NOTE — Therapy (Signed)
[facility] [address]  [place], [place], [address] Phone: [phone]   Fax:  [phone]  Physical Therapy Treatment  Patient Details  Name: Leslie Ward MRN: [id number] Date of Birth: 04-16-1931 Referring Provider (PT): Mackie Pai, [place]   Encounter Date: 12/31/2019  PT End of Session - 12/31/19 0939    Visit Number  2    Number of Visits  12    Date for PT Re-Evaluation  02/01/20    Authorization Type  Medicare & Medicaid    PT Start Time  6568    PT Stop Time  0937    PT Time Calculation (min)  44 min    Activity Tolerance  Patient tolerated treatment well    Behavior During Therapy  [facility] for tasks assessed/performed       Past Medical History:  Diagnosis Date  . Dystonia   . Hypertension   . Thyroid disease     Past Surgical History:  Procedure Laterality Date  . NO PAST SURGERIES      There were no vitals filed for this visit.  Subjective Assessment - 12/31/19 0855    Subjective  Has good and bad days. Still taking Meclizine daily.Still feeling dizzy when she gets out of bed. Notes compliance with post-Epley instructions.    Patient is accompained by:  Family member    Pertinent History  thyroid disease, HTN, DM, cervical dystonia, bladder stimulator    Diagnostic tests  04/20 CT head/neck: evidence of age-indeterminate lacunar and basal ganglia strokes, no history of CVA on CT; repeat CT on 04/21 unchanged    Patient Stated Goals  get rid of dizziness    Currently in Pain?  No/denies              Vestibular Assessment - 12/31/19 0001      Positional Testing   Dix-Hallpike  Dix-Hallpike Right;Dix-Hallpike Left      Dix-Hallpike Right   Dix-Hallpike Right Duration  15   decreased with each trial   Dix-Hallpike Right Symptoms  Upbeat, right rotatory nystagmus   c/o dizziness; x3               Vestibular Treatment/Exercise - 12/31/19 0001      Vestibular  Treatment/Exercise   Vestibular Treatment Provided  Canalith Repositioning    Canalith Repositioning  Semont Procedure Right Posterior       EPLEY MANUEVER RIGHT   Number of Reps   2    Overall Response  Improved Symptoms    Response Details   no dizziness upon sitting      Semont Procedure Right Posterior   Number of Reps   1    Overall Response   Improved Symptoms    Response Details   no dizziness upon sitting            PT Education - 12/31/19 0938    Education Details  review of post-Epley instructions including sleeping position and avoidance of excessive head movements    Person(s) Educated  Patient    Methods  Explanation;Demonstration;Tactile cues;Verbal cues    Comprehension  Verbalized understanding       PT Short Term Goals - 12/31/19 0945      PT SHORT TERM GOAL #1   Title  Patient to report understanding and compliance with post-Epley instructions.    Time  3    Period  Weeks    Status  Partially Met   notes that she  followed instructions for 1 weeks rather than 1 day   Target Date  01/11/20        PT Long Term Goals - 12/31/19 0946      PT LONG TERM GOAL #1   Title  Patient to report no dizziness and demonstrate no nystagmus with positional testing.    Time  6    Period  Weeks    Status  On-going      PT LONG TERM GOAL #2   Title  Patient to score <14 sec on TUG testing without AD.    Time  6    Period  Weeks    Status  On-going      PT LONG TERM GOAL #3   Title  Patient to report 100% resolution of dizziness.    Time  6    Period  Weeks    Status  On-going      PT LONG TERM GOAL #4   Title  Patient to score >19/24 on DGI in order to decrease risk of falls.    Time  6    Period  Weeks    Status  On-going            Plan - 12/31/19 7858    Clinical Impression Statement  Patient arrived to session with report of fluctuating dizziness and continued dizziness when sitting up from bed. Re-assessed R DH which was positive for  dizziness and R upbeating torsional nystagmus. Treated patient with R Semont as patient with limited cervical ROM and unable to reach ideal head position for Epley last session. Patient tolerated this well. Re-tested R DH, which was again positive. Proceeded with R Epley on large plinth and with pillow under upper back for hopeful improvement in positioning. Was able to reach ideal positioning with these modifications, however patient still with positive R DH after this maneuver, thus it was repeated. Patient reported no dizziness at end of session. Was re-educated on post-Epley instructions and avoidance of excessive head movement for the rest of the day. Patient reported understanding and without complaints at end of session.    Comorbidities  thyroid disease, HTN, DM, cervical dystonia, bladder stimulator    PT Treatment/Interventions  ADLs/Self Care Home Management;Canalith Repostioning;Cryotherapy;Moist Heat;Balance training;Therapeutic exercise;Therapeutic activities;Functional mobility training;Stair training;Gait training;DME Instruction;Neuromuscular re-education;Patient/family education;Manual techniques;Vestibular    PT Next Visit Plan  reassess R DH    Consulted and Agree with Plan of Care  Patient       Patient will benefit from skilled therapeutic intervention in order to improve the following deficits and impairments:  Abnormal gait, Decreased activity tolerance, Decreased balance, Decreased range of motion, Improper body mechanics, Postural dysfunction, Impaired flexibility, Dizziness  Visit Diagnosis: Dizziness and giddiness  Unsteadiness on feet     Problem List Patient Active Problem List   Diagnosis Date Noted  . Vertigo 12/01/2019  . Cervical dystonia 03/08/2019  . Hyperlipidemia 02/11/2019  . Edema 02/11/2019  . Hypertension 01/19/2019  . Type 2 diabetes mellitus ([place]) 01/19/2019  . Hypothyroidism (acquired) 01/19/2019  . Neck pain 01/19/2019  . Dystonia 01/19/2019   . Adhesive capsulitis of left shoulder 12/22/2018    Janene Harvey, PT, DPT 12/31/19 9:50 AM    [facility] [address]  [place], [place], [address] Phone: [phone]   Fax:  [phone]  Name: Leslie Ward MRN: [id number] Date of Birth: 03-17-1931

## 2020-01-07 DIAGNOSIS — H02883 Meibomian gland dysfunction of right eye, unspecified eyelid: Secondary | ICD-10-CM

## 2020-01-07 DIAGNOSIS — H02834 Dermatochalasis of left upper eyelid: Secondary | ICD-10-CM | POA: Diagnosis not present

## 2020-01-07 DIAGNOSIS — H35033 Hypertensive retinopathy, bilateral: Secondary | ICD-10-CM | POA: Insufficient documentation

## 2020-01-07 DIAGNOSIS — H5203 Hypermetropia, bilateral: Secondary | ICD-10-CM | POA: Diagnosis not present

## 2020-01-07 DIAGNOSIS — H524 Presbyopia: Secondary | ICD-10-CM | POA: Diagnosis not present

## 2020-01-07 DIAGNOSIS — H0100B Unspecified blepharitis left eye, upper and lower eyelids: Secondary | ICD-10-CM | POA: Diagnosis not present

## 2020-01-07 DIAGNOSIS — H3589 Other specified retinal disorders: Secondary | ICD-10-CM

## 2020-01-07 DIAGNOSIS — H52203 Unspecified astigmatism, bilateral: Secondary | ICD-10-CM | POA: Diagnosis not present

## 2020-01-07 DIAGNOSIS — H0100A Unspecified blepharitis right eye, upper and lower eyelids: Secondary | ICD-10-CM

## 2020-01-07 DIAGNOSIS — H02831 Dermatochalasis of right upper eyelid: Secondary | ICD-10-CM

## 2020-01-07 DIAGNOSIS — H43393 Other vitreous opacities, bilateral: Secondary | ICD-10-CM

## 2020-01-07 HISTORY — DX: Unspecified blepharitis right eye, upper and lower eyelids: H01.00A

## 2020-01-07 HISTORY — DX: Other vitreous opacities, bilateral: H43.393

## 2020-01-07 HISTORY — DX: Meibomian gland dysfunction of right eye, unspecified eyelid: H02.883

## 2020-01-07 HISTORY — DX: Other specified retinal disorders: H35.89

## 2020-01-07 HISTORY — DX: Hypertensive retinopathy, bilateral: H35.033

## 2020-01-07 HISTORY — DX: Dermatochalasis of right upper eyelid: H02.831

## 2020-01-12 ENCOUNTER — Telehealth: Payer: Self-pay | Admitting: *Deleted

## 2020-01-12 NOTE — Telephone Encounter (Signed)
At her last visit with Dr. Dulce Sellar, the patient preferred not to be on a statin. Please hold refill until she sees him.

## 2020-01-12 NOTE — Telephone Encounter (Signed)
Received a fax request for refill on Atorvastatin 40 mg. This is not on her current medication list, please advise if refill is appropriate. Thank you

## 2020-01-17 ENCOUNTER — Ambulatory Visit (HOSPITAL_BASED_OUTPATIENT_CLINIC_OR_DEPARTMENT_OTHER)
Admission: RE | Admit: 2020-01-17 | Discharge: 2020-01-17 | Disposition: A | Discharge status: Home / Self Care | Payer: Medicare Other | Source: Ambulatory Visit | Attending: Internal Medicine | Admitting: Internal Medicine

## 2020-01-17 ENCOUNTER — Other Ambulatory Visit: Payer: Self-pay

## 2020-01-17 ENCOUNTER — Ambulatory Visit (INDEPENDENT_AMBULATORY_CARE_PROVIDER_SITE_OTHER): Payer: Medicare Other | Admitting: Internal Medicine

## 2020-01-17 ENCOUNTER — Encounter: Payer: Self-pay | Admitting: Internal Medicine

## 2020-01-17 VITALS — BP 139/64 | HR 70 | Temp 95.7°F | Resp 18 | Ht 59.0 in | Wt 122.1 lb

## 2020-01-17 DIAGNOSIS — J984 Other disorders of lung: Secondary | ICD-10-CM | POA: Diagnosis not present

## 2020-01-17 DIAGNOSIS — R1310 Dysphagia, unspecified: Secondary | ICD-10-CM | POA: Insufficient documentation

## 2020-01-17 DIAGNOSIS — M542 Cervicalgia: Secondary | ICD-10-CM | POA: Diagnosis not present

## 2020-01-17 IMAGING — DX DG CHEST 2V
2 series · 2 of 2 positions shown · non-contrast
Comparison: [DATE]

CLINICAL DATA: Neck pain, dysphagia after buttocks injection

EXAM:
CHEST - 2 VIEW

[chest pa]
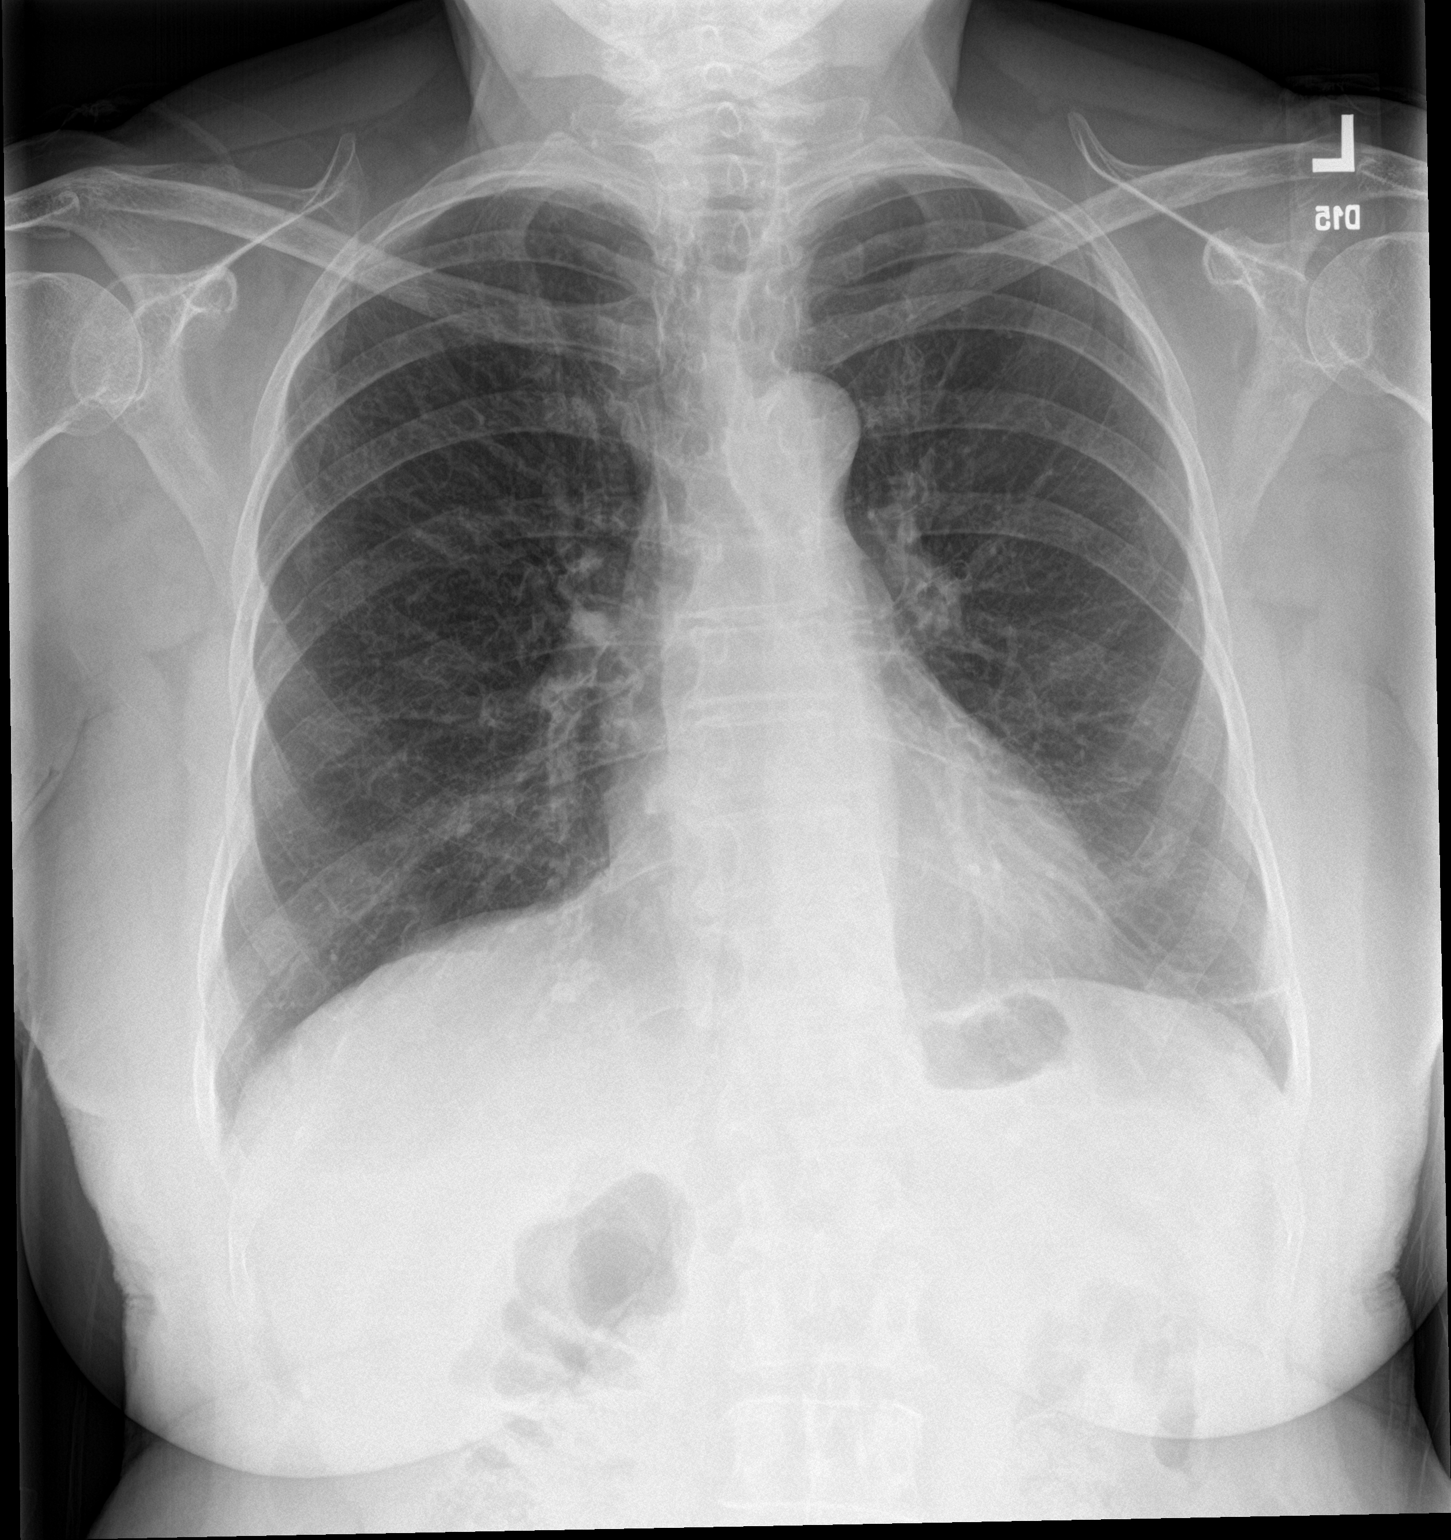

[chest lat]
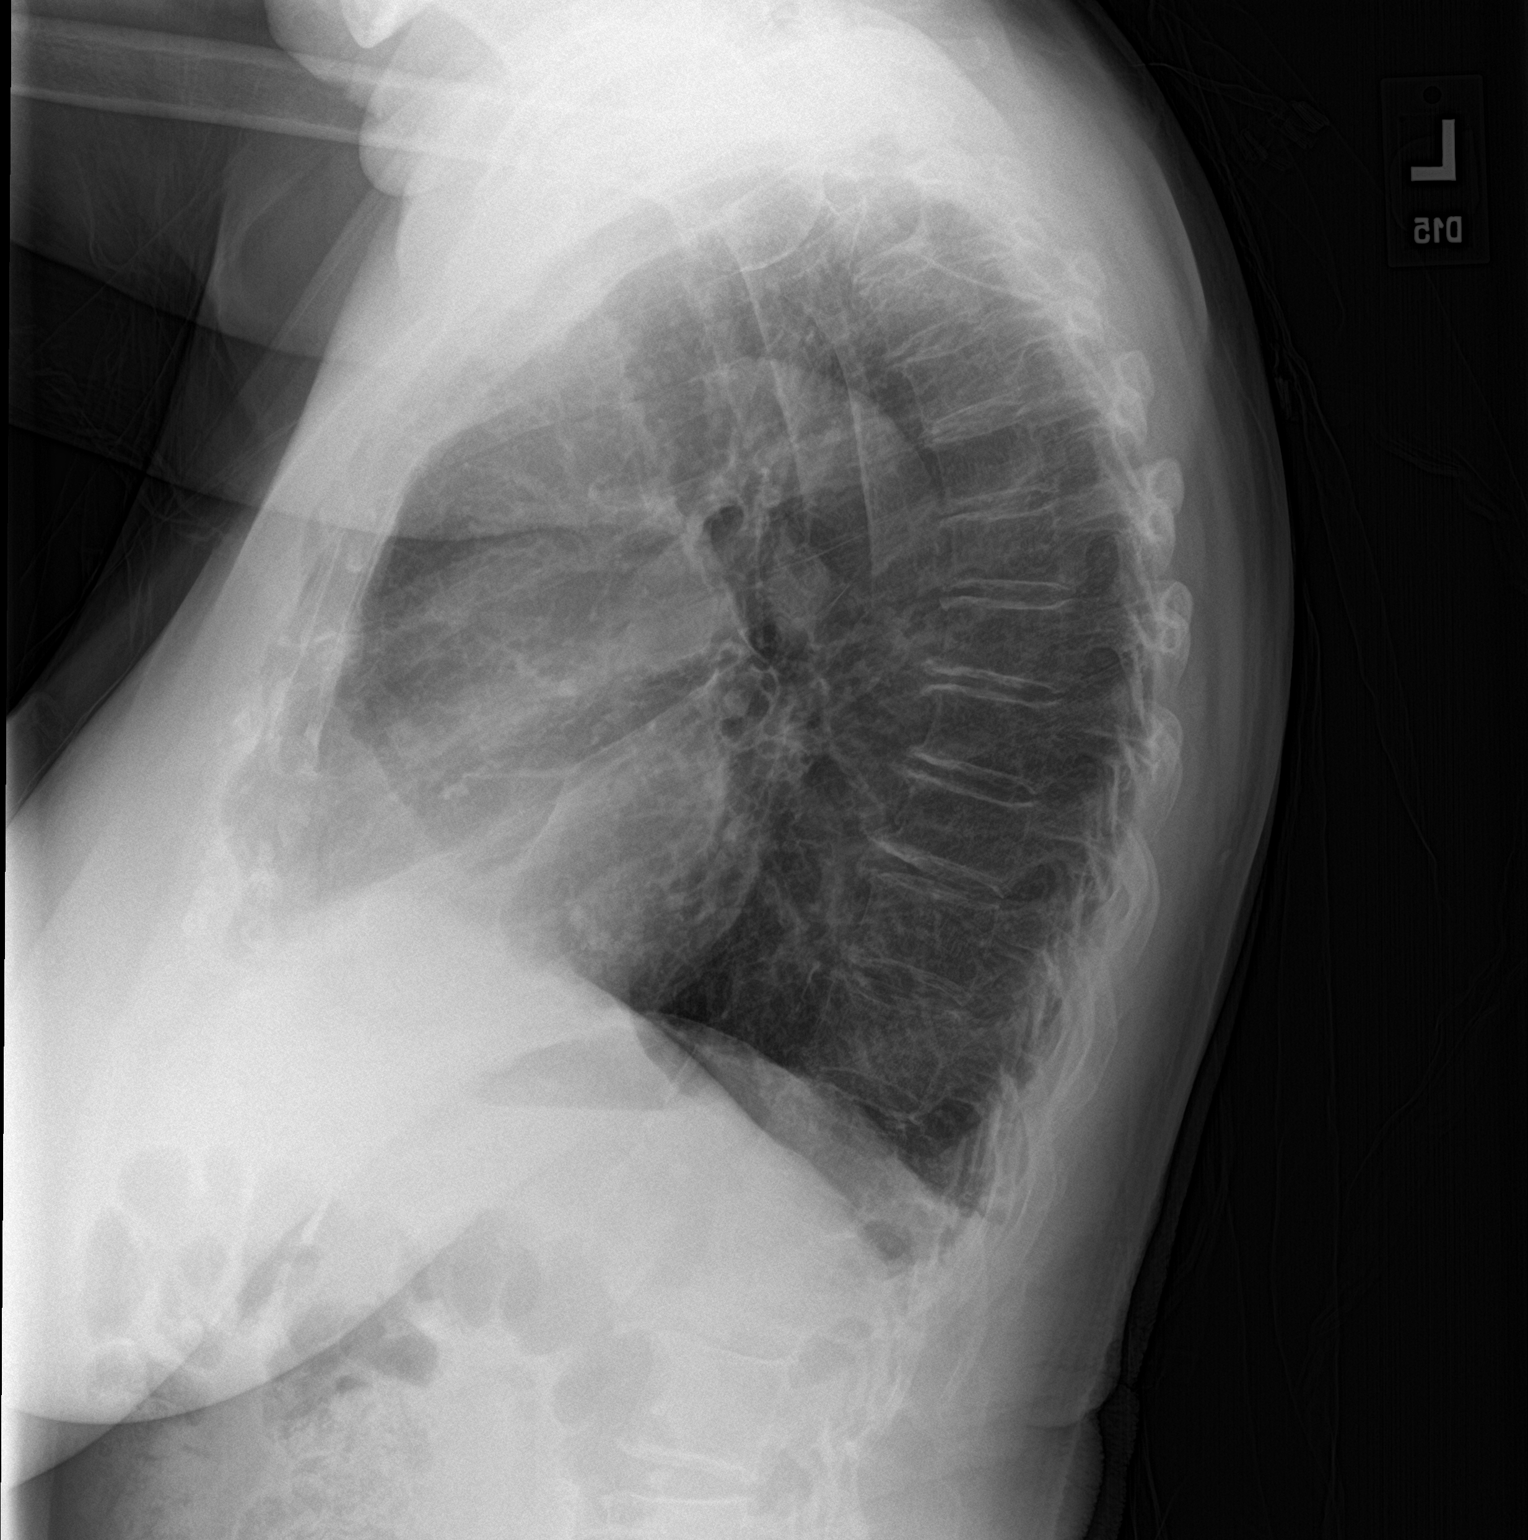

[2 of 2 positions shown; findings below may reference images not displayed]

FINDINGS: Mild linear scarring at the left lung base. Right lung is clear. No
pleural effusion or pneumothorax.

The heart is normal in size.

Mild degenerative changes of the visualized thoracolumbar spine.
IMPRESSION: Normal chest radiographs.

## 2020-01-17 NOTE — Progress Notes (Signed)
Pre visit review using our clinic review tool, if applicable. No additional management support is needed unless otherwise documented below in the visit note. 

## 2020-01-17 NOTE — Patient Instructions (Signed)
Go to the first floor and proceed with a chest x-ray   Dysphagia  Dysphagia is trouble swallowing. This condition occurs when solids and liquids stick in a person's throat on the way down to the stomach, or when food takes longer to get to the stomach than usual. You may have problems swallowing food, liquids, or both. You may also have pain while trying to swallow. It may take you more time and effort to swallow something. What are the causes? This condition may be caused by:  Muscle problems. They may make it difficult for you to move food and liquids through the esophagus, which is the tube that connects your mouth to your stomach.  Blockages. You may have ulcers, scar tissue, or inflammation that blocks the normal passage of food and liquids. Causes of these problems include: ? Acid reflux from your stomach into your esophagus (gastroesophageal reflux). ? Infections. ? Radiation treatment for cancer. ? Medicines taken without enough fluids to wash them down into your stomach.  Stroke. This can affect the nerves and make it difficult to swallow.  Nerve problems. These prevent signals from being sent to the muscles of your esophagus to squeeze (contract) and move what you swallow down to your stomach.  Globus pharyngeus. This is a common problem that involves a feeling like something is stuck in your throat or a sense of trouble with swallowing, even though nothing is wrong with the swallowing passages.  Certain conditions, such as cerebral palsy or Parkinson's disease. What are the signs or symptoms? Common symptoms of this condition include:  A feeling that solids or liquids are stuck in your throat on the way down to the stomach.  Pain while swallowing.  Coughing or gagging while trying to swallow. Other symptoms include:  Food moving back from your stomach to your mouth (regurgitation).  Noises coming from your throat.  Chest discomfort with swallowing.  A feeling of  fullness when swallowing.  Drooling, especially when the throat is blocked.  Heartburn. How is this diagnosed? This condition may be diagnosed by:  Barium X-ray. In this test, you will swallow a white liquid that sticks to the inside of your esophagus. X-ray images are then taken.  Endoscopy. In this test, a flexible telescope is inserted down your throat to look at your esophagus and your stomach.  CT scans and an MRI. How is this treated? Treatment for dysphagia depends on the cause of this condition, such as:  If the dysphagia is caused by acid reflux or infection, medicines may be used. They may include antibiotics and heartburn medicines.  If the dysphagia is caused by problems with the muscles, swallowing therapy may be used to help you strengthen your swallowing muscles. You may have to do specific exercises to strengthen the muscles or stretch them.  If the dysphagia is caused by a blockage or mass, procedures to remove the blockage may be done. You may need surgery and a feeding tube. You may need to make diet changes. Ask your health care provider for specific instructions. Follow these instructions at home: Medicines  Take over-the-counter and prescription medicines only as told by your health care provider.  If you were prescribed an antibiotic medicine, take it as told by your health care provider. Do not stop taking the antibiotic even if you start to feel better. Eating and drinking   Follow any diet changes as told by your health care provider.  Work with a diet and nutrition specialist (dietitian) to create  an eating plan that will help you get the nutrients you need in order to stay healthy.  Eat soft foods that are easier to swallow.  Cut your food into small pieces and eat slowly. Take small bites.  Eat and drink only when you are sitting upright.  Do not drink alcohol or caffeine. If you need help quitting, ask your health care provider. General  instructions  Check your weight every day to make sure you are not losing weight.  Do not use any products that contain nicotine or tobacco, such as cigarettes, e-cigarettes, and chewing tobacco. If you need help quitting, ask your health care provider.  Keep all follow-up visits as told by your health care provider. This is important. Contact a health care provider if you:  Lose weight because you cannot swallow.  Cough when you drink liquids.  Cough up partially digested food. Get help right away if you:  Cannot swallow your saliva.  Have shortness of breath, a fever, or both.  Have a hoarse voice and also have trouble swallowing. Summary  Dysphagia is trouble swallowing. This condition occurs when solids and liquids stick in a person's throat on the way down to the stomach. You may cough or gag while trying to swallow.  Dysphagia has many possible causes.  Treatment for dysphagia depends on the cause of the condition.  Keep all follow-up visits as told by your health care provider. This is important. This information is not intended to replace advice given to you by your health care provider. Make sure you discuss any questions you have with your health care provider. Document Revised: 12/23/2018 Document Reviewed: 12/23/2018 Elsevier Patient Education  [place].

## 2020-01-17 NOTE — Progress Notes (Signed)
Subjective:    Patient ID: Leslie Ward, female    DOB: 1930/11/22, 86 y.o.   MRN: [id number]  DOS:  01/17/2020 Type of visit - description: Acute, here with her son.  The patient had a neck local injection with Xeomin for dystonia. Shortly after developed severe nuchal pain associated with difficulty opening her mouth, due mostly to jaw muscle weakness. She also had some difficulty swallowing and choked at times when having food or pills.  All symptoms are somewhat better in the last 2 to 3 days.  Review of Systems Denies fever chills or weight loss No recent URI type of symptoms other than left-sided nasal congestion for which she is taking Flonase. Denies GERD, no abdominal pain. No chest pain. Denies headaches, diplopia, slurred speech or motor deficit She has chronic on and off dizziness.   Past Medical History:  Diagnosis Date  . Dystonia   . Hypertension   . Thyroid disease     Past Surgical History:  Procedure Laterality Date  . NO PAST SURGERIES      Allergies as of 01/17/2020      Reactions   Crestor [rosuvastatin Calcium] Other (See Comments)   "caused me to go into kidney failure"      Medication List       Accurate as of January 17, 2020 11:59 PM. If you have any questions, ask your nurse or doctor.        amLODipine 5 MG tablet Commonly known as: NORVASC TAKE 1 TABLET(5 MG) BY MOUTH TWICE DAILY   aspirin 81 MG EC tablet Take 1 tablet (81 mg total) by mouth daily.   CALCIUM 600 PO Take 2 tablets by mouth daily.   Cinnamon 500 MG capsule Take 1,000 mg by mouth daily.   FIBER-CAPS PO Take 1 capsule by mouth 2 (two) times a day.   hydrochlorothiazide 12.5 MG capsule Commonly known as: MICROZIDE Take 12.5 mg by mouth 2 (two) times a week. Monday and Friday   levothyroxine 50 MCG tablet Commonly known as: SYNTHROID TAKE 1 TABLET BY MOUTH DAILY What changed: when to take this   meclizine 12.5 MG tablet Commonly known as: ANTIVERT Take 1  tablet (12.5 mg total) by mouth 3 (three) times daily as needed for dizziness.   MELATONIN PO Take 2-3 tablets by mouth at bedtime as needed (sleep).   metoprolol tartrate 50 MG tablet Commonly known as: LOPRESSOR TAKE 1 TABLET(50 MG) BY MOUTH TWICE DAILY What changed: See the new instructions.   multivitamin tablet Take 1 tablet by mouth daily.   NON FORMULARY Take 3 tablets by mouth 3 (three) times daily. [place] [facility]   OMEGA 3 PO Take 2 tablets by mouth every evening.   pantoprazole 40 MG tablet Commonly known as: Protonix Take 1 tablet (40 mg total) by mouth daily.   PROBIOTIC DAILY PO Take 1 tablet by mouth daily.   telmisartan 20 MG tablet Commonly known as: MICARDIS TAKE 1 TABLET BY MOUTH DAILY WITH EVENING MEAL OR AT BEDTIME   vitamin C 500 MG tablet Commonly known as: ASCORBIC ACID Take 500 mg by mouth every evening.   VITAMIN K2 PO Take 1 tablet by mouth at bedtime.          Objective:   Physical Exam BP 139/64 (BP Location: Right Arm, Patient Position: Sitting, Cuff Size: Small)   Pulse 70   Temp (!) 95.7 F (35.4 C) (Temporal)   Resp 18   Ht 4\' 11"  (1.499 m)  Wt 122 lb 2 oz (55.4 kg)   SpO2 99%   BMI 24.67 kg/m  General:   Well developed, NAD, BMI noted. HEENT:  Normocephalic . Face symmetric, atraumatic Neck: Symmetric, no lymphadenopathy or mass.  No TTP at the cervical spine Lungs:  CTA B Normal respiratory effort, no intercostal retractions, no accessory muscle use. Heart: RRR,  no murmur.  Lower extremities: no pretibial edema bilaterally  Skin: Not pale. Not jaundice Neurologic:  alert & oriented X3.  Speech normal, gait appropriate for age and unassisted.  EOMI Motor symmetric, face symmetric, tongue midline. Mouth opening slightly limited but oral membranes normal. No drooling. Psych--  Cognition and judgment appear intact.  Cooperative with normal attention span and concentration.  Behavior appropriate. No anxious or  depressed appearing.      Assessment     86 year old female, PMH includes HTN, Hypothyroidism, DM, high cholesterol, chronic back pain, chronic dizziness.  Dysphagia, neck pain: 86 year old female, developed severe neck pain and dysphagia shortly after neck Botox injection on 12/29/2019.  Overall symptoms are decreasing. No history of tobacco or EtOH use. Suspect side effect from Botox. Plan: Chest x-ray Situation discussed with neurology via message: In theory, there is a potential of distance spread of botulism toxin. If her swallowing function returned back to normal by August, then  symptoms are likely to be a side effect from the botulism toxin, thus plan is to monitor symptoms, if they do not continue to resolve patient will let me know.  (Discussed with the patient's son over the phone after she left the office) Chronic dizziness: Still has episodes on and off   This visit occurred during the SARS-CoV-2 public health emergency.  Safety protocols were in place, including screening questions prior to the visit, additional usage of staff PPE, and extensive cleaning of exam room while observing appropriate contact time as indicated for disinfecting solutions.

## 2020-01-25 ENCOUNTER — Ambulatory Visit: Payer: Medicare Other | Attending: Medical | Admitting: Physical Therapy

## 2020-01-25 ENCOUNTER — Encounter: Payer: Self-pay | Admitting: Physical Therapy

## 2020-01-25 ENCOUNTER — Other Ambulatory Visit: Payer: Self-pay

## 2020-01-25 DIAGNOSIS — R42 Dizziness and giddiness: Secondary | ICD-10-CM

## 2020-01-25 DIAGNOSIS — R2681 Unsteadiness on feet: Secondary | ICD-10-CM

## 2020-01-25 NOTE — Therapy (Signed)
[facility] [address]  [place], [place], [address] Phone: [phone]   Fax:  [phone]  Physical Therapy Treatment  Patient Details  Name: Leslie Ward MRN: [id number] Date of Birth: 04-Sep-1930 Referring Provider (PT): Mackie Pai, [place]   Encounter Date: 01/25/2020   PT End of Session - 01/25/20 1542    Visit Number 3    Number of Visits 11    Date for PT Re-Evaluation 02/22/20    Authorization Type Medicare & Medicaid    PT Start Time 1444    PT Stop Time 1537    PT Time Calculation (min) 53 min    Equipment Utilized During Treatment Gait belt    Activity Tolerance Patient tolerated treatment well    Behavior During Therapy [facility] for tasks assessed/performed           Past Medical History:  Diagnosis Date  . Dystonia   . Hypertension   . Thyroid disease     Past Surgical History:  Procedure Laterality Date  . NO PAST SURGERIES      There were no vitals filed for this visit.   Subjective Assessment - 01/25/20 1444    Subjective Doing a little better. Laying on either side and on her back still giver her dizziness. Has been having neck pain and stiffness for the past month. Sees her neurologist for dystonia next month.    Pertinent History thyroid disease, HTN, DM, cervical dystonia, bladder stimulator    Diagnostic tests 04/20 CT head/neck: evidence of age-indeterminate lacunar and basal ganglia strokes, no history of CVA on CT; repeat CT on 04/21 unchanged    Patient Stated Goals get rid of dizziness    Currently in Pain? Yes    Pain Score 7     Pain Location Neck    Pain Orientation Right;Left;Posterior    Pain Descriptors / Indicators Constant    Pain Type Chronic pain              [facility] PT Assessment - 01/25/20 0001      Assessment   Medical Diagnosis Vertigo    Referring Provider (PT) Mackie Pai, PA-C    Onset Date/Surgical Date 11/30/19      Standardized Balance  Assessment   Standardized Balance Assessment Timed Up and Go Test;Dynamic Gait Index      Dynamic Gait Index   Level Surface Normal    Change in Gait Speed Mild Impairment    Gait with Horizontal Head Turns Normal   cervical ROM severely limited   Gait with Vertical Head Turns Normal   cervical ROM severely limited   Gait and Pivot Turn Mild Impairment    Step Over Obstacle Mild Impairment    Step Around Obstacles Mild Impairment    Steps Moderate Impairment    Total Score 18      Timed Up and Go Test   Normal TUG (seconds) 10.93   no AD              Vestibular Assessment - 01/25/20 0001      Positional Testing   Dix-Hallpike Dix-Hallpike Right;Dix-Hallpike Left      Dix-Hallpike Right   Dix-Hallpike Right Duration 10    Dix-Hallpike Right Symptoms No nystagmus;Upbeat, right rotatory nystagmus   1st trial no dizziness, 2nd & 3rd trial dizzy & nystagmus     Dix-Hallpike Left   Dix-Hallpike Left Duration 15    Dix-Hallpike Left Symptoms Upbeat, left rotatory nystagmus  mild nystagmus, no dizziness                    Vestibular Treatment/Exercise - 01/25/20 0001      Vestibular Treatment/Exercise   Vestibular Treatment Provided Canalith Repositioning    Canalith Repositioning Epley Manuever Right;Epley Manuever Left       EPLEY MANUEVER RIGHT   Number of Reps  3    Overall Response Improved Symptoms    Response Details  no dizziness upon sitting       EPLEY MANUEVER LEFT   Number of Reps  1    Overall Response  Improved Symptoms     RESPONSE DETAILS LEFT dizziness and R upbeat rotary nystagmus upon R head turn                 PT Education - 01/25/20 1542    Education Details review of post-Epley instructions to per followed for 1 night    Person(s) Educated Patient    Methods Explanation;Demonstration;Tactile cues    Comprehension Verbalized understanding            PT Short Term Goals - 01/25/20 1543      PT SHORT TERM GOAL #1    Title Patient to report understanding and compliance with post-Epley instructions.    Time 3    Period Weeks    Status Achieved   notes that she followed instructions for 1 weeks rather than 1 day   Target Date 01/11/20             PT Long Term Goals - 01/25/20 1544      PT LONG TERM GOAL #1   Title Patient to report no dizziness and demonstrate no nystagmus with positional testing.    Time 4    Period Weeks    Status On-going   ongoing dizziness and nystagmus with R [facility]   Target Date 02/22/20      PT LONG TERM GOAL #2   Title Patient to score <14 sec on TUG testing without AD.    Time 6    Period Weeks    Status Achieved   10.93 sec     PT LONG TERM GOAL #3   Title Patient to report 100% resolution of dizziness.    Time 4    Period Weeks    Status On-going   reporting 20% improvement   Target Date 02/22/20      PT LONG TERM GOAL #4   Title Patient to score >19/24 on DGI in order to decrease risk of falls.    Time 4    Period Weeks    Status On-going   18/24   Target Date 02/22/20                 Plan - 01/25/20 1545    Clinical Impression Statement Patient reports 20% improvement in dizziness since initial eval. Notes that dizziness still remains when laying on either side or in supine. Also has been having neck pain and stiffness for the past month and has a f/u with Neurology for cervical dystonia next month. Patient has met TUG goal today, indicating decreased risk of falls according to patient's gait speed. However, patient did demonstrate an increased risk of falls according to DGI d/t imbalance and difficulty with stairs, pivot turns, and stepping over obstacles. Re-assessed R DH which was initially negative for dizziness. Proceeded with L DH which was positive for mild low amplitude L up beating torsional nystagmus. Treated  patient with L Epley and upon R head turn during this maneuver, patient demonstrated R upbeat torsional nystagmus, indicating that the R  ear is still implicated. Patient tolerated 2 rounds of R DH followed by R Epley maneuver d/t presence of R up beating torsional nystagmus and dizziness. Noted no dizziness upon sitting from these maneuvers. Post-epley instructions were reviewed- patient reported understanding. Patient is still struggling with positional dizziness and would benefit from additional skilled PT services 1-2x/week for 4 weeks to address remaining goals.    Comorbidities thyroid disease, HTN, DM, cervical dystonia, bladder stimulator    PT Frequency Other (comment)   1-2x   PT Duration 4 weeks    PT Treatment/Interventions ADLs/Self Care Home Management;Canalith Repostioning;Cryotherapy;Moist Heat;Balance training;Therapeutic exercise;Therapeutic activities;Functional mobility training;Stair training;Gait training;DME Instruction;Neuromuscular re-education;Patient/family education;Manual techniques;Vestibular    PT Next Visit Plan reassess R DH    Consulted and Agree with Plan of Care Patient           Patient will benefit from skilled therapeutic intervention in order to improve the following deficits and impairments:  Abnormal gait, Decreased activity tolerance, Decreased balance, Decreased range of motion, Improper body mechanics, Postural dysfunction, Impaired flexibility, Dizziness  Visit Diagnosis: Dizziness and giddiness  Unsteadiness on feet     Problem List Patient Active Problem List   Diagnosis Date Noted  . Vertigo 12/01/2019  . Cervical dystonia 03/08/2019  . Hyperlipidemia 02/11/2019  . Edema 02/11/2019  . Hypertension 01/19/2019  . Type 2 diabetes mellitus ([place]) 01/19/2019  . Hypothyroidism (acquired) 01/19/2019  . Neck pain 01/19/2019  . Dystonia 01/19/2019  . Adhesive capsulitis of left shoulder 12/22/2018     Janene Harvey, PT, DPT 01/25/20 3:54 PM   [facility] [address]  [address] [place], [place],  [address] Phone: [phone]   Fax:  [phone]  Name: Leslie Ward MRN: [id number] Date of Birth: [birth year]

## 2020-01-29 ENCOUNTER — Other Ambulatory Visit: Payer: Self-pay | Admitting: Medical

## 2020-01-30 NOTE — Progress Notes (Signed)
Cardiology Office Note:    Date:  01/31/2020   ID:  Leslie Ward, DOB 1930/09/16, MRN [id number]  PCP:  [name], PA-C  Cardiologist:  Norman Herrlich, MD    Referring MD: [name], PA-C    ASSESSMENT:    1. Essential hypertension   2. Hyperlipidemia, unspecified hyperlipidemia type   3. Acquired hypothyroidism    PLAN:    In order of problems listed above:  1. Stable BP is at target she takes a reasonable combination of thiazide diuretic beta-blocker calcium channel blocker and telmisartan for resistant hypertension check renal function regarding GFR and hypokalemia continue the same 2. Poorly controlled she has declined lipid-lowering treatment 3. Continue her levothyroxine TSH is in range    Next appointment: 6 months   Medication Adjustments/Labs and Tests Ordered: Current medicines are reviewed at length with the patient today.  Concerns regarding medicines are outlined above.  Orders Placed This Encounter  Procedures  . TSH  . Basic metabolic panel   No orders of the defined types were placed in this encounter.   Chief Complaint  Patient presents with  . Follow-up  . Hypertension    History of Present Illness:    Leslie Ward is a 86 y.o. female with a hx of  hypertension hyperlipidemia and type 2 diabetes. Unfortunately she is statin intolerant and we deferred treatment with other lipid-lowering medications at her request.  She was last seen 07/28/2019. Compliance with diet, lifestyle and medications: Yes  Her previous visit was virtual. In general she is doing better she had one episode of vertigo not as severe as the previous and is not having headache shortness of breath chest pain palpitation or syncope. She has a little bit of pedal edema likely related to venous insufficiency likely related to venous insufficiency.  Her blood pressures been very difficult to control and I would not alter her calcium channel blocker.  Last visit she  decided not to accept lipid-lowering treatment because of side effect Past Medical History:  Diagnosis Date  . Dystonia   . Hypertension   . Thyroid disease     Past Surgical History:  Procedure Laterality Date  . NO PAST SURGERIES      Current Medications: Current Meds  Medication Sig  . amLODipine (NORVASC) 5 MG tablet TAKE 1 TABLET(5 MG) BY MOUTH TWICE DAILY  . aspirin EC 81 MG EC tablet Take 1 tablet (81 mg total) by mouth daily.  . Calcium Carbonate (CALCIUM 600 PO) Take 2 tablets by mouth daily.  . Calcium Polycarbophil (FIBER-CAPS PO) Take 1 capsule by mouth 2 (two) times a day.   . Cinnamon 500 MG capsule Take 1,000 mg by mouth daily.  . hydrochlorothiazide (MICROZIDE) 12.5 MG capsule Take 12.5 mg by mouth 2 (two) times a week. Monday and Friday  . levothyroxine (SYNTHROID) 50 MCG tablet TAKE 1 TABLET BY MOUTH DAILY (Patient taking differently: Take 50 mcg by mouth daily before breakfast. )  . meclizine (ANTIVERT) 12.5 MG tablet Take 1 tablet (12.5 mg total) by mouth 3 (three) times daily as needed for dizziness.  [name] MELATONIN PO Take 2-3 tablets by mouth at bedtime as needed (sleep).   . Menaquinone-7 (VITAMIN K2 PO) Take 1 tablet by mouth at bedtime.   . metoprolol tartrate (LOPRESSOR) 50 MG tablet TAKE 1 TABLET(50 MG) BY MOUTH TWICE DAILY (Patient taking differently: Take 50 mg by mouth 2 (two) times daily. )  . Multiple Vitamin (MULTIVITAMIN) tablet Take 1 tablet by mouth daily.  [name]  NON FORMULARY Take 3 tablets by mouth 3 (three) times daily. [place]  . Omega-3 Fatty Acids (OMEGA 3 PO) Take 2 tablets by mouth every evening.   . Probiotic Product (PROBIOTIC DAILY PO) Take 1 tablet by mouth daily.  [name] telmisartan (MICARDIS) 20 MG tablet TAKE 1 TABLET BY MOUTH DAILY WITH EVENING MEAL OR AT BEDTIME  . vitamin C (ASCORBIC ACID) 500 MG tablet Take 500 mg by mouth every evening.    Current Facility-Administered Medications for the 01/31/20 encounter (Office Visit) with  Richardo Priest, MD  Medication  . incobotulinumtoxinA (XEOMIN) 100 units injection 100 Units     Allergies:   Crestor [rosuvastatin calcium]   Social History   Socioeconomic History  . Marital status: Widowed    Spouse name: Not on file  . Number of children: 1  . Years of education: 6 or 7 years of education  . Highest education level: Not on file  Occupational History  . Occupation: Retired  Tobacco Use  . Smoking status: Never Smoker  . Smokeless tobacco: Never Used  Vaping Use  . Vaping Use: Never used  Substance and Sexual Activity  . Alcohol use: No  . Drug use: No  . Sexual activity: Not Currently  Other Topics Concern  . Not on file  Social History Narrative   Lives alone.   Right-handed.   No caffeine use.   Social Determinants of Health   Financial Resource Strain:   . Difficulty of Paying Living Expenses:   Food Insecurity:   . Worried About Charity fundraiser in the Last Year:   . Arboriculturist in the Last Year:   Transportation Needs:   . Film/video editor (Medical):   [name] Lack of Transportation (Non-Medical):   Physical Activity:   . Days of Exercise per Week:   . Minutes of Exercise per Session:   Stress:   . Feeling of Stress :   Social Connections:   . Frequency of Communication with Friends and Family:   . Frequency of Social Gatherings with Friends and Family:   . Attends Religious Services:   . Active Member of Clubs or Organizations:   . Attends Archivist Meetings:   [name] Marital Status:      Family History: The patient's family history includes Heart Problems in her brother; Heart attack in her father; Stroke in her mother. ROS:   Please see the history of present illness.    All other systems reviewed and are negative.  EKGs/Labs/Other Studies Reviewed:    The following studies were reviewed today: Recent Labs: 02/11/2019: NT-Pro BNP 199 05/11/2019: ALT 11 12/01/2019: B Natriuretic Peptide 76.7; BUN 15; Creatinine,  Ser 0.93; Hemoglobin 13.4; Magnesium 2.1; Platelets 269; Potassium 3.5; Sodium 141; TSH 3.999  Recent Lipid Panel    Component Value Date/Time   CHOL 250 (H) 12/01/2019 0344   CHOL 279 (H) 05/11/2019 1007   TRIG 85 12/01/2019 0344   HDL 62 12/01/2019 0344   HDL 64 05/11/2019 1007   CHOLHDL 4.0 12/01/2019 0344   VLDL 17 12/01/2019 0344   LDLCALC 171 (H) 12/01/2019 0344   LDLCALC 187 (H) 05/11/2019 1007    Physical Exam:    VS:  BP (!) 144/60   Pulse 76   Ht 4\' 11"  (1.499 m)   Wt 122 lb (55.3 kg)   SpO2 97%   BMI 24.64 kg/m     Wt Readings from Last 3 Encounters:  01/31/20 122 lb (  55.3 kg)  01/17/20 122 lb 2 oz (55.4 kg)  12/29/19 124 lb (56.2 kg)   Repeat GEN:  Well nourished, well developed in no acute distress HEENT: Normal NECK: No JVD; No carotid bruits LYMPHATICS: No lymphadenopathy CARDIAC: RRR, no murmurs, rubs, gallops RESPIRATORY:  Clear to auscultation without rales, wheezing or rhonchi  ABDOMEN: Soft, non-tender, non-distended MUSCULOSKELETAL:  No edema; No deformity  SKIN: Warm and dry NEUROLOGIC:  Alert and oriented x 3 PSYCHIATRIC:  Normal affect    Signed, Norman Herrlich, MD  01/31/2020 9:05 AM    [facility]

## 2020-01-31 ENCOUNTER — Other Ambulatory Visit: Payer: Self-pay

## 2020-01-31 ENCOUNTER — Encounter: Payer: Self-pay | Admitting: Cardiology

## 2020-01-31 ENCOUNTER — Ambulatory Visit (INDEPENDENT_AMBULATORY_CARE_PROVIDER_SITE_OTHER): Payer: Medicare Other | Admitting: Cardiology

## 2020-01-31 VITALS — BP 144/60 | HR 76 | Ht 59.0 in | Wt 122.0 lb

## 2020-01-31 DIAGNOSIS — E039 Hypothyroidism, unspecified: Secondary | ICD-10-CM

## 2020-01-31 DIAGNOSIS — N3281 Overactive bladder: Secondary | ICD-10-CM | POA: Diagnosis not present

## 2020-01-31 DIAGNOSIS — I10 Essential (primary) hypertension: Secondary | ICD-10-CM

## 2020-01-31 DIAGNOSIS — E785 Hyperlipidemia, unspecified: Secondary | ICD-10-CM

## 2020-01-31 NOTE — Patient Instructions (Signed)
Medication Instructions:  Your physician recommends that you continue on your current medications as directed. Please refer to the Current Medication list given to you today.  *If you need a refill on your cardiac medications before your next appointment, please call your pharmacy*   Lab Work: Your physician recommends that you return for lab work in: TODAY BMP, TSH If you have labs (blood work) drawn today and your tests are completely normal, you will receive your results only by: [name] MyChart Message (if you have MyChart) OR . A paper copy in the mail If you have any lab test that is abnormal or we need to change your treatment, we will call you to review the results.   Testing/Procedures: None   Follow-Up: At [facility], you and your health needs are our priority.  As part of our continuing mission to provide you with exceptional heart care, we have created designated Provider Care Teams.  These Care Teams include your primary Cardiologist (physician) and Advanced Practice Providers (APPs -  Physician Assistants and Nurse Practitioners) who all work together to provide you with the care you need, when you need it.  We recommend signing up for the patient portal called "MyChart".  Sign up information is provided on this After Visit Summary.  MyChart is used to connect with patients for Virtual Visits (Telemedicine).  Patients are able to view lab/test results, encounter notes, upcoming appointments, etc.  Non-urgent messages can be sent to your provider as well.   To learn more about what you can do with MyChart, go to [email or web address].    Your next appointment:   6 month(s)  The format for your next appointment:   In Person  Provider:   Norman Herrlich, MD   Other Instructions

## 2020-02-01 ENCOUNTER — Other Ambulatory Visit: Payer: Self-pay | Admitting: Medical

## 2020-02-01 LAB — TSH: TSH: 3.64 u[IU]/mL (ref 0.450–4.500)

## 2020-02-01 LAB — BASIC METABOLIC PANEL
BUN/Creatinine Ratio: 21 (ref 12–28)
BUN: 19 mg/dL (ref 8–27)
CO2: 23 mmol/L (ref 20–29)
Calcium: 9.7 mg/dL (ref 8.7–10.3)
Chloride: 103 mmol/L (ref 96–106)
Creatinine, Ser: 0.91 mg/dL (ref 0.57–1.00)
GFR calc Af Amer: 66 mL/min/{1.73_m2} (ref >59)
GFR calc non Af Amer: 57 mL/min/{1.73_m2} — ABNORMAL LOW (ref >59)
Glucose: 143 mg/dL — ABNORMAL HIGH (ref 65–99)
Potassium: 4 mmol/L (ref 3.5–5.2)
Sodium: 141 mmol/L (ref 134–144)

## 2020-02-02 ENCOUNTER — Ambulatory Visit: Payer: Medicare Other | Admitting: Physical Therapy

## 2020-02-02 ENCOUNTER — Other Ambulatory Visit: Payer: Self-pay

## 2020-02-02 ENCOUNTER — Encounter: Payer: Self-pay | Admitting: Physical Therapy

## 2020-02-02 DIAGNOSIS — R2681 Unsteadiness on feet: Secondary | ICD-10-CM

## 2020-02-02 DIAGNOSIS — R42 Dizziness and giddiness: Secondary | ICD-10-CM | POA: Diagnosis not present

## 2020-02-02 NOTE — Therapy (Signed)
[facility] [address]  [address] [place], [place], [address] Phone: [phone]   Fax:  [phone]  Physical Therapy Treatment  Patient Details  Name: Leslie Ward MRN: [id number] Date of Birth: 1931/02/15 Referring Provider (PT): [name], [place]   Encounter Date: 02/02/2020   PT End of Session - 02/02/20 1751    Visit Number 4    Number of Visits 11    Date for PT Re-Evaluation 02/22/20    Authorization Type Medicare & Medicaid    PT Start Time 1659    PT Stop Time 1749    PT Time Calculation (min) 50 min    Activity Tolerance Patient tolerated treatment well    Behavior During Therapy [facility] for tasks assessed/performed           Past Medical History:  Diagnosis Date  . Dystonia   . Hypertension   . Thyroid disease     Past Surgical History:  Procedure Laterality Date  . NO PAST SURGERIES      There were no vitals filed for this visit.   Subjective Assessment - 02/02/20 1659    Subjective Neck pain is getting better. Feels like her dizziness is a little better- feels that duration of dizziness is decreasing when getting up from bed and reading in bed.    Pertinent History thyroid disease, HTN, DM, cervical dystonia, bladder stimulator    Diagnostic tests 04/20 CT head/neck: evidence of age-indeterminate lacunar and basal ganglia strokes, no history of CVA on CT; repeat CT on 04/21 unchanged    Patient Stated Goals get rid of dizziness    Currently in Pain? Yes    Pain Score 6     Pain Location Neck    Pain Orientation Right;Left;Posterior    Pain Descriptors / Indicators Constant    Pain Type Chronic pain                   Vestibular Assessment - 02/02/20 0001      Positional Testing   Dix-Hallpike Dix-Hallpike Right      Dix-Hallpike Right   Dix-Hallpike Right Symptoms No nystagmus;Upbeat, right rotatory nystagmus   x2, dizziness on 3rd & 4th trial     Dix-Hallpike Left    Dix-Hallpike Left Symptoms No nystagmus   1x     Horizontal Canal Right   Horizontal Canal Right Symptoms Normal      Horizontal Canal Left   Horizontal Canal Left Symptoms Normal                     Vestibular Treatment/Exercise - 02/02/20 0001      Vestibular Treatment/Exercise   Vestibular Treatment Provided Habituation    Habituation Exercises Brandt Daroff       EPLEY MANUEVER RIGHT   Number of Reps  2    Overall Response Improved Symptoms    Response Details  vibration placed over R mastoid 30 sec before maneuver and 30 sec during 1st position of manuver      [employer]   Number of Reps  2    Symptom Description  each side   R upbeating torsional nystagmus on 2nd trial to R                PT Education - 02/02/20 1751    Education Details update to HEP; review of post-Epley instructions    Person(s) Educated Patient;Child(ren)   Son   Methods Explanation;Demonstration;Tactile cues;Verbal cues;Handout  Comprehension Verbalized understanding;Returned demonstration            PT Short Term Goals - 01/25/20 1543      PT SHORT TERM GOAL #1   Title Patient to report understanding and compliance with post-Epley instructions.    Time 3    Period Weeks    Status Achieved   notes that she followed instructions for 1 weeks rather than 1 day   Target Date 01/11/20             PT Long Term Goals - 01/25/20 1544      PT LONG TERM GOAL #1   Title Patient to report no dizziness and demonstrate no nystagmus with positional testing.    Time 4    Period Weeks    Status On-going   ongoing dizziness and nystagmus with R [facility]   Target Date 02/22/20      PT LONG TERM GOAL #2   Title Patient to score <14 sec on TUG testing without AD.    Time 6    Period Weeks    Status Achieved   10.93 sec     PT LONG TERM GOAL #3   Title Patient to report 100% resolution of dizziness.    Time 4    Period Weeks    Status On-going   reporting 20% improvement    Target Date 02/22/20      PT LONG TERM GOAL #4   Title Patient to score >19/24 on DGI in order to decrease risk of falls.    Time 4    Period Weeks    Status On-going   18/24   Target Date 02/22/20                 Plan - 02/02/20 1752    Clinical Impression Statement Patient reporting improvement in dizziness and notes that she feels that the duration of dizziness is decreasing when getting up from bed and reading in bed. Assessed R and L DH as well as R and L Roll test with no dizziness. Educated patient on [name] to B directions. Upon 2nd trial of R [name], patient reported dizziness and demonstrated R upbeating torsional nystagmus. Proceeded with 2 trials of R Epley with vibration over the R mastoid. Patient tolerated these maneuvers well, and with no dizziness up on sitting. Reviewed post-Epley instructions with patient and educated patient's son on performance of [name] for max safety. Both reported understanding.    Comorbidities thyroid disease, HTN, DM, cervical dystonia, bladder stimulator    PT Frequency Other (comment)   1-2x   PT Duration 4 weeks    PT Treatment/Interventions ADLs/Self Care Home Management;Canalith Repostioning;Cryotherapy;Moist Heat;Balance training;Therapeutic exercise;Therapeutic activities;Functional mobility training;Stair training;Gait training;DME Instruction;Neuromuscular re-education;Patient/family education;Manual techniques;Vestibular    PT Next Visit Plan reassess R DH    Consulted and Agree with Plan of Care Patient           Patient will benefit from skilled therapeutic intervention in order to improve the following deficits and impairments:  Abnormal gait, Decreased activity tolerance, Decreased balance, Decreased range of motion, Improper body mechanics, Postural dysfunction, Impaired flexibility, Dizziness  Visit Diagnosis: Dizziness and giddiness  Unsteadiness on feet     Problem List Patient Active Problem  List   Diagnosis Date Noted  . Vertigo 12/01/2019  . Cervical dystonia 03/08/2019  . Hyperlipidemia 02/11/2019  . Edema 02/11/2019  . Hypertension 01/19/2019  . Type 2 diabetes mellitus ([place]) 01/19/2019  . Hypothyroidism (acquired)  01/19/2019  . Neck pain 01/19/2019  . Dystonia 01/19/2019  . Adhesive capsulitis of left shoulder 12/22/2018    Anette Guarneri, PT, DPT 02/02/20 6:05 PM   [facility] [address]  [address] [place], [place], [address] Phone: [phone]   Fax:  [phone]  Name: Leslie Ward MRN: [id number] Date of Birth: [birth year]

## 2020-02-08 ENCOUNTER — Ambulatory Visit: Payer: Medicare Other | Admitting: Physical Therapy

## 2020-02-21 ENCOUNTER — Encounter: Payer: Self-pay | Admitting: Physical Therapy

## 2020-02-21 ENCOUNTER — Ambulatory Visit: Payer: Medicare Other | Attending: Medical | Admitting: Physical Therapy

## 2020-02-21 ENCOUNTER — Other Ambulatory Visit: Payer: Self-pay

## 2020-02-21 DIAGNOSIS — R42 Dizziness and giddiness: Secondary | ICD-10-CM | POA: Diagnosis present

## 2020-02-21 DIAGNOSIS — R2681 Unsteadiness on feet: Secondary | ICD-10-CM

## 2020-02-21 NOTE — Therapy (Signed)
[facility] [address]  [place], [place], [address] Phone: [phone]   Fax:  [phone]  Physical Therapy Discharge Summary  Patient Details  Name: Leslie Ward MRN: [id number] Date of Birth: Jan 23, 1931 Referring Provider (PT): Mackie Pai, PA-C  Progress Note Reporting Period 02/01/20 to 02/21/20  See note below for Objective Data and Assessment of Progress/Goals.      Encounter Date: 02/21/2020   PT End of Session - 02/21/20 1802    Visit Number 5    Number of Visits 11    Date for PT Re-Evaluation 02/22/20    Authorization Type Medicare & Medicaid    PT Start Time 1659    PT Stop Time 1753    PT Time Calculation (min) 54 min    Equipment Utilized During Treatment Gait belt    Activity Tolerance Patient tolerated treatment well    Behavior During Therapy WFL for tasks assessed/performed           Past Medical History:  Diagnosis Date  . Dystonia   . Hypertension   . Thyroid disease     Past Surgical History:  Procedure Laterality Date  . NO PAST SURGERIES      There were no vitals filed for this visit.   Subjective Assessment - 02/21/20 1659    Subjective Had her son assist her with her HEP- performed it 3x/day. Notes no improvement in dizziness with increased reps. Notes that it did make her dizzy and still having dizziness when reading her book. 0/10 dizziness currently in sitting.    Pertinent History thyroid disease, HTN, DM, cervical dystonia, bladder stimulator    Diagnostic tests 04/20 CT head/neck: evidence of age-indeterminate lacunar and basal ganglia strokes, no history of CVA on CT; repeat CT on 04/21 unchanged    Patient Stated Goals get rid of dizziness    Currently in Pain? No/denies              [facility] PT Assessment - 02/21/20 0001      Dynamic Gait Index   Level Surface Normal    Change in Gait Speed Mild Impairment    Gait with Horizontal Head Turns Normal     Gait with Vertical Head Turns Normal    Gait and Pivot Turn Mild Impairment    Step Over Obstacle Normal    Step Around Obstacles Normal    Steps Mild Impairment    Total Score 21                         [facility] Adult PT Treatment/Exercise - 02/21/20 0001      Self-Care   Self-Care Other Self-Care Comments    Other Self-Care Comments  simulation of reclined position for reading to assess for dizziness as well as education on taking time between position changes to allow dizziness to resolve fully           Vestibular Treatment/Exercise - 02/21/20 0001      Vestibular Treatment/Exercise   Habituation Exercises Comment      [name]   Number of Reps  5    Symptom Description  3x each side EO, 2x each side EC   torsional R upbeating nystagmus on R side                PT Education - 02/21/20 1756    Education Details review of [name] for correct positioning and taking video of  patient performing exercise correctly for max carryover, review of strategies to safely function with BPPV    Person(s) Educated Patient;Child(ren)   son   Methods Explanation;Demonstration;Tactile cues;Verbal cues;Other (comment)   video recording on patient's phone   Comprehension Verbalized understanding;Returned demonstration            PT Short Term Goals - 01/25/20 1543      PT SHORT TERM GOAL #1   Title Patient to report understanding and compliance with post-Epley instructions.    Time 3    Period Weeks    Status Achieved   notes that she followed instructions for 1 weeks rather than 1 day   Target Date 01/11/20             PT Long Term Goals - 02/21/20 1809      PT LONG TERM GOAL #1   Title Patient to report no dizziness and demonstrate no nystagmus with positional testing.    Time 4    Period Weeks    Status Not Met   ongoing dizziness and nystagmus with R sidelying test     PT LONG TERM GOAL #2   Title Patient to score <14 sec on TUG testing  without AD.    Time 6    Period Weeks    Status Achieved   10.93 sec     PT LONG TERM GOAL #3   Title Patient to report 100% resolution of dizziness.    Time 4    Period Weeks    Status Partially Met   reporting 30% improvement     PT LONG TERM GOAL #4   Title Patient to score >19/24 on DGI in order to decrease risk of falls.    Time 4    Period Weeks    Status Achieved   21/24                Plan - 02/21/20 1802    Clinical Impression Statement Patient reports dizziness unchanged from last session, but overall with 30% improvement compared to initial eval. Reported compliance with [name] 3x/day. This exercise was review to assess carryover, with patient requiring considerable correction in proper positioning. Patient demonstrated R upbeating torsional nystagmus upon in R [place] Daroff/sidelying test which decreased with each subsequent repetition. Patient also able to perform last couple reps with EC with no c/o dizziness. Patient's score on DGI now indicated decreased risk of falls as patient is now looking more steady overall. Took video of patient performing [name] correctly for max carryover and educated patient on coping mechanisms for safely functioning with BPPV. Advised patient and son that patient should f/u with PCP for further assessment of dizziness if indicated. Patient reported understanding and without complaints at end of session.    Comorbidities thyroid disease, HTN, DM, cervical dystonia, bladder stimulator    PT Frequency Other (comment)   1-2x   PT Duration 4 weeks    PT Treatment/Interventions ADLs/Self Care Home Management;Canalith Repostioning;Cryotherapy;Moist Heat;Balance training;Therapeutic exercise;Therapeutic activities;Functional mobility training;Stair training;Gait training;DME Instruction;Neuromuscular re-education;Patient/family education;Manual techniques;Vestibular    PT Next Visit Plan DC at this time    Consulted and Agree with  Plan of Care Patient           Patient will benefit from skilled therapeutic intervention in order to improve the following deficits and impairments:  Abnormal gait, Decreased activity tolerance, Decreased balance, Decreased range of motion, Improper body mechanics, Postural dysfunction, Impaired flexibility, Dizziness  Visit Diagnosis: Dizziness and giddiness  Unsteadiness on feet     Problem List Patient Active Problem List   Diagnosis Date Noted  . Vertigo 12/01/2019  . Cervical dystonia 03/08/2019  . Hyperlipidemia 02/11/2019  . Edema 02/11/2019  . Hypertension 01/19/2019  . Type 2 diabetes mellitus ([place]) 01/19/2019  . Hypothyroidism (acquired) 01/19/2019  . Neck pain 01/19/2019  . Dystonia 01/19/2019  . Adhesive capsulitis of left shoulder 12/22/2018     Janene Harvey, PT, DPT 02/21/20 6:11 PM   [facility] [address]  [place], [place], [address] Phone: [phone]   Fax:  [phone]  Name: Leslie Ward MRN: [id number] Date of Birth: 04-08-1931

## 2020-02-27 ENCOUNTER — Other Ambulatory Visit: Payer: Self-pay

## 2020-02-27 ENCOUNTER — Encounter: Payer: Self-pay | Admitting: Emergency Medicine

## 2020-02-27 ENCOUNTER — Emergency Department (INDEPENDENT_AMBULATORY_CARE_PROVIDER_SITE_OTHER): Payer: Medicare Other

## 2020-02-27 ENCOUNTER — Emergency Department (INDEPENDENT_AMBULATORY_CARE_PROVIDER_SITE_OTHER)
Admission: EM | Admit: 2020-02-27 | Discharge: 2020-02-27 | Disposition: A | Discharge status: Home / Self Care | Payer: Medicare Other | Source: Home / Self Care | Attending: Family Medicine | Admitting: Family Medicine

## 2020-02-27 DIAGNOSIS — M545 Low back pain, unspecified: Secondary | ICD-10-CM

## 2020-02-27 DIAGNOSIS — R1031 Right lower quadrant pain: Secondary | ICD-10-CM | POA: Diagnosis not present

## 2020-02-27 LAB — POCT URINALYSIS DIP (MANUAL ENTRY)
Bilirubin, UA: NEGATIVE
Blood, UA: NEGATIVE
Glucose, UA: NEGATIVE mg/dL
Nitrite, UA: NEGATIVE
Protein Ur, POC: NEGATIVE mg/dL
Spec Grav, UA: 1.02 (ref 1.010–1.025)
Urobilinogen, UA: 0.2 E.U./dL
pH, UA: 5.5 (ref 5.0–8.0)

## 2020-02-27 LAB — POCT CBC W AUTO DIFF (K'VILLE URGENT CARE)

## 2020-02-27 IMAGING — DX DG ABDOMEN 2V
2 series · 2 of 2 positions shown · non-contrast
Comparison: Lumbar spine radiographs dated [DATE]

CLINICAL DATA: Right back pain for the past 3 days.

EXAM:
ABDOMEN - 2 VIEW

[abdomen erect]
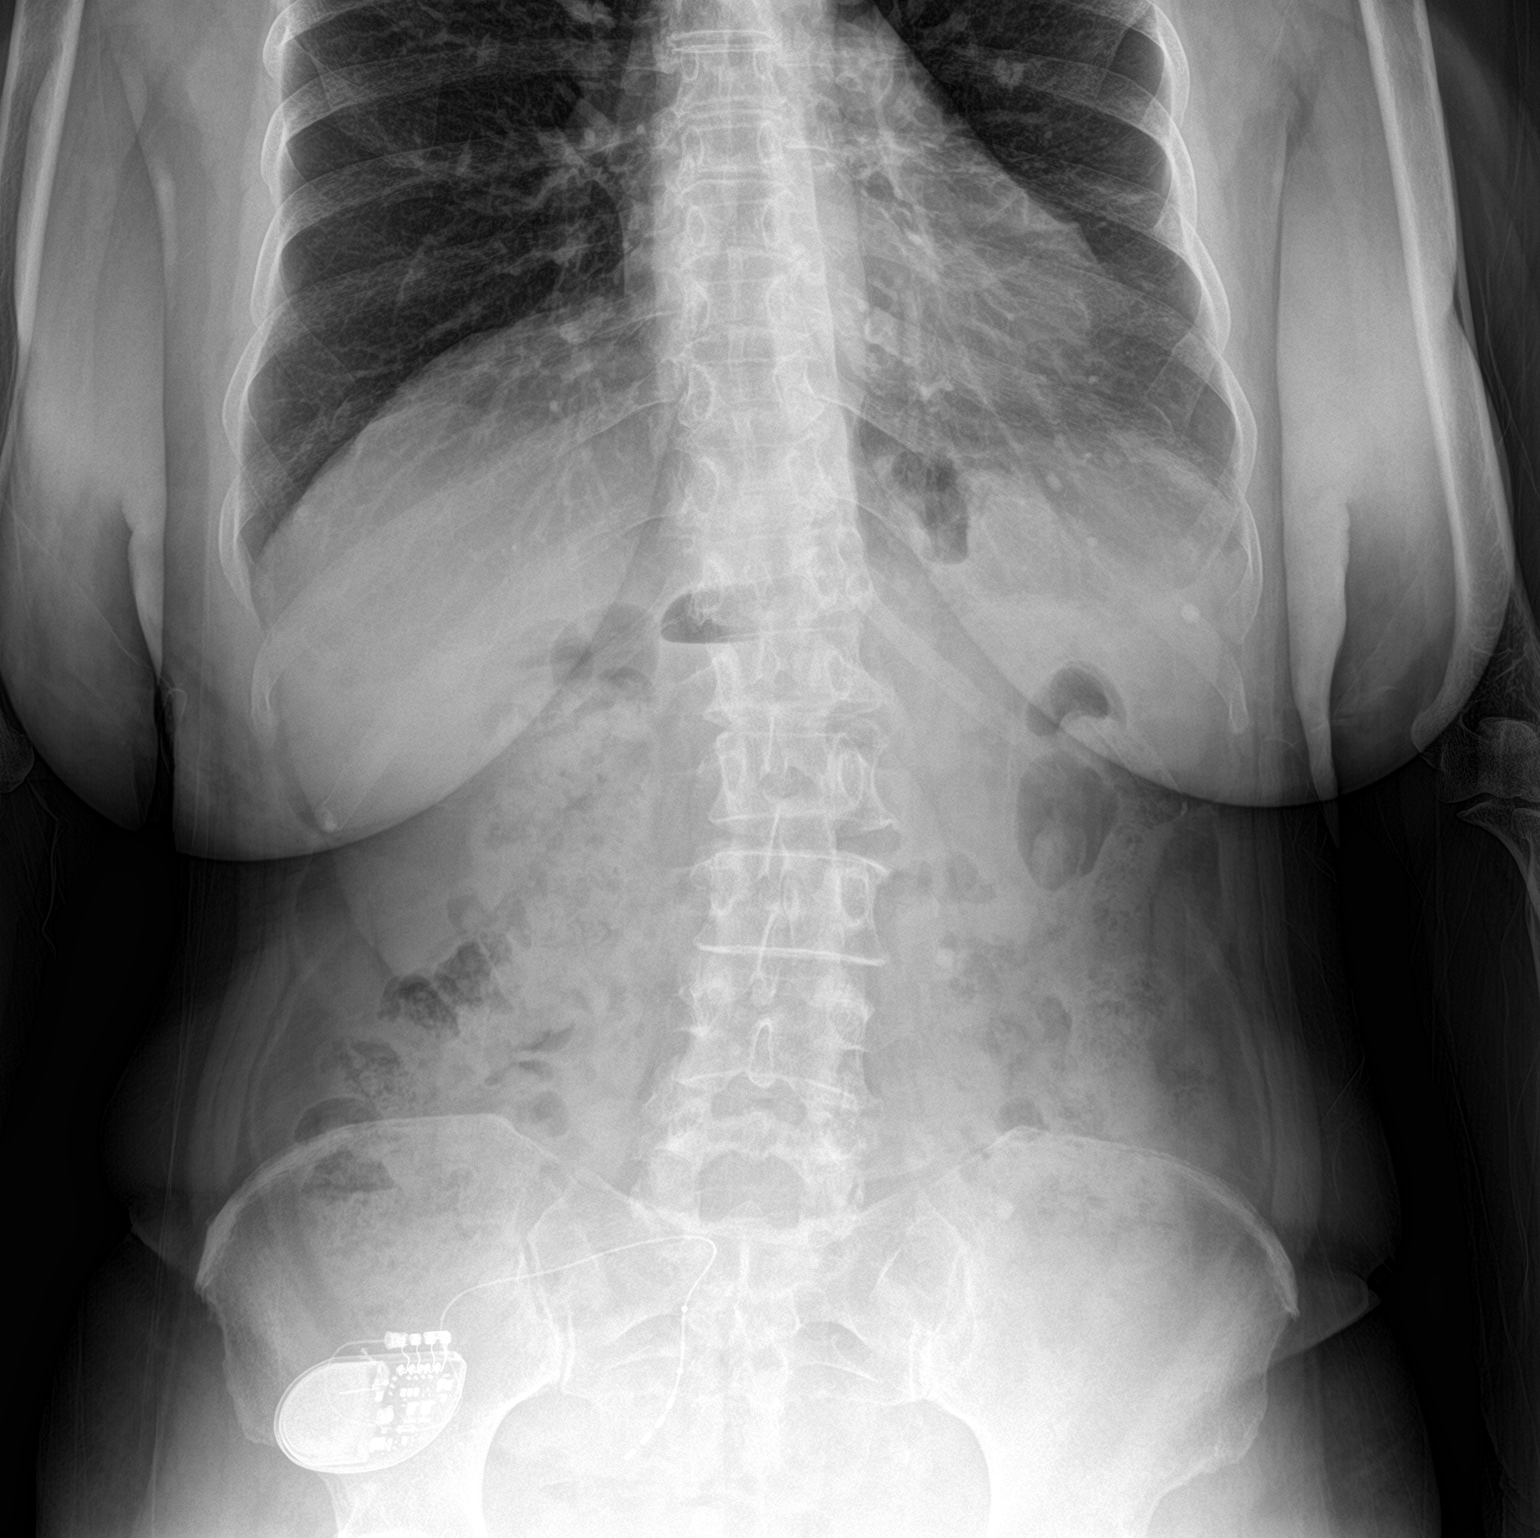

[abdomen supine]
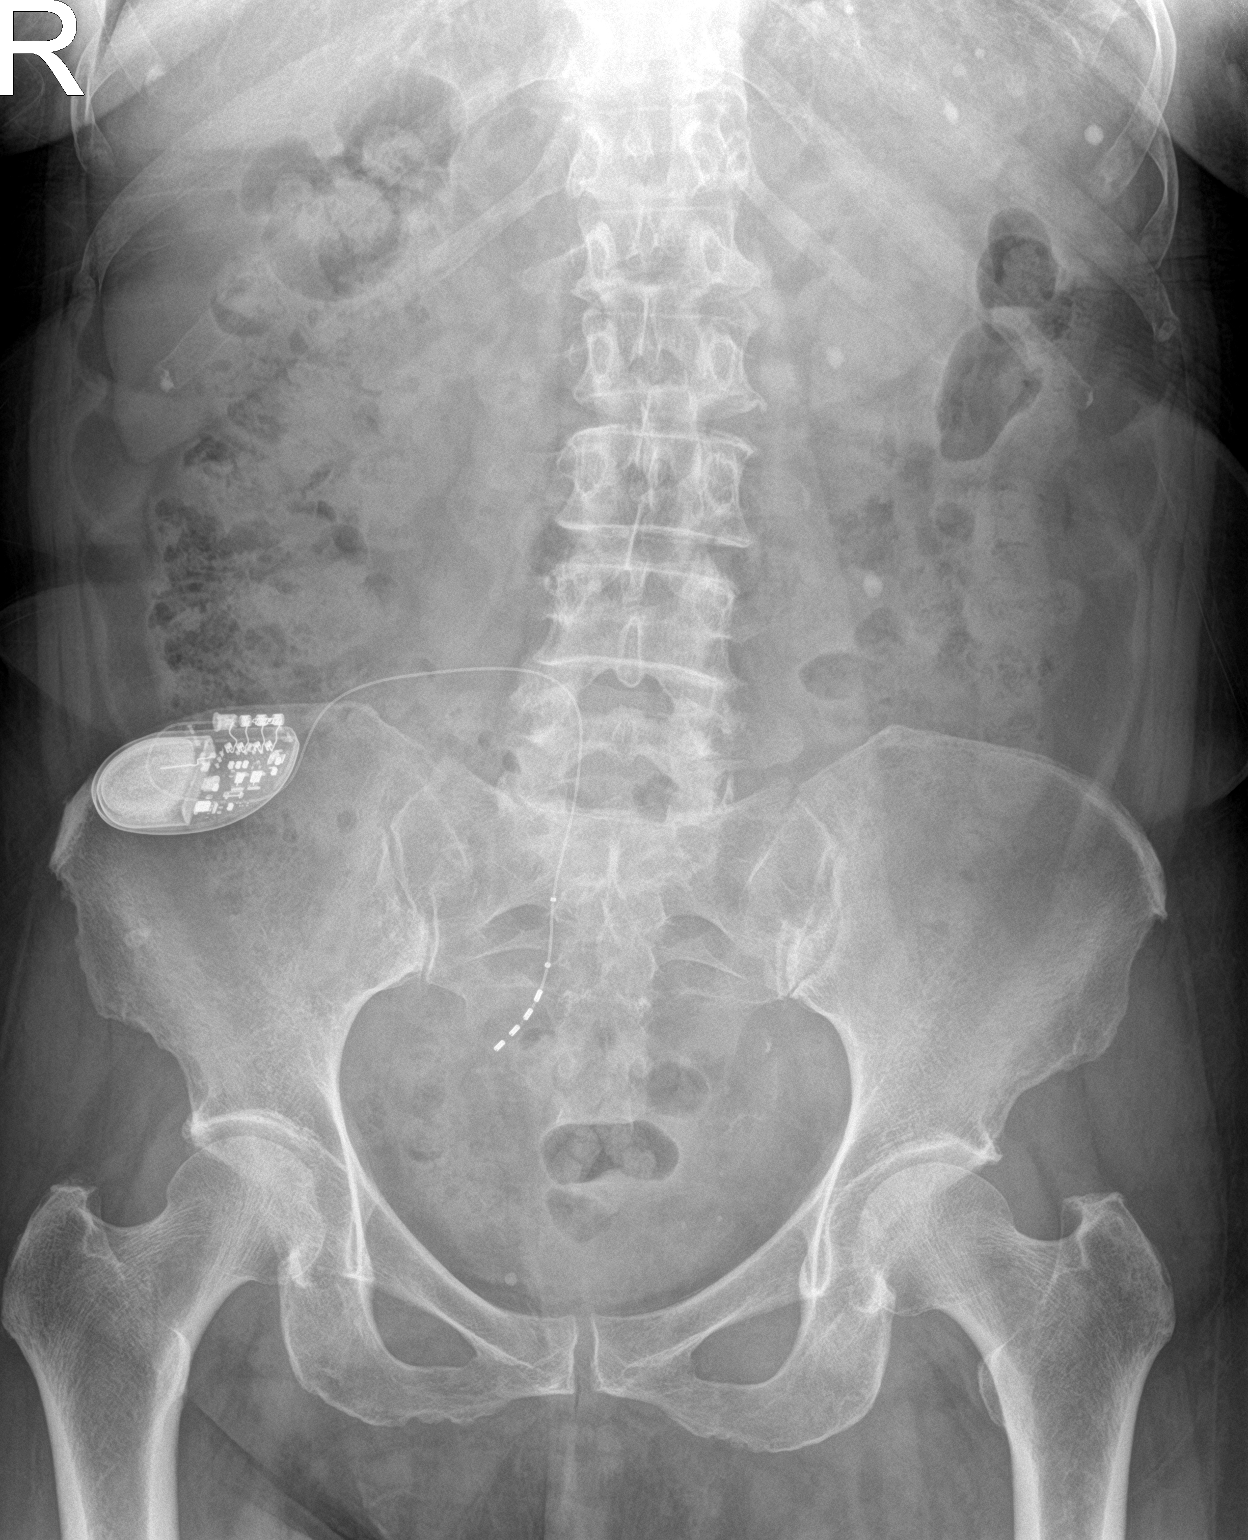

[2 of 2 positions shown; findings below may reference images not displayed]

FINDINGS: Normal bowel gas pattern with mildly prominent stool in the colon. A
right lower lumbar and sacral neural stimulator is unchanged.
Calcified granulomata again demonstrated in the spleen and liver.
Two small oval calcifications in the left mid and lower abdomen
medially, possibly representing phleboliths. Mild scoliosis and mild
lumbar spine degenerative changes.
IMPRESSION: No acute abnormality. Mildly prominent stool.

## 2020-02-27 IMAGING — DX DG LUMBAR SPINE COMPLETE 4+V
5 series · 5 of 5 positions shown · non-contrast
Comparison: None.

CLINICAL DATA: Low back pain for 3 days without trauma.

EXAM:
LUMBAR SPINE - COMPLETE 4+ VIEW

[l-spine ap]
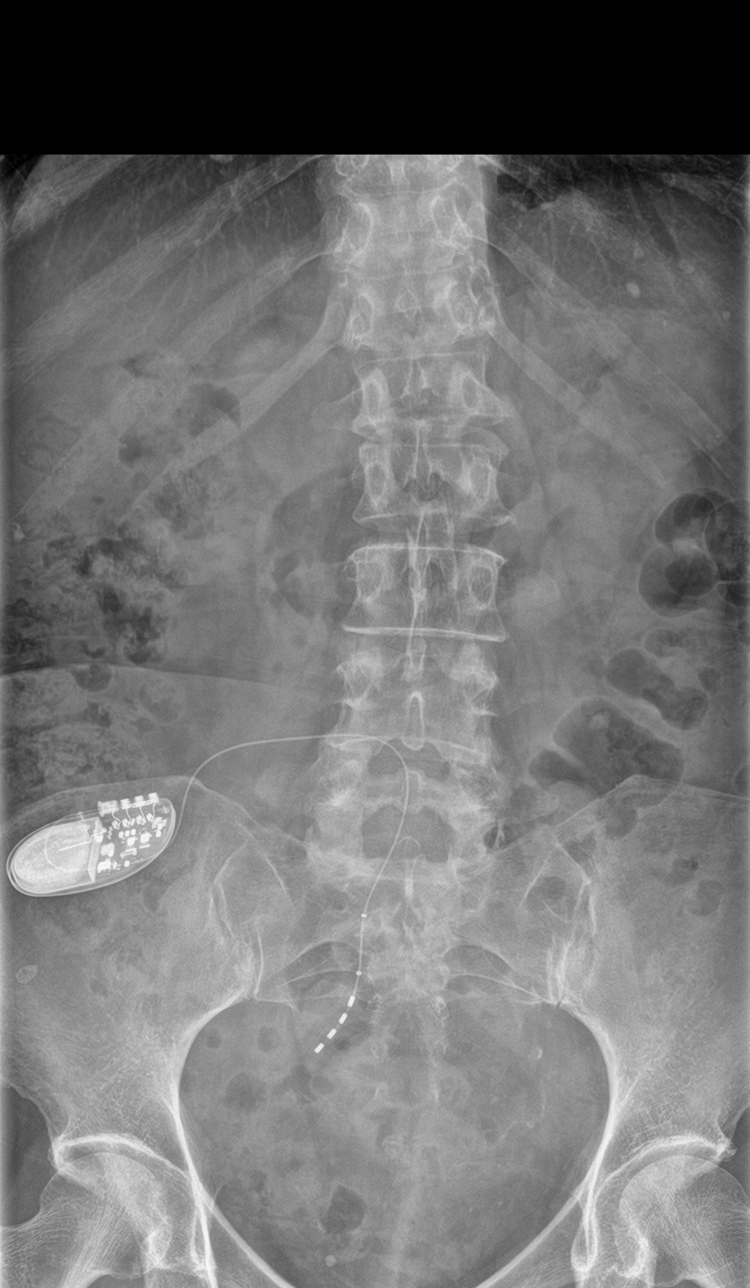

[l-spine obl (1 of 2)]
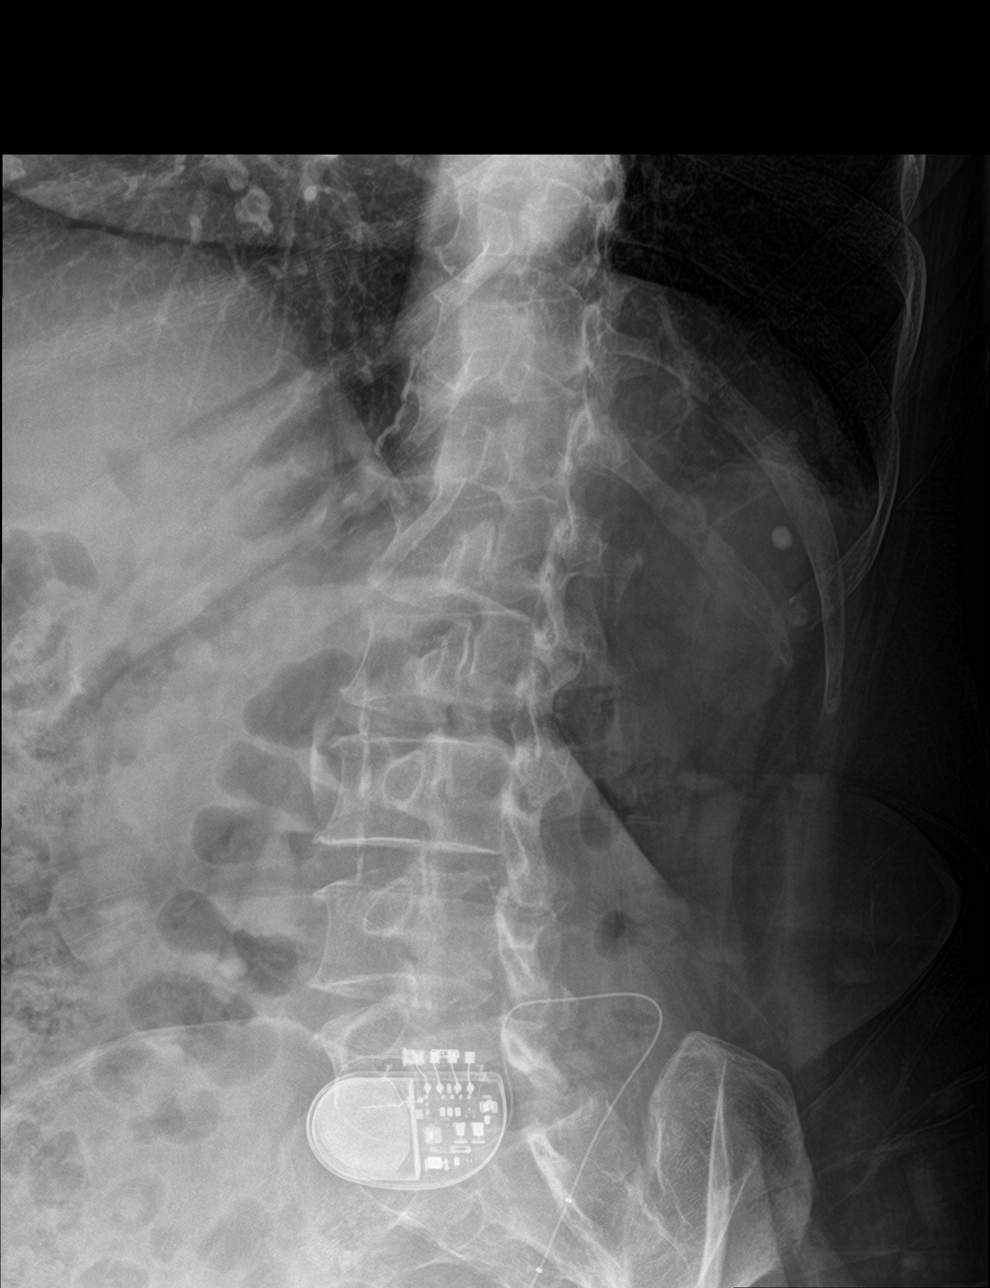

[l-spine obl (2 of 2)]
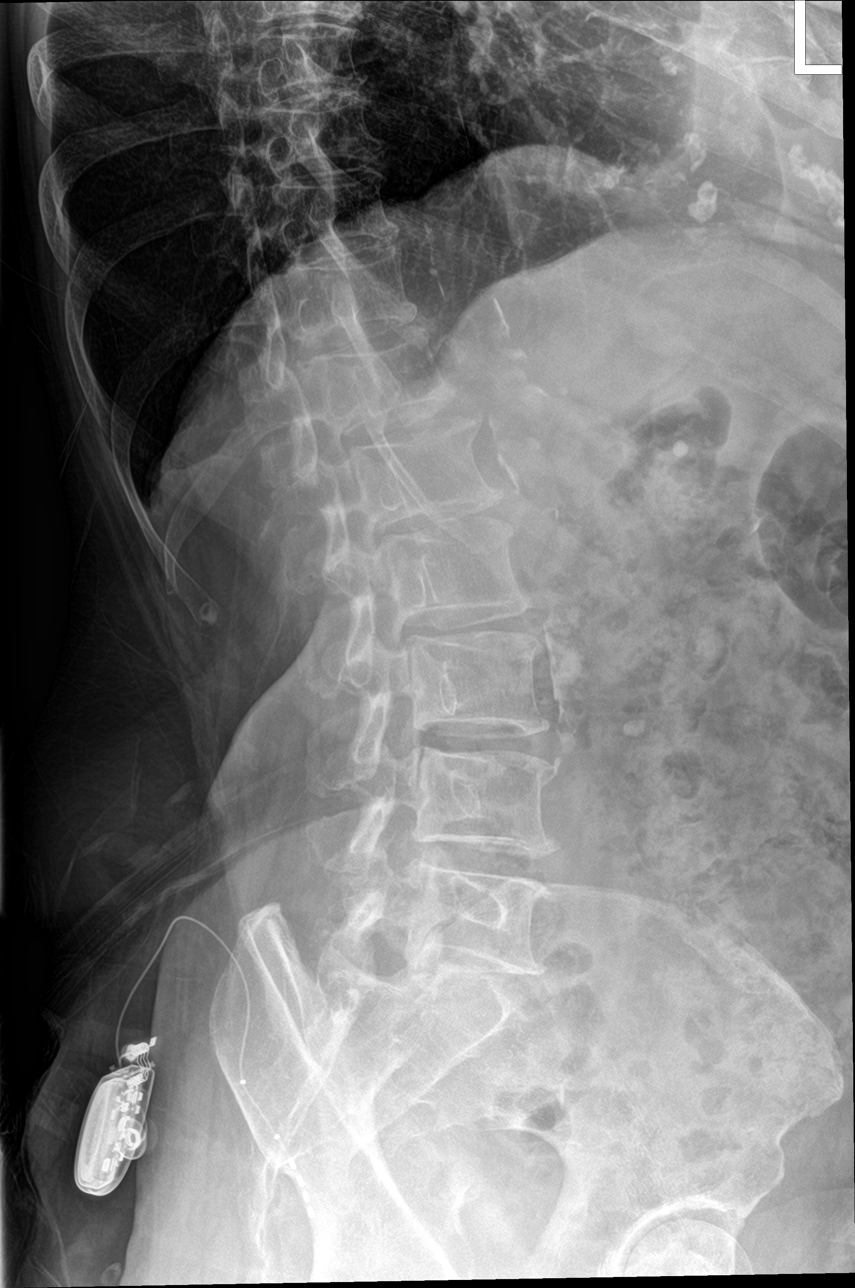

[l-spine lat]
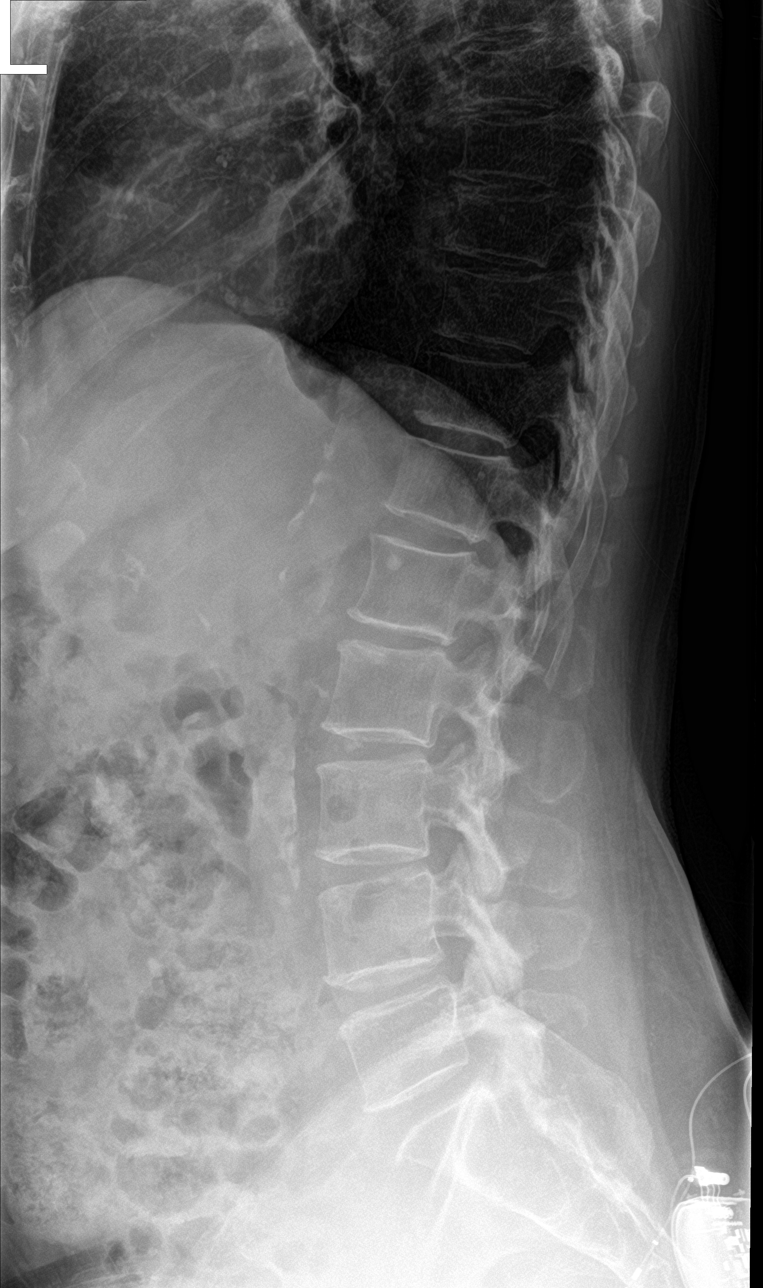

[l-spine spot]
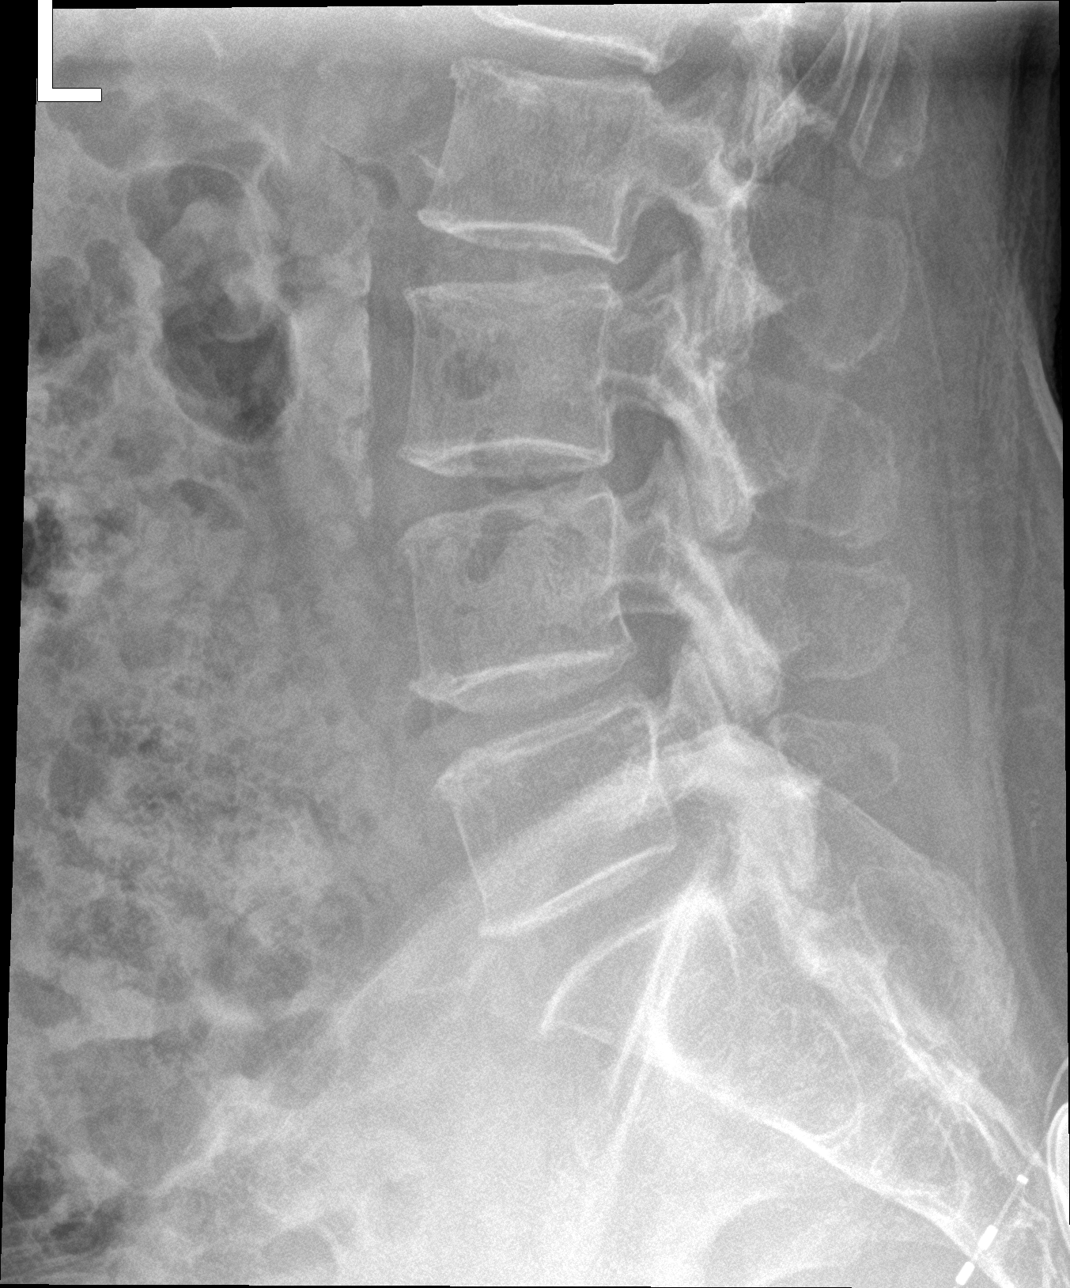

[5 of 5 positions shown; findings below may reference images not displayed]

FINDINGS: Five lumbar type vertebral bodies. Minimal convex left lumbar spine
curvature. Sacroiliac joints are symmetric. Aortic atherosclerosis.
Maintenance of vertebral body height and alignment. Presacral
stimulator is incompletely imaged. Mild for age spondylosis.
IMPRESSION: No acute findings.

## 2020-02-27 MED ORDER — AMOXICILLIN-POT CLAVULANATE 875-125 MG PO TABS: ORAL_TABLET | ORAL | 0 refills | Status: DC

## 2020-02-27 NOTE — ED Triage Notes (Signed)
Patient c/o right side pain that radiates from her belly button to her back, no injury to area.  Patient has taken 2 Tylenol w/o relief, heating pad no relief.  No urinary sx's, BM's are normal.  Problem is getting up from bed.

## 2020-02-27 NOTE — ED Provider Notes (Signed)
[facility]    CSN: [id number] Arrival date & time: 02/27/20  9371      History   Chief Complaint Chief Complaint  Patient presents with  . Right Sided Pain    HPI Leslie Ward is a 86 y.o. female.   Patient complains of 3 day history of pain in her right abdomen radiating to her right flank.  The pain is constant and worse with movement.  She denies urinary, GI, and respiratory symptoms although she has pain with deep inspiration.  She recalls no injury and denies fevers, chills, and sweats.  She has had no improvement with Tylenol and heating pad    The history is provided by the patient. A language interpreter was used.    Past Medical History:  Diagnosis Date  . Dystonia   . Hypertension   . Thyroid disease     Patient Active Problem List   Diagnosis Date Noted  . Vertigo 12/01/2019  . Cervical dystonia 03/08/2019  . Hyperlipidemia 02/11/2019  . Edema 02/11/2019  . Hypertension 01/19/2019  . Type 2 diabetes mellitus (HCC) 01/19/2019  . Hypothyroidism (acquired) 01/19/2019  . Neck pain 01/19/2019  . Dystonia 01/19/2019  . Adhesive capsulitis of left shoulder 12/22/2018    Past Surgical History:  Procedure Laterality Date  . NO PAST SURGERIES      OB History   No obstetric history on file.      Home Medications    Prior to Admission medications   Medication Sig Start Date End Date Taking? Authorizing Provider  amLODipine (NORVASC) 5 MG tablet TAKE 1 TABLET(5 MG) BY MOUTH TWICE DAILY 12/30/19   Baldo Daub, MD  amoxicillin-clavulanate (AUGMENTIN) 875-125 MG tablet Take one tab PO Q8hr 02/27/20   Lattie Haw, MD  aspirin EC 81 MG EC tablet Take 1 tablet (81 mg total) by mouth daily. 12/02/19   Elgergawy, Leana Roe, MD  Calcium Carbonate (CALCIUM 600 PO) Take 2 tablets by mouth daily.    [provider]  Calcium Polycarbophil (FIBER-CAPS PO) Take 1 capsule by mouth 2 (two) times a day.     [provider]   Cinnamon 500 MG capsule Take 1,000 mg by mouth daily.    [provider]  hydrochlorothiazide (MICROZIDE) 12.5 MG capsule Take 12.5 mg by mouth 2 (two) times a week. Monday and Friday    [provider]  levothyroxine (SYNTHROID) 50 MCG tablet Take 1 tablet (50 mcg total) by mouth daily before breakfast. 02/01/20   Saguier, Ramon Dredge, PA-C  meclizine (ANTIVERT) 12.5 MG tablet Take 1 tablet (12.5 mg total) by mouth 3 (three) times daily as needed for dizziness. 12/13/19   Saguier, Ramon Dredge, PA-C  MELATONIN PO Take 2-3 tablets by mouth at bedtime as needed (sleep).     [provider]  Menaquinone-7 (VITAMIN K2 PO) Take 1 tablet by mouth at bedtime.     [provider]  metoprolol tartrate (LOPRESSOR) 50 MG tablet TAKE 1 TABLET(50 MG) BY MOUTH TWICE DAILY Patient taking differently: Take 50 mg by mouth 2 (two) times daily.  09/29/19   Saguier, Ramon Dredge, PA-C  Multiple Vitamin (MULTIVITAMIN) tablet Take 1 tablet by mouth daily.    [provider]  NON FORMULARY Take 3 tablets by mouth 3 (three) times daily. Cholasta Care    [provider]  Omega-3 Fatty Acids (OMEGA 3 PO) Take 2 tablets by mouth every evening.     [provider]  pantoprazole (PROTONIX) 40 MG tablet Take  1 tablet (40 mg total) by mouth daily. 12/01/19 12/31/19  Elgergawy, Leana Roe, MD  Probiotic Product (PROBIOTIC DAILY PO) Take 1 tablet by mouth daily.    [provider]  telmisartan (MICARDIS) 20 MG tablet TAKE 1 TABLET BY MOUTH DAILY WITH EVENING MEAL OR AT BEDTIME 12/22/19   Baldo Daub, MD  vitamin C (ASCORBIC ACID) 500 MG tablet Take 500 mg by mouth every evening.     [provider]    Family History Family History  Problem Relation Age of Onset  . Stroke Mother   . Heart attack Father   . Heart Problems Brother     Social History Social History   Tobacco Use  . Smoking status: Never Smoker  . Smokeless tobacco: Never Used  Vaping Use  .  Vaping Use: Never used  Substance Use Topics  . Alcohol use: No  . Drug use: No     Allergies   Crestor [rosuvastatin calcium]   Review of Systems Review of Systems  Constitutional: Positive for activity change. Negative for appetite change, chills, diaphoresis, fatigue and fever.  HENT: Negative.   Eyes: Negative.   Respiratory: Negative for cough, chest tightness, shortness of breath and wheezing.   Cardiovascular: Negative.   Gastrointestinal: Positive for abdominal pain. Negative for abdominal distention, constipation, diarrhea, nausea and vomiting.  Genitourinary: Negative.   Musculoskeletal: Positive for back pain. Negative for joint swelling.  Skin: Negative.   Neurological: Negative.   Hematological: Negative.   All other systems reviewed and are negative.    Physical Exam Triage Vital Signs ED Triage Vitals [02/27/20 0951]  Enc Vitals Group     BP 123/80     Pulse Rate 68     Resp      Temp (!) 97.4 F (36.3 C)     Temp Source Oral     SpO2 97 %     Weight      Height      Head Circumference      Peak Flow      Pain Score 8     Pain Loc      Pain Edu?      Excl. in GC?    No data found.  Updated Vital Signs BP 123/80 (BP Location: Right Arm)   Pulse 68   Temp (!) 97.4 F (36.3 C) (Oral)   SpO2 97%   Visual Acuity Right Eye Distance:   Left Eye Distance:   Bilateral Distance:    Right Eye Near:   Left Eye Near:    Bilateral Near:     Physical Exam Vitals and nursing note reviewed.  Constitutional:      General: She is not in acute distress.    Appearance: She is not ill-appearing.  HENT:     Head: Normocephalic.     Right Ear: External ear normal.     Left Ear: External ear normal.     Nose: Nose normal.     Mouth/Throat:     Mouth: Mucous membranes are moist.  Eyes:     Conjunctiva/sclera: Conjunctivae normal.     Pupils: Pupils are equal, round, and reactive to light.  Cardiovascular:     Rate and Rhythm: Normal rate and  regular rhythm.     Heart sounds: Normal heart sounds.  Pulmonary:     Effort: Pulmonary effort is normal.     Breath sounds: Normal breath sounds.       Comments: Patient has tenderness to  palpation right lower back.  Pain is elicited with flexion of right hip. Abdominal:     General: Abdomen is flat. Bowel sounds are normal. There is no distension.     Palpations: Abdomen is soft. There is no hepatomegaly or splenomegaly.     Tenderness: There is no right CVA tenderness.       Comments: Patient complains of pain in her peri-umbilical area yet there is no tenderness to palpation there.  She has mild right flank pain.  Musculoskeletal:     Right lower leg: No edema.     Left lower leg: No edema.  Lymphadenopathy:     Cervical: No cervical adenopathy.  Skin:    General: Skin is warm and dry.     Findings: No rash.  Neurological:     Mental Status: She is alert and oriented to person, place, and time.      UC Treatments / Results  Labs (all labs ordered are listed, but only abnormal results are displayed) Labs Reviewed  POCT URINALYSIS DIP (MANUAL ENTRY) - Abnormal; Notable for the following components:      Result Value   Ketones, POC UA trace (5) (*)    Leukocytes, UA Trace (*)    All other components within normal limits  POCT CBC W AUTO DIFF ([facility]):  WBC 11.1; LY 32.0; MO 3.7; GR 64.3; Hgb 13.6; Platelets 355     EKG   Radiology DG Lumbar Spine Complete  Result Date: 02/27/2020 CLINICAL DATA:  Low back pain for 3 days without trauma. EXAM: LUMBAR SPINE - COMPLETE 4+ VIEW COMPARISON:  None. FINDINGS: Five lumbar type vertebral bodies. Minimal convex left lumbar spine curvature. Sacroiliac joints are symmetric. Aortic atherosclerosis. Maintenance of vertebral body height and alignment. Presacral stimulator is incompletely imaged. Mild for age spondylosis. IMPRESSION: No acute findings. Electronically Signed   By: Jeronimo GreavesKyle  Talbot M.D.   On: 02/27/2020 11:27    DG Abd 2 Views  Result Date: 02/27/2020 CLINICAL DATA:  Right back pain for the past 3 days. EXAM: ABDOMEN - 2 VIEW COMPARISON:  Lumbar spine radiographs dated 02/27/2020 FINDINGS: Normal bowel gas pattern with mildly prominent stool in the colon. A right lower lumbar and sacral neural stimulator is unchanged. Calcified granulomata again demonstrated in the spleen and liver. Two small oval calcifications in the left mid and lower abdomen medially, possibly representing phleboliths. Mild scoliosis and mild lumbar spine degenerative changes. IMPRESSION: No acute abnormality. Mildly prominent stool. Electronically Signed   By: Beckie SaltsSteven  Reid M.D.   On: 02/27/2020 12:45    Procedures Procedures (including critical care time)  Medications Ordered in UC Medications - No data to display  Initial Impression / Assessment and Plan / UC Course  I have reviewed the triage vital signs and the nursing notes.  Pertinent labs & imaging results that were available during my care of the patient were reviewed by me and considered in my medical decision making (see chart for details).    Note mild leukocytosis (WBC 11.1) Lumbar spine X-ray negative.  Note mildly prominent stool on Abdomen 2 view x-ray. Suspect diverticulitis.  Urine culture pending. Begin Augmentin 875mg  TID (#21). Followup with Family Doctor in about 3 to 4 days.  Final Clinical Impressions(s) / UC Diagnoses   Final diagnoses:  Right lower quadrant abdominal pain  Acute right-sided low back pain without sciatica     Discharge Instructions        Begin clear liquids for about 18 to 24  hours, then may begin a [employer] (Bananas, Rice, Applesauce, Toast) when pain improved.  Then gradually advance to a regular diet as tolerated.    May take Tylenol as needed for pain. If symptoms become significantly worse during the night or over the weekend, proceed to the local emergency room.     ED Prescriptions    Medication Sig Dispense  Auth. Provider   amoxicillin-clavulanate (AUGMENTIN) 875-125 MG tablet Take one tab PO Q8hr 21 tablet Lattie Haw, MD        Lattie Haw, MD 02/27/20 1311

## 2020-02-27 NOTE — Discharge Instructions (Addendum)
Suspect acute diverticulitis.  Begin clear liquids for about 18 to 24 hours, then may begin a BRAT diet (Bananas, Rice, Applesauce, Toast) when pain improved.  Then gradually advance to a regular diet as tolerated.    May take Tylenol as needed for pain. If symptoms become significantly worse during the night or over the weekend, proceed to the local emergency room.

## 2020-02-28 LAB — URINE CULTURE
MICRO NUMBER:: 10720089
SPECIMEN QUALITY:: ADEQUATE

## 2020-03-01 ENCOUNTER — Ambulatory Visit (INDEPENDENT_AMBULATORY_CARE_PROVIDER_SITE_OTHER): Payer: Medicare Other | Admitting: Medical

## 2020-03-01 ENCOUNTER — Encounter: Payer: Self-pay | Admitting: Medical

## 2020-03-01 ENCOUNTER — Other Ambulatory Visit: Payer: Self-pay

## 2020-03-01 ENCOUNTER — Telehealth: Payer: Self-pay | Admitting: Medical

## 2020-03-01 VITALS — BP 130/62 | HR 79 | Temp 98.3°F | Resp 20 | Ht 59.0 in | Wt 119.0 lb

## 2020-03-01 DIAGNOSIS — R1031 Right lower quadrant pain: Secondary | ICD-10-CM

## 2020-03-01 LAB — POC URINALSYSI DIPSTICK (AUTOMATED)
Bilirubin, UA: NEGATIVE
Blood, UA: POSITIVE
Glucose, UA: NEGATIVE
Ketones, UA: NEGATIVE
Leukocytes, UA: NEGATIVE
Nitrite, UA: NEGATIVE
Protein, UA: POSITIVE — AB
Spec Grav, UA: 1.025 (ref 1.010–1.025)
Urobilinogen, UA: 0.2 E.U./dL
pH, UA: 6 (ref 5.0–8.0)

## 2020-03-01 LAB — COMPREHENSIVE METABOLIC PANEL
AG Ratio: 1.3 (calc) (ref 1.0–2.5)
ALT: 9 U/L (ref 6–29)
AST: 17 U/L (ref 10–35)
Albumin: 4 g/dL (ref 3.6–5.1)
Alkaline phosphatase (APISO): 63 U/L (ref 37–153)
BUN: 11 mg/dL (ref 7–25)
CO2: 26 mmol/L (ref 20–32)
Calcium: 8.7 mg/dL (ref 8.6–10.4)
Chloride: 107 mmol/L (ref 98–110)
Creat: 0.86 mg/dL (ref 0.60–0.88)
Globulin: 3.2 g/dL (calc) (ref 1.9–3.7)
Glucose, Bld: 92 mg/dL (ref 65–99)
Potassium: 4.2 mmol/L (ref 3.5–5.3)
Sodium: 138 mmol/L (ref 135–146)
Total Bilirubin: 0.8 mg/dL (ref 0.2–1.2)
Total Protein: 7.2 g/dL (ref 6.1–8.1)

## 2020-03-01 LAB — CBC WITH DIFFERENTIAL/PLATELET
Absolute Monocytes: 538 cells/uL (ref 200–950)
Basophils Absolute: 39 cells/uL (ref 0–200)
Basophils Relative: 0.5 %
Eosinophils Absolute: 78 cells/uL (ref 15–500)
Eosinophils Relative: 1 %
HCT: 41 % (ref 35.0–45.0)
Hemoglobin: 13.1 g/dL (ref 11.7–15.5)
Lymphs Abs: 3518 cells/uL (ref 850–3900)
MCH: 29.3 pg (ref 27.0–33.0)
MCHC: 32 g/dL (ref 32.0–36.0)
MCV: 91.7 fL (ref 80.0–100.0)
MPV: 9.2 fL (ref 7.5–12.5)
Monocytes Relative: 6.9 %
Neutro Abs: 3627 cells/uL (ref 1500–7800)
Neutrophils Relative %: 46.5 %
Platelets: 303 10*3/uL (ref 140–400)
RBC: 4.47 10*6/uL (ref 3.80–5.10)
RDW: 12.5 % (ref 11.0–15.0)
Total Lymphocyte: 45.1 %
WBC: 7.8 10*3/uL (ref 3.8–10.8)

## 2020-03-01 LAB — LIPASE: Lipase: 19 U/L (ref 7–60)

## 2020-03-01 NOTE — Patient Instructions (Addendum)
For abd pain/rlq pain, I will get cbc, cmp and lipase stat.  Continue current treatment/augmentin.  I have ct abd/pelvis order and will try to get authorized for tomorrow. May have appendicitis, diverticulitis or stone. Other dx in differential but these are top presently.  Recommend past midnight drink only water.  Some blood in urine. Will get culture.  If pain worsens tonight or labs with emergent findings then ED evaluation tonight.  Follow up date to be determined after study review.

## 2020-03-01 NOTE — Telephone Encounter (Signed)
Would you work on ct abd pelvis with contrast tomorrow am stat. Pt had cmp if needed since ordering with contrast. Please update me on pror auth if needed

## 2020-03-01 NOTE — Progress Notes (Signed)
Subjective:    Patient ID: Leslie Ward, female    DOB: Dec 09, 1930, 86 y.o.   MRN: [id number]  HPI   Pt in for abdomen pain.   She went to [facility] on Sunday. Past Thursday had pain in abdomen and on some on rt side lower back. Pt had work up and told divertilcuitis. Pt had severe pain. Pt now feels mild  better. Pain was severe at [facility] and now moderate per pt description and son description.  Pt pain 5-6/10 today. Yesterday pain was severe and she was crying.  Pt started on augmentin from UC.  Pt started to have some loose stools about 3 days after starting augmentin. But not any watery stools.  Pt son states she has appendix. No hx of know appendectomy.    Review of Systems  Constitutional: Negative for chills and fever.  Cardiovascular: Negative for chest pain and palpitations.  Gastrointestinal: Positive for abdominal pain. Negative for blood in stool, constipation, nausea and vomiting.       Decreased appetite. See hpi.  Genitourinary: Negative for difficulty urinating, dysuria and flank pain.  Musculoskeletal: Negative for back pain.  Skin: Negative for rash.  Hematological: Negative for adenopathy. Does not bruise/bleed easily.  Psychiatric/Behavioral: Negative for behavioral problems, sleep disturbance and suicidal ideas. The patient is not nervous/anxious.     Past Medical History:  Diagnosis Date  . Dystonia   . Hypertension   . Thyroid disease      Social History   Socioeconomic History  . Marital status: Widowed    Spouse name: Not on file  . Number of children: 1  . Years of education: 6 or 7 years of education  . Highest education level: Not on file  Occupational History  . Occupation: Retired  Tobacco Use  . Smoking status: Never Smoker  . Smokeless tobacco: Never Used  Vaping Use  . Vaping Use: Never used  Substance and Sexual Activity  . Alcohol use: No  . Drug use: No  . Sexual activity: Not Currently  Other Topics Concern  . Not on file    Social History Narrative   Lives alone.   Right-handed.   No caffeine use.   Social Determinants of Health   Financial Resource Strain:   . Difficulty of Paying Living Expenses:   Food Insecurity:   . Worried About Programme researcher, broadcasting/film/video in the Last Year:   . Barista in the Last Year:   Transportation Needs:   . Freight forwarder (Medical):   [name] Lack of Transportation (Non-Medical):   Physical Activity:   . Days of Exercise per Week:   . Minutes of Exercise per Session:   Stress:   . Feeling of Stress :   Social Connections:   . Frequency of Communication with Friends and Family:   . Frequency of Social Gatherings with Friends and Family:   . Attends Religious Services:   . Active Member of Clubs or Organizations:   . Attends Banker Meetings:   [name] Marital Status:   Intimate Partner Violence:   . Fear of Current or Ex-Partner:   . Emotionally Abused:   [name] Physically Abused:   . Sexually Abused:     Past Surgical History:  Procedure Laterality Date  . NO PAST SURGERIES      Family History  Problem Relation Age of Onset  . Stroke Mother   . Heart attack Father   . Heart Problems Brother  Allergies  Allergen Reactions  . Crestor [Rosuvastatin Calcium] Other (See Comments)    "caused me to go into kidney failure"    Current Outpatient Medications on File Prior to Visit  Medication Sig Dispense Refill  . amLODipine (NORVASC) 5 MG tablet TAKE 1 TABLET(5 MG) BY MOUTH TWICE DAILY 180 tablet 1  . amoxicillin-clavulanate (AUGMENTIN) 875-125 MG tablet Take one tab PO Q8hr 21 tablet 0  . aspirin EC 81 MG EC tablet Take 1 tablet (81 mg total) by mouth daily. 30 tablet 0  . Calcium Carbonate (CALCIUM 600 PO) Take 2 tablets by mouth daily.    . Calcium Polycarbophil (FIBER-CAPS PO) Take 1 capsule by mouth 2 (two) times a day.     . Cinnamon 500 MG capsule Take 1,000 mg by mouth daily.    . hydrochlorothiazide (MICROZIDE) 12.5 MG capsule Take 12.5  mg by mouth 2 (two) times a week. Monday and Friday    . levothyroxine (SYNTHROID) 50 MCG tablet Take 1 tablet (50 mcg total) by mouth daily before breakfast. 90 tablet 1  . meclizine (ANTIVERT) 12.5 MG tablet Take 1 tablet (12.5 mg total) by mouth 3 (three) times daily as needed for dizziness. 30 tablet 0  . MELATONIN PO Take 2-3 tablets by mouth at bedtime as needed (sleep).     . Menaquinone-7 (VITAMIN K2 PO) Take 1 tablet by mouth at bedtime.     . metoprolol tartrate (LOPRESSOR) 50 MG tablet TAKE 1 TABLET(50 MG) BY MOUTH TWICE DAILY (Patient taking differently: Take 50 mg by mouth 2 (two) times daily. ) 180 tablet 1  . Multiple Vitamin (MULTIVITAMIN) tablet Take 1 tablet by mouth daily.    . Omega-3 Fatty Acids (OMEGA 3 PO) Take 2 tablets by mouth every evening.     . Probiotic Product (PROBIOTIC DAILY PO) Take 1 tablet by mouth daily.    [name] telmisartan (MICARDIS) 20 MG tablet TAKE 1 TABLET BY MOUTH DAILY WITH EVENING MEAL OR AT BEDTIME 90 tablet 0  . vitamin C (ASCORBIC ACID) 500 MG tablet Take 500 mg by mouth every evening.     . NON FORMULARY Take 3 tablets by mouth 3 (three) times daily. Cholasta Care (Patient not taking: Reported on 03/01/2020)    . pantoprazole (PROTONIX) 40 MG tablet Take 1 tablet (40 mg total) by mouth daily. 30 tablet 1   Current Facility-Administered Medications on File Prior to Visit  Medication Dose Route Frequency Provider Last Rate Last Admin  . incobotulinumtoxinA (XEOMIN) 100 units injection 100 Units  100 Units Intramuscular Q90 days Levert Feinstein, MD   100 Units at 12/29/19 1523    BP 130/62   Pulse 79   Temp 98.3 F (36.8 C) (Oral)   Resp 20   Ht 4\' 11"  (1.499 m)   Wt 119 lb (54 kg)   SpO2 98%   BMI 24.04 kg/m       Objective:   Physical Exam  General Mental Status- Alert. General Appearance- Not in acute distress.   Skin General: Color- Normal Color. Moisture- Normal Moisture.  Neck Carotid Arteries- Normal color. Moisture- Normal  Moisture. No carotid bruits. No JVD.  Chest and Lung Exam Auscultation: Breath Sounds:-Normal.  Cardiovascular Auscultation:Rythm- Regular. Murmurs & Other Heart Sounds:Auscultation of the heart reveals- No Murmurs.  Abdomen Inspection:-Inspeection Normal. Palpation/Percussion:Note:No mass. Palpation and Percussion of the abdomen reveal- Non Tender, Non Distended + BS, no rebound or guarding.rt lower quadrant pain and at level of umbilicus.   Neurologic Cranial Nerve exam:-  CN III-XII intact(No nystagmus), symmetric smile. Strength:- 5/5 equal and symmetric strength both upper and lower extremities.  Back- no cva tenderness.      Assessment & Plan:  [phone]  For abd pain/rlq pain, I will get cbc, cmp and lipase stat.  Continue current treatment/augmentin.  I have ct abd/pelvis order and will try to get authorized for tomorrow. May have appendicitis, diverticulitis or stone. Other dx in differential but these are top presently.  Recommend past midnight drink only water.  Some blood in urine. Will get culture.  If pain worsens tonight or labs with emergent findings then ED evaluation tonight.  Follow up date to be determined after study review.  [name], PA-C   Time spent with patient today was 40  minutes which consisted of chart review, discussing differntial  diagnosis, work up,  Treatment, discussion of plan after hours if worsens, placing ct order, coordination with radiology  and documentation.

## 2020-03-02 ENCOUNTER — Encounter (HOSPITAL_BASED_OUTPATIENT_CLINIC_OR_DEPARTMENT_OTHER): Payer: Self-pay | Admitting: *Deleted

## 2020-03-02 ENCOUNTER — Other Ambulatory Visit: Payer: Self-pay

## 2020-03-02 ENCOUNTER — Emergency Department (HOSPITAL_BASED_OUTPATIENT_CLINIC_OR_DEPARTMENT_OTHER)
Admission: EM | Admit: 2020-03-02 | Discharge: 2020-03-02 | Disposition: A | Discharge status: Home / Self Care | Payer: Medicare Other | Attending: Emergency Medicine | Admitting: Emergency Medicine

## 2020-03-02 ENCOUNTER — Emergency Department (HOSPITAL_BASED_OUTPATIENT_CLINIC_OR_DEPARTMENT_OTHER)
Admission: RE | Admit: 2020-03-02 | Discharge: 2020-03-02 | Disposition: A | Discharge status: Home / Self Care | Payer: Medicare Other | Source: Ambulatory Visit | Attending: Medical | Admitting: Medical

## 2020-03-02 ENCOUNTER — Telehealth: Payer: Self-pay | Admitting: Medical

## 2020-03-02 ENCOUNTER — Encounter (HOSPITAL_BASED_OUTPATIENT_CLINIC_OR_DEPARTMENT_OTHER): Payer: Self-pay

## 2020-03-02 DIAGNOSIS — Z79899 Other long term (current) drug therapy: Secondary | ICD-10-CM | POA: Diagnosis not present

## 2020-03-02 DIAGNOSIS — E039 Hypothyroidism, unspecified: Secondary | ICD-10-CM | POA: Insufficient documentation

## 2020-03-02 DIAGNOSIS — Z7982 Long term (current) use of aspirin: Secondary | ICD-10-CM | POA: Insufficient documentation

## 2020-03-02 DIAGNOSIS — R1031 Right lower quadrant pain: Secondary | ICD-10-CM | POA: Insufficient documentation

## 2020-03-02 DIAGNOSIS — E119 Type 2 diabetes mellitus without complications: Secondary | ICD-10-CM | POA: Diagnosis not present

## 2020-03-02 DIAGNOSIS — I10 Essential (primary) hypertension: Secondary | ICD-10-CM | POA: Diagnosis not present

## 2020-03-02 IMAGING — CT CT ABD-PELV W/ CM
2 of 5 series · 16 of 46 positions shown, 18 images · IV contrast (Omnipaque)
Comparison: None.

CLINICAL DATA: Right-sided abdominal and flank pain.

EXAM:
CT ABDOMEN AND PELVIS WITH CONTRAST
TECHNIQUE: Multidetector CT imaging of the abdomen and pelvis was performed
using the standard protocol following bolus administration of
intravenous contrast.
CONTRAST:  100mL OMNIPAQUE IOHEXOL 300 MG/ML  SOLN

[Series 2: axial st · axial · 0.78mm/px · z∈[-412,-16]mm · 13 of 89 slices shown, 15 images]
[im 5/89  soft-tissue]
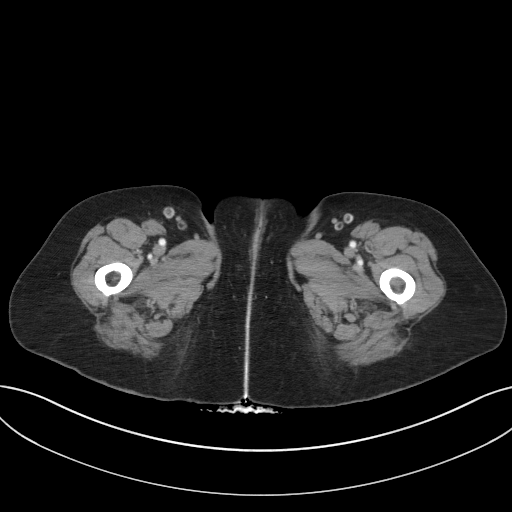
[im 5/89  bone]
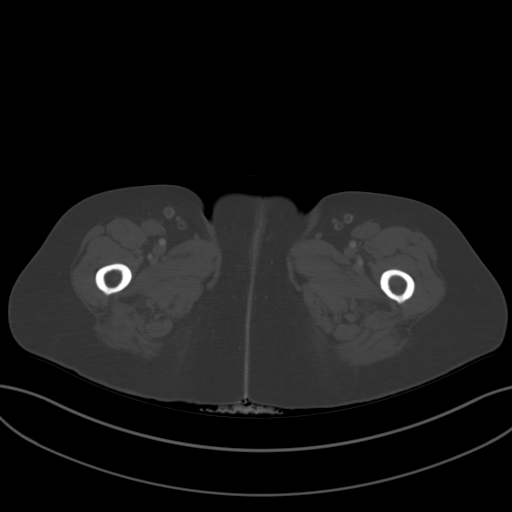
[im 14/89  soft-tissue]
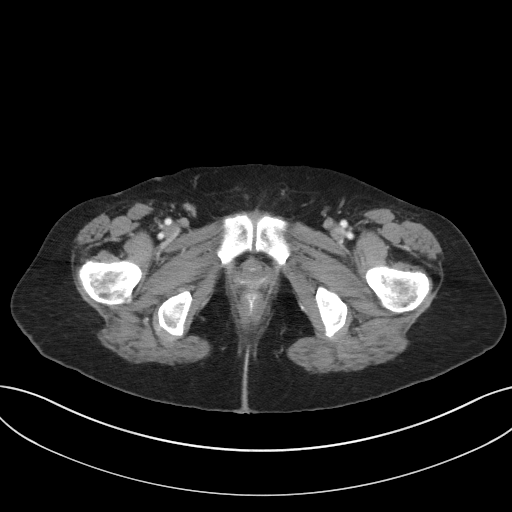
[im 19/89  soft-tissue]
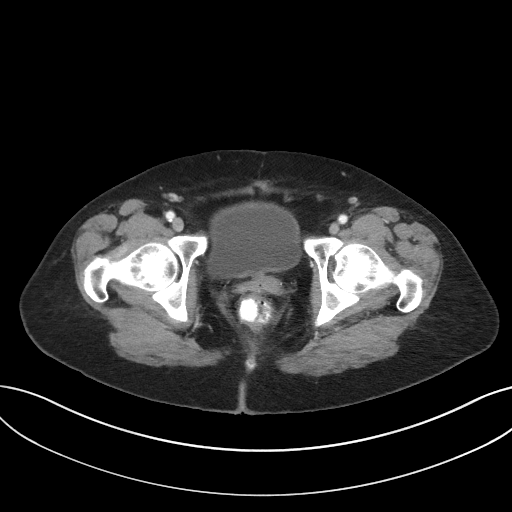
[im 24/89  soft-tissue]
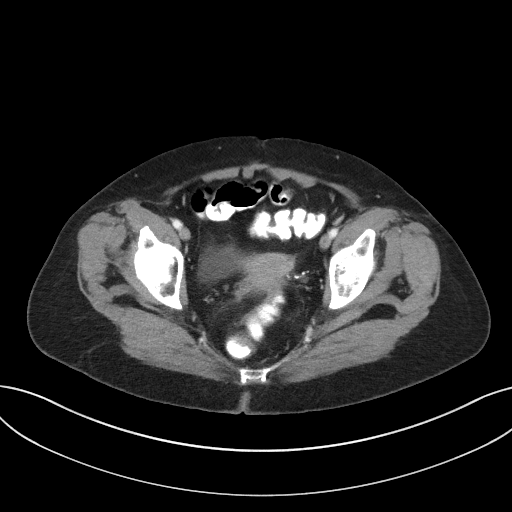
[im 33/89  soft-tissue]
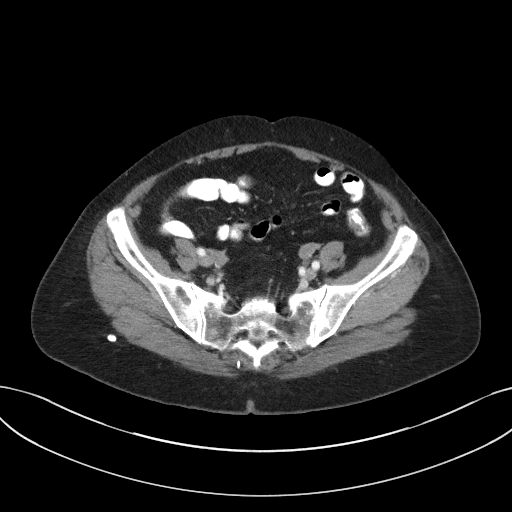
[im 38/89  soft-tissue]
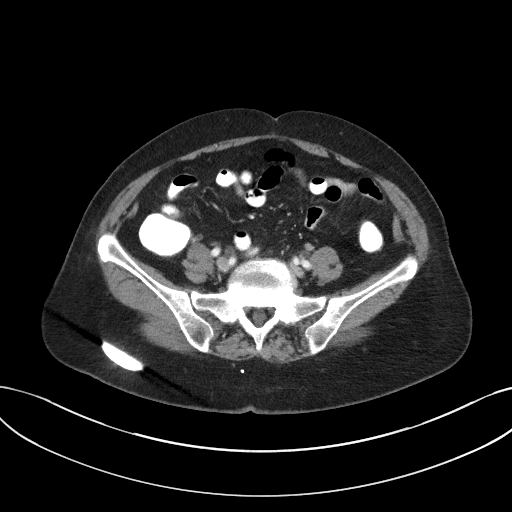
[im 47/89  soft-tissue]
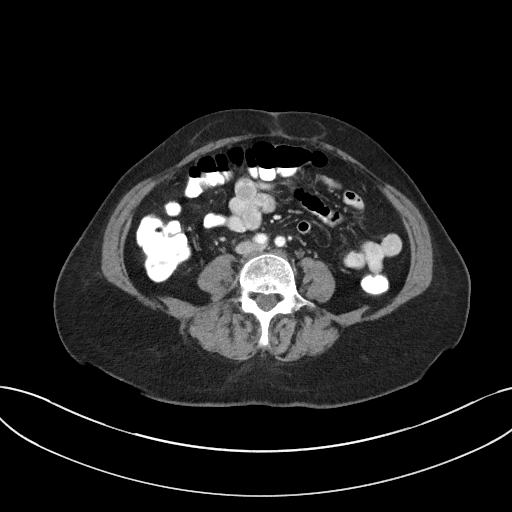
[im 51/89  soft-tissue]
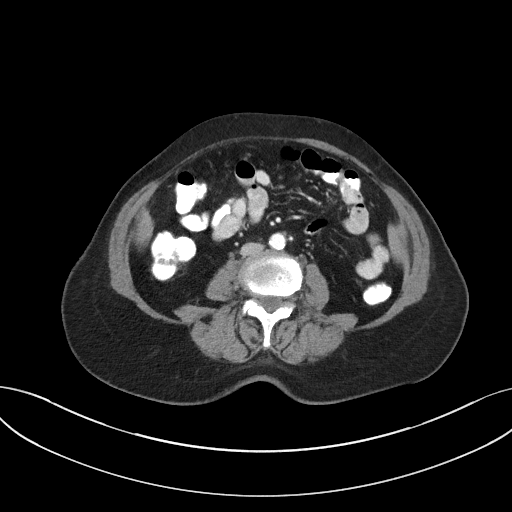
[im 56/89  soft-tissue]
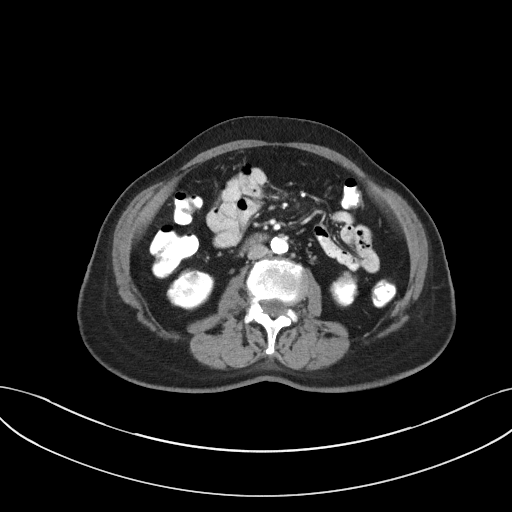
[im 56/89  bone]
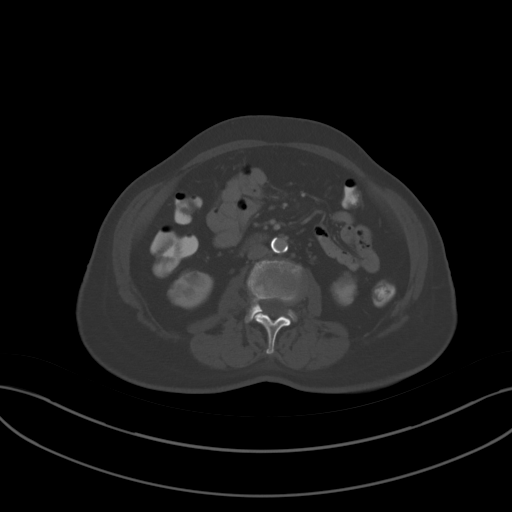
[im 65/89  soft-tissue]
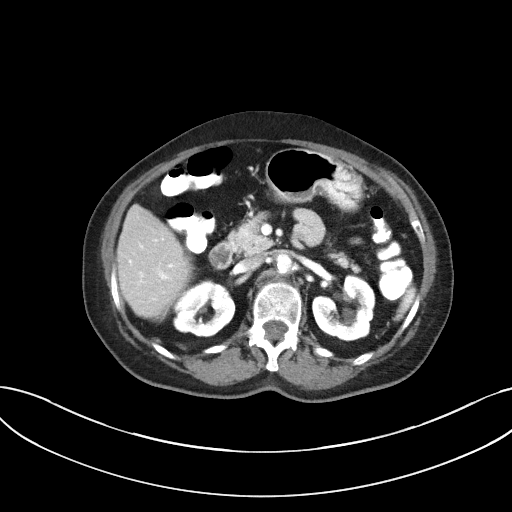
[im 70/89  soft-tissue]
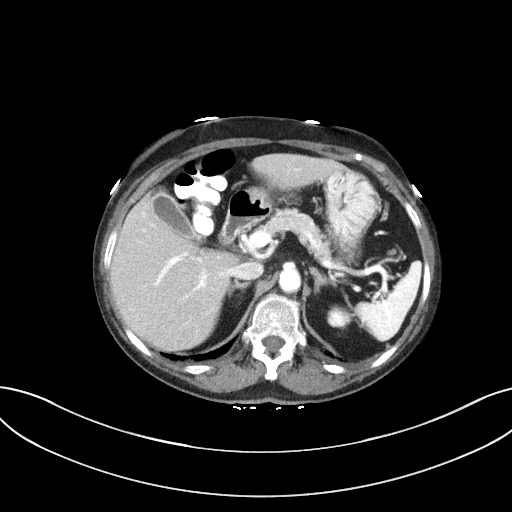
[im 75/89  soft-tissue]
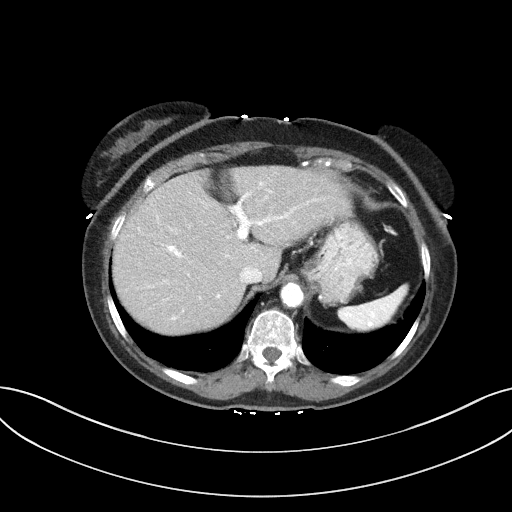
[im 84/89  soft-tissue]
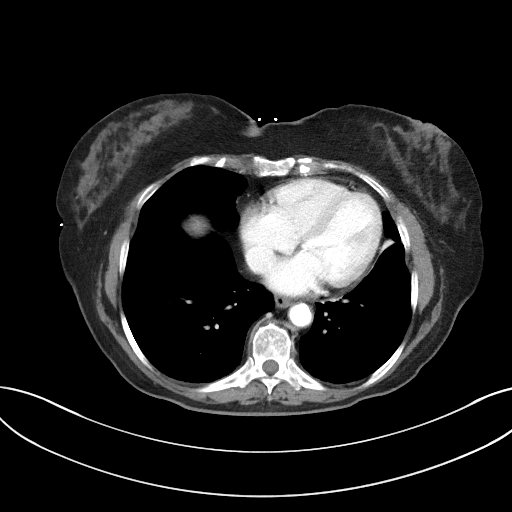

[Series 5: coronal st · coronal · 0.68mm/px · 3 of 81 slices shown]
[im 27/81  soft-tissue]
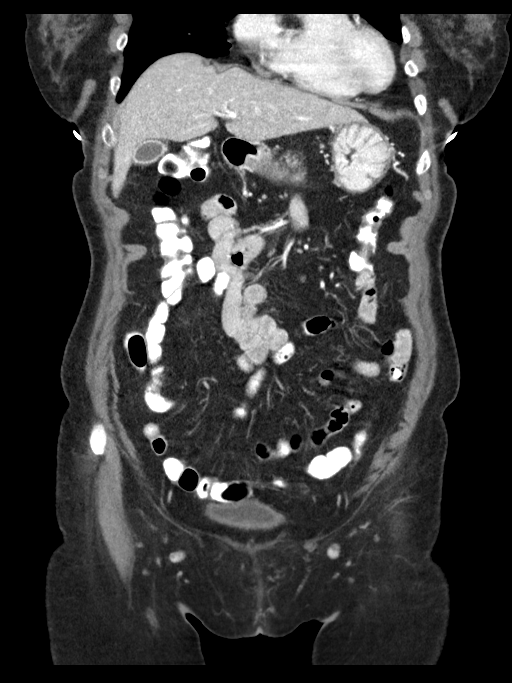
[im 36/81  soft-tissue]
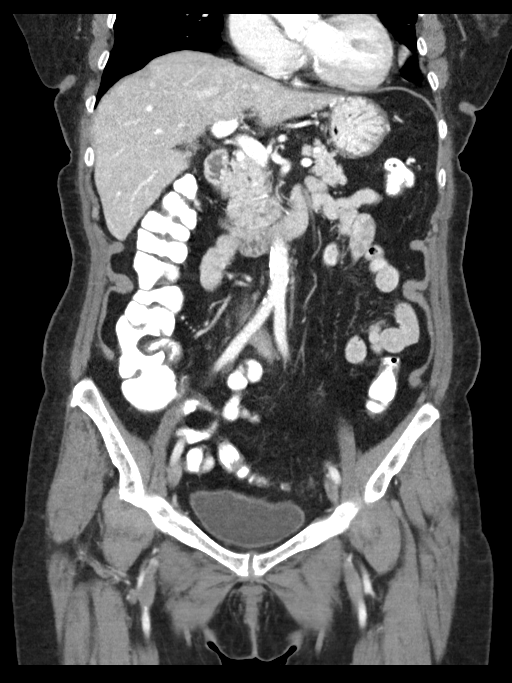
[im 45/81  soft-tissue]
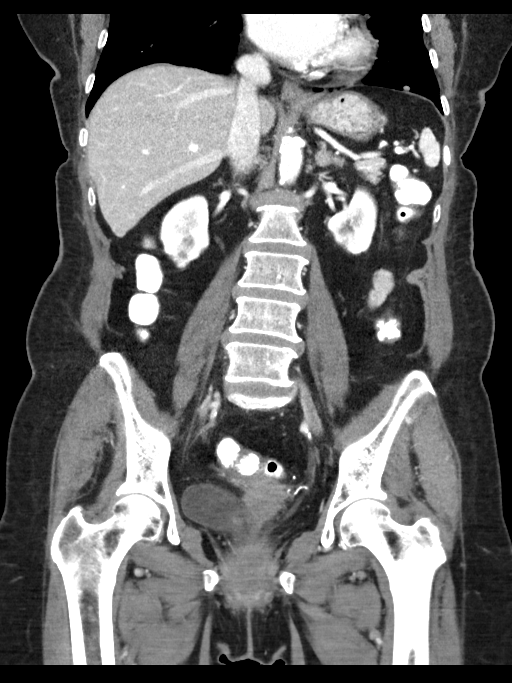

[16 of 46 positions shown; findings below may reference images not displayed]

FINDINGS: Lower chest: No acute abnormality. 5 mm nodule in the left lower
lobe (series 4, image 11).

Hepatobiliary: No focal liver abnormality is seen. Diffusely
decreased hepatic density. No gallstones, gallbladder wall
thickening, or biliary dilatation.

Pancreas: Unremarkable. No pancreatic ductal dilatation or
surrounding inflammatory changes.

Spleen: Normal in size without focal abnormality. Several calcified
granulomas.

Adrenals/Urinary Tract: The adrenal glands are unremarkable.
Parapelvic left renal cysts. Several subcentimeter low-density
lesions in the left kidney are too small to characterize. No renal
calculi or hydronephrosis. The bladder is unremarkable.

Stomach/Bowel: Stomach is within normal limits. Diminutive or absent
appendix. No evidence of bowel wall thickening, distention, or
inflammatory changes.

Vascular/Lymphatic: Aortic atherosclerosis. No enlarged abdominal or
pelvic lymph nodes.

Reproductive: Uterus and bilateral adnexa are unremarkable.

Other: No abdominal wall hernia or abnormality. No abdominopelvic
ascites. No pneumoperitoneum.

Musculoskeletal: No acute or significant osseous findings.
IMPRESSION: 1. No acute intra-abdominal process.
2. Hepatic steatosis.
3. 5 mm left lower lobe pulmonary nodule. No follow-up needed if
patient is low-risk. Non-contrast chest CT can be considered in 12
months if patient is high-risk. This recommendation follows the
consensus statement: Guidelines for Management of Incidental
Pulmonary Nodules Detected on CT Images: From the [HOSPITAL]
4. Aortic Atherosclerosis ([ED]-[ED]).

## 2020-03-02 MED ORDER — MECLIZINE HCL 12.5 MG PO TABS: 12.5000 mg | ORAL_TABLET | Freq: Three times a day (TID) | ORAL | 0 refills | Status: DC | PRN

## 2020-03-02 MED ORDER — IOHEXOL 300 MG/ML  SOLN
100.0000 mL | Freq: Once | INTRAMUSCULAR | Status: AC | PRN
  Administered 2020-03-02: 100 mL via INTRAVENOUS

## 2020-03-02 NOTE — Telephone Encounter (Signed)
Rx meclizine sent to pt pharmacy.

## 2020-03-02 NOTE — Telephone Encounter (Signed)
Thanks for update

## 2020-03-02 NOTE — ED Notes (Signed)
ED Provider at bedside. 

## 2020-03-02 NOTE — Telephone Encounter (Signed)
I sent to the imaging dept last night. Results must be in. They have pt scheduled today at 1:30pm. Thanks!

## 2020-03-02 NOTE — ED Provider Notes (Signed)
[facility] EMERGENCY DEPARTMENT Provider Note   CSN: [id number] Arrival date & time: 03/02/20  1407     History Chief Complaint  Patient presents with  . Abdominal Pain  . Diarrhea    Leslie Ward is a 86 y.o. female.  Patient is a 86 year old female who presents with abdominal pain.  She has had about a week history of pain in her right mid back that radiates to her right mid and lower abdomen.  She was seen in urgent care on July 18.  She had abdominal x-ray which showed no acute abnormalities.  X-ray of her LS spine show no acute abnormalities.  She was started on Augmentin for possible diverticulitis.  She did not have any nausea vomiting or diarrhea prior to the Augmentin but once taking the Augmentin, she started to have some loose stools.  She has been on a bland diet.  Overall she says that her symptoms have been getting better over the last 3 to 4 days.  She did see her PCP yesterday.  Due to her ongoing pain, a CT scan was ordered.  She also had blood work yesterday.  Her understanding was to come to the emergency room after she had a CT scan.          Past Medical History:  Diagnosis Date  . Dystonia   . Hypertension   . Thyroid disease     Patient Active Problem List   Diagnosis Date Noted  . Vertigo 12/01/2019  . Cervical dystonia 03/08/2019  . Hyperlipidemia 02/11/2019  . Edema 02/11/2019  . Hypertension 01/19/2019  . Type 2 diabetes mellitus (HCC) 01/19/2019  . Hypothyroidism (acquired) 01/19/2019  . Neck pain 01/19/2019  . Dystonia 01/19/2019  . Adhesive capsulitis of left shoulder 12/22/2018    Past Surgical History:  Procedure Laterality Date  . NO PAST SURGERIES       OB History   No obstetric history on file.     Family History  Problem Relation Age of Onset  . Stroke Mother   . Heart attack Father   . Heart Problems Brother     Social History   Tobacco Use  . Smoking status: Never Smoker  . Smokeless tobacco:  Never Used  Vaping Use  . Vaping Use: Never used  Substance Use Topics  . Alcohol use: No  . Drug use: No    Home Medications Prior to Admission medications   Medication Sig Start Date End Date Taking? Authorizing Provider  amLODipine (NORVASC) 5 MG tablet TAKE 1 TABLET(5 MG) BY MOUTH TWICE DAILY 12/30/19   Baldo DaubMunley, Brian J, MD  amoxicillin-clavulanate (AUGMENTIN) 875-125 MG tablet Take one tab PO Q8hr 02/27/20   Lattie HawBeese, Stephen A, MD  aspirin EC 81 MG EC tablet Take 1 tablet (81 mg total) by mouth daily. 12/02/19   Elgergawy, Leana Roeawood S, MD  Calcium Carbonate (CALCIUM 600 PO) Take 2 tablets by mouth daily.    [provider]  Calcium Polycarbophil (FIBER-CAPS PO) Take 1 capsule by mouth 2 (two) times a day.     [provider]  Cinnamon 500 MG capsule Take 1,000 mg by mouth daily.    [provider]  hydrochlorothiazide (MICROZIDE) 12.5 MG capsule Take 12.5 mg by mouth 2 (two) times a week. Monday and Friday    [provider]  levothyroxine (SYNTHROID) 50 MCG tablet Take 1 tablet (50 mcg total) by mouth daily before breakfast. 02/01/20   Saguier, Ramon DredgeEdward, PA-C  meclizine (ANTIVERT) 12.5 MG  tablet Take 1 tablet (12.5 mg total) by mouth 3 (three) times daily as needed for dizziness. 12/13/19   Saguier, Ramon Dredge, PA-C  MELATONIN PO Take 2-3 tablets by mouth at bedtime as needed (sleep).     [provider]  Menaquinone-7 (VITAMIN K2 PO) Take 1 tablet by mouth at bedtime.     [provider]  metoprolol tartrate (LOPRESSOR) 50 MG tablet TAKE 1 TABLET(50 MG) BY MOUTH TWICE DAILY Patient taking differently: Take 50 mg by mouth 2 (two) times daily.  09/29/19   Saguier, Ramon Dredge, PA-C  Multiple Vitamin (MULTIVITAMIN) tablet Take 1 tablet by mouth daily.    [provider]  NON FORMULARY Take 3 tablets by mouth 3 (three) times daily. Cholasta Care Patient not taking: Reported on 03/01/2020    [provider]  Omega-3 Fatty Acids (OMEGA 3 PO)  Take 2 tablets by mouth every evening.     [provider]  pantoprazole (PROTONIX) 40 MG tablet Take 1 tablet (40 mg total) by mouth daily. 12/01/19 12/31/19  Elgergawy, Leana Roe, MD  Probiotic Product (PROBIOTIC DAILY PO) Take 1 tablet by mouth daily.    [provider]  telmisartan (MICARDIS) 20 MG tablet TAKE 1 TABLET BY MOUTH DAILY WITH EVENING MEAL OR AT BEDTIME 12/22/19   Baldo Daub, MD  vitamin C (ASCORBIC ACID) 500 MG tablet Take 500 mg by mouth every evening.     [provider]    Allergies    Crestor [rosuvastatin calcium]  Review of Systems   Review of Systems  Constitutional: Negative for chills, diaphoresis, fatigue and fever.  HENT: Negative for congestion, rhinorrhea and sneezing.   Eyes: Negative.   Respiratory: Negative for cough, chest tightness and shortness of breath.   Cardiovascular: Negative for chest pain and leg swelling.  Gastrointestinal: Positive for abdominal pain and diarrhea (Loose stools). Negative for blood in stool, nausea and vomiting.  Genitourinary: Negative for difficulty urinating, flank pain, frequency and hematuria.  Musculoskeletal: Negative for arthralgias and back pain.  Skin: Negative for rash.  Neurological: Negative for dizziness, speech difficulty, weakness, numbness and headaches.    Physical Exam Updated Vital Signs BP (!) 146/63   Pulse 69   Temp 98 F (36.7 C) (Oral)   Resp 20   Ht 4\' 11"  (1.499 m)   Wt 54 kg   SpO2 99%   BMI 24.04 kg/m   Physical Exam Constitutional:      Appearance: She is well-developed.  HENT:     Head: Normocephalic and atraumatic.  Eyes:     Pupils: Pupils are equal, round, and reactive to light.  Cardiovascular:     Rate and Rhythm: Normal rate and regular rhythm.     Heart sounds: Normal heart sounds.  Pulmonary:     Effort: Pulmonary effort is normal. No respiratory distress.     Breath sounds: Normal breath sounds. No wheezing or rales.  Chest:     Chest  wall: No tenderness.  Abdominal:     General: Bowel sounds are normal.     Palpations: Abdomen is soft.     Tenderness: There is abdominal tenderness in the right lower quadrant and periumbilical area. There is no guarding or rebound.     Comments: Tenderness in the right mid back, no spinal tenderness, tenderness in the right flank and right lower abdomen, no rashes  Musculoskeletal:        General: Normal range of motion.     Cervical back: Normal range  of motion and neck supple.  Lymphadenopathy:     Cervical: No cervical adenopathy.  Skin:    General: Skin is warm and dry.     Findings: No rash.  Neurological:     Mental Status: She is alert and oriented to person, place, and time.     ED Results / Procedures / Treatments   Labs (all labs ordered are listed, but only abnormal results are displayed) Labs Reviewed - No data to display  EKG None  Radiology CT Abdomen Pelvis W Contrast  Result Date: 03/02/2020 CLINICAL DATA:  Right-sided abdominal and flank pain. EXAM: CT ABDOMEN AND PELVIS WITH CONTRAST TECHNIQUE: Multidetector CT imaging of the abdomen and pelvis was performed using the standard protocol following bolus administration of intravenous contrast. CONTRAST:  OMNIPAQUE IOHEXOL 300 MG/ML  SOLN COMPARISON:  None. FINDINGS: Lower chest: No acute abnormality. 5 mm nodule in the left lower lobe (series 4, image 11). Hepatobiliary: No focal liver abnormality is seen. Diffusely decreased hepatic density. No gallstones, gallbladder wall thickening, or biliary dilatation. Pancreas: Unremarkable. No pancreatic ductal dilatation or surrounding inflammatory changes. Spleen: Normal in size without focal abnormality. Several calcified granulomas. Adrenals/Urinary Tract: The adrenal glands are unremarkable. Parapelvic left renal cysts. Several subcentimeter low-density lesions in the left kidney are too small to characterize. No renal calculi or hydronephrosis. The bladder is  unremarkable. Stomach/Bowel: Stomach is within normal limits. Diminutive or absent appendix. No evidence of bowel wall thickening, distention, or inflammatory changes. Vascular/Lymphatic: Aortic atherosclerosis. No enlarged abdominal or pelvic lymph nodes. Reproductive: Uterus and bilateral adnexa are unremarkable. Other: No abdominal wall hernia or abnormality. No abdominopelvic ascites. No pneumoperitoneum. Musculoskeletal: No acute or significant osseous findings. IMPRESSION: 1. No acute intra-abdominal process. 2. Hepatic steatosis. 3. 5 mm left lower lobe pulmonary nodule. No follow-up needed if patient is low-risk. Non-contrast chest CT can be considered in 12 months if patient is high-risk. This recommendation follows the consensus statement: Guidelines for Management of Incidental Pulmonary Nodules Detected on CT Images: From the Fleischner Society 2017; Radiology 2017; 284:228-243. 4. Aortic Atherosclerosis (ICD10-I70.0). Electronically Signed   By: Obie Dredge M.D.   On: 03/02/2020 14:45    Procedures Procedures (including critical care time)  Medications Ordered in ED Medications - No data to display  ED Course  I have reviewed the triage vital signs and the nursing notes.  Pertinent labs & imaging results that were available during my care of the patient were reviewed by me and considered in my medical decision making (see chart for details).    MDM Rules/Calculators/A&P                          Patient is a 86 year old female who presents with abdominal pain.  Her abdominal pain seems to be improving.  She still has some tenderness but overall she says she has been getting a little bit better every day.  She has been on a brat diet.  I reviewed her CT scan that she had earlier today.  There is no acute abnormalities.  There is a pulmonary nodule.  No evidence of gallbladder disease.  No evidence of appendicitis.  Her labs from yesterday were reviewed and are nonconcerning.  I did  not feel that with her improving symptoms these need to be repeated.  I did attempt to contact her PCP, [name].  Unfortunately, he is out of the office this afternoon.  I spoke to Dr. Rogelia Rohrer.  She  also reviewed the patient's records and did not see anything else that was concerning.  Given the patient's improving symptoms, will discharge home.  She will finish the course of Augmentin.  She will follow-up with her PCP as needed and I did advise her and the son that if there is any worsening symptoms over the weekend, to return here for reevaluation.  She did have a pulmonary nodule that her PCP will follow up on. Final Clinical Impression(s) / ED Diagnoses Final diagnoses:  Right lower quadrant abdominal pain    Rx / DC Orders ED Discharge Orders    None       Rolan Bucco, MD 03/02/20 1630

## 2020-03-02 NOTE — ED Triage Notes (Signed)
Abdominal pain and diarrhea. She was seen at [facility] in [place] for the pain. She was started on Augmentin for possible Diverticulitis.

## 2020-03-03 LAB — URINE CULTURE
MICRO NUMBER:: 10737348
Result:: NO GROWTH
SPECIMEN QUALITY:: ADEQUATE

## 2020-03-03 NOTE — Telephone Encounter (Signed)
Emergency Room MD called and reported patient was in ER and she was feeling better. After review of labs and CT scan patient was sent home to follow up with PMD.

## 2020-03-07 ENCOUNTER — Other Ambulatory Visit: Payer: Self-pay | Admitting: *Deleted

## 2020-03-10 ENCOUNTER — Encounter: Payer: Self-pay | Admitting: Medical

## 2020-03-10 ENCOUNTER — Ambulatory Visit (HOSPITAL_BASED_OUTPATIENT_CLINIC_OR_DEPARTMENT_OTHER)
Admission: RE | Admit: 2020-03-10 | Discharge: 2020-03-10 | Disposition: A | Discharge status: Home / Self Care | Payer: Medicare Other | Source: Ambulatory Visit | Attending: Medical | Admitting: Medical

## 2020-03-10 ENCOUNTER — Other Ambulatory Visit: Payer: Self-pay

## 2020-03-10 ENCOUNTER — Ambulatory Visit (INDEPENDENT_AMBULATORY_CARE_PROVIDER_SITE_OTHER): Payer: Medicare Other | Admitting: Medical

## 2020-03-10 VITALS — BP 145/59 | HR 72 | Resp 18 | Ht 59.0 in | Wt 119.2 lb

## 2020-03-10 DIAGNOSIS — R109 Unspecified abdominal pain: Secondary | ICD-10-CM

## 2020-03-10 DIAGNOSIS — B49 Unspecified mycosis: Secondary | ICD-10-CM

## 2020-03-10 DIAGNOSIS — K6289 Other specified diseases of anus and rectum: Secondary | ICD-10-CM

## 2020-03-10 DIAGNOSIS — K649 Unspecified hemorrhoids: Secondary | ICD-10-CM

## 2020-03-10 IMAGING — DX DG ABDOMEN 1V
2 series · 2 of 2 positions shown · non-contrast
Comparison: Abdominal radiograph dated [DATE]

CLINICAL DATA: 89-year-old female with abdominal pain.

EXAM:
ABDOMEN - 1 VIEW

[abdomen kub (1 of 2)]
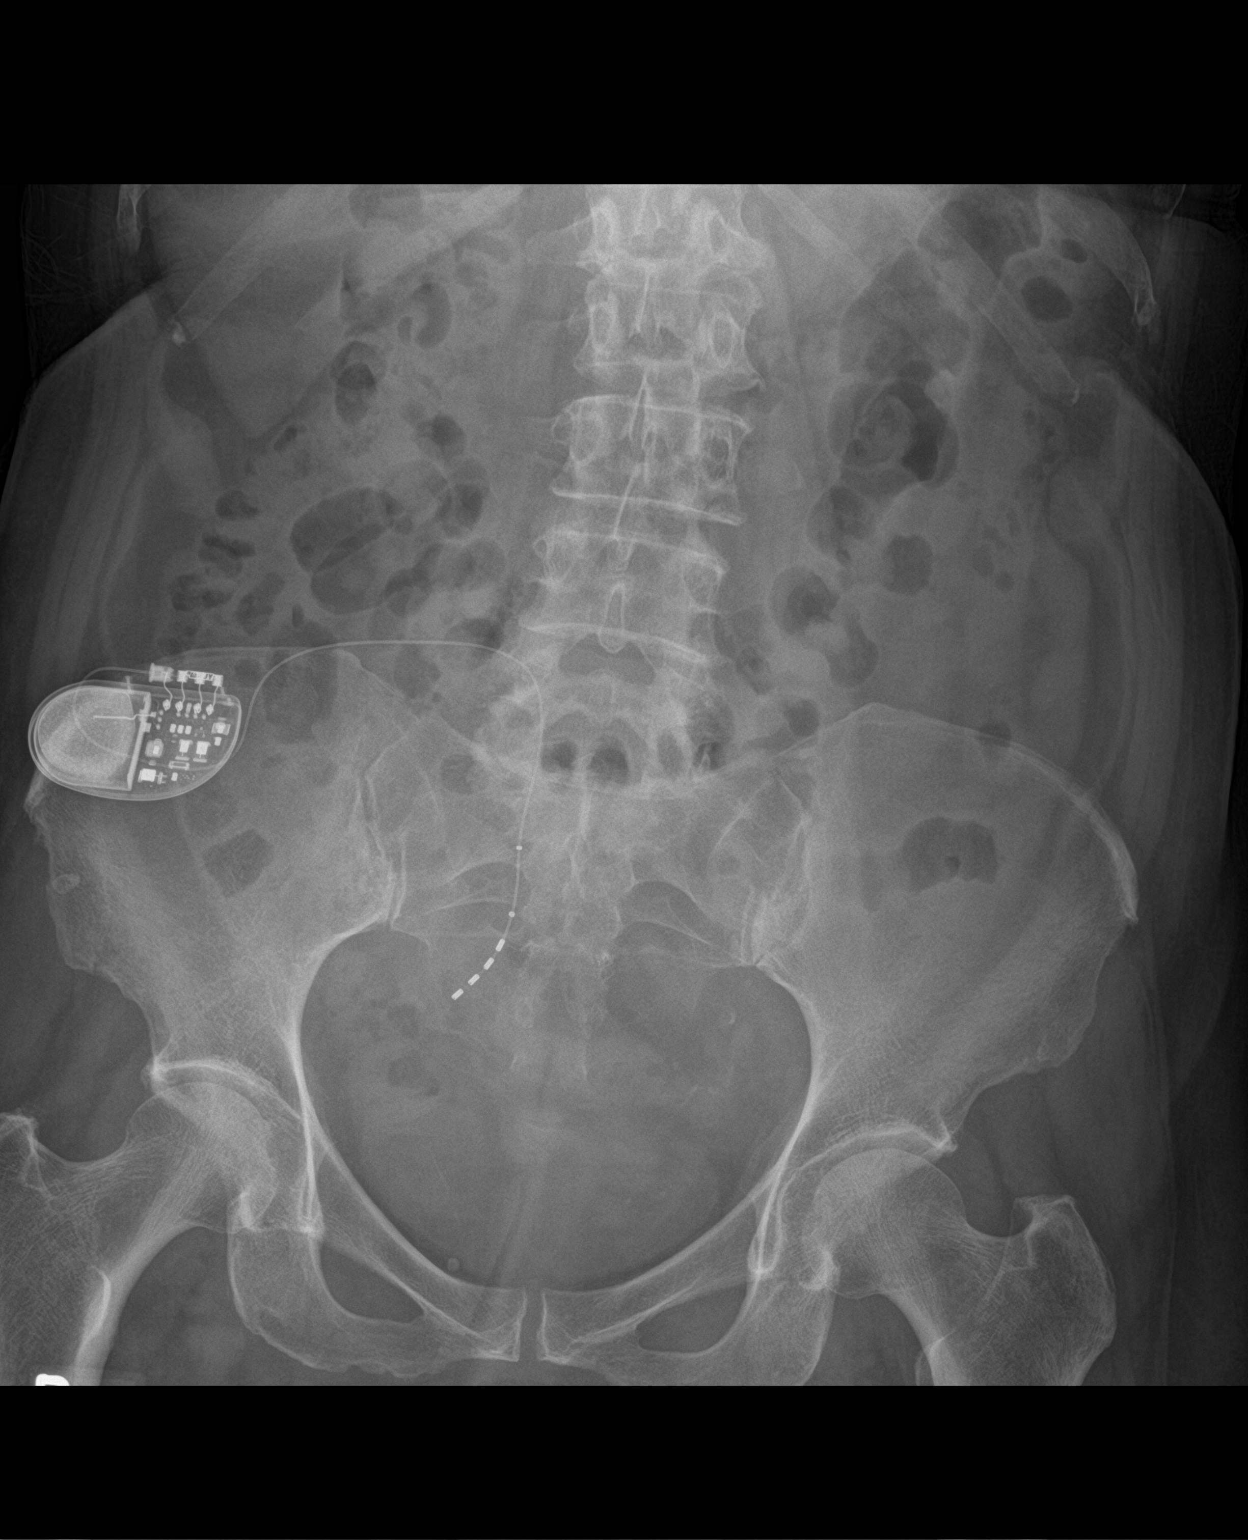

[abdomen kub (2 of 2)]
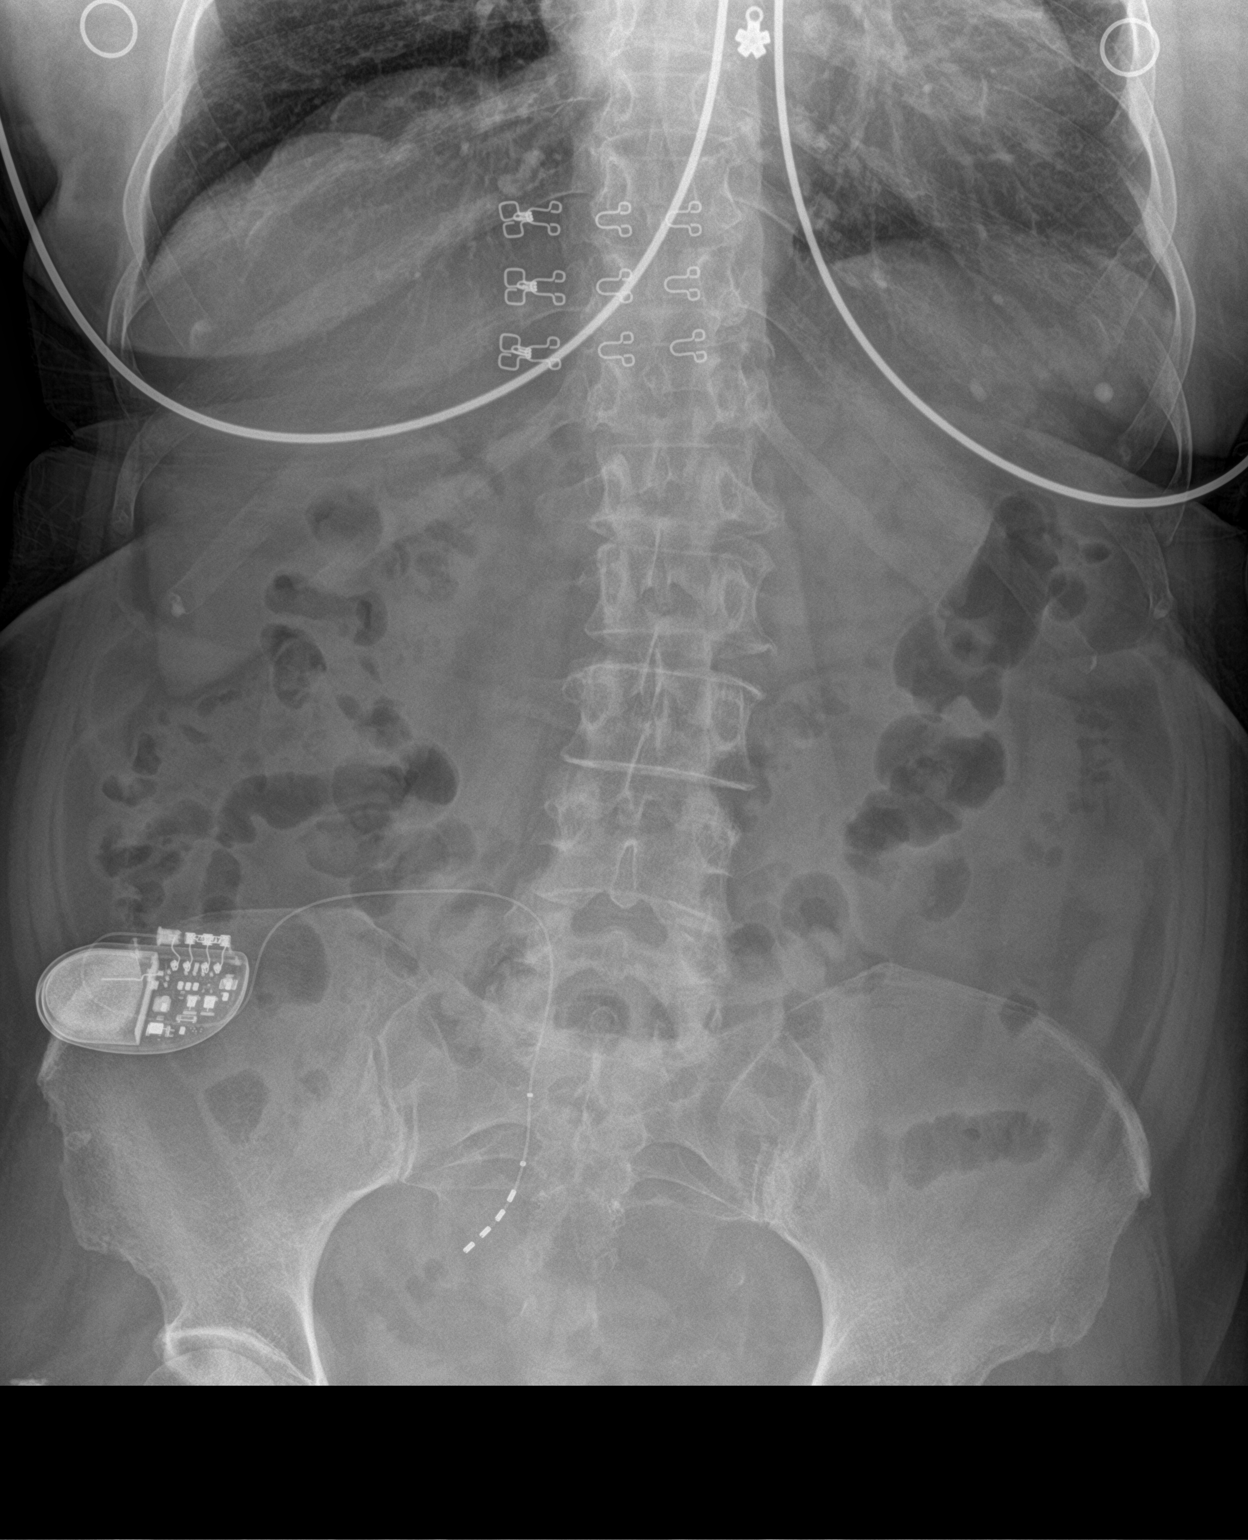

[2 of 2 positions shown; findings below may reference images not displayed]

FINDINGS: There is no bowel dilatation or evidence of obstruction. No free air
or radiopaque calculi. Probable small splenic granuloma. Stimulator
device over the right lower quadrant. Degenerative changes of spine.
No acute osseous pathology.
IMPRESSION: Negative.

## 2020-03-10 MED ORDER — HYDROCORTISONE ACETATE 25 MG RE SUPP: 25.0000 mg | Freq: Two times a day (BID) | RECTAL | 0 refills | Status: DC

## 2020-03-10 MED ORDER — NYSTATIN 100000 UNIT/GM EX CREA: 1.0000 "application " | TOPICAL_CREAM | Freq: Two times a day (BID) | CUTANEOUS | 0 refills | Status: DC

## 2020-03-10 NOTE — Progress Notes (Signed)
Subjective:    Patient ID: Leslie Ward, female    DOB: 27-Oct-1930, 86 y.o.   MRN: [id number]  HPI  Pt in for follow up.  Now pt states her pain is much less rt side.. Pt has had 2 ED visits in past. Pt had 2 ct negative for appendicitis/acute finding. Those ct and ED visit reviewed today  Pt states sometimes has to push hard to have bm but not complaining of constipation. But does not report hard small. States small smooth stools. Actually descibes some rectal pain, burning and some itching.  No nausea, no vomiting, no fever, no chills, no sweats and no decreased appetite.  Pt went to ED past Thursday.       Review of Systems  Constitutional: Negative for chills, fatigue and fever.  Respiratory: Negative for cough, chest tightness, shortness of breath and wheezing.   Cardiovascular: Negative for chest pain and palpitations.  Gastrointestinal: Positive for abdominal pain and rectal pain. Negative for abdominal distention, anal bleeding, blood in stool, constipation, nausea and vomiting.  Genitourinary: Negative for dysuria.  Musculoskeletal: Negative for back pain, joint swelling and neck stiffness.  Skin: Negative for rash.  Neurological: Negative for dizziness and light-headedness.  Hematological: Negative for adenopathy. Does not bruise/bleed easily.  Psychiatric/Behavioral: Negative for behavioral problems, confusion, dysphoric mood and suicidal ideas. The patient is not nervous/anxious.    Past Medical History:  Diagnosis Date  . Dystonia   . Hypertension   . Thyroid disease      Social History   Socioeconomic History  . Marital status: Widowed    Spouse name: Not on file  . Number of children: 1  . Years of education: 6 or 7 years of education  . Highest education level: Not on file  Occupational History  . Occupation: Retired  Tobacco Use  . Smoking status: Never Smoker  . Smokeless tobacco: Never Used  Vaping Use  . Vaping Use: Never used  Substance  and Sexual Activity  . Alcohol use: No  . Drug use: No  . Sexual activity: Not Currently  Other Topics Concern  . Not on file  Social History Narrative   Lives alone.   Right-handed.   No caffeine use.   Social Determinants of Health   Financial Resource Strain:   . Difficulty of Paying Living Expenses:   Food Insecurity:   . Worried About Programme researcher, broadcasting/film/video in the Last Year:   . Barista in the Last Year:   Transportation Needs:   . Freight forwarder (Medical):   [name] Lack of Transportation (Non-Medical):   Physical Activity:   . Days of Exercise per Week:   . Minutes of Exercise per Session:   Stress:   . Feeling of Stress :   Social Connections:   . Frequency of Communication with Friends and Family:   . Frequency of Social Gatherings with Friends and Family:   . Attends Religious Services:   . Active Member of Clubs or Organizations:   . Attends Banker Meetings:   [name] Marital Status:   Intimate Partner Violence:   . Fear of Current or Ex-Partner:   . Emotionally Abused:   [name] Physically Abused:   . Sexually Abused:     Past Surgical History:  Procedure Laterality Date  . NO PAST SURGERIES      Family History  Problem Relation Age of Onset  . Stroke Mother   . Heart attack Father   .  Heart Problems Brother     Allergies  Allergen Reactions  . Crestor [Rosuvastatin Calcium] Other (See Comments)    "caused me to go into kidney failure"    Current Outpatient Medications on File Prior to Visit  Medication Sig Dispense Refill  . amLODipine (NORVASC) 5 MG tablet TAKE 1 TABLET(5 MG) BY MOUTH TWICE DAILY 180 tablet 1  . amoxicillin-clavulanate (AUGMENTIN) 875-125 MG tablet Take one tab PO Q8hr (Patient not taking: Reported on 03/10/2020) 21 tablet 0  . aspirin EC 81 MG EC tablet Take 1 tablet (81 mg total) by mouth daily. 30 tablet 0  . Calcium Carbonate (CALCIUM 600 PO) Take 2 tablets by mouth daily.    . Calcium Polycarbophil (FIBER-CAPS  PO) Take 1 capsule by mouth 2 (two) times a day.     . Cinnamon 500 MG capsule Take 1,000 mg by mouth daily.    . hydrochlorothiazide (MICROZIDE) 12.5 MG capsule Take 12.5 mg by mouth 2 (two) times a week. Monday and Friday    . levothyroxine (SYNTHROID) 50 MCG tablet Take 1 tablet (50 mcg total) by mouth daily before breakfast. 90 tablet 1  . meclizine (ANTIVERT) 12.5 MG tablet Take 1 tablet (12.5 mg total) by mouth 3 (three) times daily as needed for dizziness. 30 tablet 0  . MELATONIN PO Take 2-3 tablets by mouth at bedtime as needed (sleep).     . Menaquinone-7 (VITAMIN K2 PO) Take 1 tablet by mouth at bedtime.     . metoprolol tartrate (LOPRESSOR) 50 MG tablet TAKE 1 TABLET(50 MG) BY MOUTH TWICE DAILY (Patient taking differently: Take 50 mg by mouth 2 (two) times daily. ) 180 tablet 1  . Multiple Vitamin (MULTIVITAMIN) tablet Take 1 tablet by mouth daily.    . NON FORMULARY Take 3 tablets by mouth 3 (three) times daily. Cholasta Care (Patient not taking: Reported on 03/01/2020)    . Omega-3 Fatty Acids (OMEGA 3 PO) Take 2 tablets by mouth every evening.     . pantoprazole (PROTONIX) 40 MG tablet Take 1 tablet (40 mg total) by mouth daily. 30 tablet 1  . Probiotic Product (PROBIOTIC DAILY PO) Take 1 tablet by mouth daily.    [name] telmisartan (MICARDIS) 20 MG tablet TAKE 1 TABLET BY MOUTH DAILY WITH EVENING MEAL OR AT BEDTIME 90 tablet 0  . vitamin C (ASCORBIC ACID) 500 MG tablet Take 500 mg by mouth every evening.      Current Facility-Administered Medications on File Prior to Visit  Medication Dose Route Frequency Provider Last Rate Last Admin  . incobotulinumtoxinA (XEOMIN) 100 units injection 100 Units  100 Units Intramuscular Q90 days Levert Feinstein, MD   100 Units at 12/29/19 1523    BP (!) 145/59   Pulse 72   Resp 18   Ht 4\' 11"  (1.499 m)   Wt 119 lb 3.2 oz (54.1 kg)   SpO2 100%   BMI 24.08 kg/m       Objective:   Physical Exam  General- No acute distress. Pleasant  patient. Neck- Full range of motion, no jvd Lungs- Clear, even and unlabored. Heart- regular rate and rhythm. Neurologic- CNII- XII grossly intact. Abdomen-soft, on faint rt lower quadrant tenderness, nd, +bs, no rebound or guarding. +bs. Back- no cva tenderess.  Rectal- at 9 oclock position 2 small, tender, nonthrombosed hemorroids. She has reddish pink area to both buttock areas. Patient depends is very wet. No breakdown of skin/no ulcer.      Assessment & Plan:  Your  abdomen pain is much less/improving over past 2 weeks. Work up in ED twice did not reveal cause. Will repeat xray to evaluate if any abnormal stool burden. If any found then give med as discussed.  On exam rectal pain likely hemorrhoid related. Use anusol hc suppository.  For fungal infection rx nystatin cream. Keep area as dry as possible and change depends frequently.   Refer to GI for recent abdomen pain.  Follow up 2 weeks or as needed.  [name], PA-C   Time spent with patient today was 40  minutes which consisted of chart review, discussing diagnoses, work up, explaining  Treatments, answering referrals, placing referrals  and documentation.

## 2020-03-10 NOTE — Patient Instructions (Addendum)
Your abdomen pain is much less/improving over past 2 weeks. Work up in ED twice did not reveal cause. Will repeat xray to evaluate if any abnormal stool burden. If any found then give med as discussed.  On exam rectal pain likely hemorrhoid related. Use anusol hc suppository.  For fungal infection rx nystatin cream. Keep area as dry as possible and change depends frequently.   Refer to GI for recent abdomen pain.  Follow up 2 weeks or as needed.

## 2020-03-11 ENCOUNTER — Encounter: Payer: Self-pay | Admitting: Medical

## 2020-03-13 ENCOUNTER — Telehealth: Payer: Self-pay | Admitting: Medical

## 2020-03-13 MED ORDER — HYDROCORTISONE (PERIANAL) 1 % EX CREA: TOPICAL_CREAM | CUTANEOUS | 0 refills | Status: DC

## 2020-03-13 NOTE — Telephone Encounter (Signed)
Hydrocortisone cream sent in place of annusol hc.

## 2020-03-15 DIAGNOSIS — N3946 Mixed incontinence: Secondary | ICD-10-CM | POA: Insufficient documentation

## 2020-03-15 DIAGNOSIS — N3941 Urge incontinence: Secondary | ICD-10-CM | POA: Insufficient documentation

## 2020-03-15 HISTORY — DX: Mixed incontinence: N39.46

## 2020-03-15 HISTORY — DX: Urge incontinence: N39.41

## 2020-03-22 ENCOUNTER — Emergency Department (HOSPITAL_BASED_OUTPATIENT_CLINIC_OR_DEPARTMENT_OTHER)
Admission: EM | Admit: 2020-03-22 | Discharge: 2020-03-23 | Disposition: A | Discharge status: Home / Self Care | Payer: Medicare Other | Attending: Emergency Medicine | Admitting: Emergency Medicine

## 2020-03-22 ENCOUNTER — Encounter (HOSPITAL_BASED_OUTPATIENT_CLINIC_OR_DEPARTMENT_OTHER): Payer: Self-pay

## 2020-03-22 ENCOUNTER — Other Ambulatory Visit: Payer: Self-pay

## 2020-03-22 ENCOUNTER — Emergency Department (HOSPITAL_BASED_OUTPATIENT_CLINIC_OR_DEPARTMENT_OTHER): Payer: Medicare Other

## 2020-03-22 DIAGNOSIS — Z79899 Other long term (current) drug therapy: Secondary | ICD-10-CM | POA: Insufficient documentation

## 2020-03-22 DIAGNOSIS — R202 Paresthesia of skin: Secondary | ICD-10-CM | POA: Diagnosis present

## 2020-03-22 DIAGNOSIS — E876 Hypokalemia: Secondary | ICD-10-CM | POA: Insufficient documentation

## 2020-03-22 DIAGNOSIS — M542 Cervicalgia: Secondary | ICD-10-CM

## 2020-03-22 DIAGNOSIS — E041 Nontoxic single thyroid nodule: Secondary | ICD-10-CM | POA: Diagnosis not present

## 2020-03-22 DIAGNOSIS — I6782 Cerebral ischemia: Secondary | ICD-10-CM | POA: Insufficient documentation

## 2020-03-22 DIAGNOSIS — Z8249 Family history of ischemic heart disease and other diseases of the circulatory system: Secondary | ICD-10-CM | POA: Insufficient documentation

## 2020-03-22 DIAGNOSIS — E039 Hypothyroidism, unspecified: Secondary | ICD-10-CM | POA: Diagnosis not present

## 2020-03-22 DIAGNOSIS — I10 Essential (primary) hypertension: Secondary | ICD-10-CM | POA: Insufficient documentation

## 2020-03-22 DIAGNOSIS — E119 Type 2 diabetes mellitus without complications: Secondary | ICD-10-CM | POA: Insufficient documentation

## 2020-03-22 DIAGNOSIS — Z7982 Long term (current) use of aspirin: Secondary | ICD-10-CM | POA: Insufficient documentation

## 2020-03-22 DIAGNOSIS — R519 Headache, unspecified: Secondary | ICD-10-CM | POA: Diagnosis not present

## 2020-03-22 DIAGNOSIS — E785 Hyperlipidemia, unspecified: Secondary | ICD-10-CM | POA: Diagnosis not present

## 2020-03-22 DIAGNOSIS — R200 Anesthesia of skin: Secondary | ICD-10-CM

## 2020-03-22 DIAGNOSIS — J323 Chronic sphenoidal sinusitis: Secondary | ICD-10-CM | POA: Insufficient documentation

## 2020-03-22 DIAGNOSIS — Q278 Other specified congenital malformations of peripheral vascular system: Secondary | ICD-10-CM | POA: Insufficient documentation

## 2020-03-22 LAB — COMPREHENSIVE METABOLIC PANEL
ALT: 13 U/L (ref 0–44)
AST: 17 U/L (ref 15–41)
Albumin: 3.6 g/dL (ref 3.5–5.0)
Alkaline Phosphatase: 55 U/L (ref 38–126)
Anion gap: 12 (ref 5–15)
BUN: 13 mg/dL (ref 8–23)
CO2: 22 mmol/L (ref 22–32)
Calcium: 8.8 mg/dL — ABNORMAL LOW (ref 8.9–10.3)
Chloride: 102 mmol/L (ref 98–111)
Creatinine, Ser: 0.71 mg/dL (ref 0.44–1.00)
GFR calc Af Amer: >60 mL/min (ref >60)
GFR calc non Af Amer: >60 mL/min (ref >60)
Glucose, Bld: 131 mg/dL — ABNORMAL HIGH (ref 70–99)
Potassium: 3.3 mmol/L — ABNORMAL LOW (ref 3.5–5.1)
Sodium: 136 mmol/L (ref 135–145)
Total Bilirubin: 1.2 mg/dL (ref 0.3–1.2)
Total Protein: 7.6 g/dL (ref 6.5–8.1)

## 2020-03-22 LAB — CBC WITH DIFFERENTIAL/PLATELET
Abs Immature Granulocytes: 0.05 10*3/uL (ref 0.00–0.07)
Basophils Absolute: 0 10*3/uL (ref 0.0–0.1)
Basophils Relative: 0 %
Eosinophils Absolute: 0.1 10*3/uL (ref 0.0–0.5)
Eosinophils Relative: 0 %
HCT: 38.1 % (ref 36.0–46.0)
Hemoglobin: 12.4 g/dL (ref 12.0–15.0)
Immature Granulocytes: 0 %
Lymphocytes Relative: 28 %
Lymphs Abs: 3.2 10*3/uL (ref 0.7–4.0)
MCH: 29.9 pg (ref 26.0–34.0)
MCHC: 32.5 g/dL (ref 30.0–36.0)
MCV: 91.8 fL (ref 80.0–100.0)
Monocytes Absolute: 0.8 10*3/uL (ref 0.1–1.0)
Monocytes Relative: 7 %
Neutro Abs: 7.3 10*3/uL (ref 1.7–7.7)
Neutrophils Relative %: 65 %
Platelets: 255 10*3/uL (ref 150–400)
RBC: 4.15 MIL/uL (ref 3.87–5.11)
RDW: 13.3 % (ref 11.5–15.5)
WBC: 11.4 10*3/uL — ABNORMAL HIGH (ref 4.0–10.5)
nRBC: 0 % (ref 0.0–0.2)

## 2020-03-22 LAB — PROTIME-INR
INR: 1 (ref 0.8–1.2)
Prothrombin Time: 13 seconds (ref 11.4–15.2)

## 2020-03-22 IMAGING — CT CT ANGIO HEAD
1 of 11 series · 5 of 33 positions shown · IV contrast (Omnipaque)
Comparison: Prior CTA from [DATE].

CLINICAL DATA: Initial evaluation for right-sided numbness and
pain.

EXAM:
CT ANGIOGRAPHY HEAD AND NECK
TECHNIQUE: Multidetector CT imaging of the head and neck was performed using
the standard protocol during bolus administration of intravenous
contrast. Multiplanar CT image reconstructions and MIPs were
obtained to evaluate the vascular anatomy. Carotid stenosis
measurements (when applicable) are obtained utilizing NASCET
criteria, using the distal internal carotid diameter as the
denominator.
CONTRAST:  100mL OMNIPAQUE IOHEXOL 350 MG/ML SOLN

[Series 11: axial thin · axial · 0.43mm/px · z∈[+818,+998]mm · 5 of 271 slices shown]
[im 46/271  soft-tissue]
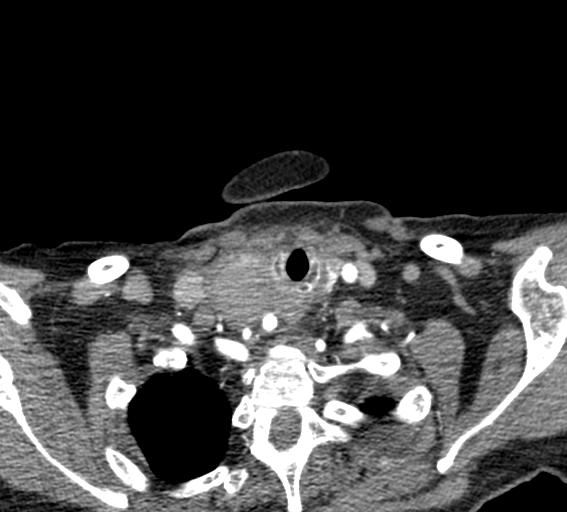
[im 91/271  bone]
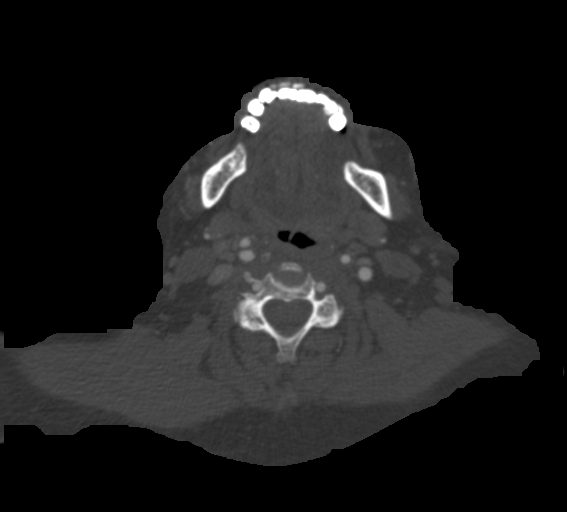
[im 136/271  soft-tissue]
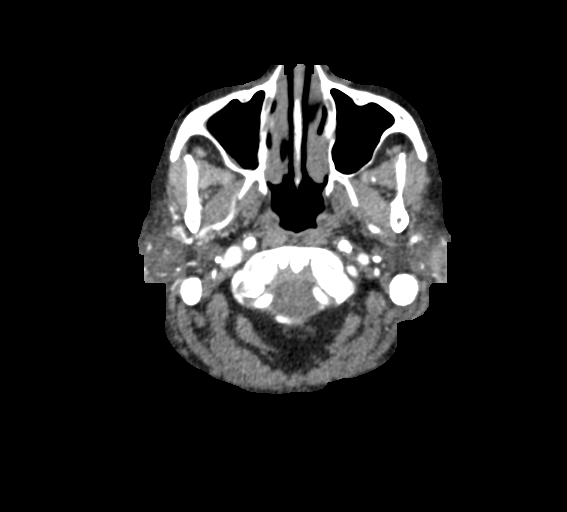
[im 181/271  bone]
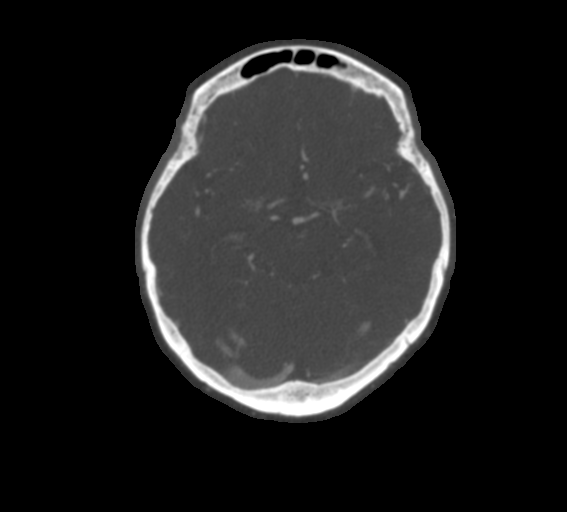
[im 226/271  soft-tissue]
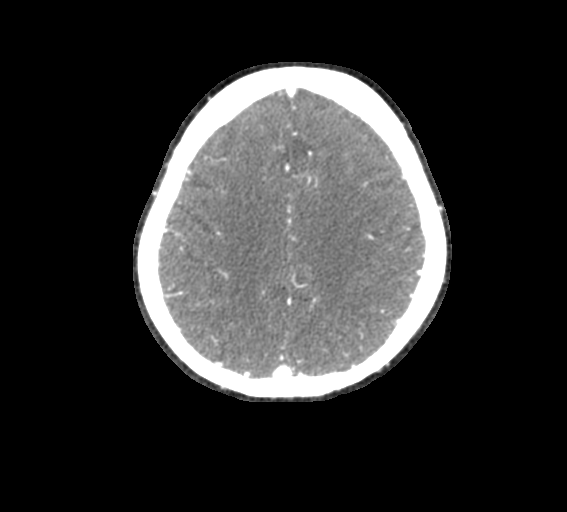

[5 of 33 positions shown; findings below may reference images not displayed]

FINDINGS: CT HEAD FINDINGS

Brain: Generalized age-related cerebral atrophy with moderate
chronic microvascular ischemic disease. Remote lacunar infarcts
involving the right basal ganglia and thalamus noted. No acute
intracranial hemorrhage. No acute large vessel territory infarct. No
mass lesion, midline shift or mass effect. No hydrocephalus or
extra-axial fluid collection.

Vascular: No hyperdense vessel. Scattered vascular calcifications
noted within the carotid siphons.

Skull: Scalp soft tissues and calvarium within normal limits.

Sinuses: Moderate layering opacity noted within the left greater
than right sphenoid sinuses. Mild mucosal thickening noted within
the right maxillary sinus. No mastoid effusion.

Orbits: Globes and orbital soft tissues demonstrate no acute
finding.

CTA NECK FINDINGS

Aortic arch: Visualized aortic arch of normal caliber. Aberrant
right subclavian artery again noted. Mild atheromatous change within
the arch itself. No hemodynamically significant stenosis seen about
the origin of the great vessels.

Right carotid system: Right common and internal carotid arteries
remain widely patent without stenosis, dissection or occlusion.

Left carotid system: Left common and internal carotid arteries
remain widely patent without stenosis, dissection or occlusion. A
centric calcified plaque noted at the distal cervical left ICA just
prior to the skull base without significant stenosis, stable.

Vertebral arteries: Both vertebral arteries arise from the
subclavian arteries. No proximal subclavian artery stenosis. Soft
plaque at the origin of the left vertebral artery with associated
mild-to-moderate stenosis, stable.

Skeleton: No acute osseous abnormality. No discrete or worrisome
osseous lesions.

Other neck: Previously noted right thyroid nodule appears somewhat
increased in size measuring 2.7 cm on today's exam, previously
cm. Additionally, the surrounding right lobe of thyroid is somewhat
edematous and inflamed in appearance, with hazy stranding within the
adjacent fat. Finding raises the possibility for acute thyroiditis.
The left lobe of thyroid is hypoplastic and/or absent. No other
acute soft tissue abnormality within the neck.

Upper chest: Visualized upper chest demonstrates no acute finding.

Review of the MIP images confirms the above findings

CTA HEAD FINDINGS

Anterior circulation: Petrous segments widely patent bilaterally.
Mild atheromatous change within the carotid siphons without
hemodynamically significant stenosis. A1 segments widely patent.
Normal anterior communicating artery complex. Anterior cerebral
arteries patent to their distal aspects without stenosis. No M1
stenosis or occlusion. Normal MCA bifurcations. Distal MCA branches
remain well perfused and symmetric.

Posterior circulation: Both vertebral arteries patent to the
vertebrobasilar junction without stenosis. Patent right PICA. Left
PICA not seen. Basilar widely patent to its distal aspect without
stenosis. Superior cerebellar and posterior cerebral arteries well
perfused bilaterally.

Venous sinuses: Patent allowing for timing the contrast bolus.

Anatomic variants: None significant.  No intracranial aneurysm.

Review of the MIP images confirms the above findings
IMPRESSION: CT HEAD IMPRESSION:

1. No acute intracranial abnormality.
2. Generalized age-related cerebral atrophy with moderate chronic
microvascular ischemic disease, with a few scattered remote lacunar
infarcts about the right basal ganglia and right thalamus.
3. Left greater than right sphenoid sinusitis.

CTA HEAD AND NECK IMPRESSION:

1. Stable CTA of the head and neck.  No large vessel occlusion.
2. Mild to moderate atheromatous narrowing at the origin of the left
vertebral artery, stable. Additional mild for age scattered
atheromatous change about the major arterial vasculature of the head
and neck. No other hemodynamically significant or correctable
stenosis.
3. Aberrant right subclavian artery.
4. Apparent interval enlargement of previously identified right
thyroid nodule, now measuring 2.7 cm, with hazy inflammatory changes
within the surrounding right thyroid lobe and adjacent soft tissues.
Findings raise the possibility for acute thyroiditis. Correlation
with laboratory values and symptomatology recommended. Additionally,
given the apparent interval change, follow-up examination and
reassessment with dedicated thyroid ultrasound recommended for
complete characterization.

## 2020-03-22 IMAGING — CT CT ANGIO NECK
1 of 13 series · 3 of 33 positions shown · IV contrast (omnipaque)
Comparison: Prior CTA from [DATE].

CLINICAL DATA: Initial evaluation for right-sided numbness and
pain.

EXAM:
CT ANGIOGRAPHY HEAD AND NECK
TECHNIQUE: Multidetector CT imaging of the head and neck was performed using
the standard protocol during bolus administration of intravenous
contrast. Multiplanar CT image reconstructions and MIPs were
obtained to evaluate the vascular anatomy. Carotid stenosis
measurements (when applicable) are obtained utilizing NASCET
criteria, using the distal internal carotid diameter as the
denominator.
CONTRAST:  100mL OMNIPAQUE IOHEXOL 350 MG/ML SOLN

[Series 11: axial thin · axial · 0.43mm/px · z∈[+773,+1043]mm · 3 of 271 slices shown]
[im 1/271  soft-tissue]
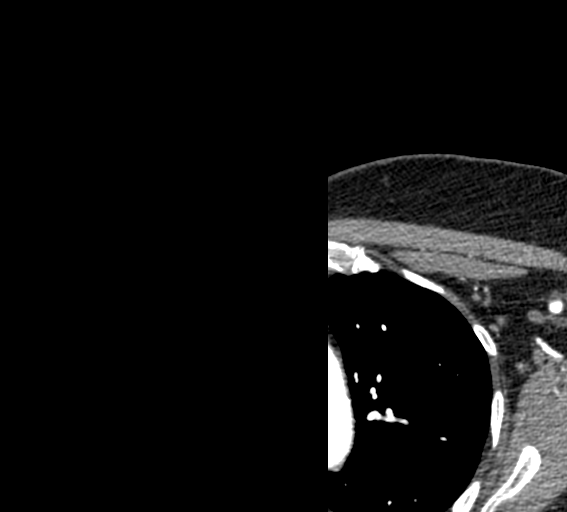
[im 136/271  bone]
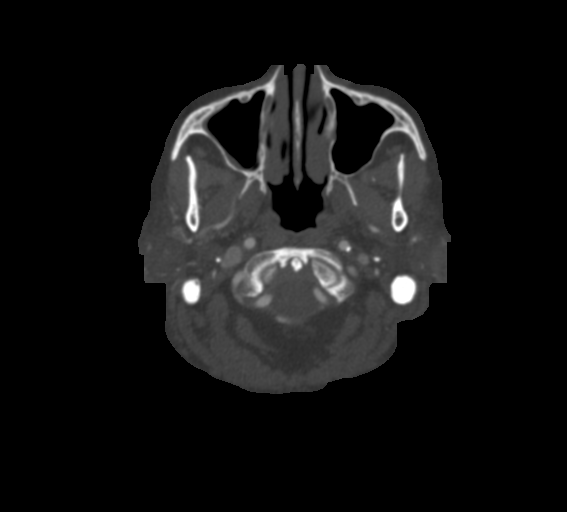
[im 271/271  soft-tissue]
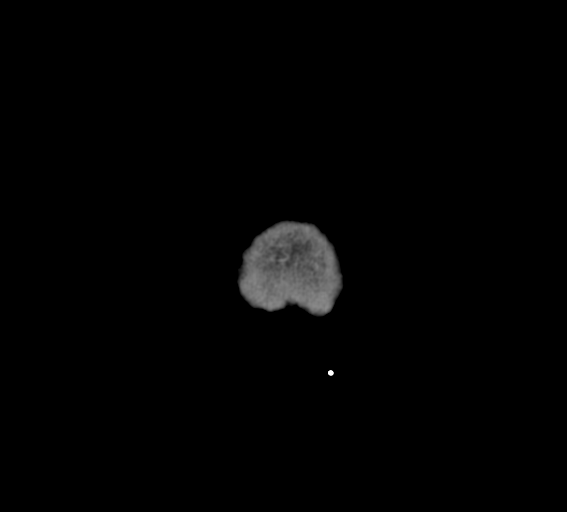

[3 of 33 positions shown; findings below may reference images not displayed]

FINDINGS: CT HEAD FINDINGS

Brain: Generalized age-related cerebral atrophy with moderate
chronic microvascular ischemic disease. Remote lacunar infarcts
involving the right basal ganglia and thalamus noted. No acute
intracranial hemorrhage. No acute large vessel territory infarct. No
mass lesion, midline shift or mass effect. No hydrocephalus or
extra-axial fluid collection.

Vascular: No hyperdense vessel. Scattered vascular calcifications
noted within the carotid siphons.

Skull: Scalp soft tissues and calvarium within normal limits.

Sinuses: Moderate layering opacity noted within the left greater
than right sphenoid sinuses. Mild mucosal thickening noted within
the right maxillary sinus. No mastoid effusion.

Orbits: Globes and orbital soft tissues demonstrate no acute
finding.

CTA NECK FINDINGS

Aortic arch: Visualized aortic arch of normal caliber. Aberrant
right subclavian artery again noted. Mild atheromatous change within
the arch itself. No hemodynamically significant stenosis seen about
the origin of the great vessels.

Right carotid system: Right common and internal carotid arteries
remain widely patent without stenosis, dissection or occlusion.

Left carotid system: Left common and internal carotid arteries
remain widely patent without stenosis, dissection or occlusion. A
centric calcified plaque noted at the distal cervical left ICA just
prior to the skull base without significant stenosis, stable.

Vertebral arteries: Both vertebral arteries arise from the
subclavian arteries. No proximal subclavian artery stenosis. Soft
plaque at the origin of the left vertebral artery with associated
mild-to-moderate stenosis, stable.

Skeleton: No acute osseous abnormality. No discrete or worrisome
osseous lesions.

Other neck: Previously noted right thyroid nodule appears somewhat
increased in size measuring 2.7 cm on today's exam, previously
cm. Additionally, the surrounding right lobe of thyroid is somewhat
edematous and inflamed in appearance, with hazy stranding within the
adjacent fat. Finding raises the possibility for acute thyroiditis.
The left lobe of thyroid is hypoplastic and/or absent. No other
acute soft tissue abnormality within the neck.

Upper chest: Visualized upper chest demonstrates no acute finding.

Review of the MIP images confirms the above findings

CTA HEAD FINDINGS

Anterior circulation: Petrous segments widely patent bilaterally.
Mild atheromatous change within the carotid siphons without
hemodynamically significant stenosis. A1 segments widely patent.
Normal anterior communicating artery complex. Anterior cerebral
arteries patent to their distal aspects without stenosis. No M1
stenosis or occlusion. Normal MCA bifurcations. Distal MCA branches
remain well perfused and symmetric.

Posterior circulation: Both vertebral arteries patent to the
vertebrobasilar junction without stenosis. Patent right PICA. Left
PICA not seen. Basilar widely patent to its distal aspect without
stenosis. Superior cerebellar and posterior cerebral arteries well
perfused bilaterally.

Venous sinuses: Patent allowing for timing the contrast bolus.

Anatomic variants: None significant.  No intracranial aneurysm.

Review of the MIP images confirms the above findings
IMPRESSION: CT HEAD IMPRESSION:

1. No acute intracranial abnormality.
2. Generalized age-related cerebral atrophy with moderate chronic
microvascular ischemic disease, with a few scattered remote lacunar
infarcts about the right basal ganglia and right thalamus.
3. Left greater than right sphenoid sinusitis.

CTA HEAD AND NECK IMPRESSION:

1. Stable CTA of the head and neck.  No large vessel occlusion.
2. Mild to moderate atheromatous narrowing at the origin of the left
vertebral artery, stable. Additional mild for age scattered
atheromatous change about the major arterial vasculature of the head
and neck. No other hemodynamically significant or correctable
stenosis.
3. Aberrant right subclavian artery.
4. Apparent interval enlargement of previously identified right
thyroid nodule, now measuring 2.7 cm, with hazy inflammatory changes
within the surrounding right thyroid lobe and adjacent soft tissues.
Findings raise the possibility for acute thyroiditis. Correlation
with laboratory values and symptomatology recommended. Additionally,
given the apparent interval change, follow-up examination and
reassessment with dedicated thyroid ultrasound recommended for
complete characterization.

## 2020-03-22 MED ORDER — LORAZEPAM 2 MG/ML IJ SOLN
1.0000 mg | Freq: Once | INTRAMUSCULAR | Status: AC
  Administered 2020-03-22: 1 mg via INTRAVENOUS
  Filled 2020-03-22: qty 1

## 2020-03-22 MED ORDER — IOHEXOL 350 MG/ML SOLN
100.0000 mL | Freq: Once | INTRAVENOUS | Status: AC
  Administered 2020-03-22: 100 mL via INTRAVENOUS

## 2020-03-22 NOTE — ED Triage Notes (Signed)
Per pt and son -pt c/o pain to left side of face, ear, throat, eye tongue-sx started yesterday-denies injury to pain site-NAD-slow gait to triage

## 2020-03-22 NOTE — ED Provider Notes (Signed)
[facility] EMERGENCY DEPARTMENT Provider Note   CSN: [id number] Arrival date & time: 03/22/20  1647     History Chief Complaint  Patient presents with  . Facial Pain    Leslie Ward is a 86 y.o. female.  The history is provided by the patient, medical records and a relative. The history is limited by the condition of the patient and a language barrier. No language interpreter was used.  Neurologic Problem This is a new problem. The current episode started 12 to 24 hours ago. The problem occurs constantly. The problem has not changed since onset.Associated symptoms include headaches. Pertinent negatives include no chest pain, no abdominal pain and no shortness of breath. Nothing aggravates the symptoms. Nothing relieves the symptoms. She has tried nothing for the symptoms. The treatment provided no relief.       Past Medical History:  Diagnosis Date  . Dystonia   . Hypertension   . Thyroid disease     Patient Active Problem List   Diagnosis Date Noted  . Vertigo 12/01/2019  . Cervical dystonia 03/08/2019  . Hyperlipidemia 02/11/2019  . Edema 02/11/2019  . Hypertension 01/19/2019  . Type 2 diabetes mellitus (HCC) 01/19/2019  . Hypothyroidism (acquired) 01/19/2019  . Neck pain 01/19/2019  . Dystonia 01/19/2019  . Adhesive capsulitis of left shoulder 12/22/2018    Past Surgical History:  Procedure Laterality Date  . NO PAST SURGERIES       OB History   No obstetric history on file.     Family History  Problem Relation Age of Onset  . Stroke Mother   . Heart attack Father   . Heart Problems Brother     Social History   Tobacco Use  . Smoking status: Never Smoker  . Smokeless tobacco: Never Used  Vaping Use  . Vaping Use: Never used  Substance Use Topics  . Alcohol use: No  . Drug use: No    Home Medications Prior to Admission medications   Medication Sig Start Date End Date Taking? Authorizing Provider  amLODipine (NORVASC) 5 MG  tablet TAKE 1 TABLET(5 MG) BY MOUTH TWICE DAILY 12/30/19   Baldo DaubMunley, Brian J, MD  amoxicillin-clavulanate (AUGMENTIN) 875-125 MG tablet Take one tab PO Q8hr Patient not taking: Reported on 03/10/2020 02/27/20   Lattie HawBeese, Stephen A, MD  aspirin EC 81 MG EC tablet Take 1 tablet (81 mg total) by mouth daily. 12/02/19   Elgergawy, Leana Roeawood S, MD  Calcium Carbonate (CALCIUM 600 PO) Take 2 tablets by mouth daily.    [provider]  Calcium Polycarbophil (FIBER-CAPS PO) Take 1 capsule by mouth 2 (two) times a day.     [provider]  Cinnamon 500 MG capsule Take 1,000 mg by mouth daily.    [provider]  hydrochlorothiazide (MICROZIDE) 12.5 MG capsule Take 12.5 mg by mouth 2 (two) times a week. Monday and Friday    [provider]  Hydrocortisone, Perianal, 1 % CREA Thin film twice daily 03/13/20   Saguier, Ramon DredgeEdward, PA-C  levothyroxine (SYNTHROID) 50 MCG tablet Take 1 tablet (50 mcg total) by mouth daily before breakfast. 02/01/20   Saguier, Ramon DredgeEdward, PA-C  meclizine (ANTIVERT) 12.5 MG tablet Take 1 tablet (12.5 mg total) by mouth 3 (three) times daily as needed for dizziness. 03/02/20   Saguier, Ramon DredgeEdward, PA-C  MELATONIN PO Take 2-3 tablets by mouth at bedtime as needed (sleep).     [provider]  Menaquinone-7 (VITAMIN K2 PO) Take 1 tablet by mouth at  bedtime.     [provider]  metoprolol tartrate (LOPRESSOR) 50 MG tablet TAKE 1 TABLET(50 MG) BY MOUTH TWICE DAILY Patient taking differently: Take 50 mg by mouth 2 (two) times daily.  09/29/19   Saguier, Ramon Dredge, PA-C  Multiple Vitamin (MULTIVITAMIN) tablet Take 1 tablet by mouth daily.    [provider]  NON FORMULARY Take 3 tablets by mouth 3 (three) times daily. Cholasta Care Patient not taking: Reported on 03/01/2020    [provider]  nystatin cream (MYCOSTATIN) Apply 1 application topically 2 (two) times daily. 03/10/20   Saguier, Ramon Dredge, PA-C  Omega-3 Fatty Acids (OMEGA 3 PO) Take 2 tablets  by mouth every evening.     [provider]  pantoprazole (PROTONIX) 40 MG tablet Take 1 tablet (40 mg total) by mouth daily. 12/01/19 12/31/19  Elgergawy, Leana Roe, MD  Probiotic Product (PROBIOTIC DAILY PO) Take 1 tablet by mouth daily.    [provider]  telmisartan (MICARDIS) 20 MG tablet TAKE 1 TABLET BY MOUTH DAILY WITH EVENING MEAL OR AT BEDTIME 12/22/19   Baldo Daub, MD  vitamin C (ASCORBIC ACID) 500 MG tablet Take 500 mg by mouth every evening.     [provider]    Allergies    Crestor [rosuvastatin calcium]  Review of Systems   Review of Systems  Constitutional: Positive for fatigue. Negative for chills, diaphoresis and fever.  HENT: Negative for congestion and facial swelling.   Eyes: Negative for photophobia, pain, itching and visual disturbance.  Respiratory: Negative for cough, chest tightness, shortness of breath and wheezing.   Cardiovascular: Negative for chest pain, palpitations and leg swelling.  Gastrointestinal: Negative for abdominal pain, constipation, diarrhea, nausea and vomiting.  Genitourinary: Negative for flank pain, frequency and pelvic pain.  Musculoskeletal: Positive for neck pain. Negative for back pain and neck stiffness.  Skin: Negative for rash and wound.  Neurological: Positive for numbness and headaches. Negative for dizziness, facial asymmetry, speech difficulty, weakness and light-headedness.  Psychiatric/Behavioral: Negative for agitation.  All other systems reviewed and are negative.   Physical Exam Updated Vital Signs BP (!) 173/70 (BP Location: Right Arm)   Pulse (!) 107   Temp 98.9 F (37.2 C) (Oral)   Resp 18   Wt 54.4 kg   SpO2 97%   BMI 24.24 kg/m   Physical Exam Vitals and nursing note reviewed.  Constitutional:      General: She is not in acute distress.    Appearance: She is well-developed. She is not ill-appearing, toxic-appearing or diaphoretic.  HENT:     Head: Normocephalic and  atraumatic.     Right Ear: External ear normal.     Left Ear: External ear normal.     Nose: Nose normal. No congestion or rhinorrhea.     Mouth/Throat:     Mouth: Mucous membranes are moist.     Pharynx: No oropharyngeal exudate or posterior oropharyngeal erythema.  Eyes:     Extraocular Movements: Extraocular movements intact.     Conjunctiva/sclera: Conjunctivae normal.     Pupils: Pupils are equal, round, and reactive to light.  Neck:     Vascular: No carotid bruit.   Cardiovascular:     Rate and Rhythm: Normal rate.     Pulses: Normal pulses.     Heart sounds: No murmur heard.   Pulmonary:     Effort: No respiratory distress.     Breath sounds: No stridor. No wheezing, rhonchi or rales.  Chest:  Chest wall: No tenderness.  Abdominal:     General: Abdomen is flat. There is no distension.     Tenderness: There is no abdominal tenderness. There is no right CVA tenderness, left CVA tenderness or rebound.  Musculoskeletal:        General: Tenderness present.     Cervical back: Normal range of motion and neck supple. Tenderness present. No rigidity. Muscular tenderness present. No spinous process tenderness.     Right lower leg: No edema.     Left lower leg: No edema.  Lymphadenopathy:     Cervical: No cervical adenopathy.  Skin:    General: Skin is warm.     Capillary Refill: Capillary refill takes less than 2 seconds.     Coloration: Skin is not pale.     Findings: No bruising, erythema or rash.  Neurological:     Mental Status: She is alert and oriented to person, place, and time.     GCS: GCS eye subscore is 4. GCS verbal subscore is 5. GCS motor subscore is 6.     Cranial Nerves: No dysarthria or facial asymmetry.     Sensory: Sensory deficit present.     Motor: Tremor present. No weakness, abnormal muscle tone or seizure activity.     Coordination: Coordination normal. Finger-Nose-Finger Test normal.     Deep Tendon Reflexes: Reflexes are normal and symmetric.      Comments: Numbness in right face and right tongue compared to left side.  Normal extraocular movements.  Pupils symmetric and reactive.  Clear speech.  Symmetric smile.  Psychiatric:        Mood and Affect: Mood normal.     ED Results / Procedures / Treatments   Labs (all labs ordered are listed, but only abnormal results are displayed) Labs Reviewed  CBC WITH DIFFERENTIAL/PLATELET - Abnormal; Notable for the following components:      Result Value   WBC 11.4 (*)    All other components within normal limits  COMPREHENSIVE METABOLIC PANEL - Abnormal; Notable for the following components:   Potassium 3.3 (*)    Glucose, Bld 131 (*)    Calcium 8.8 (*)    All other components within normal limits  PROTIME-INR  TSH  T4, FREE  T3, FREE    EKG None  Radiology CT Angio Head W or Wo Contrast  Result Date: 03/22/2020 CLINICAL DATA:  Initial evaluation for right-sided numbness and pain. EXAM: CT ANGIOGRAPHY HEAD AND NECK TECHNIQUE: Multidetector CT imaging of the head and neck was performed using the standard protocol during bolus administration of intravenous contrast. Multiplanar CT image reconstructions and MIPs were obtained to evaluate the vascular anatomy. Carotid stenosis measurements (when applicable) are obtained utilizing NASCET criteria, using the distal internal carotid diameter as the denominator. CONTRAST:  OMNIPAQUE IOHEXOL 350 MG/ML SOLN COMPARISON:  Prior CTA from 11/30/2019. FINDINGS: CT HEAD FINDINGS Brain: Generalized age-related cerebral atrophy with moderate chronic microvascular ischemic disease. Remote lacunar infarcts involving the right basal ganglia and thalamus noted. No acute intracranial hemorrhage. No acute large vessel territory infarct. No mass lesion, midline shift or mass effect. No hydrocephalus or extra-axial fluid collection. Vascular: No hyperdense vessel. Scattered vascular calcifications noted within the carotid siphons. Skull: Scalp soft tissues  and calvarium within normal limits. Sinuses: Moderate layering opacity noted within the left greater than right sphenoid sinuses. Mild mucosal thickening noted within the right maxillary sinus. No mastoid effusion. Orbits: Globes and orbital soft tissues demonstrate no acute finding. CTA NECK  FINDINGS Aortic arch: Visualized aortic arch of normal caliber. Aberrant right subclavian artery again noted. Mild atheromatous change within the arch itself. No hemodynamically significant stenosis seen about the origin of the great vessels. Right carotid system: Right common and internal carotid arteries remain widely patent without stenosis, dissection or occlusion. Left carotid system: Left common and internal carotid arteries remain widely patent without stenosis, dissection or occlusion. A centric calcified plaque noted at the distal cervical left ICA just prior to the skull base without significant stenosis, stable. Vertebral arteries: Both vertebral arteries arise from the subclavian arteries. No proximal subclavian artery stenosis. Soft plaque at the origin of the left vertebral artery with associated mild-to-moderate stenosis, stable. Skeleton: No acute osseous abnormality. No discrete or worrisome osseous lesions. Other neck: Previously noted right thyroid nodule appears somewhat increased in size measuring 2.7 cm on today's exam, previously 2.4 cm. Additionally, the surrounding right lobe of thyroid is somewhat edematous and inflamed in appearance, with hazy stranding within the adjacent fat. Finding raises the possibility for acute thyroiditis. The left lobe of thyroid is hypoplastic and/or absent. No other acute soft tissue abnormality within the neck. Upper chest: Visualized upper chest demonstrates no acute finding. Review of the MIP images confirms the above findings CTA HEAD FINDINGS Anterior circulation: Petrous segments widely patent bilaterally. Mild atheromatous change within the carotid siphons without  hemodynamically significant stenosis. A1 segments widely patent. Normal anterior communicating artery complex. Anterior cerebral arteries patent to their distal aspects without stenosis. No M1 stenosis or occlusion. Normal MCA bifurcations. Distal MCA branches remain well perfused and symmetric. Posterior circulation: Both vertebral arteries patent to the vertebrobasilar junction without stenosis. Patent right PICA. Left PICA not seen. Basilar widely patent to its distal aspect without stenosis. Superior cerebellar and posterior cerebral arteries well perfused bilaterally. Venous sinuses: Patent allowing for timing the contrast bolus. Anatomic variants: None significant.  No intracranial aneurysm. Review of the MIP images confirms the above findings IMPRESSION: CT HEAD IMPRESSION: 1. No acute intracranial abnormality. 2. Generalized age-related cerebral atrophy with moderate chronic microvascular ischemic disease, with a few scattered remote lacunar infarcts about the right basal ganglia and right thalamus. 3. Left greater than right sphenoid sinusitis. CTA HEAD AND NECK IMPRESSION: 1. Stable CTA of the head and neck.  No large vessel occlusion. 2. Mild to moderate atheromatous narrowing at the origin of the left vertebral artery, stable. Additional mild for age scattered atheromatous change about the major arterial vasculature of the head and neck. No other hemodynamically significant or correctable stenosis. 3. Aberrant right subclavian artery. 4. Apparent interval enlargement of previously identified right thyroid nodule, now measuring 2.7 cm, with hazy inflammatory changes within the surrounding right thyroid lobe and adjacent soft tissues. Findings raise the possibility for acute thyroiditis. Correlation with laboratory values and symptomatology recommended. Additionally, given the apparent interval change, follow-up examination and reassessment with dedicated thyroid ultrasound recommended for complete  characterization. Electronically Signed   By: Rise Mu M.D.   On: 03/22/2020 23:59   CT Angio Neck W and/or Wo Contrast  Result Date: 03/22/2020 CLINICAL DATA:  Initial evaluation for right-sided numbness and pain. EXAM: CT ANGIOGRAPHY HEAD AND NECK TECHNIQUE: Multidetector CT imaging of the head and neck was performed using the standard protocol during bolus administration of intravenous contrast. Multiplanar CT image reconstructions and MIPs were obtained to evaluate the vascular anatomy. Carotid stenosis measurements (when applicable) are obtained utilizing NASCET criteria, using the distal internal carotid diameter as the denominator. CONTRAST:  OMNIPAQUE  IOHEXOL 350 MG/ML SOLN COMPARISON:  Prior CTA from 11/30/2019. FINDINGS: CT HEAD FINDINGS Brain: Generalized age-related cerebral atrophy with moderate chronic microvascular ischemic disease. Remote lacunar infarcts involving the right basal ganglia and thalamus noted. No acute intracranial hemorrhage. No acute large vessel territory infarct. No mass lesion, midline shift or mass effect. No hydrocephalus or extra-axial fluid collection. Vascular: No hyperdense vessel. Scattered vascular calcifications noted within the carotid siphons. Skull: Scalp soft tissues and calvarium within normal limits. Sinuses: Moderate layering opacity noted within the left greater than right sphenoid sinuses. Mild mucosal thickening noted within the right maxillary sinus. No mastoid effusion. Orbits: Globes and orbital soft tissues demonstrate no acute finding. CTA NECK FINDINGS Aortic arch: Visualized aortic arch of normal caliber. Aberrant right subclavian artery again noted. Mild atheromatous change within the arch itself. No hemodynamically significant stenosis seen about the origin of the great vessels. Right carotid system: Right common and internal carotid arteries remain widely patent without stenosis, dissection or occlusion. Left carotid system: Left  common and internal carotid arteries remain widely patent without stenosis, dissection or occlusion. A centric calcified plaque noted at the distal cervical left ICA just prior to the skull base without significant stenosis, stable. Vertebral arteries: Both vertebral arteries arise from the subclavian arteries. No proximal subclavian artery stenosis. Soft plaque at the origin of the left vertebral artery with associated mild-to-moderate stenosis, stable. Skeleton: No acute osseous abnormality. No discrete or worrisome osseous lesions. Other neck: Previously noted right thyroid nodule appears somewhat increased in size measuring 2.7 cm on today's exam, previously 2.4 cm. Additionally, the surrounding right lobe of thyroid is somewhat edematous and inflamed in appearance, with hazy stranding within the adjacent fat. Finding raises the possibility for acute thyroiditis. The left lobe of thyroid is hypoplastic and/or absent. No other acute soft tissue abnormality within the neck. Upper chest: Visualized upper chest demonstrates no acute finding. Review of the MIP images confirms the above findings CTA HEAD FINDINGS Anterior circulation: Petrous segments widely patent bilaterally. Mild atheromatous change within the carotid siphons without hemodynamically significant stenosis. A1 segments widely patent. Normal anterior communicating artery complex. Anterior cerebral arteries patent to their distal aspects without stenosis. No M1 stenosis or occlusion. Normal MCA bifurcations. Distal MCA branches remain well perfused and symmetric. Posterior circulation: Both vertebral arteries patent to the vertebrobasilar junction without stenosis. Patent right PICA. Left PICA not seen. Basilar widely patent to its distal aspect without stenosis. Superior cerebellar and posterior cerebral arteries well perfused bilaterally. Venous sinuses: Patent allowing for timing the contrast bolus. Anatomic variants: None significant.  No  intracranial aneurysm. Review of the MIP images confirms the above findings IMPRESSION: CT HEAD IMPRESSION: 1. No acute intracranial abnormality. 2. Generalized age-related cerebral atrophy with moderate chronic microvascular ischemic disease, with a few scattered remote lacunar infarcts about the right basal ganglia and right thalamus. 3. Left greater than right sphenoid sinusitis. CTA HEAD AND NECK IMPRESSION: 1. Stable CTA of the head and neck.  No large vessel occlusion. 2. Mild to moderate atheromatous narrowing at the origin of the left vertebral artery, stable. Additional mild for age scattered atheromatous change about the major arterial vasculature of the head and neck. No other hemodynamically significant or correctable stenosis. 3. Aberrant right subclavian artery. 4. Apparent interval enlargement of previously identified right thyroid nodule, now measuring 2.7 cm, with hazy inflammatory changes within the surrounding right thyroid lobe and adjacent soft tissues. Findings raise the possibility for acute thyroiditis. Correlation with laboratory values and symptomatology recommended.  Additionally, given the apparent interval change, follow-up examination and reassessment with dedicated thyroid ultrasound recommended for complete characterization. Electronically Signed   By: Rise Mu M.D.   On: 03/22/2020 23:59    Procedures Procedures (including critical care time)  Medications Ordered in ED Medications  LORazepam (ATIVAN) injection 1 mg (1 mg Intravenous Given 03/22/20 2157)  iohexol (OMNIPAQUE) 350 MG/ML injection 100 mL (100 mLs Intravenous Contrast Given 03/22/20 2229)    ED Course  I have reviewed the triage vital signs and the nursing notes.  Pertinent labs & imaging results that were available during my care of the patient were reviewed by me and considered in my medical decision making (see chart for details).    MDM Rules/Calculators/A&P                          Leslie Ward is a 86 y.o. female with a past medical history significant for hypertension, hyperlipidemia, hypothyroidism, diabetes, and prior vertigo and cervical dystonia who presents with right facial numbness, pain, and right neck pain.  She reports that she has never had this before.  She reports feels very different than the cervical dystonia symptoms she is had in the past.  They report that last night, while at rest, patient had sudden onset of symptoms in her right face and right neck.  She reports no double vision or blurry vision.  She reports her right side of her tongue feels numb but does not taste any different.  She reports she was having some difficulties speaking due to this.  She reports numbness in the right face and right neck as well as severe pain in her right neck and right face.  She denies rashes or history of shingles in her face.  She reports it feels very different than her previous cervical dystonia in the past.  She denies any trauma.  She reports no neck stiffness or posterior neck pain.  She denies any numbness, tingling, weakness of extremities.  No chest pain or abdominal pain.  No shortness of breath, cough, fevers, or chills.  No other complaints.  On exam, lungs are clear and chest is nontender.  Abdomen is nontender.  Back is nontender.  Patient does have tenderness in her right lateral neck, right posterior neck, right anterior neck.  No bruit was appreciated.  No stridor.  Patient did have a decrease in sensation in her right neck, right face, and even right tongue.  Normal extraocular movements.  Pupils are symmetric and reactive.  No droop seen.  Normal finger-nose-finger testing bilaterally.  Patient has a head tremor that family reports is normal for her.  No history of Parkinson's.  Due to this abnormal history and exam with the right facial numbness and right tongue numbness sparing the taste as well as the pain in the right neck and right head, I spoke with neurology  who recommended CTA head and neck as well as MRI if she could get it.  Son reports that she has an MRI incompatible bladder stimulator which is scheduled to be replaced next week with an MRI compatible 1 so she could not get MRI tonight regardless.  We will get a CTA head and neck as well some screening labs.  We will give her some Ativan at the recommendation of neurology to see if this helps with the pain as it could be an atypical version of her dystonia.  If this fails, he would recommend some  Robaxin.  Clinically, I doubt infection or abscess.  Doubt temporal arteritis.  No rash to suggest shingles.  Patient otherwise well-appearing.  If CT scan is abnormal, anticipate touching base with neurology.  If stroke is still considered after imaging, patient may require admission despite not getting MRI tonight.  Son reports that after she gets the MRI compatible stimulator, she might be able to get MRI outpatient.       12:08 AM  On reassessment after the Ativan, her right neck pain has improved.  She still reports some numbness on her face.  Her labs show leukocytosis 11.4 and mild hypokalemia.  CT scan appears stable from prior from an intracranial and vascular standpoint.  A possible thyroiditis was seen with a more enlarged thyroid nodule on the right.  I discussed this with the patient and we agreed to get a TSH and free T3/T4 ordered for PCP to review.  Overall she does not appear to be in thyroid storm as she is resting comfortably in no distress, she has no fever, and is not tachycardic.  Family agreed with sending this off her PCP to review and letting her follow-up with them to discuss further outpatient MRI when she is able to do so.  As her pain is improved I do suspect is more related to atypical evolution of her dystonia and spasms in her neck.  She will be given Robaxin at the recommendation of neurology and will follow up.  She understood return precautions and had no other burns or concerns.   Patient and family discharged in good condition.    Final Clinical Impression(s) / ED Diagnoses Final diagnoses:  Thyroid nodule  Neck pain  Numbness    Rx / DC Orders ED Discharge Orders         Ordered    methocarbamol (ROBAXIN) 500 MG tablet  2 times daily PRN     Discontinue  Reprint     03/23/20 0027         Clinical Impression: 1. Thyroid nodule   2. Neck pain   3. Numbness     Disposition: Discharge  Condition: Good  I have discussed the results, Dx and Tx plan with the pt(& family if present). He/she/they expressed understanding and agree(s) with the plan. Discharge instructions discussed at great length. Strict return precautions discussed and pt &/or family have verbalized understanding of the instructions. No further questions at time of discharge.    New Prescriptions   METHOCARBAMOL (ROBAXIN) 500 MG TABLET    Take 1 tablet (500 mg total) by mouth 2 (two) times daily as needed for muscle spasms.    Follow Up: [place] [address] [place] [address] [phone]     [facility] [address] [id number] [address] [place] [address] [phone]       Quantavis Obryant, Canary Brim, MD 03/23/20 [phone]

## 2020-03-23 ENCOUNTER — Telehealth: Payer: Self-pay | Admitting: Medical

## 2020-03-23 ENCOUNTER — Encounter: Payer: Self-pay | Admitting: Medical

## 2020-03-23 DIAGNOSIS — R202 Paresthesia of skin: Secondary | ICD-10-CM | POA: Diagnosis not present

## 2020-03-23 LAB — TSH: TSH: 0.81 u[IU]/mL (ref 0.350–4.500)

## 2020-03-23 LAB — T4, FREE: Free T4: 1.12 ng/dL (ref 0.61–1.12)

## 2020-03-23 MED ORDER — METHOCARBAMOL 500 MG PO TABS
500.0000 mg | ORAL_TABLET | Freq: Two times a day (BID) | ORAL | 0 refills | Status: DC | PRN
Start: 2020-03-23 — End: 2020-04-19

## 2020-03-23 NOTE — Discharge Instructions (Signed)
Your work-up today did not show evidence of acute stroke or new vascular abnormality in your head or neck.  It did show enlargement of the known thyroid nodule as well as some haziness which may indicate a thyroiditis.  You are not in thyroid storm based on your vital signs and evaluation however we did send off thyroid labs for your PCP to follow-up on.  Please rest and stay hydrated.  As your neck pain and spasms have improved, I do suspect there is a component of your dystonia and neck spasms contributing to your symptoms tonight.  Please use the muscle relaxant that neurology recommended and rest and stay hydrated.  You may need further work-up including outpatient MRI once you have your procedure to allow this.  The CT did show evidence of old strokes but clinically I have a lower suspicion for acute stroke tonight.  If any symptoms change or worsen or you develop new neurologic deficits, please return to the nearest emergency room.

## 2020-03-23 NOTE — Telephone Encounter (Signed)
Caller: Alera Call back # [phone]  [name] would like to speak with you in regards to his mom. She when to the ED yesterday and not feeling any better he is trying to see what your recommendation.

## 2020-03-24 LAB — T3, FREE: T3, Free: 1.6 pg/mL — ABNORMAL LOW (ref 2.0–4.4)

## 2020-03-24 NOTE — Telephone Encounter (Signed)
Patient's son wants the provider to look at her lab results from ER visit on 8/11 . Patient's son has some concerns about the thyroid study

## 2020-03-25 ENCOUNTER — Other Ambulatory Visit: Payer: Self-pay | Admitting: Cardiology

## 2020-03-25 NOTE — Telephone Encounter (Signed)
I reviewed labs. Borderline elevated thyroid studies but also ct which suggested possible thyroiditis. With these borderline levels want them to get scheduled for appointment this week with me. 40 minutes appointment as had other symptoms complex lengthy ED visit.  May need to reduce thyroid med dose but want to repeat levels. Also may need thyroid ultrasound.

## 2020-03-28 ENCOUNTER — Other Ambulatory Visit: Payer: Self-pay | Admitting: Medical

## 2020-03-28 ENCOUNTER — Other Ambulatory Visit: Payer: Self-pay

## 2020-03-28 ENCOUNTER — Ambulatory Visit (INDEPENDENT_AMBULATORY_CARE_PROVIDER_SITE_OTHER): Payer: Medicare Other | Admitting: Medical

## 2020-03-28 VITALS — BP 143/59 | HR 75 | Resp 16 | Ht 60.0 in | Wt 118.0 lb

## 2020-03-28 DIAGNOSIS — R5383 Other fatigue: Secondary | ICD-10-CM | POA: Diagnosis not present

## 2020-03-28 DIAGNOSIS — E010 Iodine-deficiency related diffuse (endemic) goiter: Secondary | ICD-10-CM | POA: Diagnosis not present

## 2020-03-28 DIAGNOSIS — J329 Chronic sinusitis, unspecified: Secondary | ICD-10-CM

## 2020-03-28 DIAGNOSIS — K140 Glossitis: Secondary | ICD-10-CM

## 2020-03-28 MED ORDER — GABAPENTIN 100 MG PO CAPS
100.0000 mg | ORAL_CAPSULE | Freq: Every day | ORAL | 0 refills | Status: DC
Start: 2020-03-28 — End: 2020-05-22

## 2020-03-28 MED ORDER — CEFDINIR 300 MG PO CAPS: 300.0000 mg | ORAL_CAPSULE | Freq: Two times a day (BID) | ORAL | 0 refills | Status: DC

## 2020-03-28 MED ORDER — FLUTICASONE PROPIONATE 50 MCG/ACT NA SUSP
2.0000 | Freq: Every day | NASAL | 1 refills | Status: DC
Start: 2020-03-28 — End: 2020-03-29

## 2020-03-28 MED ORDER — MECLIZINE HCL 12.5 MG PO TABS: 12.5000 mg | ORAL_TABLET | Freq: Three times a day (TID) | ORAL | 0 refills | Status: DC | PRN

## 2020-03-28 NOTE — Patient Instructions (Addendum)
For possible sinus infection rx cefdnir(note CT scan report).  Rx flonase for nasal congestion. Stop sudafed.  For sensation of thick mucus/pnd can use plain mucinex. flonase, stop sudafed.   Get US thyroid scheduled, tsh and t4 lab tomorrow. Depending on lab results may refer to endocrinologist. Might need incrimental dose adjustment of thyroid med.  For appearance of glossitis  b12 and b1 to be done tomorrow.  For restless leg rx gabapentin.  For recent dizziness refilling your meclizine. Based on overall neurologic exam today and recent negative ct I don't think further imaging needed. If any severe dizziness with gross motor or sensory deficits then be seen in ED again.  Follow up date to be determined after lab review.

## 2020-03-28 NOTE — Progress Notes (Signed)
Subjective:    Patient ID: Leslie Ward, female    DOB: Mar 21, 1931, 86 y.o.   MRN: [id number]  HPI  Pt in for follow up.  Pt seen in ED last week.   hpi from ED.  Leslie Ward is a 86 y.o. female.  The history is provided by the patient, medical records and a relative. The history is limited by the condition of the patient and a language barrier. No language interpreter was used.  Neurologic Problem This is a new problem. The current episode started 12 to 24 hours ago. The problem occurs constantly. The problem has not changed since onset.Associated symptoms include headaches. Pertinent negatives include no chest pain, no abdominal pain and no shortness of breath. Nothing aggravates the symptoms. Nothing relieves the symptoms. She has tried nothing for the symptoms. The treatment provided no relief.   A & P from ED.  MDM Rules/Calculators/A&P                          Leslie Ward is a 86 y.o. female with a past medical history significant for hypertension, hyperlipidemia, hypothyroidism, diabetes, and prior vertigo and cervical dystonia who presents with right facial numbness, pain, and right neck pain.  She reports that she has never had this before.  She reports feels very different than the cervical dystonia symptoms she is had in the past.  They report that last night, while at rest, patient had sudden onset of symptoms in her right face and right neck.  She reports no double vision or blurry vision.  She reports her right side of her tongue feels numb but does not taste any different.  She reports she was having some difficulties speaking due to this.  She reports numbness in the right face and right neck as well as severe pain in her right neck and right face.  She denies rashes or history of shingles in her face.  She reports it feels very different than her previous cervical dystonia in the past.  She denies any trauma.  She reports no neck stiffness or posterior neck  pain.  She denies any numbness, tingling, weakness of extremities.  No chest pain or abdominal pain.  No shortness of breath, cough, fevers, or chills.  No other complaints.  On exam, lungs are clear and chest is nontender.  Abdomen is nontender.  Back is nontender.  Patient does have tenderness in her right lateral neck, right posterior neck, right anterior neck.  No bruit was appreciated.  No stridor.  Patient did have a decrease in sensation in her right neck, right face, and even right tongue.  Normal extraocular movements.  Pupils are symmetric and reactive.  No droop seen.  Normal finger-nose-finger testing bilaterally.  Patient has a head tremor that family reports is normal for her.  No history of Parkinson's.  Due to this abnormal history and exam with the right facial numbness and right tongue numbness sparing the taste as well as the pain in the right neck and right head, I spoke with neurology who recommended CTA head and neck as well as MRI if she could get it.  Son reports that she has an MRI incompatible bladder stimulator which is scheduled to be replaced next week with an MRI compatible 1 so she could not get MRI tonight regardless.  We will get a CTA head and neck as well some screening labs.  We will give her some Ativan at the  recommendation of neurology to see if this helps with the pain as it could be an atypical version of her dystonia.  If this fails, he would recommend some Robaxin.  Clinically, I doubt infection or abscess.  Doubt temporal arteritis.  No rash to suggest shingles.  Patient otherwise well-appearing.  If CT scan is abnormal, anticipate touching base with neurology.  If stroke is still considered after imaging, patient may require admission despite not getting MRI tonight.  Son reports that after she gets the MRI compatible stimulator, she might be able to get MRI outpatient.       12:08 AM  On reassessment after the Ativan, her right neck pain has improved.   She still reports some numbness on her face.  Her labs show leukocytosis 11.4 and mild hypokalemia.  CT scan appears stable from prior from an intracranial and vascular standpoint.  A possible thyroiditis was seen with a more enlarged thyroid nodule on the right.  I discussed this with the patient and we agreed to get a TSH and free T3/T4 ordered for PCP to review.  Overall she does not appear to be in thyroid storm as she is resting comfortably in no distress, she has no fever, and is not tachycardic.  Family agreed with sending this off her PCP to review and letting her follow-up with them to discuss further outpatient MRI when she is able to do so.  As her pain is improved I do suspect is more related to atypical evolution of her dystonia and spasms in her neck.  She will be given Robaxin at the recommendation of neurology and will follow up.  She understood return precautions and had no other burns or concerns.  Patient and family discharged in good condition.    Now pt is overall feeling better. She has mild transient dizziness. Responds to meclizine. Taking about twice a day. No gross motor or sensory function deficits associated.  Pt had ear pain, rt side neck pain, tongue felt swollen and sinus pressure. Pt used some sudafed which her brother who is MD recommended sudafed. She now feels better. Though still some left side sinus pressure and nares feels blocked. Rt side as well to much less degree. Rt ear pain resolved. Not reporting any rt side neck pain now. Still has slight sensation of fullness over thyroid area. Feels like mucus draining in back of throat. No cough. No fevers no chills or sweats.  Pt has incidental finding of thyroid slight enlarged possibly.Borderline t4 level and mild decreased tsh level but in normal range.    Also restless legs at night. This has occurred over past 3 months. Trouble sleeping due to sensation that has to move legs.    Review of Systems    Constitutional: Negative for chills, fatigue and fever.  HENT: Positive for congestion, sinus pressure and sinus pain. Negative for drooling, ear pain, mouth sores, postnasal drip, sore throat, trouble swallowing and voice change.        See hpi.  Respiratory: Negative for cough, choking, chest tightness and wheezing.   Cardiovascular: Negative for chest pain and palpitations.  Gastrointestinal: Negative for abdominal distention, abdominal pain and blood in stool.  Musculoskeletal: Negative for back pain and neck pain.       No neck pain presenty. But faint tenderness over thryoid area.  Skin: Negative for rash.  Neurological: Positive for dizziness. Negative for headaches.       None at time of exam.  Psychiatric/Behavioral: Negative for behavioral  problems, decreased concentration and dysphoric mood.       Objective:   Physical Exam  General Mental Status- Alert. General Appearance- Not in acute distress.   Skin General: Color- Normal Color. Moisture- Normal Moisture.  Neck Carotid Arteries- Normal color. Moisture- Normal Moisture. No carotid bruits. No JVD.  Chest and Lung Exam Auscultation: Breath Sounds:-Normal.  Cardiovascular Auscultation:Rythm- Regular. Murmurs & Other Heart Sounds:Auscultation of the heart reveals- No Murmurs.  Abdomen Inspection:-Inspeection Normal. Palpation/Percussion:Note:No mass. Palpation and Percussion of the abdomen reveal- Non Tender, Non Distended + BS, no rebound or guarding.    Neurologic Cranial Nerve exam:- CN III-XII intact(No nystagmus), symmetric smile. Strength:- 5/5 equal and symmetric strength both upper and lower extremities.   heent- left side maxillary sinus moderate pressure to palpation. Faint mild left side maxillary sinus pressure. Canals clear and normal tms. Tongue appears slight enflammed. Cracked appearance to tongue.  Over thyroid faint enlarged feeling rt side.     Assessment & Plan:  For possible sinus  infection rx cefdnir(note CT scan report).  Rx flonase for nasal congestion. Stop sudafed.  For sensation of thick mucus/pnd can use plain mucinex. flonase, stop sudafed.   Get US thyroid scheduled, tsh and t4 lab tomorrow. Depending on lab results may refer to endocrinologist. Might need incrimental dose adjustment of thyroid med.  For appearance of glossitis  b12 and b1 to be done tomorrow.  For restless leg rx gabapentin.  For recent dizziness refilling your meclizine. Based on overall neurologic exam today and recent negative ct I don't think further imaging needed. If any severe dizziness with gross motor or sensory deficits then be seen in ED again.  Follow up date to be determined after lab review.  [name], PA-C   Time spent with patient today was 45+  minutes which consisted of chart review, discussing diagnosie, work up,  Treatment, answering various question, and documentation.

## 2020-03-29 ENCOUNTER — Ambulatory Visit: Payer: Medicare Other | Admitting: Medical

## 2020-03-29 ENCOUNTER — Other Ambulatory Visit: Payer: Self-pay

## 2020-03-29 ENCOUNTER — Other Ambulatory Visit: Payer: Self-pay | Admitting: Medical

## 2020-03-29 ENCOUNTER — Other Ambulatory Visit (INDEPENDENT_AMBULATORY_CARE_PROVIDER_SITE_OTHER): Payer: Medicare Other

## 2020-03-29 DIAGNOSIS — K140 Glossitis: Secondary | ICD-10-CM | POA: Diagnosis not present

## 2020-03-29 DIAGNOSIS — E010 Iodine-deficiency related diffuse (endemic) goiter: Secondary | ICD-10-CM | POA: Diagnosis not present

## 2020-03-29 DIAGNOSIS — R5383 Other fatigue: Secondary | ICD-10-CM

## 2020-03-30 ENCOUNTER — Ambulatory Visit (HOSPITAL_BASED_OUTPATIENT_CLINIC_OR_DEPARTMENT_OTHER)
Admission: RE | Admit: 2020-03-30 | Discharge: 2020-03-30 | Disposition: A | Discharge status: Home / Self Care | Payer: Medicare Other | Source: Ambulatory Visit | Attending: Medical | Admitting: Medical

## 2020-03-30 DIAGNOSIS — E010 Iodine-deficiency related diffuse (endemic) goiter: Secondary | ICD-10-CM | POA: Diagnosis present

## 2020-03-30 LAB — VITAMIN B12: Vitamin B-12: 318 pg/mL (ref 211–911)

## 2020-03-30 LAB — T4, FREE: Free T4: 1.07 ng/dL (ref 0.60–1.60)

## 2020-03-30 LAB — TSH: TSH: 1.43 u[IU]/mL (ref 0.35–4.50)

## 2020-03-30 IMAGING — US US THYROID
1 series · 13 of 25 positions shown · non-contrast
Comparison: [DATE]

CLINICAL DATA: 89-year-old female with a history of thyroid
enlargement

EXAM:
THYROID ULTRASOUND
TECHNIQUE: Ultrasound examination of the thyroid gland and adjacent soft
tissues was performed.

[Series 1: us thyroid · 13 of 27 slices shown]
[im 1/27]
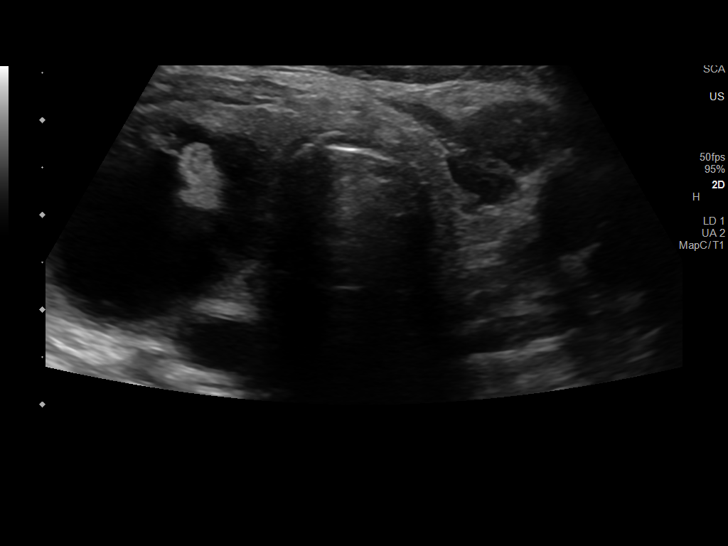
[im 3/27]
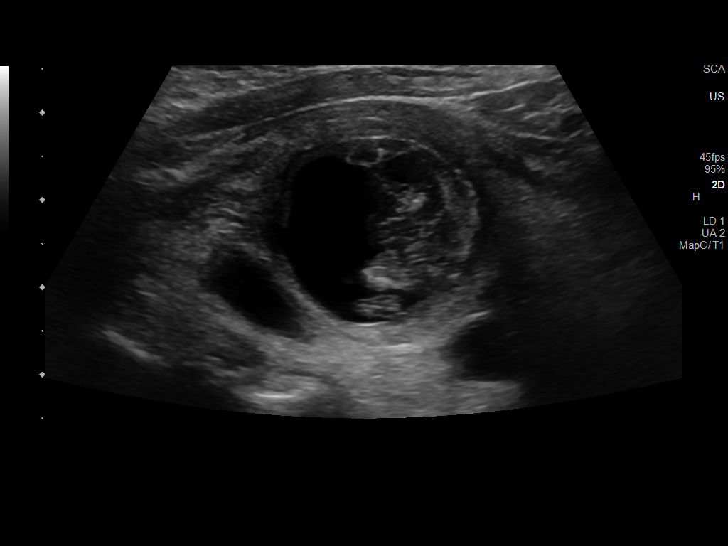
[im 5/27]
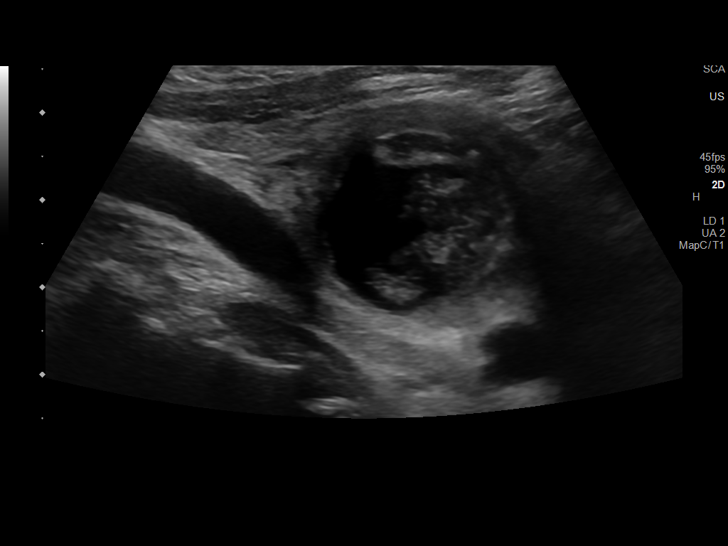
[im 7/27]
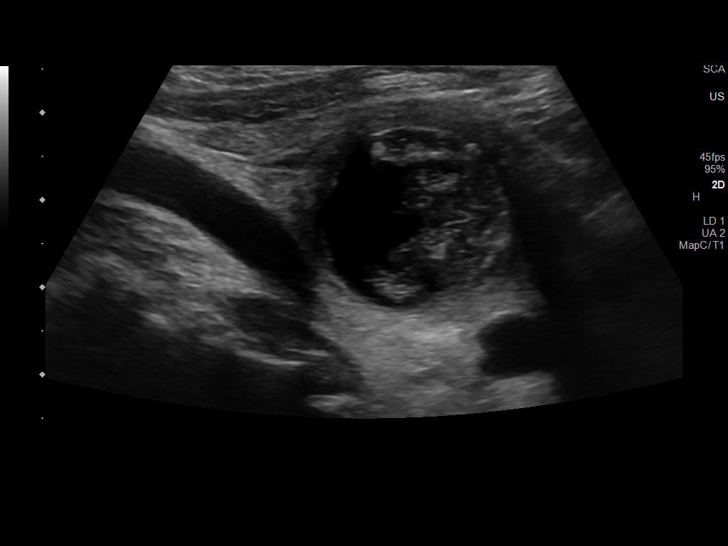
[im 9/27]
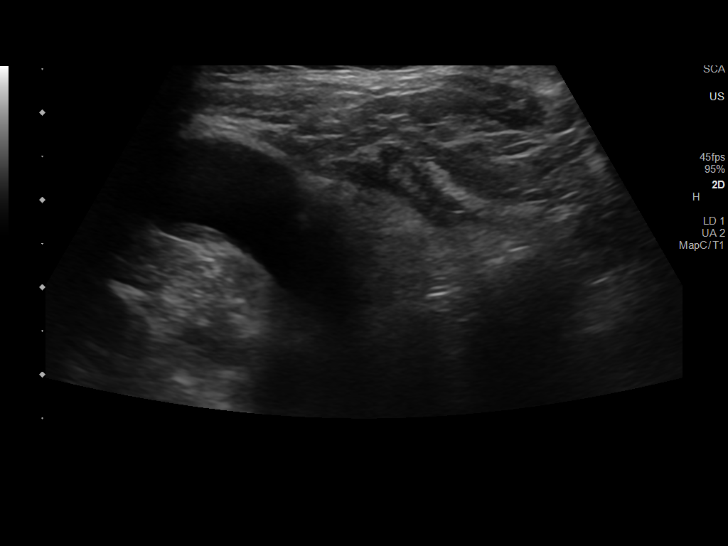
[im 11/27]
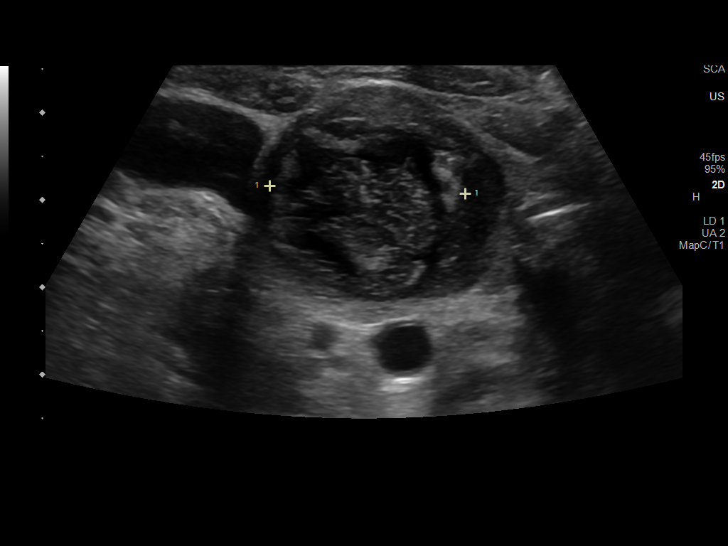
[im 14/27]
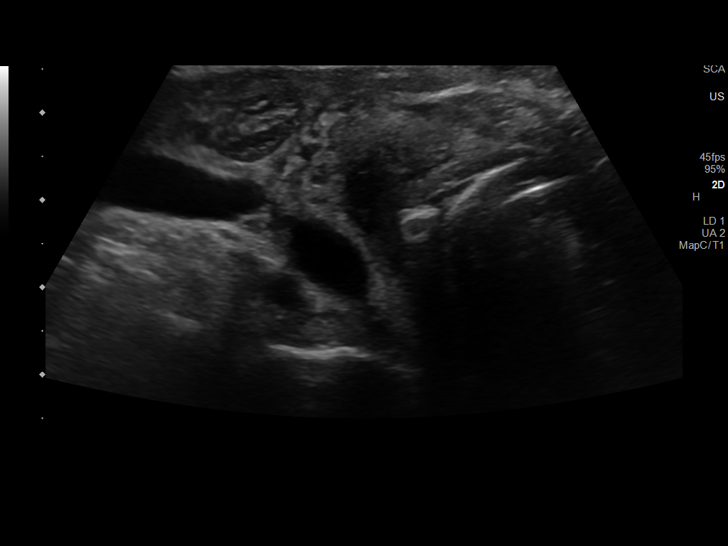
[im 16/27]
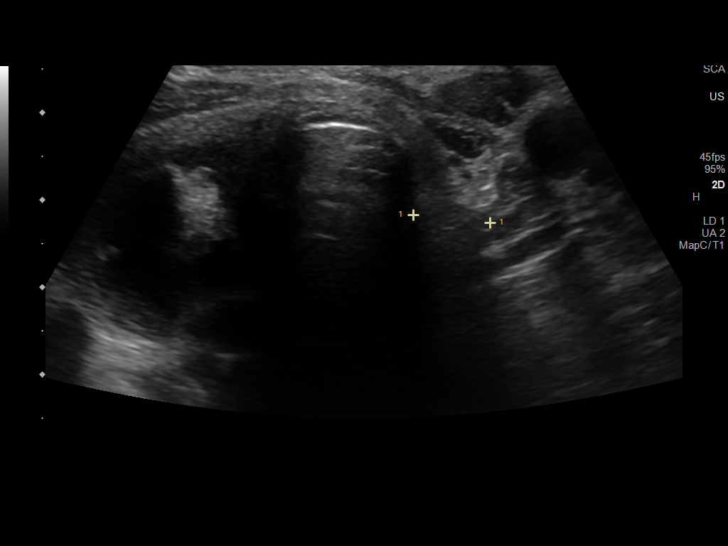
[im 18/27]
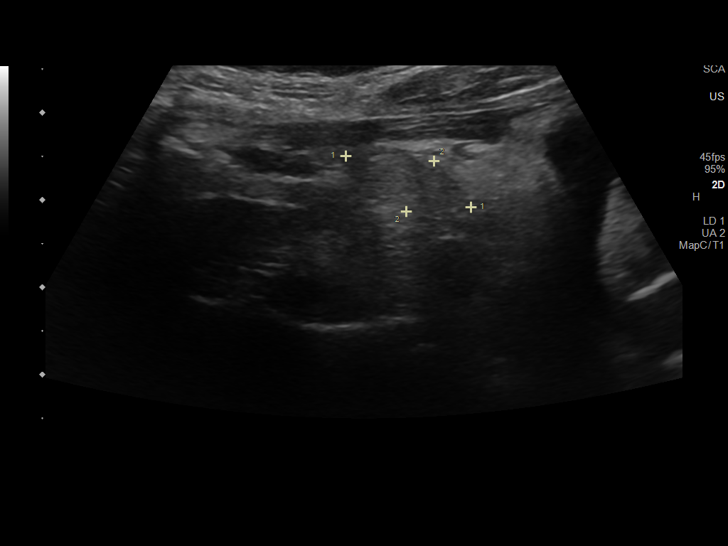
[im 20/27]
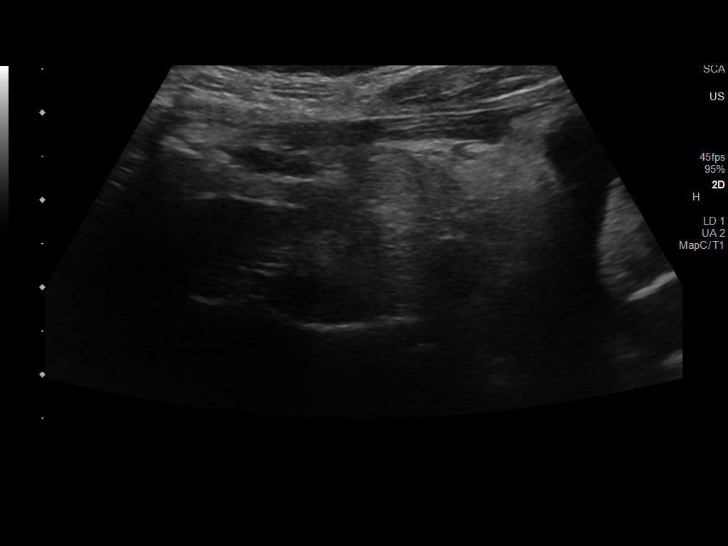
[im 22/27]
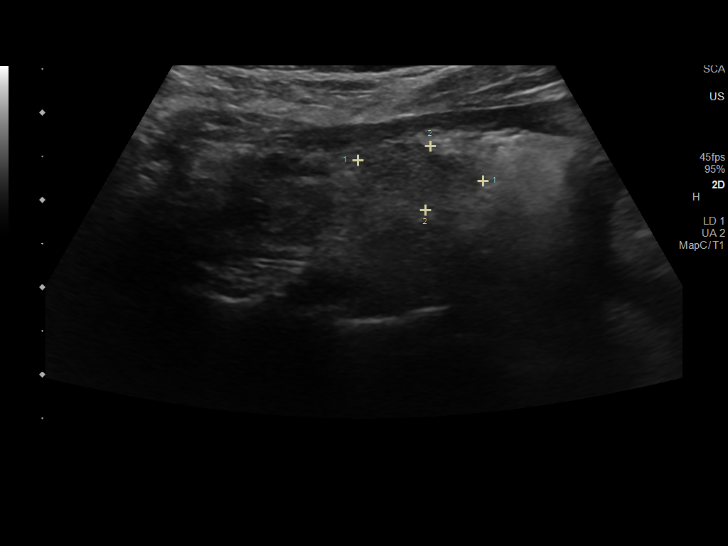
[im 24/27]
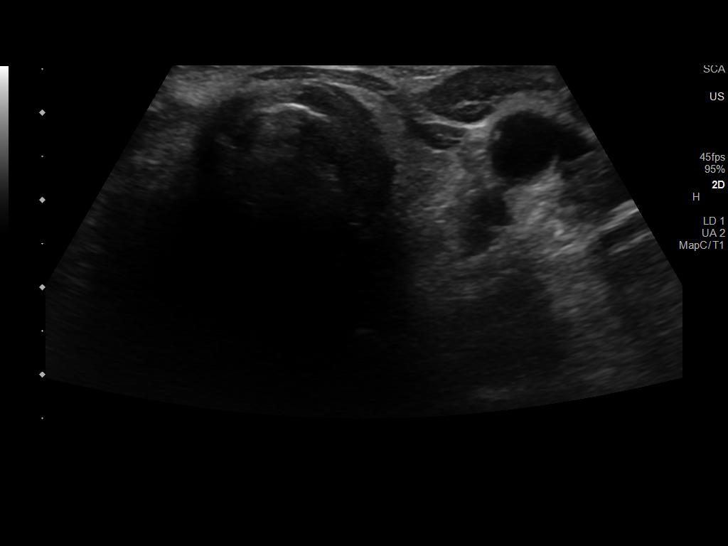
[im 27/27]
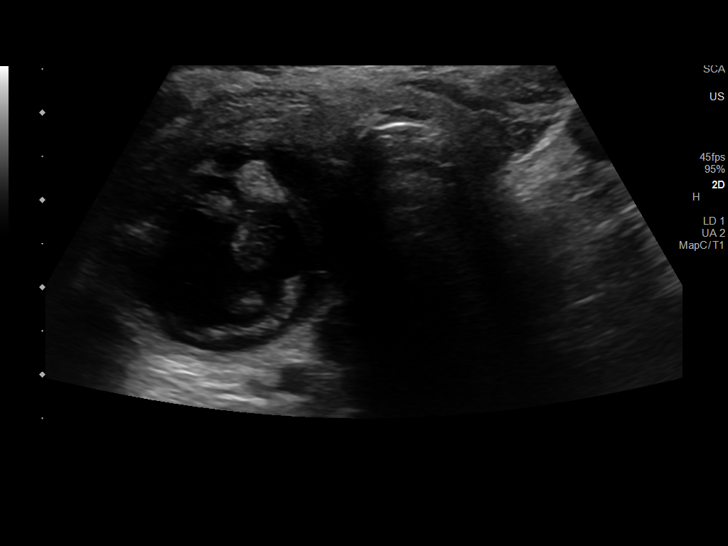

[13 of 25 positions shown; findings below may reference images not displayed]

FINDINGS: Parenchymal Echotexture: Mildly heterogenous

Isthmus: 3.5 cm

Right lobe: 3.8 cm x 2.7 cm x 2.8 cm

Left lobe: 1.4 cm x 0.7 cm x 0.9 cm

_________________________________________________________

Estimated total number of nodules >/= 1 cm: 1

Number of spongiform nodules >/=  2 cm not described below (TR1): 0

Number of mixed cystic and solid nodules >/= 1.5 cm not described
below (TR2): 0

_________________________________________________________

Nodule labeled 1 in the mid right thyroid, 2.3 cm, decreased from
the prior of 2.6 cm. This remains TR 2 and does not meet criteria
for surveillance or biopsy.

No new nodules identified.

No adenopathy.
IMPRESSION: Similar appearance of the thyroid with mild heterogeneity suggesting
medical thyroid disease.

No thyroid nodule meets criteria for biopsy or surveillance, as
designated by the newly established ACR TI-RADS criteria.

Recommendations follow those established by the new ACR TI-RADS
criteria ([HOSPITAL] [Y0];[DATE]).

## 2020-04-01 ENCOUNTER — Encounter: Payer: Self-pay | Admitting: Medical

## 2020-04-02 LAB — VITAMIN D 1,25 DIHYDROXY
Vitamin D 1, 25 (OH)2 Total: 32 pg/mL (ref 18–72)
Vitamin D2 1, 25 (OH)2: <8 pg/mL
Vitamin D3 1, 25 (OH)2: 32 pg/mL

## 2020-04-02 LAB — VITAMIN B1: Vitamin B1 (Thiamine): 16 nmol/L (ref 8–30)

## 2020-04-05 ENCOUNTER — Ambulatory Visit: Payer: Medicare Other | Admitting: Neurology

## 2020-04-19 ENCOUNTER — Encounter: Payer: Self-pay | Admitting: Neurology

## 2020-04-19 ENCOUNTER — Ambulatory Visit (INDEPENDENT_AMBULATORY_CARE_PROVIDER_SITE_OTHER): Payer: Medicare Other | Admitting: Neurology

## 2020-04-19 VITALS — BP 133/72 | HR 81 | Ht 60.0 in | Wt 120.0 lb

## 2020-04-19 DIAGNOSIS — G243 Spasmodic torticollis: Secondary | ICD-10-CM

## 2020-04-19 MED ORDER — INCOBOTULINUMTOXINA 100 UNITS IM SOLR
100.0000 [IU] | INTRAMUSCULAR | Status: DC
  Administered 2020-04-19: 100 [IU] via INTRAMUSCULAR
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
## 2020-04-19 NOTE — Progress Notes (Signed)
**  Xeomin 100 units x 1 vial, NDC 0259-1610-01, Lot [id number], Exp 07/2021, office supply.//mck,rn**

## 2020-05-22 ENCOUNTER — Encounter: Payer: Self-pay | Admitting: Medical

## 2020-05-22 ENCOUNTER — Other Ambulatory Visit: Payer: Self-pay | Admitting: Medical

## 2020-05-22 MED ORDER — FLUTICASONE PROPIONATE 50 MCG/ACT NA SUSP: 2.0000 | Freq: Every day | NASAL | 3 refills | Status: DC

## 2020-05-22 MED ORDER — GABAPENTIN 100 MG PO CAPS: 100.0000 mg | ORAL_CAPSULE | Freq: Every day | ORAL | 0 refills | Status: DC

## 2020-05-22 NOTE — Telephone Encounter (Signed)
Please send in Meclizine

## 2020-05-31 ENCOUNTER — Telehealth: Payer: Self-pay | Admitting: Medical

## 2020-05-31 MED ORDER — MECLIZINE HCL 12.5 MG PO TABS: 12.5000 mg | ORAL_TABLET | Freq: Three times a day (TID) | ORAL | 0 refills | Status: DC | PRN

## 2020-05-31 NOTE — Telephone Encounter (Signed)
RX meclizine sent to pt pharmacy per my chart request.

## 2020-05-31 NOTE — Telephone Encounter (Signed)
I don't remember getting message to refill flonase and meclizine.

## 2020-05-31 NOTE — Telephone Encounter (Signed)
Opened to rx meds. 

## 2020-06-05 ENCOUNTER — Encounter: Payer: Self-pay | Admitting: Medical

## 2020-06-13 ENCOUNTER — Other Ambulatory Visit: Payer: Self-pay

## 2020-06-13 ENCOUNTER — Ambulatory Visit (INDEPENDENT_AMBULATORY_CARE_PROVIDER_SITE_OTHER): Payer: Medicare Other

## 2020-06-13 DIAGNOSIS — Z23 Encounter for immunization: Secondary | ICD-10-CM | POA: Diagnosis not present

## 2020-07-03 ENCOUNTER — Other Ambulatory Visit: Payer: Self-pay | Admitting: Cardiology

## 2020-07-10 MED ORDER — TELMISARTAN 20 MG PO TABS: 20.0000 mg | ORAL_TABLET | Freq: Every day | ORAL | 3 refills | Status: DC

## 2020-07-19 ENCOUNTER — Encounter: Payer: Self-pay | Admitting: Neurology

## 2020-07-19 ENCOUNTER — Ambulatory Visit (INDEPENDENT_AMBULATORY_CARE_PROVIDER_SITE_OTHER): Payer: Medicare Other | Admitting: Neurology

## 2020-07-19 ENCOUNTER — Other Ambulatory Visit: Payer: Self-pay

## 2020-07-19 VITALS — BP 133/70 | HR 68 | Ht 60.0 in | Wt 119.5 lb

## 2020-07-19 DIAGNOSIS — G243 Spasmodic torticollis: Secondary | ICD-10-CM

## 2020-07-19 MED ORDER — INCOBOTULINUMTOXINA 100 UNITS IM SOLR
100.0000 [IU] | INTRAMUSCULAR | Status: DC
  Administered 2020-07-19: 100 [IU] via INTRAMUSCULAR
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
## 2020-07-19 NOTE — Progress Notes (Signed)
**  Xeomin 100 units x 1 vial, NDC 0259-1610-01, Lot [id number], Exp 11/2021, office supply.//mck,rn** 

## 2020-07-21 ENCOUNTER — Ambulatory Visit: Payer: Medicare Other | Attending: Internal Medicine

## 2020-07-21 ENCOUNTER — Other Ambulatory Visit (HOSPITAL_BASED_OUTPATIENT_CLINIC_OR_DEPARTMENT_OTHER): Payer: Self-pay | Admitting: Internal Medicine

## 2020-07-21 DIAGNOSIS — Z23 Encounter for immunization: Secondary | ICD-10-CM

## 2020-07-21 NOTE — Progress Notes (Signed)
   Covid-19 Vaccination Clinic  Name:  Leslie Ward    MRN: [id number] DOB: 1931-01-05  07/21/2020  [name] was observed post Covid-19 immunization for 15 minutes without incident. She was provided with Vaccine Information Sheet and instruction to access the V-Safe system.   [name] was instructed to call 911 with any severe reactions post vaccine: [name] Difficulty breathing  . Swelling of face and throat  . A fast heartbeat  . A bad rash all over body  . Dizziness and weakness   Immunizations Administered    No immunizations on file.

## 2020-07-25 MED FILL — MODERNA COVID-19 VACCINE 10: 100 | 1 days supply | Qty: 0 | Fill #0

## 2020-07-27 ENCOUNTER — Other Ambulatory Visit: Payer: Self-pay | Admitting: Medical

## 2020-08-04 ENCOUNTER — Other Ambulatory Visit: Payer: Self-pay | Admitting: Medical

## 2020-08-15 ENCOUNTER — Telehealth: Payer: Medicare Other | Admitting: Emergency Medicine

## 2020-08-15 DIAGNOSIS — M67439 Ganglion, unspecified wrist: Secondary | ICD-10-CM | POA: Diagnosis not present

## 2020-08-15 NOTE — Progress Notes (Signed)
E Visit for Rash  We are sorry that you are not feeling well. Here is how we plan to help!  This looks like a ganglion cyst in the picture.  They are generally not harmful and most go away on their own, but it can take quite some time.    Occasionally, if it is significantly painful or bothersome they are excised by a hand surgeon.    This is my opinion based on the information you've shared with me.    You can try using warm compresses  You can take Tylenol for pain  GET HELP RIGHT AWAY IF:   Symptoms don't go away after treatment.  Severe itching that persists.  If you rash spreads or swells.  If you rash begins to smell.  If it blisters and opens or develops a yellow-brown crust.  You develop a fever.  You have a sore throat.  You become short of breath.  MAKE SURE YOU:  Understand these instructions. Will watch your condition. Will get help right away if you are not doing well or get worse.  Thank you for choosing an e-visit. Your e-visit answers were reviewed by a board certified advanced clinical practitioner to complete your personal care plan. Depending upon the condition, your plan could have included both over the counter or prescription medications. Please review your pharmacy choice. Be sure that the pharmacy you have chosen is open so that you can pick up your prescription now.  If there is a problem you may message your provider in MyChart to have the prescription routed to another pharmacy. Your safety is important to [place]. If you have drug allergies check your prescription carefully.  For the next 24 hours, you can use MyChart to ask questions about today's visit, request a non-urgent call back, or ask for a work or school excuse from your e-visit provider. You will get an email in the next two days asking about your experience. I hope that your e-visit has been valuable and will speed your recovery.   Approximately 5 minutes was used in reviewing the  patient's chart, questionnaire, prescribing medications, and documentation.

## 2020-08-18 ENCOUNTER — Ambulatory Visit (INDEPENDENT_AMBULATORY_CARE_PROVIDER_SITE_OTHER): Payer: Medicare Other | Admitting: Medical

## 2020-08-18 ENCOUNTER — Other Ambulatory Visit: Payer: Self-pay

## 2020-08-18 ENCOUNTER — Encounter: Payer: Self-pay | Admitting: Medical

## 2020-08-18 VITALS — BP 122/60 | HR 74 | Temp 98.5°F

## 2020-08-18 DIAGNOSIS — M67439 Ganglion, unspecified wrist: Secondary | ICD-10-CM | POA: Diagnosis not present

## 2020-08-18 DIAGNOSIS — R42 Dizziness and giddiness: Secondary | ICD-10-CM | POA: Diagnosis not present

## 2020-08-18 DIAGNOSIS — G629 Polyneuropathy, unspecified: Secondary | ICD-10-CM | POA: Diagnosis not present

## 2020-08-18 MED ORDER — MECLIZINE HCL 12.5 MG PO TABS: 12.5000 mg | ORAL_TABLET | Freq: Three times a day (TID) | ORAL | 0 refills | Status: DC | PRN

## 2020-08-18 NOTE — Patient Instructions (Addendum)
Probable ganglion cyst. Placed referral to sports Med MD.   For neuropathy under control, I refilled your gabapentin.  For intermittent dizziness refilled meclizine(tyipcally your dizziness is rare, intermittent and resolves quickly with meclizine). Reviewed signs and symptoms that would indicate need for ED evaluation. No cardiac signs or neurologic signs or symptoms presently.  Follow up in 3 months or as needed.   Ganglion Cyst  A ganglion cyst is a non-cancerous, fluid-filled lump that occurs near a joint or tendon. The cyst grows out of a joint or the lining of a tendon. Ganglion cysts most often develop in the hand or wrist, but they can also develop in the shoulder, elbow, hip, knee, ankle, or foot. Ganglion cysts are ball-shaped or egg-shaped. Their size can range from the size of a pea to larger than a grape. Increased activity may cause the cyst to get bigger because more fluid starts to build up. What are the causes? The exact cause of this condition is not known, but it may be related to:  Inflammation or irritation around the joint.  An injury.  Repetitive movements or overuse.  Arthritis. What increases the risk? You are more likely to develop this condition if:  You are a woman.  You are 40-80 years old. What are the signs or symptoms? The main symptom of this condition is a lump. It most often appears on the hand or wrist. In many cases, there are no other symptoms, but a cyst can sometimes cause:  Tingling.  Pain.  Numbness.  Muscle weakness.  Weak grip.  Less range of motion in a joint. How is this diagnosed? Ganglion cysts are usually diagnosed based on a physical exam. Your health care provider will feel the lump and may shine a light next to it. If it is a ganglion cyst, the light will likely shine through it. Your health care provider may order an X-ray, ultrasound, or MRI to rule out other conditions. How is this treated? Ganglion cysts often go  away on their own without treatment. If you have pain or other symptoms, treatment may be needed. Treatment is also needed if the ganglion cyst limits your movement or if it gets infected. Treatment may include:  Wearing a brace or splint on your wrist or finger.  Taking anti-inflammatory medicine.  Having fluid drained from the lump with a needle (aspiration).  Getting a steroid injected into the joint.  Having surgery to remove the ganglion cyst.  Placing a pad on your shoe or wearing shoes that will not rub against the cyst if it is on your foot. Follow these instructions at home:  Do not press on the ganglion cyst, poke it with a needle, or hit it.  Take over-the-counter and prescription medicines only as told by your health care provider.  If you have a brace or splint: ? Wear it as told by your health care provider. ? Remove it as told by your health care provider. Ask if you need to remove it when you take a shower or a bath.  Watch your ganglion cyst for any changes.  Keep all follow-up visits as told by your health care provider. This is important. Contact a health care provider if:  Your ganglion cyst becomes larger or more painful.  You have pus coming from the lump.  You have weakness or numbness in the affected area.  You have a fever or chills. Get help right away if:  You have a fever and have any of  these in the cyst area: ? Increased redness. ? Red streaks. ? Swelling. Summary  A ganglion cyst is a non-cancerous, fluid-filled lump that occurs near a joint or tendon.  Ganglion cysts most often develop in the hand or wrist, but they can also develop in the shoulder, elbow, hip, knee, ankle, or foot.  Ganglion cysts often go away on their own without treatment. This information is not intended to replace advice given to you by your health care provider. Make sure you discuss any questions you have with your health care provider. Document Revised:  07/11/2017 Document Reviewed: 03/28/2017 Elsevier Patient Education  2020 [employer].

## 2020-08-18 NOTE — Progress Notes (Signed)
   Subjective:    Patient ID: Leslie Ward, female    DOB: [birth year], 87 y.o.   MRN: [id number]  HPI  Left wrist bump for one week. No pain occasionally. No preceding.  Pt is not happy with bump.  Hx of lower ext neuropathy. Pt is on gabapentin. Does help. She needs a refill.  Hx of transient dizziness. No ha or gross motor/sensory function deficits.  Uses meclizine and does help. She needs refill as well.    Review of Systems  Constitutional: Negative for chills, fatigue and fever.  Respiratory: Negative for chest tightness, shortness of breath and wheezing.   Cardiovascular: Negative for chest pain and palpitations.  Gastrointestinal: Negative for abdominal pain.  Genitourinary: Negative for difficulty urinating and dysuria.  Musculoskeletal: Negative for back pain.       Left wrist ganglion cyst.  Skin: Negative for rash.  Neurological:       Neuropathy.  Occasional dizziness. None recently.  Hematological: Negative for adenopathy. Does not bruise/bleed easily.  Psychiatric/Behavioral: Negative for behavioral problems and confusion. The patient is not nervous/anxious.        Objective:   Physical Exam  General- No acute distress. Pleasant patient. Lungs- Clear, even and unlabored. Heart- regular rate and rhythm. Neurologic- CNII- XII grossly intact. Left wrist- on palpation appears to have small ganglion cyst.      Assessment & Plan:  Probable ganglion cyst. Placed referral to sports Med MD.   For neuropathy under control, I refilled your gabapentin.  For intermittent dizziness refilled meclizine(tyipcally your dizziness is rare, intermittent and resolves quickly with meclizine). Reviewed signs and symptoms that would indicate need for ED evaluation. No cardiac signs or neurologic signs or symptoms presently.  Follow up in 3 months or as needed.

## 2020-08-25 ENCOUNTER — Ambulatory Visit (INDEPENDENT_AMBULATORY_CARE_PROVIDER_SITE_OTHER): Payer: Medicare Other | Admitting: Family Medicine

## 2020-08-25 ENCOUNTER — Other Ambulatory Visit: Payer: Self-pay

## 2020-08-25 ENCOUNTER — Ambulatory Visit: Payer: Self-pay

## 2020-08-25 VITALS — BP 136/70 | Ht <= 58 in | Wt 120.0 lb

## 2020-08-25 DIAGNOSIS — M7502 Adhesive capsulitis of left shoulder: Secondary | ICD-10-CM | POA: Diagnosis not present

## 2020-08-25 DIAGNOSIS — M6740 Ganglion, unspecified site: Secondary | ICD-10-CM

## 2020-08-25 HISTORY — DX: Ganglion, unspecified site: M67.40

## 2020-08-25 NOTE — Progress Notes (Signed)
  Leslie Ward - 87 y.o. female MRN [id number]  Date of birth: 18-Jul-1931  SUBJECTIVE:  Including CC & ROS.  No chief complaint on file.   Leslie Ward is a 87 y.o. female that is presenting with a cyst on the left wrist.  Denies any pain.  Has been ongoing for weeks.  Also reports having acute on chronic left shoulder issues.  She is having limitations in her range of motion.  She is trying different injections and physical therapy.  It started roughly 18 months ago.   Review of Systems See HPI   HISTORY: Past Medical, Surgical, Social, and Family History Reviewed & Updated per EMR.   Pertinent Historical Findings include:  Past Medical History:  Diagnosis Date  . Dystonia   . Hypertension   . Thyroid disease     Past Surgical History:  Procedure Laterality Date  . NO PAST SURGERIES      Family History  Problem Relation Age of Onset  . Stroke Mother   . Heart attack Father   . Heart Problems Brother     Social History   Socioeconomic History  . Marital status: Widowed    Spouse name: Not on file  . Number of children: 1  . Years of education: 6 or 7 years of education  . Highest education level: Not on file  Occupational History  . Occupation: Retired  Tobacco Use  . Smoking status: Never Smoker  . Smokeless tobacco: Never Used  Vaping Use  . Vaping Use: Never used  Substance and Sexual Activity  . Alcohol use: No  . Drug use: No  . Sexual activity: Not on file  Other Topics Concern  . Not on file  Social History Narrative   Lives alone.   Right-handed.   No caffeine use.   Social Determinants of Health   Financial Resource Strain: Not on file  Food Insecurity: Not on file  Transportation Needs: Not on file  Physical Activity: Not on file  Stress: Not on file  Social Connections: Not on file  Intimate Partner Violence: Not on file     PHYSICAL EXAM:  VS: BP 136/70   Ht 4\' 9"  (1.448 m)   Wt 120 lb (54.4 kg)   BMI 25.97 kg/m  Physical  Exam Gen: NAD, alert, cooperative with exam, well-appearing MSK:  Left wrist: Palpable cyst on the left volar wrist. No redness or swelling. Normal range of motion. Normal grip strength. Neurovascular intact  Limited ultrasound: Left wrist:  Encircling cyst of the left flexor carpi radialis at the distal radius.  No hyperemia.  Summary: Ganglion of the left wrist.  Ultrasound and interpretation by , MD    ASSESSMENT & PLAN:   Adhesive capsulitis of left shoulder Acute on chronic in nature.  Has been ongoing for roughly 18 months.  Limited improvement with injections and physical therapy. -Counseled on home exercise therapy and supportive care. -Referral to orthopedics  Ganglion cyst Occurring around the flexor carpi radialis tendon. -Counseled on supportive care. -Counseled on compression. -Could consider aspiration.

## 2020-08-25 NOTE — Patient Instructions (Addendum)
Good to see you Please try compression on the wrist   Please send me a message in MyChart with any questions or updates.  Please see [place] back as needed.     [facility] [address] [place] [address] Dr Ramond Marrow Thursday 08/31/20 at 215p Arrival time is 2p    --Dr. Jordan Likes

## 2020-08-25 NOTE — Assessment & Plan Note (Signed)
Acute on chronic in nature.  Has been ongoing for roughly 18 months.  Limited improvement with injections and physical therapy. -Counseled on home exercise therapy and supportive care. -Referral to orthopedics

## 2020-08-25 NOTE — Assessment & Plan Note (Signed)
Occurring around the flexor carpi radialis tendon. -Counseled on supportive care. -Counseled on compression. -Could consider aspiration.

## 2020-09-01 ENCOUNTER — Other Ambulatory Visit (HOSPITAL_COMMUNITY): Payer: Self-pay | Admitting: Orthopaedic Surgery

## 2020-09-01 DIAGNOSIS — M25512 Pain in left shoulder: Secondary | ICD-10-CM

## 2020-09-02 ENCOUNTER — Encounter: Payer: Self-pay | Admitting: Medical

## 2020-09-05 ENCOUNTER — Telehealth (HOSPITAL_COMMUNITY): Payer: Self-pay

## 2020-09-08 ENCOUNTER — Telehealth (INDEPENDENT_AMBULATORY_CARE_PROVIDER_SITE_OTHER): Payer: Medicare Other | Admitting: Medical

## 2020-09-08 ENCOUNTER — Other Ambulatory Visit: Payer: Self-pay

## 2020-09-08 DIAGNOSIS — F32A Depression, unspecified: Secondary | ICD-10-CM | POA: Diagnosis not present

## 2020-09-08 NOTE — Patient Instructions (Signed)
On discussion today patient and son describe patient has been having depression for the last 2 weeks made worse by social isolation during the pandemic.  Not on any depression medications and that he recently got new job/having needs.  Her mood has improved greatly.  I do think this is a good idea.  Explained to her that if her mood worsens despite getting her PET then let me know and we could consider low-dose antidepressant.  Patient and son expressed understanding.  I wrote a letter regarding emotional support pet/animal and sent to patient's MyChart.  Follow-up as regularly scheduled.

## 2020-09-08 NOTE — Progress Notes (Addendum)
   Subjective:    Patient ID: Leslie Ward, female    DOB: May 18, 1931, 87 y.o.   MRN: [id number]  HPI  Virtual Visit via Video Note  I connected with Leslie Ward on 09/08/20 at  3:00 PM EST by a video enabled telemedicine application and verified that I am speaking with the correct person using two identifiers.  Location: Patient: home Provider: office  Participants- pt, myself and son.   I discussed the limitations of evaluation and management by telemedicine and the availability of in person appointments. The patient expressed understanding and agreed to proceed.   History of Present Illness:  Pt in for evaluation. Pt has small dog/H[place]ese. They have had dog since January 1st 2022. Since having dog her mood has improved a lot. She was having some depression recently. Son notice this since pandemic. Over past 2 years minimal social interaction. Son only comes over to help. Otherwise she is alone.  Son noted she looked very depressed and lonely.  Son states since having dog mothers mood much improved.  Dog is serving as emotional support dog. They could get dog fee waived with letter.     Observations/Objective: General-no acute distress, pleasant, oriented. Lungs- on inspection lungs appear unlabored. Neck- no tracheal deviation or jvd on inspection. Neuro- gross motor function appears intact.  Assessment and Plan: On discussion today patient and son describe patient has been having depression for the last 2 weeks made worse by social isolation during the pandemic.  Not on any depression medications and that he recently got new job/having needs.  Her mood has improved greatly.  I do think this is a good idea.  Explained to her that if her mood worsens despite getting her PET then let me know and we could consider low-dose antidepressant.  Patient and son expressed understanding.  I wrote a letter regarding emotional support pet/animal and sent to patient's  MyChart.  Follow-up as regularly scheduled.  [name], PA-C  Follow Up Instructions:    I discussed the assessment and treatment plan with the patient. The patient was provided an opportunity to ask questions and all were answered. The patient agreed with the plan and demonstrated an understanding of the instructions.   The patient was advised to call back or seek an in-person evaluation if the symptoms worsen or if the condition fails to improve as anticipated.  Time spent with patient today was 15  minutes which consisted of chart revdiew, discussing diagnosis, work up treatment and documentation.   [name], PA-C   Review of Systems  Psychiatric/Behavioral: Positive for dysphoric mood. Negative for behavioral problems, confusion, decreased concentration and suicidal ideas. The patient is not nervous/anxious.        Much improved since getting pet dog.       Objective:   Physical Exam        Assessment & Plan:

## 2020-09-09 ENCOUNTER — Ambulatory Visit (HOSPITAL_BASED_OUTPATIENT_CLINIC_OR_DEPARTMENT_OTHER)
Admission: RE | Admit: 2020-09-09 | Discharge: 2020-09-09 | Disposition: A | Discharge status: Home / Self Care | Payer: Medicare Other | Source: Ambulatory Visit | Attending: Orthopaedic Surgery | Admitting: Orthopaedic Surgery

## 2020-09-09 ENCOUNTER — Other Ambulatory Visit: Payer: Self-pay

## 2020-09-09 DIAGNOSIS — M25512 Pain in left shoulder: Secondary | ICD-10-CM

## 2020-09-09 IMAGING — MR MR SHOULDER*L* W/O CM
8 series · 40 of 40 positions shown · non-contrast
Comparison: Radiograph [DATE]

CLINICAL DATA: Chronic left shoulder pain, fall of 4 years ago.
Limited range of motion with crepitus and weakness.

EXAM:
MRI OF THE LEFT SHOULDER WITHOUT CONTRAST
TECHNIQUE: Multiplanar, multisequence MR imaging of the shoulder was performed.
No intravenous contrast was administered.

[Series 3: T2 fat-sat · axial · 4.0mm · 0.55mm/px · z∈[-32,+50]mm · 5 of 18 slices shown (1 of 3)]
[im 1/18]
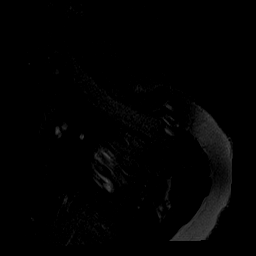
[im 5/18]
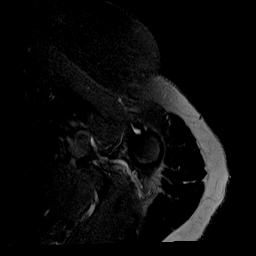
[im 9/18]
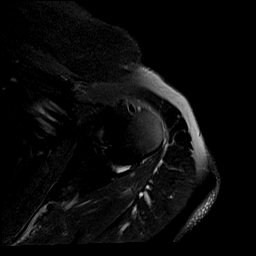
[im 13/18]
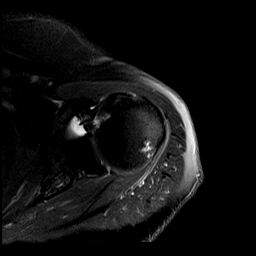
[im 18/18]
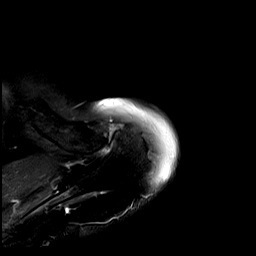

[Series 4: T2 fat-sat · oblique · 4.0mm · 0.55mm/px · 5 of 18 slices shown (2 of 3)]
[im 1/18]
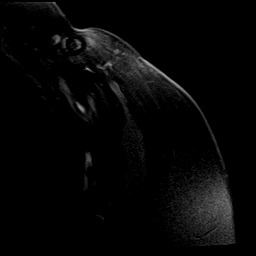
[im 5/18]
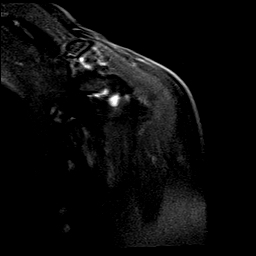
[im 9/18]
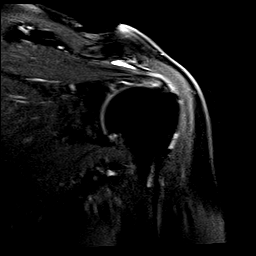
[im 13/18]
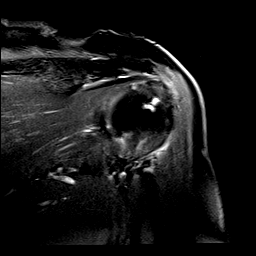
[im 18/18]
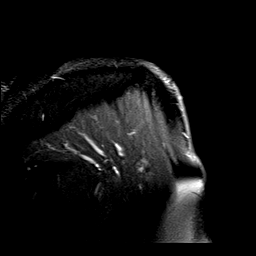

[Series 5: PD fat-sat · oblique · 4.0mm · 0.55mm/px · 6 of 18 slices shown]
[im 1/18]
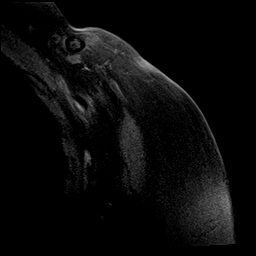
[im 4/18]
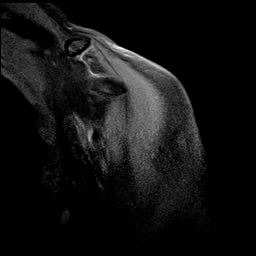
[im 7/18]
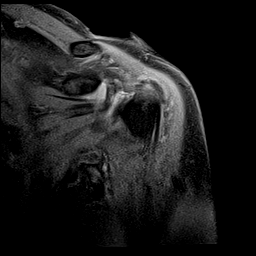
[im 11/18]
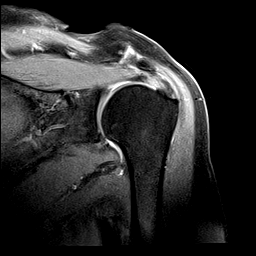
[im 14/18]
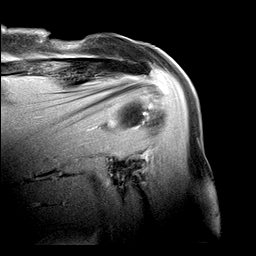
[im 18/18]
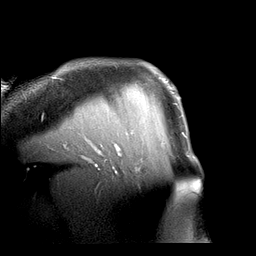

[Series 6: T2 fat-sat · oblique · 4.0mm · 0.55mm/px · 5 of 16 slices shown (3 of 3)]
[im 1/16]
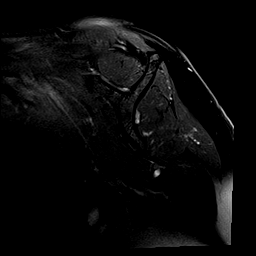
[im 4/16]
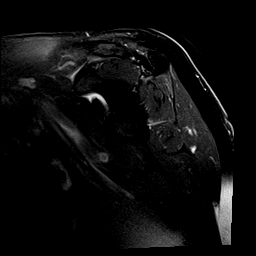
[im 8/16]
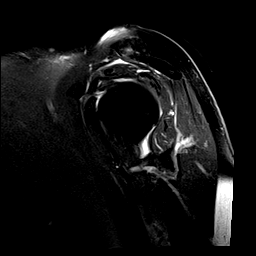
[im 12/16]
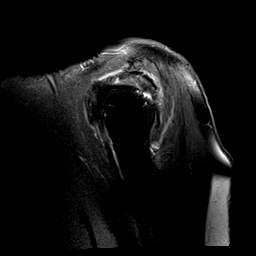
[im 16/16]
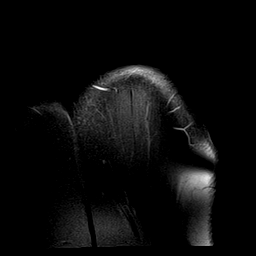

[Series 7: T1 · oblique · 4.0mm · 0.55mm/px · 5 of 16 slices shown]
[im 1/16]
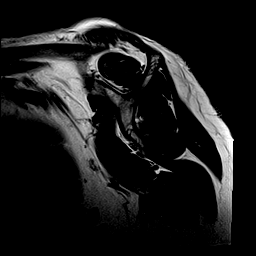
[im 4/16]
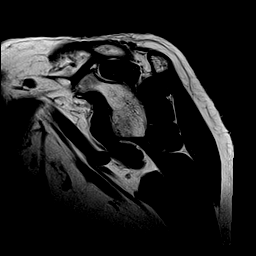
[im 8/16]
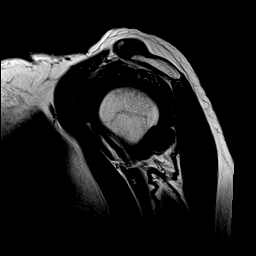
[im 12/16]
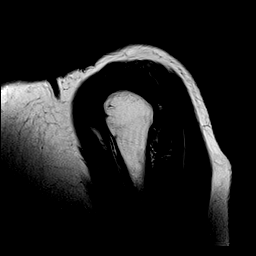
[im 16/16]
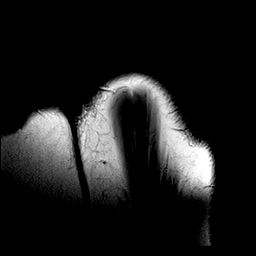

[Series 8: PD · oblique · 4.0mm · 0.55mm/px · 6 of 18 slices shown]
[im 1/18]
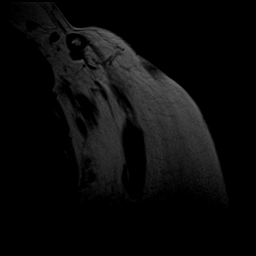
[im 4/18]
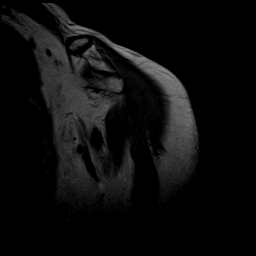
[im 7/18]
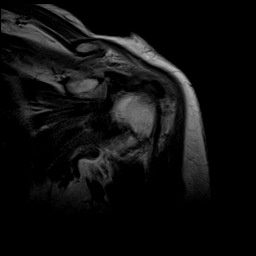
[im 11/18]
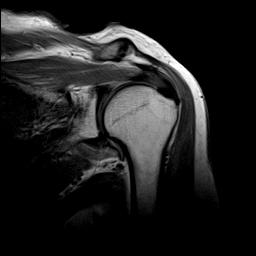
[im 14/18]
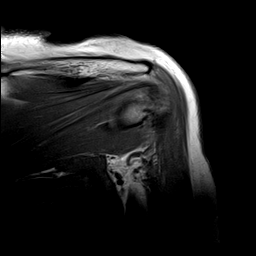
[im 18/18]
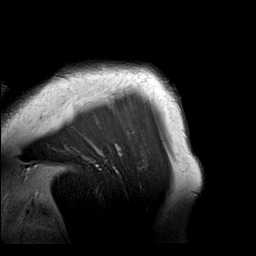

[Series 9: STIR · axial · 4.0mm · 0.27mm/px · z∈[-32,+50]mm · 6 of 18 slices shown]
[im 1/18]
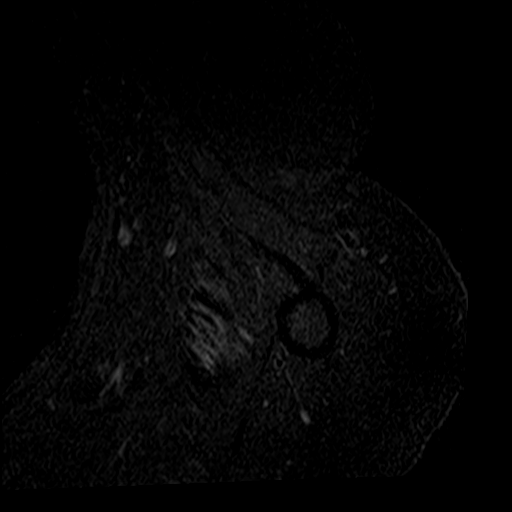
[im 4/18]
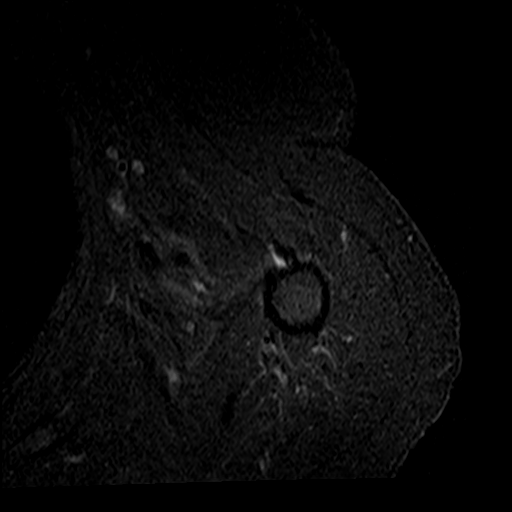
[im 7/18]
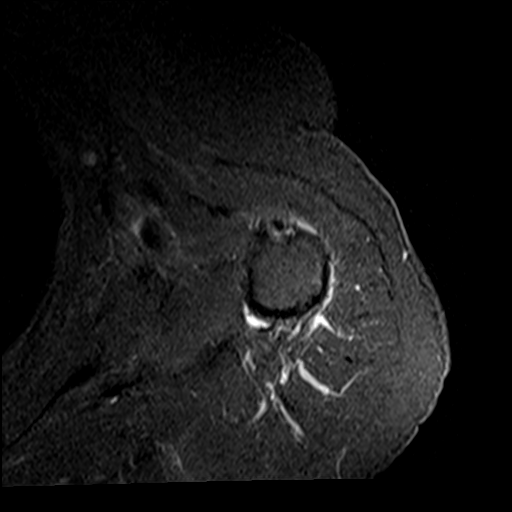
[im 11/18]
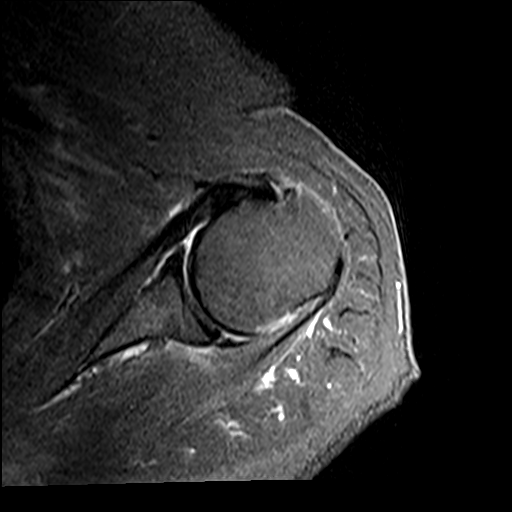
[im 14/18]
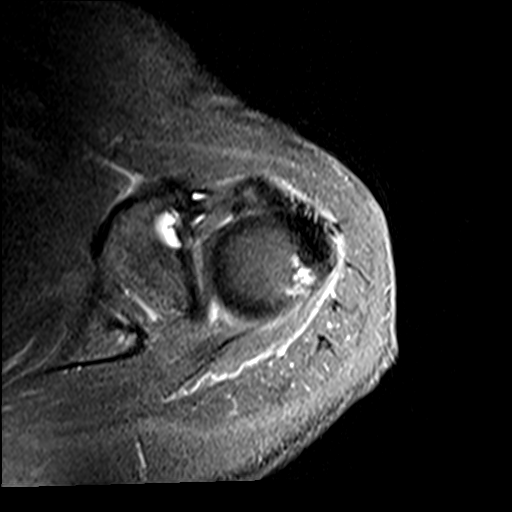
[im 18/18]
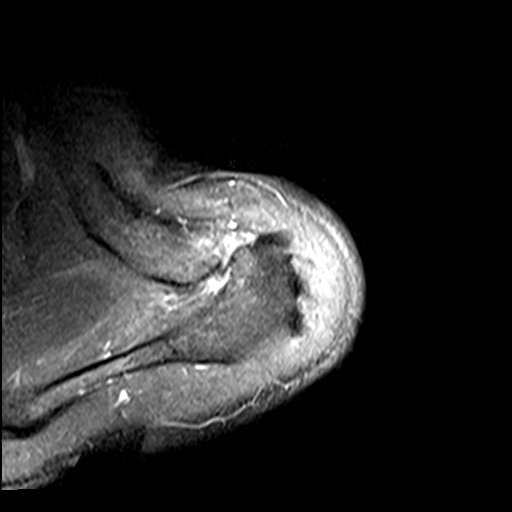

[Series 100: hx · axial · 8.0mm · 0.86mm/px · z∈[-36,+36]mm · 2 of 7 slices shown]
[im 1/7]
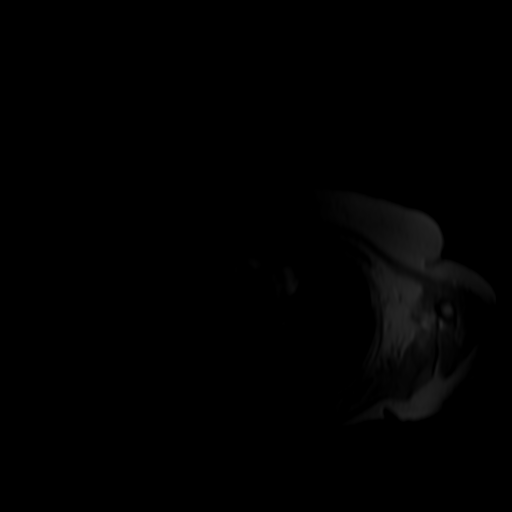
[im 7/7]
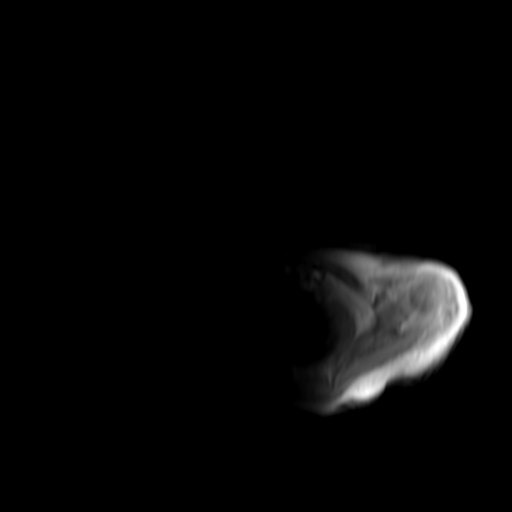

[40 of 40 positions shown; findings below may reference images not displayed]

FINDINGS: Despite efforts by the technologist and patient, motion artifact is
present on today's exam and could not be eliminated. This reduces
exam sensitivity and specificity.

Rotator cuff: Partial thickness articular surface tear of the
posterior supraspinatus tendon on images 10-12 of series 6, this
extends towards but does not definitively reach the bursal surface.

Moderate supraspinatus and infraspinatus tendinopathy with mild
distal subscapularis tendinopathy.

Muscles:  Unremarkable

Biceps long head:  Mild tendinopathy of the intra-articular segment.

Acromioclavicular Joint: Mild spurring and mild subcortical marrow
edema compatible with mild degenerative AC joint arthropathy. Type
II acromion. Mild subacromial subdeltoid bursitis.

Glenohumeral Joint: Mild degenerative chondral thinning in the
glenohumeral joint with borderline appearance for joint effusion.
Mild spurring of the humeral head. No overt synovitis.

Labrum: Linear accentuated signal traversing the posterosuperior
labrum on images 5-6 of series 3 suspicious for a small labral tear
although not well corroborated on other imaging planes.

Bones: Small degenerative subcortical cysts posteriorly along the
humeral head.

Other: No supplemental non-categorized findings.
IMPRESSION: 1. Partial thickness articular surface tear of the posterior
supraspinatus tendon. Moderate supraspinatus and infraspinatus
tendinopathy with mild distal subscapularis tendinopathy.
2. Mild subacromial subdeltoid bursitis.
3. Mild tendinopathy of the intra-articular segment of the long head
of the biceps.
4. Linear accentuated signal traversing the posterosuperior labrum
on images 5-6 of series 3 suspicious for a small labral tear
although not well corroborated on other imaging planes.
5. Mild degenerative AC joint and glenohumeral joint arthropathy.
6. Despite efforts by the technologist and patient, motion artifact
is present on today's exam and could not be eliminated. This reduces
exam sensitivity and specificity.

## 2020-09-14 DIAGNOSIS — M25512 Pain in left shoulder: Secondary | ICD-10-CM | POA: Diagnosis not present

## 2020-09-25 ENCOUNTER — Other Ambulatory Visit: Payer: Self-pay | Admitting: Medical

## 2020-09-26 ENCOUNTER — Ambulatory Visit: Payer: Medicare HMO | Attending: Orthopaedic Surgery | Admitting: Physical Therapy

## 2020-09-26 ENCOUNTER — Other Ambulatory Visit: Payer: Self-pay

## 2020-09-26 DIAGNOSIS — R293 Abnormal posture: Secondary | ICD-10-CM | POA: Diagnosis not present

## 2020-09-26 DIAGNOSIS — R42 Dizziness and giddiness: Secondary | ICD-10-CM | POA: Diagnosis not present

## 2020-09-26 DIAGNOSIS — M6281 Muscle weakness (generalized): Secondary | ICD-10-CM | POA: Diagnosis not present

## 2020-09-26 DIAGNOSIS — M25612 Stiffness of left shoulder, not elsewhere classified: Secondary | ICD-10-CM | POA: Diagnosis not present

## 2020-09-26 DIAGNOSIS — M542 Cervicalgia: Secondary | ICD-10-CM | POA: Diagnosis not present

## 2020-09-26 DIAGNOSIS — G8929 Other chronic pain: Secondary | ICD-10-CM

## 2020-09-26 DIAGNOSIS — R2681 Unsteadiness on feet: Secondary | ICD-10-CM | POA: Insufficient documentation

## 2020-09-26 DIAGNOSIS — M25512 Pain in left shoulder: Secondary | ICD-10-CM | POA: Insufficient documentation

## 2020-09-26 NOTE — Therapy (Signed)
[facility] [address]  [address] [place], [place], [address] Phone: [phone]   Fax:  [phone]  Physical Therapy Evaluation  Patient Details  Name: Leslie Ward MRN: [id number] Date of Birth: 09/16/1930 Referring Provider (PT): Ramond Marrow, MD   Encounter Date: 09/26/2020   PT End of Session - 09/26/20 0933    Visit Number 1    Number of Visits 12    Date for PT Re-Evaluation 11/07/20    Authorization Type [employer] Medicare & Medicaid    PT Start Time [phone]    PT Stop Time 1020    PT Time Calculation (min) 47 min    Activity Tolerance Patient tolerated treatment well    Behavior During Therapy [facility] for tasks assessed/performed           Past Medical History:  Diagnosis Date  . Dystonia   . Hypertension   . Thyroid disease     Past Surgical History:  Procedure Laterality Date  . NO PAST SURGERIES      There were no vitals filed for this visit.    Subjective Assessment - 09/26/20 0937    Subjective Pt's son reports she experienced trauma to her L arm a few years ago where her grandson pulled on her arm and then later fell against her arm. Imaging negatove fro RTC tear but revealed weakness of tendons.    Pertinent History vertigo, thyroid disease, HTN, DM, cervical dystonia, bladder stimulator    Diagnostic tests L shoulder MRI 09/09/20: 1. Partial thickness articular surface tear of the posterior supraspinatus tendon. Moderate supraspinatus and infraspinatus tendinopathy with mild distal subscapularis tendinopathy.  2. Mild subacromial subdeltoid bursitis.  3. Mild tendinopathy of the intra-articular segment of the long head of the biceps.  4. Linear accentuated signal traversing the posterosuperior labrum  on images 5-6 of series 3 suspicious for a small labral tear although not well corroborated on other imaging planes.  5. Mild degenerative AC joint and glenohumeral joint arthropathy.  6. Despite efforts by  the technologist and patient, motion artifact  is present on today's exam and could not be eliminated. This reduces  exam sensitivity and specificity.    Patient Stated Goals "to feel better"    Currently in Pain? Yes    Pain Score 4     Pain Location Shoulder    Pain Orientation Left;Upper;Anterior    Pain Descriptors / Indicators Tightness;Sore   "puling"   Pain Type Chronic pain    Pain Radiating Towards occasionally down to elbow    Pain Onset Other (comment)   4 yrs   Pain Frequency Intermittent    Aggravating Factors  reaching behind her back, reaching overhead    Pain Relieving Factors pain meds, ice pack    Effect of Pain on Daily Activities difficulty picking up her puppy              [facility] PT Assessment - 09/26/20 0933      Assessment   Medical Diagnosis L shoulder OA    Referring Provider (PT) Ramond Marrow, MD    Onset Date/Surgical Date --   4 yrs   Hand Dominance Right    Next MD Visit PRN in early March    Prior Therapy PT for vertigo last year; neck & L shoulder pain in 2020      Precautions   Precautions None      Restrictions   Weight Bearing Restrictions No  Balance Screen   Has the patient fallen in the past 6 months No    Has the patient had a decrease in activity level because of a fear of falling?  Yes    Is the patient reluctant to leave their home because of a fear of falling?  No      Home Environment   Living Environment Private residence    Living Arrangements Alone    Available Help at Discharge Family    Type of Home Apartment    Home Access Level entry      Prior Function   Level of Independence Independent    Vocation Retired    Leisure [employer] witness, walking      Cognition   Overall Cognitive Status Within Functional Limits for tasks assessed      Observation/Other Assessments   Focus on Therapeutic Outcomes (FOTO)  Shoulder: FS=56, predicted discharge FS=61      Posture/Postural Control   Posture/Postural Control  Postural limitations    Postural Limitations Rounded Shoulders;Forward head;Increased thoracic kyphosis      ROM / Strength   AROM / PROM / Strength AROM;PROM;Strength      AROM   AROM Assessment Site Shoulder    Right/Left Shoulder Right;Left    Right Shoulder Flexion 145 Degrees    Right Shoulder ABduction 164 Degrees    Right Shoulder Internal Rotation --   FIR to bra strap   Right Shoulder External Rotation --   FER to T1   Left Shoulder Flexion 129 Degrees    Left Shoulder ABduction 142 Degrees    Left Shoulder Internal Rotation 70 Degrees   FIR to 1" below bra strap - mild pain   Left Shoulder External Rotation 64 Degrees   FER to T1     PROM   PROM Assessment Site Shoulder    Right/Left Shoulder Left    Left Shoulder Flexion 160 Degrees    Left Shoulder ABduction 170 Degrees    Left Shoulder Internal Rotation 80 Degrees    Left Shoulder External Rotation 65 Degrees      Strength   Strength Assessment Site Shoulder    Right/Left Shoulder Right;Left    Right Shoulder Flexion 4/5    Right Shoulder ABduction 4/5    Right Shoulder Internal Rotation 4/5    Right Shoulder External Rotation 4/5    Left Shoulder Flexion 4-/5    Left Shoulder ABduction 4-/5    Left Shoulder Internal Rotation 4-/5    Left Shoulder External Rotation 3+/5                      Objective measurements completed on examination: See above findings.               PT Education - 09/26/20 1015    Education Details PT eval findings, anticipated POC & initial HEP - Access Code: [email or web address]    Person(s) Educated Patient;Child(ren)   son   Methods Explanation;Demonstration;Verbal cues;Handout    Comprehension Verbalized understanding;Verbal cues required;Returned demonstration;Need further instruction            PT Short Term Goals - 09/26/20 1020      PT SHORT TERM GOAL #1   Title Patient will be independent with initial HEP    Status New    Target Date 10/17/20              PT Long Term Goals - 09/26/20 1020      PT LONG TERM  GOAL #1   Title Patient will be independent with ongoing/advanced HEP for self-management at home    Status New    Target Date 11/07/20      PT LONG TERM GOAL #2   Title Patient to improve L shoulder AROM to essentitally equivalent to R shoulder without pain provocation    Status New    Target Date 11/07/20      PT LONG TERM GOAL #3   Title Patient will demonstrate improved L shoulder strength to >/= 4 to 4+/5 for functional UE use    Status New    Target Date 11/07/20      PT LONG TERM GOAL #4   Title Patient to report ability to perform ADLs and household tasks without limitation due to L shoulder pain, LOM or weakness    Status New    Target Date 11/07/20      PT LONG TERM GOAL #5   Title Patient will report ability to pick up her dog w/o limitation due to L shoulder    Status New    Target Date 11/07/20                  Plan - 09/26/20 1020    Clinical Impression Statement Leslie Ward is an 87 y/o female who presents to OP PT for L shoulder pain 2 L shoulder OA and adhesive capsulitis. Her son reports she initially injured her shoulder 3-4 yrs ago when she was reaching to the back seat in the car and her grandson pulled on her arm. She tried some PT around the time of injury but was not able to complete the PT episode due to concerns about COVID at the time. She has recently received an injection at the MD office with significant relief noted. Current deficits include postural abnormalities, mildly limited L shoulder AROM, decreased L shoulder strength and limited functional use of L arm especially with lifting. Leslie Ward will benefit from skilled PT to address above deficits, improve posture and restore pain-free functional ROM and strength in L shoulder to allow her to resume normal daily activities and care for her puppy without pain interference.    Personal Factors and Comorbidities Age;Comorbidity  3+;Fitness;Past/Current Experience;Time since onset of injury/illness/exacerbation    Comorbidities vertigo, thyroid disease, HTN, DM, cervical dystonia, bladder stimulator    Examination-Activity Limitations Lift;Carry;Reach Overhead;Bathing;Dressing;Hygiene/Grooming    Examination-Participation Restrictions Cleaning;Laundry;Meal Prep    Stability/Clinical Decision Making Evolving/Moderate complexity    Clinical Decision Making Moderate    Rehab Potential Good    PT Frequency 2x / week    PT Duration 6 weeks    PT Treatment/Interventions ADLs/Self Care Home Management;Cryotherapy;Iontophoresis 4mg /ml Dexamethasone;Moist Heat;Functional mobility training;Therapeutic activities;Therapeutic exercise;Neuromuscular re-education;Patient/family education;Manual techniques;Passive range of motion;Dry needling;Taping;Vasopneumatic Device    PT Next Visit Plan postural strengthening/stabilization, L shoulder ROM and strengthening    PT Home Exercise Plan MedBridge Access Code: [email or web address] ([email or web address])    Consulted and Agree with Plan of Care Patient;Family member/caregiver    Family Member Consulted son           Patient will benefit from skilled therapeutic intervention in order to improve the following deficits and impairments:  Decreased activity tolerance,Decreased knowledge of precautions,Decreased range of motion,Decreased strength,Increased fascial restricitons,Increased muscle spasms,Impaired perceived functional ability,Impaired flexibility,Impaired UE functional use,Improper body mechanics,Postural dysfunction,Pain  Visit Diagnosis: Chronic left shoulder pain - Plan: PT plan of care cert/re-cert  Stiffness of left shoulder, not elsewhere classified - Plan: PT plan of care cert/re-cert  Muscle weakness (generalized) -  Plan: PT plan of care cert/re-cert  Abnormal posture - Plan: PT plan of care cert/re-cert     Problem List Patient Active Problem List   Diagnosis Date Noted  . Ganglion cyst  08/25/2020  . Vertigo 12/01/2019  . Cervical dystonia 03/08/2019  . Hyperlipidemia 02/11/2019  . Edema 02/11/2019  . Hypertension 01/19/2019  . Type 2 diabetes mellitus (HCC) 01/19/2019  . Hypothyroidism (acquired) 01/19/2019  . Neck pain 01/19/2019  . Dystonia 01/19/2019  . Adhesive capsulitis of left shoulder 12/22/2018    Marry Guan, PT, MPT 09/26/2020, 12:48 PM  [facility] [address]  [address] [place], [place], [address] Phone: [phone]   Fax:  [phone]  Name: Leslie Ward MRN: [id number] Date of Birth: 06-Apr-1931

## 2020-09-26 NOTE — Patient Instructions (Signed)
    Access Code: [email or web address] URL: [email or web address] Date: 09/26/2020 Prepared by: Glenetta Hew  Exercises Seated Gentle Upper Trapezius Stretch - 1 x daily - 7 x weekly - 2 sets - 10 reps - 3 sec hold Seated Scapular Retraction - 1 x daily - 7 x weekly - 2 sets - 10 reps - 3 sec hold Seated Scapular Retraction with External Rotation - 1 x daily - 7 x weekly - 2 sets - 10 reps - 3 sec hold

## 2020-10-01 ENCOUNTER — Other Ambulatory Visit: Payer: Self-pay | Admitting: Medical

## 2020-10-02 ENCOUNTER — Encounter: Payer: Self-pay | Admitting: Medical

## 2020-10-02 MED ORDER — MECLIZINE HCL 12.5 MG PO TABS: 12.5000 mg | ORAL_TABLET | Freq: Three times a day (TID) | ORAL | 0 refills | Status: DC | PRN

## 2020-10-02 MED ORDER — TELMISARTAN 20 MG PO TABS: 20.0000 mg | ORAL_TABLET | Freq: Every day | ORAL | 3 refills | Status: DC

## 2020-10-02 MED ORDER — GABAPENTIN 100 MG PO CAPS: 100.0000 mg | ORAL_CAPSULE | Freq: Once | ORAL | 0 refills | Status: DC

## 2020-10-03 ENCOUNTER — Ambulatory Visit: Payer: Medicare HMO

## 2020-10-03 ENCOUNTER — Other Ambulatory Visit: Payer: Self-pay

## 2020-10-03 DIAGNOSIS — M25512 Pain in left shoulder: Secondary | ICD-10-CM | POA: Diagnosis not present

## 2020-10-03 DIAGNOSIS — M6281 Muscle weakness (generalized): Secondary | ICD-10-CM

## 2020-10-03 DIAGNOSIS — M25612 Stiffness of left shoulder, not elsewhere classified: Secondary | ICD-10-CM

## 2020-10-03 DIAGNOSIS — R2681 Unsteadiness on feet: Secondary | ICD-10-CM

## 2020-10-03 DIAGNOSIS — R293 Abnormal posture: Secondary | ICD-10-CM | POA: Diagnosis not present

## 2020-10-03 DIAGNOSIS — M542 Cervicalgia: Secondary | ICD-10-CM | POA: Diagnosis not present

## 2020-10-03 DIAGNOSIS — R42 Dizziness and giddiness: Secondary | ICD-10-CM

## 2020-10-03 DIAGNOSIS — G8929 Other chronic pain: Secondary | ICD-10-CM | POA: Diagnosis not present

## 2020-10-03 MED ORDER — MECLIZINE HCL 12.5 MG PO TABS: 12.5000 mg | ORAL_TABLET | Freq: Three times a day (TID) | ORAL | 0 refills | Status: DC | PRN

## 2020-10-03 NOTE — Therapy (Signed)
[facility] [address]  [address] [place], [place], [address] Phone: [phone]   Fax:  [phone]  Physical Therapy Treatment  Patient Details  Name: Leslie Ward MRN: [id number] Date of Birth: 05-21-1931 Referring Provider (PT): Ramond Marrow, MD   Encounter Date: 10/03/2020   PT End of Session - 10/03/20 0956    Visit Number 2    Number of Visits 12    Date for PT Re-Evaluation 11/07/20    Authorization Type Aetna Medicare & Medicaid    PT Start Time [phone]    PT Stop Time 0932    PT Time Calculation (min) 43 min    Activity Tolerance Patient tolerated treatment well    Behavior During Therapy [facility] for tasks assessed/performed           Past Medical History:  Diagnosis Date  . Dystonia   . Hypertension   . Thyroid disease     Past Surgical History:  Procedure Laterality Date  . NO PAST SURGERIES      There were no vitals filed for this visit.   Subjective Assessment - 10/03/20 1007    Subjective Pt reports she is doing good, has ben doing the exercises she was given.    Pertinent History vertigo, thyroid disease, HTN, DM, cervical dystonia, bladder stimulator    Diagnostic tests L shoulder MRI 09/09/20: 1. Partial thickness articular surface tear of the posterior supraspinatus tendon. Moderate supraspinatus and infraspinatus tendinopathy with mild distal subscapularis tendinopathy.  2. Mild subacromial subdeltoid bursitis.  3. Mild tendinopathy of the intra-articular segment of the long head of the biceps.  4. Linear accentuated signal traversing the posterosuperior labrum  on images 5-6 of series 3 suspicious for a small labral tear although not well corroborated on other imaging planes.  5. Mild degenerative AC joint and glenohumeral joint arthropathy.  6. Despite efforts by the technologist and patient, motion artifact  is present on today's exam and could not be eliminated. This reduces  exam sensitivity and  specificity.    Patient Stated Goals "to feel better"    Currently in Pain? No/denies                             [facility] Adult PT Treatment/Exercise - 10/03/20 0001      Exercises   Exercises Shoulder      Shoulder Exercises: Supine   External Rotation AAROM;Left;10 reps    External Rotation Limitations cane    Flexion AAROM;Both;10 reps    Flexion Limitations cane    ABduction AAROM;Both;10 reps    ABduction Limitations cane; cueing for technique    Other Supine Exercises serratus punches with cane 15 reps    Other Supine Exercises ER with towel underneath elbow 10 reps      Shoulder Exercises: Seated   Row Strengthening;Both;20 reps    Theraband Level (Shoulder Row) Level 1 (Yellow)    Other Seated Exercises extensions with Y tband 10 reps                    PT Short Term Goals - 10/03/20 1004      PT SHORT TERM GOAL #1   Title Patient will be independent with initial HEP    Status On-going    Target Date 10/17/20             PT Long Term Goals - 10/03/20 1004  PT LONG TERM GOAL #1   Title Patient will be independent with ongoing/advanced HEP for self-management at home    Status On-going      PT LONG TERM GOAL #2   Title Patient to improve L shoulder AROM to essentitally equivalent to R shoulder without pain provocation    Status On-going      PT LONG TERM GOAL #3   Title Patient will demonstrate improved L shoulder strength to >/= 4 to 4+/5 for functional UE use    Status On-going      PT LONG TERM GOAL #4   Title Patient to report ability to perform ADLs and household tasks without limitation due to L shoulder pain, LOM or weakness    Status On-going      PT LONG TERM GOAL #5   Title Patient will report ability to pick up her dog w/o limitation due to L shoulder    Status On-going                 Plan - 10/03/20 0956    Clinical Impression Statement Pt had a good response to treatment, reviewed HEP with pt,  she understood all exercises and reported she does them everyday. Introduced scap stability exercises and gentle AAROM with cane. Pt did report difficulty with going into abduction but cues were given to avoid going to any painful ROM. Educated pt on postural exercises and the outlook of PT for the next few weeks. Popping was noted in the shoulder towards the last few reps of completing the chest presses with the cane but pt had no other reports of pain during treatment.    Personal Factors and Comorbidities Age;Comorbidity 3+;Fitness;Past/Current Experience;Time since onset of injury/illness/exacerbation    Comorbidities vertigo, thyroid disease, HTN, DM, cervical dystonia, bladder stimulator    PT Frequency 2x / week    PT Duration 6 weeks    PT Treatment/Interventions ADLs/Self Care Home Management;Cryotherapy;Iontophoresis 4mg /ml Dexamethasone;Moist Heat;Functional mobility training;Therapeutic activities;Therapeutic exercise;Neuromuscular re-education;Patient/family education;Manual techniques;Passive range of motion;Dry needling;Taping;Vasopneumatic Device    PT Next Visit Plan postural strengthening/stabilization, L shoulder ROM and strengthening    PT Home Exercise Plan MedBridge Access Code: [email or web address] ([email or web address])    Consulted and Agree with Plan of Care Patient           Patient will benefit from skilled therapeutic intervention in order to improve the following deficits and impairments:  Decreased activity tolerance,Decreased knowledge of precautions,Decreased range of motion,Decreased strength,Increased fascial restricitons,Increased muscle spasms,Impaired perceived functional ability,Impaired flexibility,Impaired UE functional use,Improper body mechanics,Postural dysfunction,Pain  Visit Diagnosis: Chronic left shoulder pain  Stiffness of left shoulder, not elsewhere classified  Muscle weakness (generalized)  Abnormal posture  Dizziness and giddiness  Unsteadiness on  feet  Cervicalgia     Problem List Patient Active Problem List   Diagnosis Date Noted  . Ganglion cyst 08/25/2020  . Vertigo 12/01/2019  . Cervical dystonia 03/08/2019  . Hyperlipidemia 02/11/2019  . Edema 02/11/2019  . Hypertension 01/19/2019  . Type 2 diabetes mellitus (HCC) 01/19/2019  . Hypothyroidism (acquired) 01/19/2019  . Neck pain 01/19/2019  . Dystonia 01/19/2019  . Adhesive capsulitis of left shoulder 12/22/2018    02/21/2019, PTA 10/03/2020, 10:10 AM  [facility] [address]  [address] [place], [place], [place] Phone: [phone]   Fax:  [phone]  Name: Shamarie Call MRN: [name] Date of Birth: Sep 20, 1930

## 2020-10-04 MED ORDER — MECLIZINE HCL 12.5 MG PO TABS: 12.5000 mg | ORAL_TABLET | Freq: Three times a day (TID) | ORAL | 0 refills | Status: DC | PRN

## 2020-10-04 NOTE — Addendum Note (Signed)
Addended byConrad  D on: 10/04/2020 07:50 AM   Modules accepted: Orders

## 2020-10-04 NOTE — Telephone Encounter (Signed)
Called [facility]- cancelled meclizine 12.5mg  that was sent to them 10/03/20.

## 2020-10-06 DIAGNOSIS — E079 Disorder of thyroid, unspecified: Secondary | ICD-10-CM | POA: Insufficient documentation

## 2020-10-10 ENCOUNTER — Other Ambulatory Visit: Payer: Self-pay

## 2020-10-10 ENCOUNTER — Ambulatory Visit: Payer: Medicare HMO | Attending: Orthopaedic Surgery

## 2020-10-10 DIAGNOSIS — R293 Abnormal posture: Secondary | ICD-10-CM | POA: Insufficient documentation

## 2020-10-10 DIAGNOSIS — M6281 Muscle weakness (generalized): Secondary | ICD-10-CM | POA: Diagnosis not present

## 2020-10-10 DIAGNOSIS — G8929 Other chronic pain: Secondary | ICD-10-CM | POA: Diagnosis not present

## 2020-10-10 DIAGNOSIS — M25612 Stiffness of left shoulder, not elsewhere classified: Secondary | ICD-10-CM

## 2020-10-10 DIAGNOSIS — R42 Dizziness and giddiness: Secondary | ICD-10-CM

## 2020-10-10 DIAGNOSIS — M542 Cervicalgia: Secondary | ICD-10-CM | POA: Insufficient documentation

## 2020-10-10 DIAGNOSIS — R2681 Unsteadiness on feet: Secondary | ICD-10-CM

## 2020-10-10 DIAGNOSIS — M25512 Pain in left shoulder: Secondary | ICD-10-CM | POA: Insufficient documentation

## 2020-10-10 NOTE — Therapy (Signed)
[facility] [address]  [place], [place], [address] Phone: [phone]   Fax:  [phone]  Physical Therapy Treatment  Patient Details  Name: Leslie Ward MRN: [id number] Date of Birth: May 04, 1931 Referring Provider (PT): Ophelia Charter, MD   Encounter Date: 10/10/2020   PT End of Session - 10/10/20 1756    Visit Number 3    Number of Visits 12    Date for PT Re-Evaluation 11/07/20    Authorization Type [employer] Medicare & Medicaid    PT Start Time 1658    PT Stop Time 1745    PT Time Calculation (min) 47 min    Activity Tolerance Patient tolerated treatment well    Behavior During Therapy [facility] for tasks assessed/performed           Past Medical History:  Diagnosis Date  . Dystonia   . Hypertension   . Thyroid disease     Past Surgical History:  Procedure Laterality Date  . NO PAST SURGERIES      There were no vitals filed for this visit.   Subjective Assessment - 10/10/20 1658    Subjective Pt reports she has no pain but still having trouble with OH movements    Pertinent History vertigo, thyroid disease, HTN, DM, cervical dystonia, bladder stimulator    Diagnostic tests L shoulder MRI 09/09/20: 1. Partial thickness articular surface tear of the posterior supraspinatus tendon. Moderate supraspinatus and infraspinatus tendinopathy with mild distal subscapularis tendinopathy.  2. Mild subacromial subdeltoid bursitis.  3. Mild tendinopathy of the intra-articular segment of the long head of the biceps.  4. Linear accentuated signal traversing the posterosuperior labrum  on images 5-6 of series 3 suspicious for a small labral tear although not well corroborated on other imaging planes.  5. Mild degenerative AC joint and glenohumeral joint arthropathy.  6. Despite efforts by the technologist and patient, motion artifact  is present on today's exam and could not be eliminated. This reduces  exam sensitivity and  specificity.    Currently in Pain? No/denies              [facility] PT Assessment - 10/10/20 0001      AROM   Left Shoulder Flexion 120 Degrees    Left Shoulder ABduction 139 Degrees      Strength   Left Shoulder Flexion 4/5    Left Shoulder ABduction 4/5    Left Shoulder Internal Rotation 4-/5    Left Shoulder External Rotation 4-/5                         [facility] Adult PT Treatment/Exercise - 10/10/20 0001      Exercises   Exercises Shoulder      Shoulder Exercises: Standing   Extension Strengthening;Both;20 reps;Theraband    Theraband Level (Shoulder Extension) Level 2 (Red)    Row Strengthening;Both;Theraband;20 reps    Theraband Level (Shoulder Row) Level 2 (Red)      Shoulder Exercises: ROM/Strengthening   [place] flexion, scaption, crossbody and circles 10x                  PT Education - 10/10/20 1749    Person(s) Educated Patient    Methods Explanation;Demonstration;Verbal cues;Handout    Comprehension Verbalized understanding;Returned demonstration;Verbal cues required;Tactile cues required;Need further instruction            PT Short Term Goals - 10/10/20 1753  PT SHORT TERM GOAL #1   Title Patient will be independent with initial HEP    Status Achieved    Target Date 10/17/20             PT Long Term Goals - 10/10/20 1724      PT LONG TERM GOAL #1   Title Patient will be independent with ongoing/advanced HEP for self-management at home    Status On-going      PT LONG TERM GOAL #2   Title Patient to improve L shoulder AROM to essentitally equivalent to R shoulder without pain provocation    Status On-going      PT LONG TERM GOAL #3   Title Patient will demonstrate improved L shoulder strength to >/= 4 to 4+/5 for functional UE use    Status Partially Met      PT LONG TERM GOAL #4   Title Patient to report ability to perform ADLs and household tasks without limitation due to L shoulder pain, LOM or weakness     Status On-going   still has pain when lifting OH and when sweeping pt stated     PT LONG TERM GOAL #5   Title Patient will report ability to pick up her dog w/o limitation due to L shoulder    Status On-going                 Plan - 10/10/20 1801    Clinical Impression Statement Pt had no pain during treatment, added resisted rows and extensions to HEP with demonstrations and cueing for correct technqiue. Pt still notes trouble with OH reaching and sweeping at home. Pt still reports she is having trouble picking up her dogs w/o pain, her strength has increased but still would benefit from shoulder and scap strengthening epecially ER/IR. Pt has good ROM but is mostly limited by pain. Pt is independent with HEP and reports she does them everyday so has met STG 1. Pt is progressing toward all goals.    Personal Factors and Comorbidities Age;Comorbidity 3+;Fitness;Past/Current Experience;Time since onset of injury/illness/exacerbation    Comorbidities vertigo, thyroid disease, HTN, DM, cervical dystonia, bladder stimulator    PT Frequency 2x / week    PT Duration 6 weeks    PT Treatment/Interventions ADLs/Self Care Home Management;Cryotherapy;Iontophoresis 32m/ml Dexamethasone;Moist Heat;Functional mobility training;Therapeutic activities;Therapeutic exercise;Neuromuscular re-education;Patient/family education;Manual techniques;Passive range of motion;Dry needling;Taping;Vasopneumatic Device    PT Next Visit Plan postural strengthening/stabilization, L shoulder ROM and strengthening    PT Home Exercise Plan MedBridge Access Code: [email or web address])    Consulted and Agree with Plan of Care Patient           Patient will benefit from skilled therapeutic intervention in order to improve the following deficits and impairments:  Decreased activity tolerance,Decreased knowledge of precautions,Decreased range of motion,Decreased strength,Increased fascial restricitons,Increased muscle spasms,Impaired  perceived functional ability,Impaired flexibility,Impaired UE functional use,Improper body mechanics,Postural dysfunction,Pain  Visit Diagnosis: Chronic left shoulder pain  Stiffness of left shoulder, not elsewhere classified  Muscle weakness (generalized)  Abnormal posture  Dizziness and giddiness  Unsteadiness on feet  Cervicalgia     Problem List Patient Active Problem List   Diagnosis Date Noted  . Thyroid disease   . Ganglion cyst 08/25/2020  . Vertigo 12/01/2019  . Cervical dystonia 03/08/2019  . Hyperlipidemia 02/11/2019  . Edema 02/11/2019  . Hypertension 01/19/2019  . Type 2 diabetes mellitus ([place] 01/19/2019  . Hypothyroidism (acquired) 01/19/2019  . Neck pain 01/19/2019  . Dystonia 01/19/2019  .  Adhesive capsulitis of left shoulder 12/22/2018    Artist Pais, PTA 10/10/2020, 6:13 PM  [facility] [address]  [place], [place], [address] Phone: [phone]   Fax:  [phone]  Name: Mozel Burdett MRN: [id number] Date of Birth: 01/11/1931

## 2020-10-10 NOTE — Patient Instructions (Signed)
Access Code: [email or web address] URL: [email or web address] Date: 10/10/2020 Prepared by: Verta Ellen  Exercises Standing Shoulder Row with Anchored Resistance - 1 x daily - 7 x weekly - 2 sets - 10 reps Shoulder extension with resistance - Neutral - 1 x daily - 7 x weekly - 2 sets - 10 reps

## 2020-10-12 ENCOUNTER — Other Ambulatory Visit: Payer: Self-pay

## 2020-10-12 ENCOUNTER — Ambulatory Visit: Payer: Medicare HMO | Admitting: Physical Therapy

## 2020-10-12 ENCOUNTER — Encounter: Payer: Self-pay | Admitting: Physical Therapy

## 2020-10-12 DIAGNOSIS — M25512 Pain in left shoulder: Secondary | ICD-10-CM

## 2020-10-12 DIAGNOSIS — R42 Dizziness and giddiness: Secondary | ICD-10-CM | POA: Diagnosis not present

## 2020-10-12 DIAGNOSIS — R2681 Unsteadiness on feet: Secondary | ICD-10-CM | POA: Diagnosis not present

## 2020-10-12 DIAGNOSIS — R293 Abnormal posture: Secondary | ICD-10-CM | POA: Diagnosis not present

## 2020-10-12 DIAGNOSIS — M6281 Muscle weakness (generalized): Secondary | ICD-10-CM | POA: Diagnosis not present

## 2020-10-12 DIAGNOSIS — M25612 Stiffness of left shoulder, not elsewhere classified: Secondary | ICD-10-CM | POA: Diagnosis not present

## 2020-10-12 DIAGNOSIS — G8929 Other chronic pain: Secondary | ICD-10-CM

## 2020-10-12 DIAGNOSIS — M542 Cervicalgia: Secondary | ICD-10-CM | POA: Diagnosis not present

## 2020-10-12 NOTE — Therapy (Signed)
[facility] [address]  [place], [place], [address] Phone: [phone]   Fax:  [phone]  Physical Therapy Treatment  Patient Details  Name: Leslie Ward MRN: [id number] Date of Birth: 08-13-1930 Referring Provider (PT): Ophelia Charter, MD   Encounter Date: 10/12/2020   PT End of Session - 10/12/20 0857    Visit Number 4    Number of Visits 12    Date for PT Re-Evaluation 11/07/20    Authorization Type [employer] Medicare & Medicaid    PT Start Time 7867   Pt arrived late   PT Stop Time 0932    PT Time Calculation (min) 35 min    Activity Tolerance Patient tolerated treatment well    Behavior During Therapy [facility] for tasks assessed/performed           Past Medical History:  Diagnosis Date  . Dystonia   . Hypertension   . Thyroid disease     Past Surgical History:  Procedure Laterality Date  . NO PAST SURGERIES      There were no vitals filed for this visit.   Subjective Assessment - 10/12/20 0903    Subjective Pt feels that her shoulder is improving - no pain and improving ability to reach overhead.    Pertinent History vertigo, thyroid disease, HTN, DM, cervical dystonia, bladder stimulator    Diagnostic tests L shoulder MRI 09/09/20: 1. Partial thickness articular surface tear of the posterior supraspinatus tendon. Moderate supraspinatus and infraspinatus tendinopathy with mild distal subscapularis tendinopathy.  2. Mild subacromial subdeltoid bursitis.  3. Mild tendinopathy of the intra-articular segment of the long head of the biceps.  4. Linear accentuated signal traversing the posterosuperior labrum  on images 5-6 of series 3 suspicious for a small labral tear although not well corroborated on other imaging planes.  5. Mild degenerative AC joint and glenohumeral joint arthropathy.  6. Despite efforts by the technologist and patient, motion artifact  is present on today's exam and could not be eliminated.  This reduces  exam sensitivity and specificity.    Patient Stated Goals "to feel better"    Currently in Pain? No/denies                             [facility] Adult PT Treatment/Exercise - 10/12/20 0857      Exercises   Exercises Shoulder      Shoulder Exercises: Standing   External Rotation Strengthening;Both;15 reps;Left;10 reps;Theraband    Theraband Level (Shoulder External Rotation) Level 1 (Yellow)    External Rotation Limitations cues for scap retraction & to keep elbows tucked at sides with B ER; neutral shoulder with towel roll under elbow    Internal Rotation Strengthening;Left;10 reps;Theraband    Theraband Level (Shoulder Internal Rotation) Level 1 (Yellow)    Internal Rotation Limitations neutral shoulder with towel roll under elbow    Extension Strengthening;Both;20 reps;Theraband    Theraband Level (Shoulder Extension) Level 2 (Red)    Extension Limitations cues for scap retraction    Row Strengthening;Both;Theraband;20 reps    Theraband Level (Shoulder Row) Level 2 (Red)    Row Limitations cues for scap retraction      Shoulder Exercises: Therapy Ball   Flexion Both;15 reps    Flexion Limitations blue/green striped ball - wall roll-up    Scaption Right;10 reps    Scaption Limitations blue/green striped ball - wall roll-up      Shoulder  Exercises: ROM/Strengthening   UBE (Upper Arm Bike) L1.0 x 6 min (3' fwd/3' back)    Ball on Wall L shoulder CW/CCW circles at 90 flexion 2 x 10    Rhythmic Stabilization, Seated L shoulder at 90 flexion - perturbations with hand on blue/green striped ball                    PT Short Term Goals - 10/12/20 0905      PT SHORT TERM GOAL #1   Title Patient will be independent with initial HEP    Status Achieved   10/10/20            PT Long Term Goals - 10/12/20 0906      PT LONG TERM GOAL #1   Title Patient will be independent with ongoing/advanced HEP for self-management at home    Status  On-going    Target Date 11/07/20      PT LONG TERM GOAL #2   Title Patient to improve L shoulder AROM to essentitally equivalent to R shoulder without pain provocation    Status On-going    Target Date 11/07/20      PT LONG TERM GOAL #3   Title Patient will demonstrate improved L shoulder strength to >/= 4 to 4+/5 for functional UE use    Status Partially Met    Target Date 11/07/20      PT LONG TERM GOAL #4   Title Patient to report ability to perform ADLs and household tasks without limitation due to L shoulder pain, LOM or weakness    Status On-going    Target Date 11/07/20      PT LONG TERM GOAL #5   Title Patient will report ability to pick up her dog w/o limitation due to L shoulder    Status On-going    Target Date 11/07/20                 Plan - 10/12/20 0907    Clinical Impression Statement Leslie Ward reports shoulder pain has been mostly gone lately and notes improving ability to reach overhead. Review recent HEP addition of TB scapular strengthening clarifying proper movement pattern. Worked on overhead ROM with A/AAROM ball roll-ups on wall allowing for slight stretch at top of motion as well as shoulder stabilization exercises using ball on wall with good tolerance. Introduced theraband resisted RTC strengthening but deferred HEP update as pt requiring close supervision/cueing for proper technique. Session completed with pt noting some fatigue but continuing to deny pain.    Personal Factors and Comorbidities Age;Comorbidity 3+;Fitness;Past/Current Experience;Time since onset of injury/illness/exacerbation    Comorbidities vertigo, thyroid disease, HTN, DM, cervical dystonia, bladder stimulator    Examination-Activity Limitations Lift;Carry;Reach Overhead;Bathing;Dressing;Hygiene/Grooming    Examination-Participation Restrictions Cleaning;Laundry;Meal Prep    Rehab Potential Good    PT Frequency 2x / week    PT Duration 6 weeks    PT Treatment/Interventions ADLs/Self  Care Home Management;Cryotherapy;Iontophoresis 87m/ml Dexamethasone;Moist Heat;Functional mobility training;Therapeutic activities;Therapeutic exercise;Neuromuscular re-education;Patient/family education;Manual techniques;Passive range of motion;Dry needling;Taping;Vasopneumatic Device    PT Next Visit Plan postural strengthening/stabilization, L shoulder ROM and strengthening    PT Home Exercise Plan MedBridge Access Code: [email or web address])    Consulted and Agree with Plan of Care Patient           Patient will benefit from skilled therapeutic intervention in order to improve the following deficits and impairments:  Decreased activity tolerance,Decreased knowledge of precautions,Decreased range of motion,Decreased strength,Increased fascial restricitons,Increased muscle spasms,Impaired  perceived functional ability,Impaired flexibility,Impaired UE functional use,Improper body mechanics,Postural dysfunction,Pain  Visit Diagnosis: Chronic left shoulder pain  Stiffness of left shoulder, not elsewhere classified  Muscle weakness (generalized)  Abnormal posture     Problem List Patient Active Problem List   Diagnosis Date Noted  . Thyroid disease   . Ganglion cyst 08/25/2020  . Vertigo 12/01/2019  . Cervical dystonia 03/08/2019  . Hyperlipidemia 02/11/2019  . Edema 02/11/2019  . Hypertension 01/19/2019  . Type 2 diabetes mellitus ([place]) 01/19/2019  . Hypothyroidism (acquired) 01/19/2019  . Neck pain 01/19/2019  . Dystonia 01/19/2019  . Adhesive capsulitis of left shoulder 12/22/2018    Percival Spanish, PT, MPT 10/12/2020, 12:49 PM  [facility] [address]  [place], [place], [address] Phone: [phone]   Fax:  [phone]  Name: Leslie Ward MRN: [id number] Date of Birth: 04-Nov-1930

## 2020-10-13 ENCOUNTER — Ambulatory Visit: Payer: Medicare HMO | Admitting: Cardiology

## 2020-10-17 ENCOUNTER — Ambulatory Visit: Payer: Medicare HMO

## 2020-10-17 ENCOUNTER — Other Ambulatory Visit: Payer: Self-pay

## 2020-10-17 DIAGNOSIS — M6281 Muscle weakness (generalized): Secondary | ICD-10-CM | POA: Diagnosis not present

## 2020-10-17 DIAGNOSIS — M25512 Pain in left shoulder: Secondary | ICD-10-CM

## 2020-10-17 DIAGNOSIS — R42 Dizziness and giddiness: Secondary | ICD-10-CM | POA: Diagnosis not present

## 2020-10-17 DIAGNOSIS — R293 Abnormal posture: Secondary | ICD-10-CM | POA: Diagnosis not present

## 2020-10-17 DIAGNOSIS — G8929 Other chronic pain: Secondary | ICD-10-CM | POA: Diagnosis not present

## 2020-10-17 DIAGNOSIS — M542 Cervicalgia: Secondary | ICD-10-CM | POA: Diagnosis not present

## 2020-10-17 DIAGNOSIS — M25612 Stiffness of left shoulder, not elsewhere classified: Secondary | ICD-10-CM | POA: Diagnosis not present

## 2020-10-17 DIAGNOSIS — R2681 Unsteadiness on feet: Secondary | ICD-10-CM | POA: Diagnosis not present

## 2020-10-17 NOTE — Therapy (Signed)
[facility] [address]  [place], [place], [address] Phone: [phone]   Fax:  [phone]  Physical Therapy Treatment  Patient Details  Name: Leslie Ward MRN: [id number] Date of Birth: March 06, 1931 Referring Provider (PT): Ophelia Charter, MD   Encounter Date: 10/17/2020   PT End of Session - 10/17/20 1752    Visit Number 5    Number of Visits 12    Date for PT Re-Evaluation 11/07/20    Authorization Type Aetna Medicare & Medicaid    PT Start Time 1700    PT Stop Time 1745    PT Time Calculation (min) 45 min    Activity Tolerance Patient tolerated treatment well    Behavior During Therapy [facility] for tasks assessed/performed           Past Medical History:  Diagnosis Date  . Dystonia   . Hypertension   . Thyroid disease     Past Surgical History:  Procedure Laterality Date  . NO PAST SURGERIES      There were no vitals filed for this visit.   Subjective Assessment - 10/17/20 1705    Subjective Pt report she was making her bed yesterday, when her shoulder started hurting    Pertinent History vertigo, thyroid disease, HTN, DM, cervical dystonia, bladder stimulator    Diagnostic tests L shoulder MRI 09/09/20: 1. Partial thickness articular surface tear of the posterior supraspinatus tendon. Moderate supraspinatus and infraspinatus tendinopathy with mild distal subscapularis tendinopathy.  2. Mild subacromial subdeltoid bursitis.  3. Mild tendinopathy of the intra-articular segment of the long head of the biceps.  4. Linear accentuated signal traversing the posterosuperior labrum  on images 5-6 of series 3 suspicious for a small labral tear although not well corroborated on other imaging planes.  5. Mild degenerative AC joint and glenohumeral joint arthropathy.  6. Despite efforts by the technologist and patient, motion artifact  is present on today's exam and could not be eliminated. This reduces  exam sensitivity  and specificity.    Patient Stated Goals "to feel better"    Currently in Pain? No/denies              [facility] PT Assessment - 10/17/20 0001      AROM   Right Shoulder Flexion 145 Degrees    Right Shoulder ABduction 166 Degrees    Left Shoulder Flexion 120 Degrees    Left Shoulder ABduction 141 Degrees      Strength   Right Shoulder Flexion 4/5    Right Shoulder ABduction 4/5    Right Shoulder Internal Rotation 4+/5    Right Shoulder External Rotation 4+/5    Left Shoulder Flexion 4-/5    Left Shoulder ABduction 4-/5    Left Shoulder Internal Rotation 4/5    Left Shoulder External Rotation 4/5                         [facility] Adult PT Treatment/Exercise - 10/17/20 0001      Exercises   Exercises Shoulder      Shoulder Exercises: Seated   Row Strengthening;Both;10 reps;Weights    Row Weight (lbs) 2#    Horizontal ABduction Strengthening;Both;10 reps;Weights    Horizontal ABduction Weight (lbs) 2#      Shoulder Exercises: Therapy Ball   Flexion Both;20 reps    Flexion Limitations physioball    Scaption Left;20 reps    Scaption Limitations physioball  Shoulder Exercises: ROM/Strengthening   Nustep L2 x 7 min                    PT Short Term Goals - 10/12/20 0905      PT SHORT TERM GOAL #1   Title Patient will be independent with initial HEP    Status Achieved   10/10/20            PT Long Term Goals - 10/17/20 1722      PT LONG TERM GOAL #1   Title Patient will be independent with ongoing/advanced HEP for self-management at home    Status Partially Met      PT LONG TERM GOAL #2   Title Patient to improve L shoulder AROM to essentitally equivalent to R shoulder without pain provocation    Status On-going      PT LONG TERM GOAL #3   Title Patient will demonstrate improved L shoulder strength to >/= 4 to 4+/5 for functional UE use    Status Partially Met      PT LONG TERM GOAL #4   Title Patient to report ability to perform  ADLs and household tasks without limitation due to L shoulder pain, LOM or weakness    Status On-going      PT LONG TERM GOAL #5   Title Patient will report ability to pick up her dog w/o limitation due to L shoulder    Status Partially Met                 Plan - 10/17/20 1753    Clinical Impression Statement Pt still has good AROM in her shoulder but flexion is significantly lower than her abduction, her strength has increased but she still needs more strengthening in the flexion and abduction planes. She noted that she can do more reaching but occasionally will get sharp pains when she moves her shoulder wrong.  Did scap strengthening with lots of cues required to correctly perform exercises. She performs her exercises at home but cont to need much cueing to properly perform them during sessions. Pt cont to deny pain throughout session. Progress is being made toward all goals.    Personal Factors and Comorbidities Age;Comorbidity 3+;Fitness;Past/Current Experience;Time since onset of injury/illness/exacerbation    Comorbidities vertigo, thyroid disease, HTN, DM, cervical dystonia, bladder stimulator    PT Frequency 2x / week    PT Duration 6 weeks    PT Treatment/Interventions ADLs/Self Care Home Management;Cryotherapy;Iontophoresis 4mg /ml Dexamethasone;Moist Heat;Functional mobility training;Therapeutic activities;Therapeutic exercise;Neuromuscular re-education;Patient/family education;Manual techniques;Passive range of motion;Dry needling;Taping;Vasopneumatic Device    PT Next Visit Plan postural strengthening/stabilization, L shoulder ROM and strengthening    PT Home Exercise Plan MedBridge Access Code: [email or web address])    Consulted and Agree with Plan of Care Patient           Patient will benefit from skilled therapeutic intervention in order to improve the following deficits and impairments:  Decreased activity tolerance,Decreased knowledge of precautions,Decreased range of  motion,Decreased strength,Increased fascial restricitons,Increased muscle spasms,Impaired perceived functional ability,Impaired flexibility,Impaired UE functional use,Improper body mechanics,Postural dysfunction,Pain  Visit Diagnosis: Chronic left shoulder pain  Stiffness of left shoulder, not elsewhere classified  Muscle weakness (generalized)  Abnormal posture     Problem List Patient Active Problem List   Diagnosis Date Noted  . Thyroid disease   . Ganglion cyst 08/25/2020  . Vertigo 12/01/2019  . Cervical dystonia 03/08/2019  . Hyperlipidemia 02/11/2019  . Edema 02/11/2019  . Hypertension 01/19/2019  .  Type 2 diabetes mellitus ([place]) 01/19/2019  . Hypothyroidism (acquired) 01/19/2019  . Neck pain 01/19/2019  . Dystonia 01/19/2019  . Adhesive capsulitis of left shoulder 12/22/2018    Leslie Ward, Leslie Ward 10/17/2020, 6:14 PM  [facility] [address]  [place], [place], [address] Phone: [phone]   Fax:  [phone]  Name: Leslie Ward MRN: [id number] Date of Birth: 02/12/1931

## 2020-10-19 ENCOUNTER — Encounter: Payer: Self-pay | Admitting: Physical Therapy

## 2020-10-19 ENCOUNTER — Other Ambulatory Visit: Payer: Self-pay

## 2020-10-19 ENCOUNTER — Ambulatory Visit: Payer: Medicare HMO | Admitting: Physical Therapy

## 2020-10-19 DIAGNOSIS — R2681 Unsteadiness on feet: Secondary | ICD-10-CM | POA: Diagnosis not present

## 2020-10-19 DIAGNOSIS — M6281 Muscle weakness (generalized): Secondary | ICD-10-CM

## 2020-10-19 DIAGNOSIS — G8929 Other chronic pain: Secondary | ICD-10-CM | POA: Diagnosis not present

## 2020-10-19 DIAGNOSIS — M25512 Pain in left shoulder: Secondary | ICD-10-CM | POA: Diagnosis not present

## 2020-10-19 DIAGNOSIS — M542 Cervicalgia: Secondary | ICD-10-CM | POA: Diagnosis not present

## 2020-10-19 DIAGNOSIS — R293 Abnormal posture: Secondary | ICD-10-CM | POA: Diagnosis not present

## 2020-10-19 DIAGNOSIS — R42 Dizziness and giddiness: Secondary | ICD-10-CM | POA: Diagnosis not present

## 2020-10-19 DIAGNOSIS — M25612 Stiffness of left shoulder, not elsewhere classified: Secondary | ICD-10-CM | POA: Diagnosis not present

## 2020-10-19 NOTE — Therapy (Signed)
Leslie Ward [address]  [place], [place], [address] Phone: [phone]   Fax:  [phone]  Physical Therapy Treatment  Patient Details  Name: Leslie Ward MRN: [id number] Date of Birth: 1930-10-16 Referring Provider (PT): Leslie Charter, MD   Encounter Date: 10/19/2020   PT End of Session - 10/19/20 0857    Visit Number 6    Number of Visits 12    Date for PT Re-Evaluation 11/07/20    Authorization Type [employer] Medicare & Medicaid    PT Start Time 6384   Pt arrived late   PT Stop Time 0933    PT Time Calculation (min) 36 min    Activity Tolerance Patient tolerated treatment well    Behavior During Therapy [facility] for tasks assessed/performed           Past Medical History:  Diagnosis Date  . Dystonia   . Hypertension   . Thyroid disease     Past Surgical History:  Procedure Laterality Date  . NO PAST SURGERIES      There were no vitals filed for this visit.   Subjective Assessment - 10/19/20 0859    Subjective Pt reporting she has some pain in her shoulder yesterday but no pain today.    Pertinent History vertigo, thyroid disease, HTN, DM, cervical dystonia, bladder stimulator    Diagnostic tests L shoulder MRI 09/09/20: 1. Partial thickness articular surface tear of the posterior supraspinatus tendon. Moderate supraspinatus and infraspinatus tendinopathy with mild distal subscapularis tendinopathy.  2. Mild subacromial subdeltoid bursitis.  3. Mild tendinopathy of the intra-articular segment of the long head of the biceps.  4. Linear accentuated signal traversing the posterosuperior labrum  on images 5-6 of series 3 suspicious for a small labral tear although not well corroborated on other imaging planes.  5. Mild degenerative AC joint and glenohumeral joint arthropathy.  6. Despite efforts by the technologist and patient, motion artifact  is present on today's exam and could not be eliminated. This reduces   exam sensitivity and specificity.    Patient Stated Goals "to feel better"                             [facility] Adult PT Treatment/Exercise - 10/19/20 0857      Exercises   Exercises Shoulder      Shoulder Exercises: Sidelying   External Rotation Strengthening;Left;15 reps;Weights    External Rotation Weight (lbs) 1    External Rotation Limitations neutral shoulder with towel roll under elbow; cues for scap retraction    ABduction Strengthening;Left;15 reps;Weights    ABduction Weight (lbs) 1      Shoulder Exercises: Standing   External Rotation Strengthening;Both;15 reps;Left;5 reps;Theraband    Theraband Level (Shoulder External Rotation) Level 1 (Yellow)    External Rotation Limitations cues for scap retraction & to keep elbows tucked at sides with B ER; neutral shoulder with towel roll under elbow   L single arm deferred d/t c/o increased pain   Internal Rotation Strengthening;Left;15 reps;Theraband    Theraband Level (Shoulder Internal Rotation) Level 1 (Yellow)    Internal Rotation Limitations neutral shoulder with towel roll under elbow    Flexion Strengthening;Left;12 reps;Weights    Shoulder Flexion Weight (lbs) 1    Flexion Limitations cabinet reach to 1st shelf    ABduction Strengthening;Left;12 reps;Weights    Shoulder ABduction Weight (lbs) 1    ABduction Limitations scaption cabinet reach  to 1st shelf      Shoulder Exercises: Pulleys   Flexion 2 minutes    Scaption 2 minutes      Shoulder Exercises: Therapy Ball   Flexion Both;20 reps;Left    Flexion Limitations + L hand lift-off with 1# cuff wt at wrist; orange Pball on wall      Shoulder Exercises: ROM/Strengthening   Nustep L3 x 5 min                    PT Short Term Goals - 10/12/20 0905      PT SHORT TERM GOAL #1   Title Patient will be independent with initial HEP    Status Achieved   10/10/20            PT Long Term Goals - 10/17/20 1722      PT LONG TERM GOAL #1    Title Patient will be independent with ongoing/advanced HEP for self-management at home    Status Partially Met      PT LONG TERM GOAL #2   Title Patient to improve L shoulder AROM to essentitally equivalent to R shoulder without pain provocation    Status On-going      PT LONG TERM GOAL #3   Title Patient will demonstrate improved L shoulder strength to >/= 4 to 4+/5 for functional UE use    Status Partially Met      PT LONG TERM GOAL #4   Title Patient to report ability to perform ADLs and household tasks without limitation due to L shoulder pain, LOM or weakness    Status On-going      PT LONG TERM GOAL #5   Title Patient will report ability to pick up her dog w/o limitation due to L shoulder    Status Partially Met                 Plan - 10/19/20 0902    Clinical Impression Statement [name] continues to note intermittent pain when trying to reach overhead with L shoulder but unable to isolate particular movement pattern that will trigger her pain. Full flexion and abduction ROM demonstrated today with no pain reported, therefore continued L shoulder strengthening adding 1# weights with flexion and scaption motions with good tolerance other than fatigue noted. Pt continues to have difficulty coordinating IR/ER with theraband strengthening with pain noted on L single arm ER, but ER better tolerated in side-lying with 1# although she noted some dizziness from her vertigo upon sitting up.    Personal Factors and Comorbidities Age;Comorbidity 3+;Fitness;Past/Current Experience;Time since onset of injury/illness/exacerbation    Comorbidities vertigo, thyroid disease, HTN, DM, cervical dystonia, bladder stimulator    Rehab Potential Good    PT Frequency 2x / week    PT Duration 6 weeks    PT Treatment/Interventions ADLs/Self Care Home Management;Cryotherapy;Iontophoresis 22m/ml Dexamethasone;Moist Heat;Functional mobility training;Therapeutic activities;Therapeutic  exercise;Neuromuscular re-education;Patient/family education;Manual techniques;Passive range of motion;Dry needling;Taping;Vasopneumatic Device    PT Next Visit Plan postural strengthening/stabilization, L shoulder ROM and strengthening    PT Home Exercise Plan MedBridge Access Code: [email or web address])    Consulted and Agree with Plan of Care Patient           Patient will benefit from skilled therapeutic intervention in order to improve the following deficits and impairments:  Decreased activity tolerance,Decreased knowledge of precautions,Decreased range of motion,Decreased strength,Increased fascial restricitons,Increased muscle spasms,Impaired perceived functional ability,Impaired flexibility,Impaired UE functional use,Improper body mechanics,Postural dysfunction,Pain  Visit Diagnosis: Chronic left shoulder  pain  Stiffness of left shoulder, not elsewhere classified  Muscle weakness (generalized)  Abnormal posture     Problem List Patient Active Problem List   Diagnosis Date Noted  . Thyroid disease   . Ganglion cyst 08/25/2020  . Vertigo 12/01/2019  . Cervical dystonia 03/08/2019  . Hyperlipidemia 02/11/2019  . Edema 02/11/2019  . Hypertension 01/19/2019  . Type 2 diabetes mellitus ([place]) 01/19/2019  . Hypothyroidism (acquired) 01/19/2019  . Neck pain 01/19/2019  . Dystonia 01/19/2019  . Adhesive capsulitis of left shoulder 12/22/2018    Percival Spanish, PT, MPT 10/19/2020, 10:00 AM  [facility] [address]  [address] [place], [place], [address] Phone: [phone]   Fax:  [phone]  Name: Leslie Ward MRN: [id number] Date of Birth: 13-Apr-1931

## 2020-10-24 ENCOUNTER — Ambulatory Visit: Payer: Medicare HMO

## 2020-10-24 ENCOUNTER — Other Ambulatory Visit: Payer: Self-pay

## 2020-10-24 DIAGNOSIS — M25512 Pain in left shoulder: Secondary | ICD-10-CM | POA: Diagnosis not present

## 2020-10-24 DIAGNOSIS — M6281 Muscle weakness (generalized): Secondary | ICD-10-CM

## 2020-10-24 DIAGNOSIS — M25612 Stiffness of left shoulder, not elsewhere classified: Secondary | ICD-10-CM | POA: Diagnosis not present

## 2020-10-24 DIAGNOSIS — M542 Cervicalgia: Secondary | ICD-10-CM | POA: Diagnosis not present

## 2020-10-24 DIAGNOSIS — R2681 Unsteadiness on feet: Secondary | ICD-10-CM | POA: Diagnosis not present

## 2020-10-24 DIAGNOSIS — R293 Abnormal posture: Secondary | ICD-10-CM | POA: Diagnosis not present

## 2020-10-24 DIAGNOSIS — G8929 Other chronic pain: Secondary | ICD-10-CM

## 2020-10-24 DIAGNOSIS — R42 Dizziness and giddiness: Secondary | ICD-10-CM | POA: Diagnosis not present

## 2020-10-24 NOTE — Therapy (Signed)
[facility] [address]  [place], [place], [address] Phone: [phone]   Fax:  [phone]  Physical Therapy Treatment  Patient Details  Name: Leslie Ward MRN: [id number] Date of Birth: 11-10-1930 Referring Provider (PT): Ophelia Charter, MD   Encounter Date: 10/24/2020   PT End of Session - 10/24/20 1830    Visit Number 7    Number of Visits 12    Date for PT Re-Evaluation 11/07/20    Authorization Type Aetna Medicare & Medicaid    PT Start Time 1700    PT Stop Time 1744    PT Time Calculation (min) 44 min    Activity Tolerance Patient tolerated treatment well    Behavior During Therapy [facility] for tasks assessed/performed           Past Medical History:  Diagnosis Date  . Dystonia   . Hypertension   . Thyroid disease     Past Surgical History:  Procedure Laterality Date  . NO PAST SURGERIES      There were no vitals filed for this visit.   Subjective Assessment - 10/24/20 1707    Subjective Pt says she had a little pain yesterday but is feeling well.    Pertinent History vertigo, thyroid disease, HTN, DM, cervical dystonia, bladder stimulator    Diagnostic tests L shoulder MRI 09/09/20: 1. Partial thickness articular surface tear of the posterior supraspinatus tendon. Moderate supraspinatus and infraspinatus tendinopathy with mild distal subscapularis tendinopathy.  2. Mild subacromial subdeltoid bursitis.  3. Mild tendinopathy of the intra-articular segment of the long head of the biceps.  4. Linear accentuated signal traversing the posterosuperior labrum  on images 5-6 of series 3 suspicious for a small labral tear although not well corroborated on other imaging planes.  5. Mild degenerative AC joint and glenohumeral joint arthropathy.  6. Despite efforts by the technologist and patient, motion artifact  is present on today's exam and could not be eliminated. This reduces  exam sensitivity and specificity.     Patient Stated Goals "to feel better"    Currently in Pain? No/denies                             [facility] Adult PT Treatment/Exercise - 10/24/20 0001      Exercises   Exercises Shoulder      Shoulder Exercises: Seated   Flexion Strengthening;Left;20 reps;Weights    Flexion Weight (lbs) 1    Abduction Strengthening;Left;20 reps;Weights    ABduction Weight (lbs) 1      Shoulder Exercises: Standing   External Rotation Strengthening;Left;10 reps;Theraband    Theraband Level (Shoulder External Rotation) Level 2 (Red)    External Rotation Limitations cueing for technique    Internal Rotation Strengthening;Left;10 reps;Theraband    Theraband Level (Shoulder Internal Rotation) Level 2 (Red)    Internal Rotation Limitations cuein for technique      Shoulder Exercises: Pulleys   Flexion 3 minutes    Scaption 3 minutes      Shoulder Exercises: ROM/Strengthening   Nustep L 4 x 6 min    Wall [place] flexion, scaption, crossbody and circles 10x each                    PT Short Term Goals - 10/12/20 0905      PT SHORT TERM GOAL #1   Title Patient will be independent with initial HEP  Status Achieved   10/10/20            PT Long Term Goals - 10/17/20 1722      PT LONG TERM GOAL #1   Title Patient will be independent with ongoing/advanced HEP for self-management at home    Status Partially Met      PT LONG TERM GOAL #2   Title Patient to improve L shoulder AROM to essentitally equivalent to R shoulder without pain provocation    Status On-going      PT LONG TERM GOAL #3   Title Patient will demonstrate improved L shoulder strength to >/= 4 to 4+/5 for functional UE use    Status Partially Met      PT LONG TERM GOAL #4   Title Patient to report ability to perform ADLs and household tasks without limitation due to L shoulder pain, LOM or weakness    Status On-going      PT LONG TERM GOAL #5   Title Patient will report ability to pick up her dog  w/o limitation due to L shoulder    Status Partially Met                 Plan - 10/24/20 1830    Clinical Impression Statement Pt cont to respond well to treatment. She had mild c/o fatigue after the shld flex and abduction with weight but reported she had no pain. She needed a lot of cueing during the ER/IR exercises for keeping the elbow tucked into the side and not rotation the trunk. She notes that her ability to reach [facility] has gotten better but still at times she will have some pain when reaching. She could benefit from more scap stab exercises along with RTC strengthening to improve her shoulder meachanics and for more stabilization during OH movements.    Personal Factors and Comorbidities Age;Comorbidity 3+;Fitness;Past/Current Experience;Time since onset of injury/illness/exacerbation    PT Frequency 2x / week    PT Duration 6 weeks    PT Treatment/Interventions ADLs/Self Care Home Management;Cryotherapy;Iontophoresis 4mg /ml Dexamethasone;Moist Heat;Functional mobility training;Therapeutic activities;Therapeutic exercise;Neuromuscular re-education;Patient/family education;Manual techniques;Passive range of motion;Dry needling;Taping;Vasopneumatic Device    PT Next Visit Plan postural strengthening/stabilization, L shoulder ROM and strengthening    PT Home Exercise Plan MedBridge Access Code: [email or web address] (2/15)    Consulted and Agree with Plan of Care Patient           Patient will benefit from skilled therapeutic intervention in order to improve the following deficits and impairments:  Decreased activity tolerance,Decreased knowledge of precautions,Decreased range of motion,Decreased strength,Increased fascial restricitons,Increased muscle spasms,Impaired perceived functional ability,Impaired flexibility,Impaired UE functional use,Improper body mechanics,Postural dysfunction,Pain  Visit Diagnosis: Chronic left shoulder pain  Stiffness of left shoulder, not elsewhere  classified  Muscle weakness (generalized)  Abnormal posture     Problem List Patient Active Problem List   Diagnosis Date Noted  . Thyroid disease   . Ganglion cyst 08/25/2020  . Vertigo 12/01/2019  . Cervical dystonia 03/08/2019  . Hyperlipidemia 02/11/2019  . Edema 02/11/2019  . Hypertension 01/19/2019  . Type 2 diabetes mellitus ([place]) 01/19/2019  . Hypothyroidism (acquired) 01/19/2019  . Neck pain 01/19/2019  . Dystonia 01/19/2019  . Adhesive capsulitis of left shoulder 12/22/2018    Artist Pais, PTA 10/24/2020, 6:36 PM  [facility] [address]  [place], [place], [address] Phone: [phone]   Fax:  [phone]  Name: Leslie Ward MRN: [id number] Date of Birth:  06/02/1931   

## 2020-10-25 ENCOUNTER — Ambulatory Visit: Payer: Medicare Other | Admitting: Neurology

## 2020-10-25 ENCOUNTER — Other Ambulatory Visit: Payer: Self-pay | Admitting: Medical

## 2020-10-26 ENCOUNTER — Encounter: Payer: Self-pay | Admitting: Physical Therapy

## 2020-10-26 ENCOUNTER — Ambulatory Visit: Payer: Medicare HMO | Admitting: Physical Therapy

## 2020-10-26 ENCOUNTER — Other Ambulatory Visit: Payer: Self-pay

## 2020-10-26 DIAGNOSIS — M25612 Stiffness of left shoulder, not elsewhere classified: Secondary | ICD-10-CM | POA: Diagnosis not present

## 2020-10-26 DIAGNOSIS — G8929 Other chronic pain: Secondary | ICD-10-CM

## 2020-10-26 DIAGNOSIS — M6281 Muscle weakness (generalized): Secondary | ICD-10-CM

## 2020-10-26 DIAGNOSIS — M25512 Pain in left shoulder: Secondary | ICD-10-CM

## 2020-10-26 DIAGNOSIS — R293 Abnormal posture: Secondary | ICD-10-CM | POA: Diagnosis not present

## 2020-10-26 DIAGNOSIS — R2681 Unsteadiness on feet: Secondary | ICD-10-CM | POA: Diagnosis not present

## 2020-10-26 DIAGNOSIS — R42 Dizziness and giddiness: Secondary | ICD-10-CM | POA: Diagnosis not present

## 2020-10-26 DIAGNOSIS — M542 Cervicalgia: Secondary | ICD-10-CM | POA: Diagnosis not present

## 2020-10-26 NOTE — Therapy (Signed)
[facility] [address]  [place], [place], [address] Phone: [phone]   Fax:  [phone]  Physical Therapy Treatment  Patient Details  Name: Leslie Ward MRN: [id number] Date of Birth: 08-11-1931 Referring Provider (PT): Ophelia Charter, MD   Encounter Date: 10/26/2020   PT End of Session - 10/26/20 0855    Visit Number 8    Number of Visits 12    Date for PT Re-Evaluation 11/07/20    Authorization Type Aetna Medicare & Medicaid    PT Start Time [phone]   Pt arrived late   PT Stop Time 0935    PT Time Calculation (min) 40 min    Activity Tolerance Patient tolerated treatment well    Behavior During Therapy [facility] for tasks assessed/performed           Past Medical History:  Diagnosis Date  . Dystonia   . Hypertension   . Thyroid disease     Past Surgical History:  Procedure Laterality Date  . NO PAST SURGERIES      There were no vitals filed for this visit.   Subjective Assessment - 10/26/20 0859    Subjective Pt denies pain today but notes her shoulder was hurting yesterday when she tried to reach up.    Pertinent History vertigo, thyroid disease, HTN, DM, cervical dystonia, bladder stimulator    Diagnostic tests L shoulder MRI 09/09/20: 1. Partial thickness articular surface tear of the posterior supraspinatus tendon. Moderate supraspinatus and infraspinatus tendinopathy with mild distal subscapularis tendinopathy.  2. Mild subacromial subdeltoid bursitis.  3. Mild tendinopathy of the intra-articular segment of the long head of the biceps.  4. Linear accentuated signal traversing the posterosuperior labrum  on images 5-6 of series 3 suspicious for a small labral tear although not well corroborated on other imaging planes.  5. Mild degenerative AC joint and glenohumeral joint arthropathy.  6. Despite efforts by the technologist and patient, motion artifact  is present on today's exam and could not be  eliminated. This reduces  exam sensitivity and specificity.    Patient Stated Goals "to feel better"    Currently in Pain? No/denies    Aggravating Factors  lifting the matress to make her bed - pain brief and goes away quickly    Effect of Pain on Daily Activities has to use R arm more than L              [facility] PT Assessment - 10/26/20 0855      Assessment   Medical Diagnosis L shoulder OA    Referring Provider (PT) Ophelia Charter, MD    Onset Date/Surgical Date --   4 yrs   Hand Dominance Right    Next MD Visit unsure      AROM   Right Shoulder Flexion 147 Degrees    Right Shoulder ABduction 161 Degrees    Right Shoulder Internal Rotation --   FIR to bra strap   Right Shoulder External Rotation --   FER to T1   Left Shoulder Flexion 157 Degrees    Left Shoulder ABduction 145 Degrees    Left Shoulder Internal Rotation --   FIR to bra strap   Left Shoulder External Rotation 64 Degrees   FER to T1     Strength   Right Shoulder Flexion 4/5    Right Shoulder ABduction 4/5    Right Shoulder Internal Rotation 4+/5    Right Shoulder External Rotation 4/5  Left Shoulder Flexion 4/5    Left Shoulder ABduction 4/5    Left Shoulder Internal Rotation 4+/5    Left Shoulder External Rotation 4/5                         [facility] Adult PT Treatment/Exercise - 10/26/20 0855      Exercises   Exercises Shoulder      Shoulder Exercises: Standing   External Rotation Both;15 reps;Strengthening;Theraband    Theraband Level (Shoulder External Rotation) Level 2 (Red)    External Rotation Limitations cues for scap retraction & to keep elbows tucked at sides with B ER    Extension Both;15 reps;Strengthening;Theraband    Theraband Level (Shoulder Extension) Level 2 (Red)    Extension Limitations cues for scap retraction    Row Both;15 reps;Strengthening;Theraband    Theraband Level (Shoulder Row) Level 2 (Red)    Row Limitations cues for scap retraction      Shoulder  Exercises: ROM/Strengthening   Nustep L4 x 6 min    Ball on Wall L shoulder CW/CCW circles at 90 flexion 2 x 10    Rhythmic Stabilization, Seated L shoulder at 90 flexion - perturbations with hand on blue/green striped ball                  PT Education - 10/26/20 0930    Education Details HEP update - Access Code: [email or web address]    Person(s) Educated Patient    Methods Explanation;Demonstration;Verbal cues;Handout    Comprehension Verbalized understanding;Verbal cues required;Returned demonstration;Need further instruction            PT Short Term Goals - 10/12/20 0905      PT SHORT TERM GOAL #1   Title Patient will be independent with initial HEP    Status Achieved   10/10/20            PT Long Term Goals - 10/26/20 0903      PT LONG TERM GOAL #1   Title Patient will be independent with ongoing/advanced HEP for self-management at home    Status Partially Met    Target Date 11/07/20      PT LONG TERM GOAL #2   Title Patient to improve L shoulder AROM to essentitally equivalent to R shoulder without pain provocation    Status Achieved   10/26/20   Target Date --      PT LONG TERM GOAL #3   Title Patient will demonstrate improved L shoulder strength to >/= 4 to 4+/5 for functional UE use    Status Achieved   10/26/20   Target Date --      PT LONG TERM GOAL #4   Title Patient to report ability to perform ADLs and household tasks without limitation due to L shoulder pain, LOM or weakness    Status Partially Met    Target Date 11/07/20      PT LONG TERM GOAL #5   Title Patient will report ability to pick up her dog w/o limitation due to L shoulder    Status Partially Met    Target Date 11/07/20                 Plan - 10/26/20 0903    Clinical Impression Statement Izadora reports her L shoulder pain has been much better and when it does occur it goes away quickly. Her L shoulder ROM is now essentially symmetrical to her R with no pain reported and L  shoulder  strength is symmetrical to her R. She continues to demonstrate more forward positioning of her L shoulder which increases the risk for impingement, therefore continued scapular retraction and stabilization strengthening with good tolerance but cues still necessary for proper technique/movement patterns. She is progressing well toward her goals and anticipate she will be ready to transition to her HEP by the end of her current POC.    Comorbidities vertigo, thyroid disease, HTN, DM, cervical dystonia, bladder stimulator    Rehab Potential Good    PT Frequency 2x / week    PT Duration 6 weeks    PT Treatment/Interventions ADLs/Self Care Home Management;Cryotherapy;Iontophoresis 31m/ml Dexamethasone;Moist Heat;Functional mobility training;Therapeutic activities;Therapeutic exercise;Neuromuscular re-education;Patient/family education;Manual techniques;Passive range of motion;Dry needling;Taping;Vasopneumatic Device    PT Next Visit Plan postural strengthening/stabilization, L shoulder ROM and strengthening    PT Home Exercise Plan MedBridge Access Code: [email or web address](2/15); [email or web address](3/1); [email or web address](3/17)    Consulted and Agree with Plan of Care Patient           Patient will benefit from skilled therapeutic intervention in order to improve the following deficits and impairments:  Decreased activity tolerance,Decreased knowledge of precautions,Decreased range of motion,Decreased strength,Increased fascial restricitons,Increased muscle spasms,Impaired perceived functional ability,Impaired flexibility,Impaired UE functional use,Improper body mechanics,Postural dysfunction,Pain  Visit Diagnosis: Chronic left shoulder pain  Stiffness of left shoulder, not elsewhere classified  Muscle weakness (generalized)  Abnormal posture     Problem List Patient Active Problem List   Diagnosis Date Noted  . Thyroid disease   . Ganglion cyst 08/25/2020  . Vertigo 12/01/2019  . Cervical dystonia 03/08/2019  .  Hyperlipidemia 02/11/2019  . Edema 02/11/2019  . Hypertension 01/19/2019  . [place] 01/19/2019  . Hypothyroidism (acquired) 01/19/2019  . Neck pain 01/19/2019  . Dystonia 01/19/2019  . Adhesive capsulitis of left shoulder 12/22/2018    JPercival Spanish PT, MPT 10/26/2020, 12:40 PM  [facility] [place] [address]Phone: [phone]  Fax:  [phone] Name: FSimara RhynerMRN: [id number] of Birth: 2June 08, 1932

## 2020-10-26 NOTE — Patient Instructions (Signed)
  Access Code: [email or web address] URL: [email or web address] Date: 10/26/2020 Prepared by: Glenetta Hew  Exercises Shoulder External Rotation and Scapular Retraction with Resistance - 1 x daily - 7 x weekly - 2 sets - 10 reps - 3-5 sec hold

## 2020-10-31 ENCOUNTER — Other Ambulatory Visit: Payer: Self-pay

## 2020-10-31 ENCOUNTER — Ambulatory Visit: Payer: Medicare HMO

## 2020-10-31 DIAGNOSIS — R42 Dizziness and giddiness: Secondary | ICD-10-CM | POA: Diagnosis not present

## 2020-10-31 DIAGNOSIS — M25612 Stiffness of left shoulder, not elsewhere classified: Secondary | ICD-10-CM | POA: Diagnosis not present

## 2020-10-31 DIAGNOSIS — M25512 Pain in left shoulder: Secondary | ICD-10-CM

## 2020-10-31 DIAGNOSIS — M6281 Muscle weakness (generalized): Secondary | ICD-10-CM

## 2020-10-31 DIAGNOSIS — R293 Abnormal posture: Secondary | ICD-10-CM | POA: Diagnosis not present

## 2020-10-31 DIAGNOSIS — M542 Cervicalgia: Secondary | ICD-10-CM | POA: Diagnosis not present

## 2020-10-31 DIAGNOSIS — G8929 Other chronic pain: Secondary | ICD-10-CM | POA: Diagnosis not present

## 2020-10-31 DIAGNOSIS — R2681 Unsteadiness on feet: Secondary | ICD-10-CM | POA: Diagnosis not present

## 2020-10-31 NOTE — Therapy (Signed)
[facility] [address]  [place], [place], [address] Phone: [phone]   Fax:  [phone]  Physical Therapy Treatment  Patient Details  Name: Leslie Ward MRN: [id number] Date of Birth: May 21, 1931 Referring Provider (PT): Ophelia Charter, MD   Encounter Date: 10/31/2020   PT End of Session - 10/31/20 1752    Visit Number 9    Number of Visits 12    Date for PT Re-Evaluation 11/07/20    Authorization Type [employer] Medicare & Medicaid    PT Start Time 1603    PT Stop Time 1644    PT Time Calculation (min) 41 min    Activity Tolerance Patient tolerated treatment well    Behavior During Therapy [facility] for tasks assessed/performed           Past Medical History:  Diagnosis Date  . Dystonia   . Hypertension   . Thyroid disease     Past Surgical History:  Procedure Laterality Date  . NO PAST SURGERIES      There were no vitals filed for this visit.   Subjective Assessment - 10/31/20 1706    Subjective Pt reports she is doing better with OH movements with less pain.    Pertinent History vertigo, thyroid disease, HTN, DM, cervical dystonia, bladder stimulator    Diagnostic tests L shoulder MRI 09/09/20: 1. Partial thickness articular surface tear of the posterior supraspinatus tendon. Moderate supraspinatus and infraspinatus tendinopathy with mild distal subscapularis tendinopathy.  2. Mild subacromial subdeltoid bursitis.  3. Mild tendinopathy of the intra-articular segment of the long head of the biceps.  4. Linear accentuated signal traversing the posterosuperior labrum  on images 5-6 of series 3 suspicious for a small labral tear although not well corroborated on other imaging planes.  5. Mild degenerative AC joint and glenohumeral joint arthropathy.  6. Despite efforts by the technologist and patient, motion artifact  is present on today's exam and could not be eliminated. This reduces  exam sensitivity and  specificity.    Patient Stated Goals "to feel better"    Currently in Pain? No/denies                             [facility] Adult PT Treatment/Exercise - 10/31/20 0001      Shoulder Exercises: Standing   External Rotation Strengthening;Left;10 reps;Theraband    Theraband Level (Shoulder External Rotation) Level 2 (Red)    External Rotation Limitations towel in between; a lot of cues for technique    Internal Rotation Strengthening;Left;20 reps;Theraband    Theraband Level (Shoulder Internal Rotation) Level 2 (Red)    Internal Rotation Limitations towel in between    Flexion Strengthening;Both;10 reps;Weights    Shoulder Flexion Weight (lbs) 2    ABduction Strengthening;Both;10 reps;Weights    Shoulder ABduction Weight (lbs) 2      Shoulder Exercises: ROM/Strengthening   Nustep L3x35mn    Lat Pull --   10# 10 reps   Cybex Row --   5# 10 reps   Wall Wash flexion, scaption, crossbody and circles both ways 10x each                    PT Short Term Goals - 10/12/20 0905      PT SHORT TERM GOAL #1   Title Patient will be independent with initial HEP    Status Achieved   10/10/20  PT Long Term Goals - 10/26/20 0903      PT LONG TERM GOAL #1   Title Patient will be independent with ongoing/advanced HEP for self-management at home    Status Partially Met    Target Date 11/07/20      PT LONG TERM GOAL #2   Title Patient to improve L shoulder AROM to essentitally equivalent to R shoulder without pain provocation    Status Achieved   10/26/20   Target Date --      PT LONG TERM GOAL #3   Title Patient will demonstrate improved L shoulder strength to >/= 4 to 4+/5 for functional UE use    Status Achieved   10/26/20   Target Date --      PT LONG TERM GOAL #4   Title Patient to report ability to perform ADLs and household tasks without limitation due to L shoulder pain, LOM or weakness    Status Partially Met    Target Date 11/07/20      PT  LONG TERM GOAL #5   Title Patient will report ability to pick up her dog w/o limitation due to L shoulder    Status Partially Met    Target Date 11/07/20                 Plan - 10/31/20 1753    Clinical Impression Statement Pt responded well to treatment. Cont'd with some shoulder strengthening along with scap stability to improve mechanics of the shoulder complex. She had some fatigue with the resisted shoulder abduction towards the end, also needed a lot of cueing during the ER/IR exercises to prevent any substitutions with trunk and to isolate her forearm. She showed cont'd good shoulder ROM with OH movements and no c/o pain with the exercises. Will plan to update pt HEP next session and review any exercises to ensure proper understanding and promote possible transition to home program.    Personal Factors and Comorbidities Age;Comorbidity 3+;Fitness;Past/Current Experience;Time since onset of injury/illness/exacerbation    Comorbidities vertigo, thyroid disease, HTN, DM, cervical dystonia, bladder stimulator    PT Frequency 2x / week    PT Duration 6 weeks    PT Treatment/Interventions ADLs/Self Care Home Management;Cryotherapy;Iontophoresis 40m/ml Dexamethasone;Moist Heat;Functional mobility training;Therapeutic activities;Therapeutic exercise;Neuromuscular re-education;Patient/family education;Manual techniques;Passive range of motion;Dry needling;Taping;Vasopneumatic Device    PT Next Visit Plan Review/update HEP, postural strengthening/stabilization, L shoulder ROM and strengthening    PT Home Exercise Plan MedBridge Access Code: [email or web address](2/15); [email or web address](3/1); [email or web address](3/17)    Consulted and Agree with Plan of Care Patient           Patient will benefit from skilled therapeutic intervention in order to improve the following deficits and impairments:  Decreased activity tolerance,Decreased knowledge of precautions,Decreased range of motion,Decreased strength,Increased fascial  restricitons,Increased muscle spasms,Impaired perceived functional ability,Impaired flexibility,Impaired UE functional use,Improper body mechanics,Postural dysfunction,Pain  Visit Diagnosis: Chronic left shoulder pain  Stiffness of left shoulder, not elsewhere classified  Muscle weakness (generalized)  Abnormal posture     Problem List Patient Active Problem List   Diagnosis Date Noted  . Thyroid disease   . Ganglion cyst 08/25/2020  . Vertigo 12/01/2019  . Cervical dystonia 03/08/2019  . Hyperlipidemia 02/11/2019  . Edema 02/11/2019  . Hypertension 01/19/2019  . [place] 2 diabetes [place] 01/19/2019  . Hypothyroidism (acquired) 01/19/2019  . Neck pain 01/19/2019  . Dystonia 01/19/2019  . Adhesive capsulitis of left shoulder 12/22/2018    BArtist Pais PTA 10/31/2020, 6:03 PM  [facility] [address]  [place], [place], [address] Phone: [phone]   Fax:  [phone]  Name: Leslie Ward MRN: [id number] Date of Birth: March 09, 1931

## 2020-11-02 ENCOUNTER — Encounter: Payer: Medicare HMO | Admitting: Physical Therapy

## 2020-11-07 ENCOUNTER — Other Ambulatory Visit: Payer: Self-pay

## 2020-11-07 ENCOUNTER — Ambulatory Visit: Payer: Medicare HMO

## 2020-11-07 DIAGNOSIS — M25612 Stiffness of left shoulder, not elsewhere classified: Secondary | ICD-10-CM

## 2020-11-07 DIAGNOSIS — M25512 Pain in left shoulder: Secondary | ICD-10-CM | POA: Diagnosis not present

## 2020-11-07 DIAGNOSIS — R293 Abnormal posture: Secondary | ICD-10-CM | POA: Diagnosis not present

## 2020-11-07 DIAGNOSIS — R42 Dizziness and giddiness: Secondary | ICD-10-CM | POA: Diagnosis not present

## 2020-11-07 DIAGNOSIS — G8929 Other chronic pain: Secondary | ICD-10-CM

## 2020-11-07 DIAGNOSIS — M542 Cervicalgia: Secondary | ICD-10-CM | POA: Diagnosis not present

## 2020-11-07 DIAGNOSIS — M6281 Muscle weakness (generalized): Secondary | ICD-10-CM | POA: Diagnosis not present

## 2020-11-07 DIAGNOSIS — R2681 Unsteadiness on feet: Secondary | ICD-10-CM | POA: Diagnosis not present

## 2020-11-07 NOTE — Addendum Note (Signed)
Addended by: Marry Guan on: 11/07/2020 06:56 PM   Modules accepted: Orders

## 2020-11-07 NOTE — Therapy (Addendum)
[facility] [address]  [place], [place], [address] Phone: [phone]   Fax:  [phone]  Physical Therapy Treatment / Recert / Progress Note  Patient Details  Name: Leslie Ward MRN: [id number] Date of Birth: 11/10/1930 Referring Provider (PT): Ophelia Charter, MD  Progress Note  Reporting Period 09/26/2020 to 11/07/2020  See note below for Objective Data and Assessment of Progress/Goals.      Encounter Date: 11/07/2020   PT End of Session - 11/07/20 1757    Visit Number 10    Number of Visits 14    Date for PT Re-Evaluation 11/07/20    Authorization Type [employer]na Medicare & Medicaid    PT Start Time 1604    PT Stop Time 1643    PT Time Calculation (min) 39 min    Activity Tolerance Patient tolerated treatment well    Behavior During Therapy WFL for tasks assessed/performed           Past Medical History:  Diagnosis Date  . Dystonia   . Hypertension   . Thyroid disease     Past Surgical History:  Procedure Laterality Date  . NO PAST SURGERIES      There were no vitals filed for this visit.   Subjective Assessment - 11/07/20 1713    Subjective Pt notes some pain in her shoulder yesterday when trying to move, feels better today.    Pertinent History vertigo, thyroid disease, HTN, DM, cervical dystonia, bladder stimulator    Diagnostic tests L shoulder MRI 09/09/20: 1. Partial thickness articular surface tear of the posterior supraspinatus tendon. Moderate supraspinatus and infraspinatus tendinopathy with mild distal subscapularis tendinopathy.  2. Mild subacromial subdeltoid bursitis.  3. Mild tendinopathy of the intra-articular segment of the long head of the biceps.  4. Linear accentuated signal traversing the posterosuperior labrum  on images 5-6 of series 3 suspicious for a small labral tear although not well corroborated on other imaging planes.  5. Mild degenerative AC joint and glenohumeral joint  arthropathy.  6. Despite efforts by the technologist and patient, motion artifact  is present on today's exam and could not be eliminated. This reduces  exam sensitivity and specificity.    Patient Stated Goals "to feel better"    Currently in Pain? No/denies              [facility] PT Assessment - 11/07/20 0001      Assessment   Medical Diagnosis L shoulder OA    Referring Provider (PT) Ophelia Charter, MD    Onset Date/Surgical Date --   4 years     AROM   Right Shoulder Flexion 150 Degrees    Right Shoulder ABduction 160 Degrees    Left Shoulder Flexion 150 Degrees   seated   Left Shoulder ABduction 142 Degrees   seated     Strength   Right Shoulder Flexion 4+/5    Right Shoulder ABduction 4+/5    Right Shoulder Internal Rotation 4+/5    Right Shoulder External Rotation 4+/5    Left Shoulder Flexion 4+/5    Left Shoulder ABduction 4+/5    Left Shoulder Internal Rotation 4+/5    Left Shoulder External Rotation 4/5                                   PT Short Term Goals - 10/12/20 2409      PT  SHORT TERM GOAL #1   Title Patient will be independent with initial HEP    Status Achieved   10/10/20            PT Long Term Goals - 11/07/20 1744      PT LONG TERM GOAL #1   Title Patient will be independent with ongoing/advanced HEP for self-management at home    Status Achieved      PT LONG TERM GOAL #2   Title Patient to improve L shoulder AROM to essentitally equivalent to R shoulder without pain provocation    Status Partially Met   11/08/27 - Pt still notes limitation with FIR behind back   Target Date 12/05/20      PT LONG TERM GOAL #3   Title Patient will demonstrate improved L shoulder strength to >/= 4 to 4+/5 for functional UE use    Status Achieved   10/26/20     PT LONG TERM GOAL #4   Title Patient to report ability to perform ADLs and household tasks without limitation due to L shoulder pain, LOM or weakness    Status Partially Met    11/07/20: Pt notes difficulty with clasping her bra behind her back   Target Date 12/05/20      PT LONG TERM GOAL #5   Title Patient will report ability to pick up her dog w/o limitation due to L shoulder    Status Partially Met    Target Date 12/05/20                 Plan - 11/07/20 1758    Clinical Impression Statement Cathyann has met her STG and LTGs #1 & 3 met with remaining LTGs partially met. She is independent with her current HEP and shows good demonstration of exercises. Her L shoulder AROM is WFL  w/o pain and similar to the R shoulder except limitation noted in FIR behind her back. Her strength is still globally 4/5 and above w/o pain. She has better ability to reach OH into cabinets and to wash windows w/o pain or limitations d/t weakness or pain, but notes difficulty reaching behind her back to fasten her bra. She still has trouble picking up her dog, as she stated it as improved but at times can still be difficult. She also reported that she would like to work on more behind the back and IR movements to help with putting her bra on as she is having some difficulty with this. Jailani would benefit from continued PT to address deficits in behind the back reaching to put her bra on and her ability to pick up her dog w/o being limited by pain.    Personal Factors and Comorbidities Age;Comorbidity 3+;Fitness;Past/Current Experience;Time since onset of injury/illness/exacerbation    Comorbidities vertigo, thyroid disease, HTN, DM, cervical dystonia, bladder stimulator    Examination-Activity Limitations Dressing;Lift    Rehab Potential Good    PT Frequency 1x / week    PT Duration 4 weeks    PT Treatment/Interventions ADLs/Self Care Home Management;Cryotherapy;Iontophoresis 4mg/ml Dexamethasone;Moist Heat;Functional mobility training;Therapeutic activities;Therapeutic exercise;Neuromuscular re-education;Patient/family education;Manual techniques;Passive range of motion;Dry  needling;Taping;Vasopneumatic Device    PT Next Visit Plan Review/update HEP, focus on behind back motion/IR, postural strengthening/stabilization, L shoulder ROM and strengthening    PT Home Exercise Plan MedBridge Access Code: [email or web address] (2/15); [email or web address] (3/1); [email or web address] (3/17)    Consulted and Agree with Plan of Care Patient           Patient will benefit from   skilled therapeutic intervention in order to improve the following deficits and impairments:  Decreased activity tolerance,Decreased knowledge of precautions,Decreased range of motion,Decreased strength,Increased fascial restricitons,Increased muscle spasms,Impaired perceived functional ability,Impaired flexibility,Impaired UE functional use,Improper body mechanics,Postural dysfunction,Pain  Visit Diagnosis: Chronic left shoulder pain  Stiffness of left shoulder, not elsewhere classified  Muscle weakness (generalized)  Abnormal posture     Problem List Patient Active Problem List   Diagnosis Date Noted  . Thyroid disease   . Ganglion cyst 08/25/2020  . Vertigo 12/01/2019  . Cervical dystonia 03/08/2019  . Hyperlipidemia 02/11/2019  . Edema 02/11/2019  . Hypertension 01/19/2019  . Type 2 diabetes mellitus ([place]) 01/19/2019  . Hypothyroidism (acquired) 01/19/2019  . Neck pain 01/19/2019  . Dystonia 01/19/2019  . Adhesive capsulitis of left shoulder 12/22/2018    Artist Pais, PTA 11/07/2020, 6:52 PM  [facility] [address]  [place], [place], [address] Phone: [phone]   Fax:  [phone]  Name: Hayla Hinger MRN: [id number] Date of Birth: [birth year]   Itzell has demonstrated good initial progress with PT with improved functional L shoulder AROM in all planes and improved strength, only noting limitation with activities such as fastening her bra behind her back and picking up her dog. STG and LTGs #1 & 3 met with remaining LTGs partially met.  Nikky will benefit from continued skilled PT to promote improved FIR to allow for increased ease of donning/doffing her bra as well as further strengthening and scapular stabilization to allow for increased ease of picking up her dog, therefore will recommend recert for additional 4 weeks with frequency reduced to 1x/wk at pt request.  Percival Spanish, PT, MPT 11/07/20, 6:52 PM  [facility] [address]  [place], [place], [address] Phone: [phone]   Fax:  [phone]

## 2020-11-08 NOTE — Progress Notes (Signed)
Cardiology Office Note:    Date:  11/09/2020   ID:  Leslie Ward, DOB 1931/07/02, MRN [id number]  PCP:  [name], PA-C  Cardiologist:  Norman Herrlich, MD    Referring MD: [name], PA-C    ASSESSMENT:    1. Primary hypertension   2. Hyperlipidemia, unspecified hyperlipidemia type    PLAN:    In order of problems listed above:  1. She is improved BP at target no orthostatic shift and blood pressure and EKG shows no conduction system disease.  I will recheck renal function and her family request and also check her lipids although she has declined lipid-lowering therapy.  She will continue her multidrug regimen including amlodipine she has no edema beta-blocker and ARB.  I asked her to check her blood pressure at least 3 times a week for 2. Poorly controlled she has declined treatment other than facial and her family asked me to recheck and may benefit from PCSK9 inhibitor   Next appointment: 1 year   Medication Adjustments/Labs and Tests Ordered: Current medicines are reviewed at length with the patient today.  Concerns regarding medicines are outlined above.  Orders Placed This Encounter  Procedures  . Comprehensive metabolic panel  . Lipid panel  . EKG 12-Lead   No orders of the defined types were placed in this encounter.   Chief Complaint  Patient presents with  . Follow-up  . Hypertension    History of Present Illness:    Leslie Ward is a 87 y.o. female with a hx of hypertension hyperlipidemia and type 2 diabetes last seen 01/31/2020.  Unfortunately she is statin intolerant and has deferred treatment with other lipid-lowering medications. Compliance with diet, lifestyle and medications: Yes  She is improved spirits are better she has a very cute little dog that keeps her company went to physical therapy had steroid injection of the shoulder and no longer is in chronic pain.  Her son said in the past she has had episodes of vertigo at times she  has lightheadedness and we checked orthostatic signs that showed no shift in the office.  Her BP is optimized and I asked her to check blood pressure at home 3 times a week.  She has had no cardiovascular symptoms of palpitations syncope chest pain edema or shortness of breath Past Medical History:  Diagnosis Date  . Dystonia   . Hypertension   . Thyroid disease     Past Surgical History:  Procedure Laterality Date  . NO PAST SURGERIES      Current Medications: Current Meds  Medication Sig  . amLODipine (NORVASC) 5 MG tablet TAKE 1 TABLET(5 MG) BY MOUTH TWICE DAILY  . Calcium Carbonate (CALCIUM 600 PO) Take 2 tablets by mouth daily.  . Calcium Polycarbophil (FIBER-CAPS PO) Take 1 capsule by mouth 2 (two) times a day.   . Cinnamon 500 MG capsule Take 1,000 mg by mouth daily.  . fluticasone (FLONASE) 50 MCG/ACT nasal spray Place 2 sprays into both nostrils daily.  [name] gabapentin (NEURONTIN) 100 MG capsule Take 1 capsule (100 mg total) by mouth once for 1 dose.  . incobotulinumtoxinA (XEOMIN) 100 units SOLR injection Inject 100 Units into the muscle every 3 (three) months.  . levothyroxine (SYNTHROID) 50 MCG tablet TAKE 1 TABLET(50 MCG) BY MOUTH DAILY BEFORE AND BREAKFAST  . meclizine (ANTIVERT) 12.5 MG tablet Take 1 tablet (12.5 mg total) by mouth 3 (three) times daily as needed for dizziness.  [name] MELATONIN PO Take 2-3 tablets by mouth  at bedtime as needed (sleep).   . Menaquinone-7 (VITAMIN K2 PO) Take 1 tablet by mouth at bedtime.   . metoprolol tartrate (LOPRESSOR) 50 MG tablet Take 1 tablet (50 mg total) by mouth 2 (two) times daily.  . Multiple Vitamin (MULTIVITAMIN) tablet Take 1 tablet by mouth daily.  . Omega-3 Fatty Acids (OMEGA 3 PO) Take 2 tablets by mouth every evening.   [name] Phytosterol Esters (CHOLEST CARE PO) Take 1,600 mg by mouth daily.  . Probiotic Product (PROBIOTIC DAILY PO) Take 1 tablet by mouth daily.  . vitamin C (ASCORBIC ACID) 500 MG tablet Take 500 mg by mouth  every evening.   . [DISCONTINUED] telmisartan (MICARDIS) 20 MG tablet Take 1 tablet (20 mg total) by mouth daily.   Current Facility-Administered Medications for the 11/09/20 encounter (Office Visit) with Baldo Daub, MD  Medication  . incobotulinumtoxinA (XEOMIN) 100 units injection 100 Units  . incobotulinumtoxinA (XEOMIN) 100 units injection 100 Units     Allergies:   Crestor [rosuvastatin calcium]   Social History   Socioeconomic History  . Marital status: Widowed    Spouse name: Not on file  . Number of children: 1  . Years of education: 6 or 7 years of education  . Highest education level: Not on file  Occupational History  . Occupation: Retired  Tobacco Use  . Smoking status: Never Smoker  . Smokeless tobacco: Never Used  Vaping Use  . Vaping Use: Never used  Substance and Sexual Activity  . Alcohol use: No  . Drug use: No  . Sexual activity: Not on file  Other Topics Concern  . Not on file  Social History Narrative   Lives alone.   Right-handed.   No caffeine use.   Social Determinants of Health   Financial Resource Strain: Not on file  Food Insecurity: Not on file  Transportation Needs: Not on file  Physical Activity: Not on file  Stress: Not on file  Social Connections: Not on file     Family History: The patient's family history includes Heart Problems in her brother; Heart attack in her father; Stroke in her mother. ROS:   Please see the history of present illness.    All other systems reviewed and are negative.  EKGs/Labs/Other Studies Reviewed:    The following studies were reviewed today:  EKG:  EKG ordered today and personally reviewed.  The ekg ordered today demonstrates sinus rhythm and is normal  Recent Labs: 12/01/2019: B Natriuretic Peptide 76.7; Magnesium 2.1 03/22/2020: ALT 13; BUN 13; Creatinine, Ser 0.71; Hemoglobin 12.4; Platelets 255; Potassium 3.3; Sodium 136 03/29/2020: TSH 1.43  Recent Lipid Panel    Component Value  Date/Time   CHOL 250 (H) 12/01/2019 0344   CHOL 279 (H) 05/11/2019 1007   TRIG 85 12/01/2019 0344   HDL 62 12/01/2019 0344   HDL 64 05/11/2019 1007   CHOLHDL 4.0 12/01/2019 0344   VLDL 17 12/01/2019 0344   LDLCALC 171 (H) 12/01/2019 0344   LDLCALC 187 (H) 05/11/2019 1007    Physical Exam:    VS:  BP (!) 124/56   Pulse 71   Ht 4\' 9"  (1.448 m)   Wt 120 lb (54.4 kg)   SpO2 97%   BMI 25.97 kg/m     Wt Readings from Last 3 Encounters:  11/09/20 120 lb (54.4 kg)  08/25/20 120 lb (54.4 kg)  07/19/20 119 lb 8 oz (54.2 kg)    Repeat blood pressure by me 136/60 sitting 130/60 standing  GEN: She is smiling pleased with the quality of her life and improved after cortisone injection shoulder physical therapy well nourished, well developed in no acute distress HEENT: Normal NECK: No JVD; No carotid bruits LYMPHATICS: No lymphadenopathy CARDIAC: RRR, no murmurs, rubs, gallops RESPIRATORY:  Clear to auscultation without rales, wheezing or rhonchi  ABDOMEN: Soft, non-tender, non-distended MUSCULOSKELETAL:  No edema; No deformity  SKIN: Warm and dry NEUROLOGIC:  Alert and oriented x 3 PSYCHIATRIC:  Normal affect    Signed, Norman Herrlich, MD  11/09/2020 10:59 AM    [facility]

## 2020-11-09 ENCOUNTER — Other Ambulatory Visit: Payer: Self-pay

## 2020-11-09 ENCOUNTER — Encounter: Payer: Self-pay | Admitting: Cardiology

## 2020-11-09 ENCOUNTER — Ambulatory Visit (INDEPENDENT_AMBULATORY_CARE_PROVIDER_SITE_OTHER): Payer: Medicare HMO | Admitting: Cardiology

## 2020-11-09 ENCOUNTER — Encounter: Payer: Medicare HMO | Admitting: Physical Therapy

## 2020-11-09 VITALS — BP 124/56 | HR 71 | Ht <= 58 in | Wt 120.0 lb

## 2020-11-09 DIAGNOSIS — I10 Essential (primary) hypertension: Secondary | ICD-10-CM | POA: Diagnosis not present

## 2020-11-09 DIAGNOSIS — E785 Hyperlipidemia, unspecified: Secondary | ICD-10-CM | POA: Diagnosis not present

## 2020-11-09 MED ORDER — TELMISARTAN 20 MG PO TABS: 20.0000 mg | ORAL_TABLET | Freq: Every day | ORAL | 3 refills | Status: DC

## 2020-11-09 NOTE — Patient Instructions (Signed)

## 2020-11-09 NOTE — Telephone Encounter (Signed)
Refill for Telmisartan to pharmacy

## 2020-11-10 ENCOUNTER — Telehealth: Payer: Self-pay

## 2020-11-10 LAB — COMPREHENSIVE METABOLIC PANEL
ALT: 11 IU/L (ref 0–32)
AST: 17 IU/L (ref 0–40)
Albumin/Globulin Ratio: 1.8 (ref 1.2–2.2)
Albumin: 4.2 g/dL (ref 3.6–4.6)
Alkaline Phosphatase: 67 IU/L (ref 44–121)
BUN/Creatinine Ratio: 30 — ABNORMAL HIGH (ref 12–28)
BUN: 25 mg/dL (ref 8–27)
Bilirubin Total: 0.8 mg/dL (ref 0.0–1.2)
CO2: 23 mmol/L (ref 20–29)
Calcium: 9.3 mg/dL (ref 8.7–10.3)
Chloride: 105 mmol/L (ref 96–106)
Creatinine, Ser: 0.83 mg/dL (ref 0.57–1.00)
Globulin, Total: 2.4 g/dL (ref 1.5–4.5)
Glucose: 100 mg/dL — ABNORMAL HIGH (ref 65–99)
Potassium: 4.2 mmol/L (ref 3.5–5.2)
Sodium: 144 mmol/L (ref 134–144)
Total Protein: 6.6 g/dL (ref 6.0–8.5)
eGFR: 68 mL/min/{1.73_m2} (ref >59)

## 2020-11-10 LAB — LIPID PANEL
Chol/HDL Ratio: 4.1 ratio (ref 0.0–4.4)
Cholesterol, Total: 275 mg/dL — ABNORMAL HIGH (ref 100–199)
HDL: 67 mg/dL (ref >39)
LDL Chol Calc (NIH): 188 mg/dL — ABNORMAL HIGH (ref 0–99)
Triglycerides: 115 mg/dL (ref 0–149)
VLDL Cholesterol Cal: 20 mg/dL (ref 5–40)

## 2020-11-10 MED ORDER — PRAVASTATIN SODIUM 20 MG PO TABS: 20.0000 mg | ORAL_TABLET | ORAL | 3 refills | Status: DC

## 2020-11-10 NOTE — Telephone Encounter (Signed)
Spoke with patient regarding results and recommendation.  Patient verbalizes understanding and is agreeable to plan of care. Advised patient to call back with any issues or concerns.  

## 2020-11-10 NOTE — Telephone Encounter (Signed)
-----   Message from Baldo Daub, MD sent at 11/10/2020  7:58 AM EDT ----- Good results  Patient and her son requested that we recheck her lipids are severely elevated  Once again I would ask that she would accept a small amount of abdominal intensity statin pravastatin 20 mg 2 days a week.  If not would she try Repatha.  120 mg every 2 weeks  Previously she has declined to accept treatment for her lipids.  If the answer is yes please initiate recheck lipid profile 2 months

## 2020-11-14 ENCOUNTER — Other Ambulatory Visit: Payer: Self-pay

## 2020-11-14 ENCOUNTER — Ambulatory Visit: Payer: Medicare HMO | Attending: Orthopaedic Surgery | Admitting: Physical Therapy

## 2020-11-14 ENCOUNTER — Encounter: Payer: Self-pay | Admitting: Physical Therapy

## 2020-11-14 DIAGNOSIS — M6281 Muscle weakness (generalized): Secondary | ICD-10-CM

## 2020-11-14 DIAGNOSIS — R293 Abnormal posture: Secondary | ICD-10-CM | POA: Diagnosis not present

## 2020-11-14 DIAGNOSIS — G8929 Other chronic pain: Secondary | ICD-10-CM | POA: Diagnosis not present

## 2020-11-14 DIAGNOSIS — M25512 Pain in left shoulder: Secondary | ICD-10-CM | POA: Insufficient documentation

## 2020-11-14 DIAGNOSIS — M25612 Stiffness of left shoulder, not elsewhere classified: Secondary | ICD-10-CM | POA: Diagnosis not present

## 2020-11-14 NOTE — Patient Instructions (Signed)
  Access Code: [email or web address] URL: [email or web address] Date: 11/14/2020 Prepared by: Glenetta Hew  Exercises Standing Shoulder Internal Rotation Stretch with Dowel - 2 x daily - 7 x weekly - 1 sets - 2-3 reps - 30seconds hold

## 2020-11-14 NOTE — Therapy (Signed)
[facility] [address]  [place], [place], [address] Phone: [phone]   Fax:  [phone]  Physical Therapy Treatment  Patient Details  Name: Leslie Ward MRN: [id number] Date of Birth: 26-Feb-1931 Referring Provider (PT): Ophelia Charter, MD   Encounter Date: 11/14/2020   PT End of Session - 11/14/20 1723    Visit Number 11    Number of Visits 14    Date for PT Re-Evaluation 12/05/20    Authorization Type [employer] Medicare & Medicaid    PT Start Time 1635    PT Stop Time 1723    PT Time Calculation (min) 48 min    Activity Tolerance Patient tolerated treatment well    Behavior During Therapy [name] for tasks assessed/performed           Past Medical History:  Diagnosis Date  . Dystonia   . Hypertension   . Thyroid disease     Past Surgical History:  Procedure Laterality Date  . NO PAST SURGERIES      There were no vitals filed for this visit.   Subjective Assessment - 11/14/20 1643    Subjective Pt reports she had L shoulder pain yesterday and not sure what happened but it does not hurt today. Does report the exercises are going well.    Pertinent History vertigo, thyroid disease, HTN, DM, cervical dystonia, bladder stimulator    Diagnostic tests L shoulder MRI 09/09/20: 1. Partial thickness articular surface tear of the posterior supraspinatus tendon. Moderate supraspinatus and infraspinatus tendinopathy with mild distal subscapularis tendinopathy.  2. Mild subacromial subdeltoid bursitis.  3. Mild tendinopathy of the intra-articular segment of the long head of the biceps.  4. Linear accentuated signal traversing the posterosuperior labrum  on images 5-6 of series 3 suspicious for a small labral tear although not well corroborated on other imaging planes.  5. Mild degenerative AC joint and glenohumeral joint arthropathy.  6. Despite efforts by the technologist and patient, motion artifact  is present on today's  exam and could not be eliminated. This reduces  exam sensitivity and specificity.    Patient Stated Goals "to feel better"    Currently in Pain? No/denies                             [facility] Adult PT Treatment/Exercise - 11/14/20 1635      Exercises   Exercises Shoulder      Shoulder Exercises: Supine   Flexion AROM;10 reps;Strengthening    ABduction AROM;Strengthening;20 reps    ABduction Limitations Supine in scaption plane      Shoulder Exercises: Standing   External Rotation Strengthening;Left;Theraband;10 reps    Theraband Level (Shoulder External Rotation) Level 2 (Red)    External Rotation Limitations towel in under L elbow; Iso walk out    Internal Rotation Strengthening;Left;10 reps;Theraband    Theraband Level (Shoulder Internal Rotation) Level 2 (Red)    Internal Rotation Limitations towel under L elbow; iso walk out    Flexion Strengthening;Both;10 reps;Weights    Shoulder Flexion Weight (lbs) 1x10 w 1 pound    Flexion Limitations Limited by pain    Row AROM;Strengthening;Both;Theraband    Theraband Level (Shoulder Row) Level 2 (Red)    Row Limitations Standing; 1 x 10 with elbows tucked; 1 x 10 with shoulders at 90 and elbows 90 for horizinal abduction; 1 x 10 low row for some shoulder ext   Cues at  all stages for scapular retractions   Other Standing Exercises L IR stretch with Dowel 3 x 30 seconds      Shoulder Exercises: ROM/Strengthening   Nustep L4x62mn                  PT Education - 11/14/20 1729    Education Details HEP update - Code [email or web address]            PT Short Term Goals - 10/12/20 0905      PT SHORT TERM GOAL #1   Title Patient will be independent with initial HEP    Status Achieved   10/10/20            PT Long Term Goals - 11/07/20 1744      PT LONG TERM GOAL #1   Title Patient will be independent with ongoing/advanced HEP for self-management at home    Status Achieved      PT LONG TERM GOAL #2   Title  Patient to improve L shoulder AROM to essentitally equivalent to R shoulder without pain provocation    Status Partially Met   11/08/27 - Pt still notes limitation with FIR behind back   Target Date 12/05/20      PT LONG TERM GOAL #3   Title Patient will demonstrate improved L shoulder strength to >/= 4 to 4+/5 for functional UE use    Status Achieved   10/26/20     PT LONG TERM GOAL #4   Title Patient to report ability to perform ADLs and household tasks without limitation due to L shoulder pain, LOM or weakness    Status Partially Met   11/07/20: Pt notes difficulty with clasping her bra behind her back   Target Date 12/05/20      PT LONG TERM GOAL #5   Title Patient will report ability to pick up her dog w/o limitation due to L shoulder    Status Partially Met    Target Date 12/05/20                 Plan - 11/14/20 1725    Clinical Impression Statement [name] she felt pain during the last two days after some heavier lifting than she was used to. She did not have pain at rest today but did feel some pain during AROM strengthening for L shoulder Flex and ABD. She was able to complete these in a supine position. She required cuing to correct form for most exercises including rows with red thera band and L shoulder AROM. Pt will continue to benefit from skilled PT to increase L shoulder ROM and strength and decrease pain to improve functional ability for activities such as putting on a bra.    Personal Factors and Comorbidities Age;Comorbidity 3+;Fitness;Past/Current Experience;Time since onset of injury/illness/exacerbation    Comorbidities vertigo, thyroid disease, HTN, DM, cervical dystonia, bladder stimulator    Examination-Activity Limitations Dressing;Lift    Rehab Potential Good    PT Frequency 1x / week    PT Duration 4 weeks    PT Treatment/Interventions ADLs/Self Care Home Management;Cryotherapy;Iontophoresis 442mml Dexamethasone;Moist Heat;Functional mobility  training;Therapeutic activities;Therapeutic exercise;Neuromuscular re-education;Patient/family education;Manual techniques;Passive range of motion;Dry needling;Taping;Vasopneumatic Device    PT Next Visit Plan Progress HEP to include RC strengthening HEP, focus on behind back motion/IR, postural strengthening/stabilization, L shoulder ROM and strengthening.    PT Home Exercise Plan MedBridge Access Code: [email or web address]); [email or web address]); [email or web address] (3/17); [email or web address] ( 11/14/2020)    Consulted and Agree with Plan of  Care Patient           Patient will benefit from skilled therapeutic intervention in order to improve the following deficits and impairments:  Decreased activity tolerance,Decreased knowledge of precautions,Decreased range of motion,Decreased strength,Increased fascial restricitons,Increased muscle spasms,Impaired perceived functional ability,Impaired flexibility,Impaired UE functional use,Improper body mechanics,Postural dysfunction,Pain  Visit Diagnosis: Chronic left shoulder pain  Stiffness of left shoulder, not elsewhere classified  Muscle weakness (generalized)  Abnormal posture     Problem List Patient Active Problem List   Diagnosis Date Noted  . Thyroid disease   . Ganglion cyst 08/25/2020  . Mixed incontinence 03/15/2020  . Urgency incontinence 03/15/2020  . Vertigo 12/01/2019  . Cervical dystonia 03/08/2019  . Hyperlipidemia 02/11/2019  . Edema 02/11/2019  . Hypertension 01/19/2019  . Type 2 diabetes mellitus ([place]) 01/19/2019  . Hypothyroidism (acquired) 01/19/2019  . Neck pain 01/19/2019  . Dystonia 01/19/2019  . Adhesive capsulitis of left shoulder 12/22/2018    Newman Nickels SPT 11/14/2020, 5:38 PM  [facility] [address]  [address] [place], [place], [address] Phone: [phone]   Fax:  [phone]  Name: Raeven Pint MRN: [id number] Date of Birth: [birth year]

## 2020-11-17 ENCOUNTER — Encounter: Payer: Self-pay | Admitting: Medical

## 2020-11-20 ENCOUNTER — Telehealth: Payer: Self-pay | Admitting: Medical

## 2020-11-20 MED ORDER — GABAPENTIN 100 MG PO CAPS: 100.0000 mg | ORAL_CAPSULE | Freq: Every day | ORAL | 2 refills | Status: DC

## 2020-11-20 MED ORDER — MECLIZINE HCL 12.5 MG PO TABS: 12.5000 mg | ORAL_TABLET | Freq: Three times a day (TID) | ORAL | 0 refills | Status: DC | PRN

## 2020-11-20 NOTE — Telephone Encounter (Signed)
Medication area states " expired " last filled 09/2020

## 2020-11-20 NOTE — Telephone Encounter (Signed)
Rx refill gabapentin sent to pt pharmacy.

## 2020-11-21 ENCOUNTER — Other Ambulatory Visit: Payer: Self-pay

## 2020-11-21 ENCOUNTER — Encounter: Payer: Self-pay | Admitting: Physical Therapy

## 2020-11-21 ENCOUNTER — Ambulatory Visit: Payer: Medicare HMO | Admitting: Physical Therapy

## 2020-11-21 DIAGNOSIS — M25612 Stiffness of left shoulder, not elsewhere classified: Secondary | ICD-10-CM | POA: Diagnosis not present

## 2020-11-21 DIAGNOSIS — G8929 Other chronic pain: Secondary | ICD-10-CM

## 2020-11-21 DIAGNOSIS — R293 Abnormal posture: Secondary | ICD-10-CM | POA: Diagnosis not present

## 2020-11-21 DIAGNOSIS — M25512 Pain in left shoulder: Secondary | ICD-10-CM | POA: Diagnosis not present

## 2020-11-21 DIAGNOSIS — M6281 Muscle weakness (generalized): Secondary | ICD-10-CM

## 2020-11-21 NOTE — Therapy (Signed)
[facility] [address]  [place], [place], [address] Phone: [phone]   Fax:  [phone]  Physical Therapy Treatment  Patient Details  Name: Leslie Ward MRN: [id number] Date of Birth: 01-06-1931 Referring Provider (PT): Ophelia Charter, MD   Encounter Date: 11/21/2020   PT End of Session - 11/21/20 1628    Visit Number 12    Number of Visits 14    Date for PT Re-Evaluation 12/05/20    Authorization Type [employer] Medicare & Medicaid    PT Start Time 1628   Pt arrived late   PT Stop Time 1701    PT Time Calculation (min) 33 min    Activity Tolerance Patient tolerated treatment well    Behavior During Therapy [facility] for tasks assessed/performed           Past Medical History:  Diagnosis Date  . Dystonia   . Hypertension   . Thyroid disease     Past Surgical History:  Procedure Laterality Date  . NO PAST SURGERIES      There were no vitals filed for this visit.   Subjective Assessment - 11/21/20 1631    Subjective Pt reports she is typically feeling more pain in her L upper arm than in her shoulder recently - triggered by activities such as trying to lift her shirt over her head.    Pertinent History vertigo, thyroid disease, HTN, DM, cervical dystonia, bladder stimulator    Diagnostic tests L shoulder MRI 09/09/20: 1. Partial thickness articular surface tear of the posterior supraspinatus tendon. Moderate supraspinatus and infraspinatus tendinopathy with mild distal subscapularis tendinopathy.  2. Mild subacromial subdeltoid bursitis.  3. Mild tendinopathy of the intra-articular segment of the long head of the biceps.  4. Linear accentuated signal traversing the posterosuperior labrum  on images 5-6 of series 3 suspicious for a small labral tear although not well corroborated on other imaging planes.  5. Mild degenerative AC joint and glenohumeral joint arthropathy.  6. Despite efforts by the technologist and  patient, motion artifact  is present on today's exam and could not be eliminated. This reduces  exam sensitivity and specificity.    Patient Stated Goals "to feel better"    Currently in Pain? No/denies                             [facility] Adult PT Treatment/Exercise - 11/21/20 1628      Exercises   Exercises Shoulder      Shoulder Exercises: Standing   Horizontal ABduction Both;10 reps;Strengthening;Theraband    Theraband Level (Shoulder Horizontal ABduction) Level 1 (Yellow)    Horizontal ABduction Limitations standing with spine resting on pool noodle on wall to promote increased scap retraction    External Rotation Both;10 reps;Strengthening;Theraband   2 sets   Theraband Level (Shoulder External Rotation) Level 1 (Yellow)    External Rotation Limitations standing with spine resting on pool noodle on wall to promote increased scap retraction    Extension Both;15 reps;Strengthening;Theraband    Theraband Level (Shoulder Extension) Level 2 (Red)    Extension Limitations VC & TC for scap retraction    Row Both;15 reps;Strengthening;Theraband    Theraband Level (Shoulder Row) Level 2 (Red)    Row Limitations VC & TC for scap retraction    Diagonals Both;10 reps;Strengthening;Theraband    Theraband Level (Shoulder Diagonals) Level 1 (Yellow)    Diagonals Limitations standing with spine resting on  pool noodle on wall to promote increased scap retraction    Other Standing Exercises L IR stretch with Dowel vertical behind back 3 x 30 seconds      Shoulder Exercises: ROM/Strengthening   UBE (Upper Arm Bike) L1.0 x 5 min (3' fwd/2' back)   limited by fatigue   Ball on Wall L shoulder CW/CCW circles at 90 flexion & scaption 2 x 10 each way      Shoulder Exercises: Stretch   Cross Chest Stretch 2 reps;30 seconds    Cross Chest Stretch Limitations L posterior capsule stretch                    PT Short Term Goals - 10/12/20 0905      PT SHORT TERM GOAL #1    Title Patient will be independent with initial HEP    Status Achieved   10/10/20            PT Long Term Goals - 11/07/20 1744      PT LONG TERM GOAL #1   Title Patient will be independent with ongoing/advanced HEP for self-management at home    Status Achieved      PT LONG TERM GOAL #2   Title Patient to improve L shoulder AROM to essentitally equivalent to R shoulder without pain provocation    Status Partially Met   11/08/27 - Pt still notes limitation with FIR behind back   Target Date 12/05/20      PT LONG TERM GOAL #3   Title Patient will demonstrate improved L shoulder strength to >/= 4 to 4+/5 for functional UE use    Status Achieved   10/26/20     PT LONG TERM GOAL #4   Title Patient to report ability to perform ADLs and household tasks without limitation due to L shoulder pain, LOM or weakness    Status Partially Met   11/07/20: Pt notes difficulty with clasping her bra behind her back   Target Date 12/05/20      PT LONG TERM GOAL #5   Title Patient will report ability to pick up her dog w/o limitation due to L shoulder    Status Partially Met    Target Date 12/05/20                 Plan - 11/21/20 1634    Clinical Impression Statement [name] reports her recent pain has been more in the L upper arm than her shoulder and is typically triggered by activities such as pulling her shirt over her head but no longer notes pain with picking up her dog. FIR has been improving since addition of dowel shoulder IR AAROM. Therapeutic exercises targeting posterior capsule stretching along with scapular and RTC strengthening - pt noting fatigue after most exercises but no significant pain reported. Cues needed to increase scapular activation during rows and extension/retraction but noting these exercises make her shoulder feel better. Will consider HEP update next visit as indicated for progression of RTC and scapular strengthening.    Comorbidities vertigo, thyroid disease, HTN, DM,  cervical dystonia, bladder stimulator    Examination-Activity Limitations Lift;Dressing    Rehab Potential Good    PT Frequency 1x / week    PT Duration 4 weeks    PT Treatment/Interventions ADLs/Self Care Home Management;Cryotherapy;Iontophoresis 3m/ml Dexamethasone;Moist Heat;Functional mobility training;Therapeutic activities;Therapeutic exercise;Neuromuscular re-education;Patient/family education;Manual techniques;Passive range of motion;Dry needling;Taping;Vasopneumatic Device    PT Next Visit Plan Progress HEP to include RC strengthening HEP, focus  on behind back motion/IR, postural strengthening/stabilization, L shoulder ROM and strengthening.    PT Home Exercise Plan MedBridge Access Code: [email or web address] (2/15); [email or web address] (3/1); [email or web address] (3/17); [email or web address] (4/5)    Consulted and Agree with Plan of Care Patient           Patient will benefit from skilled therapeutic intervention in order to improve the following deficits and impairments:  Decreased activity tolerance,Decreased knowledge of precautions,Decreased range of motion,Decreased strength,Increased fascial restricitons,Increased muscle spasms,Impaired perceived functional ability,Impaired flexibility,Impaired UE functional use,Improper body mechanics,Postural dysfunction,Pain  Visit Diagnosis: Chronic left shoulder pain  Stiffness of left shoulder, not elsewhere classified  Muscle weakness (generalized)  Abnormal posture     Problem List Patient Active Problem List   Diagnosis Date Noted  . Thyroid disease   . Ganglion cyst 08/25/2020  . Mixed incontinence 03/15/2020  . Urgency incontinence 03/15/2020  . Vertigo 12/01/2019  . Cervical dystonia 03/08/2019  . Hyperlipidemia 02/11/2019  . Edema 02/11/2019  . Hypertension 01/19/2019  . Type 2 diabetes mellitus ([place]) 01/19/2019  . Hypothyroidism (acquired) 01/19/2019  . Neck pain 01/19/2019  . Dystonia 01/19/2019  . Adhesive capsulitis of left shoulder 12/22/2018     Percival Spanish, PT, MPT  11/21/2020, 7:02 PM  [facility] [address]  [address] [place], [place], [address] Phone: [phone]   Fax:  [phone]  Name: Laquenta Whitsell MRN: [id number] Date of Birth: [birth year]

## 2020-11-27 ENCOUNTER — Ambulatory Visit: Payer: Medicare HMO

## 2020-11-29 ENCOUNTER — Other Ambulatory Visit: Payer: Self-pay

## 2020-11-29 ENCOUNTER — Ambulatory Visit: Payer: Medicare HMO

## 2020-11-29 DIAGNOSIS — M25612 Stiffness of left shoulder, not elsewhere classified: Secondary | ICD-10-CM

## 2020-11-29 DIAGNOSIS — R293 Abnormal posture: Secondary | ICD-10-CM

## 2020-11-29 DIAGNOSIS — M6281 Muscle weakness (generalized): Secondary | ICD-10-CM | POA: Diagnosis not present

## 2020-11-29 DIAGNOSIS — G8929 Other chronic pain: Secondary | ICD-10-CM | POA: Diagnosis not present

## 2020-11-29 DIAGNOSIS — M25512 Pain in left shoulder: Secondary | ICD-10-CM | POA: Diagnosis not present

## 2020-11-29 NOTE — Therapy (Signed)
[facility] [address]  [place], [place], [address] Phone: [phone]   Fax:  [phone]  Physical Therapy Treatment  Patient Details  Name: Leslie Ward MRN: [id number] Date of Birth: July 06, 1931 Referring Provider (PT): Ophelia Charter, MD   Encounter Date: 11/29/2020   PT End of Session - 11/29/20 1105    Visit Number 13    Number of Visits 14    Date for PT Re-Evaluation 12/05/20    Authorization Type [employer]etna Medicare & Medicaid    PT Start Time 1020    PT Stop Time 1101    PT Time Calculation (min) 41 min    Activity Tolerance Patient tolerated treatment well    Behavior During Therapy [facility] for tasks assessed/performed           Past Medical History:  Diagnosis Date  . Dystonia   . Hypertension   . Thyroid disease     Past Surgical History:  Procedure Laterality Date  . NO PAST SURGERIES      There were no vitals filed for this visit.   Subjective Assessment - 11/29/20 1023    Subjective Pt reports continued difficulty removing her bra and putting on her shirts.    Pertinent History vertigo, thyroid disease, HTN, DM, cervical dystonia, bladder stimulator    Diagnostic tests L shoulder MRI 09/09/20: 1. Partial thickness articular surface tear of the posterior supraspinatus tendon. Moderate supraspinatus and infraspinatus tendinopathy with mild distal subscapularis tendinopathy.  2. Mild subacromial subdeltoid bursitis.  3. Mild tendinopathy of the intra-articular segment of the long head of the biceps.  4. Linear accentuated signal traversing the posterosuperior labrum  on images 5-6 of series 3 suspicious for a small labral tear although not well corroborated on other imaging planes.  5. Mild degenerative AC joint and glenohumeral joint arthropathy.  6. Despite efforts by the technologist and patient, motion artifact  is present on today's exam and could not be eliminated. This reduces  exam sensitivity  and specificity.    Patient Stated Goals "to feel better"    Currently in Pain? No/denies              [facility] PT Assessment - 11/29/20 0001      AROM   Left Shoulder Internal Rotation --   FIR to T6                        [facility] Adult PT Treatment/Exercise - 11/29/20 0001      Exercises   Exercises Shoulder      Shoulder Exercises: Supine   External Rotation AAROM;Left;10 reps    External Rotation Limitations 3" holds, therapist assist    Internal Rotation AAROM;Left;10 reps    Internal Rotation Limitations 3" holds, therapist assist      Shoulder Exercises: Standing   Internal Rotation AAROM;Left;20 reps    Internal Rotation Limitations towel and cane behind back   10 reps with wand B AAROM behind back   Extension Strengthening;Both;20 reps;Theraband    Theraband Level (Shoulder Extension) Level 3 (Green)    Row Strengthening;Both;20 reps;Theraband    Theraband Level (Shoulder Row) Level 3 (Green)      Shoulder Exercises: ROM/Strengthening   Nustep L5x54mn      Manual Therapy   Manual Therapy Passive ROM    Passive ROM ER/IR stretch into end range with prolonged holds  PT Education - 11/29/20 1122    Education Details HEP update: Access Code: [email or web address]    Person(s) Educated Patient    Methods Explanation;Demonstration;Tactile cues;Verbal cues;Handout    Comprehension Verbalized understanding;Returned demonstration;Verbal cues required;Tactile cues required;Need further instruction            PT Short Term Goals - 10/12/20 0905      PT SHORT TERM GOAL #1   Title Patient will be independent with initial HEP    Status Achieved   10/10/20            PT Long Term Goals - 11/07/20 1744      PT LONG TERM GOAL #1   Title Patient will be independent with ongoing/advanced HEP for self-management at home    Status Achieved      PT LONG TERM GOAL #2   Title Patient to improve L shoulder AROM to essentitally equivalent to R  shoulder without pain provocation    Status Partially Met   11/08/27 - Pt still notes limitation with FIR behind back   Target Date 12/05/20      PT LONG TERM GOAL #3   Title Patient will demonstrate improved L shoulder strength to >/= 4 to 4+/5 for functional UE use    Status Achieved   10/26/20     PT LONG TERM GOAL #4   Title Patient to report ability to perform ADLs and household tasks without limitation due to L shoulder pain, LOM or weakness    Status Partially Met   11/07/20: Pt notes difficulty with clasping her bra behind her back   Target Date 12/05/20      PT LONG TERM GOAL #5   Title Patient will report ability to pick up her dog w/o limitation due to L shoulder    Status Partially Met    Target Date 12/05/20                 Plan - 11/29/20 1126    Clinical Impression Statement Pt reports continued difficulty with reaching to don/doff bra and pulling her shirt over her head d/t ROM limitations in IR. Focused mostly on AAROM IR to encourage an increase in ROM. Also did PROM with some stretching into end range with ER/IR, pt demonstrated good response with increased motion and no pain. Progressed her scap stab exercises with G Tband today, with her still needing some cues to retract her shoulder blades. She plans to meet with doctor to speak about her progress and discuss further options.    Personal Factors and Comorbidities Age;Comorbidity 3+;Fitness;Past/Current Experience;Time since onset of injury/illness/exacerbation    Comorbidities vertigo, thyroid disease, HTN, DM, cervical dystonia, bladder stimulator    PT Frequency 1x / week    PT Duration 4 weeks    PT Treatment/Interventions ADLs/Self Care Home Management;Cryotherapy;Iontophoresis 28m/ml Dexamethasone;Moist Heat;Functional mobility training;Therapeutic activities;Therapeutic exercise;Neuromuscular re-education;Patient/family education;Manual techniques;Passive range of motion;Dry needling;Taping;Vasopneumatic  Device    PT Next Visit Plan Progress HEP to include RC strengthening HEP, focus on behind back motion/IR, postural strengthening/stabilization, L shoulder ROM and strengthening.    PT Home Exercise Plan MedBridge Access Code: [email or web address](2/15); [email or web address](3/1); [email or web address] (3/17); [email or web address] (4/5), (4/20) [email or web address]   Consulted and Agree with Plan of Care Patient           Patient will benefit from skilled therapeutic intervention in order to improve the following deficits and impairments:  Decreased activity tolerance,Decreased knowledge of precautions,Decreased range of motion,Decreased strength,Increased fascial restricitons,Increased muscle spasms,Impaired perceived functional  ability,Impaired flexibility,Impaired UE functional use,Improper body mechanics,Postural dysfunction,Pain  Visit Diagnosis: Chronic left shoulder pain  Stiffness of left shoulder, not elsewhere classified  Muscle weakness (generalized)  Abnormal posture     Problem List Patient Active Problem List   Diagnosis Date Noted  . Thyroid disease   . Ganglion cyst 08/25/2020  . Mixed incontinence 03/15/2020  . Urgency incontinence 03/15/2020  . Vertigo 12/01/2019  . Cervical dystonia 03/08/2019  . Hyperlipidemia 02/11/2019  . Edema 02/11/2019  . Hypertension 01/19/2019  . Type 2 diabetes mellitus ([place]) 01/19/2019  . Hypothyroidism (acquired) 01/19/2019  . Neck pain 01/19/2019  . Dystonia 01/19/2019  . Adhesive capsulitis of left shoulder 12/22/2018    Artist Pais, PTA 11/29/2020, 11:47 AM  [facility] [address]  [place], [place], [address] Phone: [phone]   Fax:  [phone]  Name: Leslie Ward MRN: [id number] Date of Birth: 06/06/1931

## 2020-12-04 ENCOUNTER — Ambulatory Visit: Payer: Medicare HMO

## 2020-12-04 ENCOUNTER — Other Ambulatory Visit: Payer: Self-pay

## 2020-12-04 DIAGNOSIS — G8929 Other chronic pain: Secondary | ICD-10-CM | POA: Diagnosis not present

## 2020-12-04 DIAGNOSIS — R293 Abnormal posture: Secondary | ICD-10-CM | POA: Diagnosis not present

## 2020-12-04 DIAGNOSIS — M6281 Muscle weakness (generalized): Secondary | ICD-10-CM | POA: Diagnosis not present

## 2020-12-04 DIAGNOSIS — M25612 Stiffness of left shoulder, not elsewhere classified: Secondary | ICD-10-CM

## 2020-12-04 DIAGNOSIS — M25512 Pain in left shoulder: Secondary | ICD-10-CM

## 2020-12-04 NOTE — Therapy (Addendum)
[facility] [place], [place], [address] Phone: [phone]   Fax:  [phone]  Physical Therapy Treatment / Discharge Summary  Patient Details  Name: Leslie Ward MRN: [id number] Date of Birth: Apr 29, 1931 Referring Provider (PT): Ophelia Charter, MD   Encounter Date: 12/04/2020   PT End of Session - 12/04/20 1751     Visit Number 14    Number of Visits 14    Date for PT Re-Evaluation 12/05/20    Authorization Type [employer] & Medicaid    PT Start Time 4967   pt late   PT Stop Time 1745    PT Time Calculation (min) 35 min    Activity Tolerance Patient tolerated treatment well    Behavior During Therapy [facility] for tasks assessed/performed             Past Medical History:  Diagnosis Date   Dystonia    Hypertension    Thyroid disease     Past Surgical History:  Procedure Laterality Date   NO PAST SURGERIES      There were no vitals filed for this visit.   Subjective Assessment - 12/04/20 1712     Subjective Pt reports still having difficulty with dressing her shirts and reaching for her bra strap.    Pertinent History vertigo, thyroid disease, HTN, DM, cervical dystonia, bladder stimulator    Diagnostic tests L shoulder MRI 09/09/20: 1. Partial thickness articular surface tear of the posterior supraspinatus tendon. Moderate supraspinatus and infraspinatus tendinopathy with mild distal subscapularis tendinopathy.  2. Mild subacromial subdeltoid bursitis.  3. Mild tendinopathy of the intra-articular segment of the long head of the biceps.  4. Linear accentuated signal traversing the posterosuperior labrum  on images 5-6 of series 3 suspicious for a small labral tear although not well corroborated on other imaging planes.  5. Mild degenerative AC joint and glenohumeral joint arthropathy.  6. Despite efforts by the technologist and patient, motion artifact  is present on today's exam and could  not be eliminated. This reduces  exam sensitivity and specificity.    Patient Stated Goals "to feel better"    Currently in Pain? No/denies                [facility] PT Assessment - 12/04/20 0001       Assessment   Medical Diagnosis L shoulder OA    Referring Provider (PT) Ophelia Charter, MD    Onset Date/Surgical Date --   4 years     Observation/Other Assessments   Focus on Therapeutic Outcomes (FOTO)  Shoulder: FS=57, predicted discharge FS=61      AROM   Left Shoulder Flexion 134 Degrees    Left Shoulder ABduction 152 Degrees    Left Shoulder Internal Rotation 70 Degrees   right at bra strap                                    PT Short Term Goals - 10/12/20 0905       PT SHORT TERM GOAL #1   Title Patient will be independent with initial HEP    Status Achieved   10/10/20              PT Long Term Goals - 12/04/20 1730       PT LONG TERM GOAL #1   Title Patient will be independent with ongoing/advanced HEP  for self-management at home    Status Achieved      PT LONG TERM GOAL #2   Title Patient to improve L shoulder AROM to essentitally equivalent to R shoulder without pain provocation    Status Partially Met   11/08/27 - Pt still notes limitation with FIR behind back; mild pain with flex and abd end range     PT LONG TERM GOAL #3   Title Patient will demonstrate improved L shoulder strength to >/= 4 to 4+/5 for functional UE use    Status Achieved   10/26/20     PT LONG TERM GOAL #4   Title Patient to report ability to perform ADLs and household tasks without limitation due to L shoulder pain, LOM or weakness    Status Partially Met   pt notes being able to complete ADLs and household task but mild pain when reaching OH, reaching for bra strap, and pulling shirts off     PT LONG TERM GOAL #5   Title Patient will report ability to pick up her dog w/o limitation due to L shoulder    Status Partially Met                   Plan  - 12/04/20 1800     Clinical Impression Statement Pt reports continued difficulty with reaching behind her back to put her bra on and when pulling her shirt off w/o being limited by pain in ant shoulder. All shoulder motions were functional but she reported some mild ant shld pain with the end ranges. FIR showed her being able to reach to her bra strap but still noting some pain with this motion. She is able to pick up her dog with her L arm but still reporting some weakness d/t her dog being heavy. She has made progress toward all remaining goals. She will be continuing with her HEP w/o PT as she is going to be discharged. Advised her to F/U with doctor to discuss progress and for further options for her shoulder.    Personal Factors and Comorbidities Age;Comorbidity 3+;Fitness;Past/Current Experience;Time since onset of injury/illness/exacerbation    Comorbidities vertigo, thyroid disease, HTN, DM, cervical dystonia, bladder stimulator    PT Frequency 1x / week    PT Duration 4 weeks    PT Treatment/Interventions ADLs/Self Care Home Management;Cryotherapy;Iontophoresis 94m/ml Dexamethasone;Moist Heat;Functional mobility training;Therapeutic activities;Therapeutic exercise;Neuromuscular re-education;Patient/family education;Manual techniques;Passive range of motion;Dry needling;Taping;Vasopneumatic Device    PT Next Visit Plan Progress HEP to include RC strengthening HEP, focus on behind back motion/IR, postural strengthening/stabilization, L shoulder ROM and strengthening.    PT Home Exercise Plan Med[employer] Access Code: [email or web address](2/15); [email or web address](3/1); [email or web address] (3/17); [email or web address] (4/5), (4/20) [email or web address]   Consulted and Agree with Plan of Care Patient             Patient will benefit from skilled therapeutic intervention in order to improve the following deficits and impairments:  Decreased activity tolerance,Decreased knowledge of precautions,Decreased range of motion,Decreased strength,Increased  fascial restricitons,Increased muscle spasms,Impaired perceived functional ability,Impaired flexibility,Impaired UE functional use,Improper body mechanics,Postural dysfunction,Pain  Visit Diagnosis: Chronic left shoulder pain  Stiffness of left shoulder, not elsewhere classified  Muscle weakness (generalized)  Abnormal posture     Problem List Patient Active Problem List   Diagnosis Date Noted   Thyroid disease    Ganglion cyst 08/25/2020   Mixed incontinence 03/15/2020   Urgency incontinence 03/15/2020   Vertigo 12/01/2019   Cervical dystonia 03/08/2019   Hyperlipidemia 02/11/2019  Edema 02/11/2019   Hypertension 01/19/2019   Type 2 diabetes mellitus ([place]) 01/19/2019   Hypothyroidism (acquired) 01/19/2019   Neck pain 01/19/2019   Dystonia 01/19/2019   Adhesive capsulitis of left shoulder 12/22/2018    Artist Pais, PTA 12/04/2020, 6:21 PM  [facility] [address]  [place], [place], [address] Phone: [phone]   Fax:  [phone]  Name: Railyn House MRN: [id number] Date of Birth: Apr 15, 1931   PHYSICAL THERAPY DISCHARGE SUMMARY  Visits from Start of Care: 14  Current functional level related to goals / functional outcomes:   Refer to above clinical impression.   Remaining deficits:   As above.   Education / Equipment:   HEP   Patient agrees to discharge. Patient goals were partially met. Patient is being discharged due to being pleased with the current functional level.  Percival Spanish, PT, MPT 07/16/21, 2:41 PM  [facility] [address]  [place], [place], [address] Phone: [phone]   Fax:  [phone]

## 2020-12-06 ENCOUNTER — Encounter: Payer: Self-pay | Admitting: Medical

## 2020-12-06 MED ORDER — METOPROLOL TARTRATE 50 MG PO TABS: 50.0000 mg | ORAL_TABLET | Freq: Two times a day (BID) | ORAL | 1 refills | Status: DC

## 2020-12-07 ENCOUNTER — Ambulatory Visit: Payer: Medicare HMO | Admitting: Physical Therapy

## 2021-01-02 ENCOUNTER — Other Ambulatory Visit: Payer: Self-pay

## 2021-01-02 MED ORDER — AMLODIPINE BESYLATE 5 MG PO TABS: ORAL_TABLET | ORAL | 2 refills | Status: DC

## 2021-01-02 NOTE — Telephone Encounter (Signed)
Refill of Amlodipine 5 mg sent to [facility] on [address] per patient request.

## 2021-01-03 DIAGNOSIS — H8111 Benign paroxysmal vertigo, right ear: Secondary | ICD-10-CM | POA: Insufficient documentation

## 2021-01-03 DIAGNOSIS — M542 Cervicalgia: Secondary | ICD-10-CM | POA: Diagnosis not present

## 2021-01-03 HISTORY — DX: Benign paroxysmal vertigo, right ear: H81.11

## 2021-01-04 DIAGNOSIS — H8111 Benign paroxysmal vertigo, right ear: Secondary | ICD-10-CM | POA: Diagnosis not present

## 2021-01-04 DIAGNOSIS — M542 Cervicalgia: Secondary | ICD-10-CM | POA: Diagnosis not present

## 2021-01-16 DIAGNOSIS — H02831 Dermatochalasis of right upper eyelid: Secondary | ICD-10-CM | POA: Diagnosis not present

## 2021-01-16 DIAGNOSIS — H43393 Other vitreous opacities, bilateral: Secondary | ICD-10-CM | POA: Diagnosis not present

## 2021-01-16 DIAGNOSIS — H5203 Hypermetropia, bilateral: Secondary | ICD-10-CM | POA: Diagnosis not present

## 2021-01-16 DIAGNOSIS — H52203 Unspecified astigmatism, bilateral: Secondary | ICD-10-CM | POA: Diagnosis not present

## 2021-01-16 DIAGNOSIS — H401134 Primary open-angle glaucoma, bilateral, indeterminate stage: Secondary | ICD-10-CM | POA: Diagnosis not present

## 2021-01-16 DIAGNOSIS — H0100A Unspecified blepharitis right eye, upper and lower eyelids: Secondary | ICD-10-CM | POA: Diagnosis not present

## 2021-01-16 DIAGNOSIS — H353131 Nonexudative age-related macular degeneration, bilateral, early dry stage: Secondary | ICD-10-CM | POA: Diagnosis not present

## 2021-01-16 DIAGNOSIS — H524 Presbyopia: Secondary | ICD-10-CM | POA: Diagnosis not present

## 2021-01-16 DIAGNOSIS — H02834 Dermatochalasis of left upper eyelid: Secondary | ICD-10-CM | POA: Diagnosis not present

## 2021-01-16 DIAGNOSIS — H35033 Hypertensive retinopathy, bilateral: Secondary | ICD-10-CM | POA: Diagnosis not present

## 2021-01-17 DIAGNOSIS — M542 Cervicalgia: Secondary | ICD-10-CM | POA: Diagnosis not present

## 2021-01-17 DIAGNOSIS — H8111 Benign paroxysmal vertigo, right ear: Secondary | ICD-10-CM | POA: Diagnosis not present

## 2021-01-19 DIAGNOSIS — M542 Cervicalgia: Secondary | ICD-10-CM | POA: Diagnosis not present

## 2021-01-19 DIAGNOSIS — H8111 Benign paroxysmal vertigo, right ear: Secondary | ICD-10-CM | POA: Diagnosis not present

## 2021-01-23 DIAGNOSIS — N949 Unspecified condition associated with female genital organs and menstrual cycle: Secondary | ICD-10-CM | POA: Diagnosis not present

## 2021-01-23 DIAGNOSIS — N393 Stress incontinence (female) (male): Secondary | ICD-10-CM | POA: Diagnosis not present

## 2021-01-23 DIAGNOSIS — R159 Full incontinence of feces: Secondary | ICD-10-CM | POA: Diagnosis not present

## 2021-01-23 DIAGNOSIS — H8111 Benign paroxysmal vertigo, right ear: Secondary | ICD-10-CM | POA: Diagnosis not present

## 2021-01-23 DIAGNOSIS — N3941 Urge incontinence: Secondary | ICD-10-CM | POA: Diagnosis not present

## 2021-01-23 DIAGNOSIS — M542 Cervicalgia: Secondary | ICD-10-CM | POA: Diagnosis not present

## 2021-03-02 DIAGNOSIS — M25512 Pain in left shoulder: Secondary | ICD-10-CM | POA: Diagnosis not present

## 2021-03-09 DIAGNOSIS — H52209 Unspecified astigmatism, unspecified eye: Secondary | ICD-10-CM | POA: Diagnosis not present

## 2021-03-09 DIAGNOSIS — H5203 Hypermetropia, bilateral: Secondary | ICD-10-CM | POA: Diagnosis not present

## 2021-03-09 DIAGNOSIS — H524 Presbyopia: Secondary | ICD-10-CM | POA: Diagnosis not present

## 2021-03-20 ENCOUNTER — Other Ambulatory Visit: Payer: Self-pay

## 2021-03-20 ENCOUNTER — Encounter (HOSPITAL_BASED_OUTPATIENT_CLINIC_OR_DEPARTMENT_OTHER): Payer: Self-pay | Admitting: *Deleted

## 2021-03-20 ENCOUNTER — Emergency Department (HOSPITAL_BASED_OUTPATIENT_CLINIC_OR_DEPARTMENT_OTHER): Payer: Medicare HMO

## 2021-03-20 ENCOUNTER — Emergency Department (HOSPITAL_BASED_OUTPATIENT_CLINIC_OR_DEPARTMENT_OTHER)
Admission: EM | Admit: 2021-03-20 | Discharge: 2021-03-20 | Disposition: A | Discharge status: Home / Self Care | Payer: Medicare HMO | Attending: Emergency Medicine | Admitting: Emergency Medicine

## 2021-03-20 DIAGNOSIS — J3489 Other specified disorders of nose and nasal sinuses: Secondary | ICD-10-CM | POA: Diagnosis not present

## 2021-03-20 DIAGNOSIS — I10 Essential (primary) hypertension: Secondary | ICD-10-CM | POA: Insufficient documentation

## 2021-03-20 DIAGNOSIS — E039 Hypothyroidism, unspecified: Secondary | ICD-10-CM | POA: Diagnosis not present

## 2021-03-20 DIAGNOSIS — Z79899 Other long term (current) drug therapy: Secondary | ICD-10-CM | POA: Diagnosis not present

## 2021-03-20 DIAGNOSIS — S0990XA Unspecified injury of head, initial encounter: Secondary | ICD-10-CM | POA: Diagnosis not present

## 2021-03-20 DIAGNOSIS — I672 Cerebral atherosclerosis: Secondary | ICD-10-CM | POA: Diagnosis not present

## 2021-03-20 DIAGNOSIS — U071 COVID-19: Secondary | ICD-10-CM | POA: Diagnosis not present

## 2021-03-20 DIAGNOSIS — W19XXXA Unspecified fall, initial encounter: Secondary | ICD-10-CM

## 2021-03-20 DIAGNOSIS — E119 Type 2 diabetes mellitus without complications: Secondary | ICD-10-CM | POA: Diagnosis not present

## 2021-03-20 DIAGNOSIS — W01198A Fall on same level from slipping, tripping and stumbling with subsequent striking against other object, initial encounter: Secondary | ICD-10-CM | POA: Insufficient documentation

## 2021-03-20 DIAGNOSIS — I6381 Other cerebral infarction due to occlusion or stenosis of small artery: Secondary | ICD-10-CM | POA: Diagnosis not present

## 2021-03-20 LAB — RESP PANEL BY RT-PCR (FLU A&B, COVID) ARPGX2
Influenza A by PCR: NEGATIVE
Influenza B by PCR: NEGATIVE
SARS Coronavirus 2 by RT PCR: POSITIVE — AB

## 2021-03-20 IMAGING — CT CT HEAD W/O CM
3 series · 14 of 47 positions shown, 16 images · non-contrast
Comparison: [DATE]

CLINICAL DATA: Head trauma, minor (Age >= 65y)

EXAM:
CT HEAD WITHOUT CONTRAST
TECHNIQUE: Contiguous axial images were obtained from the base of the skull
through the vertex without intravenous contrast.

[Series 2: head wo · axial · 0.42mm/px · z∈[+1204,+1334]mm · 8 of 32 slices shown, 10 images]
[im 3/32  brain]
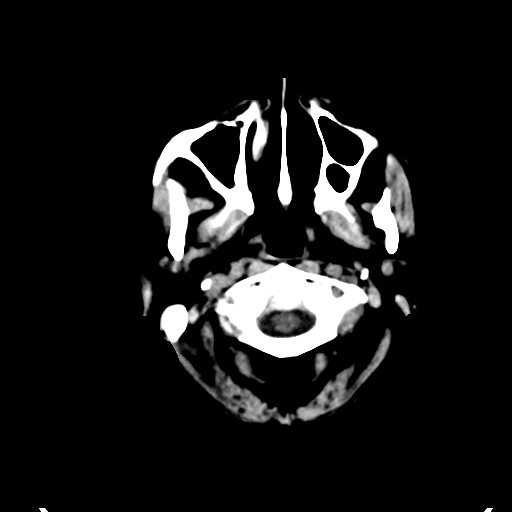
[im 3/32  bone]
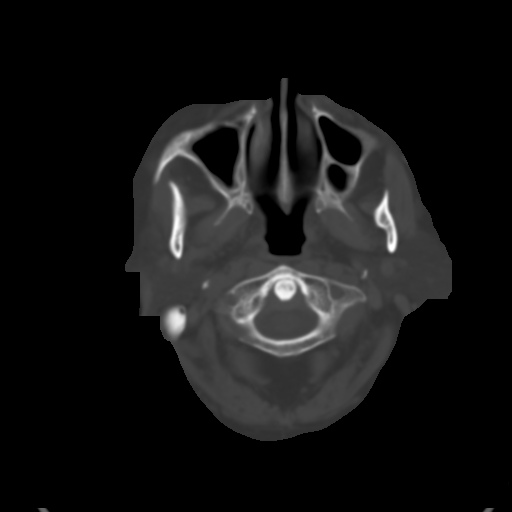
[im 7/32  brain]
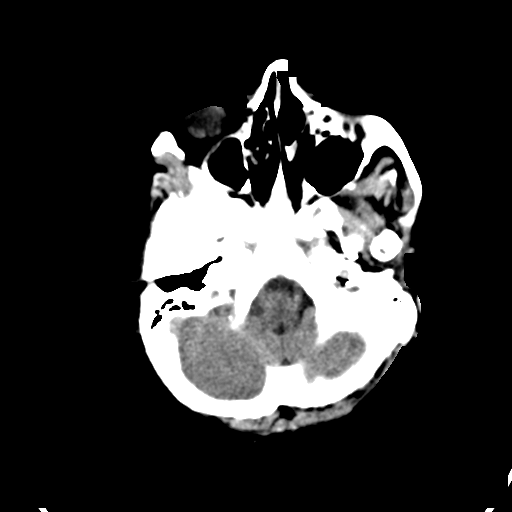
[im 10/32  brain]
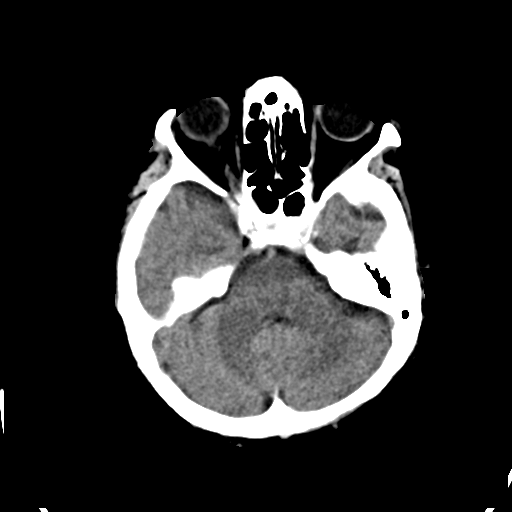
[im 14/32  brain]
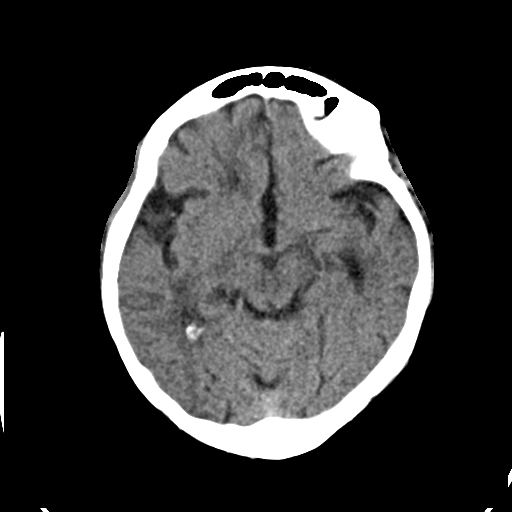
[im 18/32  brain]
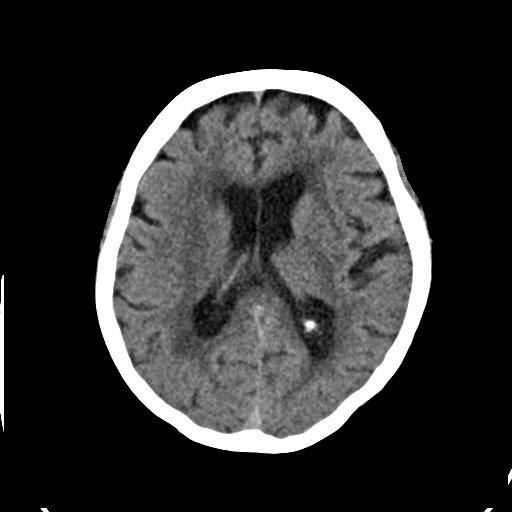
[im 18/32  bone]
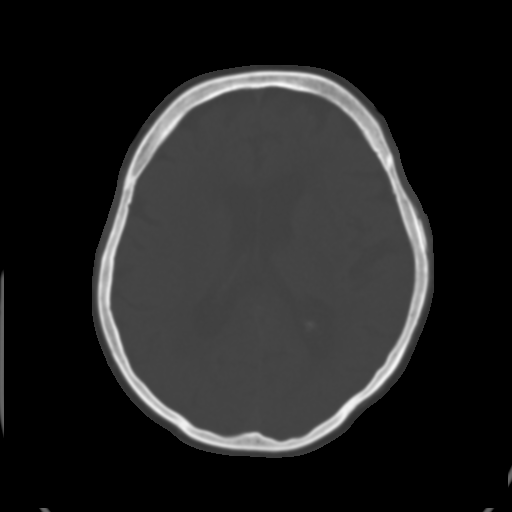
[im 22/32  brain]
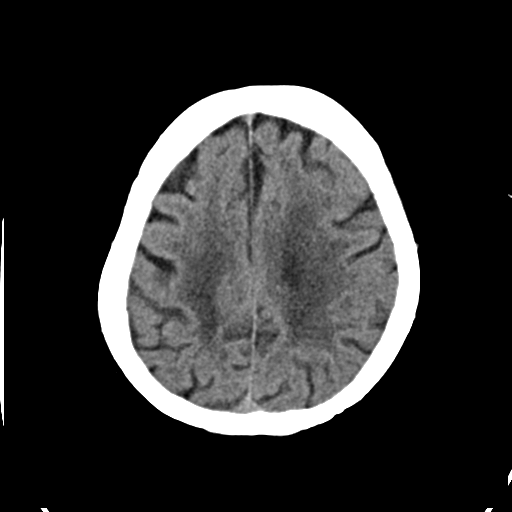
[im 25/32  brain]
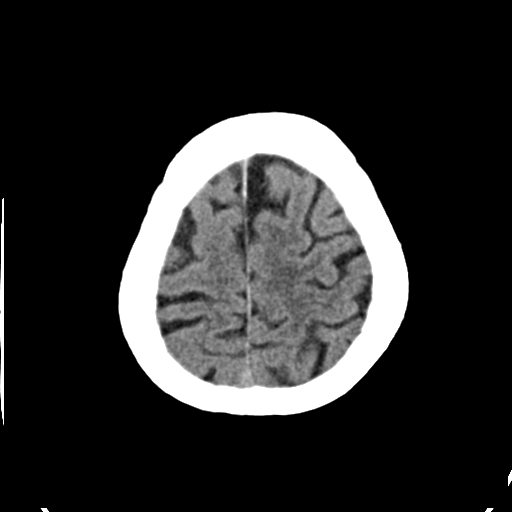
[im 29/32  brain]
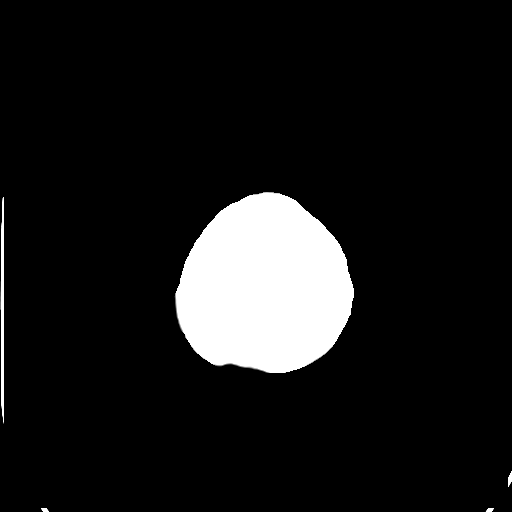

[Series 4: coronal soft · coronal · 0.31mm/px · 3 of 67 slices shown]
[im 23/67  brain]
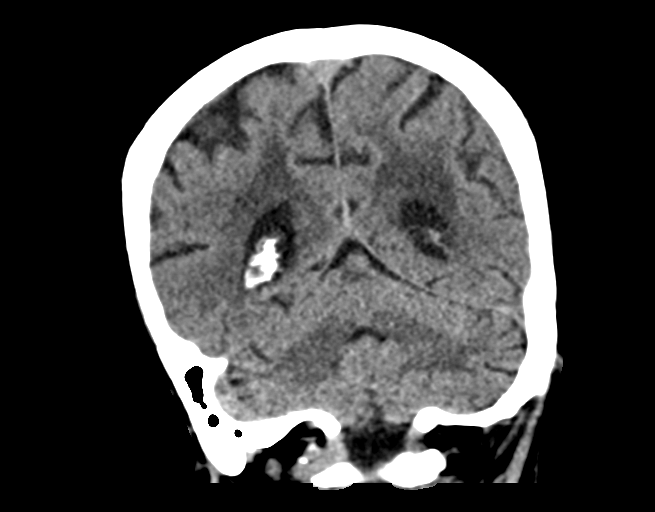
[im 30/67  brain]
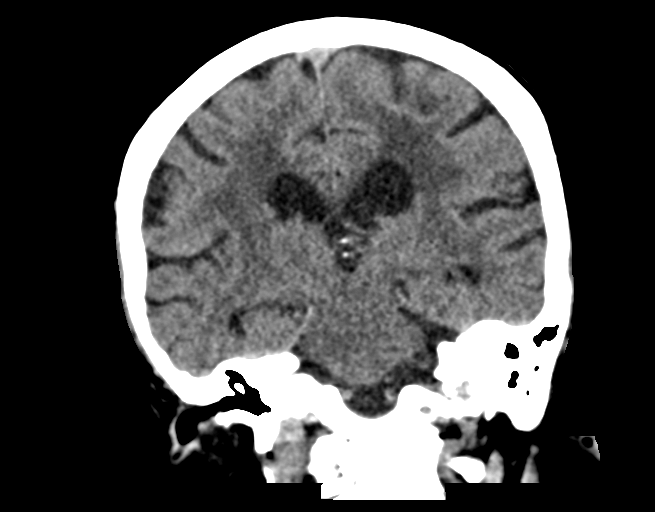
[im 37/67  brain]
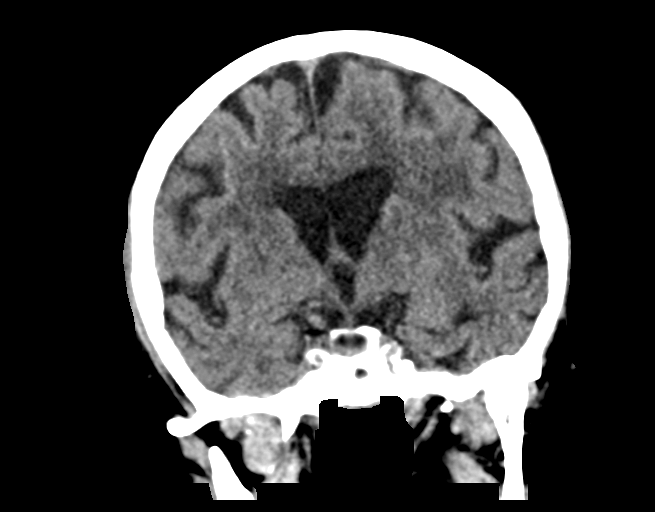

[Series 5: sag soft · sagittal · 0.31mm/px · 3 of 66 slices shown]
[im 22/66  brain]
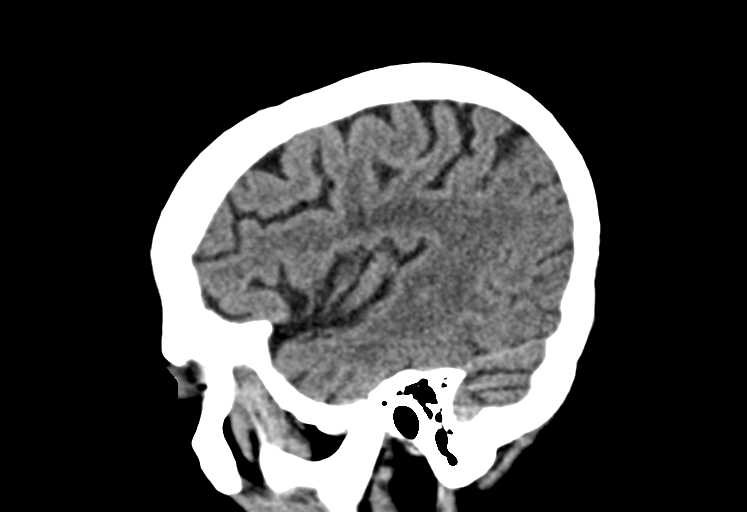
[im 33/66  brain]
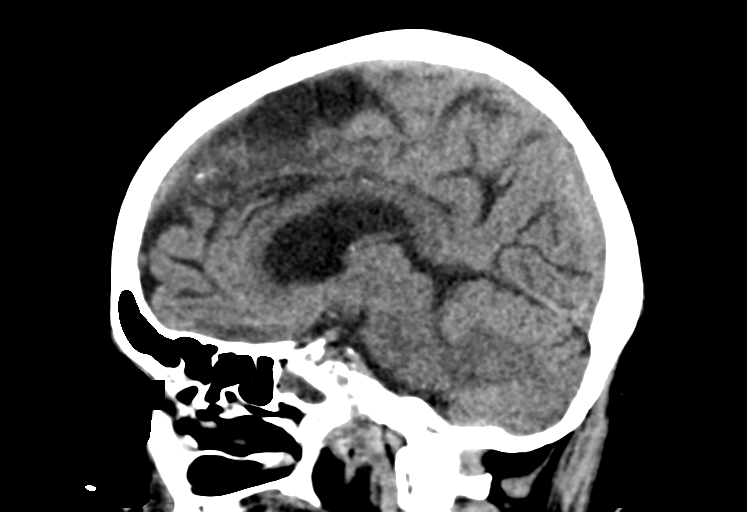
[im 44/66  brain]
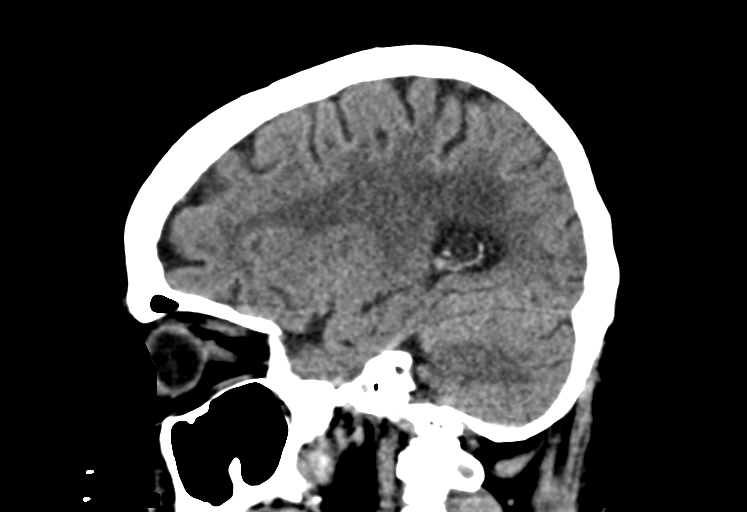

[14 of 47 positions shown; findings below may reference images not displayed]

FINDINGS: Brain: Similar moderate burden chronic ischemic small vessel white
matter disease. Chronic lacunar type infarcts in right thalamus and
right basal ganglia. Similar age related global parenchymal volume
loss with ex vacuo dilatation of ventricular system. No evidence of
acute infarction, hemorrhage, hydrocephalus, extra-axial collection
or mass lesion/mass effect.

Vascular: No hyperdense vessel. Atherosclerotic calcifications the
internal carotid and vertebral arteries at the skull base.

Skull: Normal. Negative for fracture or focal lesion.

Sinuses/Orbits: Increased opacification the bilateral sphenoid, with
mucosal thickening the right maxillary and scattered opacification
of the ethmoid air cells. Mastoid air cells are predominantly clear.
Prior bilateral lens surgery.

Other: None
IMPRESSION: 1. No acute intracranial findings.
2. Similar moderate burden chronic ischemic small vessel white
matter disease and chronic lacunar type infarcts.
3. Increased opacification/mucosal thickening of the paranasal
sinuses.

## 2021-03-20 MED ORDER — NIRMATRELVIR/RITONAVIR (PAXLOVID)TABLET: 3.0000 | ORAL_TABLET | Freq: Two times a day (BID) | ORAL | 0 refills | Status: AC

## 2021-03-20 MED ORDER — FLUTICASONE PROPIONATE 50 MCG/ACT NA SUSP: 2.0000 | Freq: Every day | NASAL | 3 refills | Status: DC

## 2021-03-20 MED ORDER — ACETAMINOPHEN 500 MG PO TABS
1000.0000 mg | ORAL_TABLET | Freq: Once | ORAL | Status: AC
  Administered 2021-03-20: 1000 mg via ORAL
  Filled 2021-03-20: qty 2

## 2021-03-20 NOTE — ED Triage Notes (Addendum)
She bent over, lost her balance and fell this am. She hit her forehead. Her son left because he and the patient have been exposed to Covid. She does not take blood thinners.

## 2021-03-20 NOTE — ED Provider Notes (Addendum)
[facility] EMERGENCY DEPARTMENT Provider Note   CSN: [id number] Arrival date & time: 03/20/21  2047     History Chief Complaint  Patient presents with   Leslie Ward    Leslie Ward is a 87 y.o. female.  Pt presents to the ED today with a headache.  Pt bent over to pick something up and lost her balance and hit her head.  She has also been exposed to Covid.  She c/o a headache to the right side of her head.  She denies sob or cough or fever.      Past Medical History:  Diagnosis Date   Dystonia    Hypertension    Thyroid disease     Patient Active Problem List   Diagnosis Date Noted   Thyroid disease    Ganglion cyst 08/25/2020   Mixed incontinence 03/15/2020   Urgency incontinence 03/15/2020   Vertigo 12/01/2019   Cervical dystonia 03/08/2019   Hyperlipidemia 02/11/2019   Edema 02/11/2019   Hypertension 01/19/2019   Type 2 diabetes mellitus (H[facility]) 01/19/2019   Hypothyroidism (acquired) 01/19/2019   Neck pain 01/19/2019   Dystonia 01/19/2019   Adhesive capsulitis of left shoulder 12/22/2018    Past Surgical History:  Procedure Laterality Date   NO PAST SURGERIES       OB History   No obstetric history on file.     Family History  Problem Relation Age of Onset   Stroke Mother    Heart attack Father    Heart Problems Brother     Social History   Tobacco Use   Smoking status: Never   Smokeless tobacco: Never  Vaping Use   Vaping Use: Never used  Substance Use Topics   Alcohol use: No   Drug use: No    Home Medications Prior to Admission medications   Medication Sig Start Date End Date Taking? Authorizing Provider  nirmatrelvir/ritonavir EUA (PAXLOVID) TABS Take 3 tablets by mouth 2 (two) times daily for 5 days. Patient GFR is >60. Take nirmatrelvir (150 mg) two tablets twice daily for 5 days and ritonavir (100 mg) one tablet twice daily for 5 days. 03/20/21 03/25/21 Yes Jacalyn Lefevre, MD  amLODipine (NORVASC) 5 MG tablet TAKE 1 TABLET(5  MG) BY MOUTH TWICE DAILY 01/02/21   Baldo Daub, MD  Calcium Carbonate (CALCIUM 600 PO) Take 2 tablets by mouth daily.    [provider]  Calcium Polycarbophil (FIBER-CAPS PO) Take 1 capsule by mouth 2 (two) times a day.     [provider]  Cinnamon 500 MG capsule Take 1,000 mg by mouth daily.    [provider]  COVID-19 mRNA vaccine, Moderna, 100 MCG/0.5ML injection INJECT AS DIRECTED 07/21/20 07/21/21  Judyann Munson, MD  fluticasone [facility]) 50 MCG/ACT nasal spray Place 2 sprays into both nostrils daily. 03/20/21   Jacalyn Lefevre, MD  gabapentin (NEURONTIN) 100 MG capsule Take 1 capsule (100 mg total) by mouth at bedtime. 11/20/20   Saguier, Ramon Dredge, PA-C  incobotulinumtoxinA [facility]) 100 units SOLR injection Inject 100 Units into the muscle every 3 (three) months.    [provider]  levothyroxine (SYNTHROID) 50 MCG tablet TAKE 1 TABLET(50 MCG) BY MOUTH DAILY BEFORE AND BREAKFAST 10/25/20   Saguier, Ramon Dredge, PA-C  meclizine (ANTIVERT) 12.5 MG tablet Take 1 tablet (12.5 mg total) by mouth 3 (three) times daily as needed for dizziness. 11/20/20   Saguier, Ramon Dredge, PA-C  MELATONIN PO Take 2-3 tablets by mouth at bedtime as needed (sleep).  [provider]  Menaquinone-7 (VITAMIN K2 PO) Take 1 tablet by mouth at bedtime.     [provider]  metoprolol tartrate (LOPRESSOR) 50 MG tablet Take 1 tablet (50 mg total) by mouth 2 (two) times daily. 12/06/20   Saguier, Ramon Dredge, PA-C  Multiple Vitamin (MULTIVITAMIN) tablet Take 1 tablet by mouth daily.    [provider]  Omega-3 Fatty Acids (OMEGA 3 PO) Take 2 tablets by mouth every evening.     [provider]  Phytosterol Esters (CHOLEST CARE PO) Take 1,600 mg by mouth daily.    [provider]  pravastatin (PRAVACHOL) 20 MG tablet Take 1 tablet (20 mg total) by mouth 2 (two) times a week. 11/13/20 02/11/21  Baldo Daub, MD  Probiotic Product (PROBIOTIC DAILY PO) Take 1  tablet by mouth daily.    [provider]  telmisartan (MICARDIS) 20 MG tablet Take 1 tablet (20 mg total) by mouth daily. 11/09/20   Baldo Daub, MD  vitamin C (ASCORBIC ACID) 500 MG tablet Take 500 mg by mouth every evening.     [provider]    Allergies    Crestor [rosuvastatin calcium]  Review of Systems   Review of Systems  Neurological:  Positive for headaches.  All other systems reviewed and are negative.  Physical Exam Updated Vital Signs BP (!) 144/56 (BP Location: Right Arm)   Pulse 89   Temp 99.2 F (37.3 C) (Oral)   Resp 18   Ht 4\' 9"  (1.448 m)   Wt 54.4 kg   SpO2 98%   BMI 25.95 kg/m   Physical Exam Vitals and nursing note reviewed.  Constitutional:      Appearance: Normal appearance.  HENT:     Head: Normocephalic and atraumatic.      Right Ear: External ear normal.     Left Ear: External ear normal.     Nose: Nose normal.     Mouth/Throat:     Mouth: Mucous membranes are moist.     Pharynx: Oropharynx is clear.  Eyes:     Extraocular Movements: Extraocular movements intact.     Conjunctiva/sclera: Conjunctivae normal.     Pupils: Pupils are equal, round, and reactive to light.  Cardiovascular:     Rate and Rhythm: Normal rate and regular rhythm.     Pulses: Normal pulses.     Heart sounds: Normal heart sounds.  Pulmonary:     Effort: Pulmonary effort is normal.     Breath sounds: Normal breath sounds.  Abdominal:     General: Abdomen is flat. Bowel sounds are normal.     Palpations: Abdomen is soft.  Musculoskeletal:        General: Normal range of motion.     Cervical back: Normal range of motion and neck supple.  Skin:    General: Skin is warm.     Capillary Refill: Capillary refill takes less than 2 seconds.  Neurological:     General: No focal deficit present.     Mental Status: She is alert and oriented to person, place, and time.  Psychiatric:        Mood and Affect: Mood normal.        Behavior: Behavior  normal.    ED Results / Procedures / Treatments   Labs (all labs ordered are listed, but only abnormal results are displayed) Labs Reviewed  RESP PANEL BY RT-PCR (FLU A&B, COVID) [id number] - Abnormal; Notable for the following components:  Result Value   SARS Coronavirus 2 by RT PCR POSITIVE (*)    All other components within normal limits    EKG None  Radiology CT HEAD WO CONTRAST ( )  Result Date: 03/20/2021 CLINICAL DATA:  Head trauma, minor (Age >= 65y) EXAM: CT HEAD WITHOUT CONTRAST TECHNIQUE: Contiguous axial images were obtained from the base of the skull through the vertex without intravenous contrast. COMPARISON:  December 01, 2019 FINDINGS: Brain: Similar moderate burden chronic ischemic small vessel white matter disease. Chronic lacunar type infarcts in right thalamus and right basal ganglia. Similar age related global parenchymal volume loss with ex vacuo dilatation of ventricular system. No evidence of acute infarction, hemorrhage, hydrocephalus, extra-axial collection or mass lesion/mass effect. Vascular: No hyperdense vessel. Atherosclerotic calcifications the internal carotid and vertebral arteries at the skull base. Skull: Normal. Negative for fracture or focal lesion. Sinuses/Orbits: Increased opacification the bilateral sphenoid, with mucosal thickening the right maxillary and scattered opacification of the ethmoid air cells. Mastoid air cells are predominantly clear. Prior bilateral lens surgery. Other: None IMPRESSION: 1. No acute intracranial findings. 2. Similar moderate burden chronic ischemic small vessel white matter disease and chronic lacunar type infarcts. 3. Increased opacification/mucosal thickening of the paranasal sinuses. Electronically Signed   By: Maudry Mayhew MD   On: 03/20/2021 22:03    Procedures Procedures   Medications Ordered in ED Medications  acetaminophen (TYLENOL) tablet 1,000 mg (1,000 mg Oral Given 03/20/21 2143)    ED Course  I have  reviewed the triage vital signs and the nursing notes.  Pertinent labs & imaging results that were available during my care of the patient were reviewed by me and considered in my medical decision making (see chart for details).    MDM Rules/Calculators/A&P                           Pt's ct head negative other than some sinus inflammation.  This is likely from her Covid + status.  Pt is told to take flonase and tylenol. She had normal kidney function in March.  She is stable for d/c.  Return if worse.  Amirah Goerke was evaluated in Emergency Department on 03/20/2021 for the symptoms described in the history of present illness. She was evaluated in the context of the global COVID-19 pandemic, which necessitated consideration that the patient might be at risk for infection with the SARS-CoV-2 virus that causes COVID-19. Institutional protocols and algorithms that pertain to the evaluation of patients at risk for COVID-19 are in a state of rapid change based on information released by regulatory bodies including the CDC and federal and state organizations. These policies and algorithms were followed during the patient's care in the ED.  Final Clinical Impression(s) / ED Diagnoses Final diagnoses:  COVID-19  Fall, initial encounter  Injury of head, initial encounter    Rx / DC Orders ED Discharge Orders          Ordered    nirmatrelvir/ritonavir EUA (PAXLOVID) TABS  2 times daily        03/20/21 2257    fluticasone (FLONASE) 50 MCG/ACT nasal spray  Daily        03/20/21 2257             Jacalyn Lefevre, MD 03/20/21 2259    Jacalyn Lefevre, MD 03/20/21 2300

## 2021-03-20 NOTE — Discharge Instructions (Addendum)
Person Under Monitoring Name: Leslie Ward  Location: [id number] [facility] [address] [place] [address]   Infection Prevention Recommendations for Individuals Confirmed to have, or Being Evaluated for, 2019 Novel Coronavirus (COVID-19) Infection Who Receive Care at Home  Individuals who are confirmed to have, or are being evaluated for, COVID-19 should follow the prevention steps below until a healthcare provider or local or state health department says they can return to normal activities.  Stay home except to get medical care You should restrict activities outside your home, except for getting medical care. Do not go to work, school, or public areas, and do not use public transportation or taxis.  Call ahead before visiting your doctor Before your medical appointment, call the healthcare provider and tell them that you have, or are being evaluated for, COVID-19 infection. This will help the healthcare provider's office take steps to keep other people from getting infected. Ask your healthcare provider to call the local or state health department.  Monitor your symptoms Seek prompt medical attention if your illness is worsening (e.g., difficulty breathing). Before going to your medical appointment, call the healthcare provider and tell them that you have, or are being evaluated for, COVID-19 infection. Ask your healthcare provider to call the local or state health department.  Wear a facemask You should wear a facemask that covers your nose and mouth when you are in the same room with other people and when you visit a healthcare provider. People who live with or visit you should also wear a facemask while they are in the same room with you.  Separate yourself from other people in your home As much as possible, you should stay in a different room from other people in your home. Also, you should use a separate bathroom, if available.  Avoid sharing household items You should not  share dishes, drinking glasses, cups, eating utensils, towels, bedding, or other items with other people in your home. After using these items, you should wash them thoroughly with soap and water.  Cover your coughs and sneezes Cover your mouth and nose with a tissue when you cough or sneeze, or you can cough or sneeze into your sleeve. Throw used tissues in a lined trash can, and immediately wash your hands with soap and water for at least 20 seconds or use an alcohol-based hand rub.  Wash your [employer] your hands often and thoroughly with soap and water for at least 20 seconds. You can use an alcohol-based hand sanitizer if soap and water are not available and if your hands are not visibly dirty. Avoid touching your eyes, nose, and mouth with unwashed hands.   Prevention Steps for Caregivers and Household Members of Individuals Confirmed to have, or Being Evaluated for, COVID-19 Infection Being Cared for in the Home  If you live with, or provide care at home for, a person confirmed to have, or being evaluated for, COVID-19 infection please follow these guidelines to prevent infection:  Follow healthcare provider's instructions Make sure that you understand and can help the patient follow any healthcare provider instructions for all care.  Provide for the patient's basic needs You should help the patient with basic needs in the home and provide support for getting groceries, prescriptions, and other personal needs.  Monitor the patient's symptoms If they are getting sicker, call his or her medical provider and tell them that the patient has, or is being evaluated for, COVID-19 infection. This will help the healthcare provider's office  take steps to keep other people from getting infected. Ask the healthcare provider to call the local or state health department.  Limit the number of people who have contact with the patient If possible, have only one caregiver for the  patient. Other household members should stay in another home or place of residence. If this is not possible, they should stay in another room, or be separated from the patient as much as possible. Use a separate bathroom, if available. Restrict visitors who do not have an essential need to be in the home.  Keep older adults, very young children, and other sick people away from the patient Keep older adults, very young children, and those who have compromised immune systems or chronic health conditions away from the patient. This includes people with chronic heart, lung, or kidney conditions, diabetes, and cancer.  Ensure good ventilation Make sure that shared spaces in the home have good air flow, such as from an air conditioner or an opened window, weather permitting.  Wash your hands often Wash your hands often and thoroughly with soap and water for at least 20 seconds. You can use an alcohol based hand sanitizer if soap and water are not available and if your hands are not visibly dirty. Avoid touching your eyes, nose, and mouth with unwashed hands. Use disposable paper towels to dry your hands. If not available, use dedicated cloth towels and replace them when they become wet.  Wear a facemask and gloves Wear a disposable facemask at all times in the room and gloves when you touch or have contact with the patient's blood, body fluids, and/or secretions or excretions, such as sweat, saliva, sputum, nasal mucus, vomit, urine, or feces.  Ensure the mask fits over your nose and mouth tightly, and do not touch it during use. Throw out disposable facemasks and gloves after using them. Do not reuse. Wash your hands immediately after removing your facemask and gloves. If your personal clothing becomes contaminated, carefully remove clothing and launder. Wash your hands after handling contaminated clothing. Place all used disposable facemasks, gloves, and other waste in a lined container before  disposing them with other household waste. Remove gloves and wash your hands immediately after handling these items.  Do not share dishes, glasses, or other household items with the patient Avoid sharing household items. You should not share dishes, drinking glasses, cups, eating utensils, towels, bedding, or other items with a patient who is confirmed to have, or being evaluated for, COVID-19 infection. After the person uses these items, you should wash them thoroughly with soap and water.  Wash laundry thoroughly Immediately remove and wash clothes or bedding that have blood, body fluids, and/or secretions or excretions, such as sweat, saliva, sputum, nasal mucus, vomit, urine, or feces, on them. Wear gloves when handling laundry from the patient. Read and follow directions on labels of laundry or clothing items and detergent. In general, wash and dry with the warmest temperatures recommended on the label.  Clean all areas the individual has used often Clean all touchable surfaces, such as counters, tabletops, doorknobs, bathroom fixtures, toilets, phones, keyboards, tablets, and bedside tables, every day. Also, clean any surfaces that may have blood, body fluids, and/or secretions or excretions on them. Wear gloves when cleaning surfaces the patient has come in contact with. Use a diluted bleach solution (e.g., dilute bleach with 1 part bleach and 10 parts water) or a household disinfectant with a label that says EPA-registered for coronaviruses. To make a bleach  solution at home, add 1 tablespoon of bleach to 1 quart (4 cups) of water. For a larger supply, add  cup of bleach to 1 gallon (16 cups) of water. Read labels of cleaning products and follow recommendations provided on product labels. Labels contain instructions for safe and effective use of the cleaning product including precautions you should take when applying the product, such as wearing gloves or eye protection and making sure you  have good ventilation during use of the product. Remove gloves and wash hands immediately after cleaning.  Monitor yourself for signs and symptoms of illness Caregivers and household members are considered close contacts, should monitor their health, and will be asked to limit movement outside of the home to the extent possible. Follow the monitoring steps for close contacts listed on the symptom monitoring form.   ? If you have additional questions, contact your local health department or call the epidemiologist on call at [phone] (available 24/7). ? This guidance is subject to change. For the most up-to-date guidance from [facility], please refer to their website: [email or web address]

## 2021-04-05 ENCOUNTER — Encounter: Payer: Self-pay | Admitting: Medical

## 2021-04-05 ENCOUNTER — Ambulatory Visit (HOSPITAL_BASED_OUTPATIENT_CLINIC_OR_DEPARTMENT_OTHER)
Admission: RE | Admit: 2021-04-05 | Discharge: 2021-04-05 | Disposition: A | Discharge status: Home / Self Care | Payer: Medicare HMO | Source: Ambulatory Visit | Attending: Medical | Admitting: Medical

## 2021-04-05 ENCOUNTER — Telehealth (INDEPENDENT_AMBULATORY_CARE_PROVIDER_SITE_OTHER): Payer: Medicare HMO | Admitting: Medical

## 2021-04-05 ENCOUNTER — Other Ambulatory Visit (INDEPENDENT_AMBULATORY_CARE_PROVIDER_SITE_OTHER): Payer: Medicare HMO

## 2021-04-05 ENCOUNTER — Other Ambulatory Visit: Payer: Self-pay

## 2021-04-05 VITALS — BP 159/72 | HR 86

## 2021-04-05 DIAGNOSIS — R059 Cough, unspecified: Secondary | ICD-10-CM

## 2021-04-05 DIAGNOSIS — M791 Myalgia, unspecified site: Secondary | ICD-10-CM

## 2021-04-05 DIAGNOSIS — R5383 Other fatigue: Secondary | ICD-10-CM

## 2021-04-05 DIAGNOSIS — R0981 Nasal congestion: Secondary | ICD-10-CM

## 2021-04-05 DIAGNOSIS — Z8616 Personal history of COVID-19: Secondary | ICD-10-CM | POA: Diagnosis not present

## 2021-04-05 IMAGING — DX DG CHEST 2V
2 series · 2 of 2 positions shown · non-contrast
Comparison: [DATE]

CLINICAL DATA: Myalgia, cough, body aches. Recent coronavirus
infection.

EXAM:
CHEST - 2 VIEW

[chest pa]
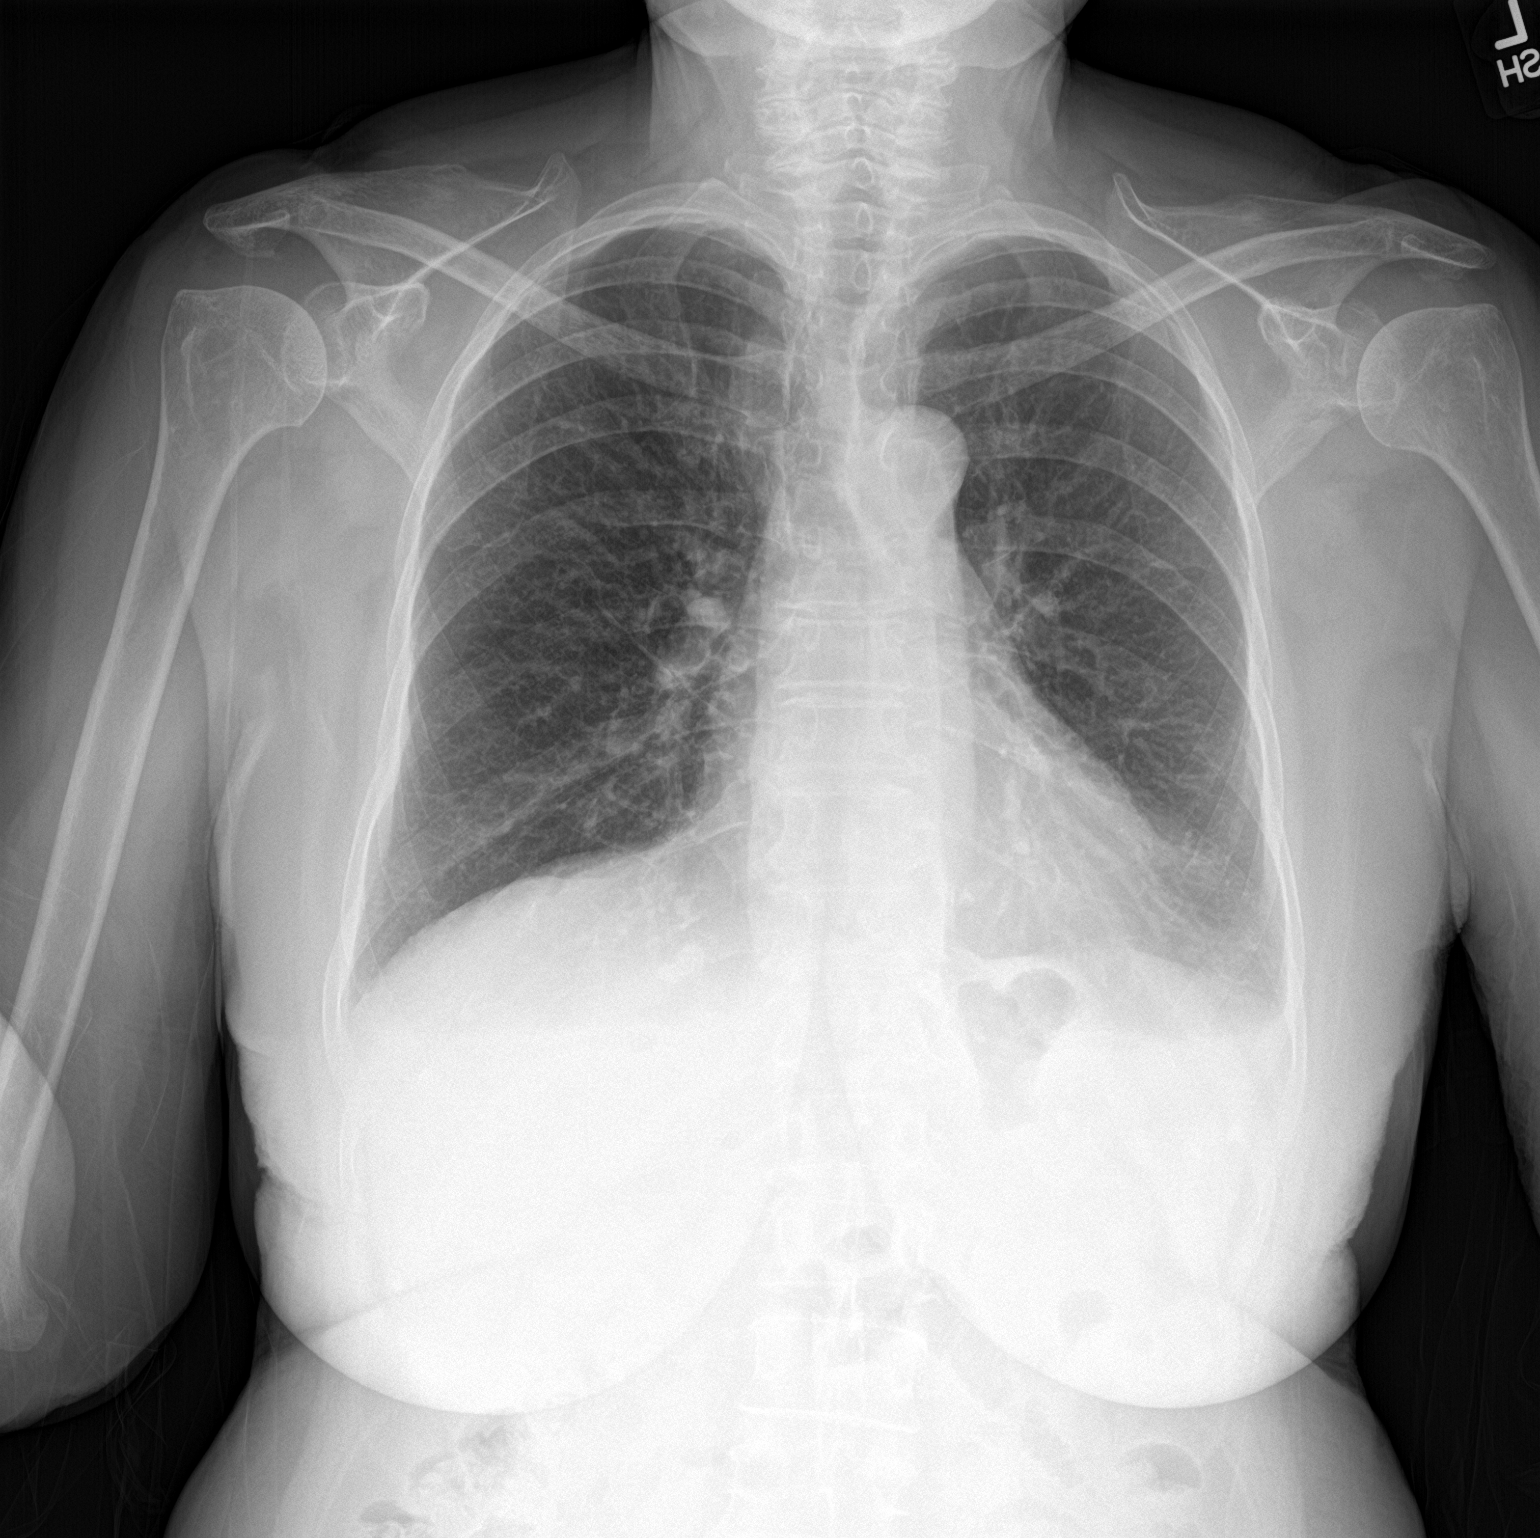

[chest lat]
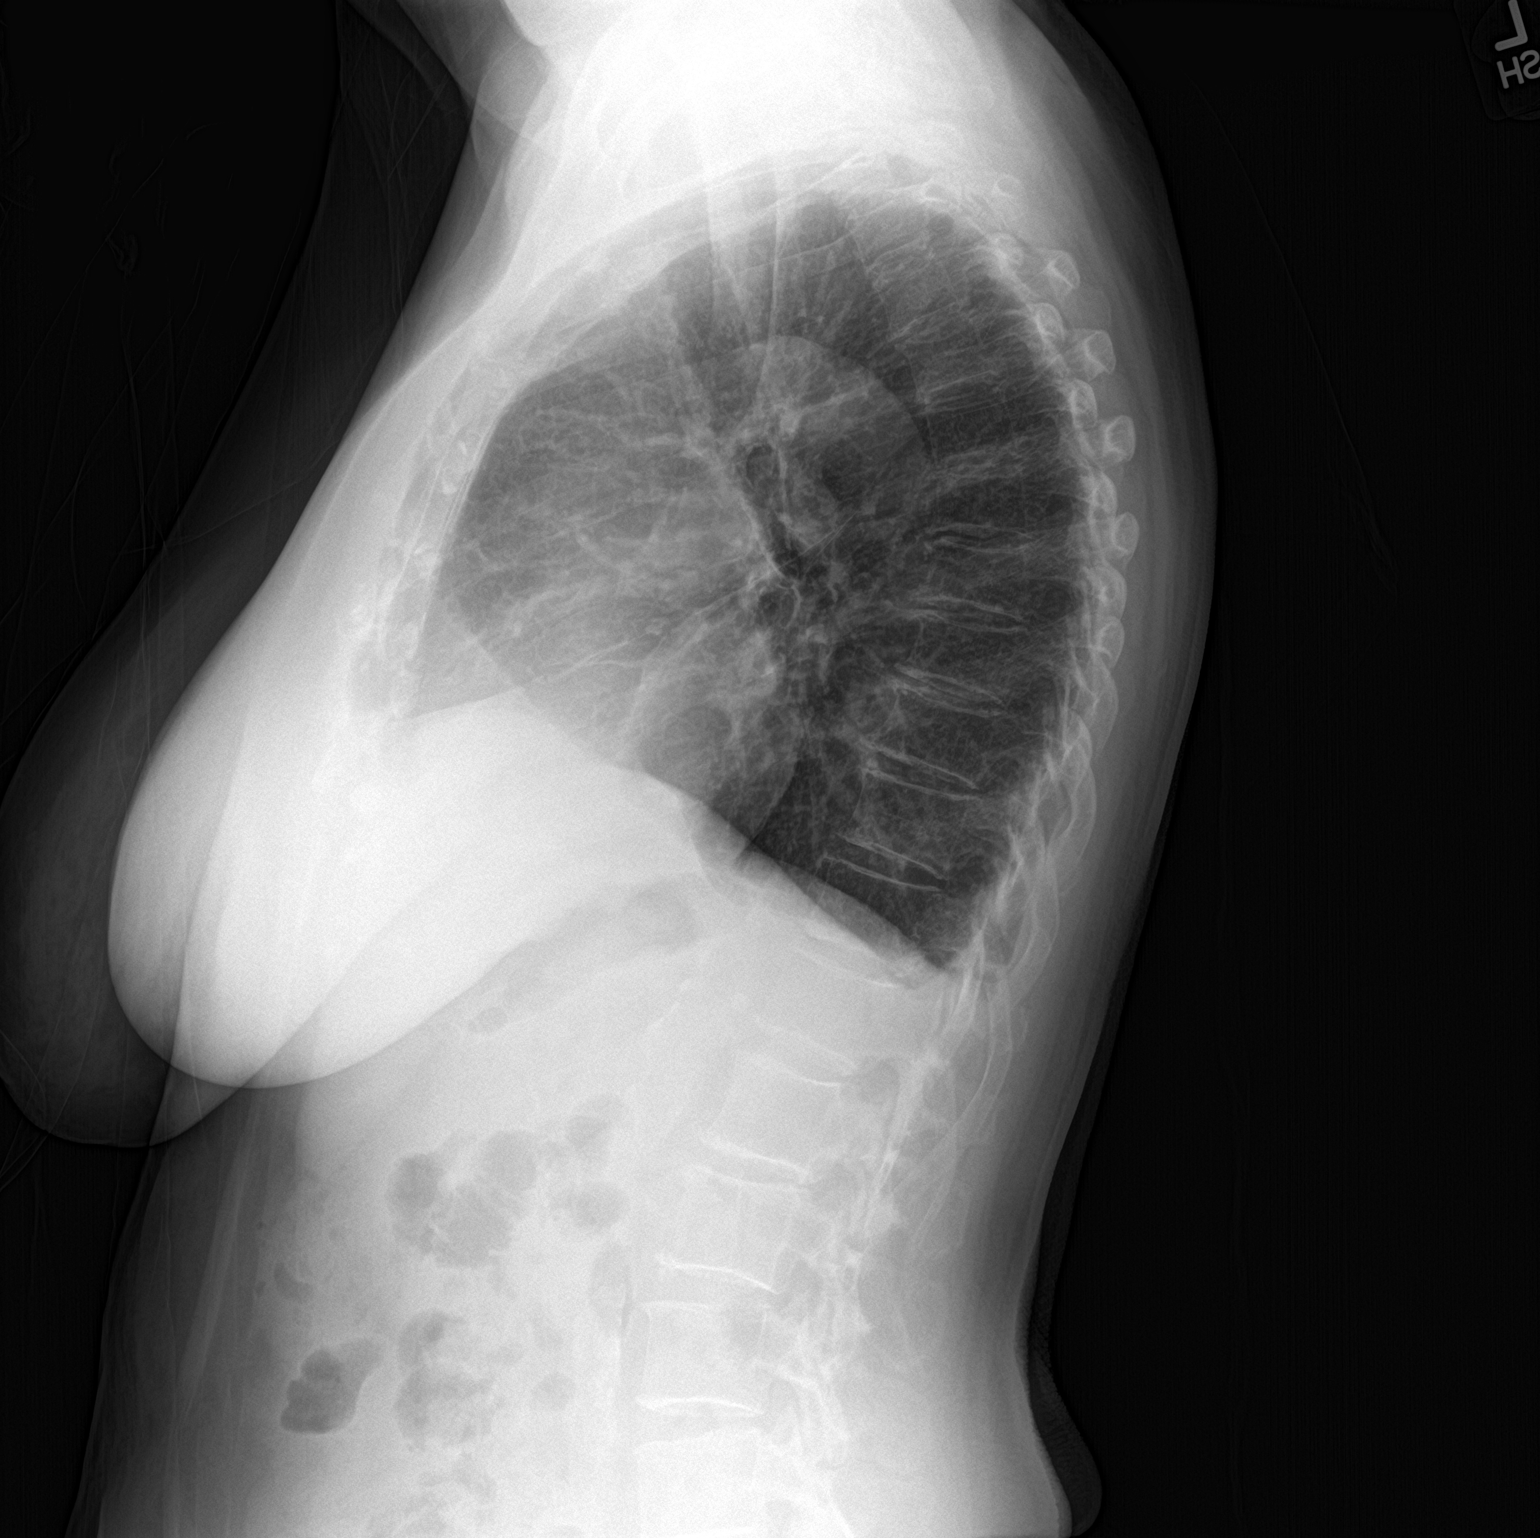

[2 of 2 positions shown; findings below may reference images not displayed]

FINDINGS: Heart size remains normal. Mediastinal shadows are normal except for
ordinary atherosclerotic calcification of the thoracic aorta. The
right chest is clear. I think there may be a small amount of pleural
fluid on the left with minimal left base atelectasis. No
consolidation or lobar collapse. No significant bone finding.
IMPRESSION: Question very small amount of pleural fluid on the left with minimal
left base atelectasis. Otherwise no active disease.

## 2021-04-05 MED ORDER — AZITHROMYCIN 250 MG PO TABS: ORAL_TABLET | ORAL | 0 refills | Status: AC

## 2021-04-05 MED ORDER — BENZONATATE 100 MG PO CAPS: 100.0000 mg | ORAL_CAPSULE | Freq: Three times a day (TID) | ORAL | 0 refills | Status: DC | PRN

## 2021-04-05 NOTE — Progress Notes (Addendum)
   Subjective:    Patient ID: Leslie Ward, female    DOB: 08/05/1931, 87 y.o.   MRN: [id number]  HPI  Virtual Visit via Video Note  I connected with [name] on 04/05/21 at  9:40 AM EDT by a video enabled telemedicine application and verified that I am speaking with the correct person using two identifiers.  Location: Patient: home Provider: office   I discussed the limitations of evaluation and management by telemedicine and the availability of in person appointments. The patient expressed understanding and agreed to proceed.  History of Present Illness:   Pt in for some recent bilateral upper back, neck pain and fatigue and nasal congestion.  Pt is on flonase.2 weeks ago went to ED. She had covid at that time. Pt did get paxlovid on 03/20/2021. She felt better and now having recurrent symptoms over past 4-5 days.  Pt had 2 vaccines and 2 boosters.   Pt is coughing some. 91-92%. But 96% presently. Hovering later around 94%.   Pt has not repeat rapid covid test.   Did not check bp med today.   Observations/Objective:  General-no acute distress, pleasant, oriented. Lungs- on inspection lungs appear unlabored. Neck- no tracheal deviation or jvd on inspection. Neuro- gross motor function appears intact.    Assessment and Plan: ollow Up Instructions:   Recent COVID infection 2 weeks ago and did receive antiviral therapy.  Some interval improvement and then started to feel recurrent mild type symptoms.  Was present recently is fatigue, nasal congestion neck pain and upper back pain.  Patient been initial 2 vaccine series and boosted twice.   Son expressed concern for rebound symptoms post antiviral treatment.  Sometime this past since taking antiviral also do not think that likely but did ask son to do rapid test at home and update me on those results.  If still positive then would prescribe second round of antiviral.  Presently will get chest x-ray to rule out  pneumonia.  Also will get CBC and CMP to assess status as well.  Can use Flonase nasal spray for congestion.  Also will be prescribing azithromycin to cover for sinus infection and possible bronchitis.  If signs and symptoms linger/persist despite above measures then would recommend an office visit/evaluation.  This would be better than a video visit which was scheduled today.  With patient's age and if any persisting signs symptoms then definitely an office visit would be indicated.   I discussed the assessment and treatment plan with the patient. The patient was provided an opportunity to ask questions and all were answered. The patient agreed with the plan and demonstrated an understanding of the instructions.   The patient was advised to call back or seek an in-person evaluation if the symptoms worsen or if the condition fails to improve as anticipated.  Time spent with patient today was  31 minutes which consisted of chart review, discussing diagnosis, work up ,treatment and documentation.    [name], PA-C   Review of Systems     Objective:   Physical Exam        Assessment & Plan:

## 2021-04-06 ENCOUNTER — Encounter: Payer: Self-pay | Admitting: Medical

## 2021-04-06 LAB — COMPREHENSIVE METABOLIC PANEL
ALT: 44 U/L — ABNORMAL HIGH (ref 0–35)
AST: 22 U/L (ref 0–37)
Albumin: 3.5 g/dL (ref 3.5–5.2)
Alkaline Phosphatase: 90 U/L (ref 39–117)
BUN: 22 mg/dL (ref 6–23)
CO2: 28 mEq/L (ref 19–32)
Calcium: 9 mg/dL (ref 8.4–10.5)
Chloride: 101 mEq/L (ref 96–112)
Creatinine, Ser: 0.73 mg/dL (ref 0.40–1.20)
GFR: 73.38 mL/min (ref >60.00)
Glucose, Bld: 128 mg/dL — ABNORMAL HIGH (ref 70–99)
Potassium: 3.7 mEq/L (ref 3.5–5.1)
Sodium: 140 mEq/L (ref 135–145)
Total Bilirubin: 0.7 mg/dL (ref 0.2–1.2)
Total Protein: 6.7 g/dL (ref 6.0–8.3)

## 2021-04-06 LAB — CBC WITH DIFFERENTIAL/PLATELET
Basophils Absolute: 0.1 10*3/uL (ref 0.0–0.1)
Basophils Relative: 0.7 % (ref 0.0–3.0)
Eosinophils Absolute: 0 10*3/uL (ref 0.0–0.7)
Eosinophils Relative: 0.3 % (ref 0.0–5.0)
HCT: 35.9 % — ABNORMAL LOW (ref 36.0–46.0)
Hemoglobin: 12.1 g/dL (ref 12.0–15.0)
Lymphocytes Relative: 23.8 % (ref 12.0–46.0)
Lymphs Abs: 2.5 10*3/uL (ref 0.7–4.0)
MCHC: 33.8 g/dL (ref 30.0–36.0)
MCV: 90.5 fl (ref 78.0–100.0)
Monocytes Absolute: 0.7 10*3/uL (ref 0.1–1.0)
Monocytes Relative: 6.7 % (ref 3.0–12.0)
Neutro Abs: 7.1 10*3/uL (ref 1.4–7.7)
Neutrophils Relative %: 68.5 % (ref 43.0–77.0)
Platelets: 358 10*3/uL (ref 150.0–400.0)
RBC: 3.97 Mil/uL (ref 3.87–5.11)
RDW: 14 % (ref 11.5–15.5)
WBC: 10.4 10*3/uL (ref 4.0–10.5)

## 2021-04-09 ENCOUNTER — Telehealth: Payer: Self-pay

## 2021-04-09 ENCOUNTER — Encounter: Payer: Self-pay | Admitting: Medical

## 2021-04-09 MED ORDER — FLUTICASONE PROPIONATE 50 MCG/ACT NA SUSP: 2.0000 | Freq: Every day | NASAL | 1 refills | Status: DC

## 2021-04-09 NOTE — Addendum Note (Signed)
Addended by: Gwenevere Abbot on: 04/09/2021 04:47 PM   Modules accepted: Orders

## 2021-04-09 NOTE — Patient Instructions (Signed)
Recent COVID infection 2 weeks ago and did receive antiviral therapy.  Some interval improvement and then started to feel recurrent mild type symptoms.  Was present recently is fatigue, nasal congestion neck pain and upper back pain.  Patient been initial 2 vaccine series and boosted twice.   Son expressed concern for rebound symptoms post antiviral treatment.  Sometime this past since taking antiviral also do not think that likely but did ask son to do rapid test at home and update me on those results.  If still positive then would prescribe second round of antiviral.  Presently will get chest x-ray to rule out pneumonia.  Also will get CBC and CMP to assess status as well.  Can use Flonase nasal spray for congestion.  Also will be prescribing azithromycin to cover for sinus infection and possible bronchitis.  If signs and symptoms linger/persist despite above measures then would recommend an office visit/evaluation.  This would be better than a video visit which was scheduled today.  With patient's age and if any persisting signs symptoms then definitely an office visit would be indicated.

## 2021-04-20 ENCOUNTER — Encounter: Payer: Self-pay | Admitting: Medical

## 2021-04-20 ENCOUNTER — Ambulatory Visit (HOSPITAL_BASED_OUTPATIENT_CLINIC_OR_DEPARTMENT_OTHER)
Admission: RE | Admit: 2021-04-20 | Discharge: 2021-04-20 | Disposition: A | Discharge status: Home / Self Care | Payer: Medicare HMO | Source: Ambulatory Visit | Attending: Medical | Admitting: Medical

## 2021-04-20 ENCOUNTER — Other Ambulatory Visit: Payer: Self-pay

## 2021-04-20 ENCOUNTER — Ambulatory Visit (INDEPENDENT_AMBULATORY_CARE_PROVIDER_SITE_OTHER): Payer: Medicare HMO | Admitting: Medical

## 2021-04-20 VITALS — BP 150/60 | HR 81 | Temp 99.0°F | Resp 18 | Ht <= 58 in | Wt 115.2 lb

## 2021-04-20 DIAGNOSIS — M25519 Pain in unspecified shoulder: Secondary | ICD-10-CM

## 2021-04-20 DIAGNOSIS — R9389 Abnormal findings on diagnostic imaging of other specified body structures: Secondary | ICD-10-CM | POA: Diagnosis not present

## 2021-04-20 DIAGNOSIS — R059 Cough, unspecified: Secondary | ICD-10-CM | POA: Diagnosis not present

## 2021-04-20 DIAGNOSIS — M542 Cervicalgia: Secondary | ICD-10-CM | POA: Diagnosis not present

## 2021-04-20 DIAGNOSIS — M791 Myalgia, unspecified site: Secondary | ICD-10-CM

## 2021-04-20 DIAGNOSIS — M2550 Pain in unspecified joint: Secondary | ICD-10-CM | POA: Diagnosis not present

## 2021-04-20 DIAGNOSIS — R5383 Other fatigue: Secondary | ICD-10-CM | POA: Diagnosis not present

## 2021-04-20 DIAGNOSIS — R700 Elevated erythrocyte sedimentation rate: Secondary | ICD-10-CM

## 2021-04-20 IMAGING — DX DG CHEST 2V
2 series · 2 of 2 positions shown · non-contrast
Comparison: [DATE]

CLINICAL DATA: Coughing

EXAM:
CHEST - 2 VIEW

[chest pa]
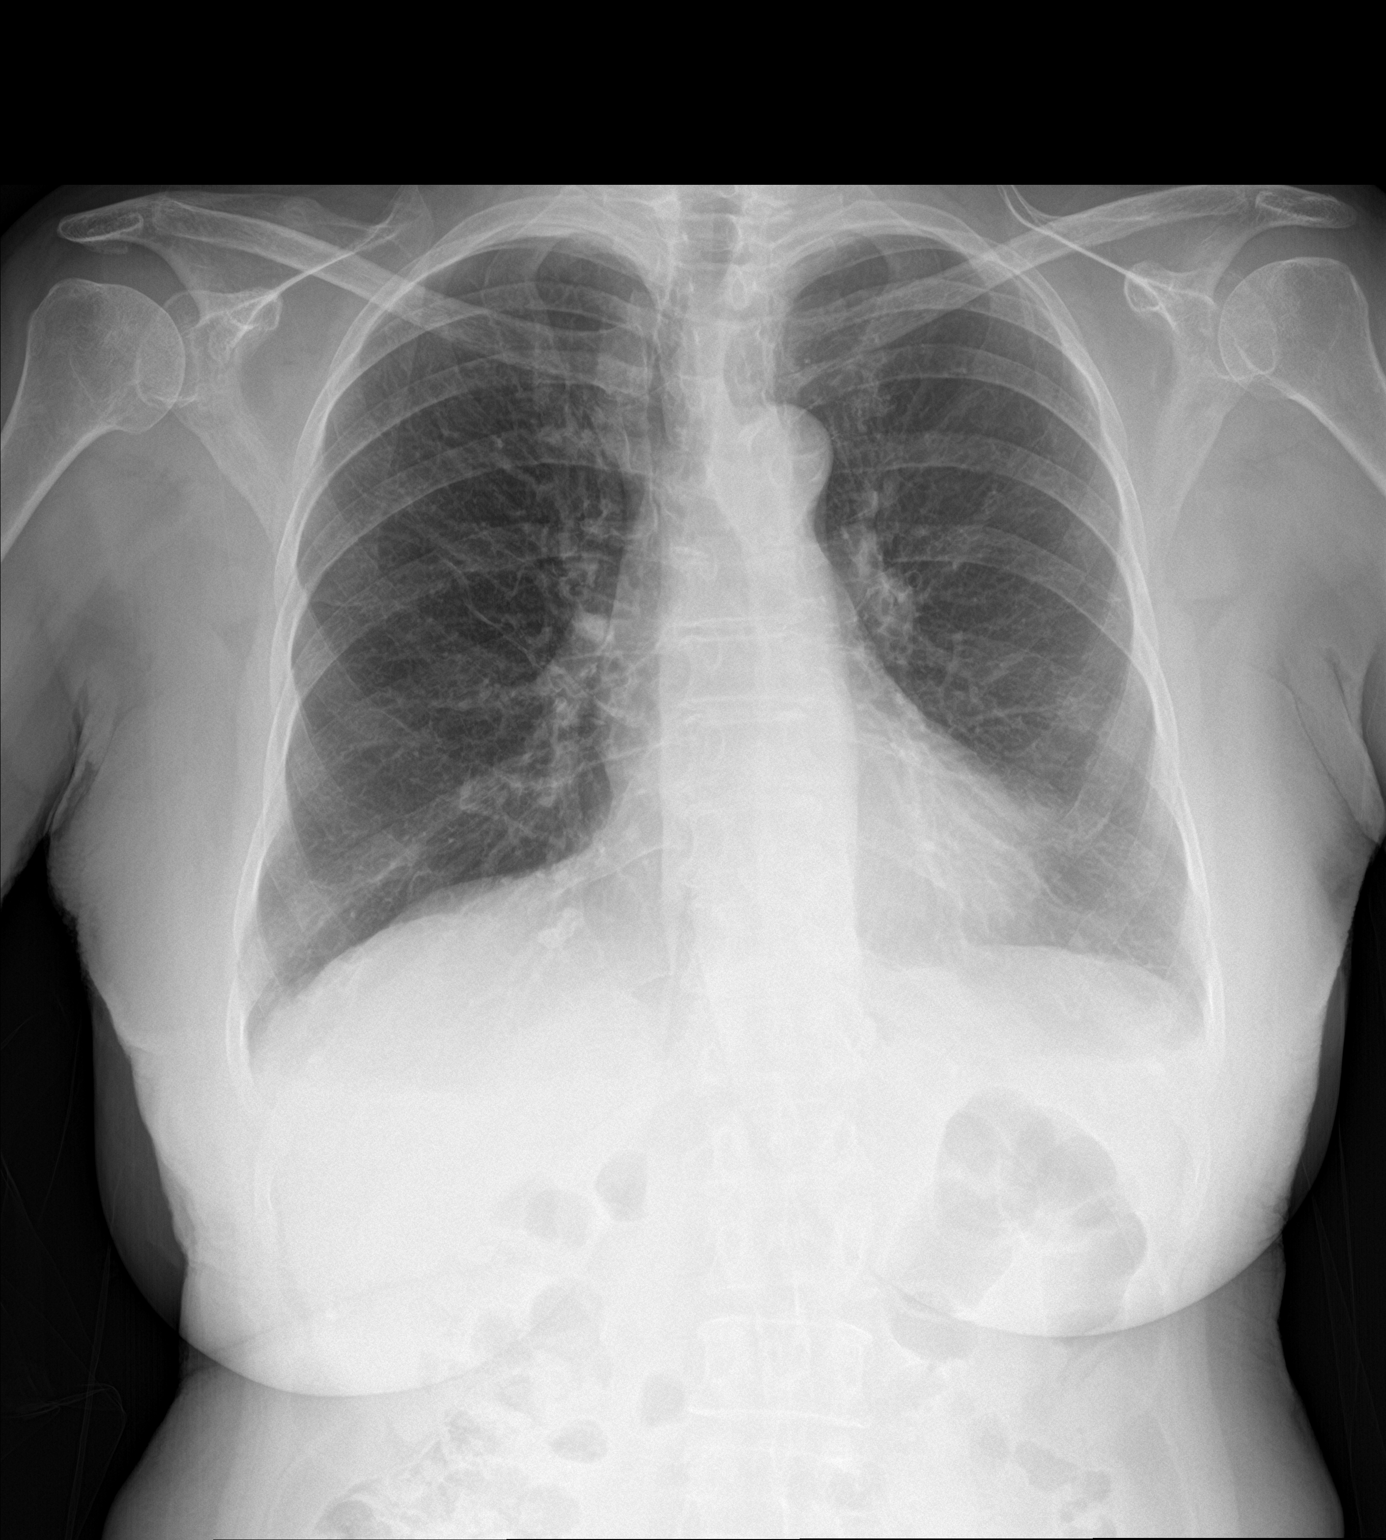

[chest lat]
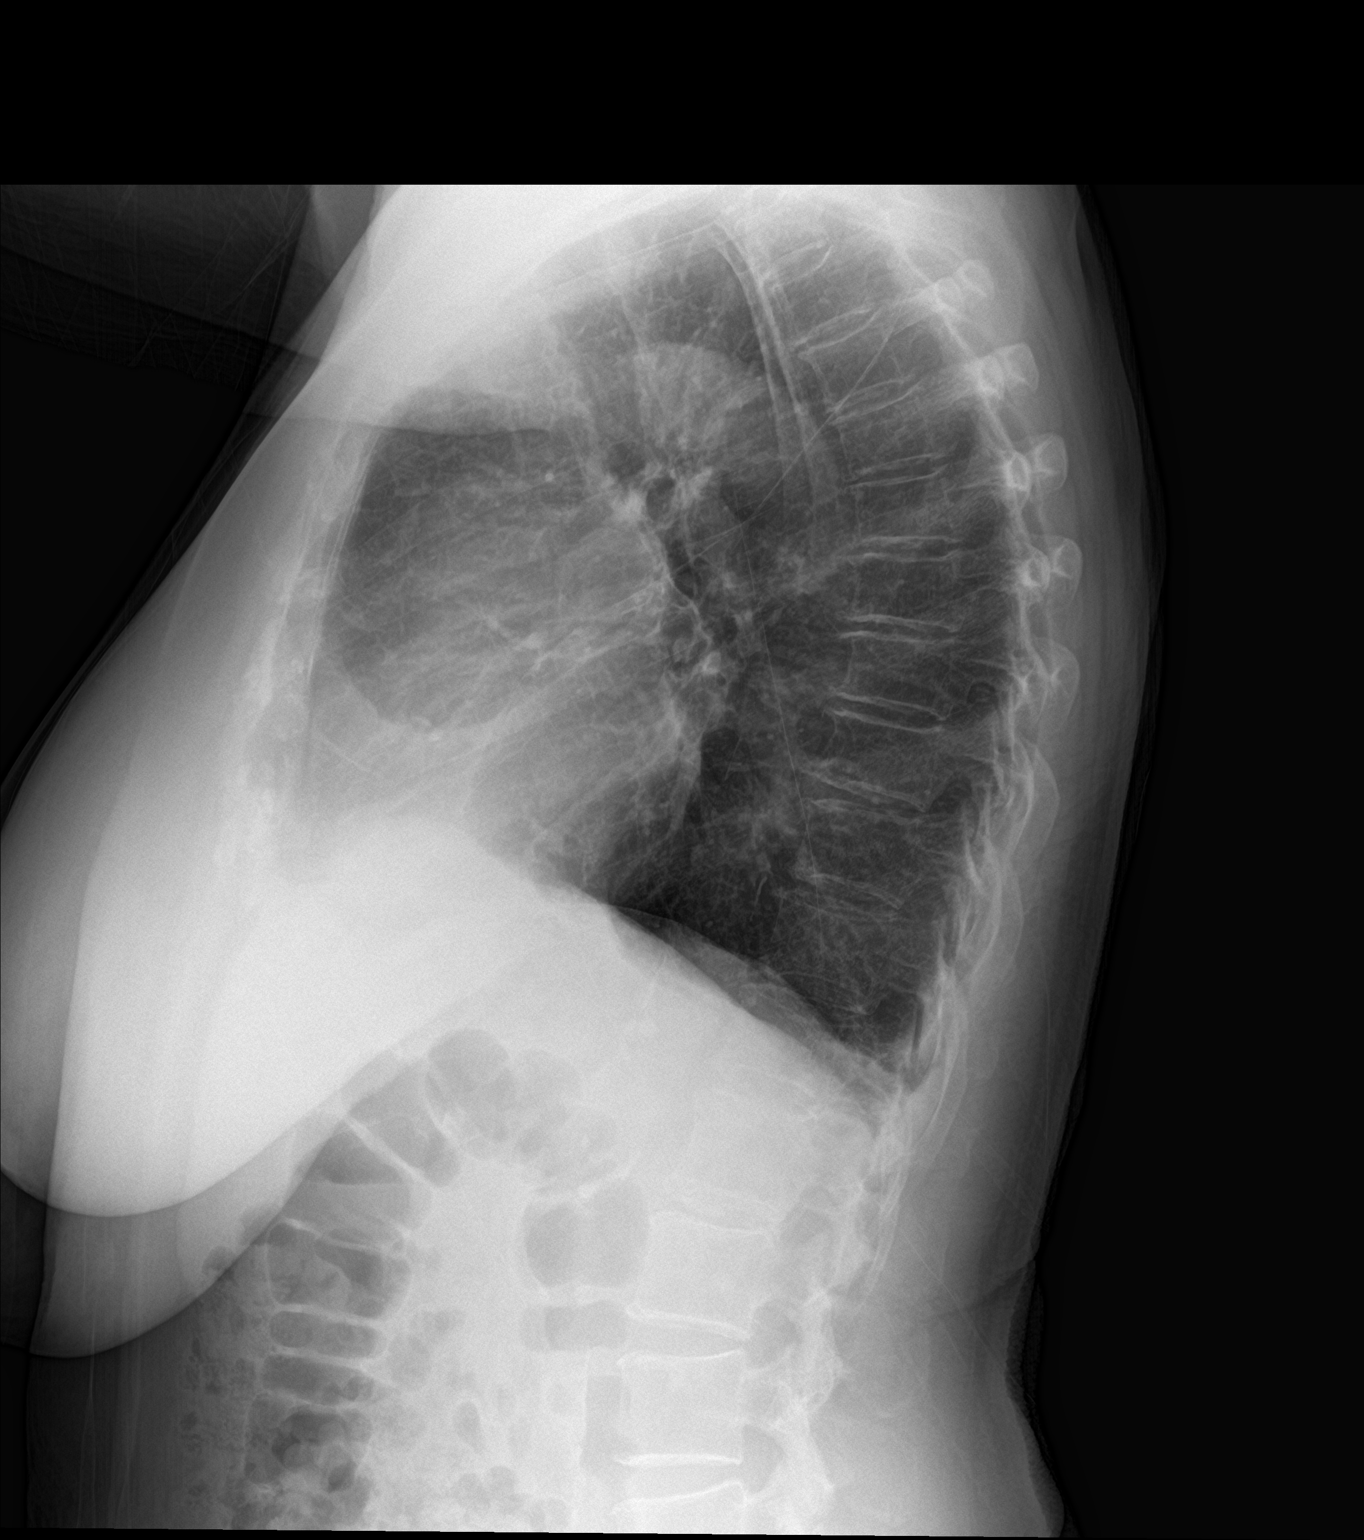

[2 of 2 positions shown; findings below may reference images not displayed]

FINDINGS: The heart size and mediastinal contours are within normal limits. No
focal pulmonary opacity. Mild blunting of the left costophrenic
angle. No pneumothorax. The visualized skeletal structures are
unremarkable.
IMPRESSION: Persistent blunting of the left costophrenic angle, which could
indicate pleural fluid or pleural thickening. No new pulmonary
opacity.

## 2021-04-20 NOTE — Addendum Note (Signed)
Addended by: Gwenevere Abbot on: 04/20/2021 04:38 PM   Modules accepted: Level of Service

## 2021-04-20 NOTE — Patient Instructions (Addendum)
Symptoms of fatigue, myalgias, arthralgias, sinus pressure, neck pain and feeling off balance 36month post COVID.  Patient is elderly and did receive antiviral medicine early on.  Also recently took azithromycin and chest x-ray was done.  Chest x-ray did not show pneumonia.   Patient notes symptoms did improve while on azithromycin.  Sinus pressure persist on exam.  Will prescribe doxycycline antibiotic for possible persisting sinus infection.  Rx advisement given.  Particularly instructed to have some food in stomach prior to taking tablets.  Avoid any calcium rich foods as her calcium can inhibit resorption of the antibiotic.  Will get CBC, CMP and inflammatory labs to evaluate post COVID.  Will also get cervical spine x-ray to assess to what degree degenerative changes may be present.  If signs and symptoms worsen or change notify us.  Any dramatic severe change in symptoms and recommend ED evaluation.  Overall diffuse weakness but symmetric. Ct imaging not indicated. Follow labs and if normal and weakness/fatigue persists refer to PT to help with post covid deconditioning. If any gross motor or sensory deficit occur then advise ED evluation  Follow-up in 7 to 10 days or sooner if needed.

## 2021-04-20 NOTE — Progress Notes (Addendum)
Subjective:    Patient ID: Leslie Ward, female    DOB: 1930/11/06, 87 y.o.   MRN: [id number]  HPI  Pt in for sinus pain. She also has neck pain and shoulder pain.Fatigue, some gait instablity and persisting cough since covid one month ago.   I have given pt azithromcyin on 04/09/2021. When pt was taking azithromycin she did feel overall better.   Pt chest xray on 04-05-2021 was negative for pneumonia.  She still has maxillary sinus pressure.  Son did test for covid post dx and test came back negative.   Review of Systems  Constitutional:  Negative for chills, fatigue and fever.  HENT:  Positive for congestion, sinus pressure and sinus pain. Negative for ear pain, facial swelling and postnasal drip.   Respiratory:  Positive for cough. Negative for chest tightness, shortness of breath and wheezing.        Minimal residual.  Cardiovascular:  Negative for chest pain and palpitations.  Gastrointestinal:  Negative for abdominal pain and blood in stool.  Genitourinary:  Negative for dysuria.  Musculoskeletal:  Negative for back pain and myalgias.  Skin:  Negative for rash.  Neurological:  Negative for dizziness, seizures, numbness and headaches.       At times feels off balance. But not gross motors/sensory fxn deftis.  Hematological:  Negative for adenopathy. Does not bruise/bleed easily.  Psychiatric/Behavioral:  Negative for behavioral problems, confusion and sleep disturbance. The patient is not nervous/anxious.     Past Medical History:  Diagnosis Date   Dystonia    Hypertension    Thyroid disease      Social History   Socioeconomic History   Marital status: Widowed    Spouse name: Not on file   Number of children: 1   Years of education: 6 or 7 years of education   Highest education level: Not on file  Occupational History   Occupation: Retired  Tobacco Use   Smoking status: Never   Smokeless tobacco: Never  Vaping Use   Vaping Use: Never used  Substance  and Sexual Activity   Alcohol use: No   Drug use: No   Sexual activity: Not on file  Other Topics Concern   Not on file  Social History Narrative   Lives alone.   Right-handed.   No caffeine use.   Social Determinants of Health   Financial Resource Strain: Not on file  Food Insecurity: Not on file  Transportation Needs: Not on file  Physical Activity: Not on file  Stress: Not on file  Social Connections: Not on file  Intimate Partner Violence: Not on file    Past Surgical History:  Procedure Laterality Date   NO PAST SURGERIES      Family History  Problem Relation Age of Onset   Stroke Mother    Heart attack Father    Heart Problems Brother     Allergies  Allergen Reactions   Crestor [Rosuvastatin Calcium] Other (See Comments)    "caused me to go into kidney failure"    Current Outpatient Medications on File Prior to Visit  Medication Sig Dispense Refill   amLODipine (NORVASC) 5 MG tablet TAKE 1 TABLET(5 MG) BY MOUTH TWICE DAILY 180 tablet 2   benzonatate (TESSALON) 100 MG capsule Take 1 capsule (100 mg total) by mouth 3 (three) times daily as needed for cough. 30 capsule 0   Calcium Carbonate (CALCIUM 600 PO) Take 2 tablets by mouth daily.     Calcium Polycarbophil (FIBER-CAPS PO)  Take 1 capsule by mouth 2 (two) times a day.      Cinnamon 500 MG capsule Take 1,000 mg by mouth daily.     fluticasone (FLONASE) 50 MCG/ACT nasal spray Place 2 sprays into both nostrils daily. 48 g 3   fluticasone (FLONASE) 50 MCG/ACT nasal spray Place 2 sprays into both nostrils daily. 16 g 1   gabapentin (NEURONTIN) 100 MG capsule Take 1 capsule (100 mg total) by mouth at bedtime. 30 capsule 2   incobotulinumtoxinA (XEOMIN) 100 units SOLR injection Inject 100 Units into the muscle every 3 (three) months.     levothyroxine (SYNTHROID) 50 MCG tablet TAKE 1 TABLET(50 MCG) BY MOUTH DAILY BEFORE AND BREAKFAST 90 tablet 0   meclizine (ANTIVERT) 12.5 MG tablet Take 1 tablet (12.5 mg total)  by mouth 3 (three) times daily as needed for dizziness. 30 tablet 0   MELATONIN PO Take 2-3 tablets by mouth at bedtime as needed (sleep).      Menaquinone-7 (VITAMIN K2 PO) Take 1 tablet by mouth at bedtime.      metoprolol tartrate (LOPRESSOR) 50 MG tablet Take 1 tablet (50 mg total) by mouth 2 (two) times daily. 180 tablet 1   Multiple Vitamin (MULTIVITAMIN) tablet Take 1 tablet by mouth daily.     Omega-3 Fatty Acids (OMEGA 3 PO) Take 2 tablets by mouth every evening.      Phytosterol Esters (CHOLEST CARE PO) Take 1,600 mg by mouth daily.     Probiotic Product (PROBIOTIC DAILY PO) Take 1 tablet by mouth daily.     telmisartan (MICARDIS) 20 MG tablet Take 1 tablet (20 mg total) by mouth daily. 90 tablet 3   vitamin C (ASCORBIC ACID) 500 MG tablet Take 500 mg by mouth every evening.      pravastatin (PRAVACHOL) 20 MG tablet Take 1 tablet (20 mg total) by mouth 2 (two) times a week. 30 tablet 3   Current Facility-Administered Medications on File Prior to Visit  Medication Dose Route Frequency Provider Last Rate Last Admin   incobotulinumtoxinA (XEOMIN) 100 units injection 100 Units  100 Units Intramuscular Q90 days Levert Feinstein, MD   100 Units at 04/19/20 1201   incobotulinumtoxinA (XEOMIN) 100 units injection 100 Units  100 Units Intramuscular Q90 days Levert Feinstein, MD   100 Units at 07/19/20 1405    BP (!) 150/60   Pulse 81   Temp 99 F (37.2 C)   Resp 18   Ht 4\' 9"  (1.448 m)   Wt 115 lb 3.2 oz (52.3 kg)   SpO2 95%   BMI 24.93 kg/m       Objective:   Physical Exam  General Mental Status- Alert. General Appearance- Not in acute distress.   Skin General: Color- Normal Color. Moisture- Normal Moisture.  Neck Carotid Arteries- Normal color. Moisture- Normal Moisture. No carotid bruits. No JVD.  Chest and Lung Exam Auscultation: Breath Sounds:-Normal.  Cardiovascular Auscultation:Rythm- Regular. Murmurs & Other Heart Sounds:Auscultation of the heart reveals- No  Murmurs.  Abdomen Inspection:-Inspeection Normal. Palpation/Percussion:Note:No mass. Palpation and Percussion of the abdomen reveal- Non Tender, Non Distended + BS, no rebound or guarding.    Neurologic Cranial Nerve exam:- CN III-XII intact(No nystagmus), symmetric smile. Strength:- 4/5(slight weakness)equal and symmetric strength both upper and lower extremities. Apperars weak overall. No deficit.  Can walk with no obvious instability.   Heent- maxillary sinus pressure to palpation. Neck- mid mid cervical spine tenderness. Trapezius tenderness to palpation including insertion site area into occipital region.  Assessment & Plan:    Patient Instructions  Symptoms of fatigue, myalgias, arthralgias, sinus pressure, neck pain and feeling off balance 43month post COVID.  Patient is elderly and did receive antiviral medicine early on.  Also recently took azithromycin and chest x-ray was done.  Chest x-ray did not show pneumonia.   Patient notes symptoms did improve while on azithromycin.  Sinus pressure persist on exam.  Will prescribe doxycycline antibiotic for possible persisting sinus infection.  Rx advisement given.  Particularly instructed to have some food in stomach prior to taking tablets.  Avoid any calcium rich foods as her calcium can inhibit resorption of the antibiotic.  Will get CBC, CMP and inflammatory labs to evaluate post COVID.  Will also get cervical spine x-ray to assess to what degree degenerative changes may be present.  If signs and symptoms worsen or change notify us.  Any dramatic severe change in symptoms and recommend ED evaluation.  Follow-up in 7 to 10 days or sooner if needed.       [name], PA-C    Time spent with patient today was  42 minutes which consisted of chart revdiew, discussing diagnosis, work up treatment and documentation. Closed note but on return from lab had further question. Answered those various question, counseled and  further documentated

## 2021-04-21 ENCOUNTER — Encounter: Payer: Self-pay | Admitting: Medical

## 2021-04-21 MED ORDER — METHYLPREDNISOLONE 4 MG PO TABS: ORAL_TABLET | ORAL | 0 refills | Status: DC

## 2021-04-21 NOTE — Addendum Note (Signed)
Addended by: Gwenevere Abbot on: 04/21/2021 12:10 PM   Modules accepted: Orders

## 2021-04-22 LAB — COMPREHENSIVE METABOLIC PANEL
AG Ratio: 1.1 (calc) (ref 1.0–2.5)
ALT: 15 U/L (ref 6–29)
AST: 14 U/L (ref 10–35)
Albumin: 3.5 g/dL — ABNORMAL LOW (ref 3.6–5.1)
Alkaline phosphatase (APISO): 77 U/L (ref 37–153)
BUN: 22 mg/dL (ref 7–25)
CO2: 27 mmol/L (ref 20–32)
Calcium: 9.6 mg/dL (ref 8.6–10.4)
Chloride: 98 mmol/L (ref 98–110)
Creat: 0.9 mg/dL (ref 0.60–0.95)
Globulin: 3.1 g/dL (calc) (ref 1.9–3.7)
Glucose, Bld: 99 mg/dL (ref 65–99)
Potassium: 4.2 mmol/L (ref 3.5–5.3)
Sodium: 135 mmol/L (ref 135–146)
Total Bilirubin: 0.7 mg/dL (ref 0.2–1.2)
Total Protein: 6.6 g/dL (ref 6.1–8.1)

## 2021-04-22 LAB — CBC WITH DIFFERENTIAL/PLATELET
Absolute Monocytes: 939 cells/uL (ref 200–950)
Basophils Absolute: 49 cells/uL (ref 0–200)
Basophils Relative: 0.4 %
Eosinophils Absolute: 85 cells/uL (ref 15–500)
Eosinophils Relative: 0.7 %
HCT: 33.9 % — ABNORMAL LOW (ref 35.0–45.0)
Hemoglobin: 11.2 g/dL — ABNORMAL LOW (ref 11.7–15.5)
Lymphs Abs: 4246 cells/uL — ABNORMAL HIGH (ref 850–3900)
MCH: 29.9 pg (ref 27.0–33.0)
MCHC: 33 g/dL (ref 32.0–36.0)
MCV: 90.4 fL (ref 80.0–100.0)
MPV: 9.1 fL (ref 7.5–12.5)
Monocytes Relative: 7.7 %
Neutro Abs: 6881 cells/uL (ref 1500–7800)
Neutrophils Relative %: 56.4 %
Platelets: 493 10*3/uL — ABNORMAL HIGH (ref 140–400)
RBC: 3.75 10*6/uL — ABNORMAL LOW (ref 3.80–5.10)
RDW: 13.1 % (ref 11.0–15.0)
Total Lymphocyte: 34.8 %
WBC: 12.2 10*3/uL — ABNORMAL HIGH (ref 3.8–10.8)

## 2021-04-22 LAB — ANA: Anti Nuclear Antibody (ANA): NEGATIVE

## 2021-04-22 LAB — RHEUMATOID FACTOR: Rheumatoid fact SerPl-aCnc: <14 IU/mL (ref <14)

## 2021-04-22 LAB — SEDIMENTATION RATE: Sed Rate: >130 mm/h — ABNORMAL HIGH (ref 0–30)

## 2021-04-22 LAB — C-REACTIVE PROTEIN: CRP: 152.1 mg/L — ABNORMAL HIGH (ref <8.0)

## 2021-04-23 MED ORDER — DOXYCYCLINE HYCLATE 100 MG PO TABS: 100.0000 mg | ORAL_TABLET | Freq: Two times a day (BID) | ORAL | 0 refills | Status: DC

## 2021-04-23 NOTE — Addendum Note (Signed)
Addended by: Gwenevere Abbot on: 04/23/2021 09:09 AM   Modules accepted: Orders

## 2021-04-24 ENCOUNTER — Telehealth: Payer: Self-pay | Admitting: Medical

## 2021-04-24 ENCOUNTER — Encounter: Payer: Self-pay | Admitting: Medical

## 2021-04-24 NOTE — Addendum Note (Signed)
Addended by: Gwenevere Abbot on: 04/24/2021 06:05 PM   Modules accepted: Orders

## 2021-04-24 NOTE — Telephone Encounter (Signed)
Dr. Abner Greenspan,  I sent you last note on this pt who had covid about one month ago. Some persisting signs and symptoms. Wbc mild elevated at around 12. Pt sed rate greater than 130 and c reactive protein 152.  She is on doxycyline for possible persisting sinus infection. Elderly and frail did add on brief very low taper medrol after saw c reactive protein. I am going to repeat labs on Friday.  I have seen inflammatory markers up post covid but this would be highest have seen.   Wanted to get your opinion.   Thanks,  Ramon Dredge

## 2021-04-26 ENCOUNTER — Ambulatory Visit: Payer: Medicare HMO | Admitting: Medical

## 2021-04-26 NOTE — Telephone Encounter (Signed)
error 

## 2021-04-27 ENCOUNTER — Ambulatory Visit: Payer: Medicare HMO | Admitting: Medical

## 2021-04-27 ENCOUNTER — Other Ambulatory Visit: Payer: Self-pay

## 2021-04-27 ENCOUNTER — Encounter: Payer: Self-pay | Admitting: Medical

## 2021-04-27 ENCOUNTER — Ambulatory Visit (INDEPENDENT_AMBULATORY_CARE_PROVIDER_SITE_OTHER): Payer: Medicare HMO | Admitting: Medical

## 2021-04-27 VITALS — BP 130/70 | HR 88 | Temp 97.9°F | Resp 18 | Ht <= 58 in | Wt 113.6 lb

## 2021-04-27 DIAGNOSIS — Z8616 Personal history of COVID-19: Secondary | ICD-10-CM

## 2021-04-27 DIAGNOSIS — M791 Myalgia, unspecified site: Secondary | ICD-10-CM | POA: Diagnosis not present

## 2021-04-27 DIAGNOSIS — D72829 Elevated white blood cell count, unspecified: Secondary | ICD-10-CM | POA: Diagnosis not present

## 2021-04-27 DIAGNOSIS — H9193 Unspecified hearing loss, bilateral: Secondary | ICD-10-CM | POA: Diagnosis not present

## 2021-04-27 DIAGNOSIS — I10 Essential (primary) hypertension: Secondary | ICD-10-CM | POA: Diagnosis not present

## 2021-04-27 DIAGNOSIS — H814 Vertigo of central origin: Secondary | ICD-10-CM

## 2021-04-27 DIAGNOSIS — R700 Elevated erythrocyte sedimentation rate: Secondary | ICD-10-CM

## 2021-04-27 DIAGNOSIS — J0100 Acute maxillary sinusitis, unspecified: Secondary | ICD-10-CM | POA: Diagnosis not present

## 2021-04-27 NOTE — Patient Instructions (Addendum)
History of recent dizziness and feeling off balance since COVID.  Some improved symptoms but still reporting dizziness present some.  With severe sed rate inflammation and C-reactive inflammation  would like to go ahead and order CT of the head without contrast.  Hopefully this can be prior authorized and study could be done today or on Monday.  If you have worse dizziness, headache or motor/sensory deficits then recommend ED evaluation.  Severe myalgias and neck pain that seem to correlate with increased inflammation markers.  Was on low-dose Medrol and symptoms markedly improved.  Recent persisting sinus pressure despite use of azithromycin.  Now since using doxycycline you report improvement of sinus pressure.  Continue doxycycline antibiotic.  History of hypertension with some moderate elevated levels recently but today blood pressure check manually by myself it was 130/70.  Check blood pressures daily.  If persistent elevation then consider increasing dose of amlodipine or telmisartan.  Recent decreased hearing loss both sides but worse on right side.  Placed referral to audiologist.  Follow-up date to be determined after repeat C-reactive and sed rate results.

## 2021-04-27 NOTE — Progress Notes (Signed)
Subjective:    Patient ID: Leslie Ward, female    DOB: May 16, 1931, 87 y.o.   MRN: [id number]  HPI  Pt in states feeling better.   She feels more energy, less nasal congestion, sinus pressure and body aches.  Pt has been on medrol and doxycycline. She had impressive c reactive and sed rate elevation. Pt is on aspirin and now mulitivamin. She was already on fish oil. Also on b1 vitamin level.  Pt states her gate is more stable. Better coordination. However still brings up feeling some dizziness though admits less than before. However also reporting shaking head sensation at times.  Pt states past weeks her hearing has been worse.      Review of Systems  Constitutional:  Negative for chills, fatigue and fever.       Much better energy  Respiratory:  Negative for cough, choking, chest tightness, shortness of breath and wheezing.   Cardiovascular:  Negative for chest pain and palpitations.  Gastrointestinal:  Negative for abdominal pain and constipation.  Musculoskeletal:  Negative for back pain, gait problem, myalgias and neck stiffness.  Neurological:  Positive for dizziness. Negative for speech difficulty, weakness, numbness and headaches.       Pt states when had covid head felt shaky trembled but now better. Son states got botox injections for trembling.    Past Medical History:  Diagnosis Date   Dystonia    Hypertension    Thyroid disease      Social History   Socioeconomic History   Marital status: Widowed    Spouse name: Not on file   Number of children: 1   Years of education: 6 or 7 years of education   Highest education level: Not on file  Occupational History   Occupation: Retired  Tobacco Use   Smoking status: Never   Smokeless tobacco: Never  Vaping Use   Vaping Use: Never used  Substance and Sexual Activity   Alcohol use: No   Drug use: No   Sexual activity: Not on file  Other Topics Concern   Not on file  Social History Narrative   Lives  alone.   Right-handed.   No caffeine use.   Social Determinants of Health   Financial Resource Strain: Not on file  Food Insecurity: Not on file  Transportation Needs: Not on file  Physical Activity: Not on file  Stress: Not on file  Social Connections: Not on file  Intimate Partner Violence: Not on file    Past Surgical History:  Procedure Laterality Date   NO PAST SURGERIES      Family History  Problem Relation Age of Onset   Stroke Mother    Heart attack Father    Heart Problems Brother     Allergies  Allergen Reactions   Crestor [Rosuvastatin Calcium] Other (See Comments)    "caused me to go into kidney failure"    Current Outpatient Medications on File Prior to Visit  Medication Sig Dispense Refill   amLODipine (NORVASC) 5 MG tablet TAKE 1 TABLET(5 MG) BY MOUTH TWICE DAILY 180 tablet 2   benzonatate (TESSALON) 100 MG capsule Take 1 capsule (100 mg total) by mouth 3 (three) times daily as needed for cough. 30 capsule 0   Calcium Carbonate (CALCIUM 600 PO) Take 2 tablets by mouth daily.     Calcium Polycarbophil (FIBER-CAPS PO) Take 1 capsule by mouth 2 (two) times a day.      Cinnamon 500 MG capsule Take 1,000 mg by  mouth daily.     doxycycline (VIBRA-TABS) 100 MG tablet Take 1 tablet (100 mg total) by mouth 2 (two) times daily. Can give caps or generic 20 tablet 0   fluticasone (FLONASE) 50 MCG/ACT nasal spray Place 2 sprays into both nostrils daily. 48 g 3   fluticasone (FLONASE) 50 MCG/ACT nasal spray Place 2 sprays into both nostrils daily. 16 g 1   gabapentin (NEURONTIN) 100 MG capsule Take 1 capsule (100 mg total) by mouth at bedtime. 30 capsule 2   incobotulinumtoxinA (XEOMIN) 100 units SOLR injection Inject 100 Units into the muscle every 3 (three) months.     levothyroxine (SYNTHROID) 50 MCG tablet TAKE 1 TABLET(50 MCG) BY MOUTH DAILY BEFORE AND BREAKFAST 90 tablet 0   meclizine (ANTIVERT) 12.5 MG tablet Take 1 tablet (12.5 mg total) by mouth 3 (three) times  daily as needed for dizziness. 30 tablet 0   MELATONIN PO Take 2-3 tablets by mouth at bedtime as needed (sleep).      Menaquinone-7 (VITAMIN K2 PO) Take 1 tablet by mouth at bedtime.      methylPREDNISolone (MEDROL) 4 MG tablet 4 tab po day 1, 3 tab po day 2, 2 tab po day 3 and 1 tab po day 4. 10 tablet 0   metoprolol tartrate (LOPRESSOR) 50 MG tablet Take 1 tablet (50 mg total) by mouth 2 (two) times daily. 180 tablet 1   Multiple Vitamin (MULTIVITAMIN) tablet Take 1 tablet by mouth daily.     Omega-3 Fatty Acids (OMEGA 3 PO) Take 2 tablets by mouth every evening.      Phytosterol Esters (CHOLEST CARE PO) Take 1,600 mg by mouth daily.     pravastatin (PRAVACHOL) 20 MG tablet Take 1 tablet (20 mg total) by mouth 2 (two) times a week. 30 tablet 3   Probiotic Product (PROBIOTIC DAILY PO) Take 1 tablet by mouth daily.     telmisartan (MICARDIS) 20 MG tablet Take 1 tablet (20 mg total) by mouth daily. 90 tablet 3   vitamin C (ASCORBIC ACID) 500 MG tablet Take 500 mg by mouth every evening.      Current Facility-Administered Medications on File Prior to Visit  Medication Dose Route Frequency Provider Last Rate Last Admin   incobotulinumtoxinA (XEOMIN) 100 units injection 100 Units  100 Units Intramuscular Q90 days Levert Feinstein, MD   100 Units at 04/19/20 1201   incobotulinumtoxinA (XEOMIN) 100 units injection 100 Units  100 Units Intramuscular Q90 days Levert Feinstein, MD   100 Units at 07/19/20 1405    BP 140/60   Pulse 88   Temp 97.9 F (36.6 C)   Resp 18   Ht 4\' 9"  (1.448 m)   Wt 113 lb 9.6 oz (51.5 kg)   SpO2 96%   BMI 24.58 kg/m       Objective:   Physical Exam  General Mental Status- Alert. General Appearance- Not in acute distress.   Skin General: Color- Normal Color. Moisture- Normal Moisture.  Neck Carotid Arteries- Normal color. Moisture- Normal Moisture. No carotid bruits. No JVD.  Chest and Lung Exam Auscultation: Breath  Sounds:-Normal.  Cardiovascular Auscultation:Rythm- Regular. Murmurs & Other Heart Sounds:Auscultation of the heart reveals- No Murmurs.  Abdomen Inspection:-Inspeection Normal. Palpation/Percussion:Note:No mass. Palpation and Percussion of the abdomen reveal- Non Tender, Non Distended + BS, no rebound or guarding.  Neurologic Cranial Nerve exam:- CN III-XII intact(No nystagmus), symmetric smile. Finger to Nose:- Normal/Intact Strength:- 5/5 equal and symmetric strength both upper and lower extremities.  Assessment & Plan:   Patient Instructions  History of recent dizziness and feeling off balance since COVID.  Some improved symptoms but still reporting dizziness present some.  With severe sed rate inflammation and C-reactive inflammation  would like to go ahead and order CT of the head without contrast.  Hopefully this can be prior authorized and study could be done today or on Monday.  If you have worse dizziness, headache or motor/sensory deficits then recommend ED evaluation.  Severe myalgias and neck pain that seem to correlate with increased inflammation markers.  Was on low-dose Medrol and symptoms markedly improved.  Recent persisting sinus pressure despite use of azithromycin.  Now since using doxycycline you report improvement of sinus pressure.  Continue doxycycline antibiotic.  History of hypertension with some moderate elevated levels recently but today blood pressure check manually by myself it was 130/70.  Check blood pressures daily.  If persistent elevation then consider increasing dose of amlodipine or telmisartan.  Recent decreased hearing loss both sides but worse on right side.  Placed referral to audiologist.  Follow-up date to be determined after repeat C-reactive and sed rate results.   [name], PA-C   Time spent was  43 minutes which consisted of chart review, discussing diagnosis, work up ,treatment , answering pt/family questions and  documentation.

## 2021-04-28 ENCOUNTER — Encounter: Payer: Self-pay | Admitting: Medical

## 2021-04-28 LAB — CBC WITH DIFFERENTIAL/PLATELET
Absolute Monocytes: 926 cells/uL (ref 200–950)
Basophils Absolute: 63 cells/uL (ref 0–200)
Basophils Relative: 0.4 %
Eosinophils Absolute: 63 cells/uL (ref 15–500)
Eosinophils Relative: 0.4 %
HCT: 38.2 % (ref 35.0–45.0)
Hemoglobin: 12.4 g/dL (ref 11.7–15.5)
Lymphs Abs: 5683 cells/uL — ABNORMAL HIGH (ref 850–3900)
MCH: 29.9 pg (ref 27.0–33.0)
MCHC: 32.5 g/dL (ref 32.0–36.0)
MCV: 92 fL (ref 80.0–100.0)
MPV: 9.2 fL (ref 7.5–12.5)
Monocytes Relative: 5.9 %
Neutro Abs: 8965 cells/uL — ABNORMAL HIGH (ref 1500–7800)
Neutrophils Relative %: 57.1 %
Platelets: 536 10*3/uL — ABNORMAL HIGH (ref 140–400)
RBC: 4.15 10*6/uL (ref 3.80–5.10)
RDW: 13.1 % (ref 11.0–15.0)
Total Lymphocyte: 36.2 %
WBC: 15.7 10*3/uL — ABNORMAL HIGH (ref 3.8–10.8)

## 2021-04-28 LAB — SEDIMENTATION RATE: Sed Rate: 116 mm/h — ABNORMAL HIGH (ref 0–30)

## 2021-04-28 LAB — C-REACTIVE PROTEIN: CRP: 28.2 mg/L — ABNORMAL HIGH (ref <8.0)

## 2021-04-30 ENCOUNTER — Telehealth: Payer: Self-pay | Admitting: Medical

## 2021-04-30 NOTE — Telephone Encounter (Signed)
Ct head ordered on Friday. Will you get the prior authorization.

## 2021-04-30 NOTE — Telephone Encounter (Signed)
Alger Simons is calling from [facility] wanting a pre-authorization for the patient's ct scan tomorrow. She can be reached at [phone] ext. [phone]

## 2021-05-01 ENCOUNTER — Ambulatory Visit (HOSPITAL_BASED_OUTPATIENT_CLINIC_OR_DEPARTMENT_OTHER): Payer: Medicare HMO

## 2021-05-01 DIAGNOSIS — H35363 Drusen (degenerative) of macula, bilateral: Secondary | ICD-10-CM | POA: Diagnosis not present

## 2021-05-01 DIAGNOSIS — H401134 Primary open-angle glaucoma, bilateral, indeterminate stage: Secondary | ICD-10-CM | POA: Diagnosis not present

## 2021-05-01 DIAGNOSIS — H353131 Nonexudative age-related macular degeneration, bilateral, early dry stage: Secondary | ICD-10-CM | POA: Diagnosis not present

## 2021-05-01 NOTE — Telephone Encounter (Signed)
Fwd pre-auth to referral coordinators

## 2021-05-04 ENCOUNTER — Other Ambulatory Visit: Payer: Self-pay

## 2021-05-04 ENCOUNTER — Ambulatory Visit (HOSPITAL_BASED_OUTPATIENT_CLINIC_OR_DEPARTMENT_OTHER)
Admission: RE | Admit: 2021-05-04 | Discharge: 2021-05-04 | Disposition: A | Discharge status: Home / Self Care | Payer: Medicare HMO | Source: Ambulatory Visit | Attending: Medical | Admitting: Medical

## 2021-05-04 DIAGNOSIS — R42 Dizziness and giddiness: Secondary | ICD-10-CM | POA: Diagnosis not present

## 2021-05-04 DIAGNOSIS — R9082 White matter disease, unspecified: Secondary | ICD-10-CM | POA: Diagnosis not present

## 2021-05-04 DIAGNOSIS — H814 Vertigo of central origin: Secondary | ICD-10-CM | POA: Diagnosis not present

## 2021-05-04 IMAGING — CT CT HEAD W/O CM
3 series · 16 of 47 positions shown, 19 images · non-contrast
Comparison: [DATE].

CLINICAL DATA: Dizziness, blurry vision.  Fall 2 months ago.

EXAM:
CT HEAD WITHOUT CONTRAST
TECHNIQUE: Contiguous axial images were obtained from the base of the skull
through the vertex without intravenous contrast.

[Series 3: head wo · axial · 0.43mm/px · z∈[+976,+1106]mm · 10 of 32 slices shown, 13 images]
[im 3/32  brain]
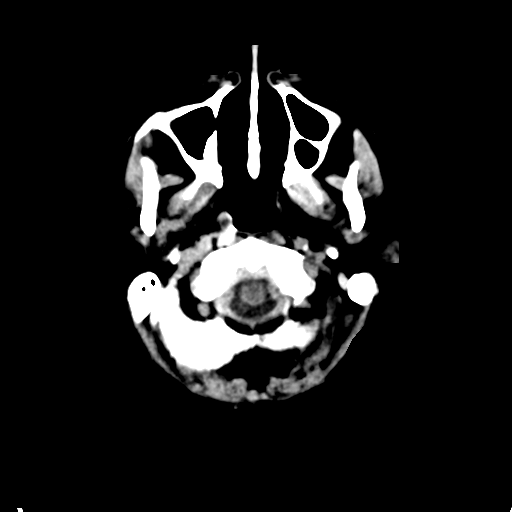
[im 3/32  bone]
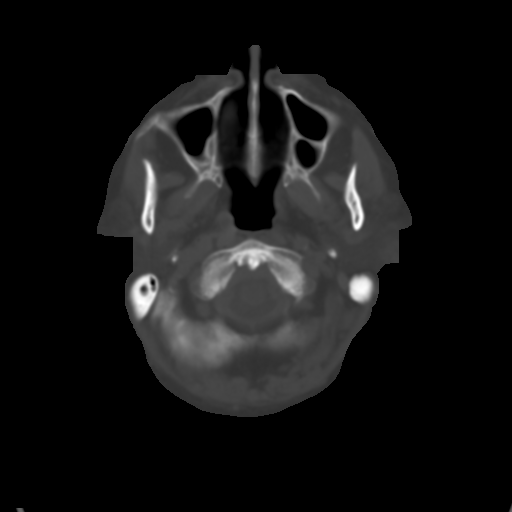
[im 6/32  brain]
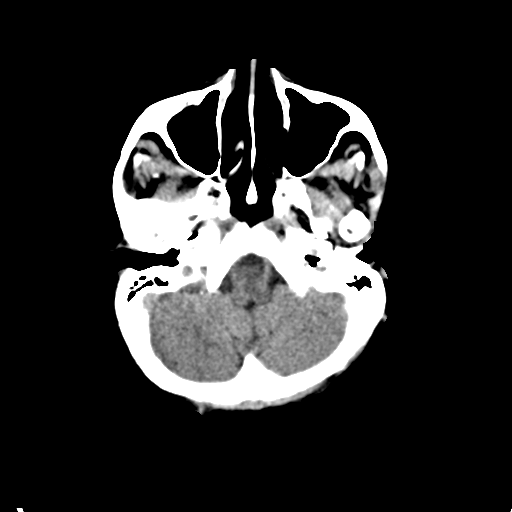
[im 9/32  brain]
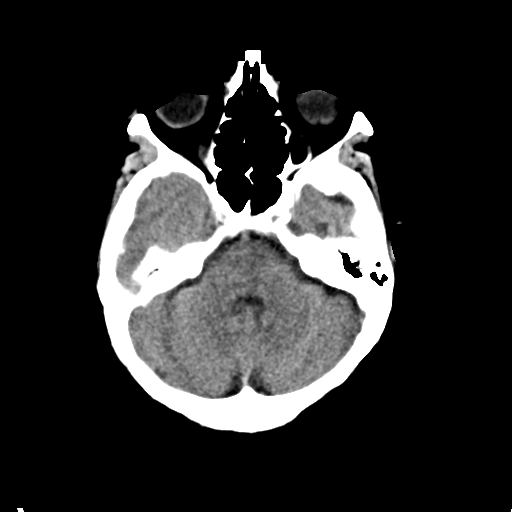
[im 11/32  brain]
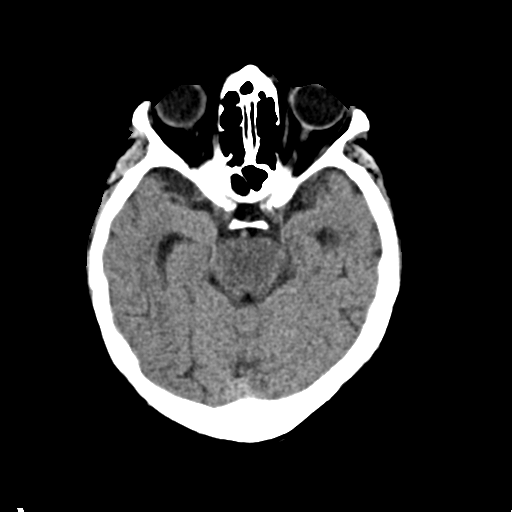
[im 14/32  brain]
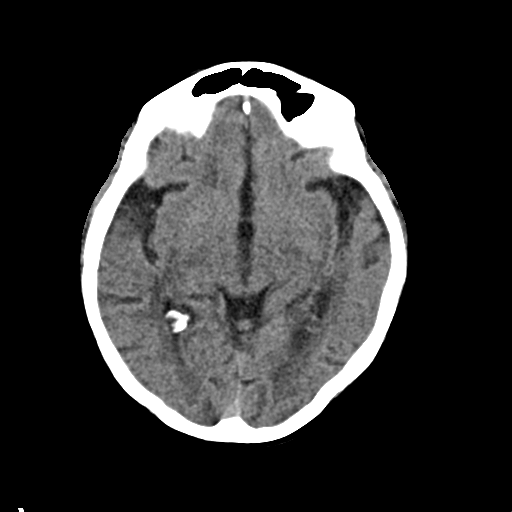
[im 14/32  bone]
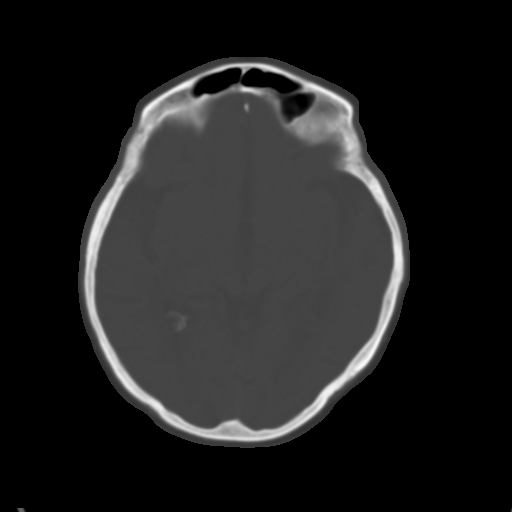
[im 18/32  brain]
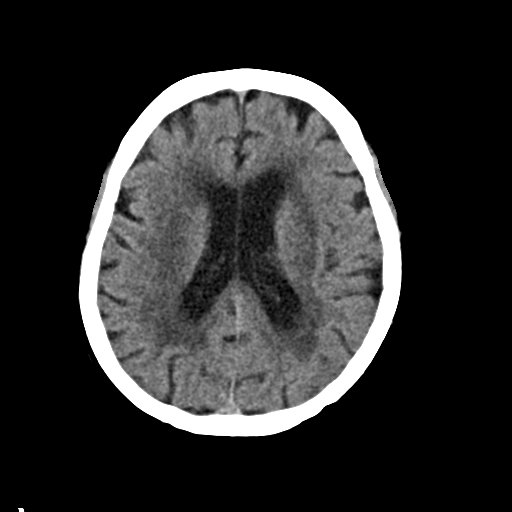
[im 21/32  brain]
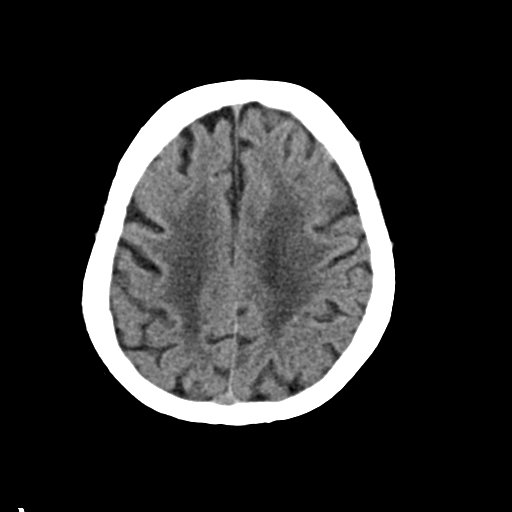
[im 24/32  brain]
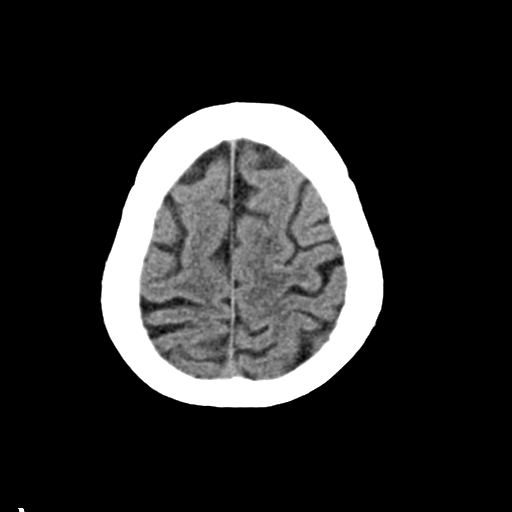
[im 26/32  brain]
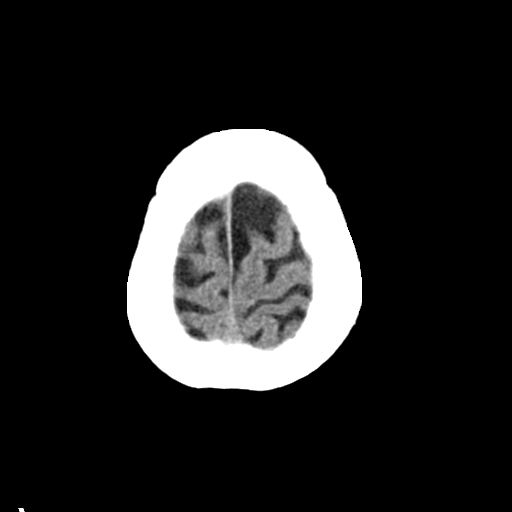
[im 26/32  bone]
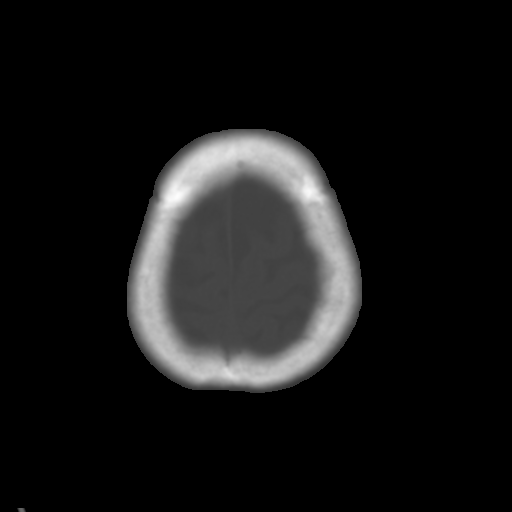
[im 29/32  brain]
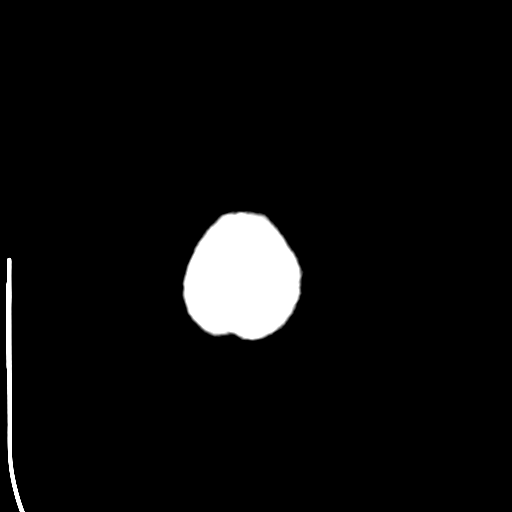

[Series 4: coronal soft · coronal · 0.31mm/px · 3 of 61 slices shown]
[im 21/61  brain]
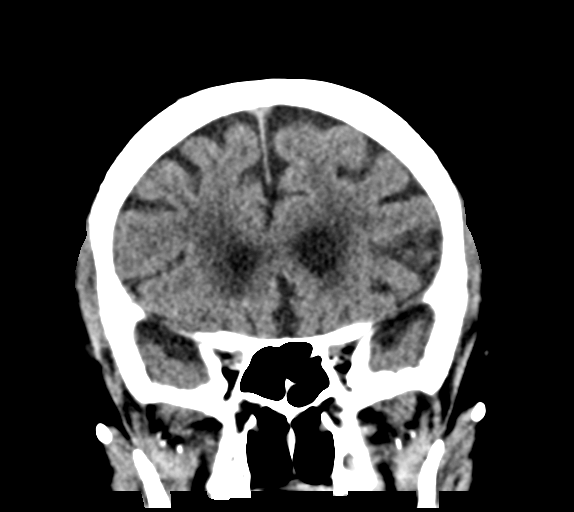
[im 27/61  brain]
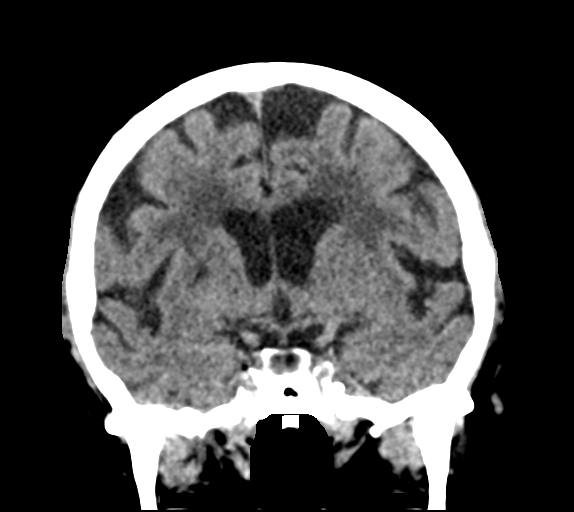
[im 34/61  brain]
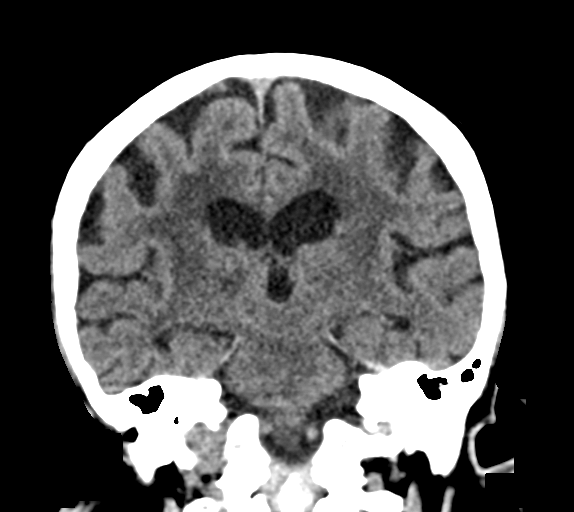

[Series 6: sag soft · sagittal · 0.31mm/px · 3 of 56 slices shown]
[im 19/56  brain]
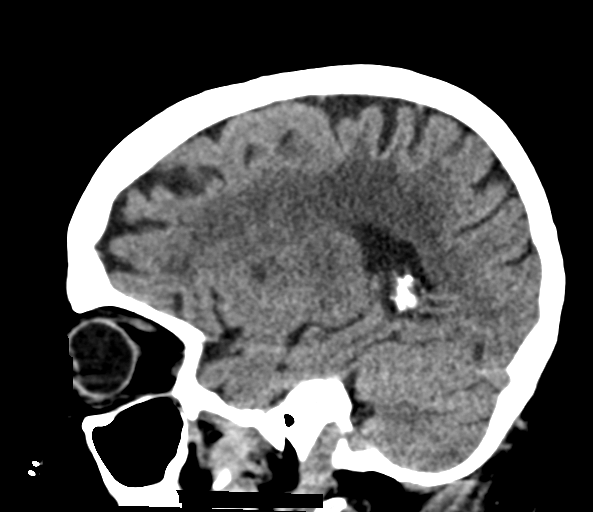
[im 28/56  brain]
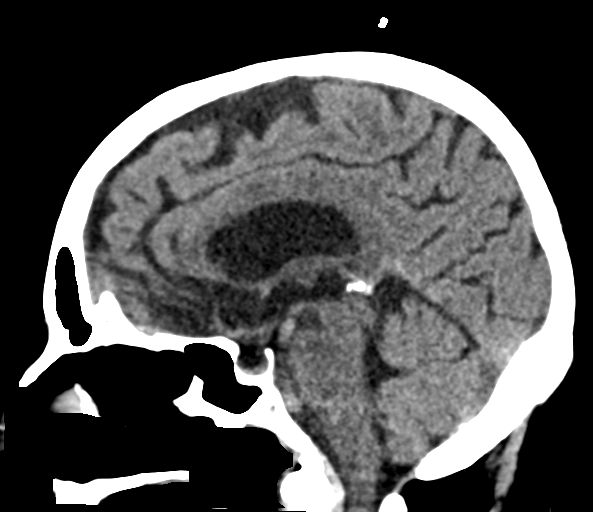
[im 37/56  brain]
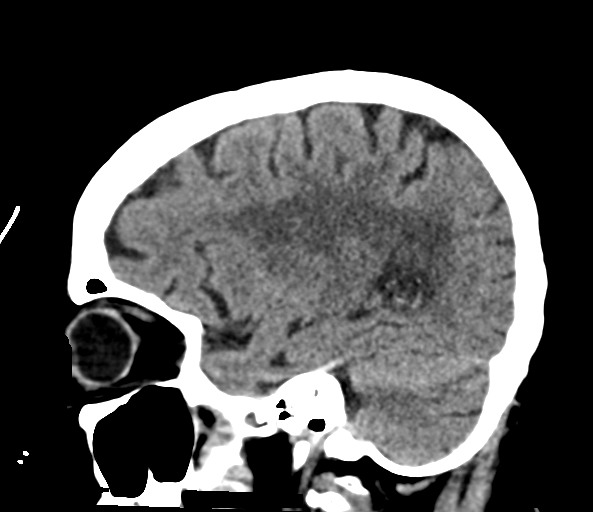

[16 of 47 positions shown; findings below may reference images not displayed]

FINDINGS: Brain: Mild chronic ischemic white matter disease is noted. No mass
effect or midline shift is noted. Ventricular size is within normal
limits. There is no evidence of mass lesion, hemorrhage or acute
infarction.

Vascular: No hyperdense vessel or unexpected calcification.

Skull: Normal. Negative for fracture or focal lesion.

Sinuses/Orbits: No acute finding.

Other: None.
IMPRESSION: No acute intracranial abnormality seen.

## 2021-05-08 ENCOUNTER — Other Ambulatory Visit: Payer: Self-pay | Admitting: Medical

## 2021-05-10 ENCOUNTER — Ambulatory Visit: Payer: Medicare HMO | Attending: Family Medicine | Admitting: Audiology

## 2021-05-10 ENCOUNTER — Other Ambulatory Visit: Payer: Self-pay

## 2021-05-10 DIAGNOSIS — H903 Sensorineural hearing loss, bilateral: Secondary | ICD-10-CM | POA: Insufficient documentation

## 2021-05-10 NOTE — Procedures (Signed)
  Outpatient Audiology and [facility] [address] [place], [place]  [address] [phone]  AUDIOLOGICAL  EVALUATION  NAME: Leslie Ward     DOB:   [birth year]      MRN: [id number]                                                                                     DATE: 05/10/2021     REFERENT: [name], PA-C STATUS: Outpatient DIAGNOSIS: Sensorineural hearing loss, bilateral   History: Leslie Ward was seen for an audiological evaluation due to decreased hearing occurring for two months. She was accompanied to the appointment by her Ward. Leslie Ward reports Leslie Ward had COVID in the beginning of August and has since had a significant decrease in her hearing sensitivity since August. Leslie Ward denies hearing concerns prior to having COVID. She denies otalgia, aural fullness, and tinnitus. Leslie Ward reports a history of dizziness. Leslie Ward reports increased difficulty hearing the television. Natalie's Ward reports Leslie Ward has had increased difficulty in hearing and communicating with her family. Leslie Ward's primary language is not [place] and her Ward helped with translation during the appointment.   Evaluation:  Otoscopy showed a clear view of the tympanic membranes, bilaterally Tympanometry results were consistent with normal middle ear pressure and normal tympanic membrane mobility in the right ear and negative middle ear pressure and normal tympanic membrane mobility in the left ear.  Audiometric testing was completed using Conventional Audiometry techniques with insert earphones and TDH headphones. Test results are consistent with a moderate to severe sensorineural hearing loss, bilaterally. Sensorineural asymmetry noted at 6000 Hz, worse in the left ear. Speech Recognition Thresholds were obtained at 40 dB HL in the right ear and at 45  dB HL in the left ear. Word Recognition Testing was completed at 80 dB HL and Leslie Ward scored 56% in the right ear and 68% in the left ear. English is not Leslie Ward's  primary language and should be taken in account for word recognition testing.    Results:  Today's test results are consistent with a moderate to severe sensorineural hearing loss, bilaterally. Sensorineural asymmetry noted at 6000 Hz, worse in the left ear. Leslie Ward will have communication difficulty in all listening environments. She will benefit from the use of amplification, assistive listening devices, and good communication strategies. Leslie Ward and her Ward were given a copy of the audiogram and Brewing technologist in the [place] area. The test results were reviewed with Leslie Ward and her Ward.   Recommendations: 1.   Communication Needs Assessment with an Audiologist to discuss amplification and assistive listening devices.  2.   Monitor hearing as needed.       If you have any questions please feel free to contact me at [phone].  Marton Redwood Audiologist, Au.D., CCC-A 05/10/2021  5:07 PM  Cc: [name], PA-C

## 2021-05-14 DIAGNOSIS — H903 Sensorineural hearing loss, bilateral: Secondary | ICD-10-CM | POA: Diagnosis not present

## 2021-05-18 ENCOUNTER — Other Ambulatory Visit: Payer: Self-pay

## 2021-05-18 ENCOUNTER — Encounter: Payer: Self-pay | Admitting: Internal Medicine

## 2021-05-18 ENCOUNTER — Ambulatory Visit (INDEPENDENT_AMBULATORY_CARE_PROVIDER_SITE_OTHER): Payer: Medicare HMO | Admitting: Internal Medicine

## 2021-05-18 ENCOUNTER — Ambulatory Visit (HOSPITAL_BASED_OUTPATIENT_CLINIC_OR_DEPARTMENT_OTHER)
Admission: RE | Admit: 2021-05-18 | Discharge: 2021-05-18 | Disposition: A | Discharge status: Home / Self Care | Payer: Medicare HMO | Source: Ambulatory Visit | Attending: Internal Medicine | Admitting: Internal Medicine

## 2021-05-18 VITALS — BP 126/74 | HR 97 | Temp 97.9°F | Resp 16 | Ht <= 58 in | Wt 117.1 lb

## 2021-05-18 DIAGNOSIS — M545 Low back pain, unspecified: Secondary | ICD-10-CM

## 2021-05-18 DIAGNOSIS — E119 Type 2 diabetes mellitus without complications: Secondary | ICD-10-CM

## 2021-05-18 DIAGNOSIS — G9331 Postviral fatigue syndrome: Secondary | ICD-10-CM | POA: Diagnosis not present

## 2021-05-18 DIAGNOSIS — I10 Essential (primary) hypertension: Secondary | ICD-10-CM

## 2021-05-18 DIAGNOSIS — E039 Hypothyroidism, unspecified: Secondary | ICD-10-CM | POA: Diagnosis not present

## 2021-05-18 DIAGNOSIS — U099 Post covid-19 condition, unspecified: Secondary | ICD-10-CM

## 2021-05-18 DIAGNOSIS — R5383 Other fatigue: Secondary | ICD-10-CM | POA: Diagnosis not present

## 2021-05-18 IMAGING — DX DG CHEST 2V
2 series · 2 of 2 positions shown · non-contrast
Comparison: [DATE]

CLINICAL DATA: Fatigue

EXAM:
CHEST - 2 VIEW

[chest pa]
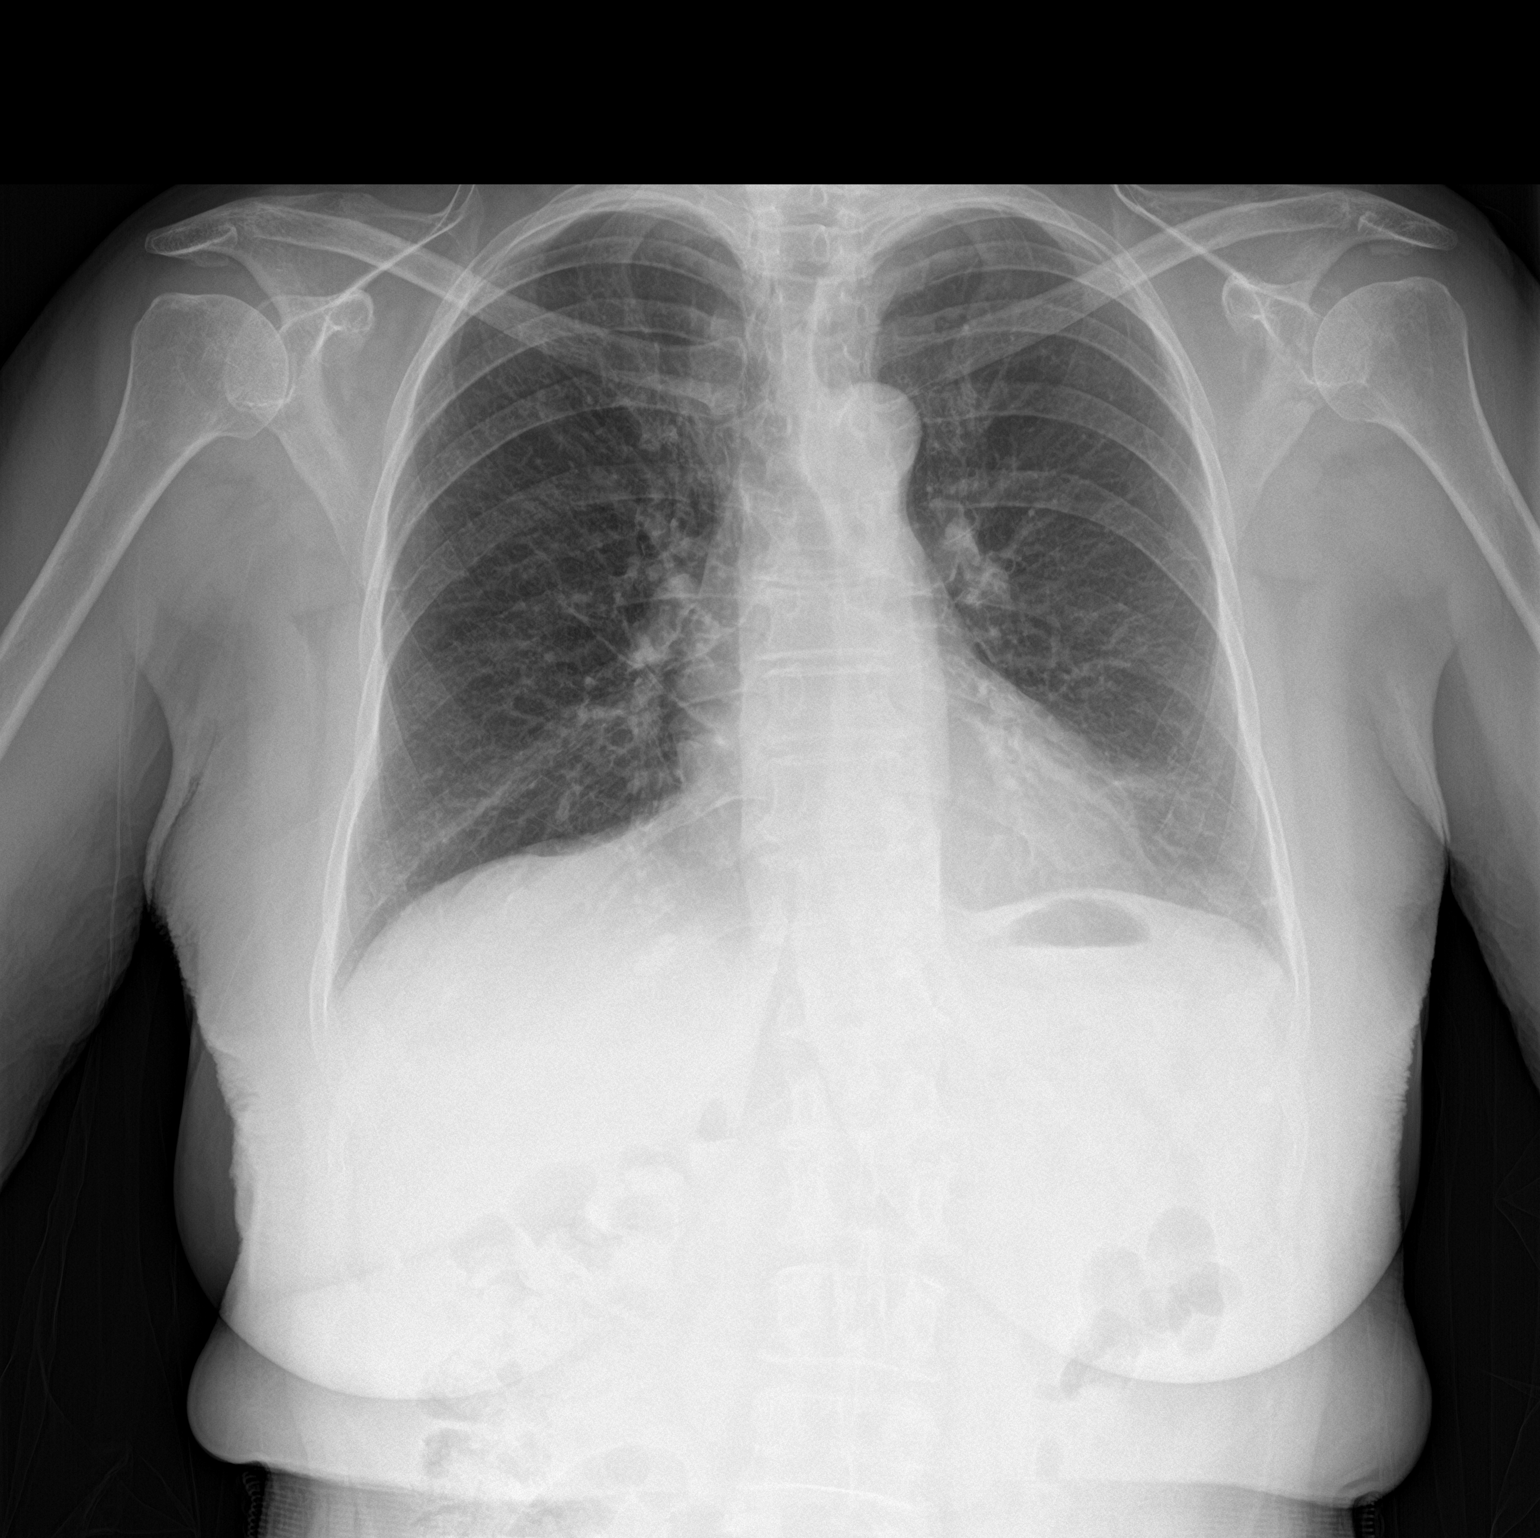

[chest lat]
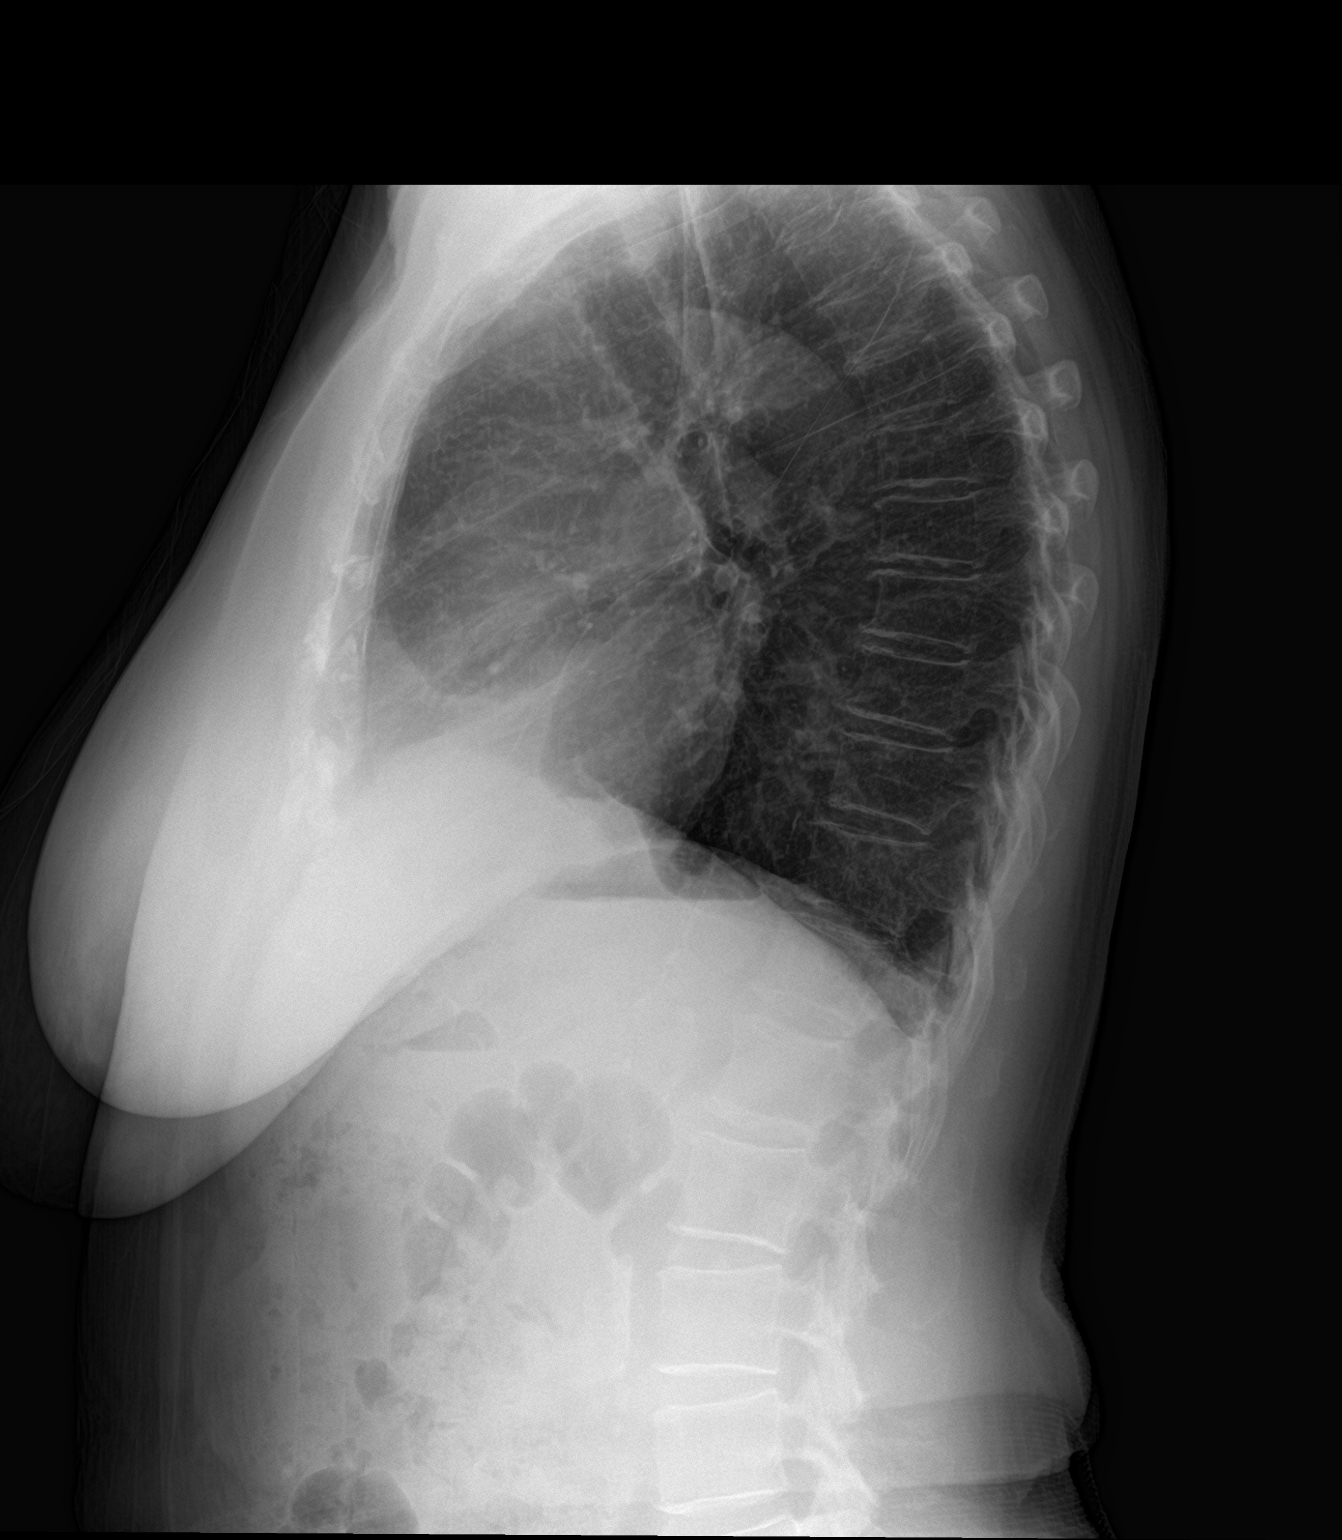

[2 of 2 positions shown; findings below may reference images not displayed]

FINDINGS: The heart size and mediastinal contours are within normal limits.
Both lungs are clear. The visualized skeletal structures are
unremarkable.
IMPRESSION: No active cardiopulmonary disease.

## 2021-05-18 NOTE — Progress Notes (Signed)
Subjective:    Patient ID: Leslie Ward, female    DOB: [birth year], 87 y.o.   MRN: [id number]  DOS:  05/18/2021 Type of visit - description: Acute, here with her son  Diagnosed with COVID 03/20/2021, went to the ER, Rx Paxlovid. Since then is having a number of symptoms, has seen primary doctor several times.  Main concern today is fatigue, reports no DOE or chest pain with exertion, is simply tired and has a hard time even getting out of bed.  Also back pain, located at the lumbar spine, bilaterally, no radiation to the buttocks or to the legs.  Symptoms decreased with aspirin.  HOH: Worse since she had COVID, already saw an audiologist, to get hearing aids.  Also complaining of pain at the posterior head.  CT head 03/20/2021 was nonacute.  Labs  04/05/2021:  CBCs, CMP essentially normal, chest x-ray: Small left pleural effusion Labs 04/20/2021: CRP 152, high.  WBC 12.2, hemoglobin 11.2 slightly elevated, platelets elevated. Labs 04/27/2021: CRP decreased to 28, sed rate 116 (down from 130), white cells elevated to 15.7, platelets remain elevated at 536   Received a round of doxycycline for possible sinusitis on 04/23/2021.   Wt Readings from Last 3 Encounters:  05/18/21 117 lb 2 oz (53.1 kg)  04/27/21 113 lb 9.6 oz (51.5 kg)  04/20/21 115 lb 3.2 oz (52.3 kg)     Review of Systems Denies fever, weight loss. No headache per se, no amaurosis fugax. No chest pain, edema or palpitations No myalgias per se No dysuria or gross hematuria No nausea, vomiting or blood in the stools.  Past Medical History:  Diagnosis Date   Dystonia    Hypertension    Thyroid disease     Past Surgical History:  Procedure Laterality Date   NO PAST SURGERIES      Allergies as of 05/18/2021       Reactions   Crestor [rosuvastatin Calcium] Other (See Comments)   "caused me to go into kidney failure"        Medication List        Accurate as of May 18, 2021 11:59 PM. If you have  any questions, ask your nurse or doctor.          STOP taking these medications    benzonatate 100 MG capsule Commonly known as: TESSALON Stopped by: Willow Ora, MD   doxycycline 100 MG tablet Commonly known as: VIBRA-TABS Stopped by: Willow Ora, MD   methylPREDNISolone 4 MG tablet Commonly known as: Medrol Stopped by: Willow Ora, MD       TAKE these medications    amLODipine 5 MG tablet Commonly known as: NORVASC TAKE 1 TABLET(5 MG) BY MOUTH TWICE DAILY   CALCIUM 600 PO Take 2 tablets by mouth daily.   CHOLEST CARE PO Take 1,600 mg by mouth daily.   Cinnamon 500 MG capsule Take 1,000 mg by mouth daily.   FIBER-CAPS PO Take 1 capsule by mouth 2 (two) times a day.   fluticasone 50 MCG/ACT nasal spray Commonly known as: FLONASE Place 2 sprays into both nostrils daily. What changed: Another medication with the same name was removed. Continue taking this medication, and follow the directions you see here. Changed by: Willow Ora, MD   gabapentin 100 MG capsule Commonly known as: NEURONTIN Take 1 capsule (100 mg total) by mouth at bedtime.   levothyroxine 50 MCG tablet Commonly known as: SYNTHROID TAKE ONE TABLET BY MOUTH DAILY BEFORE BREAKFAST   meclizine  12.5 MG tablet Commonly known as: ANTIVERT Take 1 tablet (12.5 mg total) by mouth 3 (three) times daily as needed for dizziness.   MELATONIN PO Take 2-3 tablets by mouth at bedtime as needed (sleep).   metoprolol tartrate 50 MG tablet Commonly known as: LOPRESSOR Take 1 tablet (50 mg total) by mouth 2 (two) times daily.   multivitamin tablet Take 1 tablet by mouth daily.   OMEGA 3 PO Take 2 tablets by mouth every evening.   pravastatin 20 MG tablet Commonly known as: PRAVACHOL Take 1 tablet (20 mg total) by mouth 2 (two) times a week.   PROBIOTIC DAILY PO Take 1 tablet by mouth daily.   telmisartan 20 MG tablet Commonly known as: MICARDIS Take 1 tablet (20 mg total) by mouth daily.   vitamin C  500 MG tablet Commonly known as: ASCORBIC ACID Take 500 mg by mouth every evening.   VITAMIN K2 PO Take 1 tablet by mouth at bedtime.   Xeomin 100 units Solr injection Generic drug: incobotulinumtoxinA Inject 100 Units into the muscle every 3 (three) months.           Objective:   Physical Exam BP 126/74 (BP Location: Right Arm, Patient Position: Sitting, Cuff Size: Small)   Pulse 97   Temp 97.9 F (36.6 C) (Oral)   Resp 16   Ht 4\' 9"  (1.448 m)   Wt 117 lb 2 oz (53.1 kg)   SpO2 97%   BMI 25.35 kg/m  General:   Well developed, NAD, BMI noted.  HEENT:  Normocephalic . Face symmetric, atraumatic. Temporary arteries: Pulses present, not tender. Neck: Obvious dystonia.  No thyromegaly Lungs:  CTA B Normal respiratory effort, no intercostal retractions, no accessory muscle use. Heart: RRR,  no murmur.  Abdomen:  Not distended, soft, non-tender. No rebound or rigidity.   Skin: Not pale. Not jaundice Lower extremities: no pretibial edema bilaterally  Neurologic:  alert & oriented X3.  Speech normal, gait unassisted but limited by age- dystonia - fragility.  Needs help transferring. Psych--  Cognition and judgment appear intact.  Cooperative with normal attention span and concentration.  Behavior appropriate. No anxious or depressed appearing.     Assessment     86 year old female, history of DM, hypothyroidism, hyperlipidemia, HTN, dystonia presents with the following issues  Fatigue: Fatigue since she was diagnosed with COVID and treated with Paxlovid 03-2021. Since then, markers of inflammation have been elevated. With elevated sed rate I typically suspect temporary arteritis or PMR however she denies headache per se, myalgias, amaurosis fugax. Palpation of the temporal arteries not tender. EKG today: NSR, no major changes compared to previous test. Plan: Trend markers of inflammation, check CBC, sed rate, CRP. Request a UA UCX Refer to pulmonary :  ?  long  COVID. Chest x-ray to follow-up on the left pleural effusion Hypothyroidism: Check TSH Hyperglycemia, check A1c Addendum,Sed rate CRP remain elevated, will discuss with pulmonary or ID.   Time spent 45 minutes, the patient has a number of symptoms that needed attention, I also did extensive chart review including her ER visit, multiple labs done in the last few weeks by PCP.  This visit occurred during the SARS-CoV-2 public health emergency.  Safety protocols were in place, including screening questions prior to the visit, additional usage of staff PPE, and extensive cleaning of exam room while observing appropriate contact time as indicated for disinfecting solutions.

## 2021-05-18 NOTE — Patient Instructions (Signed)
Please call pulmonary, the number is [phone]  GO TO THE FRONT DESK, PLEASE SCHEDULE YOUR APPOINTMENTS Come back for   a checkup in 6 weeks    STOP BY THE FIRST FLOOR:  get the XR

## 2021-05-19 LAB — CBC WITH DIFFERENTIAL/PLATELET
Absolute Monocytes: 777 cells/uL (ref 200–950)
Basophils Absolute: 56 cells/uL (ref 0–200)
Basophils Relative: 0.5 %
Eosinophils Absolute: 155 cells/uL (ref 15–500)
Eosinophils Relative: 1.4 %
HCT: 34.5 % — ABNORMAL LOW (ref 35.0–45.0)
Hemoglobin: 11.1 g/dL — ABNORMAL LOW (ref 11.7–15.5)
Lymphs Abs: 3918 cells/uL — ABNORMAL HIGH (ref 850–3900)
MCH: 29.2 pg (ref 27.0–33.0)
MCHC: 32.2 g/dL (ref 32.0–36.0)
MCV: 90.8 fL (ref 80.0–100.0)
MPV: 8.8 fL (ref 7.5–12.5)
Monocytes Relative: 7 %
Neutro Abs: 6194 cells/uL (ref 1500–7800)
Neutrophils Relative %: 55.8 %
Platelets: 509 10*3/uL — ABNORMAL HIGH (ref 140–400)
RBC: 3.8 10*6/uL (ref 3.80–5.10)
RDW: 13.4 % (ref 11.0–15.0)
Total Lymphocyte: 35.3 %
WBC: 11.1 10*3/uL — ABNORMAL HIGH (ref 3.8–10.8)

## 2021-05-19 LAB — COMPREHENSIVE METABOLIC PANEL
AG Ratio: 1.2 (calc) (ref 1.0–2.5)
ALT: 9 U/L (ref 6–29)
AST: 12 U/L (ref 10–35)
Albumin: 3.5 g/dL — ABNORMAL LOW (ref 3.6–5.1)
Alkaline phosphatase (APISO): 64 U/L (ref 37–153)
BUN: 20 mg/dL (ref 7–25)
CO2: 26 mmol/L (ref 20–32)
Calcium: 9.4 mg/dL (ref 8.6–10.4)
Chloride: 100 mmol/L (ref 98–110)
Creat: 0.93 mg/dL (ref 0.60–0.95)
Globulin: 2.9 g/dL (calc) (ref 1.9–3.7)
Glucose, Bld: 163 mg/dL — ABNORMAL HIGH (ref 65–99)
Potassium: 3.9 mmol/L (ref 3.5–5.3)
Sodium: 136 mmol/L (ref 135–146)
Total Bilirubin: 0.6 mg/dL (ref 0.2–1.2)
Total Protein: 6.4 g/dL (ref 6.1–8.1)

## 2021-05-19 LAB — HEMOGLOBIN A1C
Hgb A1c MFr Bld: 6.1 % of total Hgb — ABNORMAL HIGH (ref <5.7)
Mean Plasma Glucose: 128 mg/dL
eAG (mmol/L): 7.1 mmol/L

## 2021-05-19 LAB — C-REACTIVE PROTEIN: CRP: 83.1 mg/L — ABNORMAL HIGH (ref <8.0)

## 2021-05-19 LAB — SEDIMENTATION RATE: Sed Rate: 128 mm/h — ABNORMAL HIGH (ref 0–30)

## 2021-05-19 LAB — TSH: TSH: 3.97 mIU/L (ref 0.40–4.50)

## 2021-05-21 ENCOUNTER — Other Ambulatory Visit: Payer: Self-pay

## 2021-05-21 ENCOUNTER — Other Ambulatory Visit (INDEPENDENT_AMBULATORY_CARE_PROVIDER_SITE_OTHER): Payer: Medicare HMO

## 2021-05-21 ENCOUNTER — Ambulatory Visit: Payer: Medicare HMO | Admitting: Medical

## 2021-05-21 ENCOUNTER — Encounter: Payer: Self-pay | Admitting: Internal Medicine

## 2021-05-21 DIAGNOSIS — M545 Low back pain, unspecified: Secondary | ICD-10-CM

## 2021-05-21 DIAGNOSIS — N390 Urinary tract infection, site not specified: Secondary | ICD-10-CM | POA: Diagnosis not present

## 2021-05-22 ENCOUNTER — Telehealth: Payer: Self-pay | Admitting: Internal Medicine

## 2021-05-22 DIAGNOSIS — N393 Stress incontinence (female) (male): Secondary | ICD-10-CM | POA: Diagnosis not present

## 2021-05-22 DIAGNOSIS — N959 Unspecified menopausal and perimenopausal disorder: Secondary | ICD-10-CM | POA: Diagnosis not present

## 2021-05-22 DIAGNOSIS — R159 Full incontinence of feces: Secondary | ICD-10-CM | POA: Diagnosis not present

## 2021-05-22 DIAGNOSIS — N3281 Overactive bladder: Secondary | ICD-10-CM | POA: Diagnosis not present

## 2021-05-22 LAB — URINALYSIS, ROUTINE W REFLEX MICROSCOPIC
Bilirubin Urine: NEGATIVE
Hgb urine dipstick: NEGATIVE
Ketones, ur: NEGATIVE
Leukocytes,Ua: NEGATIVE
Nitrite: NEGATIVE
RBC / HPF: NONE SEEN (ref 0–?)
Specific Gravity, Urine: 1.015 (ref 1.000–1.030)
Total Protein, Urine: NEGATIVE
Urine Glucose: NEGATIVE
Urobilinogen, UA: 0.2 (ref 0.0–1.0)
pH: 7 (ref 5.0–8.0)

## 2021-05-22 MED ORDER — PREDNISONE 5 MG PO TBEC: DELAYED_RELEASE_TABLET | ORAL | 0 refills | Status: DC

## 2021-05-22 NOTE — Telephone Encounter (Signed)
Labs reviewed. Chest x-ray clear. Urine culture pending, TSH okay. Set rate increased to 128, CRP increased to 83, CBC show platelet count of 509, WBCs decreased to 11.1. In summary the patient continue with elevated markers of inflammation. I got advice from [place] MD and  ID on-call: Not uncommon to have elevated markers post-COVID.  Other etiologies need to be rule out. On clinical grounds I cannot point to a source of infection such as pneumonia, UTI, thus I'll proceed with a evaluation of elderly female with elevated sed rate. Also Dr. Orvan Falconer will arrange an ID visit Plan: Prednisone   ID visit Consider rheumatology referral to rule out other etiologies such as PMR, TA, RA. All of the above discussed with the patient's son who will pass this information to his mother.

## 2021-05-23 LAB — URINE CULTURE
MICRO NUMBER:: 12481890
SPECIMEN QUALITY:: ADEQUATE

## 2021-05-23 MED ORDER — AMOXICILLIN 875 MG PO TABS: 875.0000 mg | ORAL_TABLET | Freq: Two times a day (BID) | ORAL | 0 refills | Status: AC

## 2021-05-23 NOTE — Addendum Note (Signed)
Addended byConrad [place] D on: 05/23/2021 04:12 PM   Modules accepted: Orders

## 2021-05-25 DIAGNOSIS — H903 Sensorineural hearing loss, bilateral: Secondary | ICD-10-CM | POA: Diagnosis not present

## 2021-05-29 ENCOUNTER — Encounter: Payer: Self-pay | Admitting: Internal Medicine

## 2021-05-29 ENCOUNTER — Ambulatory Visit (INDEPENDENT_AMBULATORY_CARE_PROVIDER_SITE_OTHER): Payer: Medicare HMO | Admitting: Internal Medicine

## 2021-05-29 ENCOUNTER — Other Ambulatory Visit: Payer: Self-pay

## 2021-05-29 DIAGNOSIS — M2550 Pain in unspecified joint: Secondary | ICD-10-CM

## 2021-05-29 DIAGNOSIS — U099 Post covid-19 condition, unspecified: Secondary | ICD-10-CM

## 2021-05-29 DIAGNOSIS — U071 COVID-19: Secondary | ICD-10-CM | POA: Insufficient documentation

## 2021-05-29 DIAGNOSIS — M791 Myalgia, unspecified site: Secondary | ICD-10-CM | POA: Diagnosis not present

## 2021-05-29 DIAGNOSIS — G9332 Myalgic encephalomyelitis/chronic fatigue syndrome: Secondary | ICD-10-CM

## 2021-05-29 DIAGNOSIS — G8929 Other chronic pain: Secondary | ICD-10-CM

## 2021-05-29 HISTORY — DX: Post covid-19 condition, unspecified: U09.9

## 2021-05-29 HISTORY — DX: COVID-19: U07.1

## 2021-05-29 HISTORY — DX: Other chronic pain: G89.29

## 2021-05-29 HISTORY — DX: Myalgic encephalomyelitis/chronic fatigue syndrome: G93.32

## 2021-05-29 HISTORY — DX: Pain in unspecified joint: M25.50

## 2021-05-29 NOTE — Progress Notes (Signed)
      [facility]  Reason for Consult: Possible post-COVID syndrome Referring Provider: Dr. Jos Paz  Assessment: Her persistent symptoms and elevated inflammatory markers are nonspecific but certainly the temporal association with recently diagnosed COVID suggest that the symptoms could be related to post COVID syndrome (long COVID).  She has had improvement since starting prednisone last week.  I am not aware of any studies about steroid therapy for post-COVID syndrome.  I talked to them about the other possibility of an inflammatory syndrome such as polymyalgia rheumatica.  Her symptoms are certainly compatible I would expect her symptoms to improve with steroids.  However, I think it is less likely that she just happened to develope polymyalgia as she was recovering from COVID.  I recommend seeing how she does once she has tapered off of prednisone.  I would not commit her to a longer course of prednisone at this time.  Plan: She will complete a tapering course of prednisone and follow-up by phone in 2 weeks  Patient Active Problem List   Diagnosis Date Noted   COVID 05/29/2021    Priority: High   Post-COVID chronic fatigue 05/29/2021    Priority: High   Post-COVID chronic joint pain 05/29/2021    Priority: High   Post-COVID chronic muscle pain 05/29/2021    Priority: High   Thyroid disease    Ganglion cyst 08/25/2020   Mixed incontinence 03/15/2020   Urgency incontinence 03/15/2020   Vertigo 12/01/2019   Cervical dystonia 03/08/2019   Hyperlipidemia 02/11/2019   Edema 02/11/2019   Hypertension 01/19/2019   Type 2 diabetes mellitus (HCC) 01/19/2019   Hypothyroidism (acquired) 01/19/2019   Neck pain 01/19/2019   Dystonia 01/19/2019   Adhesive capsulitis of left shoulder 12/22/2018    Patient's Medications  New Prescriptions   No medications on file  Previous Medications   AMLODIPINE (NORVASC) 5 MG TABLET    TAKE 1 TABLET(5 MG) BY MOUTH  TWICE DAILY   AMOXICILLIN (AMOXIL) 875 MG TABLET    Take 1 tablet (875 mg total) by mouth 2 (two) times daily for 10 days.   CALCIUM CARBONATE (CALCIUM 600 PO)    Take 2 tablets by mouth daily.   CALCIUM POLYCARBOPHIL (FIBER-CAPS PO)    Take 1 capsule by mouth 2 (two) times a day.    CINNAMON 500 MG CAPSULE    Take 1,000 mg by mouth daily.   FLUTICASONE (FLONASE) 50 MCG/ACT NASAL SPRAY    Place 2 sprays into both nostrils daily.   GABAPENTIN (NEURONTIN) 100 MG CAPSULE    Take 1 capsule (100 mg total) by mouth at bedtime.   INCOBOTULINUMTOXINA (XEOMIN) 100 UNITS SOLR INJECTION    Inject 100 Units into the muscle every 3 (three) months.   LEVOTHYROXINE (SYNTHROID) 50 MCG TABLET    TAKE ONE TABLET BY MOUTH DAILY BEFORE BREAKFAST   MECLIZINE (ANTIVERT) 12.5 MG TABLET    Take 1 tablet (12.5 mg total) by mouth 3 (three) times daily as needed for dizziness.   MELATONIN PO    Take 2-3 tablets by mouth at bedtime as needed (sleep).    MENAQUINONE-7 (VITAMIN K2 PO)    Take 1 tablet by mouth at bedtime.    METOPROLOL TARTRATE (LOPRESSOR) 50 MG TABLET    Take 1 tablet (50 mg total) by mouth 2 (two) times daily.   MULTIPLE VITAMIN (MULTIVITAMIN) TABLET    Take 1 tablet by mouth daily.   OMEGA-3 FATTY ACIDS (OMEGA 3 PO)      Take 2 tablets by mouth every evening.    PHYTOSTEROL ESTERS (CHOLEST CARE PO)    Take 1,600 mg by mouth daily.   PRAVASTATIN (PRAVACHOL) 20 MG TABLET    Take 1 tablet (20 mg total) by mouth 2 (two) times a week.   PREDNISONE 5 MG TBEC    4 tablets by mouth x2 days, 3 tablets by mouth x2 days, 2 tablets x 2 days, 1 tablet by mouth   PROBIOTIC PRODUCT (PROBIOTIC DAILY PO)    Take 1 tablet by mouth daily.   TELMISARTAN (MICARDIS) 20 MG TABLET    Take 1 tablet (20 mg total) by mouth daily.   VITAMIN C (ASCORBIC ACID) 500 MG TABLET    Take 500 mg by mouth every evening.   Modified Medications   No medications on file  Discontinued Medications   No medications on file    HPI: Leslie Ward is a 87 y.o. female who was in reasonably good health until she developed breakthrough COVID in early August.  She was diagnosed on 03/20/2021.  She was treated with Paxlovid.  Her cough improved but she was left with persistent severe fatigue, posterior headache and generalized arthralgias and myalgias.  In early September she developed sudden hearing loss.  She was evaluated by ENT and was fitted for hearing aids.  She has been evaluated by primary care on several occasions recently.  Her ESR and CRP have been quite elevated.  Her hemoglobin was just slightly below normal.  Liver enzymes were normal.  ANA, rheumatoid factor and TSH were all normal.  Chest x-ray obtained in August showed a very small left-sided pleural effusion.  A chest x-ray repeated on 05/18/2021 showed no abnormalities.  He was treated with doxycycline on 04/23/2021 for possible sinusitis but did not notice any improvement in her symptoms.  She was evaluated again last week.  Initially she told me she had no urinary symptoms and then said she may have had slight burning.  She was started on amoxicillin and the burning has resolved.  She was also started on prednisone on 05/22/2021 and says that she is "200% better".  She has noted significant improvement in her fatigue and pain. Review of Systems: Review of Systems  Constitutional:  Positive for malaise/fatigue. Negative for chills, diaphoresis, fever and weight loss.  HENT:  Positive for congestion and hearing loss. Negative for sore throat.        She has had no change in her sense of smell or taste.  Eyes:        She has a history of glaucoma.  She has not had any recent changes in her vision.  Respiratory:  Negative for cough, sputum production and shortness of breath.   Cardiovascular:  Negative for chest pain.  Gastrointestinal:  Negative for abdominal pain, diarrhea, nausea and vomiting.  Genitourinary:  Positive for dysuria.  Musculoskeletal:  Positive for joint  pain, myalgias and neck pain.  Skin:  Negative for rash.  Neurological:  Positive for dizziness, tremors and headaches.       She says that she has trouble walking because she feels like her brain cannot tell her feet to move.  Psychiatric/Behavioral:  Negative for depression. The patient is not nervous/anxious.      Past Medical History:  Diagnosis Date   Dystonia    Hypertension    Thyroid disease     Social History   Tobacco Use   Smoking status: Never   Smokeless tobacco: Never  Vaping Use   Vaping Use: Never used  Substance Use Topics   Alcohol use: No   Drug use: No    Family History  Problem Relation Age of Onset   Stroke Mother    Heart attack Father    Heart Problems Brother    Allergies  Allergen Reactions   Crestor [Rosuvastatin Calcium] Other (See Comments)    "caused me to go into kidney failure"    OBJECTIVE: Vitals:   05/29/21 1546  BP: (!) 154/70  Pulse: 65  Temp: (!) 96.9 F (36.1 C)  TempSrc: Temporal  Weight: 116 lb (52.6 kg)   Body mass index is 25.1 kg/m.   Physical Exam Constitutional:      Comments: She is very pleasant and in no obvious distress.  She is accompanied by her son, Jacqulyn Bath.  She is hard of hearing and usually defers to him to help answer my questions.  Eyes:     Conjunctiva/sclera: Conjunctivae normal.  Cardiovascular:     Rate and Rhythm: Normal rate and regular rhythm.     Heart sounds: No murmur heard. Pulmonary:     Effort: Pulmonary effort is normal.     Breath sounds: Normal breath sounds.  Abdominal:     Palpations: Abdomen is soft.     Tenderness: There is no abdominal tenderness.  Musculoskeletal:        General: Tenderness present. No swelling.     Cervical back: Neck supple.  Skin:    Findings: No rash.  Neurological:     Gait: Gait normal.     Comments: She has a side to side tremor of her head.  Psychiatric:        Mood and Affect: Mood normal.    Microbiology: Recent Results (from the past  240 hour(s))  Urine Culture     Status: Abnormal   Collection Time: 05/21/21  3:39 PM   Specimen: Urine  Result Value Ref Range Status   MICRO NUMBER: [id number]  Final   SPECIMEN QUALITY: Adequate  Final   Sample Source NOT GIVEN  Final   STATUS: FINAL  Final   ISOLATE 1: Enterococcus faecalis (A)  Final    Comment: Greater than 100,000 CFU/mL of Enterococcus faecalis      Susceptibility   Enterococcus faecalis - URINE CULTURE POSITIVE 1    AMPICILLIN <=2 Sensitive     VANCOMYCIN 2 Sensitive     NITROFURANTOIN* <=16 Sensitive      * Legend: S = Susceptible  I = Intermediate R = Resistant  NS = Not susceptible * = Not tested  NR = Not reported **NN = See antimicrobic comments     Michel Bickers, MD [place] [facility] [place] [phone] pager   [phone] cell 05/29/2021, 5:09 PM

## 2021-06-01 ENCOUNTER — Other Ambulatory Visit: Payer: Self-pay | Admitting: Medical

## 2021-06-04 DIAGNOSIS — H903 Sensorineural hearing loss, bilateral: Secondary | ICD-10-CM | POA: Diagnosis not present

## 2021-06-07 NOTE — Progress Notes (Signed)
Cardiology Office Note:    Date:  06/08/2021   ID:  Leslie Ward, DOB 1931-05-12, MRN [id number]  PCP:  Leslie Richters, PA-C  Cardiologist:  Leslie Herrlich, MD    Referring MD: Leslie Richters, PA-C    ASSESSMENT:    1. Primary hypertension   2. Mixed hyperlipidemia   3. Statin intolerance   4. Post-COVID chronic muscle pain    PLAN:    In order of problems listed above:  Stable she is on a good medical regimen well-tolerated continue the same recent labs show stable kidney function potassium and serum sodium and she is not on a diuretic Especially with her current deterioration frailty muscle weakness and diffuse weakness I would not initiate any lipid-lowering therapy at this time I do not think it improve the quality of her life She is struggling with post-COVID symptoms and is seeing appropriate specialist I encouraged her most people do improve over time.  Perhaps physical therapy would be helpful   Next appointment: 6 months   Medication Adjustments/Labs and Tests Ordered: Current medicines are reviewed at length with the patient today.  Concerns regarding medicines are outlined above.  No orders of the defined types were placed in this encounter.  No orders of the defined types were placed in this encounter.   Chief Complaint  Patient presents with   Follow-up   Hypertension   Hyperlipidemia    History of Present Illness:    Leslie Ward is a 87 y.o. female with a hx of hypertension treated with calcium channel blocker beta-blocker and ARB Hyperlipidemia with statin intolerance and type 2 diabetes last seen 11/09/2020.  Recently she has had persistent symptoms after COVID 19 infection in August 2020.  As always she talks to me about her brother is a retired Development worker, international aid from [employer] Compliance with diet, lifestyle and medications: Yes  Prior to COVID she struggled since then she has had a marked deterioration with diffuse weakness and  especially muscle weakness and has trouble getting out of a chair and ambulating. She is not checking her blood pressure at home She had no cardiovascular symptoms of chest pain edema shortness of breath palpitation or syncope. Repeat blood pressure by me is 144/70 appropriate for approaching age 78 Past Medical History:  Diagnosis Date   Dystonia    Hypertension    Thyroid disease     Past Surgical History:  Procedure Laterality Date   NO PAST SURGERIES      Current Medications: Current Meds  Medication Sig   amLODipine (NORVASC) 5 MG tablet TAKE 1 TABLET(5 MG) BY MOUTH TWICE DAILY   Calcium Carbonate (CALCIUM 600 PO) Take 2 tablets by mouth daily.   Calcium Polycarbophil (FIBER-CAPS PO) Take 1 capsule by mouth 2 (two) times a day.    Cinnamon 500 MG capsule Take 1,000 mg by mouth daily.   fluticasone (FLONASE) 50 MCG/ACT nasal spray Place 2 sprays into both nostrils daily.   gabapentin (NEURONTIN) 100 MG capsule Take 1 capsule (100 mg total) by mouth at bedtime.   incobotulinumtoxinA (XEOMIN) 100 units SOLR injection Inject 100 Units into the muscle every 3 (three) months.   levothyroxine (SYNTHROID) 50 MCG tablet TAKE ONE TABLET BY MOUTH DAILY BEFORE BREAKFAST   MELATONIN PO Take 2-3 tablets by mouth at bedtime as needed (sleep).    metoprolol tartrate (LOPRESSOR) 50 MG tablet TAKE ONE TABLET BY MOUTH TWICE A DAY   Multiple Vitamin (MULTIVITAMIN) tablet Take 1 tablet by mouth daily.  Omega-3 Fatty Acids (OMEGA 3 PO) Take 2 tablets by mouth every evening.    predniSONE 5 MG TBEC 4 tablets by mouth x2 days, 3 tablets by mouth x2 days, 2 tablets x 2 days, 1 tablet by mouth   Probiotic Product (PROBIOTIC DAILY PO) Take 1 tablet by mouth daily.   telmisartan (MICARDIS) 20 MG tablet Take 1 tablet (20 mg total) by mouth daily.   vitamin C (ASCORBIC ACID) 500 MG tablet Take 500 mg by mouth every evening.      Allergies:   Crestor [rosuvastatin calcium]   Social History    Socioeconomic History   Marital status: Widowed    Spouse name: Not on file   Number of children: 1   Years of education: 6 or 7 years of education   Highest education level: Not on file  Occupational History   Occupation: Retired  Tobacco Use   Smoking status: Never   Smokeless tobacco: Never  Vaping Use   Vaping Use: Never used  Substance and Sexual Activity   Alcohol use: No   Drug use: No   Sexual activity: Not on file  Other Topics Concern   Not on file  Social History Narrative   Lives alone.   Right-handed.   No caffeine use.   Social Determinants of Health   Financial Resource Strain: Not on file  Food Insecurity: Not on file  Transportation Needs: Not on file  Physical Activity: Not on file  Stress: Not on file  Social Connections: Not on file     Family History: The patient's family history includes Heart Problems in her brother; Heart attack in her father; Stroke in her mother. ROS:   Please see the history of present illness.    All other systems reviewed and are negative.  EKGs/Labs/Other Studies Reviewed:    The following studies were reviewed today:  EKG:  EKG 05/18/2021 at her PCP office shows sinus rhythm Baseline wander minor nonspecific ST abnormality   Recent Labs: 05/18/2021: ALT 9; BUN 20; Creat 0.93; Hemoglobin 11.1; Platelets 509; Potassium 3.9; Sodium 136; TSH 3.97  Recent Lipid Panel    Component Value Date/Time   CHOL 275 (H) 11/09/2020 1101   TRIG 115 11/09/2020 1101   HDL 67 11/09/2020 1101   CHOLHDL 4.1 11/09/2020 1101   CHOLHDL 4.0 12/01/2019 0344   VLDL 17 12/01/2019 0344   LDLCALC 188 (H) 11/09/2020 1101    Physical Exam:    VS:  BP (!) 179/78   Pulse 64   Ht 4\' 9"  (1.448 m)   Wt 116 lb (52.6 kg)   SpO2 98%   BMI 25.10 kg/m     Wt Readings from Last 3 Encounters:  06/08/21 116 lb (52.6 kg)  05/29/21 116 lb (52.6 kg)  05/18/21 117 lb 2 oz (53.1 kg)     GEN: She looks very frail and weak I had to assist her  in transfer and walk across the room to recheck her blood pressure myself  HEENT: Normal NECK: No JVD; No carotid bruits LYMPHATICS: No lymphadenopathy CARDIAC: RRR, no murmurs, rubs, gallops RESPIRATORY:  Clear to auscultation without rales, wheezing or rhonchi  ABDOMEN: Soft, non-tender, non-distended MUSCULOSKELETAL:  No edema; No deformity  SKIN: Warm and dry NEUROLOGIC:  Alert and oriented x 3 PSYCHIATRIC:  Normal affect    Signed, 07/18/21, MD  06/08/2021 9:43 AM    [facility]

## 2021-06-08 ENCOUNTER — Other Ambulatory Visit: Payer: Self-pay

## 2021-06-08 ENCOUNTER — Encounter: Payer: Self-pay | Admitting: Cardiology

## 2021-06-08 ENCOUNTER — Ambulatory Visit (INDEPENDENT_AMBULATORY_CARE_PROVIDER_SITE_OTHER): Payer: Medicare HMO | Admitting: Cardiology

## 2021-06-08 VITALS — BP 144/70 | HR 64 | Ht <= 58 in | Wt 116.0 lb

## 2021-06-08 DIAGNOSIS — Z789 Other specified health status: Secondary | ICD-10-CM

## 2021-06-08 DIAGNOSIS — I10 Essential (primary) hypertension: Secondary | ICD-10-CM | POA: Diagnosis not present

## 2021-06-08 DIAGNOSIS — E782 Mixed hyperlipidemia: Secondary | ICD-10-CM | POA: Diagnosis not present

## 2021-06-08 DIAGNOSIS — M791 Myalgia, unspecified site: Secondary | ICD-10-CM | POA: Diagnosis not present

## 2021-06-08 DIAGNOSIS — U099 Post covid-19 condition, unspecified: Secondary | ICD-10-CM | POA: Diagnosis not present

## 2021-06-08 DIAGNOSIS — G8929 Other chronic pain: Secondary | ICD-10-CM

## 2021-06-08 NOTE — Patient Instructions (Signed)

## 2021-06-19 ENCOUNTER — Ambulatory Visit: Payer: Medicare HMO

## 2021-06-19 ENCOUNTER — Other Ambulatory Visit: Payer: Self-pay | Admitting: *Deleted

## 2021-06-19 ENCOUNTER — Other Ambulatory Visit: Payer: Self-pay

## 2021-06-19 ENCOUNTER — Telehealth: Payer: Self-pay | Admitting: Internal Medicine

## 2021-06-19 DIAGNOSIS — R700 Elevated erythrocyte sedimentation rate: Secondary | ICD-10-CM

## 2021-06-19 LAB — CBC
HCT: 36.1 % (ref 36.0–46.0)
Hemoglobin: 11.9 g/dL — ABNORMAL LOW (ref 12.0–15.0)
MCHC: 32.9 g/dL (ref 30.0–36.0)
MCV: 90.1 fl (ref 78.0–100.0)
Platelets: 378 10*3/uL (ref 150.0–400.0)
RBC: 4.01 Mil/uL (ref 3.87–5.11)
RDW: 15.6 % — ABNORMAL HIGH (ref 11.5–15.5)
WBC: 8.5 10*3/uL (ref 4.0–10.5)

## 2021-06-19 LAB — C-REACTIVE PROTEIN: CRP: 12.1 mg/dL (ref 0.5–20.0)

## 2021-06-19 LAB — SEDIMENTATION RATE: Sed Rate: 72 mm/hr — ABNORMAL HIGH (ref 0–30)

## 2021-06-19 NOTE — Telephone Encounter (Signed)
Please call the patient and arrange for blood work before her visit with me on Friday 06/22/2021. CRP CBC Sed rate Dx elevated sed rate

## 2021-06-19 NOTE — Telephone Encounter (Signed)
Spoke with patient son and lab only appointment set up for today and future orders placed.

## 2021-06-20 ENCOUNTER — Encounter: Payer: Self-pay | Admitting: Internal Medicine

## 2021-06-21 ENCOUNTER — Telehealth: Payer: Self-pay | Admitting: Medical

## 2021-06-21 ENCOUNTER — Encounter: Payer: Self-pay | Admitting: Medical

## 2021-06-21 MED ORDER — PREDNISONE 5 MG PO TBEC: DELAYED_RELEASE_TABLET | ORAL | 0 refills | Status: DC

## 2021-06-21 NOTE — Telephone Encounter (Signed)
Dr. Orvan Falconer,  Thanks for your help with [facility]. I saw her various times post covid. She then saw Dr. Drue Novel and he referred to you. I reviewed your note and not sure if she missed follow up recently. Recent repeat inflammatory labs show persisting elevated sed rate but now 72. Patient is still having pain in shoulder, arms, wrist. Two days ago it was the bottom of her feet. There is no blurred vision.   She/ son sent me a message regarding getting back on prednisone.   I saw your note regarding post covid myalgia vs possible polymyalgia rheumatica. At this point was considering another taper dose prednisone pending possible rheumatology referral. Was curious at to what you think at this point.  Thank you for all your help.  [name], PA-C

## 2021-06-21 NOTE — Addendum Note (Signed)
Addended by: Gwenevere Abbot on: 06/21/2021 04:08 PM   Modules accepted: Orders

## 2021-06-22 ENCOUNTER — Ambulatory Visit: Payer: Medicare HMO | Admitting: Internal Medicine

## 2021-07-08 ENCOUNTER — Encounter: Payer: Self-pay | Admitting: Medical

## 2021-07-09 DIAGNOSIS — H401132 Primary open-angle glaucoma, bilateral, moderate stage: Secondary | ICD-10-CM | POA: Diagnosis not present

## 2021-07-10 ENCOUNTER — Telehealth: Payer: Self-pay | Admitting: Medical

## 2021-07-10 MED ORDER — PREDNISONE 5 MG PO TABS: 5.0000 mg | ORAL_TABLET | Freq: Every day | ORAL | 0 refills | Status: DC

## 2021-07-10 NOTE — Telephone Encounter (Signed)
Chart opened to rx med, order lab, review chart, respond to my chart message or send message to staff member . After review my chart message today. Rx prednisone.

## 2021-07-30 ENCOUNTER — Other Ambulatory Visit: Payer: Self-pay | Admitting: Medical

## 2021-08-05 ENCOUNTER — Encounter: Payer: Self-pay | Admitting: Medical

## 2021-08-14 ENCOUNTER — Ambulatory Visit: Payer: Medicare HMO | Admitting: Internal Medicine

## 2021-08-14 ENCOUNTER — Telehealth: Payer: Self-pay | Admitting: Medical

## 2021-08-14 NOTE — Telephone Encounter (Signed)
Nurse Assessment Nurse: Noralee Stain, RN, Christina Date/Time (Eastern Time): 08/14/2021 2:04:49 PM Confirm and document reason for call. If symptomatic, describe symptoms. ---Caller's mom has occasional dizziness for the past few weeks. Pt can go a few days without the dizziness. When the dizziness happens its usually only for a few seconds. Pt was seen by an ENT who helped the dizziness in the past. Pt had an appointment today that was missed. HX BPPV. Denies any other symptoms at this time. Does the patient have any new or worsening symptoms? ---Yes Will a triage be completed? ---Yes Related visit to physician within the last 2 weeks? ---No Does the PT have any chronic conditions? (i.e. diabetes, asthma, this includes High risk factors for pregnancy, etc.) ---Yes List chronic conditions. ---BPPV, COVID in august, joint inflammation. hypertension Is this a behavioral health or substance abuse call? ---No Guidelines Guideline Title Affirmed Question Affirmed Notes Nurse Date/Time (Eastern Time) Dizziness - Vertigo [1] NO dizziness now AND [2] one or more stroke risk factors (i.e., hypertension, Noralee Stain, RN, Margreta Journey 08/14/2021 2:10:09 PM PLEASE NOTE: All timestamps contained within this report are represented as [place] Standard Time. CONFIDENTIALTY NOTICE: This fax transmission is intended only for the addressee. It contains information that is legally privileged, confidential or otherwise protected from use or disclosure. If you are not the intended recipient, you are strictly prohibited from reviewing, disclosing, copying using or disseminating any of this information or taking any action in reliance on or regarding this information. If you have received this fax in error, please notify us immediately by telephone so that we can arrange for its return to [place]. Phone: [phone], Toll-Free: [phone], Fax: [phone] Page: 2 of 2 Call Id: [id number] Guidelines Guideline Title Affirmed  Question Affirmed Notes Nurse Date/Time Eilene Ghazi Time) diabetes, prior stroke/ TIA/heart attack) Disp. Time Eilene Ghazi Time) Disposition Final User 08/14/2021 2:17:34 PM See PCP within 24 Hours Yes Noralee Stain, RN, Margreta Journey Caller Disagree/Comply Comply Caller Understands Yes PreDisposition Call Doctor Care Advice Given Per Guideline SEE PCP WITHIN 24 HOURS: * IF OFFICE WILL BE OPEN: You need to be examined within the next 24 hours. Call your doctor (or NP/PA) when the office opens and make an appointment. FALL PREVENTION: * Sit at side of bed for several minutes before standing up. Stand up slowly. * Avoid sudden movements of head. CALL BACK IF: * Severe headache occurs * Weakness develops in an arm or leg * Unable to walk without falling * Dizziness becomes constant * You become worse CARE ADVICE given per Dizziness - Vertigo (Adult) guideline. Comments User: Aletta Edouard, RN Date/Time Eilene Ghazi Time): 08/14/2021 2:18:21 PM Caller will call the office to schedule and appointment for the patient Referrals REFERRED TO PCP OFFICE

## 2021-08-14 NOTE — Telephone Encounter (Signed)
pt r/s appointment for 1/5 with pcp. transferred to triage regarding symps.

## 2021-08-14 NOTE — Telephone Encounter (Signed)
Patient has an appointment on 08/16/21

## 2021-08-16 ENCOUNTER — Encounter: Payer: Self-pay | Admitting: Medical

## 2021-08-16 ENCOUNTER — Ambulatory Visit (INDEPENDENT_AMBULATORY_CARE_PROVIDER_SITE_OTHER): Payer: Medicare HMO | Admitting: Medical

## 2021-08-16 VITALS — BP 140/60 | HR 75 | Temp 98.6°F | Resp 18 | Ht <= 58 in | Wt 114.3 lb

## 2021-08-16 DIAGNOSIS — I10 Essential (primary) hypertension: Secondary | ICD-10-CM

## 2021-08-16 DIAGNOSIS — R42 Dizziness and giddiness: Secondary | ICD-10-CM

## 2021-08-16 DIAGNOSIS — R739 Hyperglycemia, unspecified: Secondary | ICD-10-CM

## 2021-08-16 DIAGNOSIS — M791 Myalgia, unspecified site: Secondary | ICD-10-CM

## 2021-08-16 LAB — CBC WITH DIFFERENTIAL/PLATELET
Basophils Absolute: 0 K/uL (ref 0.0–0.1)
Basophils Relative: 0.4 % (ref 0.0–3.0)
Eosinophils Absolute: 0.1 K/uL (ref 0.0–0.7)
Eosinophils Relative: 0.8 % (ref 0.0–5.0)
HCT: 39 % (ref 36.0–46.0)
Hemoglobin: 12.4 g/dL (ref 12.0–15.0)
Lymphocytes Relative: 32 % (ref 12.0–46.0)
Lymphs Abs: 3.5 K/uL (ref 0.7–4.0)
MCHC: 31.7 g/dL (ref 30.0–36.0)
MCV: 87.8 fl (ref 78.0–100.0)
Monocytes Absolute: 0.7 K/uL (ref 0.1–1.0)
Monocytes Relative: 6.7 % (ref 3.0–12.0)
Neutro Abs: 6.7 K/uL (ref 1.4–7.7)
Neutrophils Relative %: 60.1 % (ref 43.0–77.0)
Platelets: 282 K/uL (ref 150.0–400.0)
RBC: 4.44 Mil/uL (ref 3.87–5.11)
RDW: 16.7 % — ABNORMAL HIGH (ref 11.5–15.5)
WBC: 11.1 K/uL — ABNORMAL HIGH (ref 4.0–10.5)

## 2021-08-16 LAB — COMPREHENSIVE METABOLIC PANEL
ALT: 8 U/L (ref 0–35)
AST: 12 U/L (ref 0–37)
Albumin: 3.8 g/dL (ref 3.5–5.2)
Alkaline Phosphatase: 48 U/L (ref 39–117)
BUN: 20 mg/dL (ref 6–23)
CO2: 29 mEq/L (ref 19–32)
Calcium: 9 mg/dL (ref 8.4–10.5)
Chloride: 103 mEq/L (ref 96–112)
Creatinine, Ser: 0.86 mg/dL (ref 0.40–1.20)
GFR: 60.12 mL/min (ref >60.00)
Glucose, Bld: 100 mg/dL — ABNORMAL HIGH (ref 70–99)
Potassium: 3.9 mEq/L (ref 3.5–5.1)
Sodium: 139 mEq/L (ref 135–145)
Total Bilirubin: 0.9 mg/dL (ref 0.2–1.2)
Total Protein: 6.6 g/dL (ref 6.0–8.3)

## 2021-08-16 LAB — SEDIMENTATION RATE: Sed Rate: 75 mm/h — ABNORMAL HIGH (ref 0–30)

## 2021-08-16 LAB — HEMOGLOBIN A1C: Hgb A1c MFr Bld: 6.8 % — ABNORMAL HIGH (ref 4.6–6.5)

## 2021-08-16 MED ORDER — MECLIZINE HCL 12.5 MG PO TABS: 12.5000 mg | ORAL_TABLET | Freq: Three times a day (TID) | ORAL | 0 refills | Status: DC | PRN

## 2021-08-16 NOTE — Patient Instructions (Signed)
Combination of episodes of transient dizziness and vertigo occurring randomly.  Most of time events last 3 to 4 seconds at most.  No associated neurologic or motor deficits reported.  Making meclizine available if you have dizziness/vertigo lasting for more than 5 minutes.  If you ever have dizziness/vertigo associated with motor or sensory deficits then be seen in the emergency department.  Make sure that you are staying well-hydrated.  After discussion.  You decided that you could self refer to ENT as you described Epley maneuvers helped you a lot last time.  If you can self refer please let me know.  I will go ahead and get CBC and metabolic panel today.  Hypertension-blood pressure decently controlled today.  Continue your 3 med regimen.  If you notice any blood pressures above 150/90 then recommend doing 3 consecutive BP readings 5 minutes apart.  If blood pressure remaining still elevated let me know.  Elevated blood sugar.  Will get A1c today.  I recommend checking blood sugar if he had any dizzy events lasting more than 5 minutes.  History of myalgias with elevated sed rate post COVID.  We will get a sed rate today and glad to hear that she had a rheumatologist appointment next week.  Follow-up date to be determined after lab review.

## 2021-08-16 NOTE — Progress Notes (Addendum)
Subjective:    Patient ID: Leslie Ward, female    DOB: Oct 21, 1930, 88 y.o.   MRN: [id number]  HPI  Pt updates me that she does have appointment with rheumatologist coming up to discuss her muscle aches and joint pain post covid. Pt was on prednisone up until 2 days.   Pt states having dizziness 3-4 times a day. This morning in waiting room. Lasted for seconds. At times only light headed. Other times will have vertigo. But describes only that room feels like moves side to side.   Turning head and cause symptoms sometimes.  Sometimes symptoms will occur on standing.   In past pt was seen by ENT. In past when she went to ent describes that she had epley maneuvers done and this helped.   Post covid in sept 2022 had dizziness and ct was negative.  Yesteday evening bp 116 systolic. In am systolic was 159. On med review. She is on metoprol, telmisartan and amlodipine. Pt does see Dr. Dulce Sellar.   Review of Systems  Constitutional:  Negative for chills, fatigue and fever.  HENT:  Negative for congestion.   Respiratory:  Negative for cough, choking, shortness of breath and wheezing.   Cardiovascular:  Negative for chest pain and palpitations.  Gastrointestinal:  Negative for abdominal pain, diarrhea and rectal pain.  Musculoskeletal:  Negative for back pain.  Skin:  Negative for rash.  Neurological:  Positive for dizziness. Negative for seizures, syncope and light-headedness.  Hematological:  Negative for adenopathy. Does not bruise/bleed easily.  Psychiatric/Behavioral:  Negative for confusion and sleep disturbance. The patient is not nervous/anxious.     Past Medical History:  Diagnosis Date   Dystonia    Hypertension    Thyroid disease      Social History   Socioeconomic History   Marital status: Widowed    Spouse name: Not on file   Number of children: 1   Years of education: 6 or 7 years of education   Highest education level: Not on file  Occupational History    Occupation: Retired  Tobacco Use   Smoking status: Never   Smokeless tobacco: Never  Vaping Use   Vaping Use: Never used  Substance and Sexual Activity   Alcohol use: No   Drug use: No   Sexual activity: Not on file  Other Topics Concern   Not on file  Social History Narrative   Lives alone.   Right-handed.   No caffeine use.   Social Determinants of Health   Financial Resource Strain: Not on file  Food Insecurity: Not on file  Transportation Needs: Not on file  Physical Activity: Not on file  Stress: Not on file  Social Connections: Not on file  Intimate Partner Violence: Not on file    Past Surgical History:  Procedure Laterality Date   NO PAST SURGERIES      Family History  Problem Relation Age of Onset   Stroke Mother    Heart attack Father    Heart Problems Brother     Allergies  Allergen Reactions   Crestor [Rosuvastatin Calcium] Other (See Comments)    "caused me to go into kidney failure"    Current Outpatient Medications on File Prior to Visit  Medication Sig Dispense Refill   amLODipine (NORVASC) 5 MG tablet TAKE 1 TABLET(5 MG) BY MOUTH TWICE DAILY 180 tablet 2   Calcium Carbonate (CALCIUM 600 PO) Take 2 tablets by mouth daily.     Calcium Polycarbophil (FIBER-CAPS PO) Take  1 capsule by mouth 2 (two) times a day.      Cinnamon 500 MG capsule Take 1,000 mg by mouth daily.     fluticasone (FLONASE) 50 MCG/ACT nasal spray Place 2 sprays into both nostrils daily. 16 g 1   gabapentin (NEURONTIN) 100 MG capsule Take 1 capsule (100 mg total) by mouth at bedtime. 30 capsule 2   incobotulinumtoxinA (XEOMIN) 100 units SOLR injection Inject 100 Units into the muscle every 3 (three) months.     levothyroxine (SYNTHROID) 50 MCG tablet TAKE ONE TABLET BY MOUTH DAILY BEFORE BREAKFAST 90 tablet 0   MELATONIN PO Take 2-3 tablets by mouth at bedtime as needed (sleep).      Menaquinone-7 (VITAMIN K2 PO) Take 1 tablet by mouth at bedtime.     metoprolol tartrate  (LOPRESSOR) 50 MG tablet TAKE ONE TABLET BY MOUTH TWICE A DAY 180 tablet 1   Multiple Vitamin (MULTIVITAMIN) tablet Take 1 tablet by mouth daily.     Omega-3 Fatty Acids (OMEGA 3 PO) Take 2 tablets by mouth every evening.      Phytosterol Esters (CHOLEST CARE PO) Take 1,600 mg by mouth daily.     predniSONE (DELTASONE) 5 MG tablet Take 1 tablet (5 mg total) by mouth daily with breakfast. 30 tablet 0   Probiotic Product (PROBIOTIC DAILY PO) Take 1 tablet by mouth daily.     telmisartan (MICARDIS) 20 MG tablet Take 1 tablet (20 mg total) by mouth daily. 90 tablet 3   vitamin C (ASCORBIC ACID) 500 MG tablet Take 500 mg by mouth every evening.      No current facility-administered medications on file prior to visit.    BP 140/60    Pulse 75    Temp 98.6 F (37 C)    Resp 18    Ht 4\' 9"  (1.448 m)    Wt 114 lb 4.8 oz (51.8 kg)    SpO2 96%    BMI 24.73 kg/m       Objective:   Physical Exam   General Mental Status- Alert. General Appearance- Not in acute distress.   Skin General: Color- Normal Color. Moisture- Normal Moisture.  Neck Carotid Arteries- Normal color. Moisture- Normal Moisture. No carotid bruits. No JVD.  Chest and Lung Exam Auscultation: Breath Sounds:-Normal.  Cardiovascular Auscultation:Rythm- Regular. Murmurs & Other Heart Sounds:Auscultation of the heart reveals- No Murmurs.  Abdomen Inspection:-Inspeection Normal. Palpation/Percussion:Note:No mass. Palpation and Percussion of the abdomen reveal- Non Tender, Non Distended + BS, no rebound or guarding.   Neurologic Cranial Nerve exam:- CN III-XII intact(No nystagmus), symmetric smile. Drift Test:- No drift. Romberg Exam:- Negative.  Heal to Toe Gait exam:-Normal. Finger to Nose:- Normal/Intact Strength:- 5/5 equal and symmetric strength both upper and lower extremities.  No dizziness on sitting to standing.   Lying to  sitting up felt dizziness for 3-4 seconds. Turning head to left and rt no dizziness or  vertigo.  Heent- canals clear and normal tms.     Assessment & Plan:   Patient Instructions  Combination of episodes of transient dizziness and vertigo occurring randomly.  Most of time events last 3 to 4 seconds at most.  No associated neurologic or motor deficits reported.  Making meclizine available if you have dizziness/vertigo lasting for more than 5 minutes.  If you ever have dizziness/vertigo associated with motor or sensory deficits then be seen in the emergency department.  Make sure that you are staying well-hydrated.  After discussion.  You decided that you could  self refer to ENT as you described Epley maneuvers helped you a lot last time.  If you can self refer please let me know.  I will go ahead and get CBC and metabolic panel today.  Hypertension-blood pressure decently controlled today.  Continue your 3 med regimen.  If you notice any blood pressures above 150/90 then recommend doing 3 consecutive BP readings 5 minutes apart.  If blood pressure remaining still elevated let me know.  Elevated blood sugar.  Will get A1c today.  I recommend checking blood sugar if he had any dizzy events lasting more than 5 minutes.  History of myalgias with elevated sed rate post COVID.  We will get a sed rate today and glad to hear that she had a rheumatologist appointment next week.  Follow-up date to be determined after lab review.   Mackie Pai, PA-C    Time spent with patient today was 40  minutes which consisted of chart review, discussing diagnosis, work up ,treatment, answering questions and documentation.

## 2021-08-16 NOTE — Addendum Note (Signed)
Addended by: Anabel Halon on: 08/16/2021 10:32 AM   Modules accepted: Level of Service

## 2021-08-17 ENCOUNTER — Encounter: Payer: Self-pay | Admitting: Medical

## 2021-08-17 MED ORDER — BLOOD GLUCOSE METER KIT: PACK | 0 refills | Status: DC

## 2021-08-23 DIAGNOSIS — H8111 Benign paroxysmal vertigo, right ear: Secondary | ICD-10-CM | POA: Diagnosis not present

## 2021-08-23 NOTE — Progress Notes (Signed)
Office Visit Note  Patient: Leslie Ward             Date of Birth: 02-Feb-1931           MRN: [id number]             PCP: Mackie Pai, PA-C Referring: Elise Benne Visit Date: 08/24/2021  Subjective:  New Patient (Initial Visit) (Total body joint pain since COVID 03/2021, abnormal labs)   History of Present Illness: Leslie Ward is a 88 y.o. female here for evaluation of joint pain involving neck, shoulders, and wrists with elevated sedimentation rate.  She was ill with COVID in August of last year without severe acute complications and responded well to treatment with pack Slo-Bid.  However afterwards developed severe joint pain and stiffness in multiple areas worst affected in her neck shoulders and knees.  Her joint pains are worst first thing in the morning with a partial degree of improvement during the day.  She never noticed obvious visible swelling or overlying skin changes in affected areas. Besides the joint pains she is experiencing vertigo symptoms since her illness. She has some chronic osteoarthritis changes and dystonia with previous neck botox injections but nothing similar to the current symptoms. Imaging did not demonstrate any obvious related changes lab results showing very highly elevated ESR and CRP. She also developed probably UTI with E. Faecalis on urine culture which was treated during this time. Last month she resumed on prednisone at 5 mg daily dose which was controlling symptoms well but stopped it this week prior to our visit. She notices symptoms come back within about 2 days after stopping any steroids.  Her son Leslie Ward is present at our visit today.  Labs reviewed 08/2021 ESR 75  04/2021 ANA neg RF neg ESR >130 CRP 152.1   Activities of Daily Living:  Patient reports morning stiffness for 24 hours.   Patient Reports nocturnal pain.  Difficulty dressing/grooming: Reports Difficulty climbing stairs: Reports Difficulty getting out of  chair: Reports Difficulty using hands for taps, buttons, cutlery, and/or writing: Reports  Review of Systems  Constitutional:  Positive for fatigue.  HENT:  Negative for mouth dryness.   Eyes:  Negative for dryness.  Respiratory:  Negative for shortness of breath.   Cardiovascular:  Negative for swelling in legs/feet.  Gastrointestinal:  Negative for constipation.  Endocrine: Negative for excessive thirst.  Genitourinary:  Negative for difficulty urinating.  Musculoskeletal:  Positive for joint pain, gait problem, joint pain, joint swelling, muscle weakness and morning stiffness.  Skin:  Negative for rash.  Allergic/Immunologic: Negative for susceptible to infections.  Neurological:  Negative for numbness.  Hematological:  Negative for bruising/bleeding tendency.  Psychiatric/Behavioral:  Positive for sleep disturbance.    PMFS History:  Patient Active Problem List   Diagnosis Date Noted   Sedimentation rate elevation 08/24/2021   Bilateral shoulder pain 08/24/2021   Inflammatory arthritis 08/24/2021   Urinary frequency 08/24/2021   COVID 05/29/2021   Post-COVID chronic fatigue 05/29/2021   Post-COVID chronic joint pain 05/29/2021   Post-COVID chronic muscle pain 05/29/2021   Thyroid disease    Ganglion cyst 08/25/2020   Mixed incontinence 03/15/2020   Urgency incontinence 03/15/2020   Vertigo 12/01/2019   Cervical dystonia 03/08/2019   Hyperlipidemia 02/11/2019   Edema 02/11/2019   Hypertension 01/19/2019   Type 2 diabetes mellitus ([place]) 01/19/2019   Hypothyroidism (acquired) 01/19/2019   Neck pain 01/19/2019   Dystonia 01/19/2019   Adhesive capsulitis of left shoulder 12/22/2018  Past Medical History:  Diagnosis Date   Dystonia    Glaucoma    Hypertension    Thyroid disease     Family History  Problem Relation Age of Onset   Stroke Mother    Heart attack Father    Heart Problems Brother    Past Surgical History:  Procedure Laterality Date   NO PAST  SURGERIES     Social History   Social History Narrative   Lives alone.   Right-handed.   No caffeine use.   Immunization History  Administered Date(s) Administered   Fluad Quad(high Dose 65+) 06/11/2019, 06/13/2020   Moderna SARS-COV2 Booster Vaccination 07/21/2020   PFIZER(Purple Top)SARS-COV-2 Vaccination 12/20/2019, 01/10/2020     Objective: Vital Signs: BP (!) 153/74 (BP Location: Right Arm, Patient Position: Sitting, Cuff Size: Normal)    Pulse 84    Resp 18    Ht 4' 9"  (1.448 m)    Wt 114 lb (51.7 kg)    BMI 24.67 kg/m    Physical Exam HENT:     Ears:     Comments: Hearing aids in place    Mouth/Throat:     Mouth: Mucous membranes are moist.     Pharynx: Oropharynx is clear.  Eyes:     Conjunctiva/sclera: Conjunctivae normal.  Cardiovascular:     Rate and Rhythm: Normal rate and regular rhythm.  Pulmonary:     Effort: Pulmonary effort is normal.     Breath sounds: Normal breath sounds.  Skin:    General: Skin is warm and dry.     Findings: No rash.  Neurological:     Mental Status: She is alert.     Comments: Head tremor Strength intact in upper and lower extremities  Psychiatric:        Mood and Affect: Mood normal.     Musculoskeletal Exam:  Neck and upper back increased kyphosis Shoulders unable to abduct overhead actively but passive ROM almost fully intact, strength in the tolerable ROM is normal Elbows full ROM no tenderness or swelling Wrists full ROM no tenderness or swelling Fingers full ROM no tenderness or swelling Knees full ROM no swelling Ankles full ROM no tenderness or swelling  Investigation: No additional findings.  Imaging: No results found.  Recent Labs: Lab Results  Component Value Date   WBC 11.1 (H) 08/16/2021   HGB 12.4 08/16/2021   PLT 282.0 08/16/2021   NA 139 08/16/2021   K 3.9 08/16/2021   CL 103 08/16/2021   CO2 29 08/16/2021   GLUCOSE 100 (H) 08/16/2021   BUN 20 08/16/2021   CREATININE 0.86 08/16/2021    BILITOT 0.9 08/16/2021   ALKPHOS 48 08/16/2021   AST 12 08/16/2021   ALT 8 08/16/2021   PROT 6.6 08/16/2021   ALBUMIN 3.8 08/16/2021   CALCIUM 9.0 08/16/2021   GFRAA >60 03/22/2020    Speciality Comments: No specialty comments available.  Procedures:  No procedures performed Allergies: Crestor [rosuvastatin calcium]   Assessment / Plan:     Visit Diagnoses: Sedimentation rate elevation  Bilateral shoulder pain Inflammatory arthritis - Plan: Cyclic citrul peptide antibody, IgG, CK, Serum protein electrophoresis with reflex, IgG, IgA, IgM, predniSONE (DELTASONE) 5 MG tablet  Symptoms are not typical of PMR due to involvement of wrists, knees, sparing of hips, timing associated with COVID illness. However, shoulder exam is highly consistent with bursitis and associated impingement that would be seen for this. Checking CCP Abs, also SPEP and immunoglobulins in case PMR type presentation of  alternate underlying process. Reactive arthritis process could present similarly, although enterococcal UTI infection does not precede all the symptoms. Will continue prednisone 5 mg daily for now, if symptoms remain stable and inflammatory labs trending downward would then try on gradual taper off over next 3 month period.  Post-COVID chronic joint pain  Chronic joint pain associated with COVID infection is possible although her extremely high serum inflammatory markers and sensitivity to low dose prednisone is not necessarily typical.    Type 2 diabetes mellitus without complication, without long-term current use of insulin (HCC)  Discussed risks of steroid use particularly worsening sugar control for diabetic patient but 5 mg daily dose should hopefully not cause much risk of major short term complications.  Urinary frequency - Plan: Urinalysis, Routine w reflex microscopic  Reports urinary frequency that was improved on steroids and worse when off. Could indicate a problem such as interstitial  cystitis but also has had recent UTIs and was on steroid treatment rechecking UA today.  Orders: Orders Placed This Encounter  Procedures   MICROSCOPIC MESSAGE   Cyclic citrul peptide antibody, IgG   CK   Serum protein electrophoresis with reflex   IgG, IgA, IgM   Urinalysis, Routine w reflex microscopic   Meds ordered this encounter  Medications   predniSONE (DELTASONE) 5 MG tablet    Sig: Take 1 tablet (5 mg total) by mouth daily with breakfast.    Dispense:  30 tablet    Refill:  0     Follow-Up Instructions: Return in about 4 weeks (around 09/21/2021) for post-COVID/?PMR on GC f/u 71mo   CCollier Salina MD  Note - This record has been created using [employer]  Chart creation errors have been sought, but may not always  have been located. Such creation errors do not reflect on  the standard of medical care.

## 2021-08-24 ENCOUNTER — Encounter: Payer: Self-pay | Admitting: Internal Medicine

## 2021-08-24 ENCOUNTER — Other Ambulatory Visit: Payer: Self-pay

## 2021-08-24 ENCOUNTER — Ambulatory Visit (INDEPENDENT_AMBULATORY_CARE_PROVIDER_SITE_OTHER): Payer: Medicare HMO | Admitting: Internal Medicine

## 2021-08-24 VITALS — BP 153/74 | HR 84 | Resp 18 | Ht <= 58 in | Wt 114.0 lb

## 2021-08-24 DIAGNOSIS — G8929 Other chronic pain: Secondary | ICD-10-CM | POA: Diagnosis not present

## 2021-08-24 DIAGNOSIS — R700 Elevated erythrocyte sedimentation rate: Secondary | ICD-10-CM | POA: Insufficient documentation

## 2021-08-24 DIAGNOSIS — M1990 Unspecified osteoarthritis, unspecified site: Secondary | ICD-10-CM

## 2021-08-24 DIAGNOSIS — R350 Frequency of micturition: Secondary | ICD-10-CM | POA: Diagnosis not present

## 2021-08-24 DIAGNOSIS — U099 Post covid-19 condition, unspecified: Secondary | ICD-10-CM

## 2021-08-24 DIAGNOSIS — M25512 Pain in left shoulder: Secondary | ICD-10-CM

## 2021-08-24 DIAGNOSIS — E119 Type 2 diabetes mellitus without complications: Secondary | ICD-10-CM

## 2021-08-24 DIAGNOSIS — M25511 Pain in right shoulder: Secondary | ICD-10-CM

## 2021-08-24 DIAGNOSIS — M2550 Pain in unspecified joint: Secondary | ICD-10-CM

## 2021-08-24 HISTORY — DX: Pain in right shoulder: M25.511

## 2021-08-24 HISTORY — DX: Unspecified osteoarthritis, unspecified site: M19.90

## 2021-08-24 HISTORY — DX: Elevated erythrocyte sedimentation rate: R70.0

## 2021-08-24 HISTORY — DX: Frequency of micturition: R35.0

## 2021-08-24 MED ORDER — PREDNISONE 5 MG PO TABS: 5.0000 mg | ORAL_TABLET | Freq: Every day | ORAL | 0 refills | Status: DC

## 2021-08-24 NOTE — Patient Instructions (Addendum)
I am checking for markers of underlying inflammatory causes for the current joint pains and stiffness. If present these may help select a more specific treatment.  For now I believe low dose prednisone is the most appropriate medication for controlling these symptoms. We will need to get off this medication in the future due to side effect risk but I would like to see further resolution of joint pains and inflammatory markers before tapering this off gradually.  I am checking a urinalysis looking for evidence of infection or inflammation causing increased urinary frequency.

## 2021-08-27 LAB — CK: Total CK: 37 U/L (ref 29–143)

## 2021-08-27 LAB — URINALYSIS, ROUTINE W REFLEX MICROSCOPIC
Bacteria, UA: NONE SEEN /HPF
Bilirubin Urine: NEGATIVE
Glucose, UA: NEGATIVE
Hgb urine dipstick: NEGATIVE
Hyaline Cast: NONE SEEN /LPF
Ketones, ur: NEGATIVE
Nitrite: NEGATIVE
Protein, ur: NEGATIVE
Specific Gravity, Urine: 1.021 (ref 1.001–1.035)
pH: 6 (ref 5.0–8.0)

## 2021-08-27 LAB — PROTEIN ELECTROPHORESIS, SERUM, WITH REFLEX
Albumin ELP: 3.7 g/dL — ABNORMAL LOW (ref 3.8–4.8)
Alpha 1: 0.4 g/dL — ABNORMAL HIGH (ref 0.2–0.3)
Alpha 2: 1.2 g/dL — ABNORMAL HIGH (ref 0.5–0.9)
Beta 2: 0.5 g/dL (ref 0.2–0.5)
Beta Globulin: 0.5 g/dL (ref 0.4–0.6)
Gamma Globulin: 1 g/dL (ref 0.8–1.7)
Total Protein: 7.3 g/dL (ref 6.1–8.1)

## 2021-08-27 LAB — CYCLIC CITRUL PEPTIDE ANTIBODY, IGG: Cyclic Citrullin Peptide Ab: <16 UNITS

## 2021-08-27 LAB — IGG, IGA, IGM
IgG (Immunoglobin G), Serum: 1097 mg/dL (ref 600–1540)
IgM, Serum: 57 mg/dL (ref 50–300)
Immunoglobulin A: 313 mg/dL (ref 70–320)

## 2021-08-27 LAB — MICROSCOPIC MESSAGE

## 2021-08-27 NOTE — Progress Notes (Signed)
Urinalysis is completely negative so I don't know why she is having increased frequency. Other lab results still pending for evaluating the joint pain.

## 2021-08-28 ENCOUNTER — Encounter: Payer: Self-pay | Admitting: Internal Medicine

## 2021-08-28 NOTE — Progress Notes (Signed)
Lab results look normal no abnormal markers for rheumatoid arthritis or for muscle inflammation. She can continue the low dose prednisone treatment for now and we will follow up for her progress.

## 2021-09-02 ENCOUNTER — Encounter: Payer: Self-pay | Admitting: Medical

## 2021-09-03 NOTE — Telephone Encounter (Signed)
Please advise and pt just had a UA on 08/24/21

## 2021-09-07 DIAGNOSIS — H8111 Benign paroxysmal vertigo, right ear: Secondary | ICD-10-CM | POA: Diagnosis not present

## 2021-09-07 DIAGNOSIS — Z7409 Other reduced mobility: Secondary | ICD-10-CM | POA: Diagnosis not present

## 2021-09-10 ENCOUNTER — Ambulatory Visit: Payer: Medicare HMO | Admitting: Medical

## 2021-09-11 ENCOUNTER — Institutional Professional Consult (permissible substitution): Payer: Medicare HMO | Admitting: Pulmonary Disease

## 2021-09-12 DIAGNOSIS — H401132 Primary open-angle glaucoma, bilateral, moderate stage: Secondary | ICD-10-CM

## 2021-09-12 HISTORY — DX: Primary open-angle glaucoma, bilateral, moderate stage: H40.1132

## 2021-09-19 ENCOUNTER — Other Ambulatory Visit: Payer: Self-pay | Admitting: Medical

## 2021-09-20 NOTE — Progress Notes (Deleted)
Office Visit Note  Patient: Leslie Ward             Date of Birth: 11-07-1930           MRN: [id number]             PCP: Elise Benne Referring: Elise Benne Visit Date: 09/21/2021   Subjective:  No chief complaint on file.   History of Present Illness: Leslie Ward is a 88 y.o. female here for follow up for multiple joint pains possible PMR or reactive or post COVID inflammation on prednisone 5 mg daily. ***   Previous HPI 08/24/21 Leslie Ward is a 88 y.o. female here for evaluation of joint pain involving neck, shoulders, and wrists with elevated sedimentation rate.  She was ill with COVID in August of last year without severe acute complications and responded well to treatment with pack Slo-Bid.  However afterwards developed severe joint pain and stiffness in multiple areas worst affected in her neck shoulders and knees.  Her joint pains are worst first thing in the morning with a partial degree of improvement during the day.  She never noticed obvious visible swelling or overlying skin changes in affected areas. Besides the joint pains she is experiencing vertigo symptoms since her illness. She has some chronic osteoarthritis changes and dystonia with previous neck botox injections but nothing similar to the current symptoms. Imaging did not demonstrate any obvious related changes lab results showing very highly elevated ESR and CRP. She also developed probably UTI with E. Faecalis on urine culture which was treated during this time. Last month she resumed on prednisone at 5 mg daily dose which was controlling symptoms well but stopped it this week prior to our visit. She notices symptoms come back within about 2 days after stopping any steroids.   No Rheumatology ROS completed.   PMFS History:  Patient Active Problem List   Diagnosis Date Noted   Sedimentation rate elevation 08/24/2021   Bilateral shoulder pain 08/24/2021   Inflammatory arthritis  08/24/2021   Urinary frequency 08/24/2021   COVID 05/29/2021   Post-COVID chronic fatigue 05/29/2021   Post-COVID chronic joint pain 05/29/2021   Post-COVID chronic muscle pain 05/29/2021   Thyroid disease    Ganglion cyst 08/25/2020   Mixed incontinence 03/15/2020   Urgency incontinence 03/15/2020   Vertigo 12/01/2019   Cervical dystonia 03/08/2019   Hyperlipidemia 02/11/2019   Edema 02/11/2019   Hypertension 01/19/2019   Type 2 diabetes mellitus ([place]) 01/19/2019   Hypothyroidism (acquired) 01/19/2019   Neck pain 01/19/2019   Dystonia 01/19/2019   Adhesive capsulitis of left shoulder 12/22/2018    Past Medical History:  Diagnosis Date   Dystonia    Glaucoma    Hypertension    Thyroid disease     Family History  Problem Relation Age of Onset   Stroke Mother    Heart attack Father    Heart Problems Brother    Past Surgical History:  Procedure Laterality Date   NO PAST SURGERIES     Social History   Social History Narrative   Lives alone.   Right-handed.   No caffeine use.   Immunization History  Administered Date(s) Administered   Fluad Quad(high Dose 65+) 06/11/2019, 06/13/2020   Moderna SARS-COV2 Booster Vaccination 07/21/2020   PFIZER(Purple Top)SARS-COV-2 Vaccination 12/20/2019, 01/10/2020     Objective: Vital Signs: There were no vitals taken for this visit.   Physical Exam   Musculoskeletal Exam: ***  CDAI Exam: CDAI Score: --  Patient Global: --; Provider Global: -- Swollen: --; Tender: -- Joint Exam 09/21/2021   No joint exam has been documented for this visit   There is currently no information documented on the homunculus. Go to the Rheumatology activity and complete the homunculus joint exam.  Investigation: No additional findings.  Imaging: No results found.  Recent Labs: Lab Results  Component Value Date   WBC 11.1 (H) 08/16/2021   HGB 12.4 08/16/2021   PLT 282.0 08/16/2021   NA 139 08/16/2021   K 3.9 08/16/2021   CL 103  08/16/2021   CO2 29 08/16/2021   GLUCOSE 100 (H) 08/16/2021   BUN 20 08/16/2021   CREATININE 0.86 08/16/2021   BILITOT 0.9 08/16/2021   ALKPHOS 48 08/16/2021   AST 12 08/16/2021   ALT 8 08/16/2021   PROT 7.3 08/24/2021   ALBUMIN 3.8 08/16/2021   CALCIUM 9.0 08/16/2021   GFRAA >60 03/22/2020    Speciality Comments: No specialty comments available.  Procedures:  No procedures performed Allergies: Crestor [rosuvastatin calcium]   Assessment / Plan:     Visit Diagnoses: No diagnosis found.  ***  Orders: No orders of the defined types were placed in this encounter.  No orders of the defined types were placed in this encounter.    Follow-Up Instructions: No follow-ups on file.   Collier Salina, MD  Note - This record has been created using [employer].  Chart creation errors have been sought, but may not always  have been located. Such creation errors do not reflect on  the standard of medical care.

## 2021-09-21 ENCOUNTER — Ambulatory Visit: Payer: Medicare HMO | Admitting: Internal Medicine

## 2021-09-21 DIAGNOSIS — H8111 Benign paroxysmal vertigo, right ear: Secondary | ICD-10-CM | POA: Diagnosis not present

## 2021-09-21 DIAGNOSIS — Z7409 Other reduced mobility: Secondary | ICD-10-CM | POA: Diagnosis not present

## 2021-09-25 ENCOUNTER — Encounter: Payer: Self-pay | Admitting: Medical

## 2021-09-25 DIAGNOSIS — N3281 Overactive bladder: Secondary | ICD-10-CM | POA: Diagnosis not present

## 2021-09-25 DIAGNOSIS — R3129 Other microscopic hematuria: Secondary | ICD-10-CM | POA: Diagnosis not present

## 2021-09-25 DIAGNOSIS — N3941 Urge incontinence: Secondary | ICD-10-CM | POA: Diagnosis not present

## 2021-09-26 ENCOUNTER — Other Ambulatory Visit: Payer: Self-pay | Admitting: Internal Medicine

## 2021-09-26 DIAGNOSIS — G8929 Other chronic pain: Secondary | ICD-10-CM

## 2021-09-26 DIAGNOSIS — R700 Elevated erythrocyte sedimentation rate: Secondary | ICD-10-CM

## 2021-09-26 DIAGNOSIS — M25512 Pain in left shoulder: Secondary | ICD-10-CM

## 2021-09-26 MED ORDER — PREDNISONE 5 MG PO TABS: 5.0000 mg | ORAL_TABLET | Freq: Every day | ORAL | 0 refills | Status: DC

## 2021-09-26 NOTE — Telephone Encounter (Signed)
Please schedule patient for a follow up visit. Patient is due February 2023. Thanks!

## 2021-09-26 NOTE — Telephone Encounter (Signed)
Next Visit: Due February 2023 (patient no showed 09/21/2021.   Last Visit: 08/24/2021  Last Fill: 08/24/2021  Dx: Sedimentation rate elevation  Bilateral shoulder pain Inflammatory arthritis  Current Dose per office note on 08/24/2021: prednisone 5 mg daily for now  Okay to refill Prednisone?

## 2021-09-27 NOTE — Telephone Encounter (Signed)
LMOM for patient to schedule follow up appointment. °

## 2021-09-28 DIAGNOSIS — Z7409 Other reduced mobility: Secondary | ICD-10-CM | POA: Diagnosis not present

## 2021-09-28 DIAGNOSIS — H8111 Benign paroxysmal vertigo, right ear: Secondary | ICD-10-CM | POA: Diagnosis not present

## 2021-10-01 ENCOUNTER — Ambulatory Visit (INDEPENDENT_AMBULATORY_CARE_PROVIDER_SITE_OTHER): Payer: Medicare HMO | Admitting: Medical

## 2021-10-01 ENCOUNTER — Ambulatory Visit: Payer: Medicare HMO

## 2021-10-01 ENCOUNTER — Ambulatory Visit (INDEPENDENT_AMBULATORY_CARE_PROVIDER_SITE_OTHER): Payer: Medicare HMO

## 2021-10-01 ENCOUNTER — Telehealth: Payer: Self-pay

## 2021-10-01 VITALS — BP 128/62 | HR 64 | Temp 98.1°F | Resp 16 | Ht <= 58 in | Wt 117.0 lb

## 2021-10-01 DIAGNOSIS — H539 Unspecified visual disturbance: Secondary | ICD-10-CM | POA: Diagnosis not present

## 2021-10-01 DIAGNOSIS — Z0000 Encounter for general adult medical examination without abnormal findings: Secondary | ICD-10-CM | POA: Diagnosis not present

## 2021-10-01 DIAGNOSIS — R519 Headache, unspecified: Secondary | ICD-10-CM

## 2021-10-01 MED ORDER — MECLIZINE HCL 12.5 MG PO TABS: 12.5000 mg | ORAL_TABLET | Freq: Three times a day (TID) | ORAL | 0 refills | Status: DC | PRN

## 2021-10-01 NOTE — Patient Instructions (Addendum)
Recent new intermittent moderate daily ha  with head pressure and vision changes on turning head.   Rx meclizine to take if dizziness were to persist.  Continue prednisone 5 mg daily.  Ct head order placed. Will see if need prior authorization.  Ask son to call ophthalmologist office to see if they can see her to recheck eye pressure as some vision changes recently on turning head.  We will follow CT of head results and then try to get you expedited appointment with your neurologist.  If unable to get a quick appointment then might go ahead and try to order MRI.  If you have dizziness with motor or sensory function deficits then be seen in the emergency department.  Follow-up date to be determined after CT imaging review.

## 2021-10-01 NOTE — Telephone Encounter (Signed)
Patient has an appt at 9:00 this morning for her Medicare wellness exam & also has an appt with PCP at 4:00 today. Left message for patient's son (per patient's request) to see if they would like to switch the 9:00  apptto 3:00 so they do not have to come to the office twice today.

## 2021-10-01 NOTE — Progress Notes (Deleted)
Subjective:   Leslie Ward is a 88 y.o. female who presents for an Initial Medicare Annual Wellness Visit.  Review of Systems    ***       Objective:    There were no vitals filed for this visit. There is no height or weight on file to calculate BMI.  Advanced Directives 03/20/2021 09/26/2020 03/22/2020 03/02/2020 12/21/2019 11/30/2019 01/31/2018  Does Patient Have a Medical Advance Directive? No Yes Yes No No No No  Type of Advance Directive - [place];Living will - - - - -  Does patient want to make changes to medical advance directive? - No - Patient declined - - - - -  Copy of [place] in Chart? - Yes - validated most recent copy scanned in chart (See row information) - - - - -  Would patient like information on creating a medical advance directive? No - Patient declined - - - No - Patient declined No - Patient declined No - Patient declined    Current Medications (verified) Outpatient Encounter Medications as of 10/01/2021  Medication Sig   amLODipine (NORVASC) 5 MG tablet TAKE 1 TABLET(5 MG) BY MOUTH TWICE DAILY   blood glucose meter kit and supplies Dispense based on patient and insurance preference. Use up to four times daily as directed. (FOR ICD-10 E10.9, E11.9). (Patient not taking: Reported on 08/24/2021)   Calcium Carbonate (CALCIUM 600 PO) Take 2 tablets by mouth daily.   Calcium Polycarbophil (FIBER-CAPS PO) Take 1 capsule by mouth 2 (two) times a day.  (Patient not taking: Reported on 08/24/2021)   Cinnamon 500 MG capsule Take 1,000 mg by mouth daily.   dorzolamide-timolol (COSOPT) 22.3-6.8 MG/ML ophthalmic solution Place 1 drop into both eyes 2 times daily.   fluticasone (FLONASE) 50 MCG/ACT nasal spray Place 2 sprays into both nostrils daily.   gabapentin (NEURONTIN) 100 MG capsule Take 1 capsule (100 mg total) by mouth at bedtime. (Patient not taking: Reported on 08/24/2021)   incobotulinumtoxinA (XEOMIN) 100 units SOLR injection  Inject 100 Units into the muscle every 3 (three) months. (Patient not taking: Reported on 08/24/2021)   Lancets (ONETOUCH DELICA PLUS [id number]) MISC USE TO CHECK BLOOD SUGAR UP TO FOUR TIMES DAILY AS DIRECTED   levothyroxine (SYNTHROID) 50 MCG tablet TAKE ONE TABLET BY MOUTH DAILY BEFORE BREAKFAST   meclizine (ANTIVERT) 12.5 MG tablet Take 1 tablet (12.5 mg total) by mouth 3 (three) times daily as needed for dizziness. (Patient not taking: Reported on 08/24/2021)   MELATONIN PO Take 2-3 tablets by mouth at bedtime as needed (sleep).    Menaquinone-7 (VITAMIN K2 PO) Take 1 tablet by mouth at bedtime.   metoprolol tartrate (LOPRESSOR) 50 MG tablet TAKE ONE TABLET BY MOUTH TWICE A DAY   Multiple Vitamin (MULTIVITAMIN) tablet Take 1 tablet by mouth daily.   Omega-3 Fatty Acids (OMEGA 3 PO) Take 2 tablets by mouth every evening.    ONETOUCH ULTRA test strip  (Patient not taking: Reported on 08/24/2021)   Phytosterol Esters (CHOLEST CARE PO) Take 1,600 mg by mouth daily.   predniSONE (DELTASONE) 5 MG tablet Take 1 tablet (5 mg total) by mouth daily with breakfast.   Probiotic Product (PROBIOTIC DAILY PO) Take 1 tablet by mouth daily.   telmisartan (MICARDIS) 20 MG tablet Take 1 tablet (20 mg total) by mouth daily.   timolol (TIMOPTIC) 0.5 % ophthalmic solution SMARTSIG:In Eye(s)   vitamin C (ASCORBIC ACID) 500 MG tablet Take 500 mg by mouth  every evening.    No facility-administered encounter medications on file as of 10/01/2021.    Allergies (verified) Crestor [rosuvastatin calcium]   History: Past Medical History:  Diagnosis Date   Dystonia    Glaucoma    Hypertension    Thyroid disease    Past Surgical History:  Procedure Laterality Date   NO PAST SURGERIES     Family History  Problem Relation Age of Onset   Stroke Mother    Heart attack Father    Heart Problems Brother    Social History   Socioeconomic History   Marital status: Widowed    Spouse name: Not on file   Number of  children: 1   Years of education: 6 or 7 years of education   Highest education level: Not on file  Occupational History   Occupation: Retired  Tobacco Use   Smoking status: Never   Smokeless tobacco: Never  Vaping Use   Vaping Use: Never used  Substance and Sexual Activity   Alcohol use: No   Drug use: No   Sexual activity: Not on file  Other Topics Concern   Not on file  Social History Narrative   Lives alone.   Right-handed.   No caffeine use.   Social Determinants of Health   Financial Resource Strain: Not on file  Food Insecurity: Not on file  Transportation Needs: Not on file  Physical Activity: Not on file  Stress: Not on file  Social Connections: Not on file    Tobacco Counseling Counseling given: Not Answered   Clinical Intake:                 Diabetes:  Is the patient diabetic?  Yes  If diabetic, was a CBG obtained today?  No  Did the patient bring in their glucometer from home?  No  How often do you monitor your CBG's? ***.   Financial Strains and Diabetes Management:  Are you having any financial strains with the device, your supplies or your medication? {YES/NO:21197}.  Does the patient want to be seen by Chronic Care Management for management of their diabetes?  {YES/NO:21197} Would the patient like to be referred to a Nutritionist or for Diabetic Management?  {YES/NO:21197}  Diabetic Exams:  Diabetic Eye Exam: Completed ***. Overdue for diabetic eye exam. Pt has been advised about the importance in completing this exam. A referral has been placed today. Message sent to referral coordinator for scheduling purposes. Advised pt to expect a call from our office re: appt.  Diabetic Foot Exam: Completed ***. Pt has been advised about the importance in completing this exam. Pt is scheduled for diabetic foot exam on ***.           Activities of Daily Living In your present state of health, do you have any difficulty performing the  following activities: 05/18/2021  Hearing? N  Vision? N  Difficulty concentrating or making decisions? N  Walking or climbing stairs? N  Dressing or bathing? N  Doing errands, shopping? Y  Some recent data might be hidden    Patient Care Team: Saguier, Iris Pert as PCP - General (Internal Medicine) Richardo Priest, MD as PCP - Cardiology (Cardiology)  Indicate any recent Medical Services you may have received from other than [facility] providers in the past year (date may be approximate).     Assessment:   This is a routine wellness examination for [facility].  Hearing/Vision screen No results found.  Dietary issues and exercise activities  discussed:     Goals Addressed   None    Depression Screen PHQ 2/9 Scores 05/29/2021 05/18/2021 10/15/2019  PHQ - 2 Score 0 0 0    Fall Risk Fall Risk  05/29/2021 05/18/2021  Falls in the past year? 1 0  Number falls in past yr: 0 0  Injury with Fall? 0 0  Follow up - Falls evaluation completed    FALL RISK PREVENTION PERTAINING TO THE HOME:  Any stairs in or around the home? {YES/NO:21197} If so, are there any without handrails? {YES/NO:21197} Home free of loose throw rugs in walkways, pet beds, electrical cords, etc? {YES/NO:21197} Adequate lighting in your home to reduce risk of falls? {YES/NO:21197}  ASSISTIVE DEVICES UTILIZED TO PREVENT FALLS:  Life alert? {YES/NO:21197} Use of a cane, walker or w/c? {YES/NO:21197} Grab bars in the bathroom? {YES/NO:21197} Shower chair or bench in shower? {YES/NO:21197} Elevated toilet seat or a handicapped toilet? {YES/NO:21197}  TIMED UP AND GO:  Was the test performed? {YES/NO:21197}.  Length of time to ambulate 10 feet: *** sec.   {Appearance of AXKP:5374827}  Cognitive Function:        Immunizations Immunization History  Administered Date(s) Administered   Fluad Quad(high Dose 65+) 06/11/2019, 06/13/2020   Moderna SARS-COV2 Booster Vaccination 07/21/2020   PFIZER(Purple  Top)SARS-COV-2 Vaccination 12/20/2019, 01/10/2020    TDAP status: Due, Education has been provided regarding the importance of this vaccine. Advised may receive this vaccine at local pharmacy or Health Dept. Aware to provide a copy of the vaccination record if obtained from local pharmacy or Health Dept. Verbalized acceptance and understanding.  {Flu Vaccine status:2101806}  {Pneumococcal vaccine status:2101807}  {Covid-19 vaccine status:2101808}  Qualifies for Shingles Vaccine? Yes   Zostavax completed No   Shingrix Completed?: No.    Education has been provided regarding the importance of this vaccine. Patient has been advised to call insurance company to determine out of pocket expense if they have not yet received this vaccine. Advised may also receive vaccine at local pharmacy or Health Dept. Verbalized acceptance and understanding.  Screening Tests Health Maintenance  Topic Date Due   FOOT EXAM  Never done   OPHTHALMOLOGY EXAM  Never done   TETANUS/TDAP  Never done   Zoster Vaccines- Shingrix (1 of 2) Never done   Pneumonia Vaccine 49+ Years old (1 - PCV) Never done   DEXA SCAN  Never done   COVID-19 Vaccine (3 - Booster for Pfizer series) 09/15/2020   INFLUENZA VACCINE  03/12/2021   HEMOGLOBIN A1C  02/13/2022   HPV VACCINES  Aged Out    Health Maintenance  Health Maintenance Due  Topic Date Due   FOOT EXAM  Never done   OPHTHALMOLOGY EXAM  Never done   TETANUS/TDAP  Never done   Zoster Vaccines- Shingrix (1 of 2) Never done   Pneumonia Vaccine 60+ Years old (1 - PCV) Never done   DEXA SCAN  Never done   COVID-19 Vaccine (3 - Booster for Pfizer series) 09/15/2020   INFLUENZA VACCINE  03/12/2021    Colorectal cancer screening: No longer required.   {Mammogram status:21018020}  {Bone Density status:21018021}  Lung Cancer Screening: (Low Dose CT Chest recommended if Age 72-80 years, 30 pack-year currently smoking OR have quit w/in 15years.) does not qualify.      Additional Screening:  Hepatitis C Screening: does not qualify  Vision Screening: Recommended annual ophthalmology exams for early detection of glaucoma and other disorders of the eye. Is the patient up to date with  their annual eye exam?  {YES/NO:21197} Who is the provider or what is the name of the office in which the patient attends annual eye exams? *** If pt is not established with a provider, would they like to be referred to a provider to establish care? {YES/NO:21197}.   Dental Screening: Recommended annual dental exams for proper oral hygiene  Community Resource Referral / Chronic Care Management: CRR required this visit?  {YES/NO:21197}  CCM required this visit?  {YES/NO:21197}     Plan:     I have personally reviewed and noted the following in the patients chart:   Medical and social history Use of alcohol, tobacco or illicit drugs  Current medications and supplements including opioid prescriptions. {Opioid Prescriptions:[phone]} Functional ability and status Nutritional status Physical activity Advanced directives List of other physicians Hospitalizations, surgeries, and ER visits in previous 12 months Vitals Screenings to include cognitive, depression, and falls Referrals and appointments  In addition, I have reviewed and discussed with patient certain preventive protocols, quality metrics, and best practice recommendations. A written personalized care plan for preventive services as well as general preventive health recommendations were provided to patient.     Marta Antu, LPN   2/86/3817  Nurse Health Advisor  Nurse Notes: ***

## 2021-10-01 NOTE — Progress Notes (Signed)
Subjective:    Patient ID: Leslie Ward, female    DOB: [birth year], 88 y.o.   MRN: [id number]  HPI Pt in for follow up.  Pt has been dizziness for months.    Pt has been seen by Dr. Sabino Snipes ENT 08/23/2021 and has been to PT. DX with BPPV. But is not improving with Epley maneuvers.   Pt later in interview tells son and myself that her dizziness/vertigo stopped 2 weeks ago but now mostly has ha, head pressure most often when she turns her head. Pain often transient but can be moderate to severe. Sometimes vision changes.  Also has seen neurologist in past. By [place] neurologist. Dec 8 , 2021. Saw Dr. Krista Blue.   Pt has pain inside of head when she turns her head will have pain and will have vision change and loose strength. This will pass and then come later.   Also some vision changes. At times vision like shadow  She describes more pressure.  Review of Systems  Constitutional:  Negative for chills, fatigue and fever.  Eyes:  Positive for visual disturbance.  Respiratory:  Negative for cough, choking, chest tightness, shortness of breath and wheezing.   Cardiovascular:  Negative for chest pain and palpitations.  Gastrointestinal:  Negative for abdominal pain.  Musculoskeletal:  Negative for back pain, gait problem and joint swelling.  Neurological:  Positive for headaches. Negative for dizziness, seizures, syncope and weakness.       Some pressure.  Hematological:  Negative for adenopathy. Does not bruise/bleed easily.  Psychiatric/Behavioral:  Negative for behavioral problems, decreased concentration and dysphoric mood.     Past Medical History:  Diagnosis Date   Dystonia    Glaucoma    Hypertension    Thyroid disease      Social History   Socioeconomic History   Marital status: Widowed    Spouse name: Not on file   Number of children: 1   Years of education: 6 or 7 years of education   Highest education level: Not on file  Occupational History   Occupation:  Retired  Tobacco Use   Smoking status: Never   Smokeless tobacco: Never  Vaping Use   Vaping Use: Never used  Substance and Sexual Activity   Alcohol use: No   Drug use: No   Sexual activity: Not on file  Other Topics Concern   Not on file  Social History Narrative   Lives alone.   Right-handed.   No caffeine use.   Social Determinants of Health   Financial Resource Strain: Low Risk    Difficulty of Paying Living Expenses: Not hard at all  Food Insecurity: No Food Insecurity   Worried About Charity fundraiser in the Last Year: Never true   [place] in the Last Year: Never true  Transportation Needs: No Transportation Needs   Lack of Transportation (Medical): No   Lack of Transportation (Non-Medical): No  Physical Activity: Inactive   Days of Exercise per Week: 0 days   Minutes of Exercise per Session: 0 min  Stress: No Stress Concern Present   Feeling of Stress : Not at all  Social Connections: Not on file  Intimate Partner Violence: Not on file    Past Surgical History:  Procedure Laterality Date   NO PAST SURGERIES      Family History  Problem Relation Age of Onset   Stroke Mother    Heart attack Father    Heart Problems Brother  Allergies  Allergen Reactions   Crestor [Rosuvastatin Calcium] Other (See Comments)    "caused me to go into kidney failure"    Current Outpatient Medications on File Prior to Visit  Medication Sig Dispense Refill   amLODipine (NORVASC) 5 MG tablet TAKE 1 TABLET(5 MG) BY MOUTH TWICE DAILY 180 tablet 2   blood glucose meter kit and supplies Dispense based on patient and insurance preference. Use up to four times daily as directed. (FOR ICD-10 E10.9, E11.9). (Patient not taking: Reported on 08/24/2021) 1 each 0   Calcium Carbonate (CALCIUM 600 PO) Take 2 tablets by mouth daily.     Calcium Polycarbophil (FIBER-CAPS PO) Take 1 capsule by mouth 2 (two) times a day.  (Patient not taking: Reported on 08/24/2021)     Cinnamon  500 MG capsule Take 1,000 mg by mouth daily.     dorzolamide-timolol (COSOPT) 22.3-6.8 MG/ML ophthalmic solution Place 1 drop into both eyes 2 times daily.     fluticasone (FLONASE) 50 MCG/ACT nasal spray Place 2 sprays into both nostrils daily. 16 g 1   gabapentin (NEURONTIN) 100 MG capsule Take 1 capsule (100 mg total) by mouth at bedtime. (Patient not taking: Reported on 08/24/2021) 30 capsule 2   incobotulinumtoxinA (XEOMIN) 100 units SOLR injection Inject 100 Units into the muscle every 3 (three) months. (Patient not taking: Reported on 08/24/2021)     Lancets (ONETOUCH DELICA PLUS [id number]) MISC USE TO CHECK BLOOD SUGAR UP TO FOUR TIMES DAILY AS DIRECTED 100 each 1   levothyroxine (SYNTHROID) 50 MCG tablet TAKE ONE TABLET BY MOUTH DAILY BEFORE BREAKFAST 90 tablet 0   meclizine (ANTIVERT) 12.5 MG tablet Take 1 tablet (12.5 mg total) by mouth 3 (three) times daily as needed for dizziness. (Patient not taking: Reported on 08/24/2021) 20 tablet 0   MELATONIN PO Take 2-3 tablets by mouth at bedtime as needed (sleep).      Menaquinone-7 (VITAMIN K2 PO) Take 1 tablet by mouth at bedtime.     metoprolol tartrate (LOPRESSOR) 50 MG tablet TAKE ONE TABLET BY MOUTH TWICE A DAY 180 tablet 1   Multiple Vitamin (MULTIVITAMIN) tablet Take 1 tablet by mouth daily.     Omega-3 Fatty Acids (OMEGA 3 PO) Take 2 tablets by mouth every evening.      ONETOUCH ULTRA test strip  (Patient not taking: Reported on 08/24/2021)     Phytosterol Esters (CHOLEST CARE PO) Take 1,600 mg by mouth daily.     predniSONE (DELTASONE) 5 MG tablet Take 1 tablet (5 mg total) by mouth daily with breakfast. 30 tablet 0   Probiotic Product (PROBIOTIC DAILY PO) Take 1 tablet by mouth daily.     telmisartan (MICARDIS) 20 MG tablet Take 1 tablet (20 mg total) by mouth daily. 90 tablet 3   timolol (TIMOPTIC) 0.5 % ophthalmic solution SMARTSIG:In Eye(s)     vitamin C (ASCORBIC ACID) 500 MG tablet Take 500 mg by mouth every evening.      No  current facility-administered medications on file prior to visit.    There were no vitals taken for this visit.   Bp 128/62. Pulse 64    Objective:   Physical Exam  General Mental Status- Alert. General Appearance- Not in acute distress.   Skin General: Color- Normal Color. Moisture- Normal Moisture.  Neck Carotid Arteries- Normal color. Moisture- Normal Moisture. No carotid bruits. No JVD.  Chest and Lung Exam Auscultation: Breath Sounds:-Normal.  Cardiovascular Auscultation:Rythm- Regular. Murmurs & Other Heart Sounds:Auscultation of the heart  reveals- No Murmurs.  Abdomen Inspection:-Inspeection Normal. Palpation/Percussion:Note:No mass. Palpation and Percussion of the abdomen reveal- Non Tender, Non Distended + BS, no rebound or guarding.    Neurologic Cranial Nerve exam:- CN III-XII intact(No nystagmus), symmetric smile. Drift Test:- No drift. Strength:- 5/5 equal and symmetric strength both upper and lower extremities.       Assessment & Plan:     Patient Instructions  Recent new intermittent moderate daily ha  with head pressure and vision changes on turning head.   Rx meclizine to take if dizziness were to persist.  Continue prednisone 5 mg daily.  Ct head order placed. Will see if need prior authorization.  Ask son to call ophthalmologist office to see if they can see her to recheck eye pressure as some vision changes recently on turning head.  We will follow CT of head results and then try to get you expedited appointment with your neurologist.  If unable to get a quick appointment then might go ahead and try to order MRI.  If you have dizziness with motor or sensory function deficits then be seen in the emergency department.  Follow-up date to be determined after CT imaging review.  Mackie Pai, PA-C    Time spent with patient today was 42  minutes which consisted of chart review, discussing diagnosis, work up, treatment, answering questions  and documentation.

## 2021-10-01 NOTE — Progress Notes (Addendum)
Subjective:   Leslie Ward is a 88 y.o. female who presents for an Initial Medicare Annual Wellness Visit.  Review of Systems     Cardiac Risk Factors include: advanced age (>22mn, >>59women);hypertension;dyslipidemia;diabetes mellitus     Objective:    Today's Vitals   10/01/21 1500  BP: 128/62  Pulse: 64  Resp: 16  Temp: 98.1 F (36.7 C)  TempSrc: Oral  SpO2: 98%  Weight: 117 lb (53.1 kg)  Height: 4' 9" (1.448 m)   Body mass index is 25.32 kg/m.  Advanced Directives 10/01/2021 03/20/2021 09/26/2020 03/22/2020 03/02/2020 12/21/2019 11/30/2019  Does Patient Have a Medical Advance Directive? Yes No Yes Yes No No No  Type of [place] - - - -  Does patient want to make changes to medical advance directive? - - No - Patient declined - - - -  Copy of [place] Chart? No - copy requested - Yes - validated most recent copy scanned in chart (See row information) - - - -  Would patient like information on creating a medical advance directive? - No - Patient declined - - - No - Patient declined No - Patient declined    Current Medications (verified) Outpatient Encounter Medications as of 10/01/2021  Medication Sig   amLODipine (NORVASC) 5 MG tablet TAKE 1 TABLET(5 MG) BY MOUTH TWICE DAILY   Calcium Carbonate (CALCIUM 600 PO) Take 2 tablets by mouth daily.   Cinnamon 500 MG capsule Take 1,000 mg by mouth daily.   dorzolamide-timolol (COSOPT) 22.3-6.8 MG/ML ophthalmic solution Place 1 drop into both eyes 2 times daily.   fluticasone (FLONASE) 50 MCG/ACT nasal spray Place 2 sprays into both nostrils daily.   Lancets (ONETOUCH DELICA PLUS [id number] MISC USE TO CHECK BLOOD SUGAR UP TO FOUR TIMES DAILY AS DIRECTED   levothyroxine (SYNTHROID) 50 MCG tablet TAKE ONE TABLET BY MOUTH DAILY BEFORE BREAKFAST   MELATONIN PO Take 2-3 tablets by mouth at bedtime as needed (sleep).     Menaquinone-7 (VITAMIN K2 PO) Take 1 tablet by mouth at bedtime.   metoprolol tartrate (LOPRESSOR) 50 MG tablet TAKE ONE TABLET BY MOUTH TWICE A DAY   Multiple Vitamin (MULTIVITAMIN) tablet Take 1 tablet by mouth daily.   Omega-3 Fatty Acids (OMEGA 3 PO) Take 2 tablets by mouth every evening.    Phytosterol Esters (CHOLEST CARE PO) Take 1,600 mg by mouth daily.   predniSONE (DELTASONE) 5 MG tablet Take 1 tablet (5 mg total) by mouth daily with breakfast.   Probiotic Product (PROBIOTIC DAILY PO) Take 1 tablet by mouth daily.   telmisartan (MICARDIS) 20 MG tablet Take 1 tablet (20 mg total) by mouth daily.   timolol (TIMOPTIC) 0.5 % ophthalmic solution SMARTSIG:In Eye(s)   vitamin C (ASCORBIC ACID) 500 MG tablet Take 500 mg by mouth every evening.    blood glucose meter kit and supplies Dispense based on patient and insurance preference. Use up to four times daily as directed. (FOR ICD-10 E10.9, E11.9). (Patient not taking: Reported on 08/24/2021)   Calcium Polycarbophil (FIBER-CAPS PO) Take 1 capsule by mouth 2 (two) times a day.  (Patient not taking: Reported on 08/24/2021)   gabapentin (NEURONTIN) 100 MG capsule Take 1 capsule (100 mg total) by mouth at bedtime. (Patient not taking: Reported on 08/24/2021)   incobotulinumtoxinA (XEOMIN) 100 units SOLR injection Inject 100 Units into the muscle every 3 (three) months. (Patient not taking: Reported  on 08/24/2021)   meclizine (ANTIVERT) 12.5 MG tablet Take 1 tablet (12.5 mg total) by mouth 3 (three) times daily as needed for dizziness. (Patient not taking: Reported on 08/24/2021)   ONETOUCH ULTRA test strip  (Patient not taking: Reported on 08/24/2021)   No facility-administered encounter medications on file as of 10/01/2021.    Allergies (verified) Crestor [rosuvastatin calcium]   History: Past Medical History:  Diagnosis Date   Dystonia    Glaucoma    Hypertension    Thyroid disease    Past Surgical History:  Procedure Laterality Date   NO  PAST SURGERIES     Family History  Problem Relation Age of Onset   Stroke Mother    Heart attack Father    Heart Problems Brother    Social History   Socioeconomic History   Marital status: Widowed    Spouse name: Not on file   Number of children: 1   Years of education: 6 or 7 years of education   Highest education level: Not on file  Occupational History   Occupation: Retired  Tobacco Use   Smoking status: Never   Smokeless tobacco: Never  Vaping Use   Vaping Use: Never used  Substance and Sexual Activity   Alcohol use: No   Drug use: No   Sexual activity: Not on file  Other Topics Concern   Not on file  Social History Narrative   Lives alone.   Right-handed.   No caffeine use.   Social Determinants of Health   Financial Resource Strain: Low Risk    Difficulty of Paying Living Expenses: Not hard at all  Food Insecurity: No Food Insecurity   Worried About Charity fundraiser in the Last Year: Never true   [place] in the Last Year: Never true  Transportation Needs: No Transportation Needs   Lack of Transportation (Medical): No   Lack of Transportation (Non-Medical): No  Physical Activity: Inactive   Days of Exercise per Week: 0 days   Minutes of Exercise per Session: 0 min  Stress: No Stress Concern Present   Feeling of Stress : Not at all  Social Connections: Not on file    Tobacco Counseling Counseling given: Not Answered   Clinical Intake:     Pain : No/denies pain     BMI - recorded: 25.32 Nutritional Status: BMI 25 -29 Overweight Nutritional Risks: None Diabetes: Yes CBG done?: No Did pt. bring in CBG monitor from home?: No  How often do you need to have someone help you when you read instructions, pamphlets, or other written materials from your doctor or pharmacy?: 1 - Never  Diabetes:  Is the patient diabetic?  Yes  If diabetic, was a CBG obtained today?  No  Did the patient bring in their glucometer from home?  No  How  often do you monitor your CBG's? never.   Financial Strains and Diabetes Management:  Are you having any financial strains with the device, your supplies or your medication? No .  Does the patient want to be seen by Chronic Care Management for management of their diabetes?  No  Would the patient like to be referred to a Nutritionist or for Diabetic Management?  No   Diabetic Exams:  Diabetic Eye Exam: Completed 2 weeks ago-per patient..  Diabetic Foot Exam:  To be completed by PCP.   Interpreter Needed?: No  Information entered by :: Caroleen Hamman LPN   Activities of Daily Living In your  present state of health, do you have any difficulty performing the following activities: 10/01/2021 05/18/2021  Hearing? Y N  Comment hearing aids -  Vision? N N  Difficulty concentrating or making decisions? Y N  Walking or climbing stairs? Y N  Dressing or bathing? N N  Doing errands, shopping? Tempie Donning  Preparing Food and eating ? N -  Using the Toilet? N -  Managing your Medications? N -  Managing your Finances? N -  Housekeeping or managing your Housekeeping? N -  Some recent data might be hidden    Patient Care Team: Saguier, Iris Pert as PCP - General (Internal Medicine) Bettina Gavia Hilton Cork, MD as PCP - Cardiology (Cardiology)  Indicate any recent Medical Services you may have received from other than [facility] providers in the past year (date may be approximate).     Assessment:   This is a routine wellness examination for [facility].  Hearing/Vision screen Hearing Screening - Comments:: Hearing aids Vision Screening - Comments:: Last eye exam -2 weeks ago-Dr. Jacinta Shoe  Dietary issues and exercise activities discussed: Current Exercise Habits: The patient does not participate in regular exercise at present   Goals Addressed             This Visit's Progress    Patient Stated       Drink more water & eat healthier       Depression Screen PHQ 2/9 Scores 10/01/2021 05/29/2021  05/18/2021 10/15/2019  PHQ - 2 Score 0 0 0 0    Fall Risk Fall Risk  10/01/2021 05/29/2021 05/18/2021  Falls in the past year? 1 1 0  Number falls in past yr: 0 0 0  Injury with Fall? 0 0 0  Follow up Falls prevention discussed - Falls evaluation completed    FALL RISK PREVENTION PERTAINING TO THE HOME:  Any stairs in or around the home? No  Home free of loose throw rugs in walkways, pet beds, electrical cords, etc? Yes  Adequate lighting in your home to reduce risk of falls? Yes   ASSISTIVE DEVICES UTILIZED TO PREVENT FALLS:  Life alert? Yes  Use of a cane, walker or w/c? Yes  Grab bars in the bathroom? Yes  Shower chair or bench in shower? No  Elevated toilet seat or a handicapped toilet? No   TIMED UP AND GO:  Was the test performed? Yes .  Length of time to ambulate 10 feet: 12 sec.   Gait slow and steady with assistive device  Cognitive Function:Normal cognitive status assessed by direct observation by this Nurse Health Advisor. No abnormalities found.          Immunizations Immunization History  Administered Date(s) Administered   Fluad Quad(high Dose 65+) 06/11/2019, 06/13/2020   Influenza-Unspecified 07/12/2021   Moderna Covid-19 Vaccine Bivalent Booster 19yr & up 06/12/2021   Moderna SARS-COV2 Booster Vaccination 07/21/2020   PFIZER(Purple Top)SARS-COV-2 Vaccination 12/20/2019, 01/10/2020    TDAP status: Due, Education has been provided regarding the importance of this vaccine. Advised may receive this vaccine at local pharmacy or Health Dept. Aware to provide a copy of the vaccination record if obtained from local pharmacy or Health Dept. Verbalized acceptance and understanding.  Flu Vaccine status: Up to date  Pneumococcal vaccine status: Up to date per patient-dates unknown  Covid-19 vaccine status: Completed vaccines  Qualifies for Shingles Vaccine? Yes   Zostavax completed No   Shingrix Completed?: No.    Education has been provided regarding the  importance of this vaccine. Patient  has been advised to call insurance company to determine out of pocket expense if they have not yet received this vaccine. Advised may also receive vaccine at local pharmacy or Health Dept. Verbalized acceptance and understanding.  Screening Tests Health Maintenance  Topic Date Due   FOOT EXAM  Never done   OPHTHALMOLOGY EXAM  Never done   TETANUS/TDAP  Never done   Zoster Vaccines- Shingrix (1 of 2) Never done   Pneumonia Vaccine 106+ Years old (1 - PCV) Never done   DEXA SCAN  Never done   HEMOGLOBIN A1C  02/13/2022   INFLUENZA VACCINE  Completed   COVID-19 Vaccine  Completed   HPV VACCINES  Aged Out    Health Maintenance  Health Maintenance Due  Topic Date Due   FOOT EXAM  Never done   OPHTHALMOLOGY EXAM  Never done   TETANUS/TDAP  Never done   Zoster Vaccines- Shingrix (1 of 2) Never done   Pneumonia Vaccine 61+ Years old (1 - PCV) Never done   DEXA SCAN  Never done    Colorectal cancer screening: No longer required.   Mammogram status: No longer required due to patient declines.  Bone Density status: Declined  Lung Cancer Screening: (Low Dose CT Chest recommended if Age 15-80 years, 30 pack-year currently smoking OR have quit w/in 15years.) does not qualify.     Additional Screening:  Hepatitis C Screening: does not qualify  Vision Screening: Recommended annual ophthalmology exams for early detection of glaucoma and other disorders of the eye. Is the patient up to date with their annual eye exam?  Yes  Who is the provider or what is the name of the office in which the patient attends annual eye exams? Dr. Jacinta Shoe   Dental Screening: Recommended annual dental exams for proper oral hygiene  Community Resource Referral / Chronic Care Management: CRR required this visit?  No   CCM required this visit?  No      Plan:     I have personally reviewed and noted the following in the patients chart:   Medical and social  history Use of alcohol, tobacco or illicit drugs  Current medications and supplements including opioid prescriptions. Patient is not currently taking opioid prescriptions. Functional ability and status Nutritional status Physical activity Advanced directives List of other physicians Hospitalizations, surgeries, and ER visits in previous 12 months Vitals Screenings to include cognitive, depression, and falls Referrals and appointments  In addition, I have reviewed and discussed with patient certain preventive protocols, quality metrics, and best practice recommendations. A written personalized care plan for preventive services as well as general preventive health recommendations were provided to patient.   Patient to access avs on mychart.  Marta Antu, LPN   7/48/2707  Nurse Health Advisor  Nurse Notes: None  Review and Agree with assessment & plan of LPN   Mackie Pai, PA-C

## 2021-10-01 NOTE — Patient Instructions (Signed)
Leslie Ward , Thank you for taking time to come for your Medicare Wellness Visit. I appreciate your ongoing commitment to your health goals. Please review the following plan we discussed and let me know if I can assist you in the future.   Screening recommendations/referrals: Colonoscopy: No longer required Mammogram: Declined Bone Density: Declined Recommended yearly ophthalmology/optometry visit for glaucoma screening and checkup Recommended yearly dental visit for hygiene and checkup  Vaccinations: Influenza vaccine: Up to date Pneumococcal vaccine: Unsure of vaccine status.Discuss with PCP. Tdap vaccine: May obtain vaccine at your local pharmacy. Shingles vaccine:   May obtain vaccine at your local pharmacy. Covid-19:Up to date  Advanced directives: Please bring a copy of Living Will and/or Healthcare Power of Attorney for your chart.   Conditions/risks identified: See problem list  Next appointment: Follow up in one year for your annual wellness visit    Preventive Care 65 Years and Older, Female Preventive care refers to lifestyle choices and visits with your health care provider that can promote health and wellness. What does preventive care include? A yearly physical exam. This is also called an annual well check. Dental exams once or twice a year. Routine eye exams. Ask your health care provider how often you should have your eyes checked. Personal lifestyle choices, including: Daily care of your teeth and gums. Regular physical activity. Eating a healthy diet. Avoiding tobacco and drug use. Limiting alcohol use. Practicing safe sex. Taking low-dose aspirin every day. Taking vitamin and mineral supplements as recommended by your health care provider. What happens during an annual well check? The services and screenings done by your health care provider during your annual well check will depend on your age, overall health, lifestyle risk factors, and family history  of disease. Counseling  Your health care provider may ask you questions about your: Alcohol use. Tobacco use. Drug use. Emotional well-being. Home and relationship well-being. Sexual activity. Eating habits. History of falls. Memory and ability to understand (cognition). Work and work Statistician. Reproductive health. Screening  You may have the following tests or measurements: Height, weight, and BMI. Blood pressure. Lipid and cholesterol levels. These may be checked every 5 years, or more frequently if you are over 90 years old. Skin check. Lung cancer screening. You may have this screening every year starting at age 16 if you have a 30-pack-year history of smoking and currently smoke or have quit within the past 15 years. Fecal occult blood test (FOBT) of the stool. You may have this test every year starting at age 91. Flexible sigmoidoscopy or colonoscopy. You may have a sigmoidoscopy every 5 years or a colonoscopy every 10 years starting at age 60. Hepatitis C blood test. Hepatitis B blood test. Sexually transmitted disease (STD) testing. Diabetes screening. This is done by checking your blood sugar (glucose) after you have not eaten for a while (fasting). You may have this done every 1-3 years. Bone density scan. This is done to screen for osteoporosis. You may have this done starting at age 67. Mammogram. This may be done every 1-2 years. Talk to your health care provider about how often you should have regular mammograms. Talk with your health care provider about your test results, treatment options, and if necessary, the need for more tests. Vaccines  Your health care provider may recommend certain vaccines, such as: Influenza vaccine. This is recommended every year. Tetanus, diphtheria, and acellular pertussis (Tdap, Td) vaccine. You may need a Td booster every 10 years. Zoster vaccine. You may  need this after age 77. Pneumococcal 13-valent conjugate (PCV13) vaccine. One  dose is recommended after age 29. Pneumococcal polysaccharide (PPSV23) vaccine. One dose is recommended after age 81. Talk to your health care provider about which screenings and vaccines you need and how often you need them. This information is not intended to replace advice given to you by your health care provider. Make sure you discuss any questions you have with your health care provider. Document Released: 08/25/2015 Document Revised: 04/17/2016 Document Reviewed: 05/30/2015 Elsevier Interactive Patient Education  2017 [place] Prevention in the Home Falls can cause injuries. They can happen to people of all ages. There are many things you can do to make your home safe and to help prevent falls. What can I do on the outside of my home? Regularly fix the edges of walkways and driveways and fix any cracks. Remove anything that might make you trip as you walk through a door, such as a raised step or threshold. Trim any bushes or trees on the path to your home. Use bright outdoor lighting. Clear any walking paths of anything that might make someone trip, such as rocks or tools. Regularly check to see if handrails are loose or broken. Make sure that both sides of any steps have handrails. Any raised decks and porches should have guardrails on the edges. Have any leaves, snow, or ice cleared regularly. Use sand or salt on walking paths during winter. Clean up any spills in your garage right away. This includes oil or grease spills. What can I do in the bathroom? Use night lights. Install grab bars by the toilet and in the tub and shower. Do not use towel bars as grab bars. Use non-skid mats or decals in the tub or shower. If you need to sit down in the shower, use a plastic, non-slip stool. Keep the floor dry. Clean up any water that spills on the floor as soon as it happens. Remove soap buildup in the tub or shower regularly. Attach bath mats securely with double-sided  non-slip rug tape. Do not have throw rugs and other things on the floor that can make you trip. What can I do in the bedroom? Use night lights. Make sure that you have a light by your bed that is easy to reach. Do not use any sheets or blankets that are too big for your bed. They should not hang down onto the floor. Have a firm chair that has side arms. You can use this for support while you get dressed. Do not have throw rugs and other things on the floor that can make you trip. What can I do in the kitchen? Clean up any spills right away. Avoid walking on wet floors. Keep items that you use a lot in easy-to-reach places. If you need to reach something above you, use a strong step stool that has a grab bar. Keep electrical cords out of the way. Do not use floor polish or wax that makes floors slippery. If you must use wax, use non-skid floor wax. Do not have throw rugs and other things on the floor that can make you trip. What can I do with my stairs? Do not leave any items on the stairs. Make sure that there are handrails on both sides of the stairs and use them. Fix handrails that are broken or loose. Make sure that handrails are as long as the stairways. Check any carpeting to make sure that it is firmly attached  to the stairs. Fix any carpet that is loose or worn. Avoid having throw rugs at the top or bottom of the stairs. If you do have throw rugs, attach them to the floor with carpet tape. Make sure that you have a light switch at the top of the stairs and the bottom of the stairs. If you do not have them, ask someone to add them for you. What else can I do to help prevent falls? Wear shoes that: Do not have high heels. Have rubber bottoms. Are comfortable and fit you well. Are closed at the toe. Do not wear sandals. If you use a stepladder: Make sure that it is fully opened. Do not climb a closed stepladder. Make sure that both sides of the stepladder are locked into place. Ask  someone to hold it for you, if possible. Clearly mark and make sure that you can see: Any grab bars or handrails. First and last steps. Where the edge of each step is. Use tools that help you move around (mobility aids) if they are needed. These include: Canes. Walkers. Scooters. Crutches. Turn on the lights when you go into a dark area. Replace any light bulbs as soon as they burn out. Set up your furniture so you have a clear path. Avoid moving your furniture around. If any of your floors are uneven, fix them. If there are any pets around you, be aware of where they are. Review your medicines with your doctor. Some medicines can make you feel dizzy. This can increase your chance of falling. Ask your doctor what other things that you can do to help prevent falls. This information is not intended to replace advice given to you by your health care provider. Make sure you discuss any questions you have with your health care provider. Document Released: 05/25/2009 Document Revised: 01/04/2016 Document Reviewed: 09/02/2014 Elsevier Interactive Patient Education  2017 [employer].

## 2021-10-02 ENCOUNTER — Telehealth: Payer: Self-pay | Admitting: Medical

## 2021-10-02 ENCOUNTER — Ambulatory Visit (HOSPITAL_BASED_OUTPATIENT_CLINIC_OR_DEPARTMENT_OTHER)
Admission: RE | Admit: 2021-10-02 | Discharge: 2021-10-02 | Disposition: A | Discharge status: Home / Self Care | Payer: Medicare HMO | Source: Ambulatory Visit | Attending: Medical | Admitting: Medical

## 2021-10-02 ENCOUNTER — Other Ambulatory Visit: Payer: Self-pay

## 2021-10-02 ENCOUNTER — Encounter: Payer: Self-pay | Admitting: Medical

## 2021-10-02 ENCOUNTER — Ambulatory Visit: Payer: Medicare HMO | Admitting: Medical

## 2021-10-02 DIAGNOSIS — R519 Headache, unspecified: Secondary | ICD-10-CM | POA: Insufficient documentation

## 2021-10-02 DIAGNOSIS — H539 Unspecified visual disturbance: Secondary | ICD-10-CM | POA: Diagnosis not present

## 2021-10-02 DIAGNOSIS — I6381 Other cerebral infarction due to occlusion or stenosis of small artery: Secondary | ICD-10-CM | POA: Diagnosis not present

## 2021-10-02 IMAGING — CT CT HEAD W/O CM
3 series · 14 of 47 positions shown, 16 images · non-contrast
Comparison: CT head [DATE]

CLINICAL DATA: Headache, increasing with pressure sensation



[Series 2: head wo · axial · 0.45mm/px · z∈[+1308,+1432]mm · 8 of 30 slices shown, 10 images]
[im 3/30  brain]
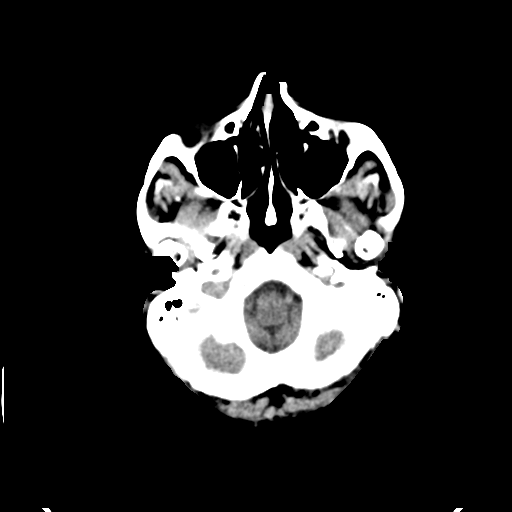
[im 3/30  bone]
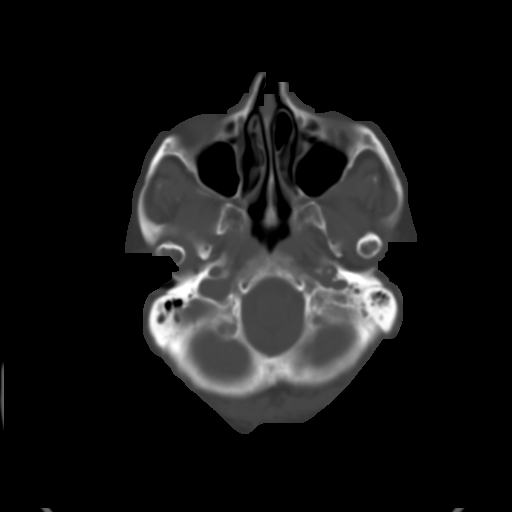
[im 7/30  brain]
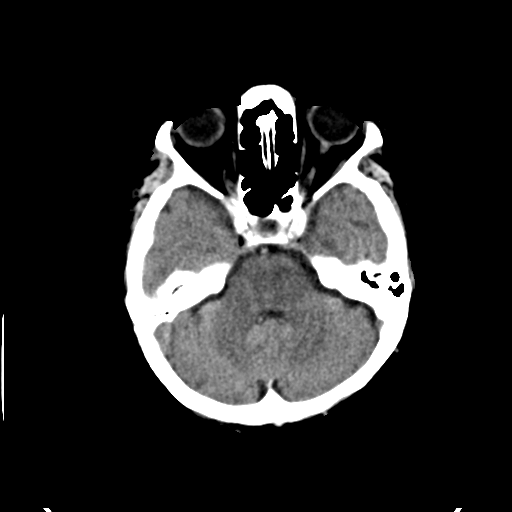
[im 10/30  brain]
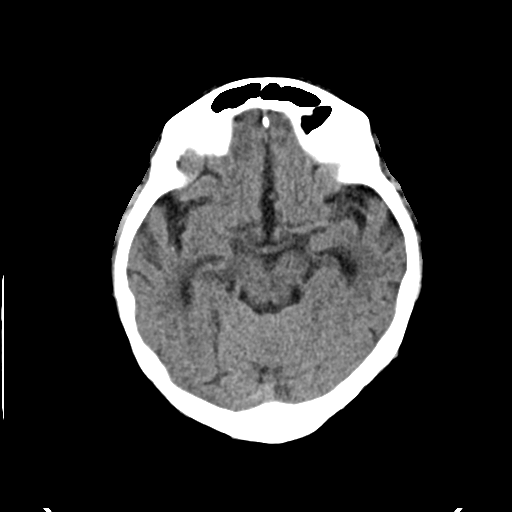
[im 14/30  brain]
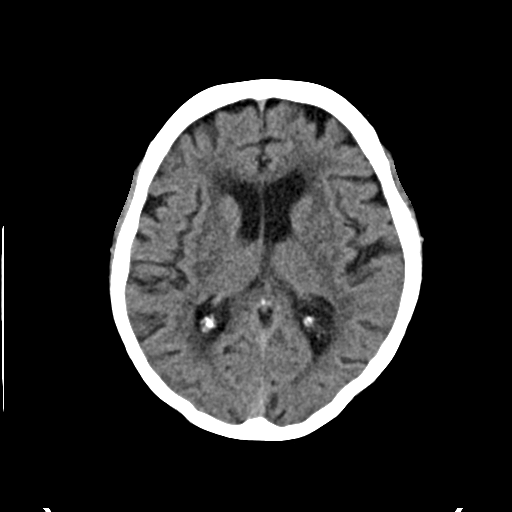
[im 17/30  brain]
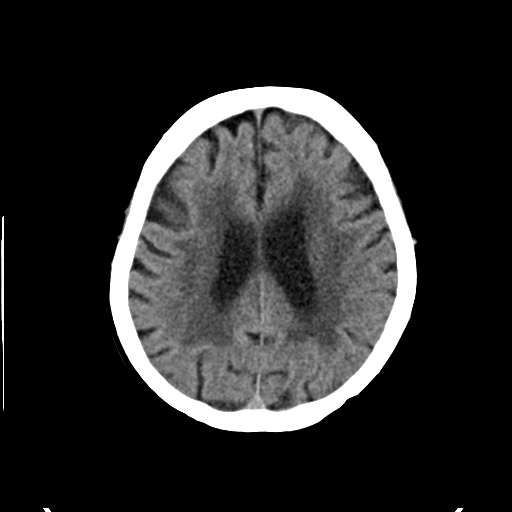
[im 17/30  bone]
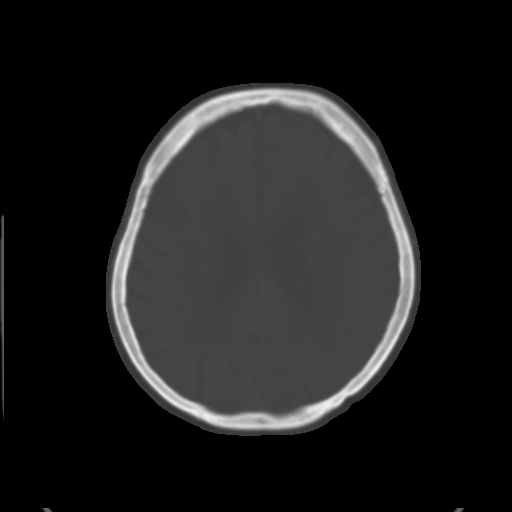
[im 21/30  brain]
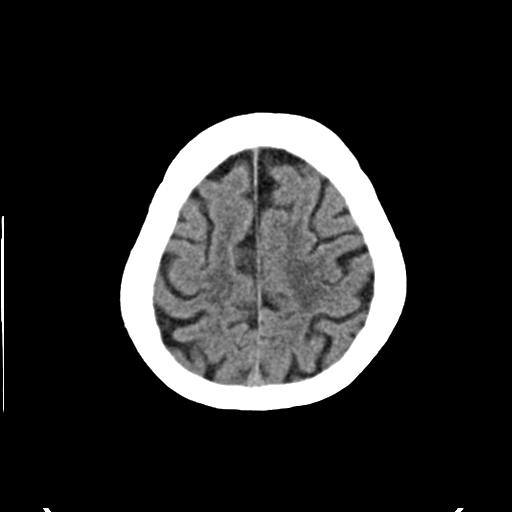
[im 24/30  brain]
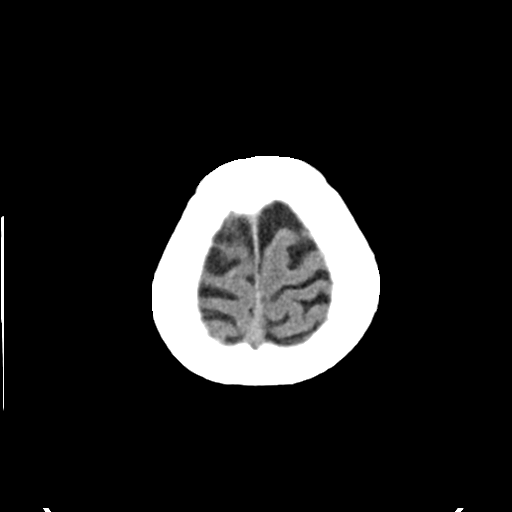
[im 28/30  brain]
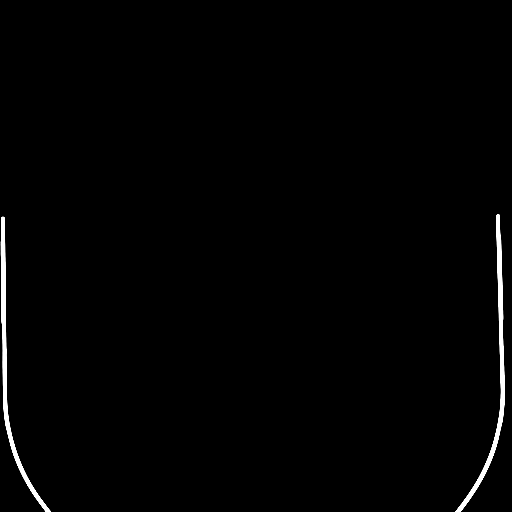

[Series 4: coronal soft · coronal · 0.28mm/px · 3 of 67 slices shown]
[im 23/67  brain]
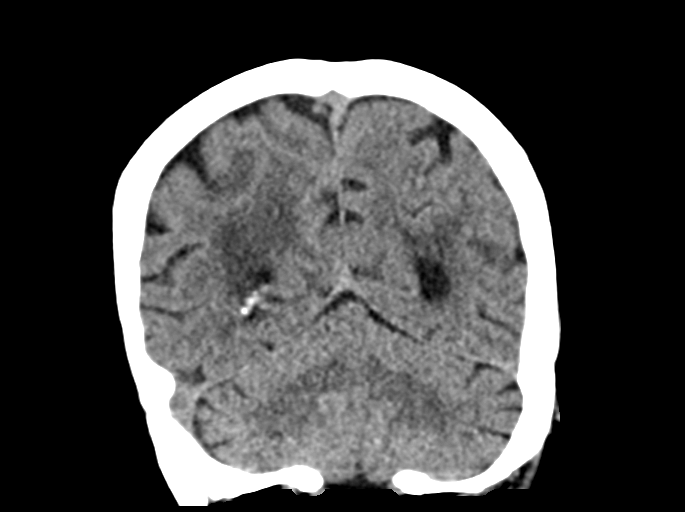
[im 30/67  brain]
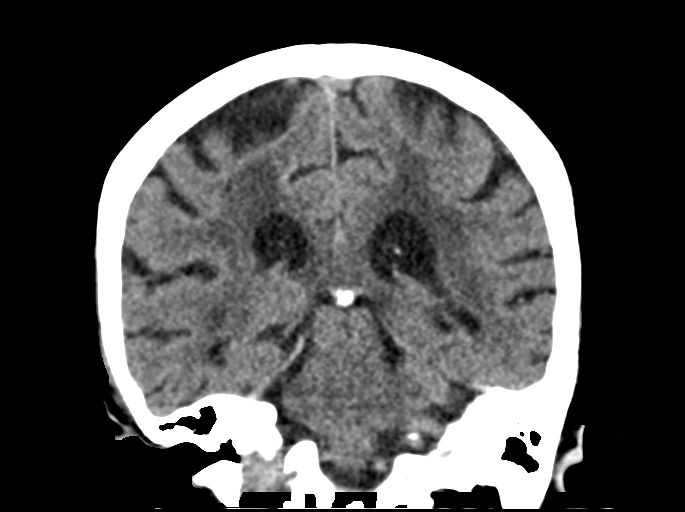
[im 37/67  brain]
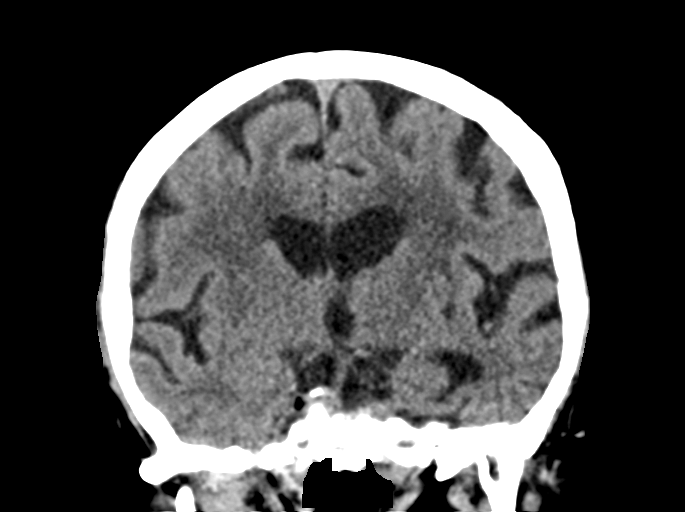

[Series 5: sag soft · sagittal · 0.28mm/px · 3 of 67 slices shown]
[im 23/67  brain]
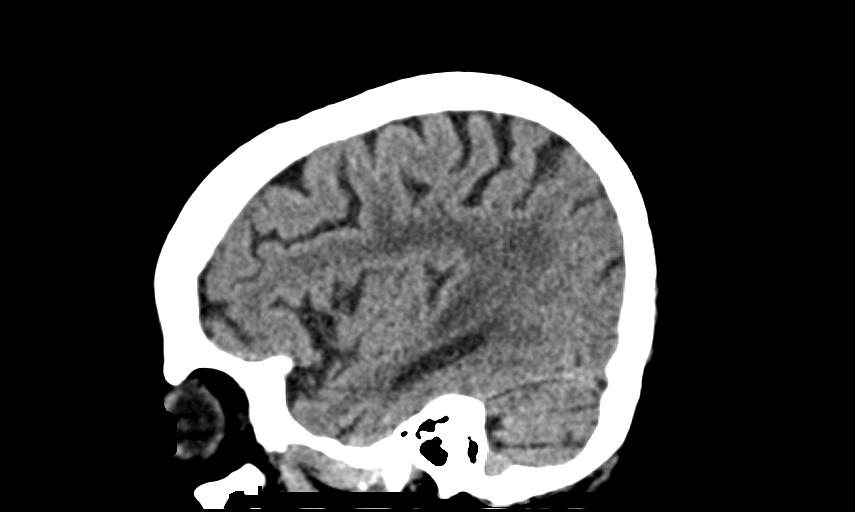
[im 34/67  brain]
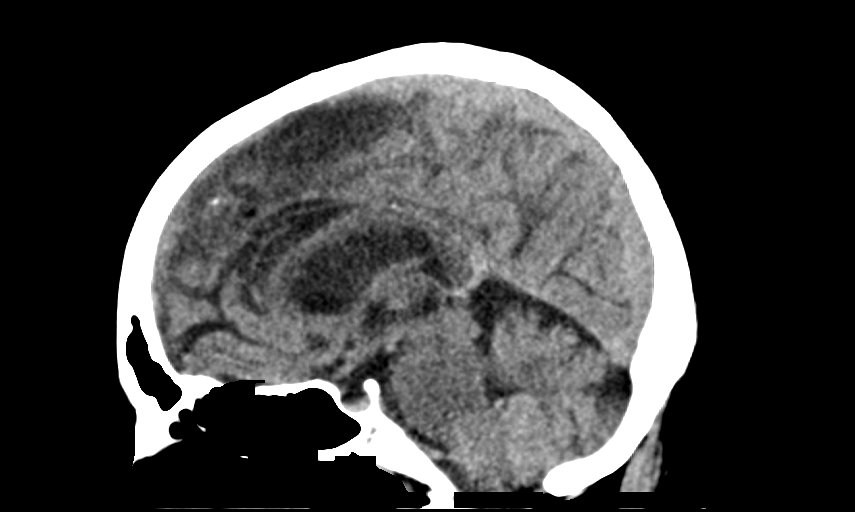
[im 45/67  brain]
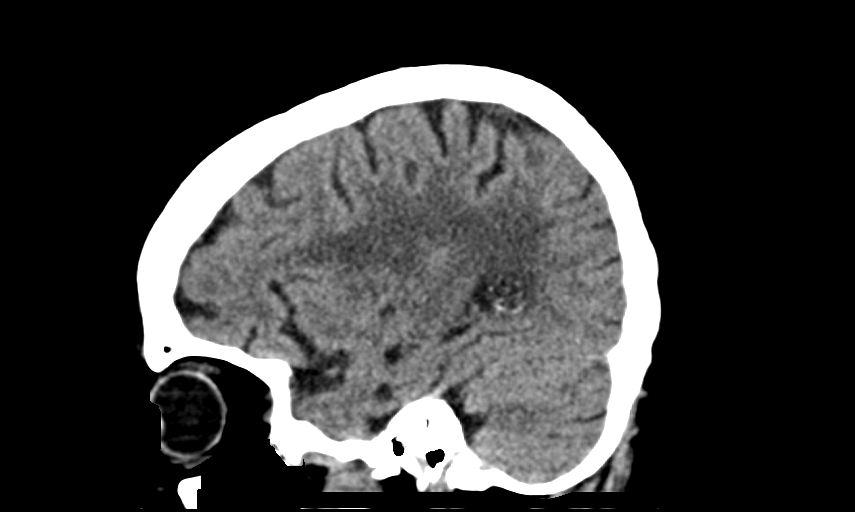

[14 of 47 positions shown; findings below may reference images not displayed]

FINDINGS: Brain: There is no evidence of acute intracranial hemorrhage,
extra-axial fluid collection, or acute infarct.

Background parenchymal volume is within normal limits for age. The
ventricles are stable in size. There is confluent hypodensity
throughout the subcortical and periventricular white matter likely
reflecting sequela of advanced chronic white matter microangiopathy.
There are remote lacunar infarcts in the bilateral basal ganglia and
thalami, unchanged.

There is no mass lesion.  There is no midline shift.

Vascular: There is calcification of the bilateral cavernous ICAs.

Skull: Normal. Negative for fracture or focal lesion.

Sinuses/Orbits: There is mucosal thickening in the left sphenoid
sinus. Bilateral lens implants are in place. The globes and orbits
are otherwise unremarkable.

Other: None.
IMPRESSION: Stable appearance of the brain since [DATE] with no evidence of
acute intracranial pathology.

## 2021-10-02 NOTE — Telephone Encounter (Signed)
Called jose/son. Gave him radiology number to set up ct head for his mom. Also send message to receptionist letting her know he will be calling.  Mackie Pai, PA-C

## 2021-10-03 ENCOUNTER — Encounter: Payer: Self-pay | Admitting: Medical

## 2021-10-03 ENCOUNTER — Telehealth: Payer: Self-pay | Admitting: Medical
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
## 2021-10-03 NOTE — Addendum Note (Signed)
Addended by: Anabel Halon on: 10/03/2021 07:09 AM   Modules accepted: Orders

## 2021-10-03 NOTE — Telephone Encounter (Signed)
Drs, Leslie Ward and Leslie Ward,  I just placed referral for this pt this morning. Hoping to get quick referral. She had seen Dr. Terrace Ward in past for involuntary head movements and twitching. Recent bppv symptoms resolving(did see ent). Now having  2 weeks of more head pressure, ha when turing head to rt. Some vision changes.(Pt has glaucoma and has eye MD) Frequency and duration of events worsening. Below in " referral request.   I had advised if symptoms worsen or change go to ED as more extensive work may be needed immediately.(Mri)  If pt symptoms stable considering mri without contrast pending appontment. Would you recommend I  include contrast? Prior authorization for study may be barrier.   Thanks for your help with our patients.  Leslie Ward   "Hx of cervical dystonia, involuntary head movements ,bppv and most recent episodes of transient head pressure, ha when turning neck to rt. Recent ct head  10-02-2021 showed no acute finding but stable chronic changes but old lacunar infarcts present. Pt has seen Dr. Terrace Ward in past. Can pt be seen quickly within a week if possible? Thought since established maybe be seen quickly. Also considered Dr. Lucia Ward as she is ha specialist."

## 2021-10-04 NOTE — Telephone Encounter (Signed)
Work in [employer] on November 01 2021

## 2021-10-04 NOTE — Telephone Encounter (Signed)
Patient is scheduled for 3/23 at 8:45 with Dr. Krista Blue. I have patient as high priority on cancellation list for any sooner appts. I have her on my personal list as well and will keep an eye out. FYI

## 2021-10-04 NOTE — Telephone Encounter (Signed)
Pt's son, Leslie Ward requesting a earlier than April 25. She's having dizziness, head pressure pain. Would like a call from the nurse discuss a work in appt.

## 2021-10-05 ENCOUNTER — Encounter: Payer: Self-pay | Admitting: Internal Medicine

## 2021-10-05 DIAGNOSIS — H35363 Drusen (degenerative) of macula, bilateral: Secondary | ICD-10-CM | POA: Diagnosis not present

## 2021-10-05 DIAGNOSIS — R42 Dizziness and giddiness: Secondary | ICD-10-CM | POA: Diagnosis not present

## 2021-10-05 DIAGNOSIS — R519 Headache, unspecified: Secondary | ICD-10-CM | POA: Diagnosis not present

## 2021-10-05 DIAGNOSIS — H401132 Primary open-angle glaucoma, bilateral, moderate stage: Secondary | ICD-10-CM | POA: Diagnosis not present

## 2021-10-05 DIAGNOSIS — H353131 Nonexudative age-related macular degeneration, bilateral, early dry stage: Secondary | ICD-10-CM | POA: Diagnosis not present

## 2021-10-09 ENCOUNTER — Observation Stay (HOSPITAL_BASED_OUTPATIENT_CLINIC_OR_DEPARTMENT_OTHER)
Admission: EM | Admit: 2021-10-09 | Discharge: 2021-10-12 | Disposition: A | Discharge status: Home Health | Payer: Medicare HMO | Attending: Family Medicine | Admitting: Family Medicine

## 2021-10-09 ENCOUNTER — Encounter (HOSPITAL_BASED_OUTPATIENT_CLINIC_OR_DEPARTMENT_OTHER): Payer: Self-pay

## 2021-10-09 ENCOUNTER — Emergency Department (HOSPITAL_BASED_OUTPATIENT_CLINIC_OR_DEPARTMENT_OTHER): Payer: Medicare HMO

## 2021-10-09 ENCOUNTER — Other Ambulatory Visit: Payer: Self-pay

## 2021-10-09 DIAGNOSIS — R42 Dizziness and giddiness: Secondary | ICD-10-CM | POA: Diagnosis not present

## 2021-10-09 DIAGNOSIS — E039 Hypothyroidism, unspecified: Secondary | ICD-10-CM | POA: Diagnosis present

## 2021-10-09 DIAGNOSIS — Q278 Other specified congenital malformations of peripheral vascular system: Secondary | ICD-10-CM | POA: Diagnosis not present

## 2021-10-09 DIAGNOSIS — R1310 Dysphagia, unspecified: Secondary | ICD-10-CM | POA: Insufficient documentation

## 2021-10-09 DIAGNOSIS — W19XXXA Unspecified fall, initial encounter: Secondary | ICD-10-CM | POA: Insufficient documentation

## 2021-10-09 DIAGNOSIS — R55 Syncope and collapse: Secondary | ICD-10-CM

## 2021-10-09 DIAGNOSIS — I10 Essential (primary) hypertension: Secondary | ICD-10-CM | POA: Diagnosis not present

## 2021-10-09 DIAGNOSIS — Z20822 Contact with and (suspected) exposure to covid-19: Secondary | ICD-10-CM | POA: Diagnosis not present

## 2021-10-09 DIAGNOSIS — S066X0A Traumatic subarachnoid hemorrhage without loss of consciousness, initial encounter: Secondary | ICD-10-CM | POA: Diagnosis not present

## 2021-10-09 DIAGNOSIS — I6503 Occlusion and stenosis of bilateral vertebral arteries: Secondary | ICD-10-CM | POA: Diagnosis not present

## 2021-10-09 DIAGNOSIS — Z8673 Personal history of transient ischemic attack (TIA), and cerebral infarction without residual deficits: Secondary | ICD-10-CM | POA: Insufficient documentation

## 2021-10-09 DIAGNOSIS — E876 Hypokalemia: Secondary | ICD-10-CM | POA: Diagnosis not present

## 2021-10-09 DIAGNOSIS — R519 Headache, unspecified: Secondary | ICD-10-CM | POA: Diagnosis not present

## 2021-10-09 DIAGNOSIS — Z79899 Other long term (current) drug therapy: Secondary | ICD-10-CM | POA: Insufficient documentation

## 2021-10-09 DIAGNOSIS — Y92000 Kitchen of unspecified non-institutional (private) residence as  the place of occurrence of the external cause: Secondary | ICD-10-CM | POA: Diagnosis not present

## 2021-10-09 DIAGNOSIS — D72829 Elevated white blood cell count, unspecified: Secondary | ICD-10-CM | POA: Insufficient documentation

## 2021-10-09 DIAGNOSIS — I672 Cerebral atherosclerosis: Secondary | ICD-10-CM | POA: Diagnosis not present

## 2021-10-09 DIAGNOSIS — E119 Type 2 diabetes mellitus without complications: Secondary | ICD-10-CM

## 2021-10-09 DIAGNOSIS — E1140 Type 2 diabetes mellitus with diabetic neuropathy, unspecified: Secondary | ICD-10-CM | POA: Diagnosis not present

## 2021-10-09 DIAGNOSIS — S199XXA Unspecified injury of neck, initial encounter: Secondary | ICD-10-CM | POA: Diagnosis not present

## 2021-10-09 HISTORY — DX: Syncope and collapse: R55

## 2021-10-09 LAB — DIFFERENTIAL
Abs Immature Granulocytes: 0.1 10*3/uL — ABNORMAL HIGH (ref 0.00–0.07)
Basophils Absolute: 0 10*3/uL (ref 0.0–0.1)
Basophils Relative: 0 %
Eosinophils Absolute: 0 10*3/uL (ref 0.0–0.5)
Eosinophils Relative: 0 %
Immature Granulocytes: 1 %
Lymphocytes Relative: 32 %
Lymphs Abs: 3 10*3/uL (ref 0.7–4.0)
Monocytes Absolute: 0.4 10*3/uL (ref 0.1–1.0)
Monocytes Relative: 4 %
Neutro Abs: 5.6 10*3/uL (ref 1.7–7.7)
Neutrophils Relative %: 63 %

## 2021-10-09 LAB — URINALYSIS, ROUTINE W REFLEX MICROSCOPIC
Bilirubin Urine: NEGATIVE
Glucose, UA: NEGATIVE mg/dL
Hgb urine dipstick: NEGATIVE
Ketones, ur: NEGATIVE mg/dL
Leukocytes,Ua: NEGATIVE
Nitrite: NEGATIVE
Protein, ur: NEGATIVE mg/dL
Specific Gravity, Urine: 1.015 (ref 1.005–1.030)
pH: 7 (ref 5.0–8.0)

## 2021-10-09 LAB — PROTIME-INR
INR: 1 (ref 0.8–1.2)
Prothrombin Time: 12.7 seconds (ref 11.4–15.2)

## 2021-10-09 LAB — RESP PANEL BY RT-PCR (FLU A&B, COVID) ARPGX2
Influenza A by PCR: NEGATIVE
Influenza B by PCR: NEGATIVE
SARS Coronavirus 2 by RT PCR: NEGATIVE

## 2021-10-09 LAB — HEPATIC FUNCTION PANEL
ALT: 11 U/L (ref 0–44)
AST: 16 U/L (ref 15–41)
Albumin: 3.7 g/dL (ref 3.5–5.0)
Alkaline Phosphatase: 55 U/L (ref 38–126)
Bilirubin, Direct: 0.2 mg/dL (ref 0.0–0.2)
Indirect Bilirubin: 0.8 mg/dL (ref 0.3–0.9)
Total Bilirubin: 1 mg/dL (ref 0.3–1.2)
Total Protein: 7.2 g/dL (ref 6.5–8.1)

## 2021-10-09 LAB — BASIC METABOLIC PANEL
Anion gap: 7 (ref 5–15)
BUN: 22 mg/dL (ref 8–23)
CO2: 26 mmol/L (ref 22–32)
Calcium: 9.3 mg/dL (ref 8.9–10.3)
Chloride: 102 mmol/L (ref 98–111)
Creatinine, Ser: 0.89 mg/dL (ref 0.44–1.00)
GFR, Estimated: >60 mL/min (ref >60)
Glucose, Bld: 113 mg/dL — ABNORMAL HIGH (ref 70–99)
Potassium: 4.1 mmol/L (ref 3.5–5.1)
Sodium: 135 mmol/L (ref 135–145)

## 2021-10-09 LAB — CBC
HCT: 42.5 % (ref 36.0–46.0)
HCT: 43.5 % (ref 36.0–46.0)
Hemoglobin: 13.9 g/dL (ref 12.0–15.0)
Hemoglobin: 14.2 g/dL (ref 12.0–15.0)
MCH: 28.8 pg (ref 26.0–34.0)
MCH: 29 pg (ref 26.0–34.0)
MCHC: 32.7 g/dL (ref 30.0–36.0)
MCHC: 32.6 g/dL (ref 30.0–36.0)
MCV: 88 fL (ref 80.0–100.0)
MCV: 88.8 fL (ref 80.0–100.0)
Platelets: 293 10*3/uL (ref 150–400)
Platelets: 247 10*3/uL (ref 150–400)
RBC: 4.83 MIL/uL (ref 3.87–5.11)
RBC: 4.9 MIL/uL (ref 3.87–5.11)
RDW: 16.8 % — ABNORMAL HIGH (ref 11.5–15.5)
RDW: 17.1 % — ABNORMAL HIGH (ref 11.5–15.5)
WBC: 8.9 10*3/uL (ref 4.0–10.5)
WBC: 9.1 10*3/uL (ref 4.0–10.5)
nRBC: 0 % (ref 0.0–0.2)
nRBC: 0 % (ref 0.0–0.2)

## 2021-10-09 LAB — RAPID URINE DRUG SCREEN, HOSP PERFORMED
Amphetamines: NOT DETECTED
Barbiturates: NOT DETECTED
Benzodiazepines: NOT DETECTED
Cocaine: NOT DETECTED
Opiates: NOT DETECTED
Tetrahydrocannabinol: NOT DETECTED

## 2021-10-09 LAB — CBG MONITORING, ED: Glucose-Capillary: 105 mg/dL — ABNORMAL HIGH (ref 70–99)

## 2021-10-09 LAB — ETHANOL: Alcohol, Ethyl (B): <10 mg/dL (ref <10)

## 2021-10-09 LAB — APTT: aPTT: 25 seconds (ref 24–36)

## 2021-10-09 IMAGING — CT CT ANGIO HEAD-NECK (W OR W/O PERF)
1 of 11 series · 6 of 33 positions shown · IV contrast (Omnipaque)
Comparison: CT head [DATE]

CLINICAL DATA: Subarachnoid hemorrhage. Dizziness and headache.
Fall.

EXAM:
CT ANGIOGRAPHY HEAD AND NECK
TECHNIQUE: Multidetector CT imaging of the head and neck was performed using
the standard protocol during bolus administration of intravenous
contrast. Multiplanar CT image reconstructions and MIPs were
obtained to evaluate the vascular anatomy. Carotid stenosis
measurements (when applicable) are obtained utilizing NASCET
criteria, using the distal internal carotid diameter as the
denominator.

[Series 12: axial thin · axial · 0.39mm/px · z∈[+1199,+1413]mm · 6 of 300 slices shown]
[im 43/300  soft-tissue]
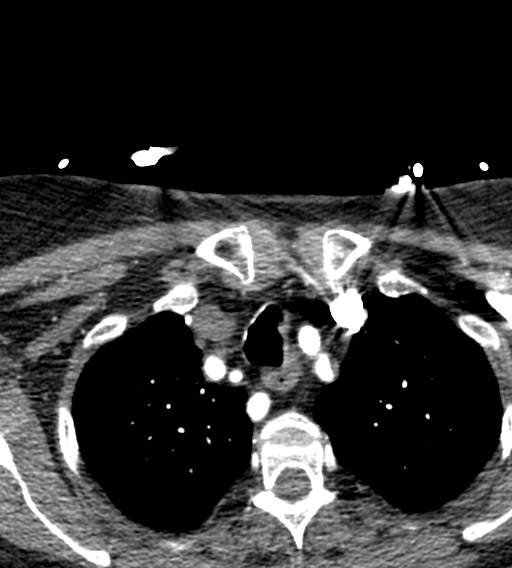
[im 86/300  bone]
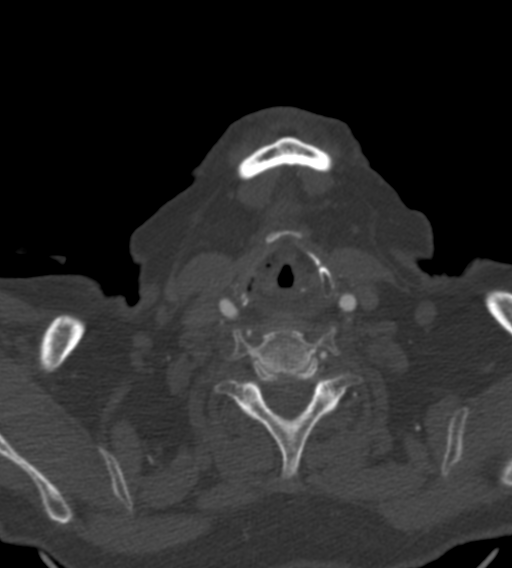
[im 129/300  soft-tissue]
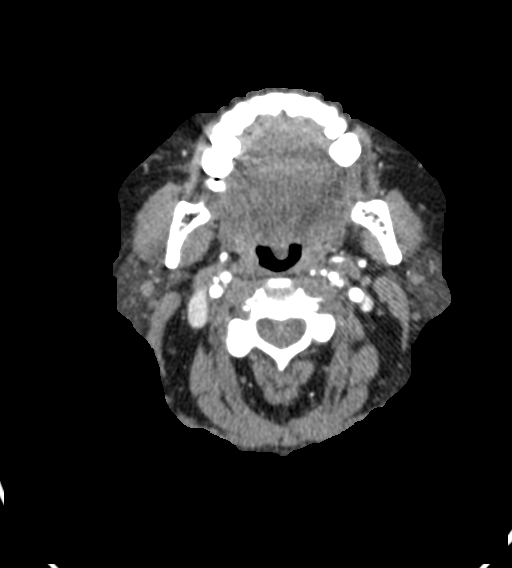
[im 171/300  bone]
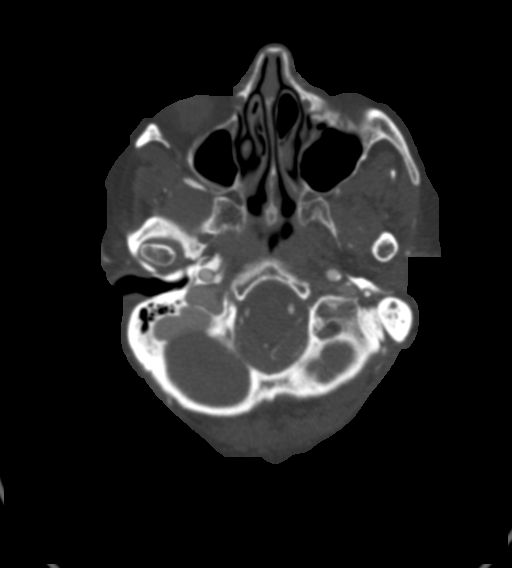
[im 214/300  soft-tissue]
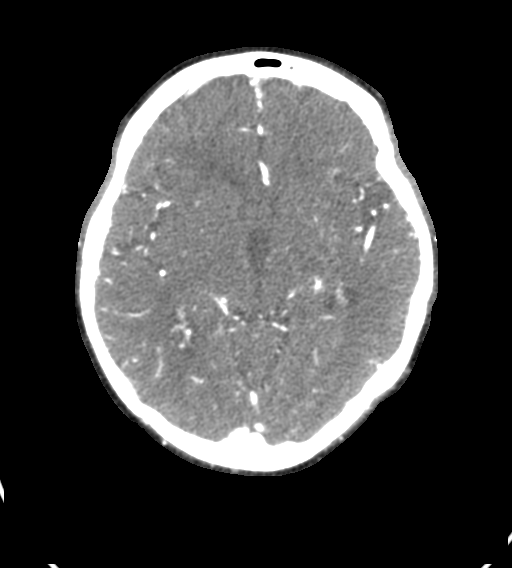
[im 257/300  bone]
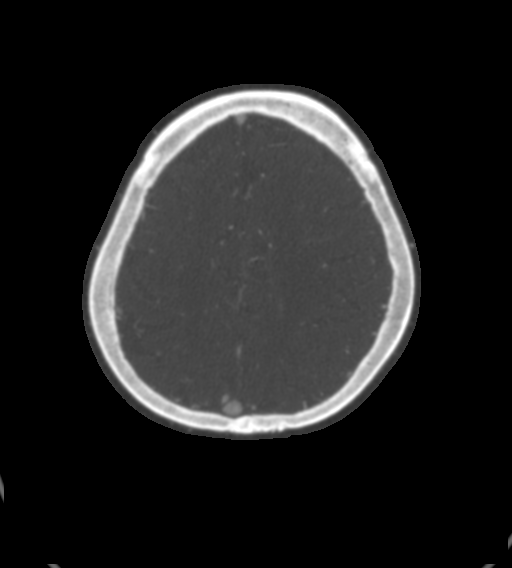

[6 of 33 positions shown; findings below may reference images not displayed]

RADIATION DOSE REDUCTION: This exam was performed according to the
departmental dose-optimization program which includes automated
exposure control, adjustment of the mA and/or kV according to
patient size and/or use of iterative reconstruction technique.

CONTRAST:  80mL OMNIPAQUE IOHEXOL 350 MG/ML SOLN
FINDINGS: CT HEAD FINDINGS

Brain: Moderate atrophy. Moderate chronic microvascular ischemic
change in the white matter.

Negative for acute infarct, acute hemorrhage, or mass lesion.

Vascular: Negative for hyperdense vessel

Skull: Negative

Sinuses: Mild mucosal edema paranasal sinuses.

Orbits: No orbital lesion.  Bilateral cataract extraction

Review of the MIP images confirms the above findings

CTA NECK FINDINGS

Aortic arch: Mild atherosclerotic disease aortic arch. Aberrant
right subclavian artery. Proximal great vessels patent without
stenosis.

Right carotid system: Right carotid widely patent. Negative for
atherosclerotic disease or dissection.

Left carotid system: Left carotid bifurcation widely patent.
Eccentric calcific plaque in the internal carotid artery below the
skull base without significant stenosis. Negative for dissection.

Vertebral arteries: Proximal right vertebral artery patent.
Scattered atherosclerotic disease throughout the right cervical
carotid with diffuse narrowing. Multiple areas of moderate stenosis
in the right vertebral artery.

Proximal left vertebral artery is patent. Moderate to severe
stenosis left vertebral artery at the C6 level. Mild stenosis distal
left vertebral artery at the C1 level.

Skeleton: Multilevel disc and facet degeneration throughout the
cervical spine. No acute skeletal abnormality.

Other neck: Negative for mass or edema in the neck.

Upper chest: Lung apices clear bilaterally.

Review of the MIP images confirms the above findings

CTA HEAD FINDINGS

Anterior circulation: Internal carotid artery widely patent through
the skull base and cavernous segment. Mild atherosclerotic disease
in the cavernous carotid bilaterally. Anterior and middle cerebral
arteries patent bilaterally without stenosis or large vessel
occlusion. Negative for aneurysm.

Posterior circulation: Both vertebral arteries patent to the
basilar. PICA patent bilaterally. Basilar patent. Superior
cerebellar and posterior cerebral arteries patent bilaterally
without stenosis or large vessel occlusion.

Venous sinuses: Normal venous enhancement.

Anatomic variants: None

Review of the MIP images confirms the above findings
IMPRESSION: 1. Negative for intracranial large vessel occlusion or significant
stenosis
2. No significant carotid stenosis in the neck
3. Both vertebral arteries are diffusely diseased with moderate
stenosis bilaterally.
4. CT head demonstrates no acute abnormality. Atrophy and moderate
chronic microvascular ischemic change in the white matter.

## 2021-10-09 IMAGING — DX DG CHEST 1V PORT
1 series · 1 of 1 positions shown · non-contrast
Comparison: Chest radiograph dated [DATE].

CLINICAL DATA: Syncope.

EXAM:
PORTABLE CHEST 1 VIEW

[chest ap]
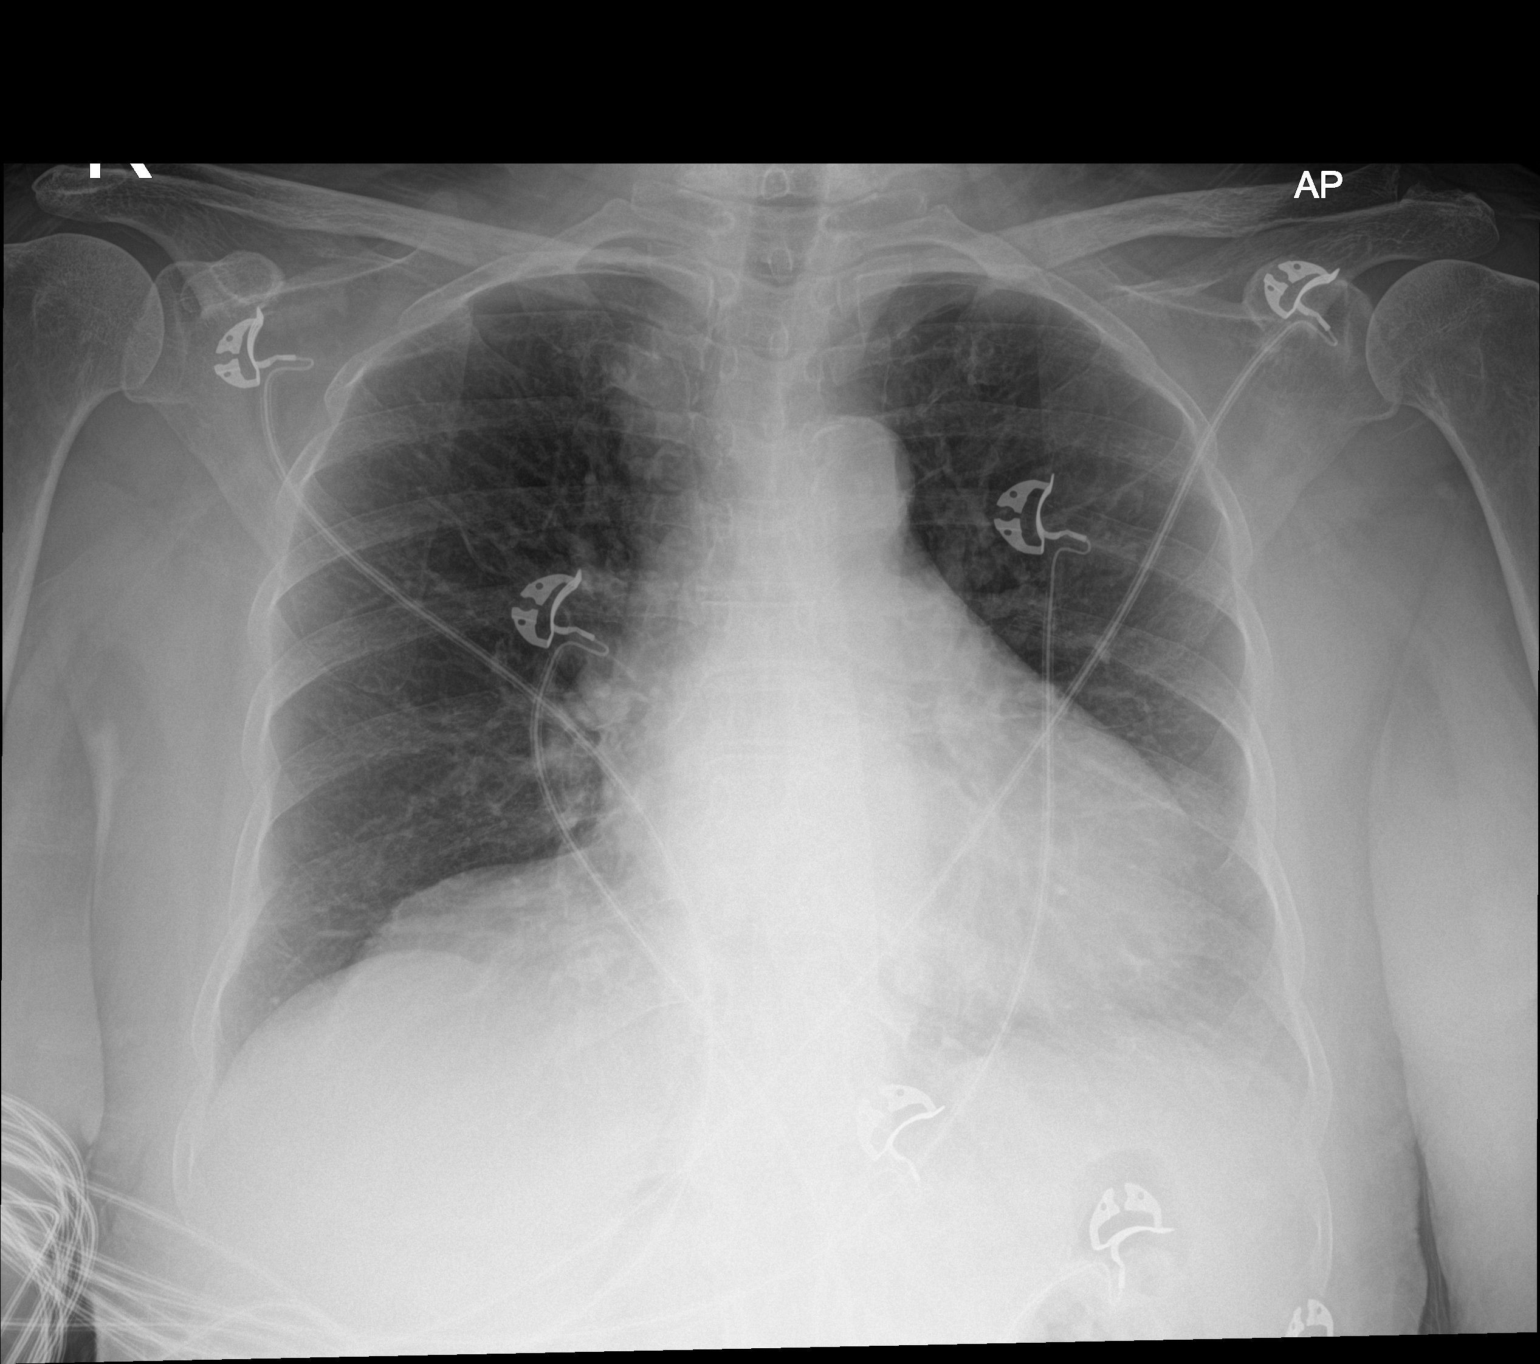

[1 of 1 positions shown; findings below may reference images not displayed]

FINDINGS: No focal consolidation, pleural effusion, pneumothorax. Stable
cardiac silhouette. Atherosclerotic calcification of the aorta. No
acute osseous pathology.
IMPRESSION: No active cardiopulmonary disease.

## 2021-10-09 MED ORDER — IOHEXOL 350 MG/ML SOLN
80.0000 mL | Freq: Once | INTRAVENOUS | Status: AC | PRN
  Administered 2021-10-09: 80 mL via INTRAVENOUS

## 2021-10-09 NOTE — ED Notes (Signed)
Pt was able to bear weight to transfer to commode, and then got back into bed on her own.

## 2021-10-09 NOTE — ED Triage Notes (Addendum)
Pt brought in by her daughter in law, states the pt called her this morning telling her that she fell down in the kitchen. DIL arrived and found her laying down on the floor unable to get up. The pt is A&OX3 but did not know the year, but daughter reports this is her baseline. The patient states she felt dizzy before she fell down. Unsure of LOC or head injury, denies taking blood thinners. Reports headache.

## 2021-10-09 NOTE — ED Provider Notes (Signed)
[facility] Provider Note   CSN: [id number] Arrival date & time: 10/09/21  1743     History  Chief Complaint  Patient presents with   Lytle Michaels    Cassandra Harbold is a 88 y.o. female.  HPI     88 year old female with a history of hypertension, glaucoma, thyroid disease who presents with concern for syncope and episodes of vertigo.  Daughter-in-law is interpreting per patient request/speaks spanish  She reports that she has been having episodes of headache and dizziness described as up-and-down visual changes occurring approximately 3 times a day for the past week.  Family reports they feel like its been going on for longer, approximately 3 weeks.  She reports that today while while she was making her bed she had an episode of dizziness that then improved, however she was sitting in the kitchen, suddenly developed a headache, approximately 7-8 out of 10 in severity and dizziness as an things moving and feeling off balance.  Reports she is unable to walk when she has episodes of dizziness.  Denies focal numbness, weakness, visual changes with the exception of visual movement, nausea, vomiting, difficulty talking, facial droop, abdominal pain, chest pain, shortness of breath or palpitations.  Today she had this episode of dizziness, and then she woke up on the floor.  When asked if she passed out she reports she is not sure what happened so fast, however family suspects it was a syncopal episode given she does not remember what happened or how she ended up on the floor.  Another family member to come and say that she had been evaluated for dizziness for longer than this, and had seen ENT, ophthalmology, had CT scan head completed and that they still were unable to find out the etiology.  Reviewed notes from January with ENT which reported concern for BPPV.  Had seen ophthalmology a few days ago with concern for headache and dizziness as well as primary open  glaucoma of both eyes.  At that appointment she described bilateral temporal pain that radiates to the forehead and back along the top of her head and associate with some blurred vision, no jaw pain, no systemic symptoms and reported episodes.  Ophthalmology had low suspicion for GCA.  They had planned on obtaining MRIs as an outpatient.   Past Medical History:  Diagnosis Date   Dystonia    Glaucoma    Hypertension    Thyroid disease      Home Medications Prior to Admission medications   Medication Sig Start Date End Date Taking? Authorizing Provider  amLODipine (NORVASC) 5 MG tablet TAKE 1 TABLET(5 MG) BY MOUTH TWICE DAILY 01/02/21   Richardo Priest, MD  blood glucose meter kit and supplies Dispense based on patient and insurance preference. Use up to four times daily as directed. (FOR ICD-10 E10.9, E11.9). Patient not taking: Reported on 08/24/2021 08/17/21   Saguier, Percell Miller, PA-C  Calcium Carbonate (CALCIUM 600 PO) Take 2 tablets by mouth daily.    [provider]  Calcium Polycarbophil (FIBER-CAPS PO) Take 1 capsule by mouth 2 (two) times a day.  Patient not taking: Reported on 08/24/2021    [provider]  Cinnamon 500 MG capsule Take 1,000 mg by mouth daily.    [provider]  dorzolamide-timolol (COSOPT) 22.3-6.8 MG/ML ophthalmic solution Place 1 drop into both eyes 2 times daily. 07/09/21   [provider]  fluticasone (FLONASE) 50 MCG/ACT nasal spray Place 2 sprays into both nostrils daily.  04/09/21   Saguier, Percell Miller, PA-C  gabapentin (NEURONTIN) 100 MG capsule Take 1 capsule (100 mg total) by mouth at bedtime. Patient not taking: Reported on 08/24/2021 11/20/20   Saguier, Percell Miller, PA-C  incobotulinumtoxinA [facility]) 100 units SOLR injection Inject 100 Units into the muscle every 3 (three) months. Patient not taking: Reported on 08/24/2021    [provider]  Lancets (ONETOUCH DELICA PLUS [id number]) [place] UP TO FOUR TIMES  DAILY AS DIRECTED 09/20/21   Saguier, Percell Miller, PA-C  levothyroxine (SYNTHROID) 50 MCG tablet TAKE ONE TABLET BY MOUTH DAILY BEFORE BREAKFAST 07/31/21   Saguier, Percell Miller, PA-C  meclizine (ANTIVERT) 12.5 MG tablet Take 1 tablet (12.5 mg total) by mouth 3 (three) times daily as needed for dizziness. Patient not taking: Reported on 08/24/2021 08/16/21   Saguier, Percell Miller, PA-C  meclizine (ANTIVERT) 12.5 MG tablet Take 1 tablet (12.5 mg total) by mouth 3 (three) times daily as needed for dizziness. 10/01/21   Saguier, Percell Miller, PA-C  MELATONIN PO Take 2-3 tablets by mouth at bedtime as needed (sleep).     [provider]  Menaquinone-7 (VITAMIN K2 PO) Take 1 tablet by mouth at bedtime.    [provider]  metoprolol tartrate (LOPRESSOR) 50 MG tablet TAKE ONE TABLET BY MOUTH TWICE A DAY 06/01/21   Saguier, Percell Miller, PA-C  Multiple Vitamin (MULTIVITAMIN) tablet Take 1 tablet by mouth daily.    [provider]  Omega-3 Fatty Acids (OMEGA 3 PO) Take 2 tablets by mouth every evening.     [provider]  [facility] ULTRA test strip  08/21/21   [provider]  Phytosterol Esters (CHOLEST CARE PO) Take 1,600 mg by mouth daily.    [provider]  predniSONE (DELTASONE) 5 MG tablet Take 1 tablet (5 mg total) by mouth daily with breakfast. 09/26/21   Collier Salina, MD  Probiotic Product (PROBIOTIC DAILY PO) Take 1 tablet by mouth daily.    [provider]  telmisartan (MICARDIS) 20 MG tablet Take 1 tablet (20 mg total) by mouth daily. 11/09/20   Richardo Priest, MD  timolol (TIMOPTIC) 0.5 % ophthalmic solution SMARTSIG:In Eye(s) 06/20/21   [provider]  vitamin C (ASCORBIC ACID) 500 MG tablet Take 500 mg by mouth every evening.     [provider]      Allergies    Crestor [rosuvastatin calcium]    Review of Systems   Review of Systems See above  Physical Exam Updated Vital Signs BP (!) 132/54    Pulse 66    Temp 97.9 F (36.6 C)  (Oral)    Resp (!) 23    Ht 4' 9"  (1.448 m)    Wt 53.1 kg    SpO2 93%    BMI 25.32 kg/m  Physical Exam Vitals and nursing note reviewed.  Constitutional:      General: She is not in acute distress.    Appearance: She is well-developed. She is not diaphoretic.  HENT:     Head: Normocephalic and atraumatic.  Eyes:     General: No visual field deficit.    Conjunctiva/sclera: Conjunctivae normal.  Cardiovascular:     Rate and Rhythm: Normal rate and regular rhythm.     Heart sounds: Normal heart sounds. No murmur heard.   No friction rub. No gallop.  Pulmonary:     Effort: Pulmonary effort is normal. No respiratory distress.     Breath sounds: Normal breath sounds. No wheezing or rales.  Abdominal:     General: There is no distension.     Palpations: Abdomen is soft.     Tenderness: There is no abdominal tenderness. There is no guarding.  Musculoskeletal:        General: No tenderness.     Cervical back: Normal range of motion.  Skin:    General: Skin is warm and dry.     Findings: No erythema or rash.  Neurological:     Mental Status: She is alert and oriented to person, place, and time.     GCS: GCS eye subscore is 4. GCS verbal subscore is 5. GCS motor subscore is 6.     Cranial Nerves: No cranial nerve deficit, dysarthria or facial asymmetry.     Sensory: Sensation is intact. No sensory deficit.     Motor: Tremor (head) present. No weakness or pronator drift.     Coordination: Coordination is intact. Romberg sign negative. Finger-Nose-Finger Test normal.    ED Results / Procedures / Treatments   Labs (all labs ordered are listed, but only abnormal results are displayed) Labs Reviewed  BASIC METABOLIC PANEL - Abnormal; Notable for the following components:      Result Value   Glucose, Bld 113 (*)    All other components within normal limits  CBC - Abnormal; Notable for the following components:   RDW 16.8 (*)    All other components within normal limits  DIFFERENTIAL -  Abnormal; Notable for the following components:   Abs Immature Granulocytes 0.10 (*)    All other components within normal limits  CBC - Abnormal; Notable for the following components:   RDW 17.1 (*)    All other components within normal limits  CBG MONITORING, ED - Abnormal; Notable for the following components:   Glucose-Capillary 105 (*)    All other components within normal limits  RESP PANEL BY RT-PCR (FLU A&B, COVID) ARPGX2  URINALYSIS, ROUTINE W REFLEX MICROSCOPIC  ETHANOL  RAPID URINE DRUG SCREEN, HOSP PERFORMED  HEPATIC FUNCTION PANEL  PROTIME-INR  APTT    EKG None  Radiology CT ANGIO HEAD NECK W WO CM  Result Date: 10/09/2021 CLINICAL DATA:  Subarachnoid hemorrhage. Dizziness and headache. Fall. EXAM: CT ANGIOGRAPHY HEAD AND NECK TECHNIQUE: Multidetector CT imaging of the head and neck was performed using the standard protocol during bolus administration of intravenous contrast. Multiplanar CT image reconstructions and MIPs were obtained to evaluate the vascular anatomy. Carotid stenosis measurements (when applicable) are obtained utilizing NASCET criteria, using the distal internal carotid diameter as the denominator. RADIATION DOSE REDUCTION: This exam was performed according to the departmental dose-optimization program which includes automated exposure control, adjustment of the mA and/or kV according to patient size and/or use of iterative reconstruction technique. CONTRAST:  46m OMNIPAQUE IOHEXOL 350 MG/ML SOLN COMPARISON:  CT head 10/02/2021 FINDINGS: CT HEAD FINDINGS Brain: Moderate atrophy. Moderate chronic microvascular ischemic change in the white matter. Negative for acute infarct, acute hemorrhage, or mass lesion. Vascular: Negative for hyperdense vessel Skull: Negative Sinuses: Mild mucosal edema paranasal sinuses. Orbits: No orbital lesion.  Bilateral cataract extraction Review of the MIP images confirms the above findings CTA NECK FINDINGS Aortic arch: Mild  atherosclerotic disease aortic arch. Aberrant right subclavian artery. Proximal great vessels patent without stenosis. Right carotid system: Right carotid widely patent. Negative for atherosclerotic disease or dissection. Left carotid system: Left carotid bifurcation widely patent. Eccentric calcific plaque in the internal carotid artery below the skull base without significant stenosis. Negative for dissection. Vertebral  arteries: Proximal right vertebral artery patent. Scattered atherosclerotic disease throughout the right cervical carotid with diffuse narrowing. Multiple areas of moderate stenosis in the right vertebral artery. Proximal left vertebral artery is patent. Moderate to severe stenosis left vertebral artery at the C6 level. Mild stenosis distal left vertebral artery at the C1 level. Skeleton: Multilevel disc and facet degeneration throughout the cervical spine. No acute skeletal abnormality. Other neck: Negative for mass or edema in the neck. Upper chest: Lung apices clear bilaterally. Review of the MIP images confirms the above findings CTA HEAD FINDINGS Anterior circulation: Internal carotid artery widely patent through the skull base and cavernous segment. Mild atherosclerotic disease in the cavernous carotid bilaterally. Anterior and middle cerebral arteries patent bilaterally without stenosis or large vessel occlusion. Negative for aneurysm. Posterior circulation: Both vertebral arteries patent to the basilar. PICA patent bilaterally. Basilar patent. Superior cerebellar and posterior cerebral arteries patent bilaterally without stenosis or large vessel occlusion. Venous sinuses: Normal venous enhancement. Anatomic variants: None Review of the MIP images confirms the above findings IMPRESSION: 1. Negative for intracranial large vessel occlusion or significant stenosis 2. No significant carotid stenosis in the neck 3. Both vertebral arteries are diffusely diseased with moderate stenosis bilaterally.  4. CT head demonstrates no acute abnormality. Atrophy and moderate chronic microvascular ischemic change in the white matter. Electronically Signed   By: Franchot Gallo M.D.   On: 10/09/2021 20:06   DG Chest Portable 1 View  Result Date: 10/09/2021 CLINICAL DATA:  Syncope. EXAM: PORTABLE CHEST 1 VIEW COMPARISON:  Chest radiograph dated 05/31/2021. FINDINGS: No focal consolidation, pleural effusion, pneumothorax. Stable cardiac silhouette. Atherosclerotic calcification of the aorta. No acute osseous pathology. IMPRESSION: No active cardiopulmonary disease. Electronically Signed   By: Anner Crete M.D.   On: 10/09/2021 19:56    Procedures Procedures    Medications Ordered in ED Medications  iohexol (OMNIPAQUE) 350 MG/ML injection 80 mL (80 mLs Intravenous Contrast Given 10/09/21 1927)    ED Course/ Medical Decision Making/ A&P                           Medical Decision Making Amount and/or Complexity of Data Reviewed Labs: ordered. Radiology: ordered.  Risk Prescription drug management. Decision regarding hospitalization.     88 year old female with a history of hypertension, glaucoma, thyroid disease who presents with concern for syncope and episodes of vertigo.  Differential diagnosis for vertigo includes central causes such as stroke, intracranial bleed, mass and peripheral causes such as BPPV, meniere's disease, viral, as well as other causes of dizziness such as anemia, electrolyte abnormality, toxic/metabolic, cardiac arrhythmia.    Differential diagnosis for syncope includes syncope includes cardiac arrhythmia, MI, PE, electrolyte abnormality, hypovolemia including dehydration and anemia/GI bleed, infection, CVA, SAH.  Given concern for HA, dizziness and syncope consider basilar occlusion, SAH, and ordered CTA head and neck.  CTA completed and shows no intracranial large vessel occlusion or significant stenosis, however shows both vertebral arteries are diffusely  diseased with moderate stenosis bilaterally.  CT head was evaluated by me and showed no sign of acute intracranial hemorrhage.  Labs were personally eval by me and show no sign of significant electrolyte abnormality, anemia, leukocytosis, urinalysis shows no acute infection.  Discussed with Dr. Theda Sers of neurology.  He has low suspicion that the stenosis seen in the vertebral arteries is responsible for her vertiginous symptoms, however MRI Brain is appropriate and had been recommended as an outpatient.   Given syncopal  episode with episodes of dizziness, age, feel admission for syncope work-up is indicated, and that she will need MRI brain for further evaluation of vertigo.           Final Clinical Impression(s) / ED Diagnoses Final diagnoses:  Syncope, unspecified syncope type  Vertigo    Rx / DC Orders ED Discharge Orders     None         Gareth Morgan, MD 10/10/21 [phone]

## 2021-10-09 NOTE — ED Notes (Signed)
Patient transported to CT 

## 2021-10-10 ENCOUNTER — Encounter (HOSPITAL_COMMUNITY): Payer: Self-pay | Admitting: Internal Medicine

## 2021-10-10 ENCOUNTER — Observation Stay (HOSPITAL_COMMUNITY): Payer: Medicare HMO

## 2021-10-10 ENCOUNTER — Observation Stay (HOSPITAL_BASED_OUTPATIENT_CLINIC_OR_DEPARTMENT_OTHER): Payer: Medicare HMO

## 2021-10-10 DIAGNOSIS — E1140 Type 2 diabetes mellitus with diabetic neuropathy, unspecified: Secondary | ICD-10-CM | POA: Diagnosis not present

## 2021-10-10 DIAGNOSIS — D72829 Elevated white blood cell count, unspecified: Secondary | ICD-10-CM | POA: Diagnosis not present

## 2021-10-10 DIAGNOSIS — I639 Cerebral infarction, unspecified: Secondary | ICD-10-CM | POA: Insufficient documentation

## 2021-10-10 DIAGNOSIS — R55 Syncope and collapse: Secondary | ICD-10-CM

## 2021-10-10 DIAGNOSIS — Z20822 Contact with and (suspected) exposure to covid-19: Secondary | ICD-10-CM | POA: Diagnosis not present

## 2021-10-10 DIAGNOSIS — I10 Essential (primary) hypertension: Secondary | ICD-10-CM | POA: Diagnosis not present

## 2021-10-10 DIAGNOSIS — R519 Headache, unspecified: Secondary | ICD-10-CM | POA: Diagnosis not present

## 2021-10-10 DIAGNOSIS — Z79899 Other long term (current) drug therapy: Secondary | ICD-10-CM | POA: Diagnosis not present

## 2021-10-10 DIAGNOSIS — W19XXXA Unspecified fall, initial encounter: Secondary | ICD-10-CM | POA: Diagnosis not present

## 2021-10-10 DIAGNOSIS — Y92000 Kitchen of unspecified non-institutional (private) residence as  the place of occurrence of the external cause: Secondary | ICD-10-CM | POA: Diagnosis not present

## 2021-10-10 DIAGNOSIS — Z8673 Personal history of transient ischemic attack (TIA), and cerebral infarction without residual deficits: Secondary | ICD-10-CM | POA: Diagnosis not present

## 2021-10-10 DIAGNOSIS — R42 Dizziness and giddiness: Secondary | ICD-10-CM | POA: Diagnosis not present

## 2021-10-10 DIAGNOSIS — R1310 Dysphagia, unspecified: Secondary | ICD-10-CM | POA: Diagnosis not present

## 2021-10-10 DIAGNOSIS — E876 Hypokalemia: Secondary | ICD-10-CM | POA: Diagnosis not present

## 2021-10-10 DIAGNOSIS — E039 Hypothyroidism, unspecified: Secondary | ICD-10-CM | POA: Diagnosis not present

## 2021-10-10 DIAGNOSIS — G319 Degenerative disease of nervous system, unspecified: Secondary | ICD-10-CM | POA: Diagnosis not present

## 2021-10-10 HISTORY — DX: Personal history of transient ischemic attack (TIA), and cerebral infarction without residual deficits: Z86.73

## 2021-10-10 HISTORY — DX: Cerebral infarction, unspecified: I63.9

## 2021-10-10 LAB — BASIC METABOLIC PANEL
Anion gap: 8 (ref 5–15)
BUN: 14 mg/dL (ref 8–23)
CO2: 28 mmol/L (ref 22–32)
Calcium: 9 mg/dL (ref 8.9–10.3)
Chloride: 104 mmol/L (ref 98–111)
Creatinine, Ser: 0.83 mg/dL (ref 0.44–1.00)
GFR, Estimated: >60 mL/min (ref >60)
Glucose, Bld: 78 mg/dL (ref 70–99)
Potassium: 3.3 mmol/L — ABNORMAL LOW (ref 3.5–5.1)
Sodium: 140 mmol/L (ref 135–145)

## 2021-10-10 LAB — CBC
HCT: 39.7 % (ref 36.0–46.0)
Hemoglobin: 12.7 g/dL (ref 12.0–15.0)
MCH: 28.3 pg (ref 26.0–34.0)
MCHC: 32 g/dL (ref 30.0–36.0)
MCV: 88.4 fL (ref 80.0–100.0)
Platelets: 254 10*3/uL (ref 150–400)
RBC: 4.49 MIL/uL (ref 3.87–5.11)
RDW: 17 % — ABNORMAL HIGH (ref 11.5–15.5)
WBC: 12.2 10*3/uL — ABNORMAL HIGH (ref 4.0–10.5)
nRBC: 0 % (ref 0.0–0.2)

## 2021-10-10 LAB — ECHOCARDIOGRAM COMPLETE
Area-P 1/2: 3.12 cm2
Height: 57 in
S' Lateral: 2.3 cm
Weight: 1872 oz

## 2021-10-10 LAB — TSH: TSH: 5.541 u[IU]/mL — ABNORMAL HIGH (ref 0.350–4.500)

## 2021-10-10 IMAGING — MR MR HEAD W/O CM
9 of 10 series · 37 of 48 positions shown · non-contrast
Comparison: CT head [DATE]

CLINICAL DATA: Dizziness

EXAM:
MRI HEAD WITHOUT CONTRAST
TECHNIQUE: Multiplanar, multiecho pulse sequences of the brain and surrounding
structures were obtained without intravenous contrast.

[Series 2: DWI · axial · 3.0mm · 1.09mm/px · z∈[-78,+63]mm · 11 of 96 slices shown (1 of 4)]
[im 1/96]
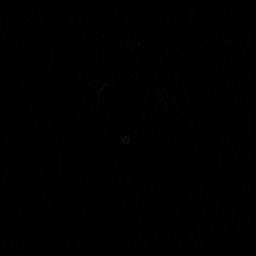
[im 10/96]
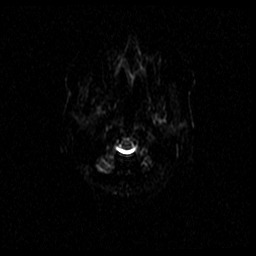
[im 20/96]
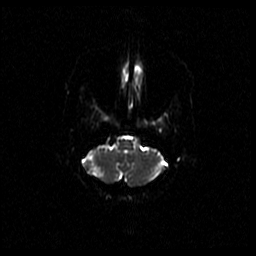
[im 29/96]
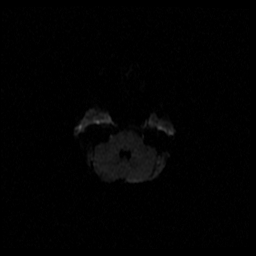
[im 39/96]
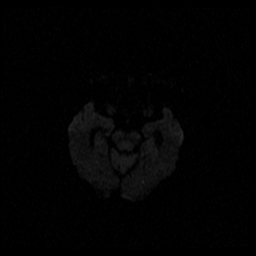
[im 48/96]
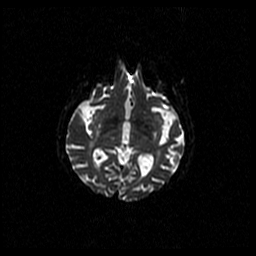
[im 58/96]
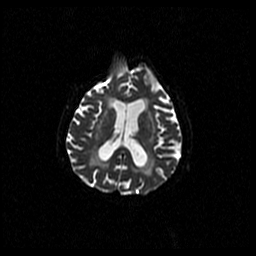
[im 67/96]
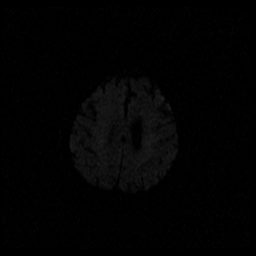
[im 77/96]
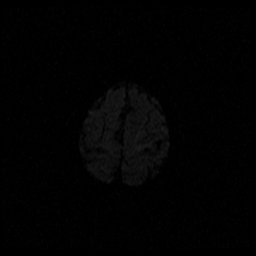
[im 86/96]
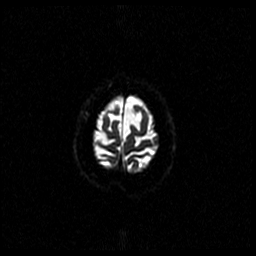
[im 96/96]
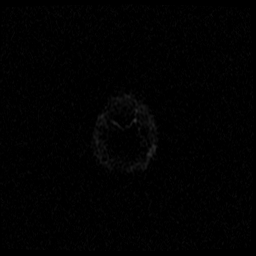

[Series 3: DWI · coronal · 5.0mm · 1.09mm/px · 7 of 66 slices shown (2 of 4)]
[im 1/66]
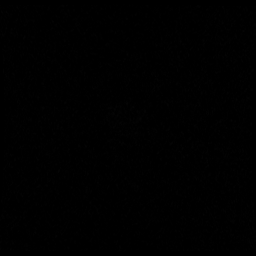
[im 11/66]
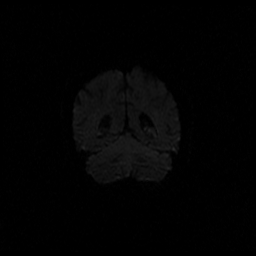
[im 22/66]
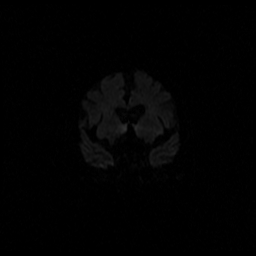
[im 33/66]
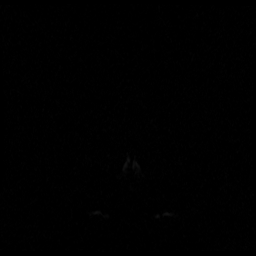
[im 44/66]
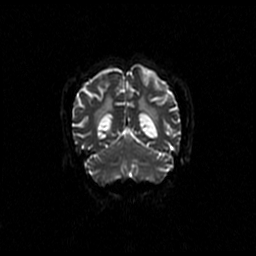
[im 55/66]
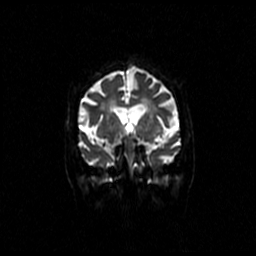
[im 66/66]
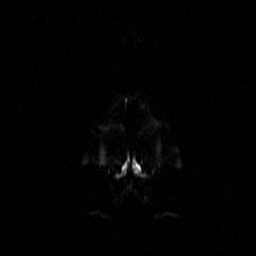

[Series 4: T1 · sagittal · 5.0mm · 0.47mm/px · 3 of 25 slices shown (1 of 2)]
[im 1/25]
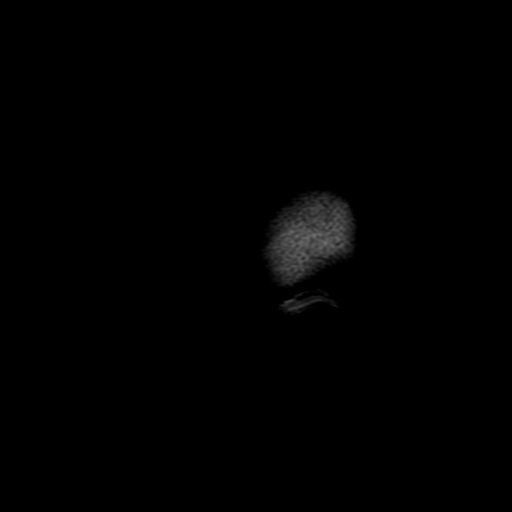
[im 13/25]
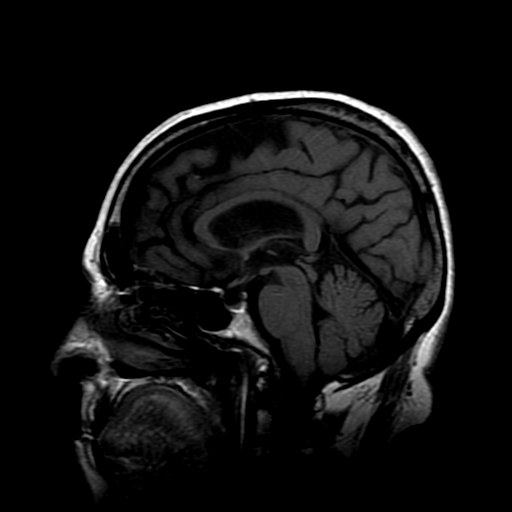
[im 25/25]
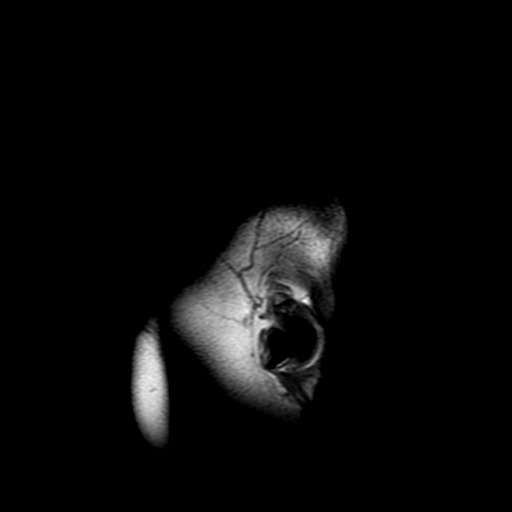

[Series 5: T2 · axial · 5.0mm · 0.43mm/px · z∈[-73,+65]mm · 2 of 24 slices shown (1 of 2)]
[im 1/24]
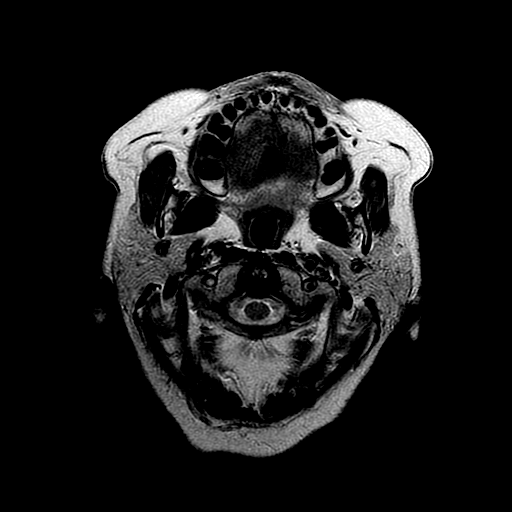
[im 24/24]
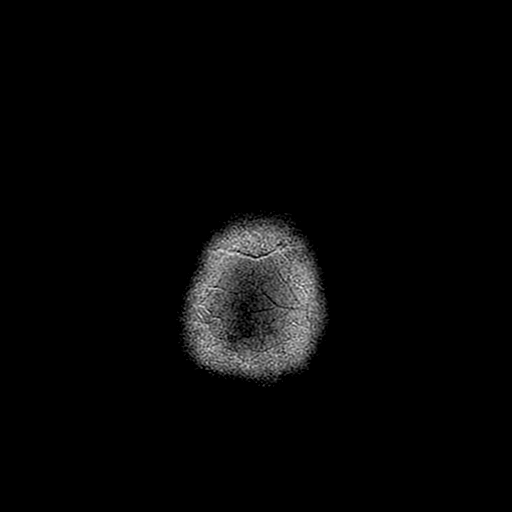

[Series 6: FLAIR · axial · 3.0mm · 0.43mm/px · z∈[-73,+65]mm · 2 of 24 slices shown]
[im 1/24]
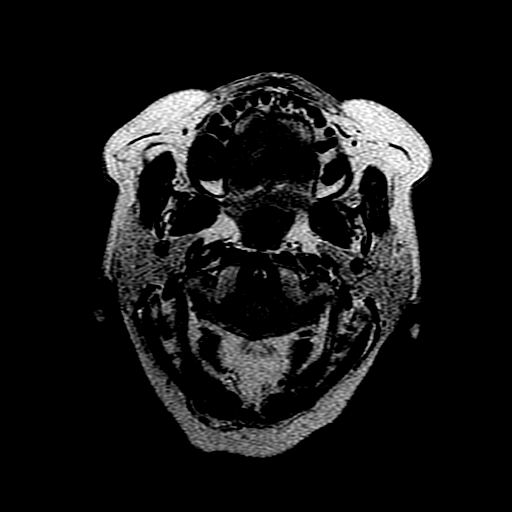
[im 24/24]
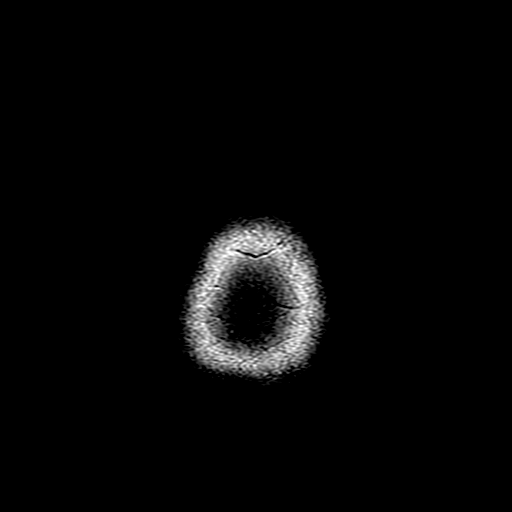

[Series 8: T1 · axial · 3.0mm · 0.47mm/px · 1 of 100 slices shown (2 of 2)]
[im 1/100]
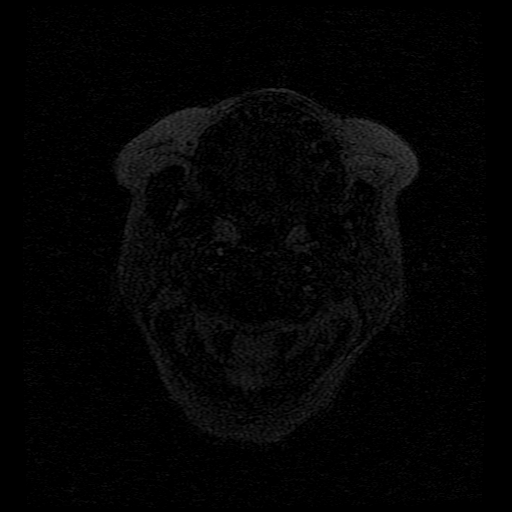

[Series 9: T2 · coronal · 5.0mm · 0.39mm/px · 3 of 25 slices shown (2 of 2)]
[im 1/25]
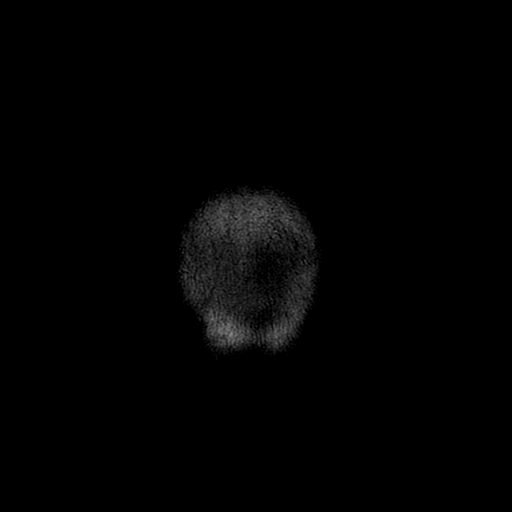
[im 13/25]
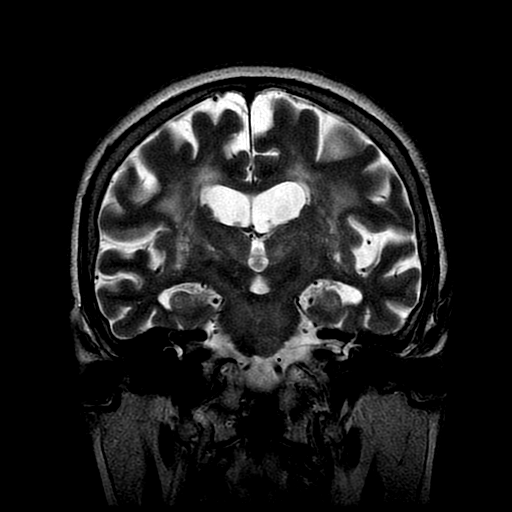
[im 25/25]
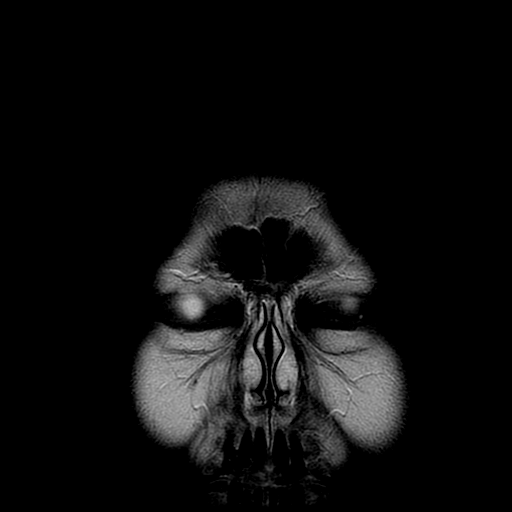

[Series 200: DWI · axial · 3.0mm · 1.09mm/px · z∈[-78,+63]mm · 5 of 48 slices shown (3 of 4)]
[im 1/48]
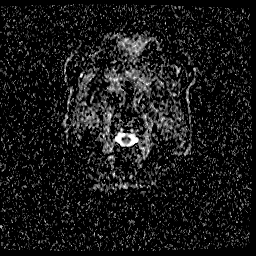
[im 12/48]
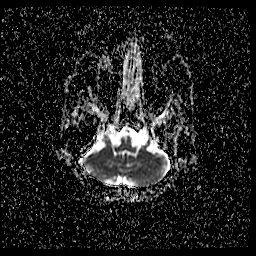
[im 24/48]
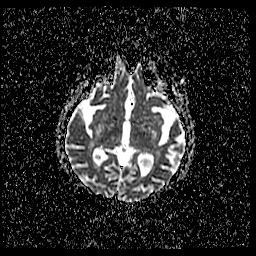
[im 36/48]
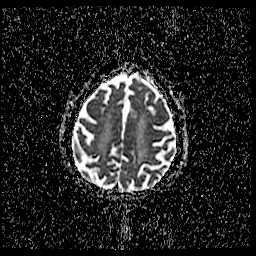
[im 48/48]
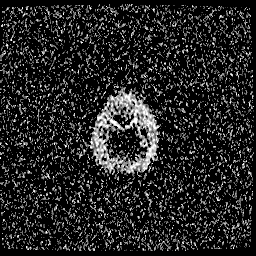

[Series 300: DWI · coronal · 5.0mm · 1.09mm/px · 3 of 33 slices shown (4 of 4)]
[im 1/33]
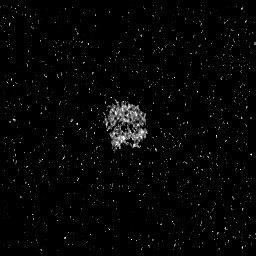
[im 17/33]
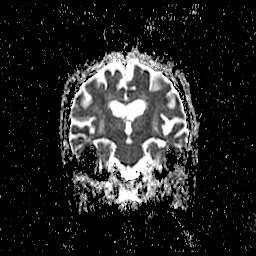
[im 33/33]
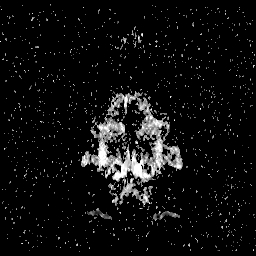

[37 of 48 positions shown; findings below may reference images not displayed]

FINDINGS: Brain: Cluster of small areas of acute infarction in the right
inferior cerebellum. Additional small infarcts in the left medial
thalamus.

Generalized atrophy. Moderate chronic microvascular ischemic change
throughout the white matter. Chronic ischemia in the thalamus and
pons bilaterally. Negative for hemorrhage or mass.

Vascular: Negative for hyperdense sclerotic have normal arterial
flow voids in the skull base.

Skull and upper cervical spine: No focal skeletal abnormality.

Sinuses/Orbits: Mild mucosal edema paranasal sinuses. Bilateral
cataract extraction. Small left mastoid effusion

Other: None
IMPRESSION: Acute infarct right inferior cerebellum.

Small areas of acute infarction left medial thalamus

Atrophy and moderate chronic microvascular ischemic change.

## 2021-10-10 MED ORDER — ACETAMINOPHEN 650 MG RE SUPP: 650.0000 mg | Freq: Four times a day (QID) | RECTAL | Status: DC | PRN

## 2021-10-10 MED ORDER — DORZOLAMIDE HCL-TIMOLOL MAL 2-0.5 % OP SOLN
1.0000 [drp] | Freq: Two times a day (BID) | OPHTHALMIC | Status: DC
  Administered 2021-10-10 – 2021-10-12 (×5): 1 [drp] via OPHTHALMIC
  Filled 2021-10-10: qty 10

## 2021-10-10 MED ORDER — LEVOTHYROXINE SODIUM 50 MCG PO TABS
50.0000 ug | ORAL_TABLET | Freq: Every day | ORAL | Status: DC
  Administered 2021-10-10 – 2021-10-12 (×3): 50 ug via ORAL
  Filled 2021-10-10 (×3): qty 1

## 2021-10-10 MED ORDER — SENNOSIDES-DOCUSATE SODIUM 8.6-50 MG PO TABS: 1.0000 | ORAL_TABLET | Freq: Every evening | ORAL | Status: DC | PRN

## 2021-10-10 MED ORDER — MECLIZINE HCL 12.5 MG PO TABS
12.5000 mg | ORAL_TABLET | Freq: Three times a day (TID) | ORAL | Status: DC | PRN
  Filled 2021-10-10: qty 1

## 2021-10-10 MED ORDER — LACTATED RINGERS IV SOLN: INTRAVENOUS | Status: DC

## 2021-10-10 MED ORDER — MELATONIN 3 MG PO TABS: 3.0000 mg | ORAL_TABLET | Freq: Every evening | ORAL | Status: DC | PRN

## 2021-10-10 MED ORDER — PREDNISONE 5 MG PO TABS
5.0000 mg | ORAL_TABLET | Freq: Every day | ORAL | Status: DC
  Administered 2021-10-11 – 2021-10-12 (×2): 5 mg via ORAL
  Filled 2021-10-10 (×3): qty 1

## 2021-10-10 MED ORDER — TIMOLOL MALEATE 0.5 % OP SOLN: 1.0000 [drp] | Freq: Every day | OPHTHALMIC | Status: DC

## 2021-10-10 MED ORDER — ACETAMINOPHEN 325 MG PO TABS
650.0000 mg | ORAL_TABLET | Freq: Four times a day (QID) | ORAL | Status: DC | PRN
  Administered 2021-10-11: 650 mg via ORAL
  Filled 2021-10-10: qty 2

## 2021-10-10 MED ORDER — ONDANSETRON HCL 4 MG PO TABS: 4.0000 mg | ORAL_TABLET | Freq: Four times a day (QID) | ORAL | Status: DC | PRN

## 2021-10-10 MED ORDER — POTASSIUM CHLORIDE CRYS ER 20 MEQ PO TBCR
20.0000 meq | EXTENDED_RELEASE_TABLET | Freq: Once | ORAL | Status: AC
  Administered 2021-10-10: 20 meq via ORAL
  Filled 2021-10-10: qty 1

## 2021-10-10 MED ORDER — IRBESARTAN 75 MG PO TABS
75.0000 mg | ORAL_TABLET | Freq: Every day | ORAL | Status: DC
  Administered 2021-10-10 – 2021-10-12 (×3): 75 mg via ORAL
  Filled 2021-10-10 (×3): qty 1

## 2021-10-10 MED ORDER — GABAPENTIN 100 MG PO CAPS
100.0000 mg | ORAL_CAPSULE | Freq: Every day | ORAL | Status: DC
  Administered 2021-10-10 – 2021-10-11 (×2): 100 mg via ORAL
  Filled 2021-10-10 (×2): qty 1

## 2021-10-10 MED ORDER — ASPIRIN EC 81 MG PO TBEC
81.0000 mg | DELAYED_RELEASE_TABLET | Freq: Every day | ORAL | Status: DC
  Administered 2021-10-10 – 2021-10-12 (×3): 81 mg via ORAL
  Filled 2021-10-10 (×3): qty 1

## 2021-10-10 MED ORDER — AMLODIPINE BESYLATE 5 MG PO TABS
5.0000 mg | ORAL_TABLET | Freq: Every day | ORAL | Status: DC
  Administered 2021-10-10 – 2021-10-12 (×3): 5 mg via ORAL
  Filled 2021-10-10 (×3): qty 1

## 2021-10-10 MED ORDER — ONDANSETRON HCL 4 MG/2ML IJ SOLN: 4.0000 mg | Freq: Four times a day (QID) | INTRAMUSCULAR | Status: DC | PRN

## 2021-10-10 MED ORDER — METOPROLOL TARTRATE 50 MG PO TABS
50.0000 mg | ORAL_TABLET | Freq: Two times a day (BID) | ORAL | Status: DC
  Administered 2021-10-10 – 2021-10-12 (×5): 50 mg via ORAL
  Filled 2021-10-10 (×5): qty 1

## 2021-10-10 NOTE — Progress Notes (Signed)
Plan of Care Note for accepted transfer ? ? ?Patient: Leslie Ward MRN: [id number]   DOA: 10/09/2021 ? ?Facility requesting transfer: [facility] ?Requesting Provider: Dr. Dalene Seltzer ?Reason for transfer: Syncope ?Facility course:  ? ?88 year old female with past medical history of Hypertension, diabetes mellitus type 2, diabetic polyneuropathy, hypothyroidism, and previous stroke 11/2019 who presents to med [facility] emergency department with a 1 week history of ongoing vertigo and headaches. ? ?Patient was eventually brought in after the patient was found down by her daughter-in-law in the kitchen.  Fall was not witnessed but ER provider believes that this was an episode of syncope. ? ?Upon evaluation in the emergency department CT angiogram of the head was performed revealing that both vertebral arteries were diffusely diseased with moderate stenosis bilaterally.  ER provider discussed case with Dr. Thomasena Edis with neurology who did not feel that the CT angiogram findings were significant.   ? ?ER provider is still concerned about the patient's suspected syncope and is asking the hospitalist group to accept the patient for transfer to our acute care facility for continued syncope work-up. ? ? ?Plan of care: ?The patient is accepted for admission to Telemetry unit, at [facility]..  ? ? ?Author: ?Marinda Elk, MD ?10/10/2021 ? ?Check [email or web address] for on-call coverage. ? ?Nursing staff, Please call [facility] Admits & Consults System-Wide number on Amion as soon as patient's arrival, so appropriate admitting provider can evaluate the pt. ?

## 2021-10-10 NOTE — Assessment & Plan Note (Addendum)
Leslie Ward is placed on medical telemetry for observation.   ?Possible syncope at home.  Was not witnessed and pt cannot accurately describe other than she got dizzy when she stood up and then was on the floor.  ?Check Orthostatic BP readings.  ?Had CTA head and neck that showed atherosclerosis but no significant LVO. ER physician discussed with neurology who felt was chronic and not significant. ?Check Echocardiogram to evaluate wall motion, valve function and EF ?States has had symptoms for past few weeks.  ?Obtain MRI brain to rule out cerebellar Stroke as etiology ?

## 2021-10-10 NOTE — Progress Notes (Signed)
PROGRESS NOTE ? ?Brief Narrative: ?Leslie Ward is an 88 y.o. female with a history of CVA April 2021, diet-controlled T2DM, hypothyroidism, HTN, glaucoma who presented to the ED at [facility] after being found down in the kitchen after an unwitnessed fall with uncertain etiology. The patient has amnesia for how she ended up on the floor, but has feeling very dizzy for several weeks with intermittent headache, having been evaluated by ENT and ophthalmology as an outpatient with some suspicion for BPPV. She was afebrile with NSR on ECG, labs largely unremarkable. CT head in the ED revealed no acute abnormalities. CTA head and neck demonstrated moderate bilateral vertebral artery atherosclerosis, for which neurology was consulted, not suspecting that this is a causative or significant finding.  ? ?Subjective: ?No current headache or visual changes. Got up with mobility specialist earlier today. No dizziness at that time or currently. Speaks English. ? ?Objective: ?BP (!) 149/59 (BP Location: Right Arm)   Pulse 69   Temp 97.7 ?F (36.5 ?C) (Oral)   Resp 16   Ht 4\' 9"  (1.448 m)   Wt 53.1 kg   SpO2 96%   BMI 25.32 kg/m?   ?Gen: Elderly, pleasant female in no distress.  ?Pulm: Clear and nonlabored on room air  ?CV: RRR, no murmur, no JVD, no edema ?GI: Soft, NT, ND, +BS  ?Neuro: Alert and oriented. No focal deficits. +nystagmus bilaterally. ?Skin: No rashes, lesions or ulcers on visualized skin. ? ?Assessment & Plan: ?Principal Problem: ?  Syncope ?Active Problems: ?  Hypertension ?  Type 2 diabetes mellitus ([place]) ?  Hypothyroidism (acquired) ?  Vertigo ? ?Fall: Concern for syncope due to amnesia for the event. Dizziness subacutely worsening and +nystagmus on exam is noted.  ?- Agree that brain MRI, as recommended previously by outpatient physicians, and as ordered by admitting physician, is appropriate and necessary in this setting.  ?- No signs or symptoms of heart failure or severe valvular disease on exam at this  time. ECG without ischemic changes or ectopy. Will continue telemetry monitoring (only began this at admission earlier this morning), follow up echocardiogram result. ?- PT/OT, vestibular evaluations requested and pending.  ? ?Headaches: Concern for glaucoma per outpatient providers.  ?- Continue gtt's, f/u with ophthalmology.  ? ?Inflammatory arthritis: Followed by Dr. Benjamine Mola.  ?- Continue chronic prednisone.  ? ?Hypothyroidism: Very mildly elevated TSH, of unclear significance in acute setting.  ?- Will recommend repeat testing at follow up.  ? ?Hypokalemia: Mild ?- Supplement ? ?Leukocytosis: Chronic, intermittent, in setting of chronic prednisone use, currently without fever. No pyuria, CXR infiltrate, cutaneous infectious signs or symptoms, or meningismus.  ?- Monitor off abx for now. ? ?T2DM with neuropathy:  ?- Continue carb modified diet, low dose gabapentin ? ?HTN:  ?- Continue metoprolol, ARB ? ?Patrecia Pour, MD ?Pager on amion ?10/10/2021, 11:52 AM   ?

## 2021-10-10 NOTE — Plan of Care (Signed)
PT admitted for fall from home. Pt A&OX4. BP (!) 149/63 (BP Location: Right Arm)   Pulse 75   Temp 97.9 ?F (36.6 ?C) (Oral)   Resp 14   Ht 4\' 9"  (1.448 m)   Wt 53.1 kg   SpO2 97%   BMI 25.32 kg/m?  Pt oriented to floor  ?WNL No c/o pain, no skin issues. Bed alarm on, bed in lowest position, 2/4 rails up and call bell near by. Pt on telemetry with continuous pulse ox. ? ?10/10/21 ?3:02 AM ? ?

## 2021-10-10 NOTE — Assessment & Plan Note (Signed)
Continue levothyroxine 

## 2021-10-10 NOTE — Assessment & Plan Note (Addendum)
Check orthostatic BP ?Per family report to ER physician has had workup for vertigo by ENT and opthamalogy and were planning on getting an outpatient MRI brain ?

## 2021-10-10 NOTE — Assessment & Plan Note (Signed)
Diabetes is diet controlled.  ?Check blood sugar in am ?

## 2021-10-10 NOTE — Assessment & Plan Note (Signed)
Continue norvasc, micardis, metoprolol ?Monitor BP.  ?

## 2021-10-10 NOTE — ED Notes (Signed)
Pt's son to desk. Son states he has turned off pt's implanted stimulator so she is now safe to have MRI.   ?

## 2021-10-10 NOTE — H&P (Signed)
History and Physical    Patient: Leslie Ward PTW:[id number] DOB: 1931-03-28 DOA: 10/09/2021 DOS: the patient was seen and examined on 10/10/2021 PCP: Mackie Pai, PA-C  Patient coming from: Home  Chief Complaint:  Chief Complaint  Patient presents with   Fall    HPI: Leslie Ward is a 88 y.o. female with medical history significant of HTN, Diet controlled DMT2, Hypothyroidism, glaucoma who presents with complaint of syncope.  She reports she has been having intermittent episodes of headache and dizziness 2-3 times a day for the past week.  Family states she has been having dizziness and vertigo for the last few months and has been evaluated by ophthalmology and ENT with no diagnosis for the reason of the vertigo.  They reported to the ER physician that an MRI of the brain was going to be scheduled as an outpatient for further evaluation.  Reports that yesterday morning she was making her bed when she had an episode of dizziness that lasted for 30 to 60 seconds and then improved spontaneously.  She was then sitting at the kitchen table and was reading when she developed a headache.  When she stood up she felt dizzy and off-balance and then she was on the floor.  She does not remember how she got on the floor.  Family leave that she had a syncopal episode but the event was not witnessed.  She denies any injury or pain from the fall.  Reportedly she has been having episodes of dizziness and headache off and on for the last few weeks.  She denies having any chest pain or palpitations and has not had any nausea vomiting diarrhea neck pain fever chills cough or shortness of breath.  She denies any urinary symptoms and had no loss of urine or bowels when the event happened.  She does not have any trauma to her tongue or inside of her mouth.  She states she has never had seizures.  Family reported to the ER physician that ENT was because she might have BPPV.  She does state that when she gets  dizzy her vision will become blurry for a few seconds and then resolved. This time she has normal vision normal speech and does not have a headache. Labs and records from the ER are reviewed.  Review of Systems: As mentioned in the history of present illness. All other systems reviewed and are negative. Past Medical History:  Diagnosis Date   Dystonia    Glaucoma    Hypertension    Thyroid disease    Past Surgical History:  Procedure Laterality Date   NO PAST SURGERIES     Social History:  reports that she has never smoked. She has never used smokeless tobacco. She reports that she does not drink alcohol and does not use drugs.  Allergies  Allergen Reactions   Crestor [Rosuvastatin Calcium] Other (See Comments)    "caused me to go into kidney failure"    Family History  Problem Relation Age of Onset   Stroke Mother    Heart attack Father    Heart Problems Brother     Prior to Admission medications   Medication Sig Start Date End Date Taking? Authorizing Provider  amLODipine (NORVASC) 5 MG tablet TAKE 1 TABLET(5 MG) BY MOUTH TWICE DAILY 01/02/21  Yes Bettina Gavia Hilton Cork, MD  blood glucose meter kit and supplies Dispense based on patient and insurance preference. Use up to four times daily as directed. (FOR ICD-10 E10.9, E11.9). 08/17/21  Yes  Saguier, Percell Miller, PA-C  Calcium Carbonate (CALCIUM 600 PO) Take 2 tablets by mouth daily.   Yes [provider]  Calcium Polycarbophil (FIBER-CAPS PO) Take 1 capsule by mouth 2 (two) times a day.   Yes [provider]  Cinnamon 500 MG capsule Take 1,000 mg by mouth daily.   Yes [provider]  dorzolamide-timolol (COSOPT) 22.3-6.8 MG/ML ophthalmic solution Place 1 drop into both eyes 2 times daily. 07/09/21  Yes [provider]  fluticasone (FLONASE) 50 MCG/ACT nasal spray Place 2 sprays into both nostrils daily. 04/09/21  Yes Saguier, Percell Miller, PA-C  gabapentin (NEURONTIN) 100 MG capsule Take 1 capsule (100 mg  total) by mouth at bedtime. 11/20/20  Yes Saguier, Percell Miller, PA-C  incobotulinumtoxinA [facility] 100 units SOLR injection Inject 100 Units into the muscle every 3 (three) months.   Yes [provider]  Lancets (ONETOUCH DELICA PLUS [id number]) MISC USE TO CHECK BLOOD SUGAR UP TO FOUR TIMES DAILY AS DIRECTED 09/20/21  Yes Saguier, Percell Miller, PA-C  levothyroxine (SYNTHROID) 50 MCG tablet TAKE ONE TABLET BY MOUTH DAILY BEFORE BREAKFAST 07/31/21  Yes Saguier, Percell Miller, PA-C  meclizine (ANTIVERT) 12.5 MG tablet Take 1 tablet (12.5 mg total) by mouth 3 (three) times daily as needed for dizziness. 10/01/21  Yes Saguier, Percell Miller, PA-C  MELATONIN PO Take 2-3 tablets by mouth at bedtime as needed (sleep).    Yes [provider]  Menaquinone-7 (VITAMIN K2 PO) Take 1 tablet by mouth at bedtime.   Yes [provider]  metoprolol tartrate (LOPRESSOR) 50 MG tablet TAKE ONE TABLET BY MOUTH TWICE A DAY 06/01/21  Yes Saguier, Percell Miller, PA-C  Multiple Vitamin (MULTIVITAMIN) tablet Take 1 tablet by mouth daily.   Yes [provider]  Omega-3 Fatty Acids (OMEGA 3 PO) Take 2 tablets by mouth every evening.    Yes [provider]  Phytosterol Esters (CHOLEST CARE PO) Take 1,600 mg by mouth daily.   Yes [provider]  predniSONE (DELTASONE) 5 MG tablet Take 1 tablet (5 mg total) by mouth daily with breakfast. 09/26/21  Yes Rice, Resa Miner, MD  Probiotic Product (PROBIOTIC DAILY PO) Take 1 tablet by mouth daily.   Yes [provider]  telmisartan (MICARDIS) 20 MG tablet Take 1 tablet (20 mg total) by mouth daily. 11/09/20  Yes Richardo Priest, MD  timolol (TIMOPTIC) 0.5 % ophthalmic solution SMARTSIG:In Eye(s) 06/20/21  Yes [provider]  vitamin C (ASCORBIC ACID) 500 MG tablet Take 500 mg by mouth every evening.    Yes [provider]  meclizine (ANTIVERT) 12.5 MG tablet Take 1 tablet (12.5 mg total) by mouth 3 (three) times daily as needed for  dizziness. Patient not taking: Reported on 08/24/2021 08/16/21   Saguier, Percell Miller, PA-C  [facility] ULTRA test strip  08/21/21   [provider]    Physical Exam: Vitals:   10/10/21 0030 10/10/21 0045 10/10/21 0100 10/10/21 0200  BP: (!) 134/54 (!) 132/50 (!) 126/53 (!) 149/63  Pulse: 70 69 68 75  Resp: (!) 21 (!) 24 (!) 22 14  Temp:    97.9 F (36.6 C)  TempSrc:    Oral  SpO2: 95% 93% 92% 97%  Weight:      Height:       General: WDWN, Alert and oriented x3.  Eyes: EOMI, PERRL, conjunctivae normal.  Sclera nonicteric.  No nystagmus HENT:  [place]/AT, external ears normal.  Nares patent without epistasis.  Mucous membranes are moist. Posterior pharynx clear of any exudate Neck:  Soft, normal range of motion, supple, no masses, Trachea midline Respiratory: clear to auscultation bilaterally, no wheezing, no crackles. Normal respiratory effort. Cardiovascular: Regular rate and rhythm, no murmurs / rubs / gallops. No extremity edema. 2+ pedal pulses. Abdomen: Soft, no tenderness, nondistended, no rebound or guarding.  No masses palpated. Bowel sounds normoactive Musculoskeletal: FROM. no cyanosis. No joint deformity upper and lower extremities. Normal muscle tone.  Skin: Warm, dry, intact no rashes. No induration Neurologic: CN 2-12 grossly intact. Normal speech. Sensation intact, patella DTR +1 bilaterally. Strength 5/5 in all extremities. No tremor Psychiatric: Normal mood.   Data Reviewed: Lab Work: CBC unremarkable sodium 135 potassium 4.1 chloride 102 bicarb 26 creatinine 0.9 BUN 22 calcium 9.3 glucose 113 LFTs normal INR 1.0 COVID-negative influenza A and B urinalysis negative alcohol level negative UDS negative  Chest x-ray-physically without infiltrate, consolidation, pleural effusion, pulmonary edema or pneumothorax  CT head and CT angiography of head and neck-no acute intracranial process.  No significant large vessel occlusion.  There is bilateral vertebral artery  atherosclerosis.  No fracture  Assessment and Plan: * Syncope- (present on admission) Ms. Schwebach is placed on medical telemetry for observation.   Possible syncope at home.  Was not witnessed and pt cannot accurately describe other than she got dizzy when she stood up and then was on the floor.  Check Orthostatic BP readings.  Had CTA head and neck that showed atherosclerosis but no significant LVO. ER physician discussed with neurology who felt was chronic and not significant. Check Echocardiogram to evaluate wall motion, valve function and EF States has had symptoms for past few weeks.  Obtain MRI brain to rule out cerebellar Stroke as etiology  Hypertension- (present on admission) Continue norvasc, micardis, metoprolol Monitor BP.   Type 2 diabetes mellitus ([place]) Diabetes is diet controlled.  Check blood sugar in am  Vertigo Check orthostatic BP Per family report to ER physician has had workup for vertigo by ENT and opthamalogy and were planning on getting an outpatient MRI brain  Hypothyroidism (acquired)- (present on admission) Continue levothyroxine   Advance Care Planning:   Code Status: Full Code SCDs for DVT prophylaxis  Family Communication: Diagnosis and plan discussed with patient.  She verbalized understanding agrees with plan.  Questions answered.  Further recommendations to follow as clinically indicated  Author: Eben Burow, MD 10/10/2021 4:14 AM  For on call review [email or web address].

## 2021-10-10 NOTE — Progress Notes (Signed)
Mobility Specialist Progress Note: ? ? 10/10/21 1030  ?Mobility  ?Activity Ambulated with assistance in hallway  ?Level of Assistance Contact guard assist, steadying assist  ?Assistive Device Front wheel walker  ?Distance Ambulated (ft) 100 ft  ?Activity Response Tolerated well  ?$Mobility charge 1 Mobility  ? ?Pt received in bed willing to participate in mobility. No complaints of pain. Pt left in bed with call bell in reach and all needs met.  ? ?Lesle Faron ?Mobility Specialist ?Primary Phone [phone] ? ?

## 2021-10-10 NOTE — Progress Notes (Signed)
?  Echocardiogram ?2D Echocardiogram has been performed. ? ?Leslie Ward ?10/10/2021, 9:55 AM ?

## 2021-10-11 ENCOUNTER — Telehealth: Payer: Self-pay | Admitting: Neurology

## 2021-10-11 ENCOUNTER — Institutional Professional Consult (permissible substitution): Payer: Medicare HMO | Admitting: Neurology

## 2021-10-11 DIAGNOSIS — E039 Hypothyroidism, unspecified: Secondary | ICD-10-CM | POA: Diagnosis not present

## 2021-10-11 DIAGNOSIS — E119 Type 2 diabetes mellitus without complications: Secondary | ICD-10-CM | POA: Diagnosis not present

## 2021-10-11 DIAGNOSIS — I639 Cerebral infarction, unspecified: Secondary | ICD-10-CM

## 2021-10-11 DIAGNOSIS — I10 Essential (primary) hypertension: Secondary | ICD-10-CM

## 2021-10-11 DIAGNOSIS — I63441 Cerebral infarction due to embolism of right cerebellar artery: Secondary | ICD-10-CM | POA: Diagnosis not present

## 2021-10-11 DIAGNOSIS — R42 Dizziness and giddiness: Secondary | ICD-10-CM | POA: Diagnosis not present

## 2021-10-11 DIAGNOSIS — R55 Syncope and collapse: Secondary | ICD-10-CM | POA: Diagnosis not present

## 2021-10-11 LAB — LIPID PANEL
Cholesterol: 225 mg/dL — ABNORMAL HIGH (ref 0–200)
HDL: 72 mg/dL (ref >40)
LDL Cholesterol: 127 mg/dL — ABNORMAL HIGH (ref 0–99)
Total CHOL/HDL Ratio: 3.1 RATIO
Triglycerides: 129 mg/dL (ref <150)
VLDL: 26 mg/dL (ref 0–40)

## 2021-10-11 LAB — HEMOGLOBIN A1C
Hgb A1c MFr Bld: 6 % — ABNORMAL HIGH (ref 4.8–5.6)
Mean Plasma Glucose: 125.5 mg/dL

## 2021-10-11 MED ORDER — ENOXAPARIN SODIUM 30 MG/0.3ML IJ SOSY
30.0000 mg | PREFILLED_SYRINGE | INTRAMUSCULAR | Status: DC
  Administered 2021-10-11 – 2021-10-12 (×2): 30 mg via SUBCUTANEOUS
  Filled 2021-10-11 (×2): qty 0.3

## 2021-10-11 MED ORDER — NAPHAZOLINE-GLYCERIN 0.012-0.25 % OP SOLN
1.0000 [drp] | Freq: Four times a day (QID) | OPHTHALMIC | Status: DC | PRN
  Filled 2021-10-11: qty 15

## 2021-10-11 NOTE — Evaluation (Signed)
Clinical/Bedside Swallow Evaluation ?Patient Details  ?Name: Leslie Ward ?MRN: [id number] ?Date of Birth: 26-Oct-1930 ? ?Today's Date: 10/11/2021 ?Time: SLP Start Time (ACUTE ONLY): 1300 SLP Stop Time (ACUTE ONLY): [id number] ?SLP Time Calculation (min) (ACUTE ONLY): 15 min ? ?Past Medical History:  ?Past Medical History:  ?Diagnosis Date  ? Dystonia   ? Glaucoma   ? Hypertension   ? Thyroid disease   ? ?Past Surgical History:  ?Past Surgical History:  ?Procedure Laterality Date  ? NO PAST SURGERIES    ? ?HPI:  ?88 y.o. female with recent history of dizziness and vertigo with concerns for BPPV who presented to the ED after an unwitnessed fall. 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   - Examination is without focal neurologic deficit. Pt passed [place], SLP ordered 3/2.  ?  ?Assessment / Plan / Recommendation  ?Clinical Impression ? Pt demonstrates no swallowing impairment. Tolerates regular solids and thin liquids. Pt denies cognitive impairment, but is observed to have short term memory impairment. SHe does report appropriate assist from her son for finances, medication management, apppointments and transportation. Called son to confirm, but he did not answer. Will addend note if son calls back. No f/u needed with SLP at this time. Will sign off. ?SLP Visit Diagnosis: Dysphagia, unspecified (R13.10) ?   ?Aspiration Risk ?    ?  ?Diet Recommendation Regular;Thin liquid  ? ?   ?  ?Other  Recommendations     ? ?Recommendations for follow up therapy are one component of a multi-disciplinary discharge planning process, led by the attending physician.  Recommendations may be updated based on patient status, additional functional criteria and insurance authorization. ? ?Follow up Recommendations    ? ? ?  ?Assistance Recommended at Discharge    ?Functional Status Assessment    ?Frequency and Duration    ?  ?  ?   ? ?Prognosis    ? ?  ? ?Swallow Study   ?General HPI: 88 y.o. female with  recent history of dizziness and vertigo with concerns for BPPV who presented to the ED after an unwitnessed fall. 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   - Examination is without focal neurologic deficit. Pt passed [place], SLP ordered 3/2. ?Type of Study: Bedside Swallow Evaluation ?Previous Swallow Assessment: none ?Diet Prior to this Study: Regular;Thin liquids ?Temperature Spikes Noted: No ?Respiratory Status: Room air ?History of Recent Intubation: No ?Behavior/Cognition: Alert;Cooperative;Pleasant mood ?Oral Cavity Assessment: Within Functional Limits ?Oral Care Completed by SLP: No ?Oral Cavity - Dentition: Adequate natural dentition ?Vision: Functional for self-feeding ?Self-Feeding Abilities: Able to feed self ?Patient Positioning: Upright in bed ?Baseline Vocal Quality: Normal ?Volitional Cough: Strong ?Volitional Swallow: Able to elicit  ?  ?Oral/Motor/Sensory Function Overall Oral Motor/Sensory Function: Within functional limits   ?Ice Chips     ?Thin Liquid Thin Liquid: Within functional limits  ?  ?Nectar Thick Nectar Thick Liquid: Not tested   ?Honey Thick Honey Thick Liquid: Not tested   ?Puree Puree: Within functional limits   ?Solid ? ? ?  Solid: Within functional limits  ? ?  ? ?[place], Katherene Ponto ?10/11/2021,1:54 PM ? ? ? ?

## 2021-10-11 NOTE — Telephone Encounter (Signed)
Pt's son, Jacqulyn Bath (on [place]) cancelling appt due to pt has been admitted to [facility] due dizziness.  ?

## 2021-10-11 NOTE — Consult Note (Signed)
Neurology Consultation  Reason for Consult: MRI with acute infarction  Referring Physician: Dr. Bonner Puna  CC: Recent fall   History is obtained from: Patient, chart review  HPI: Leslie Ward is a 88 y.o. female with a medical history significant for essential hypertension, CVA April 2021, type 2 diabetes diet-controlled, and glaucoma who presented to the ED on 2/28 for evaluation of an unwitnessed fall where the patient was unable to get up.  Patient complains that she has had multiple daily episodes of headaches and dizziness with visual changes for approximately 1 to 2 weeks though on chart review, patient has been seen as far back as January by an ENT regarding episodes of dizziness concerning for BPPV.  Family endorses that she has had a few months of vertigo with plans for an outpatient MRI for further evaluation. On 2/28, patient states that she woke up and had breakfast and then was sitting at her kitchen table reading and writing when she began to have a bitemporal headache. The next that the patient could remember was waking up on the floor and she unable to get up.  She is unable to state how long that she was on floor or if she lost consciousness prior to falling.   ROS: A complete ROS was performed and is negative except as noted in the HPI.   Past Medical History:  Diagnosis Date   Dystonia    Glaucoma    Hypertension    Thyroid disease    Past Surgical History:  Procedure Laterality Date   NO PAST SURGERIES     Family History  Problem Relation Age of Onset   Stroke Mother    Heart attack Father    Heart Problems Brother    Social History:   reports that she has never smoked. She has never used smokeless tobacco. She reports that she does not drink alcohol and does not use drugs. Medications  Current Facility-Administered Medications:    acetaminophen (TYLENOL) tablet 650 mg, 650 mg, Oral, Q6H PRN **OR** acetaminophen (TYLENOL) suppository 650 mg, 650 mg, Rectal, Q6H  PRN, Chotiner, Yevonne Aline, MD   amLODipine (NORVASC) tablet 5 mg, 5 mg, Oral, Daily, Chotiner, Yevonne Aline, MD, 5 mg at 10/11/21 [id number]   aspirin EC tablet 81 mg, 81 mg, Oral, Daily, Chotiner, Yevonne Aline, MD, 81 mg at 10/11/21 0833   dorzolamide-timolol (COSOPT) 22.3-6.8 MG/ML ophthalmic solution 1 drop, 1 drop, Both Eyes, BID, Chotiner, Yevonne Aline, MD, 1 drop at 10/11/21 [id number]   gabapentin (NEURONTIN) capsule 100 mg, 100 mg, Oral, QHS, Chotiner, Yevonne Aline, MD, 100 mg at 10/10/21 2243   irbesartan (AVAPRO) tablet 75 mg, 75 mg, Oral, Daily, Chotiner, Yevonne Aline, MD, 75 mg at 10/11/21 [id number]   lactated ringers infusion, , Intravenous, Continuous, Chotiner, Yevonne Aline, MD, Last Rate: 75 mL/hr at 10/11/21 0538, New Bag at 10/11/21 0538   levothyroxine (SYNTHROID) tablet 50 mcg, 50 mcg, Oral, Daily, Chotiner, Yevonne Aline, MD, 50 mcg at 10/11/21 0531   meclizine (ANTIVERT) tablet 12.5 mg, 12.5 mg, Oral, TID PRN, Chotiner, Yevonne Aline, MD   melatonin tablet 3 mg, 3 mg, Oral, QHS PRN, Chotiner, Yevonne Aline, MD   metoprolol tartrate (LOPRESSOR) tablet 50 mg, 50 mg, Oral, BID, Chotiner, Yevonne Aline, MD, 50 mg at 10/11/21 0833   ondansetron (ZOFRAN) tablet 4 mg, 4 mg, Oral, Q6H PRN **OR** ondansetron (ZOFRAN) injection 4 mg, 4 mg, Intravenous, Q6H PRN, Chotiner, Yevonne Aline, MD   predniSONE (DELTASONE) tablet 5 mg, 5 mg, Oral, Q  breakfast, Tyrone Nine, MD, 5 mg at 10/11/21 6063   senna-docusate (Senokot-S) tablet 1 tablet, 1 tablet, Oral, QHS PRN, Chotiner, Claudean Severance, MD  Exam: Current vital signs: BP (!) 157/68 (BP Location: Right Arm)    Pulse 72    Temp 98 F (36.7 C) (Oral)    Resp 17    Ht 4\' 9"  (1.448 m)    Wt 53.1 kg    SpO2 97%    BMI 25.32 kg/m  Vital signs in last 24 hours: Temp:  [97.5 F (36.4 C)-98.2 F (36.8 C)] 98 F (36.7 C) (03/02 0832) Pulse Rate:  [72-73] 72 (03/02 0832) Resp:  [16-17] 17 (03/02 0832) BP: (146-163)/(58-68) 157/68 (03/02 0832) SpO2:  [97 %] 97 % (03/02 0832)  GENERAL: Awake, alert,  in no acute distress Psych: Affect appropriate for situation, patient is calm and cooperative with examination Head: Normocephalic and atraumatic, without obvious abnormality EENT: Normal conjunctivae, dry mucous membranes, no OP obstruction LUNGS: Normal respiratory effort. Non-labored breathing on room air CV: Regular rate and rhythm on telemetry ABDOMEN: Soft, non-tender, non-distended Extremities: Warm, well perfused, without obvious deformity  NEURO:  Mental Status: Awake, alert, and oriented to person, place, and time.  Patient is unable to provide full details regarding her presentation as she does not recall falling or events leading up to hospitalization.  Speech/Language: speech is intact without dysarthria.   Naming, repetition, fluency, and comprehension intact without aphasia No neglect is noted Cranial Nerves:  II: PERRL. Visual fields full.  III, IV, VI: EOMI withotu ptosis, diplopia, or nystagmus.  V: Sensation is intact to light touch and symmetrical to face.  VII: Face is symmetric resting and smiling.  VIII: Hearing is intact to voice IX, X: Palate elevation is symmetric. Phonation normal.  XI: Normal sternocleidomastoid and trapezius muscle strength XII: Tongue protrudes midline without fasciculations.   Motor: 5/5 strength is all muscle groups without vertical drift and without asymmetry.  Tone is normal. Bulk is normal.  Sensation: Intact to light touch bilaterally in all four extremities. No extinction to DSS present.  Coordination: FTN intact bilaterally. HKS intact bilaterally. No pronator drift. DTRs: 2+ and symmetric throughout.  Gait: Deferred  NIHSS: 0  Labs I have reviewed labs in epic and the results pertinent to this consultation are: CBC    Component Value Date/Time   WBC 12.2 (H) 10/10/2021 0552   RBC 4.49 10/10/2021 0552   HGB 12.7 10/10/2021 0552   HCT 39.7 10/10/2021 0552   PLT 254 10/10/2021 0552   MCV 88.4 10/10/2021 0552   MCH 28.3  10/10/2021 0552   MCHC 32.0 10/10/2021 0552   RDW 17.0 (H) 10/10/2021 0552   LYMPHSABS 3.0 10/09/2021 1915   MONOABS 0.4 10/09/2021 1915   EOSABS 0.0 10/09/2021 1915   BASOSABS 0.0 10/09/2021 1915   CMP     Component Value Date/Time   NA 140 10/10/2021 0552   NA 144 11/09/2020 1101   K 3.3 (L) 10/10/2021 0552   CL 104 10/10/2021 0552   CO2 28 10/10/2021 0552   GLUCOSE 78 10/10/2021 0552   BUN 14 10/10/2021 0552   BUN 25 11/09/2020 1101   CREATININE 0.83 10/10/2021 0552   CREATININE 0.93 05/18/2021 1623   CALCIUM 9.0 10/10/2021 0552   PROT 7.2 10/09/2021 1915   PROT 6.6 11/09/2020 1101   ALBUMIN 3.7 10/09/2021 1915   ALBUMIN 4.2 11/09/2020 1101   AST 16 10/09/2021 1915   ALT 11 10/09/2021 1915   ALKPHOS 55  10/09/2021 1915   BILITOT 1.0 10/09/2021 1915   BILITOT 0.8 11/09/2020 1101   GFRNONAA >60 10/10/2021 0552   GFRAA >60 03/22/2020 2148   Lipid Panel     Component Value Date/Time   CHOL 275 (H) 11/09/2020 1101   TRIG 115 11/09/2020 1101   HDL 67 11/09/2020 1101   CHOLHDL 4.1 11/09/2020 1101   CHOLHDL 4.0 12/01/2019 0344   VLDL 17 12/01/2019 0344   LDLCALC 188 (H) 11/09/2020 1101   Lab Results  Component Value Date   HGBA1C 6.8 (H) 08/16/2021   Imaging I have reviewed the images obtained:  MRI examination of the brain 3/1: Acute infarct right inferior cerebellum. Small areas of acute infarction left medial thalamus Atrophy and moderate chronic microvascular ischemic change.  CT angio head and neck 2/28: 1. Negative for intracranial large vessel occlusion or significant stenosis 2. No significant carotid stenosis in the neck 3. Both vertebral arteries are diffusely diseased with moderate stenosis bilaterally. 4. CT head demonstrates no acute abnormality. Atrophy and moderate chronic microvascular ischemic change in the white matter.  Echocardiogram 3/1: 1. Left ventricular ejection fraction, by estimation, is 70 to 75%. The left ventricle has  hyperdynamic function. The left ventricle has no regional wall motion abnormalities. Left ventricular diastolic parameters are consistent with Grade I diastolic dysfunction (impaired relaxation).   2. Right ventricular systolic function is normal. The right ventricular size is normal. There is normal pulmonary artery systolic pressure.   3. The mitral valve is normal in structure. Trivial mitral valve regurgitation.   4. The aortic valve is normal in structure. There is mild calcification of the aortic valve. There is mild thickening of the aortic valve. Aortic valve regurgitation is not visualized.   Assessment: 88 y.o. female with recent history of dizziness and vertigo with concerns for BPPV who presented to the ED after an unwitnessed fall. 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  - Examination is without focal neurologic deficit with an NIHSS of 0. - Imaging as above with acute right inferior cerebellar infarction with vessel imaging negative for LVO or significant stenosis.  - Will complete stroke work up on patient, though I suspect that her months of dizziness is due to a peripheral cause such as BPPV, her acute stroke could be the cause of her fall prior to presentation. - Stroke risk factors include patient's advanced age, family history of stroke (father), DM2, and HTN.   Recommendations: - HgbA1c, fasting lipid panel - Frequent neuro checks - Prophylactic therapy- Antiplatelet med: Aspirin - dose 325mg  PO or 300mg  PR followed by ASA 81 mg PO daily - Risk factor modification - Telemetry monitoring - PT consult, OT consult, Speech consult - orthostatic vitals x 1. - Stroke team to follow  Pt seen by NP/Neuro and later by MD. Note/plan to be edited by MD as needed.  Anibal Henderson, AGAC-NP [facility] Pager: [phone]  NEUROHOSPITALIST ADDENDUM Performed a face to face diagnostic evaluation.   I have reviewed the contents  of history and physical exam as documented by PA/ARNP/Resident and agree with above documentation.  I have discussed and formulated the above plan as documented. Edits to the note have been made as needed.  Impression/Key exam findings/Plan: she has a R cerebellar stroke that appears patchy and possibly embolic. Could explain her current episode of dizziness but she has poor recollection of the actual event and was essentially found down. However, this would not explain recurrent episodes of  dizziness that she has been having. In addition to stroke workup, will also attempt to get orthostatic vitals. The chronic episodes are believed to be ?BPPV per notes. They are certainly peripheral.  Donnetta Simpers, MD [facility] [id number]   If 7pm to 7am, please call on call as listed on AMION.

## 2021-10-11 NOTE — Evaluation (Signed)
Physical Therapy Evaluation ?Patient Details ?Name: Leslie Ward ?MRN: [id number] ?DOB: [birth year] ?Today's Date: 10/11/2021 ? ?History of Present Illness ? 88 y.o. female with recent history of dizziness and vertigo with concerns for BPPV who presented to the ED after an unwitnessed fall. 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  PMH positive for dystonia, HTN, hypothyroidism.  ?Clinical Impression ? Patient presents with mobility likely close to her baseline.  She lives in ground floor apartment with her son who works and was completing BADL's  on her own, walking with walker.  She does not drive and reports tremor in her head longstanding.  Reports dizziness over past week, but is now improved.  Testing for positional vertigo was negative and she was able to ambulate with walker with supervision.  She was, however, in a very wet bed unaware and seemed to have difficulty with receptive language at times (is not native [place] speaker, but has previous hospitalization without any communication issues.)  Feel she will benefit from continues skilled PT in the acute setting and from follow up [place] at d/c.  May benefit from speech cognitive-language evaluation.  ?   ? ?Recommendations for follow up therapy are one component of a multi-disciplinary discharge planning process, led by the attending physician.  Recommendations may be updated based on patient status, additional functional criteria and insurance authorization. ? ?Follow Up Recommendations Home health PT ? ?  ?Assistance Recommended at Discharge Frequent or constant Supervision/Assistance  ?Patient can return home with the following ? A little help with walking and/or transfers;Assistance with cooking/housework;Assist for transportation;A little help with bathing/dressing/bathroom ? ?  ?Equipment Recommendations None recommended by PT  ?Recommendations for Other Services ?    ?  ?Functional Status Assessment Patient has  had a recent decline in their functional status and demonstrates the ability to make significant improvements in function in a reasonable and predictable amount of time.  ? ?  ?Precautions / Restrictions Precautions ?Precautions: Fall  ? ?  ? ?Mobility ? Bed Mobility ?Overal bed mobility: Modified Independent ?  ?  ?  ?  ?  ?  ?General bed mobility comments: increased time, needed assist for lifting trunk during testing for BPPV ?  ? ?Transfers ?Overall transfer level: Needs assistance ?Equipment used: Rolling walker (2 wheels) ?Transfers: Sit to/from Stand, Bed to chair/wheelchair/BSC ?Sit to Stand: Supervision ?  ?Step pivot transfers: Min guard ?  ?  ?  ?General transfer comment: for safety with RW to stand, assist for step to recliner without walker ?  ? ?Ambulation/Gait ?Ambulation/Gait assistance: Supervision ?Gait Distance (Feet): 150 Feet ?Assistive device: Rolling walker (2 wheels) ?Gait Pattern/deviations: Step-through pattern, Decreased stride length ?  ?  ?  ?General Gait Details: stable with RW, no c/o dizziness throughout ? ?Stairs ?  ?  ?  ?  ?  ? ?Wheelchair Mobility ?  ? ?Modified Rankin (Stroke Patients Only) ?  ? ?  ? ?Balance Overall balance assessment: Needs assistance ?  ?Sitting balance-Leahy Scale: Good ?  ?  ?  ?Standing balance-Leahy Scale: Fair ?Standing balance comment: able to stand without UE support, but needs walker for ambulation ?  ?  ?  ?  ?  ?  ?  ?  ?  ?  ?  ?   ? ? ? ?Pertinent Vitals/Pain Pain Assessment ?Pain Assessment: No/denies pain  ? ? ?Home Living Family/patient expects to be discharged to:: Private residence ?Living Arrangements: Children ?Available Help at Discharge: Available  PRN/intermittently ?Type of Home: Apartment ?Home Access: Stairs to enter;Level entry ?  ?  ?  ?Home Layout: One level ?Home Equipment: Grab bars - toilet;Grab bars - tub/shower ?   ?  ?Prior Function Prior Level of Function : Independent/Modified Independent;History of Falls (last six months) ?   ?  ?  ?  ?  ?  ?Mobility Comments: walks with walker and completes ADL's. not driving, 2 falls in 6 months ?  ?  ? ? ?Hand Dominance  ? Dominant Hand: Right ? ?  ?Extremity/Trunk Assessment  ? Upper Extremity Assessment ?Upper Extremity Assessment: Overall WFL for tasks assessed ?  ? ?Lower Extremity Assessment ?Lower Extremity Assessment: Overall WFL for tasks assessed ?  ? ?Cervical / Trunk Assessment ?Cervical / Trunk Assessment: Kyphotic  ?Communication  ? Communication: Receptive difficulties (not a native [place] speaker, communicates verbally well, but sometimes doesn't seem to understand the questions)  ?Cognition Arousal/Alertness: Awake/alert ?Behavior During Therapy: [facility] for tasks assessed/performed ?Overall Cognitive Status: No family/caregiver present to determine baseline cognitive functioning ?Area of Impairment: Orientation, Attention, Problem solving ?  ?  ?  ?  ?  ?  ?  ?  ?Orientation Level: Disoriented to, Place (knew hospital, but unable to state name of hospital) ?Current Attention Level: Selective ?  ?  ?  ?  ?Problem Solving: Slow processing ?General Comments: laying in completely soaked bed and not aware, native Spanish speaker, but evidently in the past no communication issues (from previous visit), but today some issues with receptive language needing increased gestures, rephrasing to understand questions at times ?  ?  ? ?  ?General Comments General comments (skin integrity, edema, etc.): completed testing for positional vertigo with sidelying modified hallpike and supine head roll both negative R and L for nystagmus or dizziness ? ?  ?Exercises    ? ?Assessment/Plan  ?  ?PT Assessment Patient needs continued PT services  ?PT Problem List Decreased mobility;Decreased balance;Decreased cognition;Decreased safety awareness ? ?   ?  ?PT Treatment Interventions Gait training;Therapeutic activities;DME instruction;Functional mobility training;Balance training;Patient/family education   ? ?PT  Goals (Current goals can be found in the Care Plan section)  ?Acute Rehab PT Goals ?Patient Stated Goal: to return to independent ?PT Goal Formulation: With patient ?Time For Goal Achievement: 10/25/21 ?Potential to Achieve Goals: Good ? ?  ?Frequency Min 3X/week ?  ? ? ?Co-evaluation   ?  ?  ?  ?  ? ? ?  ?AM-PAC PT "6 Clicks" Mobility  ?Outcome Measure Help needed turning from your back to your side while in a flat bed without using bedrails?: None ?Help needed moving from lying on your back to sitting on the side of a flat bed without using bedrails?: A Little ?Help needed moving to and from a bed to a chair (including a wheelchair)?: A Little ?Help needed standing up from a chair using your arms (e.g., wheelchair or bedside chair)?: A Little ?Help needed to walk in hospital room?: None ?Help needed climbing 3-5 steps with a railing? : Total ?6 Click Score: 18 ? ?  ?End of Session Equipment Utilized During Treatment: Gait belt ?Activity Tolerance: Patient tolerated treatment well ?Patient left: in chair;with call bell/phone within reach;with chair alarm set ?  ?PT Visit Diagnosis: Other abnormalities of gait and mobility (R26.89);History of falling (Z91.81) ?  ? ?Time: [id number] ?PT Time Calculation (min) (ACUTE ONLY): 31 min ? ? ?Charges:   PT Evaluation ?$PT Eval Moderate Complexity: 1 Mod ?PT Treatments ?$Gait Training:  8-22 mins ?  ?   ? ? ?Magda Kiel, PT ?Acute Rehabilitation Services ?[id number] ?Office:[phone] ?10/11/2021 ? ? ?Reginia Naas ?10/11/2021, 2:16 PM ? ?

## 2021-10-11 NOTE — Progress Notes (Signed)
Mobility Specialist Progress Note: ? ? 10/11/21 1432  ?Mobility  ?Bed Position Chair  ?Activity Ambulated with assistance in hallway  ?Level of Assistance Standby assist, set-up cues, supervision of patient - no hands on  ?Assistive Device Front wheel walker  ?Distance Ambulated (ft) 100 ft  ?Activity Response Tolerated well  ?$Mobility charge 1 Mobility  ? ?Orthostatic BPs ?Supine 146/52  ?Sitting 147/65  ?Standing 148/60  ?   ?   ?  ?Pt received in chair willing to participate in mobility. No complaints of pain. Pt left in chair with call bell in reach and all needs met.  ? ?Leslie Ward ?Mobility Specialist ?Primary Phone [phone] ? ?

## 2021-10-11 NOTE — Telephone Encounter (Signed)
Pt's son, Anabelen Winebarger (on [place]) pt need sooner appt. Has been admitted to [facility] for a fall due to dizziness. They did MRI last night and finding are not positive. Mr. Tak asking if Dr. Krista Blue can review the MRI. Would like a call from the nurse to discuss sooner the April. ?

## 2021-10-11 NOTE — Progress Notes (Signed)
?Progress Note ? ?Patient: Leslie Ward [id number] DOB: [birth year]  ?DOA: 10/09/2021  DOS: 10/11/2021  ?  ?Brief hospital course: ?Leslie Ward is an 88 y.o. female with a history of CVA April 2021, diet-controlled T2DM, hypothyroidism, HTN, glaucoma who presented to the ED at [facility] after being found down in the kitchen after an unwitnessed fall with uncertain etiology. The patient has amnesia for how she ended up on the floor, but has feeling very dizzy for several weeks with intermittent headache, having been evaluated by ENT and ophthalmology as an outpatient with some suspicion for BPPV. She was afebrile with NSR on ECG, labs largely unremarkable. CT head in the ED revealed no acute abnormalities. CTA head and neck demonstrated moderate bilateral vertebral artery atherosclerosis, for which neurology was consulted, not suspecting that this is a causative or significant finding. Subsequent MR brain revealed acute infarct in the inferior cerebellum and left medial thalamus. Stroke work up is underway, therapy consults pending. ? ?Assessment and Plan: ?Acute right inferior cerebellum infarct: Also left medial thalamus infarct. Distribution suggestive of embolic source, no known AFib. No CES on echo.  ?- Neurology consulted, recommending full dose aspirin then 81mg  daily ?- PT/OT/SLP pending.  ?- Defer further work up to neurology, specifically Re: cardiac monitoring/loop recorder/TEE.  ?- LDL is 127, though HDL is 72! Has intolerance to rosuvastatin. Will discuss with patient and family further. ? ?Fall possibly due to stroke: Concern for syncope due to amnesia for the event.  ?- orthostatic VS's ?- Fall precautions ? ?Dizziness: This is not explained by the acute stroke. Appears peripheral by exam.  ?- Orthostatic VS's ?- Vestibular evaluation requested.  ?  ?Headaches: Concern for glaucoma per outpatient providers.  ?- Continue gtt's, f/u with ophthalmology.  ?  ?Inflammatory arthritis: Followed by Dr.  Benjamine Mola.  ?- Continue chronic prednisone.  ?  ?Hypothyroidism: Very mildly elevated TSH, of unclear significance in acute setting.  ?- Will recommend repeat testing at follow up.  ?  ?Hypokalemia: Mild ?- Supplemented ?  ?Leukocytosis: Chronic, intermittent, in setting of chronic prednisone use, currently without fever. No pyuria, CXR infiltrate, cutaneous infectious signs or symptoms, or meningismus.  ?- Monitor off abx for now. ?  ?T2DM with neuropathy: Well-controlled with HbA1c 6%. ?- Continue carb modified diet, low dose gabapentin ?  ?HTN:  ?- Continue metoprolol, ARB ? ?Subjective: Having some eye irritation, dizzy when getting up to chair today. No new deficits.  ? ?Objective: ?Vitals:  ? 10/10/21 0714 10/10/21 1551 10/10/21 2300 10/11/21 0832  ?BP: (!) 149/59 (!) 146/58 (!) 163/65 (!) 157/68  ?Pulse: 69 72 73 72  ?Resp: 16 16 16 17   ?Temp: 97.7 ?F (36.5 ?C) 98.2 ?F (36.8 ?C) (!) 97.5 ?F (36.4 ?C) 98 ?F (36.7 ?C)  ?TempSrc: Oral Oral Oral Oral  ?SpO2: 96% 97% 97% 97%  ?Weight:      ?Height:      ? ?Gen: Elderly frail female in no distress ?Pulm: Nonlabored breathing room air.  ?CV: Regular rate and rhythm. No murmur, rub, or gallop. No JVD, no dependent edema. ?GI: Abdomen soft, non-tender, non-distended, with normoactive bowel sounds.  ?Ext: Warm, no deformities ?Skin: No rashes, lesions or ulcers on visualized skin. ?Neuro: Alert and oriented. Mild short term memory deficit suspected. +nystagmus bilaterally without new focal neurological deficits. ?Psych: Judgement and insight appear fair. Mood euthymic & affect congruent. Behavior is appropriate. ?   ?Telemetry (personal review): NSR no ectopy. ? ?Data Personally reviewed: ?WBC 12.2 ?Hgb 12.7 ?BMP largely unremarkable,  K 3.3.  ?LFTs wnl ?HbA1c 6% ?HDL 72, LDL 127.  ?TSH 5.541.  ?Urinalysis unremarkable ?   ?CT ANGIO HEAD NECK W WO CM 10/09/2021 ? ?IMPRESSION: 1. Negative for intracranial large vessel occlusion or significant stenosis 2. No significant carotid  stenosis in the neck 3. Both vertebral arteries are diffusely diseased with moderate stenosis bilaterally. 4. CT head demonstrates no acute abnormality. Atrophy and moderate chronic microvascular ischemic change in the white matter.  ? ?MR BRAIN WO CONTRAST 10/10/2021 ?IMPRESSION: Acute infarct right inferior cerebellum. Small areas of acute infarction left medial thalamus Atrophy and moderate chronic microvascular ischemic change.  ? ?ECHOCARDIOGRAM 10/10/2021 ?IMPRESSIONS   ?1. Left ventricular ejection fraction, by estimation, is 70 to 75%. The left ventricle has hyperdynamic function. The left ventricle has no regional wall motion abnormalities. Left ventricular diastolic parameters are consistent with Grade I diastolic dysfunction (impaired relaxation).   ?2. Right ventricular systolic function is normal. The right ventricular size is normal. There is normal pulmonary artery systolic pressure.   ?3. The mitral valve is normal in structure. Trivial mitral valve regurgitation.   ?4. The aortic valve is normal in structure. There is mild calcification of the aortic valve. There is mild thickening of the aortic valve. Aortic valve regurgitation is not visualized.  ?IAS/Shunts: The atrial septum is grossly normal.  ? ?SARS-CoV-2: Negative ?Influenza A/B: Negative ? ?Family Communication: Called son without answer, will try again later. ? ?Disposition: ?Status is: Inpatient ?Remains inpatient appropriate because: Stroke work up, therapy evaluations ?Planned Discharge Destination:  TBD ? ?Patrecia Pour, MD ?10/11/2021 1:57 PM ?Page by [email or web address]  ?

## 2021-10-12 ENCOUNTER — Encounter: Payer: Self-pay | Admitting: Neurology

## 2021-10-12 DIAGNOSIS — R55 Syncope and collapse: Secondary | ICD-10-CM | POA: Diagnosis not present

## 2021-10-12 DIAGNOSIS — E039 Hypothyroidism, unspecified: Secondary | ICD-10-CM | POA: Diagnosis not present

## 2021-10-12 DIAGNOSIS — I10 Essential (primary) hypertension: Secondary | ICD-10-CM | POA: Diagnosis not present

## 2021-10-12 DIAGNOSIS — I639 Cerebral infarction, unspecified: Secondary | ICD-10-CM | POA: Diagnosis not present

## 2021-10-12 DIAGNOSIS — R42 Dizziness and giddiness: Secondary | ICD-10-CM | POA: Diagnosis not present

## 2021-10-12 DIAGNOSIS — E119 Type 2 diabetes mellitus without complications: Secondary | ICD-10-CM | POA: Diagnosis not present

## 2021-10-12 MED ORDER — CLOPIDOGREL BISULFATE 75 MG PO TABS
75.0000 mg | ORAL_TABLET | Freq: Every day | ORAL | 0 refills | Status: AC
Start: 2021-10-13 — End: 2021-11-03

## 2021-10-12 MED ORDER — CLOPIDOGREL BISULFATE 75 MG PO TABS
75.0000 mg | ORAL_TABLET | Freq: Every day | ORAL | Status: DC
Start: 2021-10-12 — End: 2021-10-13
  Administered 2021-10-12: 75 mg via ORAL
  Filled 2021-10-12: qty 1

## 2021-10-12 MED ORDER — EZETIMIBE 10 MG PO TABS
10.0000 mg | ORAL_TABLET | Freq: Every day | ORAL | Status: DC
  Administered 2021-10-12: 10 mg via ORAL
  Filled 2021-10-12: qty 1

## 2021-10-12 MED ORDER — ASPIRIN 81 MG PO TBEC: 81.0000 mg | DELAYED_RELEASE_TABLET | Freq: Every day | ORAL | 1 refills | Status: DC

## 2021-10-12 MED ORDER — EZETIMIBE 10 MG PO TABS: 10.0000 mg | ORAL_TABLET | Freq: Every day | ORAL | 1 refills | Status: DC

## 2021-10-12 NOTE — Progress Notes (Addendum)
STROKE TEAM PROGRESS NOTE  ? ?INTERVAL HISTORY ?Patient is seen in her room with no family at the bedside.  On 2/28, she fell at home and was unable to get up after experiencing a headache.  She has been complaining of daily episodes of headaches and dizziness and was being evaluated for possible BPPV.  MRI performed 3/1 demonstrates right inferior cerebellar infarct.   ? ?Vitals:  ? 10/11/21 2150 10/12/21 0424 10/12/21 [id number] 10/12/21 [id number]  ?BP: (!) 137/53 (!) 158/60 (!) 170/65 (!) 170/65  ?Pulse: 63 66  68  ?Resp: 18 18  18   ?Temp: 97.9 ?F (36.6 ?C) 98 ?F (36.7 ?C)  97.8 ?F (36.6 ?C)  ?TempSrc: Oral Oral  Oral  ?SpO2: 97% 96%  96%  ?Weight:      ?Height:      ? ?CBC:  ?Recent Labs  ?Lab 10/09/21 ?1915 10/10/21 ?0552  ?WBC 9.1 12.2*  ?NEUTROABS 5.6  --   ?HGB 14.2 12.7  ?HCT 43.5 39.7  ?MCV 88.8 88.4  ?PLT 247 254  ? ?Basic Metabolic Panel:  ?Recent Labs  ?Lab 10/09/21 ?1829 10/10/21 ?0552  ?NA 135 140  ?K 4.1 3.3*  ?CL 102 104  ?CO2 26 28  ?GLUCOSE 113* 78  ?BUN 22 14  ?CREATININE 0.89 0.83  ?CALCIUM 9.3 9.0  ? ?Lipid Panel:  ?Recent Labs  ?Lab 10/11/21 ?1107  ?CHOL 225*  ?TRIG 129  ?HDL 72  ?CHOLHDL 3.1  ?VLDL 26  ?LDLCALC 127*  ? ?HgbA1c:  ?Recent Labs  ?Lab 10/11/21 ?1107  ?HGBA1C 6.0*  ? ?Urine Drug Screen:  ?Recent Labs  ?Lab 10/09/21 ?2042  ?LABOPIA NONE DETECTED  ?COCAINSCRNUR NONE DETECTED  ?LABBENZ NONE DETECTED  ?AMPHETMU NONE DETECTED  ?THCU NONE DETECTED  ?LABBARB NONE DETECTED  ?  ?Alcohol Level  ?Recent Labs  ?Lab 10/09/21 ?1915  ?ETH <10  ? ? ?IMAGING past 24 hours ?No results found. ? ?PHYSICAL EXAM ?General:  Alert, well-developed, well-nourished patient in no acute distress. ?Respiratory:  Regular, unlabored respirations on room air ? ? ?NEURO:  ?Mental Status: AA&Ox3  ?Speech/Language: speech is without dysarthria or aphasia.  Naming, fluency, and comprehension intact. ? ?Cranial Nerves:  ?II: PERRL. Visual fields full.  ?III, IV, VI: EOMI. Eyelids elevate symmetrically.  ?VII: Smile is  symmetrical.   ?VIII: hearing intact to voice. ?IX, X:  Phonation is normal.  ?XII: tongue is midline without fasciculations. ?Motor: 5/5 strength to all muscle groups tested.  ?Tone: is normal and bulk is normal ?Sensation- Intact to light touch bilaterally.   ?Coordination: FTN intact bilaterally.No drift.  ?Gait- deferred ? ? ?ASSESSMENT/PLAN ?[name] is a 88 y.o. female with history of HTN, stroke, T2DM and glaucoma presenting after a fall at home on 2/28 She was unable to get up and experienced a headache prior to the fall.  She has been complaining of daily episodes of headaches and dizziness and was being evaluated for possible BPPV.  MRI performed 3/1 demonstrates right inferior cerebellar infarct and left medial thalamic infarct.   ? ?Stroke:  right cerebellar infarct  likely secondary to embolic source  ?CTA head & neck negative for intracranial LVO or significant stenosis, moderate bilateral vertebral artery stenosis ?MRI  acute infarcts in right inferior cerebellum and left medial thalamus, atrophy and chronic microvascular changes ?2D Echo EF 70-75%, hyperdynamic left ventricle, grade 1 diastolic dysfunction, normal atrial septum ?LDL 127 ?HgbA1c 6.0 ?VTE prophylaxis - lovenox ?   ?Diet  ? Diet heart healthy/carb modified Room service  appropriate? Yes; Fluid consistency: Thin  ? ?No antithrombotic prior to admission, now on aspirin 81 mg daily and clopidogrel 75 mg daily.  ?Therapy recommendations:  home health. ?Disposition:  home. ? ?Hypertension ?Home meds:  none ?Stable ?Permissive hypertension (OK if < 220/120) but gradually normalize in 5-7 days ?Long-term BP goal normotensive ? ?Hyperlipidemia ?Home meds:  none ?LDL 127, goal < 70 ?Add zetia 10 mg  ?High intensity statin not indicated due to previous adverse reaction ? ?Diabetes type II Controlled ?Home meds:  none ?HgbA1c 6.0, goal < 7.0 ?CBGs ?Recent Labs  ?  10/09/21 ?1820  ?GLUCAP 105*  ?  ?SSI ? ?Other Stroke Risk  Factors ?Advanced Age >/= 92  ? ?Other Active Problems ?none ? ?Hospital day # 0 ? ?Leslie Ward , MSN, AGACNP-BC ?[facility] ?See Amion for schedule and pager information ?10/12/2021 2:14 PM ?  ?ATTENDING ATTESTATION: ? ?This 88 year old lady who had a fall she has also been dealing with intermittent dizziness and headaches.  She has a neurologist outpatient Dr. Ronny Ward who is helping to manage this.  She is also on prednisone 5 mg per her rheumatologist for arthritis pain.  MRI shows small right cerebellar stroke and left medial thalamus.  Her exam today is completely normal.  She had her echo completed was unremarkable.  Will recommend DAPT therapy for 21 days then aspirin 81mg  alone.  Since her stroke is likely thromboembolic in the posterior circulation.  I see no need for further cardiac monitoring.  This was relayed to the primary attending Dr. Bonner Ward.   ? ?I was able to speak with patient's son Leslie Ward? over the phone and updated him on her condition.  He had many questions for me about her vertigo as well as headaches.  All his questions were answered.  I discussed that she may have a separate peripheral vertigo that is separate from her CVA.  Provided reassurance and told him they should follow-up with her neurologist outpatient for further instructions. ? ?Dr. Reeves Ward evaluated pt independently, reviewed imaging, chart, labs. Discussed and formulated plan with the APP. Please see APP note above for details.   Total 36 minutes spent on counseling patient and coordinating care, writing notes and reviewing chart. ? ?Leslie Juste,MD  ? ?To contact Stroke Continuity provider, please refer to [email or web address] ?After hours, contact General Neurology  ?

## 2021-10-12 NOTE — Discharge Summary (Addendum)
Physician Discharge Summary   Patient: Leslie Ward MRN: [id number] DOB: 27-Jan-1931  Admit date:     10/09/2021  Discharge date: 10/12/21  Discharge Physician: Leslie Ward   PCP: Leslie Pai, PA-C   Recommendations at discharge:  Follow up with home health/vestibular therapy. Follow up with neurology, Dr. Krista Blue, post stroke hospitalization started on DAPT x3 weeks, then aspirin alone and started zetia due to statin intolerance. Consider lipid clinic referral. Suggest recheck TSH, CBC at follow up.  Discharge Diagnoses: Principal Problem:   Syncope Active Problems:   Hypertension   Type 2 diabetes mellitus (Leslie Ward)   Hypothyroidism (acquired)   Vertigo  Hospital Course: Leslie Ward is an 88 y.o. female with a history of CVA April 2021, diet-controlled T2DM, hypothyroidism, HTN, glaucoma who presented to the ED at [facility] after being found down in the kitchen after an unwitnessed fall with uncertain etiology. The patient has amnesia for how she ended up on the floor, but has feeling very dizzy for several weeks with intermittent headache, having been evaluated by ENT and ophthalmology as an outpatient with some suspicion for BPPV. She was afebrile with NSR on ECG, labs largely unremarkable. CT head in the ED revealed no acute abnormalities. CTA head and neck demonstrated moderate bilateral vertebral artery atherosclerosis, for which neurology was consulted, not suspecting that this is a causative or significant finding. Subsequent MR brain revealed acute infarct in the inferior cerebellum and left medial thalamus. After stroke work up, presumptive etiology is thromboembolic disease from PCA  Assessment and Plan: Acute right inferior cerebellum infarct: Also left medial thalamus infarct. Distribution suggestive of posterior circulation thromboembolic source, no known AFib. No CES on echo.  - Neurology consulted: DAPT x3 weeks, then aspirin alone. - PT/OT/SLP to continue at home with  DME as ordered.  - LDL is 127, though HDL is 72! Has intolerance to rosuvastatin. After discussion, will initiate zetia.   Fall possibly due to stroke: Concern for syncope due to amnesia for the event.  - orthostatic VS's negative - Fall precautions   Dizziness: This is not explained by the acute stroke. Appears peripheral by exam.  - Orthostatic VS's negative - Vestibular evaluations ongoing, suspect BPPV   Headaches: Concern for glaucoma per outpatient providers.  - Continue gtt's, f/u with ophthalmology.    Inflammatory arthritis: Followed by Dr. Benjamine Mola.  - Continue chronic prednisone.    Hypothyroidism: Very mildly elevated TSH, of unclear significance in acute setting.  - Will recommend repeat testing at follow up.    Hypokalemia: Mild - Supplemented   Leukocytosis: Chronic, intermittent, in setting of chronic prednisone use, currently without fever. No pyuria, CXR infiltrate, cutaneous infectious signs or symptoms, or meningismus.  - Monitor off abx for now.   T2DM with neuropathy: Well-controlled with HbA1c 6%. - Continue carb modified diet, low dose gabapentin   HTN:  - Continue metoprolol, ARB  Consultants: Neurology Procedures performed: None  Disposition: Home Diet recommendation:  Discharge Diet Orders (From admission, onward)     Start     Ordered   10/12/21 0000  Diet - low sodium heart healthy        10/12/21 1604           Cardiac and Carb modified diet  DISCHARGE MEDICATION: Allergies as of 10/12/2021       Reactions   Crestor [rosuvastatin Calcium] Other (See Comments)   "caused me to go into kidney failure"        Medication List  TAKE these medications    amLODipine 5 MG tablet Commonly known as: NORVASC TAKE 1 TABLET(5 MG) BY MOUTH TWICE DAILY What changed:  how much to take how to take this when to take this additional instructions   aspirin 81 MG EC tablet Take 1 tablet (81 mg total) by mouth daily. Swallow whole. Start  taking on: October 13, 2021   blood glucose meter kit and supplies Dispense based on patient and insurance preference. Use up to four times daily as directed. (FOR ICD-10 E10.9, E11.9).   CALCIUM 600 PO Take 1 tablet by mouth daily.   CHOLEST CARE PO Take 1,600 mg by mouth daily.   Cinnamon 500 MG capsule Take 1,000 mg by mouth daily.   clopidogrel 75 MG tablet Commonly known as: PLAVIX Take 1 tablet (75 mg total) by mouth daily for 21 days. Start taking on: October 13, 2021   ezetimibe 10 MG tablet Commonly known as: ZETIA Take 1 tablet (10 mg total) by mouth daily.   fluticasone 50 MCG/ACT nasal spray Commonly known as: FLONASE Place 2 sprays into both nostrils daily. What changed:  when to take this reasons to take this   gabapentin 100 MG capsule Commonly known as: NEURONTIN Take 1 capsule (100 mg total) by mouth at bedtime.   incobotulinumtoxinA 100 units Solr injection Commonly known as: XEOMIN Inject 100 Units into the muscle once.   latanoprost 0.005 % ophthalmic solution Commonly known as: XALATAN Place 1 drop into both eyes every evening.   levothyroxine 50 MCG tablet Commonly known as: SYNTHROID TAKE ONE TABLET BY MOUTH DAILY BEFORE BREAKFAST What changed: See the new instructions.   meclizine 12.5 MG tablet Commonly known as: ANTIVERT Take 1 tablet (12.5 mg total) by mouth 3 (three) times daily as needed for dizziness. What changed: Another medication with the same name was removed. Continue taking this medication, and follow the directions you see here.   melatonin 5 MG Tabs Take 5 mg by mouth at bedtime as needed (sleep).   metoprolol tartrate 50 MG tablet Commonly known as: LOPRESSOR TAKE ONE TABLET BY MOUTH TWICE A DAY   OMEGA 3 PO Take 2 tablets by mouth daily.   OneTouch Delica Plus [id number] Misc USE TO CHECK BLOOD SUGAR UP TO FOUR TIMES DAILY AS DIRECTED   OneTouch Ultra test strip Generic drug: glucose blood   predniSONE 5 MG  tablet Commonly known as: DELTASONE Take 1 tablet (5 mg total) by mouth daily with breakfast.   PROBIOTIC DAILY PO Take 1 tablet by mouth at bedtime.   telmisartan 20 MG tablet Commonly known as: MICARDIS Take 1 tablet (20 mg total) by mouth daily. What changed: when to take this   vitamin C 500 MG tablet Commonly known as: ASCORBIC ACID Take 500 mg by mouth daily.   Vitamin D 125 MCG (5000 UT) Caps Take 5,000 Units by mouth daily.   VITAMIN K2 PO Take 1 tablet by mouth at bedtime.        Follow-up Information     [place] [place] Follow up.   Specialty: [place] Why: Merchandiser, retail information: [place] [address] [phone]         [place], Percell Miller, PA-C Follow up.   Specialties: Internal Medicine, Family Medicine Contact information: [address] [place] [address] [phone]         Marcial Pacas, MD. Schedule an appointment as soon as possible for a  visit.   Specialty: Neurology Contact information: [place] [address] [phone]                Subjective: Feels well, no new events. Wants me to read the Bible. Wants to get back to her dog, Snoopy.   Discharge Exam: BP (!) 170/65 (BP Location: Right Arm)    Pulse 68    Temp 97.8 F (36.6 C) (Oral)    Resp 18    Ht _0  (1.448 m)    Wt 53.1 kg    SpO2 96%    BMI 25.32 kg/m   Well-appearing elderly female in no distress RRR No focal neurological deficits  Condition at discharge: stable  The results of significant diagnostics from this hospitalization (including imaging, microbiology, ancillary and laboratory) are listed below for reference.   Imaging Studies: CT ANGIO HEAD NECK W WO CM  Result Date: 10/09/2021 CLINICAL DATA:  Subarachnoid hemorrhage. Dizziness and headache. Fall. EXAM: CT ANGIOGRAPHY HEAD AND NECK TECHNIQUE: Multidetector CT imaging of the head and neck was  performed using the standard protocol during bolus administration of intravenous contrast. Multiplanar CT image reconstructions and MIPs were obtained to evaluate the vascular anatomy. Carotid stenosis measurements (when applicable) are obtained utilizing NASCET criteria, using the distal internal carotid diameter as the denominator. RADIATION DOSE REDUCTION: This exam was performed according to the departmental dose-optimization program which includes automated exposure control, adjustment of the mA and/or kV according to patient size and/or use of iterative reconstruction technique. CONTRAST:  36m OMNIPAQUE IOHEXOL 350 MG/ML SOLN COMPARISON:  CT head 10/02/2021 FINDINGS: CT HEAD FINDINGS Brain: Moderate atrophy. Moderate chronic microvascular ischemic change in the white matter. Negative for acute infarct, acute hemorrhage, or mass lesion. Vascular: Negative for hyperdense vessel Skull: Negative Sinuses: Mild mucosal edema paranasal sinuses. Orbits: No orbital lesion.  Bilateral cataract extraction Review of the MIP images confirms the above findings CTA NECK FINDINGS Aortic arch: Mild atherosclerotic disease aortic arch. Aberrant right subclavian artery. Proximal great vessels patent without stenosis. Right carotid system: Right carotid widely patent. Negative for atherosclerotic disease or dissection. Left carotid system: Left carotid bifurcation widely patent. Eccentric calcific plaque in the internal carotid artery below the skull base without significant stenosis. Negative for dissection. Vertebral arteries: Proximal right vertebral artery patent. Scattered atherosclerotic disease throughout the right cervical carotid with diffuse narrowing. Multiple areas of moderate stenosis in the right vertebral artery. Proximal left vertebral artery is patent. Moderate to severe stenosis left vertebral artery at the C6 level. Mild stenosis distal left vertebral artery at the C1 level. Skeleton: Multilevel disc and facet  degeneration throughout the cervical spine. No acute skeletal abnormality. Other neck: Negative for mass or edema in the neck. Upper chest: Lung apices clear bilaterally. Review of the MIP images confirms the above findings CTA HEAD FINDINGS Anterior circulation: Internal carotid artery widely patent through the skull base and cavernous segment. Mild atherosclerotic disease in the cavernous carotid bilaterally. Anterior and middle cerebral arteries patent bilaterally without stenosis or large vessel occlusion. Negative for aneurysm. Posterior circulation: Both vertebral arteries patent to the basilar. PICA patent bilaterally. Basilar patent. Superior cerebellar and posterior cerebral arteries patent bilaterally without stenosis or large vessel occlusion. Venous sinuses: Normal venous enhancement. Anatomic variants: None Review of the MIP images confirms the above findings IMPRESSION: 1. Negative for intracranial large vessel occlusion or significant stenosis 2. No significant carotid stenosis in the neck 3. Both vertebral arteries are diffusely diseased with moderate stenosis bilaterally.  4. CT head demonstrates no acute abnormality. Atrophy and moderate chronic microvascular ischemic change in the white matter. Electronically Signed   By: Franchot Gallo M.D.   On: 10/09/2021 20:06   CT HEAD WO CONTRAST (5MM)  Result Date: 10/02/2021 CLINICAL DATA:  Headache, increasing with pressure sensation EXAM: CT HEAD WITHOUT CONTRAST TECHNIQUE: Contiguous axial images were obtained from the base of the skull through the vertex without intravenous contrast. RADIATION DOSE REDUCTION: This exam was performed according to the departmental dose-optimization program which includes automated exposure control, adjustment of the mA and/or kV according to patient size and/or use of iterative reconstruction technique. COMPARISON:  CT head 05/04/2021 FINDINGS: Brain: There is no evidence of acute intracranial hemorrhage, extra-axial  fluid collection, or acute infarct. Background parenchymal volume is within normal limits for age. The ventricles are stable in size. There is confluent hypodensity throughout the subcortical and periventricular white matter likely reflecting sequela of advanced chronic white matter microangiopathy. There are remote lacunar infarcts in the bilateral basal ganglia and thalami, unchanged. There is no mass lesion.  There is no midline shift. Vascular: There is calcification of the bilateral cavernous ICAs. Skull: Normal. Negative for fracture or focal lesion. Sinuses/Orbits: There is mucosal thickening in the left sphenoid sinus. Bilateral lens implants are in place. The globes and orbits are otherwise unremarkable. Other: None. IMPRESSION: Stable appearance of the brain since 05/04/2021 with no evidence of acute intracranial pathology. Electronically Signed   By: Valetta Mole M.D.   On: 10/02/2021 16:35   MR BRAIN WO CONTRAST  Result Date: 10/10/2021 CLINICAL DATA:  Dizziness EXAM: MRI HEAD WITHOUT CONTRAST TECHNIQUE: Multiplanar, multiecho pulse sequences of the brain and surrounding structures were obtained without intravenous contrast. COMPARISON:  CT head 10/02/2021 FINDINGS: Brain: Cluster of small areas of acute infarction in the right inferior cerebellum. Additional small infarcts in the left medial thalamus. Generalized atrophy. Moderate chronic microvascular ischemic change throughout the white matter. Chronic ischemia in the thalamus and pons bilaterally. Negative for hemorrhage or mass. Vascular: Negative for hyperdense sclerotic have normal arterial flow voids in the skull base. Skull and upper cervical spine: No focal skeletal abnormality. Sinuses/Orbits: Mild mucosal edema paranasal sinuses. Bilateral cataract extraction. Small left mastoid effusion Other: None IMPRESSION: Acute infarct right inferior cerebellum. Small areas of acute infarction left medial thalamus Atrophy and moderate chronic  microvascular ischemic change. Electronically Signed   By: Franchot Gallo M.D.   On: 10/10/2021 18:53   DG Chest Portable 1 View  Result Date: 10/09/2021 CLINICAL DATA:  Syncope. EXAM: PORTABLE CHEST 1 VIEW COMPARISON:  Chest radiograph dated 05/31/2021. FINDINGS: No focal consolidation, pleural effusion, pneumothorax. Stable cardiac silhouette. Atherosclerotic calcification of the aorta. No acute osseous pathology. IMPRESSION: No active cardiopulmonary disease. Electronically Signed   By: Anner Crete M.D.   On: 10/09/2021 19:56   ECHOCARDIOGRAM COMPLETE  Result Date: 10/10/2021    ECHOCARDIOGRAM REPORT   Patient Name:   JI FAIRBURN Date of Exam: 10/10/2021 Medical Rec #:  [id number]        Height:       57.0 in Accession #:    [id number]       Weight:       117.0 lb Date of Birth:  [birth year]        BSA:          1.432 m Patient Age:    88 years         BP:  149/59 mmHg Patient Gender: F                HR:           70 bpm. Exam Location:  Inpatient Procedure: 2D Echo Indications:    syncope  History:        Patient has prior history of Echocardiogram examinations, most                 recent 12/01/2019. Risk Factors:Diabetes, Hypertension and                 Dyslipidemia.  Sonographer:    Johny Chess RDCS Referring Phys: [place]  1. Left ventricular ejection fraction, by estimation, is 70 to 75%. The left ventricle has hyperdynamic function. The left ventricle has no regional wall motion abnormalities. Left ventricular diastolic parameters are consistent with Grade I diastolic dysfunction (impaired relaxation).  2. Right ventricular systolic function is normal. The right ventricular size is normal. There is normal pulmonary artery systolic pressure.  3. The mitral valve is normal in structure. Trivial mitral valve regurgitation.  4. The aortic valve is normal in structure. There is mild calcification of the aortic valve. There is mild thickening of the aortic  valve. Aortic valve regurgitation is not visualized. FINDINGS  Left Ventricle: Left ventricular ejection fraction, by estimation, is 70 to 75%. The left ventricle has hyperdynamic function. The left ventricle has no regional wall motion abnormalities. The left ventricular internal cavity size was small. There is no  left ventricular hypertrophy. Left ventricular diastolic parameters are consistent with Grade I diastolic dysfunction (impaired relaxation). Right Ventricle: The right ventricular size is normal. Right vetricular wall thickness was not well visualized. Right ventricular systolic function is normal. There is normal pulmonary artery systolic pressure. The tricuspid regurgitant velocity is 2.59 m/s, and with an assumed right atrial pressure of 3 mmHg, the estimated right ventricular systolic pressure is 49.6 mmHg. Left Atrium: Left atrial size was normal in size. Right Atrium: Right atrial size was normal in size. Pericardium: There is no evidence of pericardial effusion. Mitral Valve: The mitral valve is normal in structure. Trivial mitral valve regurgitation. Tricuspid Valve: The tricuspid valve is normal in structure. Tricuspid valve regurgitation is trivial. Aortic Valve: The aortic valve is normal in structure. There is mild calcification of the aortic valve. There is mild thickening of the aortic valve. Aortic valve regurgitation is not visualized. Pulmonic Valve: The pulmonic valve was not well visualized. Pulmonic valve regurgitation is not visualized. Aorta: The aortic root and ascending aorta are structurally normal, with no evidence of dilitation. IAS/Shunts: The atrial septum is grossly normal.  LEFT VENTRICLE PLAX 2D LVIDd:         4.30 cm   Diastology LVIDs:         2.30 cm   LV e' medial:    8.55 cm/s LV PW:         0.90 cm   LV E/e' medial:  9.1 LV IVS:        0.90 cm   LV e' lateral:   6.98 cm/s LVOT diam:     1.50 cm   LV E/e' lateral: 11.2 LV SV:         37 LV SV Index:   26 LVOT Area:      1.77 cm  RIGHT VENTRICLE             IVC RV S prime:     15.50 cm/s  IVC diam: 1.30 cm TAPSE (M-mode): 1.5 cm LEFT ATRIUM             Index        RIGHT ATRIUM           Index LA diam:        3.30 cm 2.30 cm/m   RA Area:     11.70 cm LA Vol (A2C):   32.6 ml 22.76 ml/m  RA Volume:   26.60 ml  18.57 ml/m LA Vol (A4C):   44.9 ml 31.35 ml/m LA Biplane Vol: 41.7 ml 29.12 ml/m  AORTIC VALVE LVOT Vmax:   96.90 cm/s LVOT Vmean:  65.000 cm/s LVOT VTI:    0.209 m  AORTA Ao Root diam: 2.40 cm Ao Asc diam:  3.10 cm MITRAL VALVE                TRICUSPID VALVE MV Area (PHT): 3.12 cm     TR Peak grad:   26.8 mmHg MV Decel Time: 243 msec     TR Vmax:        259.00 cm/s MV E velocity: 78.10 cm/s MV A velocity: 110.00 cm/s  SHUNTS MV E/A ratio:  0.71         Systemic VTI:  0.21 m                             Systemic Diam: 1.50 cm Mertie Moores MD Electronically signed by Mertie Moores MD Signature Date/Time: 10/10/2021/12:49:01 PM    Final     Microbiology: Results for orders placed or performed during the hospital encounter of 10/09/21  Resp Panel by RT-PCR (Flu A&B, Covid) Nasopharyngeal Swab     Status: None   Collection Time: 10/09/21  7:26 PM   Specimen: Nasopharyngeal Swab; Nasopharyngeal(NP) swabs in vial transport medium  Result Value Ref Range Status   SARS Coronavirus 2 by RT PCR NEGATIVE NEGATIVE Final    Comment: (NOTE) SARS-CoV-2 target nucleic acids are NOT DETECTED.  The SARS-CoV-2 RNA is generally detectable in upper respiratory specimens during the acute phase of infection. The lowest concentration of SARS-CoV-2 viral copies this assay can detect is 138 copies/mL. A negative result does not preclude SARS-Cov-2 infection and should not be used as the sole basis for treatment or other patient management decisions. A negative result may occur with  improper specimen collection/handling, submission of specimen other than nasopharyngeal swab, presence of viral mutation(s) within the areas  targeted by this assay, and inadequate number of viral copies(<138 copies/mL). A negative result must be combined with clinical observations, patient history, and epidemiological information. The expected result is Negative.  Fact Sheet for Patients:  EntrepreneurPulse.com.au  Fact Sheet for Healthcare Providers:  IncredibleEmployment.be  This test is no t yet approved or cleared by the [place] FDA and  has been authorized for detection and/or diagnosis of SARS-CoV-2 by FDA under an Emergency Use Authorization (EUA). This EUA will remain  in effect (meaning this test can be used) for the duration of the COVID-19 declaration under Section 564(b)(1) of the Act, 21 U.S.C.section 360bbb-3(b)(1), unless the authorization is terminated  or revoked sooner.       Influenza A by PCR NEGATIVE NEGATIVE Final   Influenza B by PCR NEGATIVE NEGATIVE Final    Comment: (NOTE) The Xpert Xpress SARS-CoV-2/FLU/RSV plus assay is intended as an aid in the diagnosis of influenza from Nasopharyngeal swab specimens and should not be used as  a sole basis for treatment. Nasal washings and aspirates are unacceptable for Xpert Xpress SARS-CoV-2/FLU/RSV testing.  Fact Sheet for Patients: EntrepreneurPulse.com.au  Fact Sheet for Healthcare Providers: IncredibleEmployment.be  This test is not yet approved or cleared by the [place] FDA and has been authorized for detection and/or diagnosis of SARS-CoV-2 by FDA under an Emergency Use Authorization (EUA). This EUA will remain in effect (meaning this test can be used) for the duration of the COVID-19 declaration under Section 564(b)(1) of the Act, 21 U.S.C. section 360bbb-3(b)(1), unless the authorization is terminated or revoked.  Performed at [facility], [address] [place], [place] [address]     Labs: CBC: Recent Labs  Lab 10/09/21 1829  10/09/21 1915 10/10/21 0552  WBC 8.9 9.1 12.2*  NEUTROABS  --  5.6  --   HGB 13.9 14.2 12.7  HCT 42.5 43.5 39.7  MCV 88.0 88.8 88.4  PLT 293 247 446   Basic Metabolic Panel: Recent Labs  Lab 10/09/21 1829 10/10/21 0552  NA 135 140  K 4.1 3.3*  CL 102 104  CO2 26 28  GLUCOSE 113* 78  BUN 22 14  CREATININE 0.89 0.83  CALCIUM 9.3 9.0   Liver Function Tests: Recent Labs  Lab 10/09/21 1915  AST 16  ALT 11  ALKPHOS 55  BILITOT 1.0  PROT 7.2  ALBUMIN 3.7   CBG: Recent Labs  Lab 10/09/21 1820  GLUCAP 105*    Discharge time spent: greater than 30 minutes.  Signed: Patrecia Pour, MD [facility] 10/12/2021

## 2021-10-12 NOTE — Progress Notes (Signed)
Mobility Specialist Progress Note: ? ? 10/12/21 1610  ?Mobility  ?Activity Ambulated with assistance in hallway  ?Level of Assistance Standby assist, set-up cues, supervision of patient - no hands on  ?Assistive Device Front wheel walker  ?Distance Ambulated (ft) 150 ft  ?Activity Response Tolerated well  ?$Mobility charge 1 Mobility  ? ?Pt received in chair willing to participate in mobility. No complaints of pain. Pt left in recliner with PT present for session.  ? ?Leslie Ward ?Mobility Specialist ?Primary Phone [phone] ? ?

## 2021-10-12 NOTE — Progress Notes (Signed)
Nsg Discharge Note ? ?Admit Date:  10/09/2021 ?Discharge date: 10/12/2021 ?  ?Marsa Aris to be D/C'd Home per MD order.  AVS completed.  Copy for chart, and copy for patient signed, and dated. ?Removed IV-CDI. Reviewed d/c paperwork with son. Answered all questions. Wheeled stable patient and belongings to main entrance where she was picked up by her son. ?Patient/caregiver able to verbalize understanding. ? ?Discharge Medication: ?Allergies as of 10/12/2021   ? ?   Reactions  ? Crestor [rosuvastatin Calcium] Other (See Comments)  ? "caused me to go into kidney failure"  ? ?  ? ?  ?Medication List  ?  ? ?TAKE these medications   ? ?amLODipine 5 MG tablet ?Commonly known as: NORVASC ?TAKE 1 TABLET(5 MG) BY MOUTH TWICE DAILY ?What changed:  ?how much to take ?how to take this ?when to take this ?additional instructions ?  ?aspirin 81 MG EC tablet ?Take 1 tablet (81 mg total) by mouth daily. Swallow whole. ?Start taking on: October 13, 2021 ?  ?blood glucose meter kit and supplies ?Dispense based on patient and insurance preference. Use up to four times daily as directed. (FOR ICD-10 E10.9, E11.9). ?  ?CALCIUM 600 PO ?Take 1 tablet by mouth daily. ?  ?CHOLEST CARE PO ?Take 1,600 mg by mouth daily. ?  ?Cinnamon 500 MG capsule ?Take 1,000 mg by mouth daily. ?  ?clopidogrel 75 MG tablet ?Commonly known as: PLAVIX ?Take 1 tablet (75 mg total) by mouth daily for 21 days. ?Start taking on: October 13, 2021 ?  ?ezetimibe 10 MG tablet ?Commonly known as: ZETIA ?Take 1 tablet (10 mg total) by mouth daily. ?  ?fluticasone 50 MCG/ACT nasal spray ?Commonly known as: FLONASE ?Place 2 sprays into both nostrils daily. ?What changed:  ?when to take this ?reasons to take this ?  ?gabapentin 100 MG capsule ?Commonly known as: NEURONTIN ?Take 1 capsule (100 mg total) by mouth at bedtime. ?  ?incobotulinumtoxinA 100 units Solr injection ?Commonly known as: XEOMIN ?Inject 100 Units into the muscle once. ?  ?latanoprost 0.005 % ophthalmic  solution ?Commonly known as: XALATAN ?Place 1 drop into both eyes every evening. ?  ?levothyroxine 50 MCG tablet ?Commonly known as: SYNTHROID ?TAKE ONE TABLET BY MOUTH DAILY BEFORE BREAKFAST ?What changed: See the new instructions. ?  ?meclizine 12.5 MG tablet ?Commonly known as: ANTIVERT ?Take 1 tablet (12.5 mg total) by mouth 3 (three) times daily as needed for dizziness. ?What changed: Another medication with the same name was removed. Continue taking this medication, and follow the directions you see here. ?  ?melatonin 5 MG Tabs ?Take 5 mg by mouth at bedtime as needed (sleep). ?  ?metoprolol tartrate 50 MG tablet ?Commonly known as: LOPRESSOR ?TAKE ONE TABLET BY MOUTH TWICE A DAY ?  ?OMEGA 3 PO ?Take 2 tablets by mouth daily. ?  ?OneTouch Delica Plus [id number] Misc ?USE TO CHECK BLOOD SUGAR UP TO FOUR TIMES DAILY AS DIRECTED ?  ?OneTouch Ultra test strip ?Generic drug: glucose blood ?  ?predniSONE 5 MG tablet ?Commonly known as: DELTASONE ?Take 1 tablet (5 mg total) by mouth daily with breakfast. ?  ?PROBIOTIC DAILY PO ?Take 1 tablet by mouth at bedtime. ?  ?telmisartan 20 MG tablet ?Commonly known as: MICARDIS ?Take 1 tablet (20 mg total) by mouth daily. ?What changed: when to take this ?  ?vitamin C 500 MG tablet ?Commonly known as: ASCORBIC ACID ?Take 500 mg by mouth daily. ?  ?Vitamin D 125 MCG (5000 UT) Caps ?  Take 5,000 Units by mouth daily. ?  ?VITAMIN K2 PO ?Take 1 tablet by mouth at bedtime. ?  ? ?  ? ? ?Discharge Assessment: ?Vitals:  ? 10/12/21 0852 10/12/21 1619  ?BP: (!) 170/65 (!) 145/54  ?Pulse: 68 74  ?Resp: 18 19  ?Temp: 97.8 ?F (36.6 ?C) 98.3 ?F (36.8 ?C)  ?SpO2: 96% 95%  ? Skin clean, dry and intact without evidence of skin break down, no evidence of skin tears noted. ?IV catheter discontinued intact. Site without signs and symptoms of complications - no redness or edema noted at insertion site, patient denies c/o pain - only slight tenderness at site.  Dressing with slight pressure  applied. ? ?D/c Instructions-Education: ?Discharge instructions given to patient/family with verbalized understanding. ?D/c education completed with patient/family including follow up instructions, medication list, d/c activities limitations if indicated, with other d/c instructions as indicated by MD - patient able to verbalize understanding, all questions fully answered. ?Patient instructed to return to ED, call 911, or call MD for any changes in condition.  ?Patient escorted via [place], and D/C home via private auto. ? ?Santa Lighter, RN ?10/12/2021 7:54 PM  ?

## 2021-10-12 NOTE — Progress Notes (Addendum)
Patient ID: Leslie Ward, female   DOB: 1931-04-17, 88 y.o.   MRN: [id number] ?Attempted to call pt's son but no answer. Left a message to call back. Will try again later.  ? ?Addendum: I was able to speak to the son and update him on his mother's condition. ?

## 2021-10-12 NOTE — Evaluation (Signed)
Speech Language Pathology Evaluation ?Patient Details ?Name: Leslie Ward ?MRN: [id number] ?DOB: 1931-08-02 ?Today's Date: 10/12/2021 ?Time: 1230-1300 ?SLP Time Calculation (min) (ACUTE ONLY): 30 min ? ?Problem List:  ?Patient Active Problem List  ? Diagnosis Date Noted  ? Syncope 10/09/2021  ? Sedimentation rate elevation 08/24/2021  ? Bilateral shoulder pain 08/24/2021  ? Inflammatory arthritis 08/24/2021  ? Urinary frequency 08/24/2021  ? COVID 05/29/2021  ? Post-COVID chronic fatigue 05/29/2021  ? Post-COVID chronic joint pain 05/29/2021  ? Post-COVID chronic muscle pain 05/29/2021  ? Thyroid disease   ? Ganglion cyst 08/25/2020  ? Mixed incontinence 03/15/2020  ? Urgency incontinence 03/15/2020  ? Vertigo 12/01/2019  ? Cervical dystonia 03/08/2019  ? Hyperlipidemia 02/11/2019  ? Edema 02/11/2019  ? Hypertension 01/19/2019  ? Type 2 diabetes mellitus ([place]) 01/19/2019  ? Hypothyroidism (acquired) 01/19/2019  ? Neck pain 01/19/2019  ? Dystonia 01/19/2019  ? Adhesive capsulitis of left shoulder 12/22/2018  ? ?Past Medical History:  ?Past Medical History:  ?Diagnosis Date  ? Dystonia   ? Glaucoma   ? Hypertension   ? Thyroid disease   ? ?Past Surgical History:  ?Past Surgical History:  ?Procedure Laterality Date  ? NO PAST SURGERIES    ? ?HPI:  ?88 y.o. female with recent history of dizziness and vertigo with concerns for BPPV who presented to the ED after an unwitnessed fall. 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   - Examination is without focal neurologic deficit. Pt passed [place], SLP ordered 3/2.  ? ?Assessment / Plan / Recommendation ?Clinical Impression ? Pt demonstrates cognitive impairment consistent with baseline level of function per son and DIL (at bedside). Pt struggles with working and short term memory making her repetitive in conversation. She has been dependent on husband or son for complex ADL's all her life. Advised DIL to consider increased supervision with  medication as they do organize her meds but do not observe her taking them. No further SLP interventions recommended as pt is at her baseline. ?   ?SLP Assessment ? SLP Recommendation/Assessment: Patient does not need any further [facility]  ?  ?Recommendations for follow up therapy are one component of a multi-disciplinary discharge planning process, led by the attending physician.  Recommendations may be updated based on patient status, additional functional criteria and insurance authorization. ?   ?Follow Up Recommendations ? No SLP follow up  ?  ?Assistance Recommended at Discharge ?    ?Functional Status Assessment    ?Frequency and Duration    ?  ?  ?   ?SLP Evaluation ?Cognition ? Overall Cognitive Status: History of cognitive impairments - at baseline ?Arousal/Alertness: Awake/alert ?Orientation Level: Oriented to person;Oriented to place;Disoriented to time ?Attention: Selective ?Selective Attention: Appears intact ?Memory: Impaired ?Memory Impairment: Decreased short term memory ?Decreased Short Term Memory: Verbal basic;Functional basic ?Awareness: Impaired ?Awareness Impairment: Intellectual impairment;Emergent impairment ?Problem Solving: Impaired ?Problem Solving Impairment: Verbal complex;Functional complex ?Safety/Judgment: Appears intact  ?  ?   ?Comprehension ? Auditory Comprehension ?Overall Auditory Comprehension: Appears within functional limits for tasks assessed  ?  ?Expression Verbal Expression ?Overall Verbal Expression: Appears within functional limits for tasks assessed ?Written Expression ?Dominant Hand: Right   ?Oral / Motor ? Oral Motor/Sensory Function ?Overall Oral Motor/Sensory Function: Within functional limits ?Motor Speech ?Overall Motor Speech: Appears within functional limits for tasks assessed   ?        ? ?Tylyn Stankovich, Katherene Ponto ?10/12/2021, 2:08 PM ? ?

## 2021-10-12 NOTE — Evaluation (Signed)
Occupational Therapy Evaluation ?Patient Details ?Name: Leslie Ward ?MRN: [id number] ?DOB: [birth year] ?Today's Date: 10/12/2021 ? ? ?History of Present Illness 88 y.o. female with recent history of dizziness and vertigo with concerns for BPPV who presented to the ED after an unwitnessed fall. 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  PMH positive for dystonia, HTN, hypothyroidism.  ? ?Clinical Impression ?  ?Patient was Independent in ADLs and has son that would completed IADLs for patient at baseline.  Patient is back to baseline at this time and no further OT intervention needed at this time ?   ? ?Recommendations for follow up therapy are one component of a multi-disciplinary discharge planning process, led by the attending physician.  Recommendations may be updated based on patient status, additional functional criteria and insurance authorization.  ? ?Follow Up Recommendations ? No OT follow up  ?  ?Assistance Recommended at Discharge Intermittent Supervision/Assistance  ?Patient can return home with the following Assist for transportation;Assistance with cooking/housework;Direct supervision/assist for financial management;Direct supervision/assist for medications management ? ?  ?Functional Status Assessment ? Patient has had a recent decline in their functional status and demonstrates the ability to make significant improvements in function in a reasonable and predictable amount of time.  ?Equipment Recommendations ? None recommended by OT  ?  ?Recommendations for Other Services   ? ? ?  ?Precautions / Restrictions Precautions ?Precautions: Fall  ? ?  ? ?Mobility Bed Mobility ?Overal bed mobility: Modified Independent ?  ?  ?  ?  ?  ?  ?General bed mobility comments: HOB elevated ?  ? ?Transfers ?Overall transfer level: Modified independent ?Equipment used: Rolling walker (2 wheels) ?Transfers: Sit to/from Stand ?Sit to Stand: Modified independent (Device/Increase  time) ?  ?  ?  ?  ?  ?  ?  ? ?  ?Balance Overall balance assessment: Modified Independent ?  ?Sitting balance-Leahy Scale: Good ?  ?  ?  ?  ?  ?  ?  ?  ?  ?  ?  ?  ?  ?  ?  ?  ?   ? ?ADL either performed or assessed with clinical judgement  ? ?ADL Overall ADL's : Modified independent ?  ?  ?  ?  ?  ?  ?  ?  ?  ?  ?  ?  ?  ?  ?  ?  ?  ?  ?  ?   ? ? ? ?Vision Baseline Vision/History: 1 Wears glasses;3 Glaucoma ?Patient Visual Report: No change from baseline ?   ?   ?Perception   ?  ?Praxis   ?  ? ?Pertinent Vitals/Pain Pain Assessment ?Pain Assessment: 0-10 ?Pain Score: 0-No pain  ? ? ? ?Hand Dominance Right ?  ?Extremity/Trunk Assessment Upper Extremity Assessment ?Upper Extremity Assessment: Overall WFL for tasks assessed ?  ?Lower Extremity Assessment ?Lower Extremity Assessment: Defer to PT evaluation ?  ?Cervical / Trunk Assessment ?Cervical / Trunk Assessment: Kyphotic ?  ?Communication Communication ?Communication: Receptive difficulties ?  ?Cognition Arousal/Alertness: Awake/alert ?Behavior During Therapy: [facility] for tasks assessed/performed ?Overall Cognitive Status: Within Functional Limits for tasks assessed ?  ?  ?  ?  ?  ?  ?  ?  ?  ?  ?  ?  ?  ?  ?  ?  ?  ?  ?  ?General Comments    ? ?  ?Exercises   ?  ?Shoulder Instructions    ? ? ?Home  Living Family/patient expects to be discharged to:: Private residence ?Living Arrangements: Alone ?Available Help at Discharge: Family;Available PRN/intermittently ?Type of Home: Apartment ?Home Access: Level entry ?  ?  ?Home Layout: One level ?  ?  ?Bathroom Shower/Tub: Walk-in shower ?  ?Bathroom Toilet: Standard ?  ?  ?Home Equipment: Grab bars - toilet;Grab bars - tub/shower;Rolling Walker (2 wheels) ?  ?  ?  ? ?  ?Prior Functioning/Environment Prior Level of Function : Independent/Modified Independent;History of Falls (last six months) ?  ?  ?  ?  ?  ?  ?Mobility Comments: walks with walker and completes ADL's. not driving, 2 falls in 6 months ?  ?  ? ?  ?  ?OT  Problem List:   ?  ?   ?OT Treatment/Interventions:    ?  ?OT Goals(Current goals can be found in the care plan section)    ?OT Frequency:   ?  ? ?Co-evaluation   ?  ?  ?  ?  ? ?  ?AM-PAC OT "6 Clicks" Daily Activity     ?Outcome Measure Help from another person eating meals?: None ?Help from another person taking care of personal grooming?: None ?Help from another person toileting, which includes using toliet, bedpan, or urinal?: None ?Help from another person bathing (including washing, rinsing, drying)?: None ?Help from another person to put on and taking off regular upper body clothing?: None ?Help from another person to put on and taking off regular lower body clothing?: None ?6 Click Score: 24 ?  ?End of Session Equipment Utilized During Treatment: Rolling walker (2 wheels) ? ?Activity Tolerance: Patient tolerated treatment well ?Patient left: in chair;with chair alarm set;with call bell/phone within reach ? ?OT Visit Diagnosis: Other abnormalities of gait and mobility (R26.89)  ?              ?Time: 1610-9604 ?OT Time Calculation (min): 25 min ?Charges:  OT General Charges ?$OT Visit: 1 Visit ?OT Evaluation ?$OT Eval Low Complexity: 1 Low ?OT Treatments ?$Self Care/Home Management : 8-22 mins ? ? ? ?Denice Paradise OTR/L ?10/12/2021, 11:17 AM ?

## 2021-10-12 NOTE — Progress Notes (Signed)
Physical Therapy Treatment ?Patient Details ?Name: Leslie Ward ?MRN: [id number] ?DOB: April 04, 1931 ?Today's Date: 10/12/2021 ? ? ?History of Present Illness 88 y.o. female with recent history of dizziness and vertigo with concerns for BPPV who presented to the ED after an unwitnessed fall. 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  PMH positive for dystonia, HTN, hypothyroidism. ? ?  ?PT Comments  ? ? Patient seen just following walk with mobility in hallway so session focus on completing oculomotor and visuo-vestibular exam which indicated L unilateral vestibular hypofunction.  Patient is currently without symptoms of dizziness so continue to feel follow up HHPT indicated for mobility and continued home safety.  Patient educated today with handout on fall prevention as well as on need for healthy habits (good sleep, diet, hydration, etc) to avoid decompensation from adaptation to unilateral hypofunction.  Patient verbalized understanding.  Noted plans for d/c, but will follow up if not d/c.    ?Recommendations for follow up therapy are one component of a multi-disciplinary discharge planning process, led by the attending physician.  Recommendations may be updated based on patient status, additional functional criteria and insurance authorization. ? ?Follow Up Recommendations ? Home health PT ?  ?  ?Assistance Recommended at Discharge Frequent or constant Supervision/Assistance  ?Patient can return home with the following A little help with walking and/or transfers;Assistance with cooking/housework;Assist for transportation;A little help with bathing/dressing/bathroom ?  ?Equipment Recommendations ? None recommended by PT  ?  ?Recommendations for Other Services   ? ? ?  ?Precautions / Restrictions Precautions ?Precautions: Fall  ?  ? ?Mobility ? Bed Mobility ?  ?  ?  ?  ?  ?  ?  ?  ?  ? ?Transfers ?  ?  ?  ?  ?  ?  ?  ?  ?  ?  ?  ? ?Ambulation/Gait ?  ?  ?  ?  ?  ?  ?  ?General  Gait Details: just back from walking with mobility specialist ? ? ?Stairs ?  ?  ?  ?  ?  ? ? ?Wheelchair Mobility ?  ? ?Modified [place] (Stroke Patients Only) ?  ? ? ?  ?Balance   ?  ?  ?  ?  ?  ?  ?  ?  ?  ?  ?  ?  ?  ?  ?  ?  ?  ?  ?  ? ?  ?Cognition Arousal/Alertness: Awake/alert ?Behavior During Therapy: [facility] for tasks assessed/performed ?Overall Cognitive Status: History of cognitive impairments - at baseline ?  ?  ?  ?  ?  ?  ?  ?  ?  ?  ?  ?  ?  ?  ?  ?  ?  ?  ?  ? ?  ?Exercises   ? ?  ?General Comments General comments (skin integrity, edema, etc.): completed oculomotor assessment and pt with smooth pursuits and saccades WNL, able to complete VOR without symptoms of dizziness x 10 head turns/nods seated edge of recliner.  VOR cancellation WNL along with UE coordination testing.  Patient with positive refixation with head thrust test to L; positive for hearing issue bilateral unable to hear with scratch test.  Denies current dizziness and educated on fall prevention with handout given and no need for vestibular follow up at this time as pt reports no dizziness.   Did educate on unilateral vestibular hypofunction and need for plenty of rest, good diet, hydration  and staying healthy to avoid decompensation. ?  ?  ? ?Pertinent Vitals/Pain Pain Assessment ?Pain Assessment: No/denies pain  ? ? ?Home Living   ?  ?  ?  ?  ?  ?  ?  ?  ?  ?   ?  ?Prior Function    ?  ?  ?   ? ?PT Goals (current goals can now be found in the care plan section) Progress towards PT goals: Progressing toward goals ? ?  ?Frequency ? ? ? Min 3X/week ? ? ? ?  ?PT Plan Current plan remains appropriate  ? ? ?Co-evaluation   ?  ?  ?  ?  ? ?  ?AM-PAC PT "6 Clicks" Mobility   ?Outcome Measure ? Help needed turning from your back to your side while in a flat bed without using bedrails?: None ?Help needed moving from lying on your back to sitting on the side of a flat bed without using bedrails?: A Little ?Help needed moving to and from a bed to a  chair (including a wheelchair)?: A Little ?Help needed standing up from a chair using your arms (e.g., wheelchair or bedside chair)?: A Little ?Help needed to walk in hospital room?: None ?Help needed climbing 3-5 steps with a railing? : Total ?6 Click Score: 18 ? ?  ?End of Session   ?Activity Tolerance: Patient tolerated treatment well ?Patient left: in chair;with call bell/phone within reach ?  ?PT Visit Diagnosis: Other abnormalities of gait and mobility (R26.89);History of falling (Z91.81) ?  ? ? ?Time: [id number] ?PT Time Calculation (min) (ACUTE ONLY): 24 min ? ?Charges:  $Neuromuscular Re-education: 8-22 mins ?$Self Care/Home Management: 8-22          ?          ? ?Leslie Ward, PT ?Acute Rehabilitation Services ?[id number] ?Office:[phone] ?10/12/2021 ? ? ? ?Leslie Ward ?10/12/2021, 5:17 PM ? ?

## 2021-10-12 NOTE — TOC Initial Note (Addendum)
Transition of Care (TOC) - Initial/Assessment Note  ? ? ?Patient Details  ?Name: Leslie Ward ?MRN: [id number] ?Date of Birth: 04-14-1931 ? ?Transition of Care (TOC) CM/SW Contact:    ?Marilu Favre, RN ?Phone Number: ?10/12/2021, 11:20 AM ? ?Clinical Narrative:                 ?Talked to patient at bedside and son [place] via phone. Patient from home with son. HAs walker, shower bench , and cane.  ? ?Discussed recommendations for home health PT/OT / SP . Both in agreement. Son wants to be contact person for home health agency.  ? ?Levada Dy with [employer] accepted referral for HHPT/OT/SP and given [facility] number.  ? ?Recommendations updated to HHPT, no OT ot SP follow up. Home health orders updated and Levada Dy with [facility] aware  ? ?Expected Discharge Plan: [place] ?Barriers to Discharge: Continued Medical Work up ? ? ?Patient Goals and CMS Choice ?Patient states their goals for this hospitalization and ongoing recovery are:: to return to home ?CMS Medicare.gov Compare Post Acute Care list provided to:: Patient ?Choice offered to / list presented to : Patient, Adult Children ? ?Expected Discharge Plan and Services ?Expected Discharge Plan: [place] ?  ?Discharge Planning Services: CM Consult ?Post Acute Care Choice: Home Health ?Living arrangements for the past 2 months: Apartment ?                ?  ?DME Agency: NA ?  ?  ?  ?HH Arranged: PT, OT, Speech Therapy ?[place] Agency: [facility] ?Date HH Agency Contacted: 10/12/21 ?Time [place]: [id number] spoke with at [place]: Levada Dy ? ?Prior Living Arrangements/Services ?Living arrangements for the past 2 months: Apartment ?Lives with:: Adult Children ?Patient language and need for interpreter reviewed:: Yes ?Do you feel safe going back to the place where you live?: Yes      ?Need for Family Participation in Patient Care: Yes (Comment) ?Care giver support system in place?: Yes  (comment) ?Current home services: DME ?Criminal Activity/Legal Involvement Pertinent to Current Situation/Hospitalization: No - Comment as needed ? ?Activities of Daily Living ?Home Assistive Devices/Equipment: Gilford Rile (specify type) ?ADL Screening (condition at time of admission) ?Patient's cognitive ability adequate to safely complete daily activities?: Yes ?Is the patient deaf or have difficulty hearing?: Yes ?Does the patient have difficulty seeing, even when wearing glasses/contacts?: No ?Does the patient have difficulty concentrating, remembering, or making decisions?: Yes ?Patient able to express need for assistance with ADLs?: Yes ?Does the patient have difficulty dressing or bathing?: No ?Independently performs ADLs?: Yes (appropriate for developmental age) ?Does the patient have difficulty walking or climbing stairs?: Yes ?Weakness of Legs: Both ?Weakness of Arms/Hands: None ? ?Permission Sought/Granted ?  ?Permission granted to share information with : Yes, Verbal Permission Granted ? Share Information with NAME: son Jacqulyn Bath [phone] ? Permission granted to share info w AGENCY: [facility] / [facility] ?   ?   ? ?Emotional Assessment ?Appearance:: Appears stated age ?Attitude/Demeanor/Rapport: Engaged ?  ?Orientation: : Oriented to Self, Oriented to Place, Oriented to  Time ?Alcohol / Substance Use: Not Applicable ?Psych Involvement: No (comment) ? ?Admission diagnosis:  Syncope [R55] ?Vertigo [R42] ?Syncope, unspecified syncope type [R55] ?Patient Active Problem List  ? Diagnosis Date Noted  ? Syncope 10/09/2021  ? Sedimentation rate elevation 08/24/2021  ? Bilateral shoulder pain 08/24/2021  ? Inflammatory arthritis 08/24/2021  ? Urinary frequency 08/24/2021  ?  COVID 05/29/2021  ? Post-COVID chronic fatigue 05/29/2021  ? Post-COVID chronic joint pain 05/29/2021  ? Post-COVID chronic muscle pain 05/29/2021  ? Thyroid disease   ? Ganglion cyst 08/25/2020  ? Mixed incontinence 03/15/2020  ? Urgency  incontinence 03/15/2020  ? Vertigo 12/01/2019  ? Cervical dystonia 03/08/2019  ? Hyperlipidemia 02/11/2019  ? Edema 02/11/2019  ? Hypertension 01/19/2019  ? Type 2 diabetes mellitus ([place]) 01/19/2019  ? Hypothyroidism (acquired) 01/19/2019  ? Neck pain 01/19/2019  ? Dystonia 01/19/2019  ? Adhesive capsulitis of left shoulder 12/22/2018  ? ?PCP:  Mackie Pai, PA-C ?Pharmacy:   ?[id number] [address] ?[address] ?[place] [address] ?Phone: [phone] Fax: [phone] ? ? ? ? ?Social Determinants of Health (SDOH) Interventions ?  ? ?Readmission Risk Interventions ?No flowsheet data found. ? ? ?

## 2021-10-12 NOTE — Plan of Care (Signed)
  Problem: Education: Goal: Knowledge of General Education information will improve Description Including pain rating scale, medication(s)/side effects and non-pharmacologic comfort measures Outcome: Progressing   Problem: Health Behavior/Discharge Planning: Goal: Ability to manage health-related needs will improve Outcome: Progressing   

## 2021-10-13 ENCOUNTER — Telehealth: Payer: Self-pay | Admitting: Medical

## 2021-10-13 NOTE — Telephone Encounter (Signed)
Opened to review 

## 2021-10-16 NOTE — Telephone Encounter (Signed)
I am not sure the best contact information for Dr. Deatra Ina to follow up with her. I'll need to get a contact or else we can send a message about the following: ? ?I spoke with Ms. Amorin's son on 3/1 last week she was in the hospital at that time after dizziness and fall and found to have acute cerebellar stroke and some ischemic changes in posterior circulation area. This is very suspicious as a cause of reported symptoms. This was not known at the time of her most recent visit and discussion about the glaucoma. ? ?If her optometrist is still highly concerned about the glaucoma and her low dose prednisone we would probably have to follow up to discuss any alternative plan.

## 2021-10-18 DIAGNOSIS — G243 Spasmodic torticollis: Secondary | ICD-10-CM | POA: Diagnosis not present

## 2021-10-18 DIAGNOSIS — I63541 Cerebral infarction due to unspecified occlusion or stenosis of right cerebellar artery: Secondary | ICD-10-CM | POA: Diagnosis not present

## 2021-10-18 DIAGNOSIS — E1140 Type 2 diabetes mellitus with diabetic neuropathy, unspecified: Secondary | ICD-10-CM | POA: Diagnosis not present

## 2021-10-18 DIAGNOSIS — R55 Syncope and collapse: Secondary | ICD-10-CM | POA: Diagnosis not present

## 2021-10-18 DIAGNOSIS — U099 Post covid-19 condition, unspecified: Secondary | ICD-10-CM | POA: Diagnosis not present

## 2021-10-18 DIAGNOSIS — I69398 Other sequelae of cerebral infarction: Secondary | ICD-10-CM | POA: Diagnosis not present

## 2021-10-18 DIAGNOSIS — Z9181 History of falling: Secondary | ICD-10-CM | POA: Diagnosis not present

## 2021-10-18 DIAGNOSIS — G9332 Myalgic encephalomyelitis/chronic fatigue syndrome: Secondary | ICD-10-CM | POA: Diagnosis not present

## 2021-10-18 DIAGNOSIS — M25511 Pain in right shoulder: Secondary | ICD-10-CM | POA: Diagnosis not present

## 2021-10-18 DIAGNOSIS — Z7952 Long term (current) use of systemic steroids: Secondary | ICD-10-CM | POA: Diagnosis not present

## 2021-10-18 DIAGNOSIS — M064 Inflammatory polyarthropathy: Secondary | ICD-10-CM | POA: Diagnosis not present

## 2021-10-18 DIAGNOSIS — I10 Essential (primary) hypertension: Secondary | ICD-10-CM | POA: Diagnosis not present

## 2021-10-18 DIAGNOSIS — Z7902 Long term (current) use of antithrombotics/antiplatelets: Secondary | ICD-10-CM | POA: Diagnosis not present

## 2021-10-18 DIAGNOSIS — M7502 Adhesive capsulitis of left shoulder: Secondary | ICD-10-CM | POA: Diagnosis not present

## 2021-10-18 DIAGNOSIS — E039 Hypothyroidism, unspecified: Secondary | ICD-10-CM | POA: Diagnosis not present

## 2021-10-18 NOTE — Telephone Encounter (Signed)
FYI- I spoke with Dr. Isaias Cowman' nurse the actual symptoms of concern they recommended she reach out about were headaches and dizziness. I suspect dizziness might be related to her stroke she will be following up with neurology to clarify this. No specific treatment changes recommended today.

## 2021-10-23 DIAGNOSIS — E1140 Type 2 diabetes mellitus with diabetic neuropathy, unspecified: Secondary | ICD-10-CM | POA: Diagnosis not present

## 2021-10-23 DIAGNOSIS — M7502 Adhesive capsulitis of left shoulder: Secondary | ICD-10-CM | POA: Diagnosis not present

## 2021-10-23 DIAGNOSIS — U099 Post covid-19 condition, unspecified: Secondary | ICD-10-CM | POA: Diagnosis not present

## 2021-10-23 DIAGNOSIS — M064 Inflammatory polyarthropathy: Secondary | ICD-10-CM | POA: Diagnosis not present

## 2021-10-23 DIAGNOSIS — I10 Essential (primary) hypertension: Secondary | ICD-10-CM | POA: Diagnosis not present

## 2021-10-23 DIAGNOSIS — R55 Syncope and collapse: Secondary | ICD-10-CM | POA: Diagnosis not present

## 2021-10-23 DIAGNOSIS — I69398 Other sequelae of cerebral infarction: Secondary | ICD-10-CM | POA: Diagnosis not present

## 2021-10-23 DIAGNOSIS — M25511 Pain in right shoulder: Secondary | ICD-10-CM | POA: Diagnosis not present

## 2021-10-23 DIAGNOSIS — G243 Spasmodic torticollis: Secondary | ICD-10-CM | POA: Diagnosis not present

## 2021-10-23 DIAGNOSIS — G9332 Myalgic encephalomyelitis/chronic fatigue syndrome: Secondary | ICD-10-CM | POA: Diagnosis not present

## 2021-10-24 ENCOUNTER — Institutional Professional Consult (permissible substitution): Payer: Medicare HMO | Admitting: Neurology

## 2021-10-25 ENCOUNTER — Encounter: Payer: Self-pay | Admitting: Neurology

## 2021-10-25 ENCOUNTER — Telehealth: Payer: Self-pay | Admitting: Neurology

## 2021-10-25 ENCOUNTER — Ambulatory Visit (INDEPENDENT_AMBULATORY_CARE_PROVIDER_SITE_OTHER): Payer: Medicare HMO | Admitting: Neurology

## 2021-10-25 ENCOUNTER — Other Ambulatory Visit: Payer: Self-pay

## 2021-10-25 VITALS — BP 132/71 | HR 81 | Ht <= 58 in | Wt 117.5 lb

## 2021-10-25 DIAGNOSIS — N952 Postmenopausal atrophic vaginitis: Secondary | ICD-10-CM | POA: Diagnosis not present

## 2021-10-25 DIAGNOSIS — I69398 Other sequelae of cerebral infarction: Secondary | ICD-10-CM | POA: Diagnosis not present

## 2021-10-25 DIAGNOSIS — G243 Spasmodic torticollis: Secondary | ICD-10-CM

## 2021-10-25 DIAGNOSIS — U099 Post covid-19 condition, unspecified: Secondary | ICD-10-CM | POA: Diagnosis not present

## 2021-10-25 DIAGNOSIS — M7502 Adhesive capsulitis of left shoulder: Secondary | ICD-10-CM | POA: Diagnosis not present

## 2021-10-25 DIAGNOSIS — E1140 Type 2 diabetes mellitus with diabetic neuropathy, unspecified: Secondary | ICD-10-CM | POA: Diagnosis not present

## 2021-10-25 DIAGNOSIS — I10 Essential (primary) hypertension: Secondary | ICD-10-CM | POA: Diagnosis not present

## 2021-10-25 DIAGNOSIS — I639 Cerebral infarction, unspecified: Secondary | ICD-10-CM | POA: Diagnosis not present

## 2021-10-25 DIAGNOSIS — N3946 Mixed incontinence: Secondary | ICD-10-CM | POA: Diagnosis not present

## 2021-10-25 DIAGNOSIS — M064 Inflammatory polyarthropathy: Secondary | ICD-10-CM | POA: Diagnosis not present

## 2021-10-25 DIAGNOSIS — M25511 Pain in right shoulder: Secondary | ICD-10-CM | POA: Diagnosis not present

## 2021-10-25 DIAGNOSIS — R55 Syncope and collapse: Secondary | ICD-10-CM | POA: Diagnosis not present

## 2021-10-25 DIAGNOSIS — G9332 Myalgic encephalomyelitis/chronic fatigue syndrome: Secondary | ICD-10-CM | POA: Diagnosis not present

## 2021-10-25 DIAGNOSIS — R159 Full incontinence of feces: Secondary | ICD-10-CM | POA: Diagnosis not present

## 2021-10-25 DIAGNOSIS — N3281 Overactive bladder: Secondary | ICD-10-CM | POA: Diagnosis not present

## 2021-10-25 HISTORY — DX: Cerebral infarction, unspecified: I63.9

## 2021-10-25 NOTE — Telephone Encounter (Signed)
Pt's son requesting call to schedule Botox appt for pt. ?

## 2021-10-25 NOTE — Progress Notes (Addendum)
Chief Complaint  Patient presents with   New Patient (Initial Visit)    Rm 14. Accompanied by son. NX Mannix Kroeker lv 2021/internal referral for cervical dystonia. C/o occasional foggy headedness, "heaviness" in head. Causes vision issues, hearing becomes as if she is under water. Last a few minutes. Occurs 2-3 times per day. C/o pain across forehead when turning head.      ASSESSMENT AND PLAN  Leslie Ward is a 88 y.o. female   Acute stroke involving right cerebellum, left thalamus,  Bilateral posterior circulation distribution, CT angiogram of the head showed bilateral vertebral artery moderate stenosis  Could not rule out a possibility of embolic event,  [id number] cardiac monitoring  Keep follow-up with his cardiologist  Keep aspirin 81 mg daily, emphasized importance of moderate exercise, increase water intake  Also has vascular risk factor of hypertension, hypothyroidism, Addendum: Ophthalmology evaluation by Dr.Tsamis on November 02, 2021, reviewed bilateral macular motting, [place], no disc edema on the right side, left side inferior and sup thin rim  Addendum: 30 days cardiac monitoring in May 2023 showed no significant abnormality, if by history, patient continued complaints of TIA episode, may consider loop recorder  DIAGNOSTIC DATA (LABS, IMAGING, TESTING) - I reviewed patient records, labs, notes, testing and imaging myself where available. Laboratory evaluations A1c 6.0, lipid panel cholesterol 225 LDL 127, CBC, hemoglobin of 12.7, normal TSH,  MEDICAL HISTORY:  Leslie Ward is a 88 year old female, accompanied by her son, seen in request by her primary care PA Saguier, Percell Miller, for evaluation of stroke, she is accompanied by her son,  I reviewed and summarized the referring note. PMhx History of stroke, Type 2 diabetes Hypertension Hypothyroidism Glaucoma  She had hospital admission on October 09, 2021, left found down in the kitchen following an unwitnessed fall,  patient has no recollection of the event, but she complains of intermittent lightheaded sensation, abnormal sensation in her head,  I personally reviewed MRI of the brain acute infarction at the right inferior cerebellum, left medial thalamus,  Echocardiogram showed normal ejection fraction 70 to 75%, there was no regional wall abnormal movement  CT angiogram of head and neck showed no significant carotid artery stenosis, both vertebral artery are diffusely atherosclerotic change, with moderate stenosis bilaterally, CT angiogram of the brain showed significant intracranial disease  She was not on antiplatelet agent, was put on aspirin 81 mg plus Plavix 75 mg, aspirin 81 mg alone  She has a pending appointment with her cardiologist in May  Because of her recent stroke, and frequent complains of uncomfortable sensation in her head, her son is very frustrated and worried about her condition, desire further evaluation   PHYSICAL EXAM:   Vitals:   10/25/21 1104  BP: 132/71  Pulse: 81  Weight: 117 lb 8 oz (53.3 kg)  Height: $Remove'4\' 9"'DMZiosj$  (1.448 m)  BP sitting down: 146/71, HR 76, standing up 123/70, HR 80, standing up one minute 149/74, HR 80   Body mass index is 25.43 kg/m.  PHYSICAL EXAMNIATION:  Gen: NAD, conversant, well nourised, well groomed                     Cardiovascular: Regular rate rhythm, no peripheral edema, warm, nontender. Eyes: Conjunctivae clear without exudates or hemorrhage Neck: Supple, no carotid bruits. Pulmonary: Clear to auscultation bilaterally   NEUROLOGICAL EXAM:  MENTAL STATUS: Speech/cognition: Slow to respond, absent, dependent on her son for comprehensive medical history   CRANIAL NERVES: CN II: Visual fields are full to  confrontation. Pupils are round equal and briskly reactive to light. CN III, IV, VI: extraocular movement are normal. No ptosis. CN V: Facial sensation is intact to light touch CN VII: Face is symmetric with normal eye closure  CN  VIII: Hearing is normal to causal conversation. CN IX, X: Phonation is normal. CN XI: Head turning and shoulder shrug are intact  MOTOR: Frequent head titubation, mild retrocollis There is no pronator drift of out-stretched arms. Muscle bulk and tone are normal. Muscle strength is normal.  REFLEXES: Reflexes are 2+ and symmetric at the biceps, triceps, knees, and ankles. Plantar responses are flexor.  SENSORY: Intact to light touch, pinprick and vibratory sensation are intact in fingers and toes.  COORDINATION: There is no trunk or limb dysmetria noted.  GAIT/STANCE: She needs push-up to get up from seated position, fairly steady  REVIEW OF SYSTEMS:  Full 14 system review of systems performed and notable only for as above All other review of systems were negative.   ALLERGIES: Allergies  Allergen Reactions   Crestor [Rosuvastatin Calcium] Other (See Comments)    "caused me to go into kidney failure"    HOME MEDICATIONS: Current Outpatient Medications  Medication Sig Dispense Refill   amLODipine (NORVASC) 5 MG tablet TAKE 1 TABLET(5 MG) BY MOUTH TWICE DAILY (Patient taking differently: Take 5 mg by mouth 2 (two) times daily.) 180 tablet 2   aspirin EC 81 MG EC tablet Take 1 tablet (81 mg total) by mouth daily. Swallow whole. 30 tablet 1   blood glucose meter kit and supplies Dispense based on patient and insurance preference. Use up to four times daily as directed. (FOR ICD-10 E10.9, E11.9). 1 each 0   Calcium Carbonate (CALCIUM 600 PO) Take 1 tablet by mouth daily.     Cholecalciferol (VITAMIN D) 125 MCG (5000 UT) CAPS Take 5,000 Units by mouth daily.     Cinnamon 500 MG capsule Take 1,000 mg by mouth daily.     clopidogrel (PLAVIX) 75 MG tablet Take 1 tablet (75 mg total) by mouth daily for 21 days. 21 tablet 0   ezetimibe (ZETIA) 10 MG tablet Take 1 tablet (10 mg total) by mouth daily. 30 tablet 1   fluticasone (FLONASE) 50 MCG/ACT nasal spray Place 2 sprays into both  nostrils daily. (Patient taking differently: Place 2 sprays into both nostrils daily as needed for allergies.) 16 g 1   incobotulinumtoxinA (XEOMIN) 100 units SOLR injection Inject 100 Units into the muscle once.     Lancets (ONETOUCH DELICA PLUS [id number]) MISC USE TO CHECK BLOOD SUGAR UP TO FOUR TIMES DAILY AS DIRECTED 100 each 1   latanoprost (XALATAN) 0.005 % ophthalmic solution Place 1 drop into both eyes every evening.     levothyroxine (SYNTHROID) 50 MCG tablet TAKE ONE TABLET BY MOUTH DAILY BEFORE BREAKFAST (Patient taking differently: Take 50 mcg by mouth daily before breakfast.) 90 tablet 0   meclizine (ANTIVERT) 12.5 MG tablet Take 1 tablet (12.5 mg total) by mouth 3 (three) times daily as needed for dizziness. 30 tablet 0   melatonin 5 MG TABS Take 5 mg by mouth at bedtime as needed (sleep).     Menaquinone-7 (VITAMIN K2 PO) Take 1 tablet by mouth at bedtime.     metoprolol tartrate (LOPRESSOR) 50 MG tablet TAKE ONE TABLET BY MOUTH TWICE A DAY (Patient taking differently: Take 50 mg by mouth 2 (two) times daily.) 180 tablet 1   Omega-3 Fatty Acids (OMEGA 3 PO) Take 2 tablets by  mouth daily.     ONETOUCH ULTRA test strip      Phytosterol Esters (CHOLEST CARE PO) Take 1,600 mg by mouth daily.     predniSONE (DELTASONE) 5 MG tablet Take 1 tablet (5 mg total) by mouth daily with breakfast. 30 tablet 0   Probiotic Product (PROBIOTIC DAILY PO) Take 1 tablet by mouth at bedtime.     telmisartan (MICARDIS) 20 MG tablet Take 1 tablet (20 mg total) by mouth daily. (Patient taking differently: Take 20 mg by mouth at bedtime.) 90 tablet 3   vitamin C (ASCORBIC ACID) 500 MG tablet Take 500 mg by mouth daily.     No current facility-administered medications for this visit.    PAST MEDICAL HISTORY: Past Medical History:  Diagnosis Date   Dystonia    Glaucoma    Hypertension    Thyroid disease     PAST SURGICAL HISTORY: Past Surgical History:  Procedure Laterality Date   NO PAST SURGERIES       FAMILY HISTORY: Family History  Problem Relation Age of Onset   Stroke Mother    Heart attack Father    Heart Problems Brother     SOCIAL HISTORY: Social History   Socioeconomic History   Marital status: Widowed    Spouse name: Not on file   Number of children: 1   Years of education: 6 or 7 years of education   Highest education level: Not on file  Occupational History   Occupation: Retired  Tobacco Use   Smoking status: Never   Smokeless tobacco: Never  Vaping Use   Vaping Use: Never used  Substance and Sexual Activity   Alcohol use: No   Drug use: No   Sexual activity: Not on file  Other Topics Concern   Not on file  Social History Narrative   Lives alone.   Right-handed.   No caffeine use.   Social Determinants of Health   Financial Resource Strain: Low Risk    Difficulty of Paying Living Expenses: Not hard at all  Food Insecurity: No Food Insecurity   Worried About Charity fundraiser in the Last Year: Never true   [place] in the Last Year: Never true  Transportation Needs: No Transportation Needs   Lack of Transportation (Medical): No   Lack of Transportation (Non-Medical): No  Physical Activity: Inactive   Days of Exercise per Week: 0 days   Minutes of Exercise per Session: 0 min  Stress: No Stress Concern Present   Feeling of Stress : Not at all  Social Connections: Not on file  Intimate Partner Violence: Not on file    Total time spent reviewing the chart, obtaining history, examined patient, ordering tests, documentation, consultations and family, care coordination was  58 minutes    Marcial Pacas, M.D. Ph.D.  [facility] [address] [place], [place] [address] Ph: [phone] Fax: [phone]  CC:  Elise Benne [address] [place],  [place] [address]  Saguier, Percell Miller, PA-C

## 2021-10-26 LAB — ANA W/REFLEX IF POSITIVE: Anti Nuclear Antibody (ANA): NEGATIVE

## 2021-10-26 LAB — SEDIMENTATION RATE: Sed Rate: 21 mm/hr (ref 0–40)

## 2021-10-26 LAB — C-REACTIVE PROTEIN: CRP: 14 mg/L — ABNORMAL HIGH (ref 0–10)

## 2021-10-26 LAB — CK: Total CK: 43 U/L (ref 26–161)

## 2021-10-30 ENCOUNTER — Other Ambulatory Visit: Payer: Self-pay | Admitting: Internal Medicine

## 2021-10-30 DIAGNOSIS — M7502 Adhesive capsulitis of left shoulder: Secondary | ICD-10-CM | POA: Diagnosis not present

## 2021-10-30 DIAGNOSIS — I10 Essential (primary) hypertension: Secondary | ICD-10-CM | POA: Diagnosis not present

## 2021-10-30 DIAGNOSIS — R55 Syncope and collapse: Secondary | ICD-10-CM | POA: Diagnosis not present

## 2021-10-30 DIAGNOSIS — U099 Post covid-19 condition, unspecified: Secondary | ICD-10-CM | POA: Diagnosis not present

## 2021-10-30 DIAGNOSIS — G8929 Other chronic pain: Secondary | ICD-10-CM

## 2021-10-30 DIAGNOSIS — R700 Elevated erythrocyte sedimentation rate: Secondary | ICD-10-CM

## 2021-10-30 DIAGNOSIS — I69398 Other sequelae of cerebral infarction: Secondary | ICD-10-CM | POA: Diagnosis not present

## 2021-10-30 DIAGNOSIS — E1140 Type 2 diabetes mellitus with diabetic neuropathy, unspecified: Secondary | ICD-10-CM | POA: Diagnosis not present

## 2021-10-30 DIAGNOSIS — M25511 Pain in right shoulder: Secondary | ICD-10-CM | POA: Diagnosis not present

## 2021-10-30 DIAGNOSIS — G9332 Myalgic encephalomyelitis/chronic fatigue syndrome: Secondary | ICD-10-CM | POA: Diagnosis not present

## 2021-10-30 DIAGNOSIS — G243 Spasmodic torticollis: Secondary | ICD-10-CM | POA: Diagnosis not present

## 2021-10-30 DIAGNOSIS — M064 Inflammatory polyarthropathy: Secondary | ICD-10-CM | POA: Diagnosis not present

## 2021-10-31 MED ORDER — PREDNISONE 5 MG PO TABS: 5.0000 mg | ORAL_TABLET | Freq: Every day | ORAL | 1 refills | Status: DC

## 2021-10-31 NOTE — Telephone Encounter (Signed)
Please schedule patient for a follow up visit. Patient is due February 2023. Thanks!  ?

## 2021-10-31 NOTE — Telephone Encounter (Signed)
Next Visit: Due February 2023 (patient no showed 09/21/2021.) ?  ?Last Visit: 08/24/2021 ?  ?Last Fill: 09/26/2021 ?  ?Dx: Sedimentation rate elevation  ?Bilateral shoulder pain ?Inflammatory arthritis ?  ?Current Dose per office note on 08/24/2021: prednisone 5 mg daily for now ?  ?Okay to refill Prednisone?   ?

## 2021-11-01 DIAGNOSIS — E1140 Type 2 diabetes mellitus with diabetic neuropathy, unspecified: Secondary | ICD-10-CM | POA: Diagnosis not present

## 2021-11-01 DIAGNOSIS — M7502 Adhesive capsulitis of left shoulder: Secondary | ICD-10-CM | POA: Diagnosis not present

## 2021-11-01 DIAGNOSIS — I10 Essential (primary) hypertension: Secondary | ICD-10-CM | POA: Diagnosis not present

## 2021-11-01 DIAGNOSIS — U099 Post covid-19 condition, unspecified: Secondary | ICD-10-CM | POA: Diagnosis not present

## 2021-11-01 DIAGNOSIS — G9332 Myalgic encephalomyelitis/chronic fatigue syndrome: Secondary | ICD-10-CM | POA: Diagnosis not present

## 2021-11-01 DIAGNOSIS — R55 Syncope and collapse: Secondary | ICD-10-CM | POA: Diagnosis not present

## 2021-11-01 DIAGNOSIS — I69398 Other sequelae of cerebral infarction: Secondary | ICD-10-CM | POA: Diagnosis not present

## 2021-11-01 DIAGNOSIS — M25511 Pain in right shoulder: Secondary | ICD-10-CM | POA: Diagnosis not present

## 2021-11-01 DIAGNOSIS — G243 Spasmodic torticollis: Secondary | ICD-10-CM | POA: Diagnosis not present

## 2021-11-01 DIAGNOSIS — M064 Inflammatory polyarthropathy: Secondary | ICD-10-CM | POA: Diagnosis not present

## 2021-11-02 DIAGNOSIS — H401122 Primary open-angle glaucoma, left eye, moderate stage: Secondary | ICD-10-CM | POA: Diagnosis not present

## 2021-11-02 DIAGNOSIS — I10 Essential (primary) hypertension: Secondary | ICD-10-CM | POA: Diagnosis not present

## 2021-11-02 DIAGNOSIS — H53461 Homonymous bilateral field defects, right side: Secondary | ICD-10-CM | POA: Diagnosis not present

## 2021-11-02 DIAGNOSIS — R251 Tremor, unspecified: Secondary | ICD-10-CM | POA: Diagnosis not present

## 2021-11-02 DIAGNOSIS — H0100B Unspecified blepharitis left eye, upper and lower eyelids: Secondary | ICD-10-CM | POA: Diagnosis not present

## 2021-11-02 DIAGNOSIS — Z8673 Personal history of transient ischemic attack (TIA), and cerebral infarction without residual deficits: Secondary | ICD-10-CM | POA: Diagnosis not present

## 2021-11-02 DIAGNOSIS — H40021 Open angle with borderline findings, high risk, right eye: Secondary | ICD-10-CM | POA: Diagnosis not present

## 2021-11-02 DIAGNOSIS — H0100A Unspecified blepharitis right eye, upper and lower eyelids: Secondary | ICD-10-CM | POA: Diagnosis not present

## 2021-11-06 ENCOUNTER — Encounter: Payer: Self-pay | Admitting: Neurology

## 2021-11-06 DIAGNOSIS — M064 Inflammatory polyarthropathy: Secondary | ICD-10-CM | POA: Diagnosis not present

## 2021-11-06 DIAGNOSIS — G9332 Myalgic encephalomyelitis/chronic fatigue syndrome: Secondary | ICD-10-CM | POA: Diagnosis not present

## 2021-11-06 DIAGNOSIS — E1140 Type 2 diabetes mellitus with diabetic neuropathy, unspecified: Secondary | ICD-10-CM | POA: Diagnosis not present

## 2021-11-06 DIAGNOSIS — R55 Syncope and collapse: Secondary | ICD-10-CM | POA: Diagnosis not present

## 2021-11-06 DIAGNOSIS — U099 Post covid-19 condition, unspecified: Secondary | ICD-10-CM | POA: Diagnosis not present

## 2021-11-06 DIAGNOSIS — M7502 Adhesive capsulitis of left shoulder: Secondary | ICD-10-CM | POA: Diagnosis not present

## 2021-11-06 DIAGNOSIS — G243 Spasmodic torticollis: Secondary | ICD-10-CM | POA: Diagnosis not present

## 2021-11-06 DIAGNOSIS — I69398 Other sequelae of cerebral infarction: Secondary | ICD-10-CM | POA: Diagnosis not present

## 2021-11-06 DIAGNOSIS — I10 Essential (primary) hypertension: Secondary | ICD-10-CM | POA: Diagnosis not present

## 2021-11-06 DIAGNOSIS — M25511 Pain in right shoulder: Secondary | ICD-10-CM | POA: Diagnosis not present

## 2021-11-07 NOTE — Telephone Encounter (Signed)
I called heartcare in [place] the patient is scheduled for April 18 to see Dr. Shirlee More, they informed me that the patient has to be  seen first before they do a heart monitor.  ?

## 2021-11-08 DIAGNOSIS — I10 Essential (primary) hypertension: Secondary | ICD-10-CM | POA: Diagnosis not present

## 2021-11-08 DIAGNOSIS — G9332 Myalgic encephalomyelitis/chronic fatigue syndrome: Secondary | ICD-10-CM | POA: Diagnosis not present

## 2021-11-08 DIAGNOSIS — M7502 Adhesive capsulitis of left shoulder: Secondary | ICD-10-CM | POA: Diagnosis not present

## 2021-11-08 DIAGNOSIS — G243 Spasmodic torticollis: Secondary | ICD-10-CM | POA: Diagnosis not present

## 2021-11-08 DIAGNOSIS — E1140 Type 2 diabetes mellitus with diabetic neuropathy, unspecified: Secondary | ICD-10-CM | POA: Diagnosis not present

## 2021-11-08 DIAGNOSIS — M25511 Pain in right shoulder: Secondary | ICD-10-CM | POA: Diagnosis not present

## 2021-11-08 DIAGNOSIS — R55 Syncope and collapse: Secondary | ICD-10-CM | POA: Diagnosis not present

## 2021-11-08 DIAGNOSIS — M064 Inflammatory polyarthropathy: Secondary | ICD-10-CM | POA: Diagnosis not present

## 2021-11-08 DIAGNOSIS — I69398 Other sequelae of cerebral infarction: Secondary | ICD-10-CM | POA: Diagnosis not present

## 2021-11-08 DIAGNOSIS — U099 Post covid-19 condition, unspecified: Secondary | ICD-10-CM | POA: Diagnosis not present

## 2021-11-10 ENCOUNTER — Other Ambulatory Visit: Payer: Self-pay | Admitting: Medical

## 2021-11-10 ENCOUNTER — Other Ambulatory Visit: Payer: Self-pay | Admitting: Cardiology

## 2021-11-10 ENCOUNTER — Encounter: Payer: Self-pay | Admitting: Cardiology

## 2021-11-14 DIAGNOSIS — E1140 Type 2 diabetes mellitus with diabetic neuropathy, unspecified: Secondary | ICD-10-CM | POA: Diagnosis not present

## 2021-11-14 DIAGNOSIS — M064 Inflammatory polyarthropathy: Secondary | ICD-10-CM | POA: Diagnosis not present

## 2021-11-14 DIAGNOSIS — I10 Essential (primary) hypertension: Secondary | ICD-10-CM | POA: Diagnosis not present

## 2021-11-14 DIAGNOSIS — R55 Syncope and collapse: Secondary | ICD-10-CM | POA: Diagnosis not present

## 2021-11-14 DIAGNOSIS — G9332 Myalgic encephalomyelitis/chronic fatigue syndrome: Secondary | ICD-10-CM | POA: Diagnosis not present

## 2021-11-14 DIAGNOSIS — G243 Spasmodic torticollis: Secondary | ICD-10-CM | POA: Diagnosis not present

## 2021-11-14 DIAGNOSIS — U099 Post covid-19 condition, unspecified: Secondary | ICD-10-CM | POA: Diagnosis not present

## 2021-11-14 DIAGNOSIS — I69398 Other sequelae of cerebral infarction: Secondary | ICD-10-CM | POA: Diagnosis not present

## 2021-11-14 DIAGNOSIS — M7502 Adhesive capsulitis of left shoulder: Secondary | ICD-10-CM | POA: Diagnosis not present

## 2021-11-14 DIAGNOSIS — M25511 Pain in right shoulder: Secondary | ICD-10-CM | POA: Diagnosis not present

## 2021-11-15 ENCOUNTER — Telehealth: Payer: Self-pay | Admitting: Neurology

## 2021-11-15 NOTE — Telephone Encounter (Signed)
Completed [employer] PA form, placed in Nurse pod for MD signature. ?

## 2021-11-22 DIAGNOSIS — Z9181 History of falling: Secondary | ICD-10-CM | POA: Diagnosis not present

## 2021-11-22 DIAGNOSIS — Z7902 Long term (current) use of antithrombotics/antiplatelets: Secondary | ICD-10-CM | POA: Diagnosis not present

## 2021-11-22 DIAGNOSIS — E1140 Type 2 diabetes mellitus with diabetic neuropathy, unspecified: Secondary | ICD-10-CM | POA: Diagnosis not present

## 2021-11-22 DIAGNOSIS — E039 Hypothyroidism, unspecified: Secondary | ICD-10-CM | POA: Diagnosis not present

## 2021-11-22 DIAGNOSIS — U099 Post covid-19 condition, unspecified: Secondary | ICD-10-CM | POA: Diagnosis not present

## 2021-11-22 DIAGNOSIS — I10 Essential (primary) hypertension: Secondary | ICD-10-CM | POA: Diagnosis not present

## 2021-11-22 DIAGNOSIS — I69398 Other sequelae of cerebral infarction: Secondary | ICD-10-CM | POA: Diagnosis not present

## 2021-11-22 DIAGNOSIS — M064 Inflammatory polyarthropathy: Secondary | ICD-10-CM | POA: Diagnosis not present

## 2021-11-22 DIAGNOSIS — R55 Syncope and collapse: Secondary | ICD-10-CM | POA: Diagnosis not present

## 2021-11-22 DIAGNOSIS — M25511 Pain in right shoulder: Secondary | ICD-10-CM | POA: Diagnosis not present

## 2021-11-22 DIAGNOSIS — G9332 Myalgic encephalomyelitis/chronic fatigue syndrome: Secondary | ICD-10-CM | POA: Diagnosis not present

## 2021-11-22 DIAGNOSIS — G243 Spasmodic torticollis: Secondary | ICD-10-CM | POA: Diagnosis not present

## 2021-11-22 DIAGNOSIS — M7502 Adhesive capsulitis of left shoulder: Secondary | ICD-10-CM | POA: Diagnosis not present

## 2021-11-22 DIAGNOSIS — Z7952 Long term (current) use of systemic steroids: Secondary | ICD-10-CM | POA: Diagnosis not present

## 2021-11-26 DIAGNOSIS — H409 Unspecified glaucoma: Secondary | ICD-10-CM | POA: Insufficient documentation

## 2021-11-26 NOTE — Telephone Encounter (Signed)
Received approval from [place], [place] # [facility] (11/26/2021-11/27/2022). ?

## 2021-11-26 NOTE — Telephone Encounter (Signed)
Faxed signed PA form with OV notes to [employer].  

## 2021-11-27 ENCOUNTER — Encounter: Payer: Self-pay | Admitting: Cardiology

## 2021-11-27 ENCOUNTER — Ambulatory Visit (INDEPENDENT_AMBULATORY_CARE_PROVIDER_SITE_OTHER): Payer: Medicare HMO | Admitting: Cardiology

## 2021-11-27 VITALS — BP 144/60 | HR 65 | Ht <= 58 in | Wt 119.0 lb

## 2021-11-27 DIAGNOSIS — I639 Cerebral infarction, unspecified: Secondary | ICD-10-CM

## 2021-11-27 DIAGNOSIS — E782 Mixed hyperlipidemia: Secondary | ICD-10-CM

## 2021-11-27 DIAGNOSIS — I10 Essential (primary) hypertension: Secondary | ICD-10-CM

## 2021-11-27 NOTE — Progress Notes (Signed)
?Cardiology Office Note:   ? ?Date:  11/27/2021  ? ?ID:  Leslie Ward, DOB December 08, 1930, MRN [id number] ? ?PCP:  Mackie Pai, PA-C  ?Cardiologist:  Shirlee More, MD   ? ?Referring MD: Mackie Pai, PA-C  ? ? ?ASSESSMENT:   ? ?1. Cerebrovascular accident (CVA), unspecified mechanism ([place])   ?2. Primary hypertension   ?3. Mixed hyperlipidemia   ? ?PLAN:   ? ?In order of problems listed above: ? ?She had stroke cerebellar has been on dual antiplatelet therapy now transition back to aspirin and now on lipid-lowering nonstatin Zetia 10 mg daily.  As requested by neurology 30-day monitor ?I think in this situation age recent stroke her blood pressure is reasonably controlled and continue her combination amlodipine telmisartan ?Continue nonstatin ? ? ?Next appointment: 6 months ? ? ?Medication Adjustments/Labs and Tests Ordered: ?Current medicines are reviewed at length with the patient today.  Concerns regarding medicines are outlined above.  ?No orders of the defined types were placed in this encounter. ? ?No orders of the defined types were placed in this encounter. ? ? ?Follow-up, she has had a stroke in the interim ? ? ?History of Present Illness:   ? ?Leslie Ward is a 88 y.o. female with a hx of hypertension treated with calcium channel blocker beta-blocker and ARB Hyperlipidemia with statin intolerance and type 2 diabetes  last seen 06/08/2021. ? ?Compliance with diet, lifestyle and medications: Yes ? ?Her son is present supervises her care. ?Home blood pressure runs in the range of 488 systolic which I think is a good target in her age group ?Prior to and after stroke was having great deal of trouble with frontal headache dizziness vertigo and its improved especially after surgery for glaucoma ?No palpitations syncope chest pain edema shortness of breath ? ?She was [facility] from [facility] 10/12/2021 after a unwitnessed fall and loss of consciousness.  CT showed no acute changes CTA showed  moderate bilateral vertebral artery atherosclerosis MR showed acute infarct in the inferior cerebellum and left medial thalamus due to thromboembolic disease in the PCA.  She was seen by neurology and placed on dual antiplatelet therapy.  She excepted lipid-lowering therapy with Zetia for hyperlipidemia ? ?She had an echocardiogram performed 10/10/2021 left ventricle showed hyperdynamic function EF 70 to 89% grade 1 diastolic dysfunction normal right ventricle aortic valve was mildly calcified and thickened but no regurgitation or stenosis. ? 1. Left ventricular ejection fraction, by estimation, is 70 to 75%. The  ?left ventricle has hyperdynamic function. The left ventricle has no  ?regional wall motion abnormalities. Left ventricular diastolic parameters  ?are consistent with Grade I diastolic  ?dysfunction (impaired relaxation).  ? 2. Right ventricular systolic function is normal. The right ventricular  ?size is normal. There is normal pulmonary artery systolic pressure.  ? 3. The mitral valve is normal in structure. Trivial mitral valve  ?regurgitation.  ? 4. The aortic valve is normal in structure. There is mild calcification  ?of the aortic valve. There is mild thickening of the aortic valve. Aortic  ?valve regurgitation is not visualized.  ? ?Her EKG 10/09/2021 showed sinus rhythm nonspecific ST abnormality normal QRS duration and PR interval there was no heart block or sinus pauses. ? ?MR brain showed acute infarct right inferior cerebellum small areas of acute infarction left medial thalamus as well as atrophy and moderate chronic microvascular ischemic change. ? ?She was seen in follow-up by neurology with a note that they wanted a 30-day ambulatory heart rhythm monitor  performed. ?Past Medical History:  ?Diagnosis Date  ? Dystonia   ? Glaucoma   ? Hypertension   ? Thyroid disease   ? ? ?Past Surgical History:  ?Procedure Laterality Date  ? NO PAST SURGERIES    ? ? ?Current Medications: ?Current Meds   ?Medication Sig  ? amLODipine (NORVASC) 5 MG tablet Take 1 tablet (5 mg total) by mouth 2 (two) times daily.  ? aspirin EC 81 MG EC tablet Take 1 tablet (81 mg total) by mouth daily. Swallow whole.  ? blood glucose meter kit and supplies Dispense based on patient and insurance preference. Use up to four times daily as directed. (FOR ICD-10 E10.9, E11.9).  ? Calcium Carbonate (CALCIUM 600 PO) Take 1 tablet by mouth daily.  ? Cholecalciferol (VITAMIN D) 125 MCG (5000 UT) CAPS Take 5,000 Units by mouth daily.  ? Cinnamon 500 MG capsule Take 1,000 mg by mouth daily.  ? ezetimibe (ZETIA) 10 MG tablet Take 1 tablet (10 mg total) by mouth daily.  ? fluticasone (FLONASE) 50 MCG/ACT nasal spray Place 2 sprays into both nostrils daily. (Patient taking differently: Place 2 sprays into both nostrils daily as needed for allergies.)  ? incobotulinumtoxinA (XEOMIN) 100 units SOLR injection Inject 100 Units into the muscle once.  ? Lancets (ONETOUCH DELICA PLUS [id number]) MISC USE TO CHECK BLOOD SUGAR UP TO FOUR TIMES DAILY AS DIRECTED  ? latanoprost (XALATAN) 0.005 % ophthalmic solution Place 1 drop into both eyes every evening.  ? levothyroxine (SYNTHROID) 50 MCG tablet Take 1 tablet (50 mcg total) by mouth daily before breakfast.  ? meclizine (ANTIVERT) 12.5 MG tablet Take 1 tablet (12.5 mg total) by mouth 3 (three) times daily as needed for dizziness.  ? melatonin 5 MG TABS Take 5 mg by mouth at bedtime as needed (sleep).  ? Menaquinone-7 (VITAMIN K2 PO) Take 1 tablet by mouth at bedtime.  ? metoprolol tartrate (LOPRESSOR) 50 MG tablet TAKE ONE TABLET BY MOUTH TWICE A DAY (Patient taking differently: Take 50 mg by mouth 2 (two) times daily.)  ? Omega-3 Fatty Acids (OMEGA 3 PO) Take 2 tablets by mouth daily.  ? ONETOUCH ULTRA test strip   ? Phytosterol Esters (CHOLEST CARE PO) Take 1,600 mg by mouth daily.  ? predniSONE (DELTASONE) 5 MG tablet Take 1 tablet (5 mg total) by mouth daily with breakfast.  ? Probiotic Product  (PROBIOTIC DAILY PO) Take 1 tablet by mouth at bedtime.  ? telmisartan (MICARDIS) 20 MG tablet Take 1 tablet (20 mg total) by mouth daily.  ? vitamin C (ASCORBIC ACID) 500 MG tablet Take 500 mg by mouth daily.  ?  ? ?Allergies:   Crestor [rosuvastatin calcium]  ? ?Social History  ? ?Socioeconomic History  ? Marital status: Widowed  ?  Spouse name: Not on file  ? Number of children: 1  ? Years of education: 6 or 7 years of education  ? Highest education level: Not on file  ?Occupational History  ? Occupation: Retired  ?Tobacco Use  ? Smoking status: Never  ? Smokeless tobacco: Never  ?Vaping Use  ? Vaping Use: Never used  ?Substance and Sexual Activity  ? Alcohol use: No  ? Drug use: No  ? Sexual activity: Not on file  ?Other Topics Concern  ? Not on file  ?Social History Narrative  ? Lives alone.  ? Right-handed.  ? No caffeine use.  ? ?Social Determinants of Health  ? ?Financial Resource Strain: Low Risk   ? Difficulty of  Paying Living Expenses: Not hard at all  ?Food Insecurity: No Food Insecurity  ? Worried About Charity fundraiser in the Last Year: Never true  ? Ran Out of Food in the Last Year: Never true  ?Transportation Needs: No Transportation Needs  ? Lack of Transportation (Medical): No  ? Lack of Transportation (Non-Medical): No  ?Physical Activity: Inactive  ? Days of Exercise per Week: 0 days  ? Minutes of Exercise per Session: 0 min  ?Stress: No Stress Concern Present  ? Feeling of Stress : Not at all  ?Social Connections: Not on file  ?  ? ?Family History: ?The patient's family history includes Heart Problems in her brother; Heart attack in her father; Stroke in her mother. ?ROS:   ?Please see the history of present illness.    ?All other systems reviewed and are negative. ? ?EKGs/Labs/Other Studies Reviewed:   ? ?The following studies were reviewed today: ? ? ? ?Recent Labs: ?10/09/2021: ALT 11 ?10/10/2021: BUN 14; Creatinine, Ser 0.83; Hemoglobin 12.7; Platelets 254; Potassium 3.3; Sodium 140; TSH  5.541  ?Recent Lipid Panel ?   ?Component Value Date/Time  ? CHOL 225 (H) 10/11/2021 1107  ? CHOL 275 (H) 11/09/2020 1101  ? TRIG 129 10/11/2021 1107  ? HDL 72 10/11/2021 1107  ? HDL 67 11/09/2020 1101  ? CHOLHDL 3.1

## 2021-11-27 NOTE — Patient Instructions (Signed)
Medication Instructions:  ?Your physician recommends that you continue on your current medications as directed. Please refer to the Current Medication list given to you today. ? ?*If you need a refill on your cardiac medications before your next appointment, please call your pharmacy* ? ? ?Lab Work: ?None ?If you have labs (blood work) drawn today and your tests are completely normal, you will receive your results only by: ?MyChart Message (if you have MyChart) OR ?A paper copy in the mail ?If you have any lab test that is abnormal or we need to change your treatment, we will call you to review the results. ? ? ?Testing/Procedures: ?Your physician has recommended that you wear an event monitor. Event monitors are medical devices that record the heart?s electrical activity. Doctors most often [place] these monitors to diagnose arrhythmias. Arrhythmias are problems with the speed or rhythm of the heartbeat. The monitor is a small, portable device. You can wear one while you do your normal daily activities. This is usually used to diagnose what is causing palpitations/syncope (passing out). ? ? ? ?Follow-Up: ?At [facility], you and your health needs are our priority.  As part of our continuing mission to provide you with exceptional heart care, we have created designated Provider Care Teams.  These Care Teams include your primary Cardiologist (physician) and Advanced Practice Providers (APPs -  Physician Assistants and Nurse Practitioners) who all work together to provide you with the care you need, when you need it. ? ?We recommend signing up for the patient portal called "MyChart".  Sign up information is provided on this After Visit Summary.  MyChart is used to connect with patients for Virtual Visits (Telemedicine).  Patients are able to view lab/test results, encounter notes, upcoming appointments, etc.  Non-urgent messages can be sent to your provider as well.   ?To learn more about what you can do with MyChart,  go to [email or web address].   ? ?Your next appointment:   ?6 month(s) ? ?The format for your next appointment:   ?In Person ? ?Provider:   ?Shirlee More, MD  ? ? ?Other Instructions ?None ? ?Important Information About Sugar ? ? ? ? ? ? ?

## 2021-11-28 ENCOUNTER — Other Ambulatory Visit: Payer: Self-pay | Admitting: Medical

## 2021-11-28 DIAGNOSIS — I10 Essential (primary) hypertension: Secondary | ICD-10-CM | POA: Diagnosis not present

## 2021-11-28 DIAGNOSIS — Z7952 Long term (current) use of systemic steroids: Secondary | ICD-10-CM | POA: Diagnosis not present

## 2021-11-28 DIAGNOSIS — M7502 Adhesive capsulitis of left shoulder: Secondary | ICD-10-CM | POA: Diagnosis not present

## 2021-11-28 DIAGNOSIS — Z9181 History of falling: Secondary | ICD-10-CM | POA: Diagnosis not present

## 2021-11-28 DIAGNOSIS — I69398 Other sequelae of cerebral infarction: Secondary | ICD-10-CM | POA: Diagnosis not present

## 2021-11-28 DIAGNOSIS — R55 Syncope and collapse: Secondary | ICD-10-CM | POA: Diagnosis not present

## 2021-11-28 DIAGNOSIS — E1140 Type 2 diabetes mellitus with diabetic neuropathy, unspecified: Secondary | ICD-10-CM | POA: Diagnosis not present

## 2021-11-28 DIAGNOSIS — G243 Spasmodic torticollis: Secondary | ICD-10-CM | POA: Diagnosis not present

## 2021-11-28 DIAGNOSIS — M064 Inflammatory polyarthropathy: Secondary | ICD-10-CM | POA: Diagnosis not present

## 2021-11-28 DIAGNOSIS — Z7902 Long term (current) use of antithrombotics/antiplatelets: Secondary | ICD-10-CM | POA: Diagnosis not present

## 2021-11-28 DIAGNOSIS — U099 Post covid-19 condition, unspecified: Secondary | ICD-10-CM | POA: Diagnosis not present

## 2021-11-28 DIAGNOSIS — E039 Hypothyroidism, unspecified: Secondary | ICD-10-CM | POA: Diagnosis not present

## 2021-11-28 DIAGNOSIS — M25511 Pain in right shoulder: Secondary | ICD-10-CM | POA: Diagnosis not present

## 2021-11-28 DIAGNOSIS — G9332 Myalgic encephalomyelitis/chronic fatigue syndrome: Secondary | ICD-10-CM | POA: Diagnosis not present

## 2021-12-04 ENCOUNTER — Ambulatory Visit: Payer: Medicare HMO | Admitting: Neurology

## 2021-12-05 ENCOUNTER — Ambulatory Visit: Payer: Medicare HMO | Admitting: Neurology

## 2021-12-10 ENCOUNTER — Ambulatory Visit: Payer: Medicare HMO | Admitting: Cardiology

## 2021-12-13 NOTE — Progress Notes (Deleted)
Office Visit Note  Patient: Leslie Ward             Date of Birth: 10-Dec-1930           MRN: [id number]             PCP: Elise Benne Referring: Elise Benne Visit Date: 12/17/2021   Subjective:  No chief complaint on file.   History of Present Illness: Leslie Ward is a 88 y.o. female here for follow up for  joint pain involving neck, shoulders, and wrists with elevated sedimentation rate.   Previous HPI 08/24/2021 Leslie Ward is a 88 y.o. female here for evaluation of joint pain involving neck, shoulders, and wrists with elevated sedimentation rate.  She was ill with COVID in August of last year without severe acute complications and responded well to treatment with pack Slo-Bid.  However afterwards developed severe joint pain and stiffness in multiple areas worst affected in her neck shoulders and knees.  Her joint pains are worst first thing in the morning with a partial degree of improvement during the day.  She never noticed obvious visible swelling or overlying skin changes in affected areas. Besides the joint pains she is experiencing vertigo symptoms since her illness. She has some chronic osteoarthritis changes and dystonia with previous neck botox injections but nothing similar to the current symptoms. Imaging did not demonstrate any obvious related changes lab results showing very highly elevated ESR and CRP. She also developed probably UTI with E. Faecalis on urine culture which was treated during this time. Last month she resumed on prednisone at 5 mg daily dose which was controlling symptoms well but stopped it this week prior to our visit. She notices symptoms come back within about 2 days after stopping any steroids.   Her son Jacqulyn Bath is present at our visit today.   Labs reviewed 08/2021 ESR 75   04/2021 ANA neg RF neg ESR >130 CRP 152.1   No Rheumatology ROS completed.   PMFS History:  Patient Active Problem List   Diagnosis Date  Noted   Glaucoma 11/26/2021   Cerebrovascular accident (CVA) ([place]) 10/25/2021   Syncope 10/09/2021   Primary open angle glaucoma (POAG) of both eyes, moderate stage 09/12/2021   Sedimentation rate elevation 08/24/2021   Bilateral shoulder pain 08/24/2021   Inflammatory arthritis 08/24/2021   Urinary frequency 08/24/2021   COVID 05/29/2021   Post-COVID chronic fatigue 05/29/2021   Post-COVID chronic joint pain 05/29/2021   Post-COVID chronic muscle pain 05/29/2021   Thyroid disease    Ganglion cyst 08/25/2020   Mixed incontinence 03/15/2020   Urgency incontinence 03/15/2020   Vertigo 12/01/2019   Cervical dystonia 03/08/2019   Hyperlipidemia 02/11/2019   Edema 02/11/2019   Hypertension 01/19/2019   Type 2 diabetes mellitus ([place]) 01/19/2019   Hypothyroidism (acquired) 01/19/2019   Neck pain 01/19/2019   Dystonia 01/19/2019   Adhesive capsulitis of left shoulder 12/22/2018    Past Medical History:  Diagnosis Date   Dystonia    Glaucoma    Hypertension    Thyroid disease     Family History  Problem Relation Age of Onset   Stroke Mother    Heart attack Father    Heart Problems Brother    Past Surgical History:  Procedure Laterality Date   NO PAST SURGERIES     Social History   Social History Narrative   Lives alone.   Right-handed.   No caffeine use.   Immunization History  Administered Date(s) Administered  Fluad Quad(high Dose 65+) 06/11/2019, 06/13/2020   Influenza-Unspecified 07/12/2021   Moderna Covid-19 Vaccine Bivalent Booster 41yr & up 06/12/2021   Moderna SARS-COV2 Booster Vaccination 07/21/2020   PFIZER(Purple Top)SARS-COV-2 Vaccination 12/20/2019, 01/10/2020     Objective: Vital Signs: There were no vitals taken for this visit.   Physical Exam   Musculoskeletal Exam: ***  CDAI Exam: CDAI Score: -- Patient Global: --; Provider Global: -- Swollen: --; Tender: -- Joint Exam 12/17/2021   No joint exam has been documented for this visit    There is currently no information documented on the homunculus. Go to the Rheumatology activity and complete the homunculus joint exam.  Investigation: No additional findings.  Imaging: No results found.  Recent Labs: Lab Results  Component Value Date   WBC 12.2 (H) 10/10/2021   HGB 12.7 10/10/2021   PLT 254 10/10/2021   NA 140 10/10/2021   K 3.3 (L) 10/10/2021   CL 104 10/10/2021   CO2 28 10/10/2021   GLUCOSE 78 10/10/2021   BUN 14 10/10/2021   CREATININE 0.83 10/10/2021   BILITOT 1.0 10/09/2021   ALKPHOS 55 10/09/2021   AST 16 10/09/2021   ALT 11 10/09/2021   PROT 7.2 10/09/2021   ALBUMIN 3.7 10/09/2021   CALCIUM 9.0 10/10/2021   GFRAA >60 03/22/2020    Speciality Comments: No specialty comments available.  Procedures:  No procedures performed Allergies: Crestor [rosuvastatin calcium]   Assessment / Plan:     Visit Diagnoses: No diagnosis found.  ***  Orders: No orders of the defined types were placed in this encounter.  No orders of the defined types were placed in this encounter.    Follow-Up Instructions: No follow-ups on file.   MEarnestine Mealing CMA  Note - This record has been created using DEditor, commissioning  Chart creation errors have been sought, but may not always  have been located. Such creation errors do not reflect on  the standard of medical care.

## 2021-12-16 ENCOUNTER — Encounter: Payer: Self-pay | Admitting: Cardiology

## 2021-12-17 ENCOUNTER — Ambulatory Visit: Payer: Medicare HMO | Admitting: Internal Medicine

## 2021-12-17 ENCOUNTER — Other Ambulatory Visit: Payer: Self-pay

## 2021-12-17 DIAGNOSIS — R700 Elevated erythrocyte sedimentation rate: Secondary | ICD-10-CM

## 2021-12-17 DIAGNOSIS — U099 Post covid-19 condition, unspecified: Secondary | ICD-10-CM

## 2021-12-17 DIAGNOSIS — G8929 Other chronic pain: Secondary | ICD-10-CM

## 2021-12-17 DIAGNOSIS — E119 Type 2 diabetes mellitus without complications: Secondary | ICD-10-CM

## 2021-12-17 DIAGNOSIS — R350 Frequency of micturition: Secondary | ICD-10-CM

## 2021-12-17 MED ORDER — EZETIMIBE 10 MG PO TABS: 10.0000 mg | ORAL_TABLET | Freq: Every day | ORAL | 1 refills | Status: DC

## 2021-12-17 MED ORDER — ASPIRIN 81 MG PO TBEC: 81.0000 mg | DELAYED_RELEASE_TABLET | Freq: Every day | ORAL | 1 refills | Status: DC

## 2021-12-17 NOTE — Telephone Encounter (Signed)
Zetia 10mg  and ASA 81mg  refilled HT [facility].  ?

## 2021-12-25 ENCOUNTER — Encounter: Payer: Self-pay | Admitting: Medical

## 2021-12-25 ENCOUNTER — Ambulatory Visit (INDEPENDENT_AMBULATORY_CARE_PROVIDER_SITE_OTHER): Payer: Medicare HMO | Admitting: Medical

## 2021-12-25 VITALS — BP 150/70 | HR 76 | Resp 18 | Ht <= 58 in | Wt 117.8 lb

## 2021-12-25 DIAGNOSIS — I10 Essential (primary) hypertension: Secondary | ICD-10-CM

## 2021-12-25 DIAGNOSIS — K649 Unspecified hemorrhoids: Secondary | ICD-10-CM

## 2021-12-25 DIAGNOSIS — E876 Hypokalemia: Secondary | ICD-10-CM

## 2021-12-25 DIAGNOSIS — L290 Pruritus ani: Secondary | ICD-10-CM

## 2021-12-25 LAB — COMPREHENSIVE METABOLIC PANEL
ALT: 12 U/L (ref 0–35)
AST: 14 U/L (ref 0–37)
Albumin: 4.2 g/dL (ref 3.5–5.2)
Alkaline Phosphatase: 60 U/L (ref 39–117)
BUN: 16 mg/dL (ref 6–23)
CO2: 30 mEq/L (ref 19–32)
Calcium: 9.9 mg/dL (ref 8.4–10.5)
Chloride: 100 mEq/L (ref 96–112)
Creatinine, Ser: 0.91 mg/dL (ref 0.40–1.20)
GFR: 56.04 mL/min — ABNORMAL LOW (ref >60.00)
Glucose, Bld: 93 mg/dL (ref 70–99)
Potassium: 4 mEq/L (ref 3.5–5.1)
Sodium: 138 mEq/L (ref 135–145)
Total Bilirubin: 1 mg/dL (ref 0.2–1.2)
Total Protein: 7.2 g/dL (ref 6.0–8.3)

## 2021-12-25 MED ORDER — HYDROCORTISONE ACETATE 25 MG RE SUPP: 25.0000 mg | Freq: Two times a day (BID) | RECTAL | 0 refills | Status: DC

## 2021-12-25 NOTE — Patient Instructions (Addendum)
Rectal pain and itching for one week. On exam appear to have very small tiny hemorrhoid. Rx annusol hc suppository. Avoid constipation. If pain and itching persists or persisting bright red blood on wiping then refer to GI Md.  ? ?Can also use recticare. ? ?Htn- bp on recheck  twice with 150/70-80 readings. Increase telmisartan to 40 mg daily. Continue other bp meds the same ? ?Hypokalemia on last lab check. Get cmp today. ? ?Follow up in 2 weeks or sooner if needed. ?

## 2021-12-25 NOTE — Addendum Note (Signed)
Addended by: Anabel Halon on: 12/25/2021 10:37 AM ? ? Modules accepted: Orders ? ?

## 2021-12-25 NOTE — Progress Notes (Addendum)
? ?Subjective:  ? ? Patient ID: Leslie Ward, female    DOB: 07/09/1931, 88 y.o.   MRN: [id number] ? ?HPI ? ?Pt in for some recent rectal itching and with some rectal pain for more than one week. Pt in past and responded to cream. Some slight bright red blood on toilet paper.  ? ?Pt has high bp today. Lopressor 50 mg twice daily. Telmisartan 20 mg daily. Amlodipine 5 mg daily. Pt does have bp cuff at home. Systolic 782.  ?Cmp done shows gfr over 60. ? ? ? ? ? ?Review of Systems  ?Constitutional:  Negative for chills, fatigue and fever.  ?Respiratory:  Negative for cough, chest tightness, shortness of breath and wheezing.   ?Cardiovascular:  Negative for chest pain and palpitations.  ?Gastrointestinal:  Positive for rectal pain. Negative for abdominal pain, blood in stool and constipation.  ?     Itching as well.  ?Genitourinary:  Negative for dyspareunia and dysuria.  ?Musculoskeletal:  Negative for back pain.  ?Skin:  Negative for rash.  ?Neurological:  Negative for dizziness and headaches.  ?Hematological:  Negative for adenopathy. Does not bruise/bleed easily.  ?Psychiatric/Behavioral:  Negative for behavioral problems and confusion.   ? ? ?Past Medical History:  ?Diagnosis Date  ? Dystonia   ? Glaucoma   ? Hypertension   ? Thyroid disease   ? ?  ?Social History  ? ?Socioeconomic History  ? Marital status: Widowed  ?  Spouse name: Not on file  ? Number of children: 1  ? Years of education: 6 or 7 years of education  ? Highest education level: Not on file  ?Occupational History  ? Occupation: Retired  ?Tobacco Use  ? Smoking status: Never  ? Smokeless tobacco: Never  ?Vaping Use  ? Vaping Use: Never used  ?Substance and Sexual Activity  ? Alcohol use: No  ? Drug use: No  ? Sexual activity: Not on file  ?Other Topics Concern  ? Not on file  ?Social History Narrative  ? Lives alone.  ? Right-handed.  ? No caffeine use.  ? ?Social Determinants of Health  ? ?Financial Resource Strain: Low Risk   ? Difficulty of  Paying Living Expenses: Not hard at all  ?Food Insecurity: No Food Insecurity  ? Worried About Charity fundraiser in the Last Year: Never true  ? Ran Out of Food in the Last Year: Never true  ?Transportation Needs: No Transportation Needs  ? Lack of Transportation (Medical): No  ? Lack of Transportation (Non-Medical): No  ?Physical Activity: Inactive  ? Days of Exercise per Week: 0 days  ? Minutes of Exercise per Session: 0 min  ?Stress: No Stress Concern Present  ? Feeling of Stress : Not at all  ?Social Connections: Not on file  ?Intimate Partner Violence: Not on file  ? ? ?Past Surgical History:  ?Procedure Laterality Date  ? NO PAST SURGERIES    ? ? ?Family History  ?Problem Relation Age of Onset  ? Stroke Mother   ? Heart attack Father   ? Heart Problems Brother   ? ? ?Allergies  ?Allergen Reactions  ? Crestor [Rosuvastatin Calcium] Other (See Comments)  ?  "caused me to go into kidney failure"  ? ? ?Current Outpatient Medications on File Prior to Visit  ?Medication Sig Dispense Refill  ? amLODipine (NORVASC) 5 MG tablet Take 1 tablet (5 mg total) by mouth 2 (two) times daily. 180 tablet 1  ? aspirin 81 MG EC tablet Take  1 tablet (81 mg total) by mouth daily. Swallow whole. 30 tablet 1  ? blood glucose meter kit and supplies Dispense based on patient and insurance preference. Use up to four times daily as directed. (FOR ICD-10 E10.9, E11.9). 1 each 0  ? Calcium Carbonate (CALCIUM 600 PO) Take 1 tablet by mouth daily.    ? Cholecalciferol (VITAMIN D) 125 MCG (5000 UT) CAPS Take 5,000 Units by mouth daily.    ? Cinnamon 500 MG capsule Take 1,000 mg by mouth daily.    ? ezetimibe (ZETIA) 10 MG tablet Take 1 tablet (10 mg total) by mouth daily. 30 tablet 1  ? fluticasone (FLONASE) 50 MCG/ACT nasal spray Place 2 sprays into both nostrils daily. (Patient taking differently: Place 2 sprays into both nostrils daily as needed for allergies.) 16 g 1  ? incobotulinumtoxinA (XEOMIN) 100 units SOLR injection Inject 100  Units into the muscle once.    ? Lancets (ONETOUCH DELICA PLUS LANCET33G) MISC USE TO CHECK BLOOD SUGAR UP TO FOUR TIMES DAILY AS DIRECTED 100 each 1  ? latanoprost (XALATAN) 0.005 % ophthalmic solution Place 1 drop into both eyes every evening.    ? levothyroxine (SYNTHROID) 50 MCG tablet Take 1 tablet (50 mcg total) by mouth daily before breakfast. 90 tablet 1  ? meclizine (ANTIVERT) 12.5 MG tablet Take 1 tablet (12.5 mg total) by mouth 3 (three) times daily as needed for dizziness. 30 tablet 0  ? melatonin 5 MG TABS Take 5 mg by mouth at bedtime as needed (sleep).    ? Menaquinone-7 (VITAMIN K2 PO) Take 1 tablet by mouth at bedtime.    ? metoprolol tartrate (LOPRESSOR) 50 MG tablet TAKE ONE TABLET BY MOUTH TWICE A DAY 180 tablet 1  ? Omega-3 Fatty Acids (OMEGA 3 PO) Take 2 tablets by mouth daily.    ? ONETOUCH ULTRA test strip     ? Phytosterol Esters (CHOLEST CARE PO) Take 1,600 mg by mouth daily.    ? predniSONE (DELTASONE) 5 MG tablet Take 1 tablet (5 mg total) by mouth daily with breakfast. 30 tablet 1  ? Probiotic Product (PROBIOTIC DAILY PO) Take 1 tablet by mouth at bedtime.    ? telmisartan (MICARDIS) 20 MG tablet Take 1 tablet (20 mg total) by mouth daily. 90 tablet 1  ? vitamin C (ASCORBIC ACID) 500 MG tablet Take 500 mg by mouth daily.    ? ?No current facility-administered medications on file prior to visit.  ? ? ?BP (!) 165/60   Pulse 76   Resp 18   Ht 4' 9" (1.448 m)   Wt 117 lb 12.8 oz (53.4 kg)   SpO2 98%   BMI 25.49 kg/m?  ?  ?   ?Objective:  ? Physical Exam ? ?General ?Mental Status- Alert. General Appearance- Not in acute distress.  ? ?Skin ?General: Color- Normal Color. Moisture- Normal Moisture. ? ? ? ?Chest and Lung Exam ?Auscultation: ?Breath Sounds:-Normal. ? ?Cardiovascular ?Auscultation:Rythm- Regular. ?Murmurs & Other Heart Sounds:Auscultation of the heart reveals- No Murmurs. ? ?Abdomen ?Inspection:-Inspeection Normal. ?Palpation/Percussion:Note:No mass. Palpation and Percussion  of the abdomen reveal- Non Tender, Non Distended + BS, no rebound or guarding. ? ?Neurologic ?Cranial Nerve exam:- CN III-XII intact(No nystagmus), symmetric smile. ?Strength:- 5/5 equal and symmetric strength both upper and lower extremities.  ? ? ?Rectal- (laying on rt side )at 3 oclock postion 2 small tiny non thrombosed hemorrhoid. No fissure seen. ? ?   ?Assessment & Plan:  ? ?Patient Instructions  ?Rectal pain   and itching for one week. On exam appear to have very small tiny hemorrhoid. Rx annusol hc suppository. Avoid constipation. If pain and itching persists or persisting bright red blood on wiping then refer to GI Md.  ? ?Can also use recticare. ? ?Htn- bp on recheck  twice with 150/70-80 readings. Increase telmisartan to 40 mg daily. ? ?Hypokalemia on last lab check. Get cmp today. ? ?Follow up in 2 weeks or sooner if needed.  ?

## 2022-01-02 ENCOUNTER — Telehealth: Payer: Self-pay | Admitting: *Deleted

## 2022-01-02 ENCOUNTER — Ambulatory Visit: Payer: Medicare HMO | Admitting: Internal Medicine

## 2022-01-02 ENCOUNTER — Other Ambulatory Visit: Payer: Self-pay | Admitting: Internal Medicine

## 2022-01-02 DIAGNOSIS — U099 Post covid-19 condition, unspecified: Secondary | ICD-10-CM

## 2022-01-02 DIAGNOSIS — R700 Elevated erythrocyte sedimentation rate: Secondary | ICD-10-CM

## 2022-01-02 DIAGNOSIS — G8929 Other chronic pain: Secondary | ICD-10-CM

## 2022-01-02 DIAGNOSIS — M1990 Unspecified osteoarthritis, unspecified site: Secondary | ICD-10-CM

## 2022-01-02 DIAGNOSIS — R350 Frequency of micturition: Secondary | ICD-10-CM

## 2022-01-02 DIAGNOSIS — E119 Type 2 diabetes mellitus without complications: Secondary | ICD-10-CM

## 2022-01-02 NOTE — Telephone Encounter (Unsigned)
Paient's son Leslie Ward called and rescheduled Leslie Ward's appointment due to not being able to bring her to appointment.  He is requesting prescription refill of Prednisone to be sent to [facility] in [facility].

## 2022-01-02 NOTE — Telephone Encounter (Signed)
Next Visit: 01/09/2022  Last Visit: 08/24/2021  Last Fill:10/31/2021  Dx: Sedimentation rate elevation   Current Dose per office note on 08/24/2021: prednisone 5 mg daily for now  Okay to refill Prednisone?

## 2022-01-02 NOTE — Telephone Encounter (Signed)
Patient was enrolled 11/27/2021 for [employer] to ship a 30 day cardiac event monitor to the address on file. 01/01/22 we received a cancellation notification from [employer] for that cardiac event monitor. Called [employer] to inquire and was told the monitor was delivered but never applied/ baselined. Spoke with son, Jacqulyn Bath, who stated he thought calls coming in from [place] were telemarketing calls and did not return.  He has called [employer] and informed them he will apply the monitor this weekend and call them to baseline.

## 2022-01-02 NOTE — Telephone Encounter (Signed)
Duplicate, error

## 2022-01-03 MED ORDER — PREDNISONE 5 MG PO TABS: 5.0000 mg | ORAL_TABLET | Freq: Every day | ORAL | 1 refills | Status: DC

## 2022-01-07 ENCOUNTER — Ambulatory Visit (INDEPENDENT_AMBULATORY_CARE_PROVIDER_SITE_OTHER): Payer: Medicare HMO

## 2022-01-07 DIAGNOSIS — I4891 Unspecified atrial fibrillation: Secondary | ICD-10-CM

## 2022-01-07 DIAGNOSIS — I6389 Other cerebral infarction: Secondary | ICD-10-CM

## 2022-01-07 DIAGNOSIS — I10 Essential (primary) hypertension: Secondary | ICD-10-CM

## 2022-01-07 DIAGNOSIS — E782 Mixed hyperlipidemia: Secondary | ICD-10-CM

## 2022-01-07 DIAGNOSIS — I639 Cerebral infarction, unspecified: Secondary | ICD-10-CM | POA: Diagnosis not present

## 2022-01-08 ENCOUNTER — Ambulatory Visit: Payer: Medicare HMO | Admitting: Medical

## 2022-01-08 NOTE — Progress Notes (Unsigned)
Office Visit Note  Patient: Leslie Ward             Date of Birth: 1930-12-02           MRN: [id number]             PCP: Elise Benne Referring: Elise Benne Visit Date: 01/09/2022   Subjective:  No chief complaint on file.   History of Present Illness: Leslie Ward is a 88 y.o. female here for follow up for evaluation of joint pain involving neck, shoulders, and wrists with elevated sedimentation rate.    Previous HPI 08/24/2021 Leslie Ward is a 88 y.o. female here for evaluation of joint pain involving neck, shoulders, and wrists with elevated sedimentation rate.  She was ill with COVID in August of last year without severe acute complications and responded well to treatment with pack Slo-Bid.  However afterwards developed severe joint pain and stiffness in multiple areas worst affected in her neck shoulders and knees.  Her joint pains are worst first thing in the morning with a partial degree of improvement during the day.  She never noticed obvious visible swelling or overlying skin changes in affected areas. Besides the joint pains she is experiencing vertigo symptoms since her illness. She has some chronic osteoarthritis changes and dystonia with previous neck botox injections but nothing similar to the current symptoms. Imaging did not demonstrate any obvious related changes lab results showing very highly elevated ESR and CRP. She also developed probably UTI with E. Faecalis on urine culture which was treated during this time. Last month she resumed on prednisone at 5 mg daily dose which was controlling symptoms well but stopped it this week prior to our visit. She notices symptoms come back within about 2 days after stopping any steroids.   Her son Jacqulyn Bath is present at our visit today.   Labs reviewed 08/2021 ESR 75   04/2021 ANA neg RF neg ESR >130 CRP 152.1   No Rheumatology ROS completed.   PMFS History:  Patient Active Problem List    Diagnosis Date Noted   Glaucoma 11/26/2021   Cerebrovascular accident (CVA) ([place]) 10/25/2021   Syncope 10/09/2021   Primary open angle glaucoma (POAG) of both eyes, moderate stage 09/12/2021   Sedimentation rate elevation 08/24/2021   Bilateral shoulder pain 08/24/2021   Inflammatory arthritis 08/24/2021   Urinary frequency 08/24/2021   COVID 05/29/2021   Post-COVID chronic fatigue 05/29/2021   Post-COVID chronic joint pain 05/29/2021   Post-COVID chronic muscle pain 05/29/2021   Thyroid disease    Ganglion cyst 08/25/2020   Mixed incontinence 03/15/2020   Urgency incontinence 03/15/2020   Vertigo 12/01/2019   Cervical dystonia 03/08/2019   Hyperlipidemia 02/11/2019   Edema 02/11/2019   Hypertension 01/19/2019   Type 2 diabetes mellitus ([place]) 01/19/2019   Hypothyroidism (acquired) 01/19/2019   Neck pain 01/19/2019   Dystonia 01/19/2019   Adhesive capsulitis of left shoulder 12/22/2018    Past Medical History:  Diagnosis Date   Dystonia    Glaucoma    Hypertension    Thyroid disease     Family History  Problem Relation Age of Onset   Stroke Mother    Heart attack Father    Heart Problems Brother    Past Surgical History:  Procedure Laterality Date   NO PAST SURGERIES     Social History   Social History Narrative   Lives alone.   Right-handed.   No caffeine use.   Immunization History  Administered  Date(s) Administered   Fluad Quad(high Dose 65+) 06/11/2019, 06/13/2020   Influenza-Unspecified 07/12/2021   Moderna Covid-19 Vaccine Bivalent Booster 84yr & up 06/12/2021   Moderna SARS-COV2 Booster Vaccination 07/21/2020   PFIZER(Purple Top)SARS-COV-2 Vaccination 12/20/2019, 01/10/2020     Objective: Vital Signs: There were no vitals taken for this visit.   Physical Exam   Musculoskeletal Exam: ***  CDAI Exam: CDAI Score: -- Patient Global: --; Provider Global: -- Swollen: --; Tender: -- Joint Exam 01/09/2022   No joint exam has been documented for  this visit   There is currently no information documented on the homunculus. Go to the Rheumatology activity and complete the homunculus joint exam.  Investigation: No additional findings.  Imaging: No results found.  Recent Labs: Lab Results  Component Value Date   WBC 12.2 (H) 10/10/2021   HGB 12.7 10/10/2021   PLT 254 10/10/2021   NA 138 12/25/2021   K 4.0 12/25/2021   CL 100 12/25/2021   CO2 30 12/25/2021   GLUCOSE 93 12/25/2021   BUN 16 12/25/2021   CREATININE 0.91 12/25/2021   BILITOT 1.0 12/25/2021   ALKPHOS 60 12/25/2021   AST 14 12/25/2021   ALT 12 12/25/2021   PROT 7.2 12/25/2021   ALBUMIN 4.2 12/25/2021   CALCIUM 9.9 12/25/2021   GFRAA >60 03/22/2020    Speciality Comments: No specialty comments available.  Procedures:  No procedures performed Allergies: Crestor [rosuvastatin calcium]   Assessment / Plan:     Visit Diagnoses: No diagnosis found.  ***  Orders: No orders of the defined types were placed in this encounter.  No orders of the defined types were placed in this encounter.    Follow-Up Instructions: No follow-ups on file.   MEarnestine Mealing CMA  Note - This record has been created using DEditor, commissioning  Chart creation errors have been sought, but may not always  have been located. Such creation errors do not reflect on  the standard of medical care.

## 2022-01-09 ENCOUNTER — Encounter: Payer: Self-pay | Admitting: Internal Medicine

## 2022-01-09 ENCOUNTER — Ambulatory Visit (INDEPENDENT_AMBULATORY_CARE_PROVIDER_SITE_OTHER): Payer: Medicare HMO | Admitting: Internal Medicine

## 2022-01-09 VITALS — BP 152/72 | HR 103 | Resp 12 | Ht <= 58 in | Wt 119.8 lb

## 2022-01-09 DIAGNOSIS — R700 Elevated erythrocyte sedimentation rate: Secondary | ICD-10-CM

## 2022-01-09 DIAGNOSIS — R350 Frequency of micturition: Secondary | ICD-10-CM

## 2022-01-09 DIAGNOSIS — M2550 Pain in unspecified joint: Secondary | ICD-10-CM | POA: Diagnosis not present

## 2022-01-09 DIAGNOSIS — G8929 Other chronic pain: Secondary | ICD-10-CM

## 2022-01-09 DIAGNOSIS — M25512 Pain in left shoulder: Secondary | ICD-10-CM | POA: Diagnosis not present

## 2022-01-09 DIAGNOSIS — U099 Post covid-19 condition, unspecified: Secondary | ICD-10-CM | POA: Diagnosis not present

## 2022-01-09 DIAGNOSIS — E119 Type 2 diabetes mellitus without complications: Secondary | ICD-10-CM

## 2022-01-09 DIAGNOSIS — M25511 Pain in right shoulder: Secondary | ICD-10-CM

## 2022-01-09 DIAGNOSIS — M1990 Unspecified osteoarthritis, unspecified site: Secondary | ICD-10-CM

## 2022-01-10 ENCOUNTER — Encounter: Payer: Self-pay | Admitting: Internal Medicine

## 2022-01-10 LAB — SEDIMENTATION RATE: Sed Rate: 86 mm/h — ABNORMAL HIGH (ref 0–30)

## 2022-01-10 LAB — C-REACTIVE PROTEIN: CRP: 78.8 mg/L — ABNORMAL HIGH (ref <8.0)

## 2022-01-10 NOTE — Progress Notes (Signed)
Inflammatory labs are markedly increased again off the prednisone. I would recommend resuming probably at half the dose, 2.5 mg daily, for 1 month before quitting completely. If symptoms return could increase back to 5 mg if needed and let us know.

## 2022-01-17 ENCOUNTER — Encounter: Payer: Self-pay | Admitting: Medical

## 2022-01-18 NOTE — Addendum Note (Signed)
Addended by: Anabel Halon on: 01/18/2022 12:12 PM   Modules accepted: Orders

## 2022-02-08 ENCOUNTER — Other Ambulatory Visit: Payer: Self-pay | Admitting: Cardiology

## 2022-02-08 DIAGNOSIS — E782 Mixed hyperlipidemia: Secondary | ICD-10-CM

## 2022-02-08 DIAGNOSIS — I639 Cerebral infarction, unspecified: Secondary | ICD-10-CM

## 2022-02-08 DIAGNOSIS — I10 Essential (primary) hypertension: Secondary | ICD-10-CM

## 2022-02-08 DIAGNOSIS — I4891 Unspecified atrial fibrillation: Secondary | ICD-10-CM

## 2022-02-15 ENCOUNTER — Other Ambulatory Visit: Payer: Self-pay | Admitting: Cardiology

## 2022-02-17 ENCOUNTER — Encounter: Payer: Self-pay | Admitting: Cardiology

## 2022-03-04 ENCOUNTER — Encounter: Payer: Self-pay | Admitting: Internal Medicine

## 2022-03-04 DIAGNOSIS — R700 Elevated erythrocyte sedimentation rate: Secondary | ICD-10-CM

## 2022-03-04 DIAGNOSIS — G8929 Other chronic pain: Secondary | ICD-10-CM

## 2022-03-05 NOTE — Telephone Encounter (Signed)
Can you check with them how she is feeling? She is okay to discontinue prednisone if remaining free of pain at this time. If she is having pain again we can resume at 5 mg once daily and follow up after another month at that dose. Thanks.

## 2022-03-05 NOTE — Telephone Encounter (Signed)
Next Visit: not on file  Last Visit: 01/09/2022  Last Fill: 01/03/2022  Dx: Inflammatory arthritis   Current Dose per lab note on 01/09/2022: recommend resuming probably at half the dose, 2.5 mg daily, for 1 month before quitting completely. If symptoms return could increase back to 5 mg if needed and let us know.  Okay to refill Prednisone?

## 2022-03-19 MED ORDER — PREDNISONE 5 MG PO TABS: 2.5000 mg | ORAL_TABLET | Freq: Every day | ORAL | 0 refills | Status: DC

## 2022-03-19 NOTE — Addendum Note (Signed)
Addended by: Fuller Plan on: 03/19/2022 12:19 PM   Modules accepted: Orders

## 2022-04-09 ENCOUNTER — Telehealth: Payer: Self-pay | Admitting: Neurology

## 2022-04-09 NOTE — Telephone Encounter (Signed)
[facility] scheduled with Dr. Terrace Arabia for 04/11/22 at 11:30 am.

## 2022-04-09 NOTE — Telephone Encounter (Signed)
Pt sent message requesting a Botox Injection for neck and head tremor. Please advise.

## 2022-04-09 NOTE — Telephone Encounter (Signed)
I reviewed notes/record. [place] obtained PA through [employer] from 11/26/2021-12/07/2022. One 100 unit vial approved for a total of 4 visits. Per epic this is current coverage for the pt   Authorization # [id number].   Will fwd to schedulers to set appt up with Dr. Terrace Arabia

## 2022-04-11 ENCOUNTER — Ambulatory Visit: Payer: Medicare HMO | Admitting: Neurology

## 2022-05-01 ENCOUNTER — Encounter: Payer: Self-pay | Admitting: Neurology

## 2022-05-01 ENCOUNTER — Ambulatory Visit (INDEPENDENT_AMBULATORY_CARE_PROVIDER_SITE_OTHER): Payer: Medicare HMO | Admitting: Neurology

## 2022-05-01 VITALS — BP 150/68 | HR 76 | Ht <= 58 in | Wt 119.0 lb

## 2022-05-01 DIAGNOSIS — G243 Spasmodic torticollis: Secondary | ICD-10-CM | POA: Diagnosis not present

## 2022-05-01 MED ORDER — CITALOPRAM HYDROBROMIDE 10 MG PO TABS: 10.0000 mg | ORAL_TABLET | Freq: Every day | ORAL | 11 refills | Status: DC

## 2022-05-01 MED ORDER — INCOBOTULINUMTOXINA 100 UNITS IM SOLR: 100.0000 [IU] | INTRAMUSCULAR | Status: AC

## 2022-05-01 NOTE — Progress Notes (Signed)
Chief Complaint  Patient presents with   Procedure    Rm 14. Xeomin 100 units x 1 vial      ASSESSMENT AND PLAN  Leslie Ward is a 88 y.o. female   Acute stroke involving right cerebellum, left thalamus,  Bilateral posterior circulation distribution, CT angiogram of the head showed bilateral vertebral artery moderate stenosis  Could not rule out a possibility of embolic event,  54-GBE cardiac monitoring  Keep follow-up with his cardiologist  Keep aspirin 81 mg daily, emphasized importance of moderate exercise, increase water intake  Also has vascular risk factor of hypertension, hypothyroidism,  Cervical dystonia,  Frequent no-no head titubation, mild retrocollis  Last injection was December 2021, complains of neck muscle weakness with high-dose, only used assuming 100 units a day,  Injection was under EMG guidance, 100 units of Xeomin was dissolved into 2 cc normal saline  Right splenius cervix 12.5 units Left splenius cervix 12.5 units Left splenius capitis 12.5 units Right splenius capitis 12.5 units Right longissimus capitis 12.5 units Left longissimus capitis 12.5 units Right semispinalis 12.5 units Left semispinalis, 12.5units   DIAGNOSTIC DATA (LABS, IMAGING, TESTING) - I reviewed patient records, labs, notes, testing and imaging myself where available. Laboratory evaluations A1c 6.0, lipid panel cholesterol 225 LDL 127, CBC, hemoglobin of 12.7, normal TSH,  MEDICAL HISTORY:  Leslie Ward is a 88 year old female, accompanied by her son, seen in request by her primary care PA Saguier, Percell Miller, for evaluation of stroke, she is accompanied by her son,  I reviewed and summarized the referring note. PMhx History of stroke, Type 2 diabetes Hypertension Hypothyroidism Glaucoma  She had hospital admission on October 09, 2021, left found down in the kitchen following an unwitnessed fall, patient has no recollection of the event, but she complains of  intermittent lightheaded sensation, abnormal sensation in her head,  I personally reviewed MRI of the brain acute infarction at the right inferior cerebellum, left medial thalamus,  Echocardiogram showed normal ejection fraction 70 to 75%, there was no regional wall abnormal movement  CT angiogram of head and neck showed no significant carotid artery stenosis, both vertebral artery are diffusely atherosclerotic change, with moderate stenosis bilaterally, CT angiogram of the brain showed significant intracranial disease  She was not on antiplatelet agent, was put on aspirin 81 mg plus Plavix 75 mg, aspirin 81 mg alone  She has a pending appointment with her cardiologist in May  Because of her recent stroke, and frequent complains of uncomfortable sensation in her head, her son is very frustrated and worried about her condition, desire further evaluation   PHYSICAL EXAM:   Vitals:   05/01/22 1638  BP: (!) 150/68  Pulse: 76  Weight: 119 lb (54 kg)  Height: $Remove'4\' 9"'KBxgzFY$  (1.448 m)  BP sitting down: 146/71, HR 76, standing up 123/70, HR 80, standing up one minute 149/74, HR 80   Body mass index is 25.75 kg/m.  PHYSICAL EXAMNIATION:  Gen: Anxious looking elderly female  NEUROLOGICAL EXAM:  MENTAL STATUS: Speech/cognition: Slow to respond, absent, dependent on her son for comprehensive medical history, frequent head no no titubation mild retrocollis,   CRANIAL NERVES: CN II: Visual fields are full to confrontation. Pupils are round equal and briskly reactive to light. CN III, IV, VI: extraocular movement are normal. No ptosis. CN V: Facial sensation is intact to light touch CN VII: Face is symmetric with normal eye closure  CN VIII: Hearing is normal to causal conversation. CN IX, X: Phonation is normal.  CN XI: Head turning and shoulder shrug are intact  MOTOR: Frequent head titubation, mild retrocollis There is no pronator drift of out-stretched arms. Muscle bulk and tone are  normal. Muscle strength is normal.  REFLEXES: Reflexes are 2+ and symmetric at the biceps, triceps, knees, and ankles. Plantar responses are flexor.  SENSORY: Intact to light touch, pinprick and vibratory sensation are intact in fingers and toes.  COORDINATION: There is no trunk or limb dysmetria noted.  GAIT/STANCE: She needs push-up to get up from seated position, fairly steady  REVIEW OF SYSTEMS:  Full 14 system review of systems performed and notable only for as above All other review of systems were negative.   ALLERGIES: Allergies  Allergen Reactions   Crestor [Rosuvastatin Calcium] Other (See Comments)    "caused me to go into kidney failure"    HOME MEDICATIONS: Current Outpatient Medications  Medication Sig Dispense Refill   amLODipine (NORVASC) 5 MG tablet Take 1 tablet (5 mg total) by mouth 2 (two) times daily. 180 tablet 1   aspirin EC (ASPIRIN LOW DOSE) 81 MG tablet TAKE ONE TABLET BY MOUTH DAILY. DO NOT CRUSH, SWALLOW WHOLE 90 tablet 1   blood glucose meter kit and supplies Dispense based on patient and insurance preference. Use up to four times daily as directed. (FOR ICD-10 E10.9, E11.9). 1 each 0   Calcium Carbonate (CALCIUM 600 PO) Take 1 tablet by mouth daily.     Cholecalciferol (VITAMIN D) 125 MCG (5000 UT) CAPS Take 5,000 Units by mouth daily.     Cinnamon 500 MG capsule Take 1,000 mg by mouth daily.     ezetimibe (ZETIA) 10 MG tablet TAKE ONE TABLET BY MOUTH DAILY 90 tablet 1   hydrocortisone (ANUSOL-HC) 25 MG suppository Place 1 suppository (25 mg total) rectally 2 (two) times daily. (Patient not taking: Reported on 01/09/2022) 14 suppository 0   incobotulinumtoxinA (XEOMIN) 100 units SOLR injection Inject 100 Units into the muscle once. (Patient not taking: Reported on 01/09/2022)     Lancets (ONETOUCH DELICA PLUS [id number]) MISC USE TO CHECK BLOOD SUGAR UP TO FOUR TIMES DAILY AS DIRECTED 100 each 1   latanoprost (XALATAN) 0.005 % ophthalmic solution Place  1 drop into both eyes every evening.     levothyroxine (SYNTHROID) 50 MCG tablet Take 1 tablet (50 mcg total) by mouth daily before breakfast. 90 tablet 1   Menaquinone-7 (VITAMIN K2 PO) Take 1 tablet by mouth at bedtime.     metoprolol tartrate (LOPRESSOR) 50 MG tablet TAKE ONE TABLET BY MOUTH TWICE A DAY 180 tablet 1   Omega-3 Fatty Acids (OMEGA 3 PO) Take 2 tablets by mouth daily.     ONETOUCH ULTRA test strip      Phytosterol Esters (CHOLEST CARE PO) Take 1,600 mg by mouth daily.     predniSONE (DELTASONE) 5 MG tablet Take 0.5 tablets (2.5 mg total) by mouth daily with breakfast. 30 tablet 0   Probiotic Product (PROBIOTIC DAILY PO) Take 1 tablet by mouth at bedtime.     telmisartan (MICARDIS) 20 MG tablet Take 1 tablet (20 mg total) by mouth daily. 90 tablet 1   vitamin C (ASCORBIC ACID) 500 MG tablet Take 500 mg by mouth daily.     Current Facility-Administered Medications  Medication Dose Route Frequency Provider Last Rate Last Admin   incobotulinumtoxinA (XEOMIN) 100 units injection 100 Units  100 Units Intramuscular Q90 days Marcial Pacas, MD        PAST MEDICAL HISTORY: Past Medical History:  Diagnosis Date   Dystonia    Glaucoma    Hypertension    Stroke [facility]) 10/2021   Thyroid disease     PAST SURGICAL HISTORY: Past Surgical History:  Procedure Laterality Date   NO PAST SURGERIES      FAMILY HISTORY: Family History  Problem Relation Age of Onset   Stroke Mother    Heart attack Father    Heart Problems Brother     SOCIAL HISTORY: Social History   Socioeconomic History   Marital status: Widowed    Spouse name: Not on file   Number of children: 1   Years of education: 6 or 7 years of education   Highest education level: Not on file  Occupational History   Occupation: Retired  Tobacco Use   Smoking status: Never   Smokeless tobacco: Never  Vaping Use   Vaping Use: Never used  Substance and Sexual Activity   Alcohol use: No   Drug use: No   Sexual  activity: Not on file  Other Topics Concern   Not on file  Social History Narrative   Lives alone.   Right-handed.   No caffeine use.   Social Determinants of Health   Financial Resource Strain: Low Risk  (10/01/2021)   Overall Financial Resource Strain (CARDIA)    Difficulty of Paying Living Expenses: Not hard at all  Food Insecurity: No Food Insecurity (10/01/2021)   Hunger Vital Sign    Worried About Running Out of Food in the Last Year: Never true    Ran Out of Food in the Last Year: Never true  Transportation Needs: No Transportation Needs (10/01/2021)   PRAPARE - Hydrologist (Medical): No    Lack of Transportation (Non-Medical): No  Physical Activity: Inactive (10/01/2021)   Exercise Vital Sign    Days of Exercise per Week: 0 days    Minutes of Exercise per Session: 0 min  Stress: No Stress Concern Present (10/01/2021)   [place]    Feeling of Stress : Not at all  Social Connections: Not on file  Intimate Partner Violence: Not on file    Total time spent reviewing the chart, obtaining history, examined patient, ordering tests, documentation, consultations and family, care coordination was  14 minutes    Marcial Pacas, M.D. Ph.D.  [facility]----------------------------------------------------------- [address], [place], [place] [address] Ph: [phone] Fax: [phone]  CC:  Elise Benne [address] [place],  [place] [address]  Saguier, Percell Miller, PA-C

## 2022-05-01 NOTE — Progress Notes (Signed)
Xeomin 100 units x 1 vial  LOT #: [id number] NDC: 5997-7414-23 EXP: 2025-08  B/B

## 2022-05-02 ENCOUNTER — Ambulatory Visit: Payer: Medicare HMO | Admitting: Adult Health

## 2022-05-02 DIAGNOSIS — G243 Spasmodic torticollis: Secondary | ICD-10-CM | POA: Diagnosis not present

## 2022-05-03 ENCOUNTER — Other Ambulatory Visit: Payer: Self-pay

## 2022-05-03 ENCOUNTER — Telehealth: Payer: Self-pay

## 2022-05-03 ENCOUNTER — Other Ambulatory Visit: Payer: Self-pay | Admitting: Internal Medicine

## 2022-05-03 DIAGNOSIS — G243 Spasmodic torticollis: Secondary | ICD-10-CM | POA: Diagnosis not present

## 2022-05-03 DIAGNOSIS — R700 Elevated erythrocyte sedimentation rate: Secondary | ICD-10-CM

## 2022-05-03 DIAGNOSIS — G8929 Other chronic pain: Secondary | ICD-10-CM

## 2022-05-03 MED ORDER — INCOBOTULINUMTOXINA 100 UNITS IM SOLR
100.0000 [IU] | INTRAMUSCULAR | Status: AC
  Administered 2022-05-03: 100 [IU] via INTRAMUSCULAR

## 2022-05-03 NOTE — Telephone Encounter (Signed)
Left message for patient to call us back about prednisone.

## 2022-05-03 NOTE — Telephone Encounter (Signed)
When should patient schedule appointment?

## 2022-05-03 NOTE — Telephone Encounter (Signed)
2 mg prednisone tablets are not available. I can order 1 mg tablets if preferring to take 2 of these rather than the 2.5 mg dose.  I recommend we schedule follow up by sometime in November which is  about 6 months.

## 2022-05-03 NOTE — Telephone Encounter (Signed)
Next Visit: Due (not specified in note). Message sent to the front to schedule.   Last Visit: 01/09/2022  Last Fill: 03/19/2022  Dx:  Inflammatory arthritis   Current Dose per office note on 05/03/2022:   Okay to refill prednisone?

## 2022-05-06 ENCOUNTER — Encounter: Payer: Self-pay | Admitting: Adult Health

## 2022-05-06 ENCOUNTER — Other Ambulatory Visit: Payer: Self-pay

## 2022-05-06 ENCOUNTER — Ambulatory Visit: Payer: Medicare HMO | Admitting: Adult Health

## 2022-05-06 MED ORDER — PREDNISONE 1 MG PO TABS: 2.0000 mg | ORAL_TABLET | Freq: Every day | ORAL | 0 refills | Status: DC

## 2022-05-06 NOTE — Telephone Encounter (Signed)
Patient's son Jacqulyn Bath called and said they would like to go down to 2 mg per day with the prednisone.  Next Visit: 06/12/2022  Last Visit: 01/09/2022  Last Fill: 03/19/2022  Dx: Inflammatory arthritis  Current Dose per office note on 01/09/2022: dosage not mentioned.  Okay to refill prednisone?

## 2022-05-06 NOTE — Telephone Encounter (Signed)
LMOM for patient to call and schedule follow-up appointment.   °

## 2022-05-07 ENCOUNTER — Other Ambulatory Visit: Payer: Self-pay | Admitting: Medical

## 2022-05-07 ENCOUNTER — Other Ambulatory Visit: Payer: Self-pay | Admitting: Cardiology

## 2022-06-04 NOTE — Progress Notes (Signed)
Office Visit Note  Patient: Leslie Ward             Date of Birth: [birth year]           MRN: [id number]             PCP: Elise Benne Referring: Elise Benne Visit Date: 06/12/2022   Subjective:  Follow-up (Long term prednisone use, weaning from 5 mg down to 1 mg. Right knee pain.)   History of Present Illness: Leslie Ward is a 88 y.o. female here for follow up for evaluation of joint pain involving neck, shoulders, and wrists with elevated sedimentation rate. She has tapered down the prednisone dose down to 1 mg once daily which she continues and feels this is helpful. Her right knee has been painful particularly in the past 2 weeks. Pain is worst along the inside edge worst at night. She has not seen any visible swelling or erythema.  Previous HPI 01/09/2022 Leslie Ward is a 88 y.o. female here for follow up for evaluation of joint pain involving neck, shoulders, and wrists with elevated sedimentation rate.  She had several complications in the past few months including a hospitalization for new small strokes.  Treatment for her glaucoma the symptoms are improving.  Since her last visit she took the prednisone 5 mg pretty consistently until last week.  She did not pick up the latest prescription awaiting plans from our visit but has not had a significant return of symptoms for about 1 week.   Previous HPI 08/24/2021 Leslie Ward is a 88 y.o. female here for evaluation of joint pain involving neck, shoulders, and wrists with elevated sedimentation rate.  She was ill with COVID in August of last year without severe acute complications and responded well to treatment with paxlovid.  However afterwards developed severe joint pain and stiffness in multiple areas worst affected in her neck shoulders and knees.  Her joint pains are worst first thing in the morning with a partial degree of improvement during the day.  She never noticed obvious visible swelling or  overlying skin changes in affected areas. Besides the joint pains she is experiencing vertigo symptoms since her illness. She has some chronic osteoarthritis changes and dystonia with previous neck botox injections but nothing similar to the current symptoms. Imaging did not demonstrate any obvious related changes lab results showing very highly elevated ESR and CRP. She also developed probably UTI with E. Faecalis on urine culture which was treated during this time. Last month she resumed on prednisone at 5 mg daily dose which was controlling symptoms well but stopped it this week prior to our visit. She notices symptoms come back within about 2 days after stopping any steroids.   Her son Jacqulyn Bath is present at our visit today.   Labs reviewed 08/2021 ESR 75   04/2021 ANA neg RF neg ESR >130 CRP 152.1   Review of Systems  Constitutional:  Positive for fatigue.  HENT:  Negative for mouth sores and mouth dryness.   Eyes:  Negative for dryness.  Respiratory:  Negative for shortness of breath.   Cardiovascular:  Negative for chest pain and palpitations.  Gastrointestinal:  Negative for blood in stool, constipation and diarrhea.  Endocrine: Negative for increased urination.  Genitourinary:  Negative for involuntary urination.  Musculoskeletal:  Positive for joint pain, gait problem, joint pain and joint swelling. Negative for myalgias, muscle weakness, morning stiffness, muscle tenderness and myalgias.  Skin:  Positive for hair loss. Negative  for color change, rash and sensitivity to sunlight.  Allergic/Immunologic: Negative for susceptible to infections.  Neurological:  Positive for dizziness. Negative for headaches.  Hematological:  Negative for swollen glands.  Psychiatric/Behavioral:  Positive for sleep disturbance. Negative for depressed mood. The patient is not nervous/anxious.     PMFS History:  Patient Active Problem List   Diagnosis Date Noted   Glaucoma 11/26/2021    Cerebrovascular accident (CVA) ([place]) 10/25/2021   Syncope 10/09/2021   Primary open angle glaucoma (POAG) of both eyes, moderate stage 09/12/2021   Sedimentation rate elevation 08/24/2021   Bilateral shoulder pain 08/24/2021   Inflammatory arthritis 08/24/2021   Urinary frequency 08/24/2021   COVID 05/29/2021   Post-COVID chronic fatigue 05/29/2021   Post-COVID chronic joint pain 05/29/2021   Post-COVID chronic muscle pain 05/29/2021   Thyroid disease    Ganglion cyst 08/25/2020   Mixed incontinence 03/15/2020   Urgency incontinence 03/15/2020   Vertigo 12/01/2019   Cervical dystonia 03/08/2019   Hyperlipidemia 02/11/2019   Edema 02/11/2019   Hypertension 01/19/2019   Type 2 diabetes mellitus ([place]) 01/19/2019   Hypothyroidism (acquired) 01/19/2019   Neck pain 01/19/2019   Dystonia 01/19/2019   Adhesive capsulitis of left shoulder 12/22/2018    Past Medical History:  Diagnosis Date   Dystonia    Glaucoma    Hypertension    Stroke ([place]) 10/2021   Thyroid disease     Family History  Problem Relation Age of Onset   Stroke Mother    Heart attack Father    Heart Problems Brother    Past Surgical History:  Procedure Laterality Date   NO PAST SURGERIES     Social History   Social History Narrative   Lives alone.   Right-handed.   No caffeine use.   Immunization History  Administered Date(s) Administered   Fluad Quad(high Dose 65+) 06/11/2019, 06/13/2020   Influenza-Unspecified 07/12/2021   Moderna Covid-19 Vaccine Bivalent Booster 54yr & up 06/12/2021   Moderna SARS-COV2 Booster Vaccination 07/21/2020   PFIZER(Purple Top)SARS-COV-2 Vaccination 12/20/2019, 01/10/2020     Objective: Vital Signs: BP 136/67 (BP Location: Left Arm, Patient Position: Sitting, Cuff Size: Normal)   Pulse 74   Ht '4\' 9"'  (1.448 m)   Wt 118 lb 12.8 oz (53.9 kg)   BMI 25.71 kg/m    Physical Exam Eyes:     Conjunctiva/sclera: Conjunctivae normal.  Cardiovascular:     Rate and  Rhythm: Normal rate and regular rhythm.  Pulmonary:     Effort: Pulmonary effort is normal.     Breath sounds: Normal breath sounds.  Musculoskeletal:     Right lower leg: No edema.     Left lower leg: No edema.  Skin:    General: Skin is warm and dry.     Findings: Bruising present.  Neurological:     Mental Status: She is alert.     Comments: Tremor  Psychiatric:        Mood and Affect: Mood normal.      Musculoskeletal Exam:  Shoulders full ROM tenderness to pressure over shoulders and upper arms some pain with resisted abduction and external rotation, no weakness no palpable effusion Elbows full ROM no tenderness or swelling Wrists full ROM no tenderness or swelling Fingers full ROM no tenderness or swelling Knees full ROM, mild medial joint line tenderness, no palpable effusion Ankles full ROM no tenderness or swelling   Investigation: No additional findings.  Imaging: No results found.  Recent Labs: Lab Results  Component  Value Date   WBC 12.2 (H) 10/10/2021   HGB 12.7 10/10/2021   PLT 254 10/10/2021   NA 138 12/25/2021   K 4.0 12/25/2021   CL 100 12/25/2021   CO2 30 12/25/2021   GLUCOSE 93 12/25/2021   BUN 16 12/25/2021   CREATININE 0.91 12/25/2021   BILITOT 1.0 12/25/2021   ALKPHOS 60 12/25/2021   AST 14 12/25/2021   ALT 12 12/25/2021   PROT 7.2 12/25/2021   ALBUMIN 4.2 12/25/2021   CALCIUM 9.9 12/25/2021   GFRAA >60 03/22/2020    Speciality Comments: No specialty comments available.  Procedures:  No procedures performed Allergies: Crestor [rosuvastatin calcium]   Assessment / Plan:     Visit Diagnoses: Inflammatory arthritis  Sedimentation rate elevation Post-COVID chronic joint pain - Plan: Sedimentation rate, C-reactive protein  Ongoing joint pain but right now in only a few joints, no objective synovitis on exam. Seems to be feeling improvement with 1 mg daily prednisone.  Plan to recheck the sedimentation rate and CRP checking for  inflammatory disease activity which was previously quite elevated.  I think be reasonable to try tapering off the remaining prednisone could treat with local steroid injection if joint pains increase in specific areas.  If inflammatory markers remain significantly high or increasing would want to follow-up in case her still ongoing problem developing.  Chronic pain of both shoulders  Currently pain is in distribution more suggestive for bursitis or tendonitis inflammation. Could be candidate for local steroid injection as well if worse or not improving.      Orders: Orders Placed This Encounter  Procedures   Sedimentation rate   C-reactive protein   No orders of the defined types were placed in this encounter.    Follow-Up Instructions: No follow-ups on file.   Collier Salina, MD  Note - This record has been created using [employer].  Chart creation errors have been sought, but may not always  have been located. Such creation errors do not reflect on  the standard of medical care.

## 2022-06-12 ENCOUNTER — Ambulatory Visit: Payer: Medicare HMO | Attending: Internal Medicine | Admitting: Internal Medicine

## 2022-06-12 ENCOUNTER — Encounter: Payer: Self-pay | Admitting: Internal Medicine

## 2022-06-12 VITALS — BP 136/67 | HR 74 | Ht <= 58 in | Wt 118.8 lb

## 2022-06-12 DIAGNOSIS — M2550 Pain in unspecified joint: Secondary | ICD-10-CM

## 2022-06-12 DIAGNOSIS — M1990 Unspecified osteoarthritis, unspecified site: Secondary | ICD-10-CM

## 2022-06-12 DIAGNOSIS — G8929 Other chronic pain: Secondary | ICD-10-CM | POA: Diagnosis not present

## 2022-06-12 DIAGNOSIS — R700 Elevated erythrocyte sedimentation rate: Secondary | ICD-10-CM | POA: Diagnosis not present

## 2022-06-12 DIAGNOSIS — M25511 Pain in right shoulder: Secondary | ICD-10-CM

## 2022-06-12 DIAGNOSIS — U099 Post covid-19 condition, unspecified: Secondary | ICD-10-CM | POA: Diagnosis not present

## 2022-06-12 DIAGNOSIS — M25512 Pain in left shoulder: Secondary | ICD-10-CM

## 2022-06-13 LAB — SEDIMENTATION RATE: Sed Rate: 60 mm/h — ABNORMAL HIGH (ref 0–30)

## 2022-06-13 LAB — C-REACTIVE PROTEIN: CRP: 20.2 mg/L — ABNORMAL HIGH (ref <8.0)

## 2022-06-13 NOTE — Progress Notes (Signed)
Results still show elevated markers for inflammation although less than 5 months ago. Sedimentation rate is 60 down from 86 and CRP is 20 down from 78. I still recommend trying to discontinue the last 1 mg daily prednisone and see if symptoms remain doing well. If symptoms increase can resume this if needed. I do recommend we schedule a follow up in about 6 months to take another look or if she notices new problems.

## 2022-06-14 ENCOUNTER — Other Ambulatory Visit: Payer: Self-pay | Admitting: Medical

## 2022-06-22 ENCOUNTER — Encounter: Payer: Self-pay | Admitting: Internal Medicine

## 2022-06-22 DIAGNOSIS — U099 Post covid-19 condition, unspecified: Secondary | ICD-10-CM

## 2022-06-22 DIAGNOSIS — M1990 Unspecified osteoarthritis, unspecified site: Secondary | ICD-10-CM

## 2022-06-24 MED ORDER — PREDNISONE 2.5 MG PO TABS: 2.5000 mg | ORAL_TABLET | Freq: Every day | ORAL | 0 refills | Status: DC

## 2022-07-08 ENCOUNTER — Other Ambulatory Visit: Payer: Self-pay

## 2022-07-10 ENCOUNTER — Ambulatory Visit: Payer: Medicare HMO | Admitting: Cardiology

## 2022-07-10 ENCOUNTER — Encounter: Payer: Self-pay | Admitting: Cardiology

## 2022-07-10 ENCOUNTER — Ambulatory Visit: Payer: Medicare HMO | Attending: Cardiology | Admitting: Cardiology

## 2022-07-10 VITALS — BP 148/66 | HR 68 | Ht <= 58 in | Wt 119.0 lb

## 2022-07-10 DIAGNOSIS — I10 Essential (primary) hypertension: Secondary | ICD-10-CM | POA: Diagnosis not present

## 2022-07-10 DIAGNOSIS — E782 Mixed hyperlipidemia: Secondary | ICD-10-CM

## 2022-07-10 NOTE — Progress Notes (Signed)
Cardiology Office Note:    Date:  07/10/2022   ID:  Leslie Ward, DOB 09-11-1930, MRN [id number]  PCP:  Mackie Pai, PA-C  Cardiologist:  Jenean Lindau, MD   Referring MD: Mackie Pai, PA-C    ASSESSMENT:    1. Primary hypertension   2. Mixed hyperlipidemia    PLAN:    In order of problems listed above:  Primary prevention stressed with the patient.  Importance of compliance with diet medication stressed and she vocalized understanding.  She was advised to ambulate to the best of her ability. Essential hypertension: Stable blood pressure.  I am against aggressive blood pressure reduction and dyslipidemia who is elderly and frail.  I believe some of her elevated blood pressure is because of whitecoat hypertension.  Mixed dyslipidemia: On lipid-lowering medications and tolerating them well.  Diet emphasized. Patient will be seen in follow-up appointment in 12 months or earlier if the patient has any concerns    Medication Adjustments/Labs and Tests Ordered: Current medicines are reviewed at length with the patient today.  Concerns regarding medicines are outlined above.  No orders of the defined types were placed in this encounter.  No orders of the defined types were placed in this encounter.    No chief complaint on file.    History of Present Illness:    Leslie Ward is a 88 y.o. female.  Patient has past medical history of essential hypertension and dyslipidemia.  She is previously unknown to me.  She has seen Dr. Bettina Gavia in the past.  She is here for routine follow-up.  She denies any chest pain orthopnea or PND.  She denies any palpitations.  She tells me that she is stable and doing well.  She is happy with her overall quality of life.  Her son is very supportive.  Patient ambulates with a cane.  At the time of my evaluation, the patient is alert awake oriented and in no distress.  Past Medical History:  Diagnosis Date   Adhesive capsulitis of left  shoulder 12/22/2018   Bilateral shoulder pain 08/24/2021   Cerebrovascular accident (CVA) ([place]) 10/25/2021   Cervical dystonia 03/08/2019   Xeomin approved diagnosis.   COVID 05/29/2021   Dystonia    Edema 02/11/2019   Ganglion cyst 08/25/2020   Glaucoma    Hyperlipidemia 02/11/2019   Hypertension    Hypothyroidism (acquired) 01/19/2019   Inflammatory arthritis 08/24/2021   Mixed incontinence 03/15/2020   Formatting of this note might be different from the original.  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  Added automatically from request for surgery [id number]   Urinary frequency 08/24/2021   Vertigo 12/01/2019    Past Surgical History:  Procedure Laterality Date   NO PAST SURGERIES      Current Medications: Current Meds  Medication Sig   amLODipine (NORVASC) 5 MG tablet Take 1 tablet (5 mg total) by mouth 2 (two) times daily.   aspirin EC (ASPIRIN LOW DOSE) 81 MG tablet TAKE ONE TABLET BY MOUTH DAILY.  DO NOT CRUSH, SWALLOW WHOLE   blood glucose meter kit and supplies Dispense based on patient and insurance preference. Use up to four times daily as directed. (FOR ICD-10 E10.9, E11.9).   Calcium Carbonate (CALCIUM 600 PO) Take 1 tablet by mouth daily.   Cholecalciferol (VITAMIN D) 125 MCG (5000 UT) CAPS Take 5,000 Units by mouth daily.   Cinnamon 500 MG capsule Take 1,000 mg by mouth daily.   ezetimibe (ZETIA) 10 MG tablet TAKE ONE TABLET BY MOUTH DAILY   hydrocortisone (ANUSOL-HC) 25 MG  suppository Place 1 suppository (25 mg total) rectally 2 (two) times daily. (Patient taking differently: Place 25 mg rectally as needed for hemorrhoids or anal itching.)   Lancets (ONETOUCH DELICA PLUS [id number]) MISC USE TO CHECK BLOOD SUGAR UP TO FOUR TIMES DAILY AS DIRECTED   latanoprost (XALATAN) 0.005 % ophthalmic solution Place 1 drop into both eyes every evening.   levothyroxine (SYNTHROID) 50 MCG tablet Take 1 tablet (50 mcg total) by mouth daily before breakfast.   Menaquinone-7 (VITAMIN K2 PO) Take 1 tablet by mouth at bedtime.   metoprolol tartrate (LOPRESSOR) 50 MG tablet TAKE ONE TABLET BY MOUTH TWICE A DAY   Omega-3 Fatty Acids (OMEGA 3 PO) Take 2 tablets by mouth daily.   Phytosterol Esters (CHOLEST CARE PO) Take 1,600 mg by mouth daily.   predniSONE (DELTASONE) 2.5 MG tablet Take 1 tablet (2.5 mg total) by mouth daily with breakfast.   Probiotic Product (PROBIOTIC DAILY PO) Take 1 tablet by mouth at bedtime.   telmisartan (MICARDIS) 20 MG tablet Take 1 tablet (20 mg total) by mouth daily.   vitamin C (ASCORBIC ACID) 500 MG tablet Take 500 mg by mouth daily.   Current Facility-Administered Medications for the 07/10/22 encounter (Office Visit) with Georgana Romain, Reita Cliche, MD  Medication   incobotulinumtoxinA (XEOMIN) 100 units injection 100 Units   incobotulinumtoxinA (XEOMIN) 100 units injection 100 Units     Allergies:   Crestor [rosuvastatin calcium]   Social History   Socioeconomic History   Marital status: Widowed    Spouse name: Not on file   Number of children: 1   Years of education: 6 or 7 years of education   Highest education level: Not on file  Occupational History   Occupation: Retired  Tobacco Use   Smoking status: Never   Smokeless tobacco: Never  Vaping Use   Vaping Use: Never used  Substance and Sexual Activity   Alcohol use: No   Drug use: No   Sexual activity: Not on file  Other Topics Concern   Not on file  Social History Narrative   Lives  alone.   Right-handed.   No caffeine use.   Social Determinants of Health   Financial Resource Strain: Low Risk  (10/01/2021)   Overall Financial Resource Strain (CARDIA)    Difficulty of Paying Living Expenses: Not hard at all  Food Insecurity: No Food Insecurity (10/01/2021)   Hunger Vital Sign    Worried About Running Out of Food in the Last Year: Never true    Ran Out of Food in the Last Year: Never true  Transportation Needs: No Transportation Needs (10/01/2021)   PRAPARE - Hydrologist (Medical): No    Lack of Transportation (Non-Medical): No  Physical Activity: Inactive (10/01/2021)   Exercise Vital Sign    Days of Exercise per Week: 0 days    Minutes of Exercise per Session: 0 min  Stress: No Stress Concern Present (10/01/2021)  [place] Stress Questionnaire    Feeling of Stress : Not at all  Social Connections: Not on file     Family History: The patient's family history includes Heart Problems in her brother; Heart attack in her father; Stroke in her mother.  ROS:   Please see the history of present illness.    All other systems reviewed and are negative.  EKGs/Labs/Other Studies Reviewed:    The following studies were reviewed today: I discussed my findings with the patient at length.   Recent Labs: 10/10/2021: Hemoglobin 12.7; Platelets 254; TSH 5.541 12/25/2021: ALT 12; BUN 16; Creatinine, Ser 0.91; Potassium 4.0; Sodium 138  Recent Lipid Panel    Component Value Date/Time   CHOL 225 (H) 10/11/2021 1107   CHOL 275 (H) 11/09/2020 1101   TRIG 129 10/11/2021 1107   HDL 72 10/11/2021 1107   HDL 67 11/09/2020 1101   CHOLHDL 3.1 10/11/2021 1107   VLDL 26 10/11/2021 1107   LDLCALC 127 (H) 10/11/2021 1107   LDLCALC 188 (H) 11/09/2020 1101    Physical Exam:    VS:  BP (!) 148/66   Pulse 68   Ht _0  (1.448 m)   Wt 119 lb (54 kg)   SpO2 95%   BMI 25.75 kg/m     Wt Readings from  Last 3 Encounters:  07/10/22 119 lb (54 kg)  06/12/22 118 lb 12.8 oz (53.9 kg)  05/01/22 119 lb (54 kg)     GEN: Patient is in no acute distress HEENT: Normal NECK: No JVD; No carotid bruits LYMPHATICS: No lymphadenopathy CARDIAC: Hear sounds regular, 2/6 systolic murmur at the apex. RESPIRATORY:  Clear to auscultation without rales, wheezing or rhonchi  ABDOMEN: Soft, non-tender, non-distended MUSCULOSKELETAL:  No edema; No deformity  SKIN: Warm and dry NEUROLOGIC:  Alert and oriented x 3 PSYCHIATRIC:  Normal affect   Signed, Jenean Lindau, MD  07/10/2022 2:52 PM    [facility]

## 2022-07-10 NOTE — Patient Instructions (Signed)

## 2022-07-15 ENCOUNTER — Encounter: Payer: Self-pay | Admitting: Medical

## 2022-07-17 ENCOUNTER — Encounter: Payer: Self-pay | Admitting: Medical

## 2022-07-17 ENCOUNTER — Ambulatory Visit (INDEPENDENT_AMBULATORY_CARE_PROVIDER_SITE_OTHER): Payer: Medicare HMO | Admitting: Medical

## 2022-07-17 VITALS — BP 140/72 | HR 73 | Resp 18 | Ht <= 58 in | Wt 120.6 lb

## 2022-07-17 DIAGNOSIS — E785 Hyperlipidemia, unspecified: Secondary | ICD-10-CM

## 2022-07-17 DIAGNOSIS — K649 Unspecified hemorrhoids: Secondary | ICD-10-CM | POA: Diagnosis not present

## 2022-07-17 DIAGNOSIS — T148XXA Other injury of unspecified body region, initial encounter: Secondary | ICD-10-CM

## 2022-07-17 DIAGNOSIS — R197 Diarrhea, unspecified: Secondary | ICD-10-CM

## 2022-07-17 DIAGNOSIS — K921 Melena: Secondary | ICD-10-CM

## 2022-07-17 DIAGNOSIS — R195 Other fecal abnormalities: Secondary | ICD-10-CM | POA: Diagnosis not present

## 2022-07-17 DIAGNOSIS — I10 Essential (primary) hypertension: Secondary | ICD-10-CM | POA: Diagnosis not present

## 2022-07-17 LAB — CBC WITH DIFFERENTIAL/PLATELET
Basophils Absolute: 0.1 10*3/uL (ref 0.0–0.1)
Basophils Relative: 0.4 % (ref 0.0–3.0)
Eosinophils Absolute: 0.2 10*3/uL (ref 0.0–0.7)
Eosinophils Relative: 1.3 % (ref 0.0–5.0)
HCT: 36.4 % (ref 36.0–46.0)
Hemoglobin: 12 g/dL (ref 12.0–15.0)
Lymphocytes Relative: 36.1 % (ref 12.0–46.0)
Lymphs Abs: 4.6 10*3/uL — ABNORMAL HIGH (ref 0.7–4.0)
MCHC: 32.9 g/dL (ref 30.0–36.0)
MCV: 88.5 fl (ref 78.0–100.0)
Monocytes Absolute: 0.6 10*3/uL (ref 0.1–1.0)
Monocytes Relative: 5 % (ref 3.0–12.0)
Neutro Abs: 7.2 10*3/uL (ref 1.4–7.7)
Neutrophils Relative %: 57.2 % (ref 43.0–77.0)
Platelets: 324 10*3/uL (ref 150.0–400.0)
RBC: 4.12 Mil/uL (ref 3.87–5.11)
RDW: 16 % — ABNORMAL HIGH (ref 11.5–15.5)
WBC: 12.7 10*3/uL — ABNORMAL HIGH (ref 4.0–10.5)

## 2022-07-17 LAB — COMPREHENSIVE METABOLIC PANEL
ALT: 10 U/L (ref 0–35)
AST: 12 U/L (ref 0–37)
Albumin: 3.9 g/dL (ref 3.5–5.2)
Alkaline Phosphatase: 55 U/L (ref 39–117)
BUN: 13 mg/dL (ref 6–23)
CO2: 29 mEq/L (ref 19–32)
Calcium: 9.1 mg/dL (ref 8.4–10.5)
Chloride: 102 mEq/L (ref 96–112)
Creatinine, Ser: 0.83 mg/dL (ref 0.40–1.20)
GFR: 62.34 mL/min (ref >60.00)
Glucose, Bld: 93 mg/dL (ref 70–99)
Potassium: 3.9 mEq/L (ref 3.5–5.1)
Sodium: 138 mEq/L (ref 135–145)
Total Bilirubin: 0.8 mg/dL (ref 0.2–1.2)
Total Protein: 6.9 g/dL (ref 6.0–8.3)

## 2022-07-17 LAB — LIPID PANEL
Cholesterol: 215 mg/dL — ABNORMAL HIGH (ref 0–200)
HDL: 78.4 mg/dL (ref >39.00)
LDL Cholesterol: 114 mg/dL — ABNORMAL HIGH (ref 0–99)
NonHDL: 136.94
Total CHOL/HDL Ratio: 3
Triglycerides: 115 mg/dL (ref 0.0–149.0)
VLDL: 23 mg/dL (ref 0.0–40.0)

## 2022-07-17 MED ORDER — HYDROCORTISONE ACETATE 25 MG RE SUPP: 25.0000 mg | Freq: Two times a day (BID) | RECTAL | 0 refills | Status: DC

## 2022-07-17 NOTE — Patient Instructions (Addendum)
1. Loose stools/diarrhea Appears resolved now. Would not use any immodium. Recommend healthy diet adequate fiber. If diarrhea returns then turn in below panel. - Stool Culture; Future - Clostridium Difficile by PCR; Future - Ova and parasite examination; Future  2. Hypertension, unspecified type Telmisartan and amlodipine continue. Recheck at home.   3. Hemorrhoids, unspecified hemorrhoid type Use prep H. If not adequate then use anusol HC.  4. Bruise Cbc today.   5. Hyperlipidemia, unspecified hyperlipidemia type Cmp and lipid panel today - Comp Met (CMET)  6. Blood in stool I think recent bright red blood from hemorrhoid. Would treat and turn in stool  screen test onlyl if needed later for recurrent possible blood. If were to reoccur without associated hemorrhoid. - POC Hemoccult Bld/Stl (3-Cd Home Screen); Future   Follow up 2-3 weeks or sooner if needed.

## 2022-07-17 NOTE — Progress Notes (Signed)
Subjective:    Patient ID: Leslie Ward, female    DOB: 06/18/1931, 88 y.o.   MRN: [id number]  HPI  Pt in for evaluation.   Pt has acute onset of diarrhea on Sunday. She called son on Sunday and she explained stools looked bright red. Son states early in week some red  tinged appearance to loose stool.  Pt has hard time expressing/explaining type of diarrhea.    No fever, no chills or sweats.  Son advised imodium on Saturday  and diarrhea resolved.  Normal stool pattern 2 bms a day.   Slight itchy rectum. Preparation h did help/resolve itch.   Review of Systems  Constitutional:  Negative for chills, fatigue and fever.  Respiratory:  Negative for cough, chest tightness, shortness of breath and wheezing.   Cardiovascular:  Negative for chest pain.  Gastrointestinal:  Positive for blood in stool and diarrhea. Negative for abdominal distention, abdominal pain, constipation, nausea, rectal pain and vomiting.  Genitourinary:  Negative for dyspareunia and enuresis.  Musculoskeletal:  Negative for back pain.    Past Medical History:  Diagnosis Date   Adhesive capsulitis of left shoulder 12/22/2018   Bilateral shoulder pain 08/24/2021   Cerebrovascular accident (CVA) ([place]) 10/25/2021   Cervical dystonia 03/08/2019   Xeomin approved diagnosis.   COVID 05/29/2021   Dystonia    Edema 02/11/2019   Ganglion cyst 08/25/2020   Glaucoma    Hyperlipidemia 02/11/2019   Hypertension    Hypothyroidism (acquired) 01/19/2019   Inflammatory arthritis 08/24/2021   Mixed incontinence 03/15/2020   Formatting of this note might be different from the original.  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  Added automatically from request for surgery [id number]   Urinary frequency 08/24/2021   Vertigo 12/01/2019     Social History   Socioeconomic History   Marital status: Widowed    Spouse name: Not on file   Number of children: 1   Years of education: 6 or 7 years of education   Highest education level: Not on file  Occupational History   Occupation: Retired  Tobacco Use   Smoking status: Never   Smokeless tobacco: Never  Vaping Use   Vaping Use: Never used  Substance and Sexual Activity   Alcohol use: No   Drug use: No   Sexual activity: Not on file  Other Topics Concern   Not on file  Social History Narrative   Lives alone.   Right-handed.   No caffeine use.   Social Determinants of Health   Financial Resource Strain: Low Risk  (10/01/2021)   Overall Financial Resource Strain (CARDIA)    Difficulty of Paying Living Expenses: Not hard at all  Food Insecurity: No Food Insecurity (10/01/2021)   Hunger Vital Sign    Worried About Running Out of Food in the Last Year: Never true    Ran Out of Food in the Last Year: Never true  Transportation Needs: No Transportation Needs (10/01/2021)   PRAPARE - Transportation    Lack  of Transportation (Medical): No    Lack of Transportation (Non-Medical): No  Physical Activity: Inactive (10/01/2021)   Exercise Vital Sign    Days of Exercise per Week: 0 days    Minutes of Exercise per Session: 0 min  Stress: No Stress Concern Present (10/01/2021)   [place]    Feeling of Stress : Not at all  Social Connections: Not on file  Intimate Partner Violence: Not on file    Past Surgical History:  Procedure Laterality Date   NO PAST SURGERIES      Family History  Problem  Relation Age of Onset   Stroke Mother    Heart attack Father    Heart Problems Brother     Allergies  Allergen Reactions   Crestor [Rosuvastatin Calcium] Other (See Comments)    "caused me to go into kidney failure"    Current Outpatient Medications on File Prior to Visit  Medication Sig Dispense Refill   amLODipine (NORVASC) 5 MG tablet Take 1 tablet (5 mg total) by mouth 2 (two) times daily. 180 tablet 1   aspirin EC (ASPIRIN LOW DOSE) 81 MG tablet TAKE ONE TABLET BY MOUTH DAILY. DO NOT CRUSH, SWALLOW WHOLE 90 tablet 1   blood glucose meter kit and supplies Dispense based on patient and insurance preference. Use up to four times daily as directed. (FOR ICD-10 E10.9, E11.9). 1 each 0   Calcium Carbonate (CALCIUM 600 PO) Take 1 tablet by mouth daily.     Cholecalciferol (VITAMIN D) 125 MCG (5000 UT) CAPS Take 5,000 Units by mouth daily.     Cinnamon 500 MG capsule Take 1,000 mg by mouth daily.     ezetimibe (ZETIA) 10 MG tablet TAKE ONE TABLET BY MOUTH DAILY 90 tablet 1   hydrocortisone (ANUSOL-HC) 25 MG suppository Place 1 suppository (25 mg total) rectally 2 (two) times daily. (Patient taking differently: Place 25 mg rectally as needed for hemorrhoids or anal itching.) 14 suppository 0   Lancets (ONETOUCH DELICA PLUS [id number]) MISC USE TO CHECK BLOOD SUGAR UP TO FOUR TIMES DAILY AS DIRECTED 100 each 1   latanoprost (XALATAN) 0.005 % ophthalmic solution Place 1 drop into both eyes every evening.     levothyroxine (SYNTHROID) 50 MCG tablet Take 1 tablet (50 mcg total) by mouth daily before breakfast. 90 tablet 0   Menaquinone-7 (VITAMIN K2 PO) Take 1 tablet by mouth at bedtime.     metoprolol tartrate (LOPRESSOR) 50 MG tablet TAKE ONE TABLET BY MOUTH TWICE A DAY 180 tablet 1   Omega-3 Fatty Acids (OMEGA 3 PO) Take 2 tablets by mouth daily.     Phytosterol Esters (CHOLEST CARE PO) Take 1,600 mg by mouth daily.     predniSONE (DELTASONE) 2.5 MG tablet Take 1 tablet (2.5 mg total) by mouth  daily with breakfast. 30 tablet 0   Probiotic Product (PROBIOTIC DAILY PO) Take 1 tablet by mouth at bedtime.     telmisartan (MICARDIS) 20 MG tablet Take 1 tablet (20 mg total) by mouth daily. 90 tablet 1   vitamin C (ASCORBIC ACID) 500 MG tablet Take 500 mg by mouth daily.     citalopram (CELEXA) 10 MG tablet Take 1 tablet (10 mg total) by mouth daily. (Patient not taking: Reported on 06/12/2022) 30 tablet 11   predniSONE (DELTASONE) 1 MG tablet Take 2 tablets (2 mg total) by mouth daily with breakfast. (Patient not taking: Reported on 07/10/2022) 180 tablet 0   Current  Facility-Administered Medications on File Prior to Visit  Medication Dose Route Frequency Provider Last Rate Last Admin   incobotulinumtoxinA (XEOMIN) 100 units injection 100 Units  100 Units Intramuscular Q90 days Marcial Pacas, MD       incobotulinumtoxinA [facility]) 100 units injection 100 Units  100 Units Intramuscular Q90 days Marcial Pacas, MD   100 Units at 05/03/22 2307    BP (!) 140/72   Pulse 73   Resp 18   Ht _0  (1.448 m)   Wt 120 lb 9.6 oz (54.7 kg)   SpO2 98%   BMI 26.10 kg/m          Objective:   Physical Exam   General- No acute distress. Pleasant patient. Neck- Full range of motion, no jvd Lungs- Clear, even and unlabored. Heart- regular rate and rhythm. Neurologic- CNII- XII grossly intact.  Abdomen- soft, nontender, non distended, +bs, no rebound or guarding. Rectum- small /tiny non thrombosed hemoroid at 3 oclock position. Back, no cva tenderness.    Assessment & Plan:   Patient Instructions  1. Loose stools/diarrha Appears resolved now. Would not use any immodium. Recommend healthy diet adequate fiber. If diarrhea returns then turn in below panel. - Stool Culture; Future - Clostridium Difficile by PCR; Future - Ova and parasite examination; Future  2. Hypertension, unspecified type Telmisartan and amlodipine continue. Recheck at home.   3. Hemorrhoids, unspecified hemorrhoid type Use  prep H. If not adequate then use anusol HC.  4. Bruise Cbc today.   5. Hyperlipidemia, unspecified hyperlipidemia type Cmp and lipid panel today - Comp Met (CMET)  6. Blood in stool I think recent bright red blood from hemorrhoid. Would treat and turn in stool  screen test onlyl if needed later for recurrent possible blood. If were to reoccur without associated hemorrhoid. - POC Hemoccult Bld/Stl (3-Cd Home Screen); Future   Follow up 2-3 weeks or sooner if needed.   Mackie Pai, PA-C

## 2022-07-22 ENCOUNTER — Encounter: Payer: Self-pay | Admitting: Cardiology

## 2022-07-22 ENCOUNTER — Encounter: Payer: Self-pay | Admitting: Medical

## 2022-07-22 ENCOUNTER — Other Ambulatory Visit: Payer: Self-pay | Admitting: Internal Medicine

## 2022-07-22 DIAGNOSIS — M1990 Unspecified osteoarthritis, unspecified site: Secondary | ICD-10-CM

## 2022-07-22 NOTE — Telephone Encounter (Signed)
Next Visit: 12/18/2022  Last Visit: 06/12/2022  Last Fill: 06/24/2022  DX: Inflammatory arthritis    Current Dose per mychart message on 06/22/2022: new prescription for 2.5 mg tablet she can go up to 2.5 for a week, if still no improvement can try taking 5 mg.   Okay to refill prednisone?

## 2022-07-23 MED ORDER — PREDNISONE 2.5 MG PO TABS: 2.5000 mg | ORAL_TABLET | Freq: Every day | ORAL | 1 refills | Status: DC

## 2022-07-23 NOTE — Telephone Encounter (Signed)
Attempted to contact the patient and left message with patient's sone to call and verify how patient is taking Prednisone.

## 2022-07-23 NOTE — Telephone Encounter (Signed)
Can we check how she is taking this currently and so I can reorder accordingly?

## 2022-07-23 NOTE — Telephone Encounter (Signed)
Spoke to patient's son, [name], who confirmed patient is taking 2.5 mg daily.

## 2022-07-24 NOTE — Telephone Encounter (Signed)
Discussed with pt in office at his appointment.

## 2022-07-31 ENCOUNTER — Ambulatory Visit: Payer: Medicare HMO | Admitting: Neurology

## 2022-08-02 ENCOUNTER — Encounter: Payer: Self-pay | Admitting: Medical

## 2022-08-02 ENCOUNTER — Other Ambulatory Visit: Payer: Self-pay | Admitting: Medical

## 2022-08-06 NOTE — Addendum Note (Signed)
Addended by: Gwenevere Abbot on: 08/06/2022 02:17 PM   Modules accepted: Orders

## 2022-08-15 ENCOUNTER — Other Ambulatory Visit: Payer: Self-pay | Admitting: Cardiology

## 2022-08-15 NOTE — Telephone Encounter (Signed)
Rx refill sent to pharmacy. 

## 2022-09-04 ENCOUNTER — Ambulatory Visit: Payer: Medicare HMO | Admitting: Neurology

## 2022-09-04 ENCOUNTER — Encounter: Payer: Self-pay | Admitting: Neurology

## 2022-09-25 DIAGNOSIS — H40021 Open angle with borderline findings, high risk, right eye: Secondary | ICD-10-CM | POA: Diagnosis not present

## 2022-09-25 DIAGNOSIS — H5203 Hypermetropia, bilateral: Secondary | ICD-10-CM | POA: Diagnosis not present

## 2022-09-25 DIAGNOSIS — H52203 Unspecified astigmatism, bilateral: Secondary | ICD-10-CM | POA: Diagnosis not present

## 2022-09-25 DIAGNOSIS — Z961 Presence of intraocular lens: Secondary | ICD-10-CM | POA: Diagnosis not present

## 2022-09-25 DIAGNOSIS — H35033 Hypertensive retinopathy, bilateral: Secondary | ICD-10-CM | POA: Diagnosis not present

## 2022-09-25 DIAGNOSIS — H353131 Nonexudative age-related macular degeneration, bilateral, early dry stage: Secondary | ICD-10-CM | POA: Diagnosis not present

## 2022-09-25 DIAGNOSIS — H35363 Drusen (degenerative) of macula, bilateral: Secondary | ICD-10-CM | POA: Diagnosis not present

## 2022-09-25 DIAGNOSIS — H02831 Dermatochalasis of right upper eyelid: Secondary | ICD-10-CM | POA: Diagnosis not present

## 2022-09-25 DIAGNOSIS — H524 Presbyopia: Secondary | ICD-10-CM | POA: Diagnosis not present

## 2022-09-25 DIAGNOSIS — H401122 Primary open-angle glaucoma, left eye, moderate stage: Secondary | ICD-10-CM | POA: Diagnosis not present

## 2022-09-25 DIAGNOSIS — H43393 Other vitreous opacities, bilateral: Secondary | ICD-10-CM | POA: Diagnosis not present

## 2022-09-25 DIAGNOSIS — H02834 Dermatochalasis of left upper eyelid: Secondary | ICD-10-CM | POA: Diagnosis not present

## 2022-09-26 ENCOUNTER — Ambulatory Visit: Payer: Medicare HMO | Admitting: Neurology

## 2022-10-07 ENCOUNTER — Telehealth: Payer: Self-pay

## 2022-10-07 NOTE — Telephone Encounter (Signed)
I spoke with Leslie Ward's son regarding her Xeomin Inj originally scheduled for Wednesday 2/28. He advised that he would need to r/s due to a conflict with his schedule. She is now scheduled for November 21, 2022. I wanted to see if anything needed to be done as the last inj she received was September 2023. Thanks!

## 2022-10-09 ENCOUNTER — Ambulatory Visit: Payer: Medicare HMO | Admitting: Neurology

## 2022-10-11 ENCOUNTER — Other Ambulatory Visit: Payer: Self-pay | Admitting: Medical

## 2022-10-11 MED ORDER — HYDROCORTISONE ACETATE 25 MG RE SUPP: 25.0000 mg | Freq: Two times a day (BID) | RECTAL | 0 refills | Status: DC

## 2022-10-23 ENCOUNTER — Ambulatory Visit: Payer: Medicare HMO | Admitting: Neurology

## 2022-10-30 ENCOUNTER — Ambulatory Visit: Payer: Medicare HMO | Admitting: Neurology

## 2022-10-31 ENCOUNTER — Other Ambulatory Visit: Payer: Self-pay | Admitting: Cardiology

## 2022-11-06 ENCOUNTER — Ambulatory Visit: Payer: Medicare HMO | Admitting: Medical

## 2022-11-11 ENCOUNTER — Encounter: Payer: Self-pay | Admitting: Medical

## 2022-11-11 ENCOUNTER — Ambulatory Visit (INDEPENDENT_AMBULATORY_CARE_PROVIDER_SITE_OTHER): Payer: Medicare HMO | Admitting: Medical

## 2022-11-11 VITALS — BP 121/44 | HR 66 | Ht <= 58 in | Wt 120.0 lb

## 2022-11-11 DIAGNOSIS — K649 Unspecified hemorrhoids: Secondary | ICD-10-CM | POA: Diagnosis not present

## 2022-11-11 MED ORDER — HYDROCORTISONE ACETATE 25 MG RE SUPP: 25.0000 mg | Freq: Two times a day (BID) | RECTAL | 0 refills | Status: DC

## 2022-11-11 NOTE — Patient Instructions (Addendum)
On exam appears to have one thrombosed hemorrhoid recently not responding to annusol hc suppository.   Can try sitz bath twice daily and continue annusol hc suppository. A Add on recticare.  I placed referral to GI MD and asking them to expedite appointment if possible in light of current circumstances. Would ask that you call later today and update me on response by tomorrow morning.   Follow up in 2 weeks or sooner if needed

## 2022-11-11 NOTE — Progress Notes (Signed)
Subjective:    Patient ID: Leslie Ward, female    DOB: 21-Dec-1930, 89 y.o.   MRN: [id number]  HPI  Pt in with rectal pain and itching.  Pt had symptoms for one week. Pt has tried preperation h and other over time topical medication back in December.   Pt denies constipation. Hurts for her to have bm. She states very itchy. Sometimes blood present on paper when wipes.  Back in August 06, 2022 got message from pt . I placed referral but pt family states her appointment is in May.  Apears around that time did responds to annusol hc supp so family never pressed for referral at that time since she had gotten better.  Back in 07/17/2022 office note exam below  Exam on that day showed Rectum- small /tiny non thrombosed hemoroid at 3 oclock position.   Back in march about one month ago looks rx anusol filled again.  Last week pt has not responded to annusol hc supp and having some severe pain at times.      Review of Systems  Constitutional:  Negative for chills, fatigue and fever.  Respiratory:  Negative for cough, chest tightness, shortness of breath and wheezing.   Cardiovascular:  Negative for chest pain and palpitations.  Gastrointestinal:  Negative for abdominal distention, abdominal pain, anal bleeding, constipation and diarrhea.       Bright red blood when pt wipes. Severe pain with bm  Musculoskeletal:  Negative for back pain.  Neurological:  Negative for dizziness and headaches.  Hematological:  Negative for adenopathy. Does not bruise/bleed easily.  Psychiatric/Behavioral:  Negative for behavioral problems and confusion.     Past Medical History:  Diagnosis Date   Adhesive capsulitis of left shoulder 12/22/2018   Bilateral shoulder pain 08/24/2021   Cerebrovascular accident (CVA) ([place]) 10/25/2021   Cervical dystonia 03/08/2019   Xeomin approved diagnosis.   COVID 05/29/2021   Dystonia    Edema 02/11/2019   Ganglion cyst 08/25/2020   Glaucoma     Hyperlipidemia 02/11/2019   Hypertension    Hypothyroidism (acquired) 01/19/2019   Inflammatory arthritis 08/24/2021   Mixed incontinence 03/15/2020   Formatting of this note might be different from the original.  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  Added automatically from request for surgery [id number]   Urinary frequency 08/24/2021   Vertigo 12/01/2019     Social History   Socioeconomic History   Marital status: Widowed    Spouse name: Not on file   Number of children: 1   Years of education: 6 or 7 years of education   Highest education level: Not on file  Occupational History   Occupation: Retired  Tobacco Use   Smoking status: Never   Smokeless tobacco: Never  Vaping Use   Vaping Use: Never used  Substance and Sexual Activity   Alcohol use: No   Drug use: No   Sexual activity: Not on file  Other Topics Concern   Not on file  Social History Narrative  Lives alone.   Right-handed.   No caffeine use.   Social Determinants of Health   Financial Resource Strain: Low Risk  (10/01/2021)   Overall Financial Resource Strain (CARDIA)    Difficulty of Paying Living Expenses: Not hard at all  Food Insecurity: No Food Insecurity (10/01/2021)   Hunger Vital Sign    Worried About Running Out of Food in the Last Year: Never true    Ran Out of Food in the Last Year: Never true  Transportation Needs: No Transportation Needs (10/01/2021)   PRAPARE - Hydrologist  (Medical): No    Lack of Transportation (Non-Medical): No  Physical Activity: Inactive (10/01/2021)   Exercise Vital Sign    Days of Exercise per Week: 0 days    Minutes of Exercise per Session: 0 min  Stress: No Stress Concern Present (10/01/2021)   [place]    Feeling of Stress : Not at all  Social Connections: Not on file  Intimate Partner Violence: Not on file    Past Surgical History:  Procedure Laterality Date   NO PAST SURGERIES      Family History  Problem Relation Age of Onset   Stroke Mother    Heart attack Father    Heart Problems Brother     Allergies  Allergen Reactions   Crestor [Rosuvastatin Calcium] Other (See Comments)    "caused me to go into kidney failure"    Current Outpatient Medications on File Prior to Visit  Medication Sig Dispense Refill   amLODipine (NORVASC) 5 MG tablet Take 1 tablet (5 mg total) by mouth 2 (two) times daily. 180 tablet 1   aspirin EC (ASPIRIN LOW DOSE) 81 MG tablet Take 1 tablet (81 mg total) by mouth daily. 90 tablet 2   blood glucose meter kit and supplies Dispense based on patient and insurance preference. Use up to four times daily as directed. (FOR ICD-10 E10.9, E11.9). 1 each 0   Calcium Carbonate (CALCIUM 600 PO) Take 1 tablet by mouth daily.     Cholecalciferol (VITAMIN D) 125 MCG (5000 UT) CAPS Take 5,000 Units by mouth daily.     Cinnamon 500 MG capsule Take 1,000 mg by mouth daily.     ezetimibe (ZETIA) 10 MG tablet TAKE 1 TABLET BY MOUTH DAILY 90 tablet 2   hydrocortisone (ANUSOL-HC) 25 MG suppository Place 1 suppository (25 mg total) rectally 2 (two) times daily. (Patient taking differently: Place 25 mg rectally as needed for hemorrhoids or anal itching.) 14 suppository 0   hydrocortisone (ANUSOL-HC) 25 MG suppository Place 1 suppository (25 mg total) rectally 2 (two) times daily. 12 suppository 0   Lancets (ONETOUCH DELICA PLUS 123XX123) MISC USE TO  CHECK BLOOD SUGAR UP TO FOUR TIMES DAILY AS DIRECTED 100 each 1   latanoprost (XALATAN) 0.005 % ophthalmic solution Place 1 drop into both eyes every evening.     levothyroxine (SYNTHROID) 50 MCG tablet Take 1 tablet (50 mcg total) by mouth daily before breakfast. 90 tablet 1   Menaquinone-7 (VITAMIN K2 PO) Take 1 tablet by mouth at bedtime.     metoprolol tartrate (LOPRESSOR) 50 MG tablet TAKE ONE TABLET BY MOUTH TWICE A DAY 180 tablet 1   Omega-3 Fatty Acids (OMEGA 3 PO) Take 2 tablets by mouth daily.     Phytosterol Esters (CHOLEST CARE PO) Take 1,600 mg by mouth daily.     predniSONE (DELTASONE) 2.5 MG tablet Take 1  tablet (2.5 mg total) by mouth daily with breakfast. 90 tablet 1   Probiotic Product (PROBIOTIC DAILY PO) Take 1 tablet by mouth at bedtime.     telmisartan (MICARDIS) 20 MG tablet Take 1 tablet (20 mg total) by mouth daily. 90 tablet 1   vitamin C (ASCORBIC ACID) 500 MG tablet Take 500 mg by mouth daily.     Current Facility-Administered Medications on File Prior to Visit  Medication Dose Route Frequency Provider Last Rate Last Admin   incobotulinumtoxinA (XEOMIN) 100 units injection 100 Units  100 Units Intramuscular Q90 days Marcial Pacas, MD       incobotulinumtoxinA [facility]) 100 units injection 100 Units  100 Units Intramuscular Q90 days Marcial Pacas, MD   100 Units at 05/03/22 2307    BP (!) 121/44   Pulse 66   Ht 4\' 9"  (1.448 m)   Wt 120 lb (54.4 kg)   SpO2 97%   BMI 25.97 kg/m        Objective:   Physical Exam  General- No acute distress. Pleasant patient. Neck- Full range of motion, no jvd Lungs- Clear, even and unlabored. Heart- regular rate and rhythm. Neurologic- CNII- XII grossly intact.  Abdomen- soft, nontender, non distended, +bs, no rebound or guarding. Rectum- moderate large thrombosed hemorrhoid in 8 oclock positoin(pt laying on left side). One smaller hermorrhoid adjacent. Back, no cva tenderness.       Assessment & Plan:   On exam appears  to have one thrombosed hemorrhoid recently not responding to annusol hc suppository.   Can try sitz bath twice daily and continue annusol hc suppository. A Add on recticare.  I placed referral to GI MD and asking them to expedite appointment if possible in light of current circumstances. Would ask that you call later today and update me on response by tomorrow morning.   Follow up in 2 weeks or sooner if needed   [employer], PA-C

## 2022-11-14 ENCOUNTER — Ambulatory Visit (INDEPENDENT_AMBULATORY_CARE_PROVIDER_SITE_OTHER): Payer: Medicare HMO | Admitting: Physician Assistant

## 2022-11-14 ENCOUNTER — Encounter: Payer: Self-pay | Admitting: Physician Assistant

## 2022-11-14 VITALS — BP 130/68 | HR 72 | Ht <= 58 in | Wt 122.0 lb

## 2022-11-14 DIAGNOSIS — K641 Second degree hemorrhoids: Secondary | ICD-10-CM | POA: Diagnosis not present

## 2022-11-14 DIAGNOSIS — L22 Diaper dermatitis: Secondary | ICD-10-CM

## 2022-11-14 DIAGNOSIS — L309 Dermatitis, unspecified: Secondary | ICD-10-CM

## 2022-11-14 DIAGNOSIS — K6289 Other specified diseases of anus and rectum: Secondary | ICD-10-CM

## 2022-11-14 NOTE — Progress Notes (Signed)
Chief Complaint: Rectal itching  HPI:    Leslie Ward is a 89 year old female from [place], who presents to clinic today with her son who acts somewhat as her interpreter and caretaker, who was referred to me by Mackie Pai, PA-C for a complaint of rectal itching.      11/11/2022 seen by PCP and at that time discussed some rectal pain and itching for the past week, tried Preparation H for a while, but it was not really helping.  It also tried Anusol suppositories which worked here and there.  At time of exam in December patient had a small tiny nonthrombosed hemorrhoid.    Today, the patient presents to clinic accompanied by her son.  He explains that it seems like about 2 days out of the month over the past couple of years she will have trouble with "hemorrhoids", and then it will go away.  Apparently they use a combination of Hydrocortisone ointment and suppositories which seems to work for the most part but most recently she has had a lot of discomfort that is not relieved with the Hydrocortisone suppositories she has been using twice a day for the past 4 days.  Patient tells me it itches a lot back there.  Denies pain with a bowel movement.  Tells me she is not constipated and does not have diarrhea, she passes a soft stool.  She does use a bidet at home per her son.    Per patient prior screening colonoscopy "years ago", which was normal for her.  She is not exactly sure when or who did this.    Denies fever, chills, weight loss, nausea, vomiting or abdominal pain.  Past Medical History:  Diagnosis Date   Adhesive capsulitis of left shoulder 12/22/2018   Bilateral shoulder pain 08/24/2021   Cerebrovascular accident (CVA) 10/25/2021   Cervical dystonia 03/08/2019   Xeomin approved diagnosis.   COVID 05/29/2021   Dystonia    Edema 02/11/2019   Ganglion cyst 08/25/2020   Glaucoma    Hyperlipidemia 02/11/2019   Hypertension    Hypothyroidism (acquired) 01/19/2019    Inflammatory arthritis 08/24/2021   Mixed incontinence 03/15/2020   Formatting of this note might be different from the original.  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  Added automatically from request for surgery [id number]   Urinary frequency 08/24/2021   Vertigo 12/01/2019    Past Surgical History:  Procedure Laterality Date   NO PAST SURGERIES      Current Outpatient Medications  Medication Sig Dispense Refill   amLODipine (NORVASC) 5 MG tablet Take 1 tablet (5 mg total) by mouth 2 (two) times daily. 180 tablet 1   aspirin EC (ASPIRIN LOW DOSE) 81 MG tablet Take 1 tablet (81 mg total) by mouth daily. 90 tablet 2   blood glucose meter kit and supplies Dispense based on patient and insurance preference. Use up to four times daily as directed. (FOR ICD-10 E10.9, E11.9). 1 each 0   Calcium Carbonate (CALCIUM 600 PO) Take 1 tablet  by mouth daily.     Cholecalciferol (VITAMIN D) 125 MCG (5000 UT) CAPS Take 5,000 Units by mouth daily.     Cinnamon 500 MG capsule Take 1,000 mg by mouth daily.     ezetimibe (ZETIA) 10 MG tablet TAKE 1 TABLET BY MOUTH DAILY 90 tablet 2   hydrocortisone (ANUSOL-HC) 25 MG suppository Place 1 suppository (25 mg total) rectally 2 (two) times daily. (Patient taking differently: Place 25 mg rectally as needed for hemorrhoids or anal itching.) 14 suppository 0   hydrocortisone (ANUSOL-HC) 25 MG suppository Place 1 suppository (25 mg total) rectally 2 (two) times daily. 12 suppository 0   Lancets  (ONETOUCH DELICA PLUS 123XX123) MISC USE TO CHECK BLOOD SUGAR UP TO FOUR TIMES DAILY AS DIRECTED 100 each 1   latanoprost (XALATAN) 0.005 % ophthalmic solution Place 1 drop into both eyes every evening.     levothyroxine (SYNTHROID) 50 MCG tablet Take 1 tablet (50 mcg total) by mouth daily before breakfast. 90 tablet 1   Menaquinone-7 (VITAMIN K2 PO) Take 1 tablet by mouth at bedtime.     metoprolol tartrate (LOPRESSOR) 50 MG tablet TAKE ONE TABLET BY MOUTH TWICE A DAY 180 tablet 1   Omega-3 Fatty Acids (OMEGA 3 PO) Take 2 tablets by mouth daily.     Phytosterol Esters (CHOLEST CARE PO) Take 1,600 mg by mouth daily.     predniSONE (DELTASONE) 2.5 MG tablet Take 1 tablet (2.5 mg total) by mouth daily with breakfast. 90 tablet 1   Probiotic Product (PROBIOTIC DAILY PO) Take 1 tablet by mouth at bedtime.     telmisartan (MICARDIS) 20 MG tablet Take 1 tablet (20 mg total) by mouth daily. 90 tablet 1   vitamin C (ASCORBIC ACID) 500 MG tablet Take 500 mg by mouth daily.     Current Facility-Administered Medications  Medication Dose Route Frequency Provider Last Rate Last Admin   incobotulinumtoxinA (XEOMIN) 100 units injection 100 Units  100 Units Intramuscular Q90 days Marcial Pacas, MD       incobotulinumtoxinA [employer] [facility]) 100 units injection 100 Units  100 Units Intramuscular Q90 days Marcial Pacas, MD   100 Units at 05/03/22 2307    Allergies as of 11/14/2022 - Review Complete 11/14/2022  Allergen Reaction Noted   Crestor [rosuvastatin calcium] Other (See Comments) 05/11/2019    Family History  Problem Relation Age of Onset   Stroke Mother    Heart attack Father    Heart Problems Brother    High blood pressure Maternal Grandmother    Liver disease Neg Hx    Colon cancer Neg Hx    Esophageal cancer Neg Hx     Social History   Socioeconomic History   Marital status: Widowed    Spouse name: Not on file   Number of children: 2   Years of education: 6 or 7 years of education   Highest  education level: Not on file  Occupational History   Occupation: Retired  Tobacco Use   Smoking status: Never   Smokeless tobacco: Never  Vaping Use   Vaping Use: Never used  Substance and Sexual Activity   Alcohol use: No   Drug use: No   Sexual activity: Not on file  Other Topics Concern   Not on file  Social History Narrative   Lives alone.   Right-handed.   No caffeine use.   Social Determinants of Health   Financial Resource Strain: Low Risk  (10/01/2021)   Overall Financial Resource Strain (CARDIA)  Difficulty of Paying Living Expenses: Not hard at all  Food Insecurity: No Food Insecurity (10/01/2021)   Hunger Vital Sign    Worried About Running Out of Food in the Last Year: Never true    Ran Out of Food in the Last Year: Never true  Transportation Needs: No Transportation Needs (10/01/2021)   PRAPARE - Hydrologist (Medical): No    Lack of Transportation (Non-Medical): No  Physical Activity: Inactive (10/01/2021)   Exercise Vital Sign    Days of Exercise per Week: 0 days    Minutes of Exercise per Session: 0 min  Stress: No Stress Concern Present (10/01/2021)   [place]    Feeling of Stress : Not at all  Social Connections: Not on file  Intimate Partner Violence: Not on file    Review of Systems:    Constitutional: No weight loss, fever or chills Skin: No rash Cardiovascular: No chest pain   Respiratory: No SOB  Gastrointestinal: See HPI and otherwise negative Genitourinary: No dysuria  Neurological: No headache, dizziness or syncope Musculoskeletal: No new muscle or joint pain Hematologic: No bruising Psychiatric: No history of depression or anxiety   Physical Exam:  Vital signs: BP 130/68   Pulse 72   Ht 4\' 6"  (1.372 m)   Wt 122 lb (55.3 kg)   BMI 29.42 kg/m    Constitutional:   Pleasant Elderly female appears to be in NAD, Well developed, Well  nourished, alert and cooperative Head:  Normocephalic and atraumatic. Eyes:   PEERL, EOMI. No icterus. Conjunctiva pink. Ears:  Normal auditory acuity. Neck:  Supple Throat: Oral cavity and pharynx without inflammation, swelling or lesion.  Respiratory: Respirations even and unlabored. Lungs clear to auscultation bilaterally.   No wheezes, crackles, or rhonchi.  Cardiovascular: Normal S1, S2. No MRG. Regular rate and rhythm. No peripheral edema, cyanosis or pallor.  Gastrointestinal:  Soft, nondistended, nontender. No rebound or guarding. Normal bowel sounds. No appreciable masses or hepatomegaly. Rectal: External: Decrease sphincter tone, severe dermatitis over the gluteal region, moist, some erythema and blanching of the skin, tender to palpation, soft brown stool all over the surface of this area which had to be cleaned away for exam, no fissure or external hemorrhoids; internal: Grade 2 hemorrhoids and soft brown stool, no fissure; anoscopy: Soft brown stool Msk:  Symmetrical without gross deformities. Without edema, no deformity or joint abnormality.  Neurologic:  Alert and  oriented x4;  grossly normal neurologically.  Skin:   Dry and intact without significant lesions or rashes. Psychiatric: Demonstrates good judgement and reason without abnormal affect or behaviors.  RELEVANT LABS AND IMAGING: CBC    Component Value Date/Time   WBC 12.7 (H) 07/17/2022 0918   RBC 4.12 07/17/2022 0918   HGB 12.0 07/17/2022 0918   HCT 36.4 07/17/2022 0918   PLT 324.0 07/17/2022 0918   MCV 88.5 07/17/2022 0918   MCH 28.3 10/10/2021 0552   MCHC 32.9 07/17/2022 0918   RDW 16.0 (H) 07/17/2022 0918   LYMPHSABS 4.6 (H) 07/17/2022 0918   MONOABS 0.6 07/17/2022 0918   EOSABS 0.2 07/17/2022 0918   BASOSABS 0.1 07/17/2022 0918    CMP     Component Value Date/Time   NA 138 07/17/2022 0918   NA 144 11/09/2020 1101   K 3.9 07/17/2022 0918   CL 102 07/17/2022 0918   CO2 29 07/17/2022 0918   GLUCOSE  93 07/17/2022 0918   BUN  13 07/17/2022 0918   BUN 25 11/09/2020 1101   CREATININE 0.83 07/17/2022 0918   CREATININE 0.93 05/18/2021 1623   CALCIUM 9.1 07/17/2022 0918   PROT 6.9 07/17/2022 0918   PROT 6.6 11/09/2020 1101   ALBUMIN 3.9 07/17/2022 0918   ALBUMIN 4.2 11/09/2020 1101   AST 12 07/17/2022 0918   ALT 10 07/17/2022 0918   ALKPHOS 55 07/17/2022 0918   BILITOT 0.8 07/17/2022 0918   BILITOT 0.8 11/09/2020 1101   GFRNONAA >60 10/10/2021 0552   GFRAA >60 03/22/2020 2148    Assessment: 1.  Rectal dermatitis: Irritated on exam today, patient describes itching and discomfort in this area, covered in soft stool at time of exam which is adding to the problem 2.  Grade 2 internal hemorrhoids: This is likely leading to her decreased sphincter tone and leakage of stool which is making the above worse 3.  Decreased sphincter tone: Likely due to age and weakened pelvic floor  Plan: 1.  Discussed with patient and her family I think her primary issue at this point is her dermatitis, told them to think of it as a "diaper rash".  Would recommend that she keep this area clean and dry and apply A&D ointment to this area multiple times a day.  Anytime that she sits down to go to the bathroom she should wipe delicately and then dab dry and then apply A&D.  If should occur multiple times throughout the day. 2.  For now discontinue Hydrocortisone ointment as she is applying this all over her bottom and is starting to thin and change the tissue. 3.  Can continue the Hydrocortisone suppositories for right now for a week long course, she would finish these on 11/18/2022. 4.  Continue fiber supplement with a goal of achieving a more firm stool so that she does not have leakage.  May need to consider pelvic floor therapy in the future. 5.  Patient follow in clinic with me in 2 months or sooner if necessary.  She was assigned to Dr. Loletha Carrow this morning.  Ellouise Newer, PA-C [facility] 11/14/2022, 11:21 AM  Cc: Mackie Pai, PA-C

## 2022-11-14 NOTE — Patient Instructions (Signed)
Apply A&D ointment every morning and after going to restroom throughout the day, make sure area is clean and dry before apply A&D ointment.   Stop Hydrocortisone cream.   Continue Hydrocortisone suppositories until Sunday then stop.   Continue Fiber supplement.

## 2022-11-14 NOTE — Progress Notes (Signed)
____________________________________________________________  Attending physician addendum:  Thank you for sending this case to me. I have reviewed the entire note and agree with the plan.  Even if this patient has hemorrhoids as suggested by recent PCP exam, your evaluation shows that it is an issue of perianal hygiene and skin care.  Wilfrid Lund, MD  ____________________________________________________________

## 2022-11-21 ENCOUNTER — Ambulatory Visit: Payer: Medicare HMO | Admitting: Neurology

## 2022-11-21 ENCOUNTER — Encounter: Payer: Self-pay | Admitting: Neurology

## 2022-11-21 ENCOUNTER — Telehealth: Payer: Self-pay | Admitting: Neurology

## 2022-11-21 NOTE — Telephone Encounter (Signed)
Pt will need new auth for Xeomin before 02/25/23 appt- current auth expries 11/27/22.

## 2022-11-22 ENCOUNTER — Telehealth: Payer: Self-pay | Admitting: Medical

## 2022-11-22 NOTE — Telephone Encounter (Signed)
Contacted Leslie Ward to schedule their annual wellness visit. Appointment made for 11/29/2022.  Verlee Rossetti; Care Guide Ambulatory Clinical Support [place] [facility] Direct Dial: [phone]

## 2022-11-25 ENCOUNTER — Encounter: Payer: Self-pay | Admitting: *Deleted

## 2022-11-25 ENCOUNTER — Other Ambulatory Visit: Payer: Self-pay | Admitting: Cardiology

## 2022-11-29 ENCOUNTER — Telehealth: Payer: Self-pay | Admitting: Medical

## 2022-11-29 NOTE — Telephone Encounter (Signed)
Copied from CRM [id number]. Topic: Medicare AWV >> Nov 29, 2022  9:11 AM Payton Doughty wrote: Reason for CRM: Called patient to reschedule Medicare Annual Wellness Visit (AWV). Left message for patient to call back and reschedule Medicare Annual Wellness Visit (AWV). LVM 11/29/22 :08am to r/s appt khc  Last date of AWV:   Please schedule an appointment at any time with Donne Anon, CMA  .  If any questions, please contact me.  Thank you ,  Verlee Rossetti; Care Guide Ambulatory Clinical Support [place] [facility] Direct Dial: [phone]

## 2022-12-07 ENCOUNTER — Other Ambulatory Visit: Payer: Self-pay | Admitting: Medical

## 2022-12-10 DIAGNOSIS — H401122 Primary open-angle glaucoma, left eye, moderate stage: Secondary | ICD-10-CM | POA: Diagnosis not present

## 2022-12-10 DIAGNOSIS — H40021 Open angle with borderline findings, high risk, right eye: Secondary | ICD-10-CM | POA: Diagnosis not present

## 2022-12-17 NOTE — Progress Notes (Signed)
Office Visit Note  Patient: Leslie Ward             Date of Birth: [birth year]           MRN: [id number]             PCP: [name], PA-C Referring: Marisue Brooklyn Visit Date: 12/18/2022   Subjective:   History of Present Illness: Leslie Ward is a 89 y.o. female here for follow up for chronic joint pain with possible seronegative inflammatory arthritis on low-dose prednisone 2.5 mg daily.  She had interval lab test with complete metabolic panel from December that was fine and blood pressure has been fine on the steroids.  Had concern due to ophthalmology exam with worsening intraocular pressure it was reported this may be steroid sensitive with possible risk to her vision with continued use.  So she is currently wondering whether there is any alternative as she had significant pain off of the prednisone but does not want to risk blindness as potential side effect.  More recently she is also experiencing some right-sided flank pain she does not recall any sort of fall or injury to the area not having GI symptoms and no skin changes seen.  She has not noticed any dysuria or hematuria, has increased frequency but this is not entirely new for her does not have history of frequent urinary tract infections.  Previous HPI 06/12/22 Leslie Ward is a 89 y.o. female here for follow up for evaluation of joint pain involving neck, shoulders, and wrists with elevated sedimentation rate. She has tapered down the prednisone dose down to 1 mg once daily which she continues and feels this is helpful. Her right knee has been painful particularly in the past 2 weeks. Pain is worst along the inside edge worst at night. She has not seen any visible swelling or erythema.   Previous HPI 01/09/2022 Leslie Ward is a 89 y.o. female here for follow up for evaluation of joint pain involving neck, shoulders, and wrists with elevated sedimentation rate.  She had several complications in the past  few months including a hospitalization for new small strokes.  Treatment for her glaucoma the symptoms are improving.  Since her last visit she took the prednisone 5 mg pretty consistently until last week.  She did not pick up the latest prescription awaiting plans from our visit but has not had a significant return of symptoms for about 1 week.   Previous HPI 08/24/2021 Leslie Ward is a 89 y.o. female here for evaluation of joint pain involving neck, shoulders, and wrists with elevated sedimentation rate.  She was ill with COVID in August of last year without severe acute complications and responded well to treatment with paxlovid.  However afterwards developed severe joint pain and stiffness in multiple areas worst affected in her neck shoulders and knees.  Her joint pains are worst first thing in the morning with a partial degree of improvement during the day.  She never noticed obvious visible swelling or overlying skin changes in affected areas. Besides the joint pains she is experiencing vertigo symptoms since her illness. She has some chronic osteoarthritis changes and dystonia with previous neck botox injections but nothing similar to the current symptoms. Imaging did not demonstrate any obvious related changes lab results showing very highly elevated ESR and CRP. She also developed probably UTI with E. Faecalis on urine culture which was treated during this time. Last month she resumed on prednisone at 5 mg daily dose  which was controlling symptoms well but stopped it this week prior to our visit. She notices symptoms come back within about 2 days after stopping any steroids.   Her son [name] is present at our visit today.   Labs reviewed 08/2021 ESR 75   04/2021 ANA neg RF neg ESR >130 CRP 152.1   Review of Systems  Constitutional:  Positive for fatigue.  HENT:  Negative for mouth sores and mouth dryness.   Eyes:  Positive for dryness.  Respiratory:  Negative for shortness of breath.    Cardiovascular:  Negative for chest pain and palpitations.  Gastrointestinal:  Negative for blood in stool, constipation and diarrhea.  Endocrine: Negative for increased urination.  Genitourinary:  Positive for involuntary urination.  Musculoskeletal:  Positive for joint pain, gait problem and joint pain. Negative for joint swelling, myalgias, muscle weakness, morning stiffness, muscle tenderness and myalgias.  Skin:  Negative for color change, rash, hair loss and sensitivity to sunlight.  Allergic/Immunologic: Negative for susceptible to infections.  Neurological:  Negative for dizziness and headaches.  Hematological:  Negative for swollen glands.  Psychiatric/Behavioral:  Negative for depressed mood and sleep disturbance. The patient is not nervous/anxious.     PMFS History:  Patient Active Problem List   Diagnosis Date Noted   Osteoarthritis of right knee 12/18/2022   Glaucoma 11/26/2021   Cerebrovascular accident (CVA) (HCC) 10/25/2021   Stroke (HCC) 10/2021   Syncope 10/09/2021   Primary open angle glaucoma (POAG) of both eyes, moderate stage 09/12/2021   Sedimentation rate elevation 08/24/2021   Bilateral shoulder pain 08/24/2021   Inflammatory arthritis 08/24/2021   Urinary frequency 08/24/2021   COVID 05/29/2021   Post-COVID chronic fatigue 05/29/2021   Post-COVID chronic joint pain 05/29/2021   Post-COVID chronic muscle pain 05/29/2021   Thyroid disease    Ganglion cyst 08/25/2020   Mixed incontinence 03/15/2020   Urgency incontinence 03/15/2020   Vertigo 12/01/2019   Cervical dystonia 03/08/2019   Hyperlipidemia 02/11/2019   Edema 02/11/2019   Hypertension 01/19/2019   Type 2 diabetes mellitus (HCC) 01/19/2019   Hypothyroidism (acquired) 01/19/2019   Neck pain 01/19/2019   Dystonia 01/19/2019   Adhesive capsulitis of left shoulder 12/22/2018    Past Medical History:  Diagnosis Date   Adhesive capsulitis of left shoulder 12/22/2018   Bilateral shoulder pain  08/24/2021   Cerebrovascular accident (CVA) (HCC) 10/25/2021   Cervical dystonia 03/08/2019   [employer] approved diagnosis.   COVID 05/29/2021   Dystonia    Edema 02/11/2019   Ganglion cyst 08/25/2020   Glaucoma    Hyperlipidemia 02/11/2019   Hypertension    Hypothyroidism (acquired) 01/19/2019   Inflammatory arthritis 08/24/2021   Mixed incontinence 03/15/2020   Formatting of this note might be different from the original.  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  Added automatically from request for surgery [id number]   Urinary frequency 08/24/2021   Vertigo 12/01/2019    Family History  Problem Relation Age of Onset   Stroke Mother    Heart  attack Father    Heart Problems Brother    High blood pressure Maternal Grandmother    Liver disease Neg Hx    Colon cancer Neg Hx    Esophageal cancer Neg Hx    Past Surgical History:  Procedure Laterality Date   NO PAST SURGERIES     Social History   Social History Narrative   Lives alone.   Right-handed.   No caffeine use.   Immunization History  Administered Date(s) Administered   Fluad Quad(high Dose 65+) 06/11/2019, 06/13/2020   Influenza-Unspecified 07/12/2021, 06/12/2022   Moderna Covid-19 Vaccine Bivalent Booster 70yrs & up 06/12/2021   Moderna SARS-COV2 Booster Vaccination 07/21/2020   PFIZER(Purple Top)SARS-COV-2 Vaccination 12/20/2019, 01/10/2020     Objective: Vital Signs: BP 120/66 (BP Location: Left Arm, Patient Position: Sitting,  Cuff Size: Normal)   Pulse 66   Resp 14   Ht 4\' 9"  (1.448 m)   Wt 121 lb (54.9 kg)   BMI 26.18 kg/m    Physical Exam Eyes:     Conjunctiva/sclera: Conjunctivae normal.  Cardiovascular:     Rate and Rhythm: Normal rate and regular rhythm.  Pulmonary:     Effort: Pulmonary effort is normal.     Breath sounds: Normal breath sounds.  Lymphadenopathy:     Cervical: No cervical adenopathy.  Skin:    General: Skin is warm and dry.     Findings: Bruising present.  Neurological:     Mental Status: She is alert.     Comments: Resting tremor  Psychiatric:        Mood and Affect: Mood normal.      Musculoskeletal Exam:  Shoulders full ROM no tenderness or swelling Elbows full ROM no tenderness or swelling Wrists full ROM no tenderness or swelling Fingers full ROM no tenderness or swelling Knees full ROM no tenderness or swelling Ankles full ROM no tenderness or swelling    Investigation: No additional findings.  Imaging: No results found.  Recent Labs: Lab Results  Component Value Date   WBC 12.7 (H) 07/17/2022   HGB 12.0 07/17/2022   PLT 324.0 07/17/2022   NA 139 12/18/2022   K 4.1 12/18/2022   CL 103 12/18/2022   CO2 27 12/18/2022   GLUCOSE 100 (H) 12/18/2022   BUN 26 (H) 12/18/2022   CREATININE 0.93 12/18/2022   BILITOT 0.6 12/18/2022   ALKPHOS 55 07/17/2022   AST 14 12/18/2022   ALT 8 12/18/2022   PROT 6.7 12/18/2022   ALBUMIN 3.9 07/17/2022   CALCIUM 9.4 12/18/2022   GFRAA >60 03/22/2020    Speciality Comments: No specialty comments available.  Procedures:  No procedures performed Allergies: Crestor [rosuvastatin calcium]   Assessment / Plan:     Visit Diagnoses: Inflammatory arthritis - Plan: Sedimentation rate, C-reactive protein, COMPLETE METABOLIC PANEL WITH GFR, meloxicam (MOBIC) 7.5 MG tablet  Discussed risk and benefits of low-dose steroids with possible contribution to worsening what sounds like steroid sensitive glaucoma bad enough to be  at risk for her vision.  Will recheck sed rate and CRP to monitor this systemic inflammation.  Discussed possible options to consider use of low-dose NSAIDs as an alternative.  She is at risk of some complications from this with previous stroke history and slightly decreased renal function but should be less risk for her eyes compared to continuing long-term steroids.  And we can monitor metabolic panel going forwards would need to discontinue if worsening renal function.  Urinary frequency - Plan: Urinalysis, Routine w reflex microscopic  Check urinalysis today for reported increased urinary frequency and flank pain but I think overall less suspicious for a UTI currently.  Primary osteoarthritis of right knee  Discussed report of knee pains consistent with some primary osteoarthritis but there is no swelling focal tenderness or limited mobility on exam today.  Orders: Orders Placed This Encounter  Procedures   MICROSCOPIC MESSAGE   Sedimentation rate   C-reactive protein   COMPLETE METABOLIC PANEL WITH GFR   Urinalysis, Routine w reflex microscopic   Meds ordered this encounter  Medications   meloxicam (MOBIC) 7.5 MG tablet    Sig: Take 1 tablet (7.5 mg total) by mouth daily.    Dispense:  90 tablet    Refill:  0     Follow-Up Instructions: Return in about 6 months (around 06/20/2023) for Arthritis GC v NSAIDs f/u 6mos.   Fuller Plan, MD  Note - This record has been created using [employer].  Chart creation errors have been sought, but may not always  have been located. Such creation errors do not reflect on  the standard of medical care.

## 2022-12-18 ENCOUNTER — Ambulatory Visit (INDEPENDENT_AMBULATORY_CARE_PROVIDER_SITE_OTHER): Payer: Medicare HMO | Admitting: *Deleted

## 2022-12-18 ENCOUNTER — Encounter: Payer: Self-pay | Admitting: Internal Medicine

## 2022-12-18 ENCOUNTER — Ambulatory Visit: Payer: Medicare HMO | Attending: Internal Medicine | Admitting: Internal Medicine

## 2022-12-18 VITALS — BP 120/66 | HR 66 | Resp 14 | Ht <= 58 in | Wt 121.0 lb

## 2022-12-18 DIAGNOSIS — M1711 Unilateral primary osteoarthritis, right knee: Secondary | ICD-10-CM

## 2022-12-18 DIAGNOSIS — R350 Frequency of micturition: Secondary | ICD-10-CM | POA: Diagnosis not present

## 2022-12-18 DIAGNOSIS — R700 Elevated erythrocyte sedimentation rate: Secondary | ICD-10-CM | POA: Diagnosis not present

## 2022-12-18 DIAGNOSIS — M1990 Unspecified osteoarthritis, unspecified site: Secondary | ICD-10-CM

## 2022-12-18 DIAGNOSIS — Z0000 Encounter for general adult medical examination without abnormal findings: Secondary | ICD-10-CM

## 2022-12-18 HISTORY — DX: Unilateral primary osteoarthritis, right knee: M17.11

## 2022-12-18 NOTE — Patient Instructions (Signed)
Ms. Piersma , Thank you for taking time to come for your Medicare Wellness Visit. I appreciate your ongoing commitment to your health goals. Please review the following plan we discussed and let me know if I can assist you in the future.      This is a list of the screening recommended for you and due dates:  Health Maintenance  Topic Date Due   Complete foot exam   Never done   Eye exam for diabetics  Never done   DTaP/Tdap/Td vaccine (1 - Tdap) Never done   Zoster (Shingles) Vaccine (1 of 2) Never done   Pneumonia Vaccine (1 of 1 - PCV) Never done   DEXA scan (bone density measurement)  Never done   COVID-19 Vaccine (5 - 2023-24 season) 04/12/2022   Hemoglobin A1C  04/13/2022   Flu Shot  03/13/2023   Medicare Annual Wellness Visit  12/18/2023   HPV Vaccine  Aged Out    Next appointment: Follow up in one year for your annual wellness visit.   Preventive Care 87 Years and Older, Female Preventive care refers to lifestyle choices and visits with your health care provider that can promote health and wellness. What does preventive care include? A yearly physical exam. This is also called an annual well check. Dental exams once or twice a year. Routine eye exams. Ask your health care provider how often you should have your eyes checked. Personal lifestyle choices, including: Daily care of your teeth and gums. Regular physical activity. Eating a healthy diet. Avoiding tobacco and drug use. Limiting alcohol use. Practicing safe sex. Taking low-dose aspirin every day. Taking vitamin and mineral supplements as recommended by your health care provider. What happens during an annual well check? The services and screenings done by your health care provider during your annual well check will depend on your age, overall health, lifestyle risk factors, and family history of disease. Counseling  Your health care provider may ask you questions about your: Alcohol use. Tobacco  use. Drug use. Emotional well-being. Home and relationship well-being. Sexual activity. Eating habits. History of falls. Memory and ability to understand (cognition). Work and work Astronomer. Reproductive health. Screening  You may have the following tests or measurements: Height, weight, and BMI. Blood pressure. Lipid and cholesterol levels. These may be checked every 5 years, or more frequently if you are over 38 years old. Skin check. Lung cancer screening. You may have this screening every year starting at age 24 if you have a 30-pack-year history of smoking and currently smoke or have quit within the past 15 years. Fecal occult blood test (FOBT) of the stool. You may have this test every year starting at age 32. Flexible sigmoidoscopy or colonoscopy. You may have a sigmoidoscopy every 5 years or a colonoscopy every 10 years starting at age 13. Hepatitis C blood test. Hepatitis B blood test. Sexually transmitted disease (STD) testing. Diabetes screening. This is done by checking your blood sugar (glucose) after you have not eaten for a while (fasting). You may have this done every 1-3 years. Bone density scan. This is done to screen for osteoporosis. You may have this done starting at age 51. Mammogram. This may be done every 1-2 years. Talk to your health care provider about how often you should have regular mammograms. Talk with your health care provider about your test results, treatment options, and if necessary, the need for more tests. Vaccines  Your health care provider may recommend certain vaccines, such as: Influenza  vaccine. This is recommended every year. Tetanus, diphtheria, and acellular pertussis (Tdap, Td) vaccine. You may need a Td booster every 10 years. Zoster vaccine. You may need this after age 55. Pneumococcal 13-valent conjugate (PCV13) vaccine. One dose is recommended after age 53. Pneumococcal polysaccharide (PPSV23) vaccine. One dose is recommended  after age 54. Talk to your health care provider about which screenings and vaccines you need and how often you need them. This information is not intended to replace advice given to you by your health care provider. Make sure you discuss any questions you have with your health care provider. Document Released: 08/25/2015 Document Revised: 04/17/2016 Document Reviewed: 05/30/2015 Elsevier Interactive Patient Education  2017 [place] Prevention in the Home Falls can cause injuries. They can happen to people of all ages. There are many things you can do to make your home safe and to help prevent falls. What can I do on the outside of my home? Regularly fix the edges of walkways and driveways and fix any cracks. Remove anything that might make you trip as you walk through a door, such as a raised step or threshold. Trim any bushes or trees on the path to your home. Use bright outdoor lighting. Clear any walking paths of anything that might make someone trip, such as rocks or tools. Regularly check to see if handrails are loose or broken. Make sure that both sides of any steps have handrails. Any raised decks and porches should have guardrails on the edges. Have any leaves, snow, or ice cleared regularly. Use sand or salt on walking paths during winter. Clean up any spills in your garage right away. This includes oil or grease spills. What can I do in the bathroom? Use night lights. Install grab bars by the toilet and in the tub and shower. Do not use towel bars as grab bars. Use non-skid mats or decals in the tub or shower. If you need to sit down in the shower, use a plastic, non-slip stool. Keep the floor dry. Clean up any water that spills on the floor as soon as it happens. Remove soap buildup in the tub or shower regularly. Attach bath mats securely with double-sided non-slip rug tape. Do not have throw rugs and other things on the floor that can make you trip. What can I do  in the bedroom? Use night lights. Make sure that you have a light by your bed that is easy to reach. Do not use any sheets or blankets that are too big for your bed. They should not hang down onto the floor. Have a firm chair that has side arms. You can use this for support while you get dressed. Do not have throw rugs and other things on the floor that can make you trip. What can I do in the kitchen? Clean up any spills right away. Avoid walking on wet floors. Keep items that you use a lot in easy-to-reach places. If you need to reach something above you, use a strong step stool that has a grab bar. Keep electrical cords out of the way. Do not use floor polish or wax that makes floors slippery. If you must use wax, use non-skid floor wax. Do not have throw rugs and other things on the floor that can make you trip. What can I do with my stairs? Do not leave any items on the stairs. Make sure that there are handrails on both sides of the stairs and use them. Fix  handrails that are broken or loose. Make sure that handrails are as long as the stairways. Check any carpeting to make sure that it is firmly attached to the stairs. Fix any carpet that is loose or worn. Avoid having throw rugs at the top or bottom of the stairs. If you do have throw rugs, attach them to the floor with carpet tape. Make sure that you have a light switch at the top of the stairs and the bottom of the stairs. If you do not have them, ask someone to add them for you. What else can I do to help prevent falls? Wear shoes that: Do not have high heels. Have rubber bottoms. Are comfortable and fit you well. Are closed at the toe. Do not wear sandals. If you use a stepladder: Make sure that it is fully opened. Do not climb a closed stepladder. Make sure that both sides of the stepladder are locked into place. Ask someone to hold it for you, if possible. Clearly mark and make sure that you can see: Any grab bars or  handrails. First and last steps. Where the edge of each step is. Use tools that help you move around (mobility aids) if they are needed. These include: Canes. Walkers. Scooters. Crutches. Turn on the lights when you go into a dark area. Replace any light bulbs as soon as they burn out. Set up your furniture so you have a clear path. Avoid moving your furniture around. If any of your floors are uneven, fix them. If there are any pets around you, be aware of where they are. Review your medicines with your doctor. Some medicines can make you feel dizzy. This can increase your chance of falling. Ask your doctor what other things that you can do to help prevent falls. This information is not intended to replace advice given to you by your health care provider. Make sure you discuss any questions you have with your health care provider. Document Released: 05/25/2009 Document Revised: 01/04/2016 Document Reviewed: 09/02/2014 Elsevier Interactive Patient Education  2017 [employer].

## 2022-12-18 NOTE — Progress Notes (Signed)
Subjective:   Leslie Ward is a 89 y.o. female who presents for Medicare Annual (Subsequent) preventive examination.  I connected with  [name] on 12/18/22 by a audio enabled telemedicine application and verified that I am speaking with the correct person using two identifiers.  Patient Location: Home  Provider Location: Office/Clinic  I discussed the limitations of evaluation and management by telemedicine. The patient expressed understanding and agreed to proceed.   Review of Systems     Cardiac Risk Factors include: advanced age (>30men, >105 women);diabetes mellitus;dyslipidemia;hypertension     Objective:    There were no vitals filed for this visit. There is no height or weight on file to calculate BMI.     12/18/2022   11:05 AM 10/10/2021    3:05 AM 10/10/2021    3:00 AM 10/09/2021    6:00 PM 10/01/2021    3:11 PM 03/20/2021    8:55 PM 09/26/2020    9:35 AM  Advanced Directives  Does Patient Have a Medical Advance Directive? Yes  No Yes Yes No Yes  Type of Estate agent of [place];Living will   Healthcare Power of [place];Living will Healthcare Power of [place];Living will  Healthcare Power of [place];Living will  Does patient want to make changes to medical advance directive?       No - Patient declined  Copy of Healthcare Power of Attorney in Chart? No - copy requested   No - copy requested No - copy requested  Yes - validated most recent copy scanned in chart (See row information)  Would patient like information on creating a medical advance directive?  No - Patient declined    No - Patient declined     Current Medications (verified) Outpatient Encounter Medications as of 12/18/2022  Medication Sig   amLODipine (NORVASC) 5 MG tablet Take 1 tablet (5 mg total) by mouth 2 (two) times daily.   aspirin EC (ASPIRIN LOW DOSE) 81 MG tablet Take 1 tablet (81 mg total) by mouth daily.   blood glucose meter kit and supplies Dispense based on  patient and insurance preference. Use up to four times daily as directed. (FOR ICD-10 E10.9, E11.9).   Calcium Carbonate (CALCIUM 600 PO) Take 1 tablet by mouth daily.   Cholecalciferol (VITAMIN D) 125 MCG (5000 UT) CAPS Take 5,000 Units by mouth daily.   Cinnamon 500 MG capsule Take 1,000 mg by mouth daily.   ezetimibe (ZETIA) 10 MG tablet TAKE 1 TABLET BY MOUTH DAILY   hydrocortisone (ANUSOL-HC) 25 MG suppository Place 1 suppository (25 mg total) rectally 2 (two) times daily. (Patient taking differently: Place 25 mg rectally as needed for hemorrhoids or anal itching.)   hydrocortisone (ANUSOL-HC) 25 MG suppository Place 1 suppository (25 mg total) rectally 2 (two) times daily.   Lancets (ONETOUCH DELICA PLUS LANCET33G) MISC USE TO CHECK BLOOD SUGAR UP TO FOUR TIMES DAILY AS DIRECTED   latanoprost (XALATAN) 0.005 % ophthalmic solution Place 1 drop into both eyes every evening.   levothyroxine (SYNTHROID) 50 MCG tablet Take 1 tablet (50 mcg total) by mouth daily before breakfast.   Menaquinone-7 (VITAMIN K2 PO) Take 1 tablet by mouth at bedtime.   metoprolol tartrate (LOPRESSOR) 50 MG tablet TAKE 1 TABLET BY MOUTH TWICE A DAY   Omega-3 Fatty Acids (OMEGA 3 PO) Take 2 tablets by mouth daily.   Phytosterol Esters (CHOLEST CARE PO) Take 1,600 mg by mouth daily.   predniSONE (DELTASONE) 2.5 MG tablet Take 1 tablet (2.5 mg total)  by mouth daily with breakfast.   Probiotic Product (PROBIOTIC DAILY PO) Take 1 tablet by mouth at bedtime.   telmisartan (MICARDIS) 20 MG tablet Take 1 tablet (20 mg total) by mouth daily.   vitamin C (ASCORBIC ACID) 500 MG tablet Take 500 mg by mouth daily.   Facility-Administered Encounter Medications as of 12/18/2022  Medication   incobotulinumtoxinA (XEOMIN) 100 units injection 100 Units   incobotulinumtoxinA (XEOMIN) 100 units injection 100 Units    Allergies (verified) Crestor [rosuvastatin calcium]   History: Past Medical History:  Diagnosis Date   Adhesive  capsulitis of left shoulder 12/22/2018   Bilateral shoulder pain 08/24/2021   Cerebrovascular accident (CVA) (HCC) 10/25/2021   Cervical dystonia 03/08/2019   Xeomin approved diagnosis.   COVID 05/29/2021   Dystonia    Edema 02/11/2019   Ganglion cyst 08/25/2020   Glaucoma    Hyperlipidemia 02/11/2019   Hypertension    Hypothyroidism (acquired) 01/19/2019   Inflammatory arthritis 08/24/2021   Mixed incontinence 03/15/2020   Formatting of this note might be different from the original.  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  Added automatically from request for surgery [id number]   Urinary frequency 08/24/2021   Vertigo 12/01/2019   Past Surgical History:  Procedure Laterality Date   NO PAST SURGERIES     Family History  Problem Relation Age of Onset   Stroke Mother    Heart attack Father    Heart Problems Brother    High blood pressure Maternal Grandmother    Liver disease Neg Hx    Colon cancer Neg Hx    Esophageal cancer Neg Hx    Social History   Socioeconomic History   Marital status: Widowed    Spouse name: Not on file   Number of children: 2   Years of education: 6 or 7 years of education   Highest education level: Not on file  Occupational History   Occupation: Retired  Tobacco Use   Smoking status: Never   Smokeless tobacco: Never  Vaping Use   Vaping Use: Never used  Substance and Sexual Activity   Alcohol use: No   Drug use: No   Sexual activity: Not on file  Other  Topics Concern   Not on file  Social History Narrative   Lives alone.   Right-handed.   No caffeine use.   Social Determinants of Health   Financial Resource Strain: Low Risk  (10/01/2021)   Overall Financial Resource Strain (CARDIA)    Difficulty of Paying Living Expenses: Not hard at all  Food Insecurity: No Food Insecurity (12/18/2022)   Hunger Vital Sign    Worried About Running Out of Food in the Last Year: Never true    Ran Out of Food in the Last Year: Never true  Transportation Needs: No Transportation Needs (12/18/2022)   PRAPARE - Administrator, Civil Service (Medical): No    Lack of Transportation (Non-Medical): No  Physical Activity: Inactive (10/01/2021)   Exercise Vital Sign  Days of Exercise per Week: 0 days    Minutes of Exercise per Session: 0 min  Stress: No Stress Concern Present (10/01/2021)   [employer] of Occupational Health - Occupational Stress Questionnaire    Feeling of Stress : Not at all  Social Connections: Moderately Isolated (12/18/2022)   Social Connection and Isolation Panel [NHANES]    Frequency of Communication with Friends and Family: More than three times a week    Frequency of Social Gatherings with Friends and Family: Twice a week    Attends Religious Services: More than 4 times per year    Active Member of [employer] or Organizations: No    Attends Banker Meetings: Never    Marital Status: Widowed    Tobacco Counseling Counseling given: Not Answered   Clinical Intake:  Pre-visit preparation completed: Yes  Pain : No/denies pain  Nutritional Risks: None Diabetes: Yes CBG done?: No Did pt. bring in CBG monitor from home?: No  How often do you need to have someone help you when you read instructions, pamphlets, or other written materials from your doctor or pharmacy?: 1 - Never   Activities of Daily Living    12/18/2022   11:11 AM  In your present state of health, do you have any difficulty performing  the following activities:  Hearing? 1  Comment wears hearing aids  Vision? 0  Difficulty concentrating or making decisions? 1  Walking or climbing stairs? 1  Dressing or bathing? 0  Doing errands, shopping? 1  Comment son drives her to appointments  Preparing Food and eating ? Y  Comment son helps prepare food but pt feeds herself  Using the Toilet? N  In the past six months, have you accidently leaked urine? N  Do you have problems with loss of bowel control? Y  Managing your Medications? Y  Comment son assists  Managing your Finances? N  Housekeeping or managing your Housekeeping? N    Patient Care Team: Saguier, Kateri Mc as PCP - General (Internal Medicine) Baldo Daub, MD as PCP - Cardiology (Cardiology)  Indicate any recent Medical Services you may have received from other than [facility] providers in the past year (date may be approximate).     Assessment:   This is a routine wellness examination for Leslie Ward.  Hearing/Vision screen No results found.  Dietary issues and exercise activities discussed: Current Exercise Habits: Home exercise routine, Type of exercise: stretching, Time (Minutes): 10, Frequency (Times/Week): 7, Weekly Exercise (Minutes/Week): 70, Intensity: Mild, Exercise limited by: orthopedic condition(s)   Goals Addressed   None    Depression Screen    12/18/2022   11:10 AM 07/17/2022    8:32 AM 10/01/2021    3:14 PM 05/29/2021    3:48 PM 05/18/2021    3:37 PM 10/15/2019   11:29 AM  PHQ 2/9 Scores  PHQ - 2 Score 0 0 0 0 0 0    Fall Risk    12/18/2022   11:08 AM 07/17/2022    8:31 AM 10/01/2021    3:12 PM 05/29/2021    3:48 PM 05/18/2021    3:38 PM  Fall Risk   Falls in the past year? 0 0 1 1 0  Number falls in past yr: 0 0 0 0 0  Injury with Fall? 0 0 0 0 0  Risk for fall due to : Impaired balance/gait History of fall(s)     Follow up Falls evaluation completed Falls evaluation completed Falls prevention discussed  Falls  evaluation completed     FALL RISK PREVENTION PERTAINING TO THE HOME:  Any stairs in or around the home? No  Home free of loose throw rugs in walkways, pet beds, electrical cords, etc? Yes  Adequate lighting in your home to reduce risk of falls? Yes   ASSISTIVE DEVICES UTILIZED TO PREVENT FALLS:  Life alert? Yes  Use of a cane, walker or w/c? Yes  Grab bars in the bathroom? Yes  Shower chair or bench in shower? Yes  Elevated toilet seat or a handicapped toilet? No   TIMED UP AND GO:  Was the test performed?  No, audio visit .    Cognitive Function:    12/18/2022   11:17 AM  MMSE - Mini Mental State Exam  Not completed: Unable to complete        Immunizations Immunization History  Administered Date(s) Administered   Fluad Quad(high Dose 65+) 06/11/2019, 06/13/2020   Influenza-Unspecified 07/12/2021, 06/12/2022   Moderna Covid-19 Vaccine Bivalent Booster 24yrs & up 06/12/2021   Moderna SARS-COV2 Booster Vaccination 07/21/2020   PFIZER(Purple Top)SARS-COV-2 Vaccination 12/20/2019, 01/10/2020    TDAP status: Due, Education has been provided regarding the importance of this vaccine. Advised may receive this vaccine at local pharmacy or Health Dept. Aware to provide a copy of the vaccination record if obtained from local pharmacy or Health Dept. Verbalized acceptance and understanding.  Flu Vaccine status: Up to date  Pneumococcal vaccine status: Due, Education has been provided regarding the importance of this vaccine. Advised may receive this vaccine at local pharmacy or Health Dept. Aware to provide a copy of the vaccination record if obtained from local pharmacy or Health Dept. Verbalized acceptance and understanding.  Covid-19 vaccine status: Information provided on how to obtain vaccines.   Qualifies for Shingles Vaccine? Yes   Zostavax completed No   Shingrix Completed?: No.    Education has been provided regarding the importance of this vaccine. Patient has been advised to call  insurance company to determine out of pocket expense if they have not yet received this vaccine. Advised may also receive vaccine at local pharmacy or Health Dept. Verbalized acceptance and understanding.  Screening Tests Health Maintenance  Topic Date Due   FOOT EXAM  Never done   OPHTHALMOLOGY EXAM  Never done   DTaP/Tdap/Td (1 - Tdap) Never done   Zoster Vaccines- Shingrix (1 of 2) Never done   Pneumonia Vaccine 59+ Years old (1 of 1 - PCV) Never done   DEXA SCAN  Never done   COVID-19 Vaccine (5 - 2023-24 season) 04/12/2022   HEMOGLOBIN A1C  04/13/2022   Medicare Annual Wellness (AWV)  10/01/2022   INFLUENZA VACCINE  03/13/2023   HPV VACCINES  Aged Out    Health Maintenance  Health Maintenance Due  Topic Date Due   FOOT EXAM  Never done   OPHTHALMOLOGY EXAM  Never done   DTaP/Tdap/Td (1 - Tdap) Never done   Zoster Vaccines- Shingrix (1 of 2) Never done   Pneumonia Vaccine 9+ Years old (1 of 1 - PCV) Never done   DEXA SCAN  Never done   COVID-19 Vaccine (5 - 2023-24 season) 04/12/2022   HEMOGLOBIN A1C  04/13/2022   Medicare Annual Wellness (AWV)  10/01/2022    Colorectal cancer screening: No longer required.   Mammogram status: No longer required due to pt declined..  Bone Density status: pt declined.  Lung Cancer Screening: (Low Dose CT Chest recommended if Age 67-80 years, 30 pack-year currently smoking OR  have quit w/in 15years.) does not qualify.   Additional Screening:  Hepatitis C Screening: does not qualify  Vision Screening: Recommended annual ophthalmology exams for early detection of glaucoma and other disorders of the eye. Is the patient up to date with their annual eye exam?  Yes  Who is the provider or what is the name of the office in which the patient attends annual eye exams? Dr. Isaias Cowman If pt is not established with a provider, would they like to be referred to a provider to establish care? No .   Dental Screening: Recommended annual dental exams  for proper oral hygiene  Community Resource Referral / Chronic Care Management: CRR required this visit?  Yes   CCM required this visit?  Yes      Plan:     I have personally reviewed and noted the following in the patient's chart:   Medical and social history Use of alcohol, tobacco or illicit drugs  Current medications and supplements including opioid prescriptions. Patient is not currently taking opioid prescriptions. Functional ability and status Nutritional status Physical activity Advanced directives List of other physicians Hospitalizations, surgeries, and ER visits in previous 12 months Vitals Screenings to include cognitive, depression, and falls Referrals and appointments  In addition, I have reviewed and discussed with patient certain preventive protocols, quality metrics, and best practice recommendations. A written personalized care plan for preventive services as well as general preventive health recommendations were provided to patient.   Due to this being a telephonic visit, the after visit summary with patients personalized plan was offered to patient via mail or my-chart. Patient would like to access on my-chart.  Donne Anon, [place]   12/18/2022   Nurse Notes: None

## 2022-12-19 ENCOUNTER — Other Ambulatory Visit: Payer: Self-pay

## 2022-12-19 DIAGNOSIS — M1990 Unspecified osteoarthritis, unspecified site: Secondary | ICD-10-CM

## 2022-12-19 DIAGNOSIS — R350 Frequency of micturition: Secondary | ICD-10-CM

## 2022-12-19 DIAGNOSIS — R700 Elevated erythrocyte sedimentation rate: Secondary | ICD-10-CM

## 2022-12-19 LAB — URINALYSIS, ROUTINE W REFLEX MICROSCOPIC
Bacteria, UA: NONE SEEN /HPF
Bilirubin Urine: NEGATIVE
Glucose, UA: NEGATIVE
Hgb urine dipstick: NEGATIVE
Ketones, ur: NEGATIVE
Nitrite: NEGATIVE
Protein, ur: NEGATIVE
Specific Gravity, Urine: 1.024 (ref 1.001–1.035)
pH: 5.5 (ref 5.0–8.0)

## 2022-12-19 LAB — COMPLETE METABOLIC PANEL WITH GFR
AG Ratio: 1.2 (calc) (ref 1.0–2.5)
ALT: 8 U/L (ref 6–29)
AST: 14 U/L (ref 10–35)
Albumin: 3.7 g/dL (ref 3.6–5.1)
Alkaline phosphatase (APISO): 61 U/L (ref 37–153)
BUN/Creatinine Ratio: 28 (calc) — ABNORMAL HIGH (ref 6–22)
BUN: 26 mg/dL — ABNORMAL HIGH (ref 7–25)
CO2: 27 mmol/L (ref 20–32)
Calcium: 9.4 mg/dL (ref 8.6–10.4)
Chloride: 103 mmol/L (ref 98–110)
Creat: 0.93 mg/dL (ref 0.60–0.95)
Globulin: 3 g/dL (calc) (ref 1.9–3.7)
Glucose, Bld: 100 mg/dL — ABNORMAL HIGH (ref 65–99)
Potassium: 4.1 mmol/L (ref 3.5–5.3)
Sodium: 139 mmol/L (ref 135–146)
Total Bilirubin: 0.6 mg/dL (ref 0.2–1.2)
Total Protein: 6.7 g/dL (ref 6.1–8.1)
eGFR: 58 mL/min/{1.73_m2} — ABNORMAL LOW (ref >=60)

## 2022-12-19 LAB — C-REACTIVE PROTEIN: CRP: 26.9 mg/L — ABNORMAL HIGH (ref <8.0)

## 2022-12-19 LAB — MICROSCOPIC MESSAGE

## 2022-12-19 LAB — SEDIMENTATION RATE: Sed Rate: 60 mm/h — ABNORMAL HIGH (ref 0–30)

## 2022-12-19 MED ORDER — MELOXICAM 7.5 MG PO TABS
7.5000 mg | ORAL_TABLET | Freq: Every day | ORAL | 0 refills | Status: DC
Start: 2022-12-19 — End: 2023-04-17

## 2022-12-19 NOTE — Progress Notes (Signed)
Sedimentation rate and CRP remain elevated at 60 and 26.9, from 60 and 20.2.  Kidney function estimated GFR of 58 this would be considered stage 2 or early stage 3a kidney disease. That increases her risk of side effects from meloxicam. I still believe at low dose the benefit outweighs the risk and we can recheck a metabolic panel after she starts the medicine to make sure it is not hurting anything. I recommend rechecking a BMP in 1 month.  Urine sample shows a few white blood cells but there were no bacteria seen. Based on our exam and this finding I do not think any infection is likely so not ordering any antibiotic. She could follow up with gynecology or urology if urinary frequency is a problem.

## 2022-12-19 NOTE — Progress Notes (Signed)
Yes, can go ahead and stop prednisone and start meloxicam.

## 2022-12-25 ENCOUNTER — Ambulatory Visit: Payer: Medicare HMO | Admitting: Neurology

## 2023-01-07 ENCOUNTER — Ambulatory Visit: Payer: Medicare HMO | Admitting: Internal Medicine

## 2023-01-13 ENCOUNTER — Ambulatory Visit: Payer: Medicare HMO | Admitting: Physician Assistant

## 2023-01-25 ENCOUNTER — Other Ambulatory Visit: Payer: Self-pay | Admitting: Medical

## 2023-02-24 ENCOUNTER — Other Ambulatory Visit: Payer: Self-pay

## 2023-02-24 MED ORDER — TELMISARTAN 20 MG PO TABS: 20.0000 mg | ORAL_TABLET | Freq: Every day | ORAL | 0 refills | Status: DC

## 2023-02-25 ENCOUNTER — Ambulatory Visit: Payer: Medicare HMO | Admitting: Neurology

## 2023-03-05 ENCOUNTER — Encounter: Payer: Self-pay | Admitting: *Deleted

## 2023-03-05 ENCOUNTER — Other Ambulatory Visit: Payer: Self-pay | Admitting: Medical

## 2023-03-05 ENCOUNTER — Encounter: Payer: Self-pay | Admitting: Cardiology

## 2023-03-06 ENCOUNTER — Other Ambulatory Visit: Payer: Self-pay | Admitting: Medical

## 2023-04-04 ENCOUNTER — Other Ambulatory Visit: Payer: Self-pay | Admitting: Medical

## 2023-04-10 ENCOUNTER — Ambulatory Visit (HOSPITAL_BASED_OUTPATIENT_CLINIC_OR_DEPARTMENT_OTHER)
Admission: RE | Admit: 2023-04-10 | Discharge: 2023-04-10 | Disposition: A | Discharge status: Home / Self Care | Payer: Medicare HMO | Source: Ambulatory Visit | Attending: Physician Assistant | Admitting: Physician Assistant

## 2023-04-10 ENCOUNTER — Ambulatory Visit (INDEPENDENT_AMBULATORY_CARE_PROVIDER_SITE_OTHER): Payer: Medicare HMO | Admitting: Physician Assistant

## 2023-04-10 ENCOUNTER — Encounter: Payer: Self-pay | Admitting: Physician Assistant

## 2023-04-10 VITALS — BP 134/66 | HR 68 | Temp 98.5°F | Wt 115.2 lb

## 2023-04-10 DIAGNOSIS — M25551 Pain in right hip: Secondary | ICD-10-CM | POA: Insufficient documentation

## 2023-04-10 DIAGNOSIS — M1990 Unspecified osteoarthritis, unspecified site: Secondary | ICD-10-CM | POA: Diagnosis not present

## 2023-04-10 DIAGNOSIS — R109 Unspecified abdominal pain: Secondary | ICD-10-CM | POA: Diagnosis not present

## 2023-04-10 DIAGNOSIS — Z008 Encounter for other general examination: Secondary | ICD-10-CM | POA: Diagnosis not present

## 2023-04-10 DIAGNOSIS — H269 Unspecified cataract: Secondary | ICD-10-CM | POA: Diagnosis not present

## 2023-04-10 DIAGNOSIS — E785 Hyperlipidemia, unspecified: Secondary | ICD-10-CM | POA: Diagnosis not present

## 2023-04-10 DIAGNOSIS — U099 Post covid-19 condition, unspecified: Secondary | ICD-10-CM | POA: Diagnosis not present

## 2023-04-10 DIAGNOSIS — Z8249 Family history of ischemic heart disease and other diseases of the circulatory system: Secondary | ICD-10-CM | POA: Diagnosis not present

## 2023-04-10 DIAGNOSIS — Z9181 History of falling: Secondary | ICD-10-CM | POA: Diagnosis not present

## 2023-04-10 DIAGNOSIS — N3946 Mixed incontinence: Secondary | ICD-10-CM | POA: Diagnosis not present

## 2023-04-10 DIAGNOSIS — I672 Cerebral atherosclerosis: Secondary | ICD-10-CM | POA: Diagnosis not present

## 2023-04-10 DIAGNOSIS — Z8673 Personal history of transient ischemic attack (TIA), and cerebral infarction without residual deficits: Secondary | ICD-10-CM | POA: Diagnosis not present

## 2023-04-10 DIAGNOSIS — E119 Type 2 diabetes mellitus without complications: Secondary | ICD-10-CM

## 2023-04-10 DIAGNOSIS — I10 Essential (primary) hypertension: Secondary | ICD-10-CM | POA: Diagnosis not present

## 2023-04-10 DIAGNOSIS — R2681 Unsteadiness on feet: Secondary | ICD-10-CM | POA: Diagnosis not present

## 2023-04-10 DIAGNOSIS — H409 Unspecified glaucoma: Secondary | ICD-10-CM | POA: Diagnosis not present

## 2023-04-10 LAB — COMPREHENSIVE METABOLIC PANEL
ALT: 7 U/L (ref 0–35)
AST: 12 U/L (ref 0–37)
Albumin: 3.7 g/dL (ref 3.5–5.2)
Alkaline Phosphatase: 59 U/L (ref 39–117)
BUN: 21 mg/dL (ref 6–23)
CO2: 27 mEq/L (ref 19–32)
Calcium: 9.3 mg/dL (ref 8.4–10.5)
Chloride: 105 mEq/L (ref 96–112)
Creatinine, Ser: 0.78 mg/dL (ref 0.40–1.20)
GFR: 66.82 mL/min (ref >60.00)
Glucose, Bld: 95 mg/dL (ref 70–99)
Potassium: 4.1 mEq/L (ref 3.5–5.1)
Sodium: 140 mEq/L (ref 135–145)
Total Bilirubin: 1 mg/dL (ref 0.2–1.2)
Total Protein: 6.7 g/dL (ref 6.0–8.3)

## 2023-04-10 LAB — CBC WITH DIFFERENTIAL/PLATELET
Basophils Absolute: 0.1 10*3/uL (ref 0.0–0.1)
Basophils Relative: 0.6 % (ref 0.0–3.0)
Eosinophils Absolute: 0.1 10*3/uL (ref 0.0–0.7)
Eosinophils Relative: 1.6 % (ref 0.0–5.0)
HCT: 41.1 % (ref 36.0–46.0)
Hemoglobin: 13 g/dL (ref 12.0–15.0)
Lymphocytes Relative: 39.2 % (ref 12.0–46.0)
Lymphs Abs: 3.3 10*3/uL (ref 0.7–4.0)
MCHC: 31.5 g/dL (ref 30.0–36.0)
MCV: 90 fl (ref 78.0–100.0)
Monocytes Absolute: 0.4 10*3/uL (ref 0.1–1.0)
Monocytes Relative: 5.2 % (ref 3.0–12.0)
Neutro Abs: 4.5 10*3/uL (ref 1.4–7.7)
Neutrophils Relative %: 53.4 % (ref 43.0–77.0)
Platelets: 335 10*3/uL (ref 150.0–400.0)
RBC: 4.56 Mil/uL (ref 3.87–5.11)
RDW: 15.4 % (ref 11.5–15.5)
WBC: 8.5 10*3/uL (ref 4.0–10.5)

## 2023-04-10 LAB — POCT URINALYSIS DIP (MANUAL ENTRY)
Bilirubin, UA: NEGATIVE
Blood, UA: NEGATIVE
Glucose, UA: NEGATIVE mg/dL
Ketones, POC UA: NEGATIVE mg/dL
Nitrite, UA: NEGATIVE
Protein Ur, POC: NEGATIVE mg/dL
Spec Grav, UA: 1.02 (ref 1.010–1.025)
Urobilinogen, UA: 0.2 E.U./dL
pH, UA: 5 (ref 5.0–8.0)

## 2023-04-10 LAB — HEMOGLOBIN A1C: Hgb A1c MFr Bld: 6.2 % (ref 4.6–6.5)

## 2023-04-10 LAB — LIPID PANEL
Cholesterol: 236 mg/dL — ABNORMAL HIGH (ref 0–200)
HDL: 61.8 mg/dL (ref >39.00)
LDL Cholesterol: 148 mg/dL — ABNORMAL HIGH (ref 0–99)
NonHDL: 174.07
Total CHOL/HDL Ratio: 4
Triglycerides: 128 mg/dL (ref 0.0–149.0)
VLDL: 25.6 mg/dL (ref 0.0–40.0)

## 2023-04-10 NOTE — Progress Notes (Signed)
Established patient visit   Patient: Leslie Ward   DOB: 1930-09-13   89 y.o. Female  MRN: [id number] Visit Date: 04/10/2023  Today's healthcare provider: Alfredia Ferguson, PA-C   Cc. Right sided abdominal pain  Subjective    HPI Pt presents with her daughter in law and son who is present on the phone.  Pt reports right sided lower abdominal pain/flank pain that occurs intermittently in episodes for the last 2 weeks - 1 month. Reports the pain occurs when she is 'doing things', and is relieved by laying down. Denies pain while standing, walking. Reports sometimes pain occurs when bending. Denies urinary symptoms. Pt is unable to characterize pain further.  Reports a fall in her kitchen where she landed on her right side 3 mo ago. Denies injury from that fall.  Medications: Outpatient Medications Prior to Visit  Medication Sig   amLODipine (NORVASC) 5 MG tablet Take 1 tablet (5 mg total) by mouth 2 (two) times daily.   aspirin EC (ASPIRIN LOW DOSE) 81 MG tablet Take 1 tablet (81 mg total) by mouth daily.   blood glucose meter kit and supplies Dispense based on patient and insurance preference. Use up to four times daily as directed. (FOR ICD-10 E10.9, E11.9).   Calcium Carbonate (CALCIUM 600 PO) Take 1 tablet by mouth daily.   Cholecalciferol (VITAMIN D) 125 MCG (5000 UT) CAPS Take 5,000 Units by mouth daily.   Cinnamon 500 MG capsule Take 1,000 mg by mouth daily.   dorzolamide-timolol (COSOPT) 2-0.5 % ophthalmic solution SMARTSIG:In Eye(s)   ezetimibe (ZETIA) 10 MG tablet TAKE 1 TABLET BY MOUTH DAILY   hydrocortisone (ANUSOL-HC) 25 MG suppository Place 1 suppository (25 mg total) rectally 2 (two) times daily. (Patient taking differently: Place 25 mg rectally as needed for hemorrhoids or anal itching.)   hydrocortisone (ANUSOL-HC) 25 MG suppository Place 1 suppository (25 mg total) rectally 2 (two) times daily.   Lancets (ONETOUCH DELICA PLUS LANCET33G) MISC USE TO CHECK  BLOOD SUGAR UP TO FOUR TIMES DAILY AS DIRECTED   latanoprost (XALATAN) 0.005 % ophthalmic solution Place 1 drop into both eyes every evening.   levothyroxine (SYNTHROID) 50 MCG tablet Take 1 tablet (50 mcg total) by mouth daily before breakfast.   meloxicam (MOBIC) 7.5 MG tablet Take 1 tablet (7.5 mg total) by mouth daily.   Menaquinone-7 (VITAMIN K2 PO) Take 1 tablet by mouth at bedtime.   metoprolol tartrate (LOPRESSOR) 50 MG tablet TAKE 1 TABLET BY MOUTH TWICE A DAY   Omega-3 Fatty Acids (OMEGA 3 PO) Take 2 tablets by mouth daily.   Phytosterol Esters (CHOLEST CARE PO) Take 1,600 mg by mouth daily.   Probiotic Product (PROBIOTIC DAILY PO) Take 1 tablet by mouth at bedtime.   telmisartan (MICARDIS) 20 MG tablet Take 1 tablet (20 mg total) by mouth daily.   vitamin C (ASCORBIC ACID) 500 MG tablet Take 500 mg by mouth daily.   Facility-Administered Medications Prior to Visit  Medication Dose Route Frequency Provider   incobotulinumtoxinA (XEOMIN) 100 units injection 100 Units  100 Units Intramuscular Q90 days Levert Feinstein, MD   incobotulinumtoxinA (XEOMIN) 100 units injection 100 Units  100 Units Intramuscular Q90 days Levert Feinstein, MD    Review of Systems  Constitutional:  Negative for fatigue and fever.  Respiratory:  Negative for cough and shortness of breath.   Cardiovascular:  Negative for chest pain and leg swelling.  Gastrointestinal:  Positive for abdominal pain.  Neurological:  Negative for dizziness  and headaches.      Objective    BP 134/66 (BP Location: Left Arm, Patient Position: Sitting, Cuff Size: Small)   Pulse 68   Temp 98.5 F (36.9 C) (Oral)   Wt 115 lb 3.2 oz (52.3 kg)   SpO2 98%   BMI 24.93 kg/m   Physical Exam Vitals reviewed.  Constitutional:      Appearance: She is not ill-appearing.  HENT:     Head: Normocephalic.  Eyes:     Conjunctiva/sclera: Conjunctivae normal.  Cardiovascular:     Rate and Rhythm: Normal rate.  Pulmonary:     Effort: Pulmonary  effort is normal. No respiratory distress.  Abdominal:     General: Abdomen is flat.     Palpations: Abdomen is soft.     Tenderness: There is no abdominal tenderness. Negative signs include Murphy's sign and McBurney's sign.  Musculoskeletal:     Comments: Full ROM bilateral hips without pain  B/l SLR negative  Neurological:     General: No focal deficit present.     Mental Status: She is alert and oriented to person, place, and time.  Psychiatric:        Mood and Affect: Mood normal.        Behavior: Behavior normal.      Results for orders placed or performed in visit on 04/10/23  POCT urinalysis dipstick  Result Value Ref Range   Color, UA yellow yellow   Clarity, UA clear clear   Glucose, UA negative negative mg/dL   Bilirubin, UA negative negative   Ketones, POC UA negative negative mg/dL   Spec Grav, UA 1.610 9.604 - 1.025   Blood, UA negative negative   pH, UA 5.0 5.0 - 8.0   Protein Ur, POC negative negative mg/dL   Urobilinogen, UA 0.2 0.2 or 1.0 E.U./dL   Nitrite, UA Negative Negative   Leukocytes, UA Small (1+) (A) Negative    Assessment & Plan     1. Abdominal pain, right lateral UA today with small leuk, ordered culture.  Possibility of nephrolithiasis? Ordered KUB - POCT urinalysis dipstick - Urine Culture - DG Abd 1 View; Future - DG Hip Unilat W OR W/O Pelvis 2-3 Views Right; Future  2. Primary hypertension - Comp Met (CMET) - CBC w/Diff  3. Type 2 diabetes mellitus without complication, without long-term current use of insulin (HCC) Pt's son (on phone) requests full labs today. - Comp Met (CMET) - CBC w/Diff - Lipid panel - HgB A1c  4. Right hip pain Pain is either right flank/lower abdominal or hip. As pt is not in pain today difficult to pin point. Given recent fall, will image right hip  - DG Hip Unilat W OR W/O Pelvis 2-3 Views Right; Future   Return if symptoms worsen or fail to improve. Will f/u with imaging results     I, Alfredia Ferguson, PA-C have reviewed all documentation for this visit. The documentation on  04/10/23   for the exam, diagnosis, procedures, and orders are all accurate and complete.    Alfredia Ferguson, PA-C  [facility] [phone] (phone) [phone] (fax)  [facility]

## 2023-04-11 LAB — URINE CULTURE
MICRO NUMBER:: 15399715
SPECIMEN QUALITY:: ADEQUATE

## 2023-04-15 ENCOUNTER — Ambulatory Visit: Payer: Medicare HMO | Admitting: Physician Assistant

## 2023-04-15 ENCOUNTER — Telehealth: Payer: Self-pay | Admitting: Physician Assistant

## 2023-04-15 ENCOUNTER — Encounter: Payer: Self-pay | Admitting: Physician Assistant

## 2023-04-15 NOTE — Telephone Encounter (Signed)
Good morning Leslie Ward,  Patients son called stating that patient did not need the appointment with you today at 11:00. I offered to reschedule and he stated she is better and does not need an appointment.

## 2023-04-16 ENCOUNTER — Ambulatory Visit: Payer: Medicare HMO | Admitting: Medical

## 2023-04-17 ENCOUNTER — Other Ambulatory Visit: Payer: Self-pay | Admitting: Internal Medicine

## 2023-04-17 DIAGNOSIS — M1711 Unilateral primary osteoarthritis, right knee: Secondary | ICD-10-CM

## 2023-04-17 DIAGNOSIS — M1990 Unspecified osteoarthritis, unspecified site: Secondary | ICD-10-CM

## 2023-04-17 MED ORDER — MELOXICAM 7.5 MG PO TABS: 7.5000 mg | ORAL_TABLET | Freq: Every day | ORAL | 0 refills | Status: DC

## 2023-04-17 NOTE — Telephone Encounter (Signed)
Patient contacted the office to request a medication refill.   1. Name of Medication: Meloxicam   2. How are you currently taking this medication (dosage and times per day)? 1 tablet / daily   3. What pharmacy would you like for that to be sent to? [facility] in [place]

## 2023-04-17 NOTE — Telephone Encounter (Signed)
Last Fill: 12/19/2022  Labs: 04/10/2023 normal  Next Visit: 06/23/2023  Last Visit: 12/18/2022  DX: Inflammatory arthritis   Current Dose per office note 12/18/2022: not mentioned  Okay to refill Meloxicam?

## 2023-04-21 ENCOUNTER — Other Ambulatory Visit: Payer: Self-pay | Admitting: Medical

## 2023-04-21 ENCOUNTER — Other Ambulatory Visit: Payer: Self-pay | Admitting: Cardiology

## 2023-05-30 ENCOUNTER — Other Ambulatory Visit: Payer: Self-pay | Admitting: Neurology

## 2023-05-30 ENCOUNTER — Other Ambulatory Visit: Payer: Self-pay | Admitting: Medical

## 2023-05-30 ENCOUNTER — Other Ambulatory Visit: Payer: Self-pay | Admitting: Cardiology

## 2023-06-02 ENCOUNTER — Other Ambulatory Visit: Payer: Self-pay | Admitting: Medical

## 2023-06-09 NOTE — Progress Notes (Deleted)
Office Visit Note  Patient: Leslie Ward             Date of Birth: [birth year]           MRN: [id number]             PCP: Marisue Brooklyn Referring: Marisue Brooklyn Visit Date: 06/23/2023   Subjective:  No chief complaint on file.   History of Present Illness: Leslie Ward is a 89 y.o. female here for follow up for chronic joint pain with possible seronegative inflammatory arthritis   Previous HPI 12/18/2022 Leslie Ward is a 89 y.o. female here for follow up for chronic joint pain with possible seronegative inflammatory arthritis on low-dose prednisone 2.5 mg daily.  She had interval lab test with complete metabolic panel from December that was fine and blood pressure has been fine on the steroids.  Had concern due to ophthalmology exam with worsening intraocular pressure it was reported this may be steroid sensitive with possible risk to her vision with continued use.  So she is currently wondering whether there is any alternative as she had significant pain off of the prednisone but does not want to risk blindness as potential side effect.  More recently she is also experiencing some right-sided flank pain she does not recall any sort of fall or injury to the area not having GI symptoms and no skin changes seen.  She has not noticed any dysuria or hematuria, has increased frequency but this is not entirely new for her does not have history of frequent urinary tract infections.   Previous HPI 06/12/22 Leslie Ward is a 89 y.o. female here for follow up for evaluation of joint pain involving neck, shoulders, and wrists with elevated sedimentation rate. She has tapered down the prednisone dose down to 1 mg once daily which she continues and feels this is helpful. Her right knee has been painful particularly in the past 2 weeks. Pain is worst along the inside edge worst at night. She has not seen any visible swelling or erythema.   Previous HPI 01/09/2022 Leslie Ward is a 89 y.o. female here for follow up for evaluation of joint pain involving neck, shoulders, and wrists with elevated sedimentation rate.  She had several complications in the past few months including a hospitalization for new small strokes.  Treatment for her glaucoma the symptoms are improving.  Since her last visit she took the prednisone 5 mg pretty consistently until last week.  She did not pick up the latest prescription awaiting plans from our visit but has not had a significant return of symptoms for about 1 week.   Previous HPI 08/24/2021 Leslie Ward is a 89 y.o. female here for evaluation of joint pain involving neck, shoulders, and wrists with elevated sedimentation rate.  She was ill with COVID in August of last year without severe acute complications and responded well to treatment with paxlovid.  However afterwards developed severe joint pain and stiffness in multiple areas worst affected in her neck shoulders and knees.  Her joint pains are worst first thing in the morning with a partial degree of improvement during the day.  She never noticed obvious visible swelling or overlying skin changes in affected areas. Besides the joint pains she is experiencing vertigo symptoms since her illness. She has some chronic osteoarthritis changes and dystonia with previous neck botox injections but nothing similar to the current symptoms. Imaging did not demonstrate any obvious related changes lab results showing very highly  elevated ESR and CRP. She also developed probably UTI with E. Faecalis on urine culture which was treated during this time. Last month she resumed on prednisone at 5 mg daily dose which was controlling symptoms well but stopped it this week prior to our visit. She notices symptoms come back within about 2 days after stopping any steroids.   Her son [name] is present at our visit today.   Labs reviewed 08/2021 ESR 75   04/2021 ANA neg RF neg ESR >130 CRP  152.1   No Rheumatology ROS completed.   PMFS History:  Patient Active Problem List   Diagnosis Date Noted   Osteoarthritis of right knee 12/18/2022   Glaucoma 11/26/2021   Cerebrovascular accident (CVA) (HCC) 10/25/2021   Stroke (HCC) 10/2021   Syncope 10/09/2021   Primary open angle glaucoma (POAG) of both eyes, moderate stage 09/12/2021   Sedimentation rate elevation 08/24/2021   Bilateral shoulder pain 08/24/2021   Inflammatory arthritis 08/24/2021   Urinary frequency 08/24/2021   COVID 05/29/2021   Post-COVID chronic fatigue 05/29/2021   Post-COVID chronic joint pain 05/29/2021   Post-COVID chronic muscle pain 05/29/2021   BPPV (benign paroxysmal positional vertigo), right 01/03/2021   Thyroid disease    Ganglion cyst 08/25/2020   Mixed incontinence 03/15/2020   Urgency incontinence 03/15/2020   Blepharitis of upper and lower eyelids of both eyes 01/07/2020   Dermatochalasis of both upper eyelids 01/07/2020   Hypertensive retinopathy of both eyes 01/07/2020   Macular RPE mottling 01/07/2020   Meibomian gland dysfunction (MGD) of both eyes 01/07/2020   Vitreous syneresis of both eyes 01/07/2020   Vertigo 12/01/2019   Cervical dystonia 03/08/2019   Hyperlipidemia 02/11/2019   Edema 02/11/2019   Hypertension 01/19/2019   Type 2 diabetes mellitus (HCC) 01/19/2019   Hypothyroidism (acquired) 01/19/2019   Neck pain 01/19/2019   Dystonia 01/19/2019   Adhesive capsulitis of left shoulder 12/22/2018    Past Medical History:  Diagnosis Date   Adhesive capsulitis of left shoulder 12/22/2018   Bilateral shoulder pain 08/24/2021   Cerebrovascular accident (CVA) (HCC) 10/25/2021   Cervical dystonia 03/08/2019   Xeomin approved diagnosis.   COVID 05/29/2021   Dystonia    Edema 02/11/2019   Ganglion cyst 08/25/2020   Glaucoma    Hyperlipidemia 02/11/2019   Hypertension    Hypothyroidism (acquired) 01/19/2019   Inflammatory arthritis 08/24/2021   Mixed incontinence  03/15/2020   Formatting of this note might be different from the original.  Added automatically from request for surgery [id number]   Neck pain 01/19/2019   Post-COVID chronic fatigue 05/29/2021   Post-COVID chronic joint pain 05/29/2021   Post-COVID chronic muscle pain 05/29/2021   Primary open angle glaucoma (POAG) of both eyes, moderate stage 09/12/2021   Sedimentation rate elevation 08/24/2021   Stroke (HCC) 10/2021   Syncope 10/09/2021   Thyroid disease    Type 2 diabetes mellitus (HCC) 01/19/2019   Urgency incontinence 03/15/2020   Formatting of this note might be different from the original.  Added automatically from request for surgery [id number]   Urinary frequency 08/24/2021   Vertigo 12/01/2019    Family History  Problem Relation Age of Onset   Stroke Mother    Heart attack Father    Heart Problems Brother    High blood pressure Maternal Grandmother    Liver disease Neg Hx    Colon cancer Neg Hx    Esophageal cancer Neg Hx    Past Surgical History:  Procedure Laterality Date  NO PAST SURGERIES     Social History   Social History Narrative   Lives alone.   Right-handed.   No caffeine use.   Immunization History  Administered Date(s) Administered   Fluad Quad(high Dose 65+) 06/11/2019, 06/13/2020   Influenza-Unspecified 07/12/2021, 06/12/2022   Moderna Covid-19 Vaccine Bivalent Booster 92yrs & up 06/12/2021   Moderna SARS-COV2 Booster Vaccination 07/21/2020   PFIZER(Purple Top)SARS-COV-2 Vaccination 12/20/2019, 01/10/2020     Objective: Vital Signs: There were no vitals taken for this visit.   Physical Exam   Musculoskeletal Exam: ***  CDAI Exam: CDAI Score: -- Patient Global: --; Provider Global: -- Swollen: --; Tender: -- Joint Exam 06/23/2023   No joint exam has been documented for this visit   There is currently no information documented on the homunculus. Go to the Rheumatology activity and complete the homunculus joint  exam.  Investigation: No additional findings.  Imaging: No results found.  Recent Labs: Lab Results  Component Value Date   WBC 8.5 04/10/2023   HGB 13.0 04/10/2023   PLT 335.0 04/10/2023   NA 140 04/10/2023   K 4.1 04/10/2023   CL 105 04/10/2023   CO2 27 04/10/2023   GLUCOSE 95 04/10/2023   BUN 21 04/10/2023   CREATININE 0.78 04/10/2023   BILITOT 1.0 04/10/2023   ALKPHOS 59 04/10/2023   AST 12 04/10/2023   ALT 7 04/10/2023   PROT 6.7 04/10/2023   ALBUMIN 3.7 04/10/2023   CALCIUM 9.3 04/10/2023   GFRAA >60 03/22/2020    Speciality Comments: No specialty comments available.  Procedures:  No procedures performed Allergies: Crestor [rosuvastatin calcium]   Assessment / Plan:     Visit Diagnoses: No diagnosis found.  ***  Orders: No orders of the defined types were placed in this encounter.  No orders of the defined types were placed in this encounter.    Follow-Up Instructions: No follow-ups on file.   Metta Clines, RT  Note - This record has been created using [employer].  Chart creation errors have been sought, but may not always  have been located. Such creation errors do not reflect on  the standard of medical care.

## 2023-06-11 ENCOUNTER — Emergency Department (HOSPITAL_BASED_OUTPATIENT_CLINIC_OR_DEPARTMENT_OTHER): Payer: Medicare HMO

## 2023-06-11 ENCOUNTER — Inpatient Hospital Stay (HOSPITAL_BASED_OUTPATIENT_CLINIC_OR_DEPARTMENT_OTHER)
Admission: EM | Admit: 2023-06-11 | Discharge: 2023-06-13 | DRG: 042 | Disposition: A | Discharge status: Home Health | Payer: Medicare HMO | Attending: Internal Medicine | Admitting: Internal Medicine

## 2023-06-11 ENCOUNTER — Encounter (HOSPITAL_BASED_OUTPATIENT_CLINIC_OR_DEPARTMENT_OTHER): Payer: Self-pay

## 2023-06-11 ENCOUNTER — Other Ambulatory Visit: Payer: Self-pay

## 2023-06-11 DIAGNOSIS — I639 Cerebral infarction, unspecified: Principal | ICD-10-CM | POA: Diagnosis present

## 2023-06-11 DIAGNOSIS — R4701 Aphasia: Secondary | ICD-10-CM | POA: Diagnosis not present

## 2023-06-11 DIAGNOSIS — Z7982 Long term (current) use of aspirin: Secondary | ICD-10-CM

## 2023-06-11 DIAGNOSIS — G9340 Encephalopathy, unspecified: Secondary | ICD-10-CM

## 2023-06-11 DIAGNOSIS — Z79899 Other long term (current) drug therapy: Secondary | ICD-10-CM

## 2023-06-11 DIAGNOSIS — Q278 Other specified congenital malformations of peripheral vascular system: Secondary | ICD-10-CM

## 2023-06-11 DIAGNOSIS — Z79890 Hormone replacement therapy (postmenopausal): Secondary | ICD-10-CM

## 2023-06-11 DIAGNOSIS — I63412 Cerebral infarction due to embolism of left middle cerebral artery: Secondary | ICD-10-CM | POA: Diagnosis not present

## 2023-06-11 DIAGNOSIS — R29706 NIHSS score 6: Secondary | ICD-10-CM | POA: Diagnosis present

## 2023-06-11 DIAGNOSIS — I10 Essential (primary) hypertension: Secondary | ICD-10-CM

## 2023-06-11 DIAGNOSIS — F0390 Unspecified dementia without behavioral disturbance: Secondary | ICD-10-CM | POA: Diagnosis present

## 2023-06-11 DIAGNOSIS — G2581 Restless legs syndrome: Secondary | ICD-10-CM | POA: Diagnosis present

## 2023-06-11 DIAGNOSIS — Z1152 Encounter for screening for COVID-19: Secondary | ICD-10-CM

## 2023-06-11 DIAGNOSIS — G319 Degenerative disease of nervous system, unspecified: Secondary | ICD-10-CM | POA: Diagnosis not present

## 2023-06-11 DIAGNOSIS — R4182 Altered mental status, unspecified: Secondary | ICD-10-CM | POA: Diagnosis not present

## 2023-06-11 DIAGNOSIS — E039 Hypothyroidism, unspecified: Secondary | ICD-10-CM

## 2023-06-11 DIAGNOSIS — I771 Stricture of artery: Secondary | ICD-10-CM | POA: Diagnosis not present

## 2023-06-11 DIAGNOSIS — E876 Hypokalemia: Secondary | ICD-10-CM | POA: Diagnosis present

## 2023-06-11 DIAGNOSIS — Z603 Acculturation difficulty: Secondary | ICD-10-CM | POA: Diagnosis present

## 2023-06-11 DIAGNOSIS — E785 Hyperlipidemia, unspecified: Secondary | ICD-10-CM | POA: Diagnosis present

## 2023-06-11 DIAGNOSIS — I700 Atherosclerosis of aorta: Secondary | ICD-10-CM | POA: Diagnosis present

## 2023-06-11 DIAGNOSIS — Z8673 Personal history of transient ischemic attack (TIA), and cerebral infarction without residual deficits: Secondary | ICD-10-CM | POA: Diagnosis not present

## 2023-06-11 DIAGNOSIS — E119 Type 2 diabetes mellitus without complications: Secondary | ICD-10-CM | POA: Diagnosis not present

## 2023-06-11 DIAGNOSIS — Z7902 Long term (current) use of antithrombotics/antiplatelets: Secondary | ICD-10-CM

## 2023-06-11 DIAGNOSIS — Z888 Allergy status to other drugs, medicaments and biological substances status: Secondary | ICD-10-CM

## 2023-06-11 DIAGNOSIS — Z8249 Family history of ischemic heart disease and other diseases of the circulatory system: Secondary | ICD-10-CM

## 2023-06-11 DIAGNOSIS — Z8616 Personal history of COVID-19: Secondary | ICD-10-CM

## 2023-06-11 DIAGNOSIS — G249 Dystonia, unspecified: Secondary | ICD-10-CM | POA: Diagnosis present

## 2023-06-11 DIAGNOSIS — R944 Abnormal results of kidney function studies: Secondary | ICD-10-CM | POA: Diagnosis present

## 2023-06-11 DIAGNOSIS — Z823 Family history of stroke: Secondary | ICD-10-CM

## 2023-06-11 HISTORY — DX: Encephalopathy, unspecified: G93.40

## 2023-06-11 LAB — COMPREHENSIVE METABOLIC PANEL
ALT: 11 U/L (ref 0–44)
AST: 15 U/L (ref 15–41)
Albumin: 3.6 g/dL (ref 3.5–5.0)
Alkaline Phosphatase: 68 U/L (ref 38–126)
Anion gap: 14 (ref 5–15)
BUN: 24 mg/dL — ABNORMAL HIGH (ref 8–23)
CO2: 27 mmol/L (ref 22–32)
Calcium: 9.5 mg/dL (ref 8.9–10.3)
Chloride: 99 mmol/L (ref 98–111)
Creatinine, Ser: 0.8 mg/dL (ref 0.44–1.00)
GFR, Estimated: >60 mL/min (ref >60)
Glucose, Bld: 119 mg/dL — ABNORMAL HIGH (ref 70–99)
Potassium: 3.9 mmol/L (ref 3.5–5.1)
Sodium: 140 mmol/L (ref 135–145)
Total Bilirubin: 1 mg/dL (ref 0.3–1.2)
Total Protein: 7.6 g/dL (ref 6.5–8.1)

## 2023-06-11 LAB — CBC
HCT: 40.7 % (ref 36.0–46.0)
Hemoglobin: 13.2 g/dL (ref 12.0–15.0)
MCH: 28.7 pg (ref 26.0–34.0)
MCHC: 32.4 g/dL (ref 30.0–36.0)
MCV: 88.5 fL (ref 80.0–100.0)
Platelets: 313 10*3/uL (ref 150–400)
RBC: 4.6 MIL/uL (ref 3.87–5.11)
RDW: 15 % (ref 11.5–15.5)
WBC: 10.5 10*3/uL (ref 4.0–10.5)
nRBC: 0 % (ref 0.0–0.2)

## 2023-06-11 LAB — RESP PANEL BY RT-PCR (RSV, FLU A&B, COVID)  RVPGX2
Influenza A by PCR: NEGATIVE
Influenza B by PCR: NEGATIVE
Resp Syncytial Virus by PCR: NEGATIVE
SARS Coronavirus 2 by RT PCR: NEGATIVE

## 2023-06-11 LAB — ETHANOL: Alcohol, Ethyl (B): <10 mg/dL (ref <10)

## 2023-06-11 LAB — CBG MONITORING, ED: Glucose-Capillary: 141 mg/dL — ABNORMAL HIGH (ref 70–99)

## 2023-06-11 MED ORDER — IOHEXOL 350 MG/ML SOLN
75.0000 mL | Freq: Once | INTRAVENOUS | Status: AC | PRN
  Administered 2023-06-11: 75 mL via INTRAVENOUS

## 2023-06-11 NOTE — ED Notes (Signed)
Pt. Speaks spanish and english,   more spanish and english.  Pt. Has no physical deficits but continues to say 'I can't remember".  Pt. Is in no distress and con't to cry.  Pt. Sons are in other states and Pt. Has a family friend who brought her here to the ED.    Pt. Has a bladder stimulator with inability to turn it off.  EDP has made arrangements for the Pt. To be admitted for this to happen.   Pt. Can communicate but has trouble with deep communication and is unable to tell details and names.

## 2023-06-11 NOTE — ED Provider Notes (Signed)
[facility] Provider Note   CSN: [id number] Arrival date & time: 06/11/23  1903     History {Add pertinent medical, surgical, social history, OB history to HPI:1} Chief Complaint  Patient presents with   Altered Mental Status    Leslie Ward is a 89 y.o. female.  HPI     89yo female with history of type II DM, hypertension, hyperlipidemia, CVA, hypothyroidism, dementia, presents with concern for     [phone] Ruffin Pyo who lives with--in [place] now [phone] Wilber Oliphant son in [place]  Past Medical History:  Diagnosis Date   Adhesive capsulitis of left shoulder 12/22/2018   Bilateral shoulder pain 08/24/2021   Cerebrovascular accident (CVA) (HCC) 10/25/2021   Cervical dystonia 03/08/2019   Xeomin approved diagnosis.   COVID 05/29/2021   Dystonia    Edema 02/11/2019   Ganglion cyst 08/25/2020   Glaucoma    Hyperlipidemia 02/11/2019   Hypertension    Hypothyroidism (acquired) 01/19/2019   Inflammatory arthritis 08/24/2021   Mixed incontinence 03/15/2020   Formatting of this note might be different from the original.  Added automatically from request for surgery [id number]   Neck pain 01/19/2019   Post-COVID chronic fatigue 05/29/2021   Post-COVID chronic joint pain 05/29/2021   Post-COVID chronic muscle pain 05/29/2021   Primary open angle glaucoma (POAG) of both eyes, moderate stage 09/12/2021   Sedimentation rate elevation 08/24/2021   Stroke (HCC) 10/2021   Syncope 10/09/2021   Thyroid disease    Type 2 diabetes mellitus (HCC) 01/19/2019   Urgency incontinence 03/15/2020   Formatting of this note might be different from the original.  Added automatically from request for surgery [id number]   Urinary frequency 08/24/2021   Vertigo 12/01/2019    Home Medications Prior to Admission medications   Medication Sig Start Date End Date Taking? Authorizing Provider  amLODipine (NORVASC) 5 MG tablet Take 1 tablet (5 mg  total) by mouth 2 (two) times daily. 10/31/22   Revankar, Aundra Dubin, MD  aspirin EC (ASPIRIN LOW DOSE) 81 MG tablet Take 1 tablet (81 mg total) by mouth daily. 08/15/22   Baldo Daub, MD  blood glucose meter kit and supplies Dispense based on patient and insurance preference. Use up to four times daily as directed. (FOR ICD-10 E10.9, E11.9). 08/17/21   Saguier, Ramon Dredge, PA-C  Calcium Carbonate (CALCIUM 600 PO) Take 1 tablet by mouth daily.    [provider]  Cholecalciferol (VITAMIN D) 125 MCG (5000 UT) CAPS Take 5,000 Units by mouth daily.    [provider]  Cinnamon 500 MG capsule Take 1,000 mg by mouth daily.    [provider]  dorzolamide-timolol (COSOPT) 2-0.5 % ophthalmic solution SMARTSIG:In Eye(s) 10/30/22   [provider]  ezetimibe (ZETIA) 10 MG tablet Take 1 tablet (10 mg total) by mouth daily. 04/22/23   Baldo Daub, MD  hydrocortisone (ANUSOL-HC) 25 MG suppository Place 1 suppository (25 mg total) rectally 2 (two) times daily. Patient taking differently: Place 25 mg rectally as needed for hemorrhoids or anal itching. 12/25/21   Saguier, Ramon Dredge, PA-C  hydrocortisone (ANUSOL-HC) 25 MG suppository Place 1 suppository (25 mg total) rectally 2 (two) times daily. 11/11/22   Saguier, Ramon Dredge, PA-C  Lancets (ONETOUCH DELICA PLUS LANCET33G) MISC USE TO CHECK BLOOD SUGAR UP TO FOUR TIMES DAILY AS DIRECTED 09/20/21   Saguier, Ramon Dredge, PA-C  latanoprost (XALATAN) 0.005 % ophthalmic solution Place 1 drop into both eyes every evening. 09/12/21   [provider]  levothyroxine (SYNTHROID) 50 MCG tablet TAKE 1 TABLET BY MOUTH DAILY BEFORE BREAKFAST 05/30/23   Saguier, Ramon Dredge, PA-C  meloxicam (MOBIC) 7.5 MG tablet Take 1 tablet (7.5 mg total) by mouth daily. 04/17/23   Rice, Jamesetta Orleans, MD  Menaquinone-7 (VITAMIN K2 PO) Take 1 tablet by mouth at bedtime.    [provider]  metoprolol tartrate (LOPRESSOR) 50 MG tablet TAKE 1 TABLET BY MOUTH 2 TIMES A DAY  06/02/23   Saguier, Ramon Dredge, PA-C  Omega-3 Fatty Acids (OMEGA 3 PO) Take 2 tablets by mouth daily.    [provider]  Phytosterol Esters (CHOLEST CARE PO) Take 1,600 mg by mouth daily.    [provider]  Probiotic Product (PROBIOTIC DAILY PO) Take 1 tablet by mouth at bedtime.    [provider]  telmisartan (MICARDIS) 20 MG tablet Take 1 tablet (20 mg total) by mouth daily. 05/30/23   Revankar, Aundra Dubin, MD  vitamin C (ASCORBIC ACID) 500 MG tablet Take 500 mg by mouth daily.    [provider]      Allergies    Crestor [rosuvastatin calcium]    Review of Systems   Review of Systems  Physical Exam Updated Vital Signs BP (!) 140/58 (BP Location: Left Arm)   Pulse 65   Temp 98 F (36.7 C)   Resp 20   Ht 4\' 9"  (1.448 m)   SpO2 96%   BMI 24.93 kg/m  Physical Exam  ED Results / Procedures / Treatments   Labs (all labs ordered are listed, but only abnormal results are displayed) Labs Reviewed  CBG MONITORING, ED - Abnormal; Notable for the following components:      Result Value   Glucose-Capillary 141 (*)    All other components within normal limits  COMPREHENSIVE METABOLIC PANEL  CBC  CBG MONITORING, ED    EKG None  Radiology No results found.  Procedures Procedures  {Document cardiac monitor, telemetry assessment procedure when appropriate:1}  Medications Ordered in ED Medications - No data to display  ED Course/ Medical Decision Making/ A&P   {   Click here for ABCD2, HEART and other calculatorsREFRESH Note before signing :1}                              Medical Decision Making Amount and/or Complexity of Data Reviewed Labs: ordered.   ***  {Document critical care time when appropriate:1} {Document review of labs and clinical decision tools ie heart score, Chads2Vasc2 etc:1}  {Document your independent review of radiology images, and any outside records:1} {Document your discussion with family members, caretakers,  and with consultants:1} {Document social determinants of health affecting pt's care:1} {Document your decision making why or why not admission, treatments were needed:1} Final Clinical Impression(s) / ED Diagnoses Final diagnoses:  None    Rx / DC Orders ED Discharge Orders     None

## 2023-06-11 NOTE — ED Triage Notes (Signed)
Pt brought in for altered mental status, disoriented x4. Pt was brought in by a family friend who has her son on the phone to give more details. Last night she did not sleep well and this morning her son noticed she was confused. Son reports she is has no hx of dementia and is not normally confused. She spoke to her son on the phone yesterday evening at 1847 (he lives out of town) and she was not confused. He called her this morning at 10am and "she was not normal, not acting right."   Pt is spanish speaking, denies feeling any pain. Denies feeling bad.

## 2023-06-12 ENCOUNTER — Encounter (HOSPITAL_COMMUNITY): Payer: Self-pay | Admitting: Family Medicine

## 2023-06-12 ENCOUNTER — Other Ambulatory Visit: Payer: Self-pay

## 2023-06-12 ENCOUNTER — Inpatient Hospital Stay (HOSPITAL_COMMUNITY): Payer: Medicare HMO

## 2023-06-12 DIAGNOSIS — I10 Essential (primary) hypertension: Secondary | ICD-10-CM | POA: Diagnosis not present

## 2023-06-12 DIAGNOSIS — Z603 Acculturation difficulty: Secondary | ICD-10-CM | POA: Diagnosis not present

## 2023-06-12 DIAGNOSIS — G9340 Encephalopathy, unspecified: Secondary | ICD-10-CM | POA: Diagnosis not present

## 2023-06-12 DIAGNOSIS — R29818 Other symptoms and signs involving the nervous system: Secondary | ICD-10-CM | POA: Diagnosis not present

## 2023-06-12 DIAGNOSIS — G249 Dystonia, unspecified: Secondary | ICD-10-CM | POA: Diagnosis not present

## 2023-06-12 DIAGNOSIS — Z7982 Long term (current) use of aspirin: Secondary | ICD-10-CM | POA: Diagnosis not present

## 2023-06-12 DIAGNOSIS — R4701 Aphasia: Secondary | ICD-10-CM

## 2023-06-12 DIAGNOSIS — E876 Hypokalemia: Secondary | ICD-10-CM | POA: Diagnosis not present

## 2023-06-12 DIAGNOSIS — Q278 Other specified congenital malformations of peripheral vascular system: Secondary | ICD-10-CM | POA: Diagnosis not present

## 2023-06-12 DIAGNOSIS — Z8616 Personal history of COVID-19: Secondary | ICD-10-CM | POA: Diagnosis not present

## 2023-06-12 DIAGNOSIS — I6389 Other cerebral infarction: Secondary | ICD-10-CM | POA: Diagnosis not present

## 2023-06-12 DIAGNOSIS — Z888 Allergy status to other drugs, medicaments and biological substances status: Secondary | ICD-10-CM | POA: Diagnosis not present

## 2023-06-12 DIAGNOSIS — Z8673 Personal history of transient ischemic attack (TIA), and cerebral infarction without residual deficits: Secondary | ICD-10-CM | POA: Diagnosis not present

## 2023-06-12 DIAGNOSIS — R29706 NIHSS score 6: Secondary | ICD-10-CM | POA: Diagnosis not present

## 2023-06-12 DIAGNOSIS — Z79899 Other long term (current) drug therapy: Secondary | ICD-10-CM | POA: Diagnosis not present

## 2023-06-12 DIAGNOSIS — R944 Abnormal results of kidney function studies: Secondary | ICD-10-CM | POA: Diagnosis not present

## 2023-06-12 DIAGNOSIS — I63412 Cerebral infarction due to embolism of left middle cerebral artery: Secondary | ICD-10-CM | POA: Diagnosis not present

## 2023-06-12 DIAGNOSIS — I63512 Cerebral infarction due to unspecified occlusion or stenosis of left middle cerebral artery: Secondary | ICD-10-CM | POA: Diagnosis not present

## 2023-06-12 DIAGNOSIS — I639 Cerebral infarction, unspecified: Secondary | ICD-10-CM | POA: Diagnosis not present

## 2023-06-12 DIAGNOSIS — Z79890 Hormone replacement therapy (postmenopausal): Secondary | ICD-10-CM | POA: Diagnosis not present

## 2023-06-12 DIAGNOSIS — Z823 Family history of stroke: Secondary | ICD-10-CM | POA: Diagnosis not present

## 2023-06-12 DIAGNOSIS — E785 Hyperlipidemia, unspecified: Secondary | ICD-10-CM | POA: Diagnosis not present

## 2023-06-12 DIAGNOSIS — I6782 Cerebral ischemia: Secondary | ICD-10-CM | POA: Diagnosis not present

## 2023-06-12 DIAGNOSIS — F0390 Unspecified dementia without behavioral disturbance: Secondary | ICD-10-CM | POA: Diagnosis not present

## 2023-06-12 DIAGNOSIS — I700 Atherosclerosis of aorta: Secondary | ICD-10-CM | POA: Diagnosis not present

## 2023-06-12 DIAGNOSIS — G2581 Restless legs syndrome: Secondary | ICD-10-CM | POA: Diagnosis not present

## 2023-06-12 DIAGNOSIS — E039 Hypothyroidism, unspecified: Secondary | ICD-10-CM | POA: Diagnosis not present

## 2023-06-12 DIAGNOSIS — Z1152 Encounter for screening for COVID-19: Secondary | ICD-10-CM | POA: Diagnosis not present

## 2023-06-12 DIAGNOSIS — E119 Type 2 diabetes mellitus without complications: Secondary | ICD-10-CM | POA: Diagnosis not present

## 2023-06-12 DIAGNOSIS — Z8249 Family history of ischemic heart disease and other diseases of the circulatory system: Secondary | ICD-10-CM | POA: Diagnosis not present

## 2023-06-12 LAB — HEMOGLOBIN A1C
Hgb A1c MFr Bld: 6.1 % — ABNORMAL HIGH (ref 4.8–5.6)
Mean Plasma Glucose: 128.37 mg/dL

## 2023-06-12 LAB — LIPID PANEL
Cholesterol: 233 mg/dL — ABNORMAL HIGH (ref 0–200)
HDL: 63 mg/dL (ref >40)
LDL Cholesterol: 150 mg/dL — ABNORMAL HIGH (ref 0–99)
Total CHOL/HDL Ratio: 3.7 {ratio}
Triglycerides: 102 mg/dL (ref <150)
VLDL: 20 mg/dL (ref 0–40)

## 2023-06-12 LAB — ECHOCARDIOGRAM COMPLETE
AR max vel: 2.36 cm2
AV Area VTI: 2.39 cm2
AV Area mean vel: 2.34 cm2
AV Mean grad: 3 mm[Hg]
AV Peak grad: 6.2 mm[Hg]
Ao pk vel: 1.24 m/s
Area-P 1/2: 2.76 cm2
Height: 57 in
S' Lateral: 2.2 cm
Weight: 1940.05 [oz_av]

## 2023-06-12 LAB — BASIC METABOLIC PANEL
Anion gap: 9 (ref 5–15)
BUN: 15 mg/dL (ref 8–23)
CO2: 24 mmol/L (ref 22–32)
Calcium: 9.1 mg/dL (ref 8.9–10.3)
Chloride: 105 mmol/L (ref 98–111)
Creatinine, Ser: 0.76 mg/dL (ref 0.44–1.00)
GFR, Estimated: >60 mL/min (ref >60)
Glucose, Bld: 94 mg/dL (ref 70–99)
Potassium: 3.3 mmol/L — ABNORMAL LOW (ref 3.5–5.1)
Sodium: 138 mmol/L (ref 135–145)

## 2023-06-12 LAB — CBC
HCT: 40.2 % (ref 36.0–46.0)
Hemoglobin: 13.1 g/dL (ref 12.0–15.0)
MCH: 28.8 pg (ref 26.0–34.0)
MCHC: 32.6 g/dL (ref 30.0–36.0)
MCV: 88.4 fL (ref 80.0–100.0)
Platelets: 309 10*3/uL (ref 150–400)
RBC: 4.55 MIL/uL (ref 3.87–5.11)
RDW: 14.7 % (ref 11.5–15.5)
WBC: 8.7 10*3/uL (ref 4.0–10.5)
nRBC: 0 % (ref 0.0–0.2)

## 2023-06-12 LAB — GLUCOSE, CAPILLARY: Glucose-Capillary: 103 mg/dL — ABNORMAL HIGH (ref 70–99)

## 2023-06-12 MED ORDER — ACETAMINOPHEN 325 MG PO TABS
650.0000 mg | ORAL_TABLET | ORAL | Status: DC | PRN
  Administered 2023-06-12: 650 mg via ORAL
  Filled 2023-06-12: qty 2

## 2023-06-12 MED ORDER — SODIUM CHLORIDE 0.9 % IV SOLN: INTRAVENOUS | Status: AC

## 2023-06-12 MED ORDER — SENNOSIDES-DOCUSATE SODIUM 8.6-50 MG PO TABS: 1.0000 | ORAL_TABLET | Freq: Every evening | ORAL | Status: DC | PRN

## 2023-06-12 MED ORDER — EZETIMIBE 10 MG PO TABS
10.0000 mg | ORAL_TABLET | Freq: Every day | ORAL | Status: DC
  Administered 2023-06-12 – 2023-06-13 (×2): 10 mg via ORAL
  Filled 2023-06-12 (×2): qty 1

## 2023-06-12 MED ORDER — INSULIN ASPART 100 UNIT/ML IJ SOLN
0.0000 [IU] | Freq: Every day | INTRAMUSCULAR | Status: DC
Start: 2023-06-12 — End: 2023-06-14

## 2023-06-12 MED ORDER — LEVOTHYROXINE SODIUM 50 MCG PO TABS
50.0000 ug | ORAL_TABLET | Freq: Every day | ORAL | Status: DC
Start: 2023-06-12 — End: 2023-06-14
  Administered 2023-06-12 – 2023-06-13 (×2): 50 ug via ORAL
  Filled 2023-06-12: qty 1

## 2023-06-12 MED ORDER — CLOPIDOGREL BISULFATE 75 MG PO TABS
75.0000 mg | ORAL_TABLET | Freq: Every day | ORAL | Status: DC
Start: 2023-06-12 — End: 2023-06-14
  Administered 2023-06-12 – 2023-06-13 (×2): 75 mg via ORAL
  Filled 2023-06-12 (×2): qty 1

## 2023-06-12 MED ORDER — ATORVASTATIN CALCIUM 10 MG PO TABS
20.0000 mg | ORAL_TABLET | Freq: Every day | ORAL | Status: DC
  Administered 2023-06-12 – 2023-06-13 (×2): 20 mg via ORAL
  Filled 2023-06-12 (×2): qty 2

## 2023-06-12 MED ORDER — STROKE: EARLY STAGES OF RECOVERY BOOK
Freq: Once | Status: DC
  Filled 2023-06-12: qty 1

## 2023-06-12 MED ORDER — ACETAMINOPHEN 160 MG/5ML PO SOLN: 650.0000 mg | ORAL | Status: DC | PRN

## 2023-06-12 MED ORDER — ACETAMINOPHEN 650 MG RE SUPP: 650.0000 mg | RECTAL | Status: DC | PRN

## 2023-06-12 MED ORDER — INSULIN ASPART 100 UNIT/ML IJ SOLN
0.0000 [IU] | Freq: Three times a day (TID) | INTRAMUSCULAR | Status: DC
Start: 2023-06-12 — End: 2023-06-14

## 2023-06-12 MED ORDER — ASPIRIN 81 MG PO TBEC
81.0000 mg | DELAYED_RELEASE_TABLET | Freq: Every day | ORAL | Status: DC
  Administered 2023-06-12 – 2023-06-13 (×2): 81 mg via ORAL
  Filled 2023-06-12 (×2): qty 1

## 2023-06-12 MED ORDER — LORAZEPAM 2 MG/ML IJ SOLN: 0.5000 mg | Freq: Once | INTRAMUSCULAR | Status: DC | PRN

## 2023-06-12 MED ORDER — ENOXAPARIN SODIUM 30 MG/0.3ML IJ SOSY
30.0000 mg | PREFILLED_SYRINGE | INTRAMUSCULAR | Status: DC
  Administered 2023-06-12 – 2023-06-13 (×2): 30 mg via SUBCUTANEOUS
  Filled 2023-06-12 (×2): qty 0.3

## 2023-06-12 NOTE — Evaluation (Addendum)
Physical Therapy Evaluation Patient Details Name: Leslie Ward MRN: [id number] DOB: 1931-06-15 Today's Date: 06/12/2023  History of Present Illness  89 y.o. female who presents with confusion and speech difficulty. CT head remote lacunar infarcts R basal ganglia and thalamus; no acute changes; CTA head and neck-occlusion distal M3 branch, mod to severe stenosis Rt vertebral PMH: cervical dystonia with head tremor, glaucoma, HTN, thyroid disease,  history of ischemic CVA, arthritis, incontinence, DM, vertigo  Clinical Impression   Pt admitted secondary to problem above with deficits below. PTA patient was living alone in a one level apartment with a level entry (it is an apartment attached to her son's home). She was using a quad cane for ambulation. She is normally fluent in [place], however is having word-finding problems and reverts to [place] frequently.  Pt currently requires min assist for bed mobility and ambulation with quad cane. She was steady with cane, however reaching for additional UE support. Will likely do well with RW. Anticipate she will be able to return home with HHPT, although also will need to consider OT and SLP input when they see the pt. Anticipate patient will benefit from PT to address problems listed below.Will continue to follow acutely to maximize functional mobility independence and safety.           If plan is discharge home, recommend the following: A little help with walking and/or transfers;Assistance with cooking/housework;Help with stairs or ramp for entrance   Can travel by private vehicle        Equipment Recommendations None recommended by PT  Recommendations for Other Services       Functional Status Assessment Patient has had a recent decline in their functional status and demonstrates the ability to make significant improvements in function in a reasonable and predictable amount of time.     Precautions / Restrictions Precautions Precautions:  Fall      Mobility  Bed Mobility Overal bed mobility: Needs Assistance Bed Mobility: Supine to Sit, Sit to Supine     Supine to sit: Min assist Sit to supine: Supervision   General bed mobility comments: required assist to fully raise torso as coming to sit EOB; no assist or cues needed for return to bed    Transfers Overall transfer level: Needs assistance Equipment used: Quad cane Transfers: Sit to/from Stand Sit to Stand: Contact guard assist           General transfer comment: CGA for safety with no imbalance noted    Ambulation/Gait Ambulation/Gait assistance: Min assist Gait Distance (Feet): 120 Feet Assistive device: Quad cane, 1 person hand held assist Gait Pattern/deviations: Step-through pattern, Decreased stride length Gait velocity: able to incr on command, but remains below average velocity Gait velocity interpretation: 1.31 - 2.62 ft/sec, indicative of limited community ambulator   General Gait Details: pt reaching for LUE support with cane in RUE;  steady with turns, stepping backwards, and increasing velocity  Stairs            Wheelchair Mobility     Tilt Bed    Modified Rankin (Stroke Patients Only) Modified Rankin (Stroke Patients Only) Pre-Morbid Rankin Score: No significant disability Modified Rankin: Moderately severe disability     Balance Overall balance assessment: Needs assistance Sitting-balance support: No upper extremity supported, Feet supported Sitting balance-Leahy Scale: Good     Standing balance support: Single extremity supported Standing balance-Leahy Scale: Fair Standing balance comment: very light UE support via quad cane; able to close eyes and count to 10  Pertinent Vitals/Pain Pain Assessment Pain Assessment: No/denies pain    Home Living Family/patient expects to be discharged to:: Private residence Living Arrangements: Alone Available Help at Discharge:  Family;Available PRN/intermittently Type of Home: Apartment Home Access: Level entry       Home Layout: One level Home Equipment: Agricultural consultant (2 wheels);Cane - quad;Grab bars - toilet;Grab bars - tub/shower Additional Comments: information from previous medical record    Prior Function Prior Level of Function : Independent/Modified Independent             Mobility Comments: walks with quad cane       Extremity/Trunk Assessment   Upper Extremity Assessment Upper Extremity Assessment: Defer to OT evaluation    Lower Extremity Assessment Lower Extremity Assessment: Generalized weakness    Cervical / Trunk Assessment Cervical / Trunk Assessment: Normal  Communication   Communication Communication: Difficulty communicating thoughts/reduced clarity of speech;Difficulty following commands/understanding Following commands: Follows one step commands inconsistently Cueing Techniques: Verbal cues;Gestural cues;Tactile cues  Cognition Arousal: Alert Behavior During Therapy: WFL for tasks assessed/performed Overall Cognitive Status: Difficult to assess                                 General Comments: pt normally fluent in [place] but struggling and using some Spanish        General Comments General comments (skin integrity, edema, etc.): Family member present to assist with translation when pt using Spanish words.    Exercises     Assessment/Plan    PT Assessment Patient needs continued PT services  PT Problem List Decreased strength;Decreased activity tolerance;Decreased balance;Decreased mobility;Decreased knowledge of use of DME;Decreased safety awareness;Decreased knowledge of precautions       PT Treatment Interventions DME instruction;Gait training;Stair training;Functional mobility training;Therapeutic activities;Therapeutic exercise;Balance training;Neuromuscular re-education;Patient/family education    PT Goals (Current goals can be found in  the Care Plan section)  Acute Rehab PT Goals Patient Stated Goal: none stated PT Goal Formulation: With patient Time For Goal Achievement: 06/26/23 Potential to Achieve Goals: Good    Frequency Min 1X/week     Co-evaluation               AM-PAC PT "6 Clicks" Mobility  Outcome Measure Help needed turning from your back to your side while in a flat bed without using bedrails?: None Help needed moving from lying on your back to sitting on the side of a flat bed without using bedrails?: A Little Help needed moving to and from a bed to a chair (including a wheelchair)?: A Little Help needed standing up from a chair using your arms (e.g., wheelchair or bedside chair)?: A Little Help needed to walk in hospital room?: A Little Help needed climbing 3-5 steps with a railing? : A Little 6 Click Score: 19    End of Session Equipment Utilized During Treatment: Gait belt Activity Tolerance: Patient tolerated treatment well Patient left: in bed;Other (comment) (RN and EKG tech present and prepping for EKG) Nurse Communication: Mobility status PT Visit Diagnosis: Other abnormalities of gait and mobility (R26.89)    Time: 4098-1191 PT Time Calculation (min) (ACUTE ONLY): 19 min   Charges:   PT Evaluation $PT Eval Low Complexity: 1 Low   PT General Charges $$ ACUTE PT VISIT: 1 Visit          Jerolyn Center, PT Acute Rehabilitation Services  Office [phone]   Zena Amos 06/12/2023, 10:07 AM

## 2023-06-12 NOTE — Progress Notes (Signed)
  Echocardiogram 2D Echocardiogram has been performed.  Leslie Ward 06/12/2023, 2:03 PM

## 2023-06-12 NOTE — Progress Notes (Signed)
SLP Cancellation Note  Patient Details Name: Leslie Ward MRN: [id number] DOB: Sep 20, 1930   Cancelled treatment:       Reason Eval/Treat Not Completed: Patient at procedure or test/unavailable. Pt working with OT. Will f/u    Laportia Carley, Riley Nearing 06/12/2023, 2:52 PM

## 2023-06-12 NOTE — Progress Notes (Addendum)
STROKE TEAM PROGRESS NOTE   BRIEF HPI [name] is a 89 y.o. female with history of hypertension, type 2 diabetes mellitus, hypothyroidism, and history of ischemic CVA, cervical dystonia, chronic neck tremor, glaucoma,   presenting with  new onset confusion and speech difficulty. [name]    SIGNIFICANT HOSPITAL EVENTS Mri with Small acute/subacute infarcts in the left sub insular region and posterior left temporal lobe.  INTERIM HISTORY/SUBJECTIVE Family friend at bedside. Patient sitting in bed in NAD. Patient does not remember what brought her in to the hospital. Mri with Small acute/subacute infarcts in the left sub insular region and posterior left temporal lobe.  Will likely need loop recorder   OBJECTIVE  CBC    Component Value Date/Time   WBC 8.7 06/12/2023 0424   RBC 4.55 06/12/2023 0424   HGB 13.1 06/12/2023 0424   HCT 40.2 06/12/2023 0424   PLT 309 06/12/2023 0424   MCV 88.4 06/12/2023 0424   MCH 28.8 06/12/2023 0424   MCHC 32.6 06/12/2023 0424   RDW 14.7 06/12/2023 0424   LYMPHSABS 3.3 04/10/2023 1113   MONOABS 0.4 04/10/2023 1113   EOSABS 0.1 04/10/2023 1113   BASOSABS 0.1 04/10/2023 1113    BMET    Component Value Date/Time   NA 138 06/12/2023 0424   NA 144 11/09/2020 1101   K 3.3 (L) 06/12/2023 0424   CL 105 06/12/2023 0424   CO2 24 06/12/2023 0424   GLUCOSE 94 06/12/2023 0424   BUN 15 06/12/2023 0424   BUN 25 11/09/2020 1101   CREATININE 0.76 06/12/2023 0424   CREATININE 0.93 12/18/2022 1545   CALCIUM 9.1 06/12/2023 0424   EGFR 58 (L) 12/18/2022 1545   EGFR 68 11/09/2020 1101   GFRNONAA >60 06/12/2023 0424    IMAGING past 24 hours MR BRAIN WO CONTRAST  Result Date: 06/12/2023 CLINICAL DATA:  Neuro deficit, acute, stroke suspected. EXAM: MRI HEAD WITHOUT CONTRAST TECHNIQUE: Multiplanar, multiecho pulse sequences of the brain and surrounding structures were obtained without intravenous contrast. COMPARISON:  Head CT June 11, 2019.  FINDINGS: Brain: Small foci of restricted diffusion are seen in the left sub insular region and posterior left temporal lobe consistent with acute/subacute infarcts. No acute hemorrhage, hydrocephalus, extra-axial collection or mass lesion. Remote infarcts in the left occipital lobe, thalami and bilateral cerebellar hemispheres. Scattered and confluent foci of T2 hyperintensity are seen within the white matter of the cerebral hemispheres and within the pons, nonspecific, most likely related to chronic small vessel ischemia. Vascular: Major intracranial flow voids are patent at the skull base. Skull and upper cervical spine: Posterior disc protrusion at C3-4 causes at least moderate spinal canal stenosis. No focal marrow lesion identified. Sinuses/Orbits: Bilateral lens surgery. Mucosal thickening of the bilateral sphenoid sinuses with small fluid level on the right. Mild bilateral mastoid effusion. Other: None. IMPRESSION: 1. Small acute/subacute infarcts in the left sub insular region and posterior left temporal lobe. 2. Remote infarcts in the left occipital lobe, thalami and bilateral cerebellar hemispheres. 3. Advanced chronic microvascular ischemic changes of the white matter. 4. At least moderate spinal canal stenosis at C3-4. Electronically Signed   By: Baldemar Lenis M.D.   On: 06/12/2023 12:26   CT ANGIO HEAD NECK W WO CM  Result Date: 06/11/2023 CLINICAL DATA:  Initial evaluation for neuro deficit, stroke suspected. Altered mental status. EXAM: CT ANGIOGRAPHY HEAD AND NECK WITH AND WITHOUT CONTRAST TECHNIQUE: Multidetector CT imaging of the head and neck was performed using the standard protocol  during bolus administration of intravenous contrast. Multiplanar CT image reconstructions and MIPs were obtained to evaluate the vascular anatomy. Carotid stenosis measurements (when applicable) are obtained utilizing NASCET criteria, using the distal internal carotid diameter as the denominator.  RADIATION DOSE REDUCTION: This exam was performed according to the departmental dose-optimization program which includes automated exposure control, adjustment of the mA and/or kV according to patient size and/or use of iterative reconstruction technique. CONTRAST:  75mL OMNIPAQUE IOHEXOL 350 MG/ML SOLN COMPARISON:  Prior study from 10/10/2021 and 10/09/2021. FINDINGS: CT HEAD FINDINGS Brain: Generalized age-related cerebral atrophy. Patchy and confluent hypodensity involving the supratentorial cerebral white matter, most likely reflecting advanced chronic microvascular ischemic disease. Few remote lacunar infarcts present about the right basal ganglia and right thalamus. Small remote right cerebellar infarcts. Chronic left occipital infarct. No acute intracranial hemorrhage. No acute large vessel territory infarct. No mass lesion or midline shift. No hydrocephalus or extra-axial fluid collection. Vascular: No abnormal hyperdense vessel. Scattered vascular calcifications noted within the carotid siphons. Skull: Scalp soft tissues and calvarium within normal limits. Sinuses/Orbits: Globes and orbital soft tissues within normal limits. Paranasal sinuses are largely clear. No significant mastoid effusion. Other: None. Review of the MIP images confirms the above findings CTA NECK FINDINGS Aortic arch: Visualized aortic arch within normal limits for caliber. Aberrant right subclavian artery noted. Atheromatous change about the arch itself. No high-grade stenosis about the origin the great vessels. Right carotid system: Right common and internal carotid arteries are tortuous but patent without significant stenosis or dissection. Left carotid system: Left common and internal carotid arteries are tortuous but patent without significant stenosis or dissection. Vertebral arteries: Both vertebral arteries arise from the subclavian arteries. Left V1 and V2 segments are diminutive and irregular but remain patent, progressed as  compared to prior study from 10/09/2021. No visible raised dissection flap or other acute finding. Left V3 and V4 segments are supplied via muscular collaterals and are more normal in caliber. Right vertebral artery mildly irregular and hypoplastic but remains patent within the neck without dissection or hemodynamically significant stenosis. Skeleton: No discrete or worrisome osseous lesions. Other neck: No other acute finding. Upper chest: No other acute finding. Review of the MIP images confirms the above findings CTA HEAD FINDINGS Anterior circulation: Atheromatous change about the carotid siphons with associated mild multifocal narrowing on the right. A1 segments patent bilaterally. Normal anterior communicating artery complex. Anterior cerebral arteries patent without significant stenosis. No M1 stenosis or occlusion. Right MCA branches patent and well perfused. On the left, there is occlusion of a distal left M3 branch, inferior division, likely acute (series 605, image 84). Remainder of the left MCA branches remain patent perfused. Posterior circulation: Dominant left V4 segment widely patent. Moderate to severe atheromatous stenosis about the right vertebral artery as it crosses into the cranial vault. Right V4 segment otherwise patent. Both PICA grossly patent at their origins. Basilar patent without significant stenosis. Superior cerebellar and posterior cerebral arteries patent bilaterally. Venous sinuses: Grossly patent allowing for timing the contrast bolus. Anatomic variants: As above.  No aneurysm. Review of the MIP images confirms the above findings IMPRESSION: CT HEAD: 1. No acute intracranial abnormality. 2. Age-related cerebral atrophy with advanced chronic microvascular ischemic disease, with multiple remote infarcts as above. CTA HEAD AND NECK: 1. Occlusion of a distal left M3 branch, inferior division, likely acute. No visible evolving ischemic changes by CT. 2. Diminutive and irregular left V1  and V2 segments, progressed as compared to prior study from 10/09/2021.  No visible raised dissection flap or other acute finding. 3. Moderate to severe atheromatous stenosis about the right vertebral artery as it crosses into the cranial vault. 4. Diffuse tortuosity about the major arterial vasculature of the head and neck, suggesting chronic underlying hypertension. 5. Aberrant right subclavian artery. 6.  Aortic Atherosclerosis (ICD10-I70.0). Results were called by telephone at the time of interpretation on 06/11/2023 at 11:35 pm to provider [facility] , who verbally acknowledged these results. Electronically Signed   By: Rise Mu M.D.   On: 06/11/2023 23:39   DG Chest Portable 1 View  Result Date: 06/11/2023 CLINICAL DATA:  Altered mental status EXAM: PORTABLE CHEST 1 VIEW COMPARISON:  10/09/2021 FINDINGS: Stable cardiomediastinal silhouette. Aortic atherosclerotic calcification. No focal consolidation, pleural effusion, or pneumothorax. No displaced rib fractures. IMPRESSION: No acute cardiopulmonary disease. Electronically Signed   By: Minerva Fester M.D.   On: 06/11/2023 22:09    Vitals:   06/12/23 0800 06/12/23 0900 06/12/23 1000 06/12/23 1250  BP:    (!) 87/66  Pulse: 69 64 67 73  Resp: 20 (!) 21 (!) 21 (!) 21  Temp:    98.3 F (36.8 C)  TempSrc:    Oral  SpO2: 93% 92% 95%   Weight:      Height:         PHYSICAL EXAM General:  Alert, well-nourished, well-developed patient in no acute distress Psych:  Mood and affect appropriate for situation CV: Regular rate and rhythm on monitor Respiratory:  Regular, unlabored respirations on room air GI: Abdomen soft and nontender   NEURO:  Mental Status: AA&Ox3, patient is able to give clear and coherent history Speech/Language: speech is without dysarthria or aphasia.  Naming, repetition, fluency, and comprehension intact.  Cranial Nerves:  II: PERRL. Visual fields full.  III, IV, VI: EOMI. Eyelids elevate  symmetrically.  V: Sensation is intact to light touch and symmetrical to face.  VII: Face is symmetrical resting and smiling VIII: hearing intact to voice. IX, X: Palate elevates symmetrically. Phonation is normal.  ZO:[id number] shrug 5/5. XII: tongue is midline without fasciculations. Motor: 5/5 strength to all muscle groups tested.  Tone: is normal and bulk is normal Sensation- Intact to light touch bilaterally. Extinction absent to light touch to DSS.   Coordination: FTN intact bilaterally, HKS: no ataxia in BLE.No drift.  Gait- deferred  ASSESSMENT/PLAN  Acute Ischemic Infarct: left MCA territory 2 small infarcts, etiology: Concerning for cardio embolic source CT head No acute abnormality.   CTA head & neck Occlusion of a distal left M3 branch, inferior division, Diminutive and irregular left V1 and V2 segments, Moderate to severe atheromatous stenosis about the right vertebral artery MRI  Small acute/subacute infarcts in the left sub insular region and posterior left temporal lobe. Remote infarcts in the left occipital lobe, thalami and bilateral cerebellar hemispheres. 2D Echo EF 60 to 65% LE venous Doppler pending Will likely need a loop recorder at discharge, pt would like to discuss with her son and brother LDL 150 HgbA1c 6.1 VTE prophylaxis - lovenox aspirin 81 mg daily prior to admission, now on aspirin 81 mg daily and clopidogrel 75 mg daily for 3 weeks and then plavix  alone. Therapy recommendations: Home health PT Disposition:  pending  Hx of Stroke/TIA March 2023 admitted for right cerebellum and left thalamic infarcts.  CTA head and neck showed moderate bilateral VA stenosis.  EF 70 to 75%.  LDL 127, A1c 6.0.  Discharged on DAPT and Zetia.  Hypertension Home  meds: Amlodipine 5 mg, metoprolol 50 mg, Micardis 20 mg Stable Avoid low BP Long-term BP goal normotensive  Hyperlipidemia Home meds: Zetia 10 mg, resumed in hospital LDL 150, goal < 70 Add lipitor 20 No  high intensity of statin due to hx of statin intolerance Continue statin and Zetia at discharge  Other Stroke Risk Factors Advanced age  Other Active Problems Glaucoma History of BPPV Hypothyroidism-levothyroxine 50 mcg Cervical dystonia follow-up with Dr. Terrace Arabia at [facility] RLS Status post bladder stimulator  Hospital day # 0  Gevena Mart DNP, ACNPC-AG  [facility]  ATTENDING NOTE: I reviewed above note and agree with the assessment and plan. Pt was seen and examined.   A female family friend at bedside.  She also serves as Equities trader.  Patient and speak some English but Spanish is a native language.  Patient lying bed, no distress.  She is awake, alert, eyes open, mild to moderate expressive aphasia, word finding  difficulty, however able to follow simple commands.. Able to name 2/4and not able to repeat full sentence. No gaze palsy, tracking bilaterally, visual field full. No facial droop so. Tongue midline. Bilateral UEs 4/5, no drift. Bilaterally LEs 4/5, no drift. Sensation symmetrical bilaterally, no sensory neglect, b/l FTN intact although slow, gait not tested.   Patient had a stroke in 10/2021 involving right cerebellum and left thalamus, concerning for cardioembolic source.  This admission patient also had 2 separate infarcts within left MCA territory, again concerning for cardioembolic source.  EF 60 to 65%, currently LE venous Doppler pending.  Planning for loop recorder to rule out A-fib, patient would like to discuss with his son and brother.  Continue DAPT and Zetia, and low-dose statin.  Will follow  For detailed assessment and plan, please refer to above/below as I have made changes wherever appropriate.   Marvel Plan, MD PhD Stroke Neurology 06/12/2023 3:54 PM  I discussed with Dr. Randol Kern. I spent additional 30 inpatient minutes face-to-face time with the patient, more than 50% of which was spent in counseling and coordination of care, reviewing test results,  images and medication, and discussing the diagnosis, treatment plan and potential prognosis. This patient's care requiresreview of multiple databases, neurological assessment, discussion with family, other specialists and medical decision making of high complexity.         To contact Stroke Continuity provider, please refer to [email or web address]. After hours, contact General Neurology

## 2023-06-12 NOTE — Progress Notes (Signed)
PROGRESS NOTE    Leslie Ward  UVO:[id number] DOB: [birth year] DOA: 06/11/2023 PCP: [name], PA-C   Chief Complaint  Patient presents with   Altered Mental Status    Brief Narrative:   Leslie Ward is a 89 y.o. female with medical history significant for hypertension, type 2 diabetes mellitus, hypothyroidism, and history of ischemic CVA who presents with confusion and speech difficulty.   Patient seemed to be in her usual state when she was speaking on the phone with her son yesterday evening, but he noted her to be confused when he spoke with her by phone again at approximately 10 AM today.  A family friend went to check on her, noted that she is typically conversant and oriented, but was not in her usual state.     [facility] ED Course: Upon arrival to the ED, patient is found to be afebrile and saturating well on room air with normal heart rate and stable blood pressure.  Chemistry panel notable for elevated BUN to creatinine ratio.  CBC is unremarkable.  No acute finding is noted on head CT.   Neurology (Dr. Otelia Limes) was consulted by the ED physician and the patient was transferred to [facility] for further evaluation and management.  Please see discussion below   Assessment & Plan:   Principal Problem:   Acute encephalopathy Active Problems:   Hypertension   Type 2 diabetes mellitus (HCC)   Hypothyroidism (acquired)  Acute onset of confusion and difficulty speaking-aphasia -Neurology input greatly appreciated concern for acute CVA, will obtain MRI head - CTA head and neck significant for atherosclerosis disease, mainly significant for occlusion of the distal left M3 branch, inferior division, likely acute, and moderate to severe atheromatosis stenosis about the right vertebral artery. -Will obtain 2D echo. -  continue to monitor on telemetry. -A1C is acceptable at 6.1 -LDL is elevated at 150, intolerant to Crestor in the past, will try low-dose  Lipitor -Will consult PT/OT/SLP -she remains on aspirin, will await further recommendation from neurology regarding antiplatelet therapy(MRI findings, as if it confirms stroke that likely will add Plavix)  Type II DM  - A1c was 6.2% in August 2024  - Check CBGs and use low-intensity SSI for now     Hypothyroidism  - Continue Synthroid     Hx of CVA  - Continue daily ASA 81    Hypertension  - Permit HTN for now        DVT prophylaxis: Lovenox Code Status: Full Family Communication: Family friend at bedside, Disposition:   Status is: Inpatient    Consultants:  neurology   Subjective:  No significant events overnight as discussed with staff, she still reports problem with speech, receptive and expressive aphasia  Objective: Vitals:   06/12/23 0447 06/12/23 0800 06/12/23 0900 06/12/23 1000  BP:      Pulse:  69 64 67  Resp:  20 (!) 21 (!) 21  Temp:      TempSrc:      SpO2:  93% 92% 95%  Weight: 55 kg     Height: 4\' 9"  (1.448 m)      No intake or output data in the 24 hours ending 06/12/23 1034 Filed Weights   06/12/23 0447  Weight: 55 kg    Examination:  Awake Alert, Oriented pleasant, no apparent distress she is oriented to self, location, but unable to give exact date including day, month and year Symmetrical Chest wall movement, Good air movement bilaterally, CTAB RRR,No Gallops,Rubs or new  Murmurs, No Parasternal Heave +ve B.Sounds, Abd Soft, No tenderness, No rebound - guarding or rigidity. No Cyanosis, Clubbing or edema, No new Rash or bruise      Data Reviewed: I have personally reviewed following labs and imaging studies  CBC: Recent Labs  Lab 06/11/23 1942 06/12/23 0424  WBC 10.5 8.7  HGB 13.2 13.1  HCT 40.7 40.2  MCV 88.5 88.4  PLT 313 309    Basic Metabolic Panel: Recent Labs  Lab 06/11/23 1942 06/12/23 0424  NA 140 138  K 3.9 3.3*  CL 99 105  CO2 27 24  GLUCOSE 119* 94  BUN 24* 15  CREATININE 0.80 0.76  CALCIUM 9.5 9.1     GFR: Estimated Creatinine Clearance: 33.4 mL/min (by C-G formula based on SCr of 0.76 mg/dL).  Liver Function Tests: Recent Labs  Lab 06/11/23 1942  AST 15  ALT 11  ALKPHOS 68  BILITOT 1.0  PROT 7.6  ALBUMIN 3.6    CBG: Recent Labs  Lab 06/11/23 1912  GLUCAP 141*     Recent Results (from the past 240 hour(s))  Resp panel by RT-PCR (RSV, Flu A&B, Covid) Anterior Nasal Swab     Status: None   Collection Time: 06/11/23  8:17 PM   Specimen: Anterior Nasal Swab  Result Value Ref Range Status   SARS Coronavirus 2 by RT PCR NEGATIVE NEGATIVE Final    Comment: (NOTE) SARS-CoV-2 target nucleic acids are NOT DETECTED.  The SARS-CoV-2 RNA is generally detectable in upper respiratory specimens during the acute phase of infection. The lowest concentration of SARS-CoV-2 viral copies this assay can detect is 138 copies/mL. A negative result does not preclude SARS-Cov-2 infection and should not be used as the sole basis for treatment or other patient management decisions. A negative result may occur with  improper specimen collection/handling, submission of specimen other than nasopharyngeal swab, presence of viral mutation(s) within the areas targeted by this assay, and inadequate number of viral copies(<138 copies/mL). A negative result must be combined with clinical observations, patient history, and epidemiological information. The expected result is Negative.  Fact Sheet for Patients:  BloggerCourse.com  Fact Sheet for Healthcare Providers:  SeriousBroker.it  This test is no t yet approved or cleared by the [place] FDA and  has been authorized for detection and/or diagnosis of SARS-CoV-2 by FDA under an Emergency Use Authorization (EUA). This EUA will remain  in effect (meaning this test can be used) for the duration of the COVID-19 declaration under Section 564(b)(1) of the Act, 21 U.S.C.section 360bbb-3(b)(1),  unless the authorization is terminated  or revoked sooner.       Influenza A by PCR NEGATIVE NEGATIVE Final   Influenza B by PCR NEGATIVE NEGATIVE Final    Comment: (NOTE) The Xpert Xpress SARS-CoV-2/FLU/RSV plus assay is intended as an aid in the diagnosis of influenza from Nasopharyngeal swab specimens and should not be used as a sole basis for treatment. Nasal washings and aspirates are unacceptable for Xpert Xpress SARS-CoV-2/FLU/RSV testing.  Fact Sheet for Patients: BloggerCourse.com  Fact Sheet for Healthcare Providers: SeriousBroker.it  This test is not yet approved or cleared by the [place] FDA and has been authorized for detection and/or diagnosis of SARS-CoV-2 by FDA under an Emergency Use Authorization (EUA). This EUA will remain in effect (meaning this test can be used) for the duration of the COVID-19 declaration under Section 564(b)(1) of the Act, 21 U.S.C. section 360bbb-3(b)(1), unless the authorization is terminated or revoked.  Resp Syncytial Virus by PCR NEGATIVE NEGATIVE Final    Comment: (NOTE) Fact Sheet for Patients: BloggerCourse.com  Fact Sheet for Healthcare Providers: SeriousBroker.it  This test is not yet approved or cleared by the [place] FDA and has been authorized for detection and/or diagnosis of SARS-CoV-2 by FDA under an Emergency Use Authorization (EUA). This EUA will remain in effect (meaning this test can be used) for the duration of the COVID-19 declaration under Section 564(b)(1) of the Act, 21 U.S.C. section 360bbb-3(b)(1), unless the authorization is terminated or revoked.  Performed at [facility], [address] [place], [place] [address]          Radiology Studies: CT ANGIO HEAD NECK W WO CM  Result Date: 06/11/2023 CLINICAL DATA:  Initial evaluation for neuro deficit, stroke suspected.  Altered mental status. EXAM: CT ANGIOGRAPHY HEAD AND NECK WITH AND WITHOUT CONTRAST TECHNIQUE: Multidetector CT imaging of the head and neck was performed using the standard protocol during bolus administration of intravenous contrast. Multiplanar CT image reconstructions and MIPs were obtained to evaluate the vascular anatomy. Carotid stenosis measurements (when applicable) are obtained utilizing NASCET criteria, using the distal internal carotid diameter as the denominator. RADIATION DOSE REDUCTION: This exam was performed according to the departmental dose-optimization program which includes automated exposure control, adjustment of the mA and/or kV according to patient size and/or use of iterative reconstruction technique. CONTRAST:  75mL OMNIPAQUE IOHEXOL 350 MG/ML SOLN COMPARISON:  Prior study from 10/10/2021 and 10/09/2021. FINDINGS: CT HEAD FINDINGS Brain: Generalized age-related cerebral atrophy. Patchy and confluent hypodensity involving the supratentorial cerebral white matter, most likely reflecting advanced chronic microvascular ischemic disease. Few remote lacunar infarcts present about the right basal ganglia and right thalamus. Small remote right cerebellar infarcts. Chronic left occipital infarct. No acute intracranial hemorrhage. No acute large vessel territory infarct. No mass lesion or midline shift. No hydrocephalus or extra-axial fluid collection. Vascular: No abnormal hyperdense vessel. Scattered vascular calcifications noted within the carotid siphons. Skull: Scalp soft tissues and calvarium within normal limits. Sinuses/Orbits: Globes and orbital soft tissues within normal limits. Paranasal sinuses are largely clear. No significant mastoid effusion. Other: None. Review of the MIP images confirms the above findings CTA NECK FINDINGS Aortic arch: Visualized aortic arch within normal limits for caliber. Aberrant right subclavian artery noted. Atheromatous change about the arch itself. No  high-grade stenosis about the origin the great vessels. Right carotid system: Right common and internal carotid arteries are tortuous but patent without significant stenosis or dissection. Left carotid system: Left common and internal carotid arteries are tortuous but patent without significant stenosis or dissection. Vertebral arteries: Both vertebral arteries arise from the subclavian arteries. Left V1 and V2 segments are diminutive and irregular but remain patent, progressed as compared to prior study from 10/09/2021. No visible raised dissection flap or other acute finding. Left V3 and V4 segments are supplied via muscular collaterals and are more normal in caliber. Right vertebral artery mildly irregular and hypoplastic but remains patent within the neck without dissection or hemodynamically significant stenosis. Skeleton: No discrete or worrisome osseous lesions. Other neck: No other acute finding. Upper chest: No other acute finding. Review of the MIP images confirms the above findings CTA HEAD FINDINGS Anterior circulation: Atheromatous change about the carotid siphons with associated mild multifocal narrowing on the right. A1 segments patent bilaterally. Normal anterior communicating artery complex. Anterior cerebral arteries patent without significant stenosis. No M1 stenosis or occlusion. Right MCA branches patent and well perfused. On the  left, there is occlusion of a distal left M3 branch, inferior division, likely acute (series 605, image 84). Remainder of the left MCA branches remain patent perfused. Posterior circulation: Dominant left V4 segment widely patent. Moderate to severe atheromatous stenosis about the right vertebral artery as it crosses into the cranial vault. Right V4 segment otherwise patent. Both PICA grossly patent at their origins. Basilar patent without significant stenosis. Superior cerebellar and posterior cerebral arteries patent bilaterally. Venous sinuses: Grossly patent  allowing for timing the contrast bolus. Anatomic variants: As above.  No aneurysm. Review of the MIP images confirms the above findings IMPRESSION: CT HEAD: 1. No acute intracranial abnormality. 2. Age-related cerebral atrophy with advanced chronic microvascular ischemic disease, with multiple remote infarcts as above. CTA HEAD AND NECK: 1. Occlusion of a distal left M3 branch, inferior division, likely acute. No visible evolving ischemic changes by CT. 2. Diminutive and irregular left V1 and V2 segments, progressed as compared to prior study from 10/09/2021. No visible raised dissection flap or other acute finding. 3. Moderate to severe atheromatous stenosis about the right vertebral artery as it crosses into the cranial vault. 4. Diffuse tortuosity about the major arterial vasculature of the head and neck, suggesting chronic underlying hypertension. 5. Aberrant right subclavian artery. 6.  Aortic Atherosclerosis (ICD10-I70.0). Results were called by telephone at the time of interpretation on 06/11/2023 at 11:35 pm to provider [facility] , who verbally acknowledged these results. Electronically Signed   By: Rise Mu M.D.   On: 06/11/2023 23:39   DG Chest Portable 1 View  Result Date: 06/11/2023 CLINICAL DATA:  Altered mental status EXAM: PORTABLE CHEST 1 VIEW COMPARISON:  10/09/2021 FINDINGS: Stable cardiomediastinal silhouette. Aortic atherosclerotic calcification. No focal consolidation, pleural effusion, or pneumothorax. No displaced rib fractures. IMPRESSION: No acute cardiopulmonary disease. Electronically Signed   By: Minerva Fester M.D.   On: 06/11/2023 22:09        Scheduled Meds:  [START ON 06/13/2023]  stroke: early stages of recovery book   Does not apply Once   aspirin EC  81 mg Oral Daily   enoxaparin (LOVENOX) injection  30 mg Subcutaneous Q24H   insulin aspart  0-5 Units Subcutaneous QHS   insulin aspart  0-6 Units Subcutaneous TID WC   levothyroxine  50 mcg Oral QAC  breakfast   Continuous Infusions:  sodium chloride       LOS: 0 days       Huey Bienenstock, MD [facility]   To contact the attending provider between 7A-7P or the covering provider during after hours 7P-7A, please log into the web site [email or web address] and access using universal [place] password for that web site. If you do not have the password, please call the hospital operator.  06/12/2023, 10:34 AM

## 2023-06-12 NOTE — Evaluation (Signed)
Occupational Therapy Evaluation Patient Details Name: Leslie Ward MRN: [id number] DOB: [birth year] Today's Date: 06/12/2023   History of Present Illness 89 y.o. female who presents with confusion and speech difficulty. CT head remote lacunar infarcts R basal ganglia and thalamus; no acute changes; CTA head and neck-occlusion distal M3 branch, mod to severe stenosis Rt vertebral PMH: cervical dystonia with head tremor, glaucoma, HTN, thyroid disease,  history of ischemic CVA, arthritis, incontinence, DM, vertigo   Clinical Impression   Pt admitted for above, presents with STM deficits to which her family reports his new. Pt completing her ADLs well with CGA to supervision assist. Educated family on the use of strategies such as calendars and schedules for pt to recall important tasks such as taking medication. Pt ambulating with CGA using quad cane and RW, pt family reports her gait looks back to baseline. Pt would benefit form continued acute skilled OT services to address deficits and help transition to next level of care. Patient would benefit from post acute Home OT services to help maximize functional independence in natural environment       If plan is discharge home, recommend the following: Supervision due to cognitive status;Direct supervision/assist for medications management    Functional Status Assessment  Patient has had a recent decline in their functional status and demonstrates the ability to make significant improvements in function in a reasonable and predictable amount of time.  Equipment Recommendations  None recommended by OT    Recommendations for Other Services       Precautions / Restrictions Precautions Precautions: Fall Restrictions Weight Bearing Restrictions: No      Mobility Bed Mobility Overal bed mobility: Needs Assistance Bed Mobility: Supine to Sit, Sit to Supine     Supine to sit: Min assist Sit to supine: Supervision   General bed  mobility comments: required assist to fully raise torso as coming to sit EOB; no assist or cues needed for return to bed    Transfers Overall transfer level: Needs assistance Equipment used: Quad cane, Rolling walker (2 wheels) Transfers: Sit to/from Stand Sit to Stand: Contact guard assist           General transfer comment: CGA for safety with both      Balance Overall balance assessment: Needs assistance Sitting-balance support: No upper extremity supported, Feet supported Sitting balance-Leahy Scale: Good     Standing balance support: Single extremity supported Standing balance-Leahy Scale: Fair                             ADL either performed or assessed with clinical judgement   ADL Overall ADL's : Needs assistance/impaired Eating/Feeding: Independent;Sitting   Grooming: Standing;Contact guard assist   Upper Body Bathing: Sitting;Set up   Lower Body Bathing: Sitting/lateral leans;Set up;Supervison/ safety   Upper Body Dressing : Sitting;Set up;Supervision/safety   Lower Body Dressing: Sitting/lateral leans;Set up;Supervision/safety   Toilet Transfer: Contact guard assist;Rolling walker (2 wheels)   Toileting- Clothing Manipulation and Hygiene: Contact guard assist       Functional mobility during ADLs: Contact guard assist;Rolling walker (2 wheels);Cane General ADL Comments: pt ambulated around room/hall with RW and with her quad cane. Balance not displaying significant difference with either DME. Family reports her gait appears to be at baseline     Vision   Vision Assessment?: Yes Eye Alignment: Within Functional Limits Ocular Range of Motion: Within Functional Limits Alignment/Gaze Preference: Within Defined Limits Tracking/Visual Pursuits: Able to track  stimulus in all quads without difficulty Additional Comments: Pt son reports she has glaucoma, pt notes spots in eyes. Pt son reports he is aware and following up on it. Pt denies  diplopia, reading clock around room without challenge     Perception         Praxis         Pertinent Vitals/Pain Pain Assessment Pain Assessment: No/denies pain     Extremity/Trunk Assessment Upper Extremity Assessment Upper Extremity Assessment: Generalized weakness   Lower Extremity Assessment Lower Extremity Assessment: Generalized weakness   Cervical / Trunk Assessment Cervical / Trunk Assessment: Normal   Communication Communication Communication: Difficulty following commands/understanding;Difficulty communicating thoughts/reduced clarity of speech (difficulty with English) Following commands: Follows one step commands with increased time Cueing Techniques: Verbal cues;Tactile cues;Gestural cues   Cognition Arousal: Alert Behavior During Therapy: WFL for tasks assessed/performed Overall Cognitive Status: Impaired/Different from baseline Area of Impairment: Memory, Problem solving                     Memory: Decreased short-term memory       Problem Solving: Slow processing General Comments: Pt not fluent in english, translator used. Pt displaying impaired STM based on small exerpts from SBT questioning. Pt was only asked to recall the name of the person then to note the time and count from 1-20 (She struggled to count backwards). Family reports this is not here baseline, some trouble with recalling names as well     General Comments  Family present and translating. bp stable    Exercises     Shoulder Instructions      Home Living Family/patient expects to be discharged to:: Private residence Living Arrangements: Alone Available Help at Discharge: Family;Available PRN/intermittently Type of Home: Apartment (mother-in-law suite attached to son's home) Home Access: Level entry     Home Layout: One level     Bathroom Shower/Tub: Producer, television/film/video: Standard     Home Equipment: Agricultural consultant (2 wheels);Cane - quad;Grab bars -  toilet;Grab bars - tub/shower;Shower seat   Additional Comments: Pt denies any falls      Prior Functioning/Environment Prior Level of Function : Independent/Modified Independent             Mobility Comments: walks with quad cane ADLs Comments: ind, son assorts medicine        OT Problem List: Impaired balance (sitting and/or standing);Decreased strength;Decreased cognition;Other (comment) (impaired memory)      OT Treatment/Interventions: Self-care/ADL training;Therapeutic exercise;Therapeutic activities;Balance training;Cognitive remediation/compensation;Patient/family education    OT Goals(Current goals can be found in the care plan section) Acute Rehab OT Goals Patient Stated Goal: To go home OT Goal Formulation: With family Time For Goal Achievement: 06/26/23 Potential to Achieve Goals: Good ADL Goals Pt Will Perform Grooming: with modified independence;standing Pt Will Transfer to Toilet: with supervision;ambulating Pt Will Perform Tub/Shower Transfer: Shower transfer;ambulating;with supervision Additional ADL Goal #1: Pt will recall 3/3 items to gather in preparation to complete ADLs.  OT Frequency: Min 1X/week    Co-evaluation              AM-PAC OT "6 Clicks" Daily Activity     Outcome Measure Help from another person eating meals?: None Help from another person taking care of personal grooming?: A Little Help from another person toileting, which includes using toliet, bedpan, or urinal?: A Little Help from another person bathing (including washing, rinsing, drying)?: A Little Help from another person to put on and taking  off regular upper body clothing?: A Little Help from another person to put on and taking off regular lower body clothing?: A Little 6 Click Score: 19   End of Session Equipment Utilized During Treatment: Gait belt;Rolling walker (2 wheels);Other (comment) (quad cane) Nurse Communication: Mobility status  Activity Tolerance: Patient  tolerated treatment well Patient left: in bed;with call bell/phone within reach;with family/visitor present;with bed alarm set  OT Visit Diagnosis: Unsteadiness on feet (R26.81);Muscle weakness (generalized) (M62.81);Other symptoms and signs involving cognitive function                Time: 1444-1530 OT Time Calculation (min): 46 min Charges:  OT General Charges $OT Visit: 1 Visit OT Evaluation $OT Eval Moderate Complexity: 1 Mod OT Treatments $Self Care/Home Management : 8-22 mins $Therapeutic Activity: 8-22 mins  06/12/2023  AB, OTR/L  Acute Rehabilitation Services  Office: [phone]   Tristan Schroeder 06/12/2023, 5:14 PM

## 2023-06-12 NOTE — Progress Notes (Signed)
   06/12/23 1453  TOC Brief Assessment  Insurance and Status Reviewed Administrator MedicareHMO/PPO)  Patient has primary care physician Yes (Saguier, Ramon Dredge, PA-C)  Home environment has been reviewed From home  Lives in apartment attached to [employer] house  Prior level of function: independent  Prior/Current Home Services No current home services  Social Determinants of Health Reivew SDOH reviewed no interventions necessary  Readmission risk has been reviewed Yes (9%)  Transition of care needs transition of care needs identified, TOC will continue to follow   Patient from home - HHPT has been recommended  OT and SLP evals are pending  [facility] has accepted - Orders will need to be placed  AVS updated

## 2023-06-12 NOTE — ED Notes (Signed)
Report given to [facility] RN @ [facility]

## 2023-06-12 NOTE — H&P (Addendum)
History and Physical    [name]:[id number] DOB: 09-Oct-1930 DOA: 06/11/2023  PCP: [name], PA-C   Patient coming from: Home   Chief Complaint: Speech difficulty, confusion   HPI: Leslie Ward is a 89 y.o. female with medical history significant for hypertension, type 2 diabetes mellitus, hypothyroidism, and history of ischemic CVA who presents with confusion and speech difficulty.  Patient seemed to be in her usual state when she was speaking on the phone with her son yesterday evening, but he noted her to be confused when he spoke with her by phone again at approximately 10 AM today.  A family friend went to check on her, noted that she is typically conversant and oriented, but was not in her usual state.    [facility] ED Course: Upon arrival to the ED, patient is found to be afebrile and saturating well on room air with normal heart rate and stable blood pressure.  Chemistry panel notable for elevated BUN to creatinine ratio.  CBC is unremarkable.  No acute finding is noted on head CT.  Neurology (Dr. Otelia Limes) was consulted by the ED physician and the patient was transferred to [facility] for further evaluation and management.  Review of Systems:  ROS limited by patient's clinical condition.  Past Medical History:  Diagnosis Date   Adhesive capsulitis of left shoulder 12/22/2018   Bilateral shoulder pain 08/24/2021   Cerebrovascular accident (CVA) (HCC) 10/25/2021   Cervical dystonia 03/08/2019   Xeomin approved diagnosis.   COVID 05/29/2021   Dystonia    Edema 02/11/2019   Ganglion cyst 08/25/2020   Glaucoma    Hyperlipidemia 02/11/2019   Hypertension    Hypothyroidism (acquired) 01/19/2019   Inflammatory arthritis 08/24/2021   Mixed incontinence 03/15/2020   Formatting of this note might be different from the original.  Added automatically from request for surgery [id number]   Neck pain 01/19/2019   Post-COVID chronic fatigue 05/29/2021    Post-COVID chronic joint pain 05/29/2021   Post-COVID chronic muscle pain 05/29/2021   Primary open angle glaucoma (POAG) of both eyes, moderate stage 09/12/2021   Sedimentation rate elevation 08/24/2021   Stroke (HCC) 10/2021   Syncope 10/09/2021   Thyroid disease    Type 2 diabetes mellitus (HCC) 01/19/2019   Urgency incontinence 03/15/2020   Formatting of this note might be different from the original.  Added automatically from request for surgery [id number]   Urinary frequency 08/24/2021   Vertigo 12/01/2019    Past Surgical History:  Procedure Laterality Date   NO PAST SURGERIES      Social History:   reports that she has never smoked. She has never used smokeless tobacco. She reports that she does not drink alcohol and does not use drugs.  Allergies  Allergen Reactions   Crestor [Rosuvastatin Calcium] Other (See Comments)    "caused me to go into kidney failure"    Family History  Problem Relation Age of Onset   Stroke Mother    Heart attack Father    Heart Problems Brother    High blood pressure Maternal Grandmother    Liver disease Neg Hx    Colon cancer Neg Hx    Esophageal cancer Neg Hx      Prior to Admission medications   Medication Sig Start Date End Date Taking? Authorizing Provider  amLODipine (NORVASC) 5 MG tablet Take 1 tablet (5 mg total) by mouth 2 (two) times daily. 10/31/22   Revankar, Aundra Dubin, MD  aspirin EC (ASPIRIN LOW DOSE)  81 MG tablet Take 1 tablet (81 mg total) by mouth daily. 08/15/22   Baldo Daub, MD  blood glucose meter kit and supplies Dispense based on patient and insurance preference. Use up to four times daily as directed. (FOR ICD-10 E10.9, E11.9). 08/17/21   Saguier, Ramon Dredge, PA-C  Calcium Carbonate (CALCIUM 600 PO) Take 1 tablet by mouth daily.    [provider]  Cholecalciferol (VITAMIN D) 125 MCG (5000 UT) CAPS Take 5,000 Units by mouth daily.    [provider]  Cinnamon 500 MG capsule Take 1,000 mg by mouth daily.     [provider]  dorzolamide-timolol (COSOPT) 2-0.5 % ophthalmic solution SMARTSIG:In Eye(s) 10/30/22   [provider]  ezetimibe (ZETIA) 10 MG tablet Take 1 tablet (10 mg total) by mouth daily. 04/22/23   Baldo Daub, MD  hydrocortisone (ANUSOL-HC) 25 MG suppository Place 1 suppository (25 mg total) rectally 2 (two) times daily. Patient taking differently: Place 25 mg rectally as needed for hemorrhoids or anal itching. 12/25/21   Saguier, Ramon Dredge, PA-C  hydrocortisone (ANUSOL-HC) 25 MG suppository Place 1 suppository (25 mg total) rectally 2 (two) times daily. 11/11/22   Saguier, Ramon Dredge, PA-C  Lancets (ONETOUCH DELICA PLUS LANCET33G) MISC USE TO CHECK BLOOD SUGAR UP TO FOUR TIMES DAILY AS DIRECTED 09/20/21   Saguier, Ramon Dredge, PA-C  latanoprost (XALATAN) 0.005 % ophthalmic solution Place 1 drop into both eyes every evening. 09/12/21   [provider]  levothyroxine (SYNTHROID) 50 MCG tablet TAKE 1 TABLET BY MOUTH DAILY BEFORE BREAKFAST 05/30/23   Saguier, Ramon Dredge, PA-C  meloxicam (MOBIC) 7.5 MG tablet Take 1 tablet (7.5 mg total) by mouth daily. 04/17/23   Rice, Jamesetta Orleans, MD  Menaquinone-7 (VITAMIN K2 PO) Take 1 tablet by mouth at bedtime.    [provider]  metoprolol tartrate (LOPRESSOR) 50 MG tablet TAKE 1 TABLET BY MOUTH 2 TIMES A DAY 06/02/23   Saguier, Ramon Dredge, PA-C  Omega-3 Fatty Acids (OMEGA 3 PO) Take 2 tablets by mouth daily.    [provider]  Phytosterol Esters (CHOLEST CARE PO) Take 1,600 mg by mouth daily.    [provider]  Probiotic Product (PROBIOTIC DAILY PO) Take 1 tablet by mouth at bedtime.    [provider]  telmisartan (MICARDIS) 20 MG tablet Take 1 tablet (20 mg total) by mouth daily. 05/30/23   Revankar, Aundra Dubin, MD  vitamin C (ASCORBIC ACID) 500 MG tablet Take 500 mg by mouth daily.    [provider]    Physical Exam: Vitals:   06/11/23 2315 06/11/23 2330 06/12/23 0118 06/12/23 0120  BP: 116/87  (!) 130/45 (!) 142/53   Pulse: 76  65   Resp: 18 20 (!) 23   Temp:    97.6 F (36.4 C)  TempSrc:    Oral  SpO2: 97% 97% 94%   Height:         Constitutional: NAD, no pallor or diaphoresis   Eyes: PERTLA, lids and conjunctivae normal ENMT: Mucous membranes are moist. Posterior pharynx clear of any exudate or lesions.   Neck: supple, no masses  Respiratory: no wheezing, no crackles. No accessory muscle use.  Cardiovascular: S1 & S2 heard, regular rate and rhythm. No extremity edema.   Abdomen: No distension, no tenderness, soft. Bowel sounds active.  Musculoskeletal: no clubbing / cyanosis. No joint deformity upper and lower extremities.   Skin: no significant rashes, lesions, ulcers. Warm, dry, well-perfused. Neurologic: CN 2-12 grossly intact. Sensation to light touch  intact. Strength 3-4/5 in proximal RLE, otherwise 5/5. Alert and oriented to person and place. Resting tremor.  Psychiatric: Calm. Cooperative.    Labs and Imaging on Admission: I have personally reviewed following labs and imaging studies  CBC: Recent Labs  Lab 06/11/23 1942  WBC 10.5  HGB 13.2  HCT 40.7  MCV 88.5  PLT 313   Basic Metabolic Panel: Recent Labs  Lab 06/11/23 1942  NA 140  K 3.9  CL 99  CO2 27  GLUCOSE 119*  BUN 24*  CREATININE 0.80  CALCIUM 9.5   GFR: CrCl cannot be calculated (Unknown ideal weight.). Liver Function Tests: Recent Labs  Lab 06/11/23 1942  AST 15  ALT 11  ALKPHOS 68  BILITOT 1.0  PROT 7.6  ALBUMIN 3.6   No results for input(s): "LIPASE", "AMYLASE" in the last 168 hours. No results for input(s): "AMMONIA" in the last 168 hours. Coagulation Profile: No results for input(s): "INR", "PROTIME" in the last 168 hours. Cardiac Enzymes: No results for input(s): "CKTOTAL", "CKMB", "CKMBINDEX", "TROPONINI" in the last 168 hours. BNP (last 3 results) No results for input(s): "PROBNP" in the last 8760 hours. HbA1C: No results for input(s): "HGBA1C" in the last 72  hours. CBG: Recent Labs  Lab 06/11/23 1912  GLUCAP 141*   Lipid Profile: No results for input(s): "CHOL", "HDL", "LDLCALC", "TRIG", "CHOLHDL", "LDLDIRECT" in the last 72 hours. Thyroid Function Tests: No results for input(s): "TSH", "T4TOTAL", "FREET4", "T3FREE", "THYROIDAB" in the last 72 hours. Anemia Panel: No results for input(s): "VITAMINB12", "FOLATE", "FERRITIN", "TIBC", "IRON", "RETICCTPCT" in the last 72 hours. Urine analysis:    Component Value Date/Time   COLORURINE YELLOW 12/18/2022 1545   APPEARANCEUR CLEAR 12/18/2022 1545   LABSPEC 1.024 12/18/2022 1545   PHURINE 5.5 12/18/2022 1545   GLUCOSEU NEGATIVE 12/18/2022 1545   GLUCOSEU NEGATIVE 05/21/2021 1539   HGBUR NEGATIVE 12/18/2022 1545   BILIRUBINUR negative 04/10/2023 1047   BILIRUBINUR negative 03/01/2020 1646   KETONESUR negative 04/10/2023 1047   KETONESUR NEGATIVE 12/18/2022 1545   PROTEINUR negative 04/10/2023 1047   PROTEINUR NEGATIVE 12/18/2022 1545   UROBILINOGEN 0.2 04/10/2023 1047   UROBILINOGEN 0.2 05/21/2021 1539   NITRITE Negative 04/10/2023 1047   NITRITE NEGATIVE 12/18/2022 1545   LEUKOCYTESUR Small (1+) (A) 04/10/2023 1047   LEUKOCYTESUR 1+ (A) 12/18/2022 1545   Sepsis Labs: @LABRCNTIP (procalcitonin:4,lacticidven:4) ) Recent Results (from the past 240 hour(s))  Resp panel by RT-PCR (RSV, Flu A&B, Covid) Anterior Nasal Swab     Status: None   Collection Time: 06/11/23  8:17 PM   Specimen: Anterior Nasal Swab  Result Value Ref Range Status   SARS Coronavirus 2 by RT PCR NEGATIVE NEGATIVE Final    Comment: (NOTE) SARS-CoV-2 target nucleic acids are NOT DETECTED.  The SARS-CoV-2 RNA is generally detectable in upper respiratory specimens during the acute phase of infection. The lowest concentration of SARS-CoV-2 viral copies this assay can detect is 138 copies/mL. A negative result does not preclude SARS-Cov-2 infection and should not be used as the sole basis for treatment or other  patient management decisions. A negative result may occur with  improper specimen collection/handling, submission of specimen other than nasopharyngeal swab, presence of viral mutation(s) within the areas targeted by this assay, and inadequate number of viral copies(<138 copies/mL). A negative result must be combined with clinical observations, patient history, and epidemiological information. The expected result is Negative.  Fact Sheet for Patients:  BloggerCourse.com  Fact Sheet for Healthcare Providers:  SeriousBroker.it  This test  is no t yet approved or cleared by the [place] and  has been authorized for detection and/or diagnosis of SARS-CoV-2 by FDA under an Emergency Use Authorization (EUA). This EUA will remain  in effect (meaning this test can be used) for the duration of the COVID-19 declaration under Section 564(b)(1) of the Act, 21 U.S.C.section 360bbb-3(b)(1), unless the authorization is terminated  or revoked sooner.       Influenza A by PCR NEGATIVE NEGATIVE Final   Influenza B by PCR NEGATIVE NEGATIVE Final    Comment: (NOTE) The Xpert Xpress SARS-CoV-2/FLU/RSV plus assay is intended as an aid in the diagnosis of influenza from Nasopharyngeal swab specimens and should not be used as a sole basis for treatment. Nasal washings and aspirates are unacceptable for Xpert Xpress SARS-CoV-2/FLU/RSV testing.  Fact Sheet for Patients: BloggerCourse.com  Fact Sheet for Healthcare Providers: SeriousBroker.it  This test is not yet approved or cleared by the [place] FDA and has been authorized for detection and/or diagnosis of SARS-CoV-2 by FDA under an Emergency Use Authorization (EUA). This EUA will remain in effect (meaning this test can be used) for the duration of the COVID-19 declaration under Section 564(b)(1) of the Act, 21 U.S.C. section  360bbb-3(b)(1), unless the authorization is terminated or revoked.     Resp Syncytial Virus by PCR NEGATIVE NEGATIVE Final    Comment: (NOTE) Fact Sheet for Patients: BloggerCourse.com  Fact Sheet for Healthcare Providers: SeriousBroker.it  This test is not yet approved or cleared by the [place] FDA and has been authorized for detection and/or diagnosis of SARS-CoV-2 by FDA under an Emergency Use Authorization (EUA). This EUA will remain in effect (meaning this test can be used) for the duration of the COVID-19 declaration under Section 564(b)(1) of the Act, 21 U.S.C. section 360bbb-3(b)(1), unless the authorization is terminated or revoked.  Performed at [facility], [address] [place], [place] [address]      Radiological Exams on Admission: DG Chest Portable 1 View  Result Date: 06/11/2023 CLINICAL DATA:  Altered mental status EXAM: PORTABLE CHEST 1 VIEW COMPARISON:  10/09/2021 FINDINGS: Stable cardiomediastinal silhouette. Aortic atherosclerotic calcification. No focal consolidation, pleural effusion, or pneumothorax. No displaced rib fractures. IMPRESSION: No acute cardiopulmonary disease. Electronically Signed   By: Minerva Fester M.D.   On: 06/11/2023 22:09    EKG: Independently reviewed. Sinus rhythm.   Assessment/Plan   1. Aphasia    - Continue cardiac monitoring and frequent neuro checks, keep NPO pending swallow eval, check MRI brain, echocardiogram, A1c, and lipid panel, consult SLP/PT/OT, follow-up neurology recommendations    2. Type II DM  - A1c was 6.2% in August 2024  - Check CBGs and use low-intensity SSI for now    3. Hypothyroidism  - Continue Synthroid    4. Hx of CVA  - Continue daily ASA 81   5. Hypertension  - Permit HTN for now     DVT prophylaxis: Lovenox  Code Status: Full  Level of Care: Level of care: Telemetry Medical Family Communication: Son updated from ED;  family friend at bedside   Disposition Plan:  Patient is from: home  Anticipated d/c is to: TBD Anticipated d/c date is: 06/13/23  Patient currently: Pending neurology consultation, therapy assessments, further diagnostic testing  Consults called: Neurology  Admission status: Inpatient     Briscoe Deutscher, MD [facility]  06/12/2023, 2:24 AM

## 2023-06-12 NOTE — ED Notes (Signed)
In attempt to get urine the entire suction was not working   Had to change the bed and the Pt. And place a new canister in the room.

## 2023-06-12 NOTE — Consult Note (Signed)
NEURO HOSPITALIST CONSULT NOTE   Requesting physician: Dr. Antionette Char  Reason for Consult: Acute confusion and speech difficulty  History obtained from:  Friend and Chart     HPI:                                                                                                                                          Breck Chrisley is an 89 y.o. female with a PMHx of adhesive capsulitis of left shoulder, stroke in 2023, cervical dystonia, chronic neck tremor, glaucoma, HTN, HLD, hypothyroidism, arthritis, mixed incontinence, post-Covid fatigue/joint pain/muscle pain, syncope, thyroid disease, DM2 and vertigo who presented to the [facility] ED yesterday evening for evaluation of new onset confusion and speech difficulty. Son had called her at 10 AM on Wednesday at which time she did not sound like herself. When he had spoken with her by telephone the night before at 6:47 PM, she had sounded normal. On the AM call, she seemed to be confused and had trouble communicating with him. He had a friend who was familiar with her go to check on her and after interacting with her the friend thought she seemed "strange." She was then brought to the ED. In the ED she was not able to reliably answer questions about her history and symptoms. She did answer yes and no, but stated "I don't know" to many questions. No other known illnesses. She speaks Spanish but is also normally fluent in [place], although with an accent.   Per Triage note at [facility]: "brought in for altered mental status, disoriented x4. Pt was brought in by a family friend who has her son on the phone to give more details. Last night she did not sleep well and this morning her son noticed she was confused. Son reports she is has no hx of dementia and is not normally confused. She spoke to her son on the phone yesterday evening at 1847 (he lives out of town) and she was not confused. He called her this morning at 10am and 'she was not normal, not  acting right.' "  Home medications include ASA.   Past Medical History:  Diagnosis Date   Adhesive capsulitis of left shoulder 12/22/2018   Bilateral shoulder pain 08/24/2021   Cerebrovascular accident (CVA) (HCC) 10/25/2021   Cervical dystonia 03/08/2019   Xeomin approved diagnosis.   COVID 05/29/2021   Dystonia    Edema 02/11/2019   Ganglion cyst 08/25/2020   Glaucoma    Hyperlipidemia 02/11/2019   Hypertension    Hypothyroidism (acquired) 01/19/2019   Inflammatory arthritis 08/24/2021   Mixed incontinence 03/15/2020   Formatting of this note might be different from the original.  Added automatically from request for surgery [id number]   Neck pain 01/19/2019   Post-COVID chronic fatigue 05/29/2021  Post-COVID chronic joint pain 05/29/2021   Post-COVID chronic muscle pain 05/29/2021   Primary open angle glaucoma (POAG) of both eyes, moderate stage 09/12/2021   Sedimentation rate elevation 08/24/2021   Stroke (HCC) 10/2021   Syncope 10/09/2021   Thyroid disease    Type 2 diabetes mellitus (HCC) 01/19/2019   Urgency incontinence 03/15/2020   Formatting of this note might be different from the original.  Added automatically from request for surgery [id number]   Urinary frequency 08/24/2021   Vertigo 12/01/2019    Past Surgical History:  Procedure Laterality Date   NO PAST SURGERIES      Family History  Problem Relation Age of Onset   Stroke Mother    Heart attack Father    Heart Problems Brother    High blood pressure Maternal Grandmother    Liver disease Neg Hx    Colon cancer Neg Hx    Esophageal cancer Neg Hx              Social History:  reports that she has never smoked. She has never used smokeless tobacco. She reports that she does not drink alcohol and does not use drugs.  Allergies  Allergen Reactions   Crestor [Rosuvastatin Calcium] Other (See Comments)    "caused me to go into kidney failure"    MEDICATIONS:                                                                                                                      Prior to Admission:  Facility-Administered Medications Prior to Admission  Medication Dose Route Frequency Provider Last Rate Last Admin   incobotulinumtoxinA (XEOMIN) 100 units injection 100 Units  100 Units Intramuscular Q90 days Levert Feinstein, MD       incobotulinumtoxinA [facility]) 100 units injection 100 Units  100 Units Intramuscular Q90 days Levert Feinstein, MD   100 Units at 05/03/22 2307   Medications Prior to Admission  Medication Sig Dispense Refill Last Dose   amLODipine (NORVASC) 5 MG tablet Take 1 tablet (5 mg total) by mouth 2 (two) times daily. 180 tablet 1    aspirin EC (ASPIRIN LOW DOSE) 81 MG tablet Take 1 tablet (81 mg total) by mouth daily. 90 tablet 2    blood glucose meter kit and supplies Dispense based on patient and insurance preference. Use up to four times daily as directed. (FOR ICD-10 E10.9, E11.9). 1 each 0    Calcium Carbonate (CALCIUM 600 PO) Take 1 tablet by mouth daily.      Cholecalciferol (VITAMIN D) 125 MCG (5000 UT) CAPS Take 5,000 Units by mouth daily.      Cinnamon 500 MG capsule Take 1,000 mg by mouth daily.      dorzolamide-timolol (COSOPT) 2-0.5 % ophthalmic solution SMARTSIG:In Eye(s)      ezetimibe (ZETIA) 10 MG tablet Take 1 tablet (10 mg total) by mouth daily. 30 tablet 1    hydrocortisone (ANUSOL-HC) 25 MG suppository Place 1 suppository (25 mg total) rectally 2 (two)  times daily. (Patient taking differently: Place 25 mg rectally as needed for hemorrhoids or anal itching.) 14 suppository 0    hydrocortisone (ANUSOL-HC) 25 MG suppository Place 1 suppository (25 mg total) rectally 2 (two) times daily. 12 suppository 0    Lancets (ONETOUCH DELICA PLUS LANCET33G) MISC USE TO CHECK BLOOD SUGAR UP TO FOUR TIMES DAILY AS DIRECTED 100 each 1    latanoprost (XALATAN) 0.005 % ophthalmic solution Place 1 drop into both eyes every evening.      levothyroxine (SYNTHROID) 50 MCG tablet TAKE 1 TABLET  BY MOUTH DAILY BEFORE BREAKFAST 30 tablet 0    meloxicam (MOBIC) 7.5 MG tablet Take 1 tablet (7.5 mg total) by mouth daily. 90 tablet 0    Menaquinone-7 (VITAMIN K2 PO) Take 1 tablet by mouth at bedtime.      metoprolol tartrate (LOPRESSOR) 50 MG tablet TAKE 1 TABLET BY MOUTH 2 TIMES A DAY 180 tablet 1    Omega-3 Fatty Acids (OMEGA 3 PO) Take 2 tablets by mouth daily.      Phytosterol Esters (CHOLEST CARE PO) Take 1,600 mg by mouth daily.      Probiotic Product (PROBIOTIC DAILY PO) Take 1 tablet by mouth at bedtime.      telmisartan (MICARDIS) 20 MG tablet Take 1 tablet (20 mg total) by mouth daily. 30 tablet 0    vitamin C (ASCORBIC ACID) 500 MG tablet Take 500 mg by mouth daily.      Scheduled:  [START ON 06/13/2023]  stroke: early stages of recovery book   Does not apply Once   aspirin EC  81 mg Oral Daily   enoxaparin (LOVENOX) injection  30 mg Subcutaneous Q24H   insulin aspart  0-5 Units Subcutaneous QHS   insulin aspart  0-6 Units Subcutaneous TID WC   levothyroxine  50 mcg Oral QAC breakfast     ROS:                                                                                                                                       Does not endorse any complaints or new symptoms. She does acknowledge that she is having difficulty understanding. Due to communication deficit, she is unable to answer detailed ROS questions. She does not appear to be in any pain.    Blood pressure (!) 144/56, pulse 67, temperature 98.3 F (36.8 C), temperature source Oral, resp. rate 18, height 4\' 9"  (1.448 m), SpO2 95%.   General Examination:  Physical Exam  HEENT-  [place]/AT    Lungs- Respirations unlabored Extremities- No edema  Neurological Examination Mental Status: Awake and alert. Oriented to self and location, but not able to give the day of the week, month, year, city or state, either  giving the wrong answer or stating "I don't know" in [place]. Speech is sparse and halting. She is able to demonstrate comprehension of some questions but not others. She is able to follow all simple verbal commands. Naming intact for nose, ear and telephone but not for "pen" to which she gives a circumlocutory answer. No dysarthria.   Cranial Nerves: II: Temporal visual fields intact bilaterally. PERRL  III,IV, VI: No ptosis. EOMI.  V: FT sensation equal bilaterally  VII: Smile symmetric VIII: Hearing intact to voice IX,X: No hoarseness XI: Symmetric shoulder shrug XII: Midline tongue extension Motor: BUE 5/5 proximally and distally BLE 5/5 proximally and distally  No pronator drift.  Sensory: Light touch intact throughout, bilaterally. No extinction to DSS.  Deep Tendon Reflexes: 2+ and symmetric throughout Cerebellar: No ataxia with FNF bilaterally  Gait: Deferred    Lab Results: Basic Metabolic Panel: Recent Labs  Lab 06/11/23 1942  NA 140  K 3.9  CL 99  CO2 27  GLUCOSE 119*  BUN 24*  CREATININE 0.80  CALCIUM 9.5    CBC: Recent Labs  Lab 06/11/23 1942  WBC 10.5  HGB 13.2  HCT 40.7  MCV 88.5  PLT 313    Cardiac Enzymes: No results for input(s): "CKTOTAL", "CKMB", "CKMBINDEX", "TROPONINI" in the last 168 hours.  Lipid Panel: No results for input(s): "CHOL", "TRIG", "HDL", "CHOLHDL", "VLDL", "LDLCALC" in the last 168 hours.  Imaging: CT ANGIO HEAD NECK W WO CM  Result Date: 06/11/2023 CLINICAL DATA:  Initial evaluation for neuro deficit, stroke suspected. Altered mental status. EXAM: CT ANGIOGRAPHY HEAD AND NECK WITH AND WITHOUT CONTRAST TECHNIQUE: Multidetector CT imaging of the head and neck was performed using the standard protocol during bolus administration of intravenous contrast. Multiplanar CT image reconstructions and MIPs were obtained to evaluate the vascular anatomy. Carotid stenosis measurements (when applicable) are obtained utilizing NASCET  criteria, using the distal internal carotid diameter as the denominator. RADIATION DOSE REDUCTION: This exam was performed according to the departmental dose-optimization program which includes automated exposure control, adjustment of the mA and/or kV according to patient size and/or use of iterative reconstruction technique. CONTRAST:  75mL OMNIPAQUE IOHEXOL 350 MG/ML SOLN COMPARISON:  Prior study from 10/10/2021 and 10/09/2021. FINDINGS: CT HEAD FINDINGS Brain: Generalized age-related cerebral atrophy. Patchy and confluent hypodensity involving the supratentorial cerebral white matter, most likely reflecting advanced chronic microvascular ischemic disease. Few remote lacunar infarcts present about the right basal ganglia and right thalamus. Small remote right cerebellar infarcts. Chronic left occipital infarct. No acute intracranial hemorrhage. No acute large vessel territory infarct. No mass lesion or midline shift. No hydrocephalus or extra-axial fluid collection. Vascular: No abnormal hyperdense vessel. Scattered vascular calcifications noted within the carotid siphons. Skull: Scalp soft tissues and calvarium within normal limits. Sinuses/Orbits: Globes and orbital soft tissues within normal limits. Paranasal sinuses are largely clear. No significant mastoid effusion. Other: None. Review of the MIP images confirms the above findings CTA NECK FINDINGS Aortic arch: Visualized aortic arch within normal limits for caliber. Aberrant right subclavian artery noted. Atheromatous change about the arch itself. No high-grade stenosis about the origin the great vessels. Right carotid system: Right common and internal carotid arteries are tortuous but patent without significant stenosis or dissection.  Left carotid system: Left common and internal carotid arteries are tortuous but patent without significant stenosis or dissection. Vertebral arteries: Both vertebral arteries arise from the subclavian arteries. Left V1 and V2  segments are diminutive and irregular but remain patent, progressed as compared to prior study from 10/09/2021. No visible raised dissection flap or other acute finding. Left V3 and V4 segments are supplied via muscular collaterals and are more normal in caliber. Right vertebral artery mildly irregular and hypoplastic but remains patent within the neck without dissection or hemodynamically significant stenosis. Skeleton: No discrete or worrisome osseous lesions. Other neck: No other acute finding. Upper chest: No other acute finding. Review of the MIP images confirms the above findings CTA HEAD FINDINGS Anterior circulation: Atheromatous change about the carotid siphons with associated mild multifocal narrowing on the right. A1 segments patent bilaterally. Normal anterior communicating artery complex. Anterior cerebral arteries patent without significant stenosis. No M1 stenosis or occlusion. Right MCA branches patent and well perfused. On the left, there is occlusion of a distal left M3 branch, inferior division, likely acute (series 605, image 84). Remainder of the left MCA branches remain patent perfused. Posterior circulation: Dominant left V4 segment widely patent. Moderate to severe atheromatous stenosis about the right vertebral artery as it crosses into the cranial vault. Right V4 segment otherwise patent. Both PICA grossly patent at their origins. Basilar patent without significant stenosis. Superior cerebellar and posterior cerebral arteries patent bilaterally. Venous sinuses: Grossly patent allowing for timing the contrast bolus. Anatomic variants: As above.  No aneurysm. Review of the MIP images confirms the above findings IMPRESSION: CT HEAD: 1. No acute intracranial abnormality. 2. Age-related cerebral atrophy with advanced chronic microvascular ischemic disease, with multiple remote infarcts as above. CTA HEAD AND NECK: 1. Occlusion of a distal left M3 branch, inferior division, likely acute. No  visible evolving ischemic changes by CT. 2. Diminutive and irregular left V1 and V2 segments, progressed as compared to prior study from 10/09/2021. No visible raised dissection flap or other acute finding. 3. Moderate to severe atheromatous stenosis about the right vertebral artery as it crosses into the cranial vault. 4. Diffuse tortuosity about the major arterial vasculature of the head and neck, suggesting chronic underlying hypertension. 5. Aberrant right subclavian artery. 6.  Aortic Atherosclerosis (ICD10-I70.0). Results were called by telephone at the time of interpretation on 06/11/2023 at 11:35 pm to provider [facility] , who verbally acknowledged these results. Electronically Signed   By: Rise Mu M.D.   On: 06/11/2023 23:39   DG Chest Portable 1 View  Result Date: 06/11/2023 CLINICAL DATA:  Altered mental status EXAM: PORTABLE CHEST 1 VIEW COMPARISON:  10/09/2021 FINDINGS: Stable cardiomediastinal silhouette. Aortic atherosclerotic calcification. No focal consolidation, pleural effusion, or pneumothorax. No displaced rib fractures. IMPRESSION: No acute cardiopulmonary disease. Electronically Signed   By: Minerva Fester M.D.   On: 06/11/2023 22:09     Assessment: 89 year old female presenting with acute onset of confusion and difficulty speaking.  - Exam reveals moderate mixed expressive and receptive aphasia.  - CT head: No acute intracranial abnormality. Age-related cerebral atrophy with advanced chronic microvascular ischemic disease, with multiple remote infarcts. - CTA of head and neck: Occlusion of a distal left M3 branch, inferior division, likely acute. No visible evolving ischemic changes by CT. Diminutive and irregular left V1 and V2 segments, progressed as compared to prior study from 10/09/2021. No visible raised dissection flap or other acute finding. Moderate to severe atheromatous stenosis about the right vertebral  artery as it crosses into the cranial vault.  Diffuse tortuosity about the major arterial vasculature of the head and neck, suggesting chronic underlying hypertension. Aberrant right subclavian artery. Aortic Atherosclerosis. - EKG: Sinus rhythm; Low voltage, precordial leads; Minimal ST depression, lateral leads; Artifact, no significant change compared to prior.   - CXR: No acute cardiopulmonary disease.  - Labs: - Mild hypokalemia. Electrolytes otherwise normal.  - Cholesterol and LDL are elevated.  - CBC normal - Glycated hemoglobin is elevated at 6.1 - The most likely component of the DDx for her presentation is a small acute ischemic infarction affecting the perisylvian cortices on the left. Acute encephalopathy due to toxic/metabolic or infectious causes is felt to be unlikely.    Recommendations: - MRI brain - TTE - PT consult, OT consult, Speech consult - Given her advanced age, risks of statin initiation may outweigh the potential benefits.  - Continue ASA - If an acute stroke is seen on MRI, then add Plavix to ASA.   - Risk factor modification - Telemetry monitoring - Frequent neuro checks - NPO until passes stroke swallow screen - BP management per standard protocol. Out of the permissive HTN time window.     Electronically signed: Dr. Caryl Pina 06/12/2023, 3:29 AM

## 2023-06-12 NOTE — ED Notes (Signed)
[facility] called for transport  No Current ETA... ED Nurse will call floor for report Called @ 00:48

## 2023-06-12 NOTE — Plan of Care (Signed)
  Problem: Education: Goal: Knowledge of disease or condition will improve Outcome: Progressing   Problem: Ischemic Stroke/TIA Tissue Perfusion: Goal: Complications of ischemic stroke/TIA will be minimized Outcome: Progressing   Problem: Nutrition: Goal: Dietary intake will improve Outcome: Progressing

## 2023-06-12 NOTE — ED Notes (Signed)
Report given to [facility]

## 2023-06-13 ENCOUNTER — Other Ambulatory Visit (HOSPITAL_COMMUNITY): Payer: Self-pay

## 2023-06-13 ENCOUNTER — Inpatient Hospital Stay (HOSPITAL_COMMUNITY): Payer: Medicare HMO

## 2023-06-13 ENCOUNTER — Encounter (HOSPITAL_COMMUNITY)
Admission: EM | Disposition: A | Discharge status: Home Health | Payer: Self-pay | Source: Home / Self Care | Attending: Internal Medicine

## 2023-06-13 DIAGNOSIS — I10 Essential (primary) hypertension: Secondary | ICD-10-CM | POA: Diagnosis not present

## 2023-06-13 DIAGNOSIS — I639 Cerebral infarction, unspecified: Secondary | ICD-10-CM | POA: Diagnosis not present

## 2023-06-13 DIAGNOSIS — G9340 Encephalopathy, unspecified: Secondary | ICD-10-CM | POA: Diagnosis not present

## 2023-06-13 HISTORY — PX: LOOP RECORDER INSERTION: EP1214

## 2023-06-13 LAB — BASIC METABOLIC PANEL
Anion gap: 9 (ref 5–15)
BUN: 17 mg/dL (ref 8–23)
CO2: 21 mmol/L — ABNORMAL LOW (ref 22–32)
Calcium: 8.8 mg/dL — ABNORMAL LOW (ref 8.9–10.3)
Chloride: 107 mmol/L (ref 98–111)
Creatinine, Ser: 0.84 mg/dL (ref 0.44–1.00)
GFR, Estimated: >60 mL/min (ref >60)
Glucose, Bld: 92 mg/dL (ref 70–99)
Potassium: 3.9 mmol/L (ref 3.5–5.1)
Sodium: 137 mmol/L (ref 135–145)

## 2023-06-13 LAB — CBC
HCT: 39.4 % (ref 36.0–46.0)
Hemoglobin: 13 g/dL (ref 12.0–15.0)
MCH: 28.8 pg (ref 26.0–34.0)
MCHC: 33 g/dL (ref 30.0–36.0)
MCV: 87.2 fL (ref 80.0–100.0)
Platelets: 290 10*3/uL (ref 150–400)
RBC: 4.52 MIL/uL (ref 3.87–5.11)
RDW: 14.7 % (ref 11.5–15.5)
WBC: 8.7 10*3/uL (ref 4.0–10.5)
nRBC: 0 % (ref 0.0–0.2)

## 2023-06-13 LAB — GLUCOSE, CAPILLARY
Glucose-Capillary: 74 mg/dL (ref 70–99)
Glucose-Capillary: 216 mg/dL — ABNORMAL HIGH (ref 70–99)

## 2023-06-13 SURGERY — LOOP RECORDER INSERTION
Anesthesia: LOCAL

## 2023-06-13 MED ORDER — LIDOCAINE-EPINEPHRINE 1 %-1:100000 IJ SOLN
INTRAMUSCULAR | Status: AC
  Filled 2023-06-13: qty 1

## 2023-06-13 MED ORDER — LIDOCAINE-EPINEPHRINE 1 %-1:100000 IJ SOLN
INTRAMUSCULAR | Status: DC | PRN
  Administered 2023-06-13: 10 mL

## 2023-06-13 MED ORDER — METOPROLOL TARTRATE 50 MG PO TABS: 50.0000 mg | ORAL_TABLET | Freq: Two times a day (BID) | ORAL | Status: DC

## 2023-06-13 MED ORDER — AMLODIPINE BESYLATE 5 MG PO TABS: 5.0000 mg | ORAL_TABLET | Freq: Two times a day (BID) | ORAL | Status: DC

## 2023-06-13 MED ORDER — CLOPIDOGREL BISULFATE 75 MG PO TABS
75.0000 mg | ORAL_TABLET | Freq: Every day | ORAL | 1 refills | Status: DC
  Filled 2023-06-13: qty 30, 30d supply, fill #0
  Filled 2023-07-01 – 2023-07-04 (×2): qty 30, 30d supply, fill #1

## 2023-06-13 MED ORDER — ATORVASTATIN CALCIUM 20 MG PO TABS
20.0000 mg | ORAL_TABLET | Freq: Every day | ORAL | 0 refills | Status: DC
  Filled 2023-06-13: qty 30, 30d supply, fill #0

## 2023-06-13 MED ORDER — ASPIRIN 81 MG PO TBEC: 81.0000 mg | DELAYED_RELEASE_TABLET | Freq: Every day | ORAL | Status: DC

## 2023-06-13 SURGICAL SUPPLY — 2 items
PACK LOOP INSERTION (CUSTOM PROCEDURE TRAY) ×1 IMPLANT
SYSTEM MONITOR REVEAL LINQ II (Prosthesis & Implant Heart) IMPLANT

## 2023-06-13 NOTE — Evaluation (Signed)
Speech Language Pathology Evaluation Patient Details Name: Leslie Ward MRN: [id number] DOB: 23-Jan-1931 Today's Date: 06/13/2023 Time: 4098-1191 SLP Time Calculation (min) (ACUTE ONLY): 30 min  Problem List:  Patient Active Problem List   Diagnosis Date Noted   Acute encephalopathy 06/11/2023   Osteoarthritis of right knee 12/18/2022   Glaucoma 11/26/2021   Cerebrovascular accident (CVA) (HCC) 10/25/2021   History of stroke 10/2021   Syncope 10/09/2021   Primary open angle glaucoma (POAG) of both eyes, moderate stage 09/12/2021   Sedimentation rate elevation 08/24/2021   Bilateral shoulder pain 08/24/2021   Inflammatory arthritis 08/24/2021   Urinary frequency 08/24/2021   COVID 05/29/2021   Post-COVID chronic fatigue 05/29/2021   Post-COVID chronic joint pain 05/29/2021   Post-COVID chronic muscle pain 05/29/2021   BPPV (benign paroxysmal positional vertigo), right 01/03/2021   Thyroid disease    Ganglion cyst 08/25/2020   Mixed incontinence 03/15/2020   Urgency incontinence 03/15/2020   Blepharitis of upper and lower eyelids of both eyes 01/07/2020   Dermatochalasis of both upper eyelids 01/07/2020   Hypertensive retinopathy of both eyes 01/07/2020   Macular RPE mottling 01/07/2020   Meibomian gland dysfunction (MGD) of both eyes 01/07/2020   Vitreous syneresis of both eyes 01/07/2020   Vertigo 12/01/2019   Cervical dystonia 03/08/2019   Hyperlipidemia 02/11/2019   Edema 02/11/2019   Hypertension 01/19/2019   Type 2 diabetes mellitus (HCC) 01/19/2019   Hypothyroidism (acquired) 01/19/2019   Neck pain 01/19/2019   Dystonia 01/19/2019   Adhesive capsulitis of left shoulder 12/22/2018   Past Medical History:  Past Medical History:  Diagnosis Date   Adhesive capsulitis of left shoulder 12/22/2018   Bilateral shoulder pain 08/24/2021   Cerebrovascular accident (CVA) (HCC) 10/25/2021   Cervical dystonia 03/08/2019   Xeomin approved diagnosis.   COVID 05/29/2021    Dystonia    Edema 02/11/2019   Ganglion cyst 08/25/2020   Glaucoma    Hyperlipidemia 02/11/2019   Hypertension    Hypothyroidism (acquired) 01/19/2019   Inflammatory arthritis 08/24/2021   Mixed incontinence 03/15/2020   Formatting of this note might be different from the original.  Added automatically from request for surgery [id number]   Neck pain 01/19/2019   Post-COVID chronic fatigue 05/29/2021   Post-COVID chronic joint pain 05/29/2021   Post-COVID chronic muscle pain 05/29/2021   Primary open angle glaucoma (POAG) of both eyes, moderate stage 09/12/2021   Sedimentation rate elevation 08/24/2021   Stroke (HCC) 10/2021   Syncope 10/09/2021   Thyroid disease    Type 2 diabetes mellitus (HCC) 01/19/2019   Urgency incontinence 03/15/2020   Formatting of this note might be different from the original.  Added automatically from request for surgery [id number]   Urinary frequency 08/24/2021   Vertigo 12/01/2019   Past Surgical History:  Past Surgical History:  Procedure Laterality Date   NO PAST SURGERIES     HPI:  89 y.o. female who presents with confusion and speech difficulty. CT head remote lacunar infarcts R basal ganglia and thalamus; no acute changes; CTA head and neck-occlusion distal M3 branch, mod to severe stenosis Rt vertebral PMH: cervical dystonia with head tremor, glaucoma, HTN, thyroid disease,  history of ischemic CVA, arthritis, incontinence, DM, vertigo   Assessment / Plan / Recommendation Clinical Impression  Pt seen for speech/language evaluation. Pt alert, pleasant, and cooperative. Friend at bedside. Pt declined use of interpreter and expressed wish to use friend to translate as needed. Assessment completed via informal mean and portions of Western Aphasia Battery Revised -  Bedside Record Form. Pt presents with a non-fluent aphasia most c/w Broca's Aphasia with concomittant apraxia of speech. Pt with mildly impaired complex auditory comprehension (2-step commands,  complex yes/no questions). Pt with impaired confrontation naming and repetition. Pt stimulable to phonemic cueing. Pt speech c/b occasionally halting speech and anomia with inconsistent repair of communication breakdowns. Pt switching back and forth between [place] and [place]. Spanish-accented English appreciated. Pt appears to have good awareness of CLOF. Anticipate benefit of continued SLP services acute/post-acute for above mentioned deficits.    SLP Assessment  SLP Recommendation/Assessment: Patient needs continued Speech Lanaguage Pathology Services SLP Visit Diagnosis: Aphasia (R47.01);Apraxia (R48.2)    Recommendations for follow up therapy are one component of a multi-disciplinary discharge planning process, led by the attending physician.  Recommendations may be updated based on patient status, additional functional criteria and insurance authorization.    Follow Up Recommendations  Home health SLP    Assistance Recommended at Discharge  Frequent or constant Supervision/Assistance  Functional Status Assessment Patient has had a recent decline in their functional status and demonstrates the ability to make significant improvements in function in a reasonable and predictable amount of time.  Frequency and Duration min 2x/week  2 weeks      SLP Evaluation Cognition  Overall Cognitive Status: Difficult to assess (due to aphasia) Arousal/Alertness: Awake/alert Orientation Level: Oriented X4       Comprehension  Auditory Comprehension Overall Auditory Comprehension: Impaired Yes/No Questions: Impaired Complex Questions: 50-74% accurate Commands: Impaired Two Step Basic Commands: 50-74% accurate Complex Commands: 50-74% accurate Conversation: Simple EffectiveTechniques: Repetition;Visual/Gestural cues Visual Recognition/Discrimination Discrimination: Not tested Reading Comprehension Reading Status: Not tested    Expression Expression Primary Mode of Expression:  Verbal Verbal Expression Overall Verbal Expression: Impaired Initiation: No impairment Automatic Speech: Name;Social Response Level of Generative/Spontaneous Verbalization: Sentence;Conversation Repetition: Impaired Level of Impairment: Phrase level;Sentence level Naming: Impairment Confrontation: Impaired Pragmatics: Impairment Impairments: Topic maintenance Effective Techniques: Phonemic cues   Oral / Motor  Oral Motor/Sensory Function Overall Oral Motor/Sensory Function: Within functional limits Motor Speech Overall Motor Speech: Appears within functional limits for tasks assessed Respiration: Within functional limits Phonation: Normal Resonance: Within functional limits Articulation: Within functional limitis Intelligibility: Intelligible Motor Planning: Witnin functional limits           Clyde Canterbury, M.S., CCC-SLP Speech-Language Pathologist Secure Chat Preferred  O: [phone]  Woodroe Chen 06/13/2023, 11:04 AM

## 2023-06-13 NOTE — Progress Notes (Addendum)
STROKE TEAM PROGRESS NOTE   BRIEF HPI Ms. Anays Detore is a 89 y.o. female with history of hypertension, type 2 diabetes mellitus, hypothyroidism, and history of ischemic CVA, cervical dystonia, chronic neck tremor, glaucoma,   presenting with  new onset confusion and speech difficulty. [name]    SIGNIFICANT HOSPITAL EVENTS Mri with Small acute/subacute infarcts in the left sub insular region and posterior left temporal lobe.  INTERIM HISTORY/SUBJECTIVE Son and daughter at the bedside. Patient states she feels much better. Still with some aphasia  Son is interested in discussing loop recorder with cardiology today.  OBJECTIVE  CBC    Component Value Date/Time   WBC 8.7 06/13/2023 0301   RBC 4.52 06/13/2023 0301   HGB 13.0 06/13/2023 0301   HCT 39.4 06/13/2023 0301   PLT 290 06/13/2023 0301   MCV 87.2 06/13/2023 0301   MCH 28.8 06/13/2023 0301   MCHC 33.0 06/13/2023 0301   RDW 14.7 06/13/2023 0301   LYMPHSABS 3.3 04/10/2023 1113   MONOABS 0.4 04/10/2023 1113   EOSABS 0.1 04/10/2023 1113   BASOSABS 0.1 04/10/2023 1113    BMET    Component Value Date/Time   NA 137 06/13/2023 0301   NA 144 11/09/2020 1101   K 3.9 06/13/2023 0301   CL 107 06/13/2023 0301   CO2 21 (L) 06/13/2023 0301   GLUCOSE 92 06/13/2023 0301   BUN 17 06/13/2023 0301   BUN 25 11/09/2020 1101   CREATININE 0.84 06/13/2023 0301   CREATININE 0.93 12/18/2022 1545   CALCIUM 8.8 (L) 06/13/2023 0301   EGFR 58 (L) 12/18/2022 1545   EGFR 68 11/09/2020 1101   GFRNONAA >60 06/13/2023 0301    IMAGING past 24 hours VAS [place] LOWER EXTREMITY VENOUS (DVT)  Result Date: 06/13/2023  Lower Venous DVT Study Patient Name:  SAVERA DONSON  Date of Exam:   06/13/2023 Medical Rec #: [id number]         Accession #:    [id number] Date of Birth: 04-02-1931         Patient Gender: F Patient Age:   42 years Exam Location:  [facility] Procedure:      VAS [place] LOWER EXTREMITY VENOUS (DVT) Referring Phys: Scheryl Marten Destry Bezdek  --------------------------------------------------------------------------------  Indications: Stroke.  Risk Factors: None identified. Comparison Study: No prior studies. Performing Technologist: Chanda Busing RVT  Examination Guidelines: A complete evaluation includes B-mode imaging, spectral Doppler, color Doppler, and power Doppler as needed of all accessible portions of each vessel. Bilateral testing is considered an integral part of a complete examination. Limited examinations for reoccurring indications may be performed as noted. The reflux portion of the exam is performed with the patient in reverse Trendelenburg.  +---------+---------------+---------+-----------+----------+--------------+ RIGHT    CompressibilityPhasicitySpontaneityPropertiesThrombus Aging +---------+---------------+---------+-----------+----------+--------------+ CFV      Full           Yes      Yes                                 +---------+---------------+---------+-----------+----------+--------------+ SFJ      Full                                                        +---------+---------------+---------+-----------+----------+--------------+ FV Prox  Full                                                        +---------+---------------+---------+-----------+----------+--------------+  FV Mid   Full                                                        +---------+---------------+---------+-----------+----------+--------------+ FV DistalFull                                                        +---------+---------------+---------+-----------+----------+--------------+ PFV      Full                                                        +---------+---------------+---------+-----------+----------+--------------+ POP      Full           Yes      Yes                                 +---------+---------------+---------+-----------+----------+--------------+ PTV      Full                                                         +---------+---------------+---------+-----------+----------+--------------+ PERO     Full                                                        +---------+---------------+---------+-----------+----------+--------------+   +---------+---------------+---------+-----------+----------+--------------+ LEFT     CompressibilityPhasicitySpontaneityPropertiesThrombus Aging +---------+---------------+---------+-----------+----------+--------------+ CFV      Full           Yes      Yes                                 +---------+---------------+---------+-----------+----------+--------------+ SFJ      Full                                                        +---------+---------------+---------+-----------+----------+--------------+ FV Prox  Full                                                        +---------+---------------+---------+-----------+----------+--------------+ FV Mid   Full                                                        +---------+---------------+---------+-----------+----------+--------------+  FV DistalFull                                                        +---------+---------------+---------+-----------+----------+--------------+ PFV      Full                                                        +---------+---------------+---------+-----------+----------+--------------+ POP      Full           Yes      Yes                                 +---------+---------------+---------+-----------+----------+--------------+ PTV      Full                                                        +---------+---------------+---------+-----------+----------+--------------+ PERO     Full                                                        +---------+---------------+---------+-----------+----------+--------------+     Summary: RIGHT: - There is no evidence of deep vein thrombosis in the  lower extremity.  - No cystic structure found in the popliteal fossa.  LEFT: - There is no evidence of deep vein thrombosis in the lower extremity.  - No cystic structure found in the popliteal fossa.  *See table(s) above for measurements and observations.    Preliminary    ECHOCARDIOGRAM COMPLETE  Result Date: 06/12/2023    ECHOCARDIOGRAM REPORT   Patient Name:   Edlyn Rosenburg Date of Exam: 06/12/2023 Medical Rec #:  [id number]        Height:       57.0 in Accession #:    [id number]       Weight:       121.3 lb Date of Birth:  [birth year]        BSA:          1.454 m Patient Age:    89 years         BP:           87/66 mmHg Patient Gender: F                HR:           73 bpm. Exam Location:  Inpatient Procedure: 2D Echo, Cardiac Doppler and Color Doppler Indications:    Stroke  History:        Patient has prior history of Echocardiogram examinations, most                 recent 10/10/2021. CAD; Risk Factors:Hypertension.  Sonographer:    Karma Ganja Referring Phys: Lavone Neri OPYD IMPRESSIONS  1. Left ventricular ejection fraction, by estimation, is 60 to  65%. The left ventricle has normal function. The left ventricle has no regional wall motion abnormalities. Left ventricular diastolic parameters are consistent with Grade I diastolic dysfunction (impaired relaxation).  2. Right ventricular systolic function is normal. The right ventricular size is normal. Tricuspid regurgitation signal is inadequate for assessing PA pressure.  3. Left atrial size was moderately dilated.  4. The mitral valve was not well visualized. No evidence of mitral valve regurgitation.  5. The aortic valve was not well visualized. Aortic valve regurgitation is not visualized.  6. The inferior vena cava is normal in size with greater than 50% respiratory variability, suggesting right atrial pressure of 3 mmHg. Comparison(s): No significant change from prior study. FINDINGS  Left Ventricle: Left ventricular ejection fraction, by  estimation, is 60 to 65%. The left ventricle has normal function. The left ventricle has no regional wall motion abnormalities. The left ventricular internal cavity size was normal in size. There is  borderline left ventricular hypertrophy. Left ventricular diastolic parameters are consistent with Grade I diastolic dysfunction (impaired relaxation). Right Ventricle: The right ventricular size is normal. Right ventricular systolic function is normal. Tricuspid regurgitation signal is inadequate for assessing PA pressure. Left Atrium: Left atrial size was moderately dilated. Right Atrium: Right atrial size was normal in size. Pericardium: There is no evidence of pericardial effusion. Mitral Valve: The mitral valve was not well visualized. No evidence of mitral valve regurgitation. Tricuspid Valve: Tricuspid valve regurgitation is not demonstrated. Aortic Valve: The aortic valve was not well visualized. Aortic valve regurgitation is not visualized. Aortic valve mean gradient measures 3.0 mmHg. Aortic valve peak gradient measures 6.2 mmHg. Aortic valve area, by VTI measures 2.39 cm. Pulmonic Valve: Pulmonic valve regurgitation is not visualized. Aorta: The aortic root and ascending aorta are structurally normal, with no evidence of dilitation. Venous: The inferior vena cava is normal in size with greater than 50% respiratory variability, suggesting right atrial pressure of 3 mmHg. IAS/Shunts: No atrial level shunt detected by color flow Doppler.  LEFT VENTRICLE PLAX 2D LVIDd:         4.00 cm   Diastology LVIDs:         2.20 cm   LV e' medial:    4.46 cm/s LV PW:         1.00 cm   LV E/e' medial:  12.0 LV IVS:        1.00 cm   LV e' lateral:   4.79 cm/s LVOT diam:     1.90 cm   LV E/e' lateral: 11.2 LV SV:         56 LV SV Index:   39 LVOT Area:     2.84 cm  RIGHT VENTRICLE             IVC RV Basal diam:  3.00 cm     IVC diam: 1.40 cm RV S prime:     12.00 cm/s TAPSE (M-mode): 2.3 cm LEFT ATRIUM             Index         RIGHT ATRIUM           Index LA diam:        3.60 cm 2.48 cm/m   RA Area:     14.50 cm LA Vol (A2C):   77.8 ml 53.51 ml/m  RA Volume:   34.00 ml  23.38 ml/m LA Vol (A4C):   48.4 ml 33.29 ml/m LA Biplane Vol: 62.7 ml 43.12 ml/m  AORTIC  VALVE AV Area (Vmax):    2.36 cm AV Area (Vmean):   2.34 cm AV Area (VTI):     2.39 cm AV Vmax:           124.00 cm/s AV Vmean:          80.100 cm/s AV VTI:            0.236 m AV Peak Grad:      6.2 mmHg AV Mean Grad:      3.0 mmHg LVOT Vmax:         103.00 cm/s LVOT Vmean:        66.000 cm/s LVOT VTI:          0.199 m LVOT/AV VTI ratio: 0.84  AORTA Ao Root diam: 2.60 cm Ao Asc diam:  2.80 cm MITRAL VALVE MV Area (PHT): 2.76 cm     SHUNTS MV Decel Time: 275 msec     Systemic VTI:  0.20 m MV E velocity: 53.60 cm/s   Systemic Diam: 1.90 cm MV A velocity: 114.00 cm/s MV E/A ratio:  0.47 Photographer signed by Carolan Clines Signature Date/Time: 06/12/2023/3:02:53 PM    Final     Vitals:   06/12/23 2000 06/12/23 2350 06/13/23 0400 06/13/23 0800  BP: (!) 141/53 (!) 130/52 (!) 145/51 (!) 137/57  Pulse: 73 77 75 78  Resp: 17 18  16   Temp: 98 F (36.7 C) 97.9 F (36.6 C) (!) 97.5 F (36.4 C) 97.8 F (36.6 C)  TempSrc: Oral Oral Oral Oral  SpO2: 96% 92% 93%   Weight:      Height:         PHYSICAL EXAM General:  Alert, well-nourished, well-developed patient in no acute distress Psych:  Mood and affect appropriate for situation CV: Regular rate and rhythm on monitor Respiratory:  Regular, unlabored respirations on room air GI: Abdomen soft and nontender   NEURO:  Mental Status: AA&Ox3, patient is able to give clear and coherent history Speech/Language: speech is with mild aphasia, no dysarthria.  Naming, repetition, fluency, and comprehension intact.  Cranial Nerves:  II: PERRL. Visual fields full.  III, IV, VI: EOMI. Eyelids elevate symmetrically.  V: Sensation is intact to light touch and symmetrical to face.  VII: Face is symmetrical  resting and smiling VIII: hearing intact to voice. IX, X: Palate elevates symmetrically. Phonation is normal.  WU:[id number] shrug 5/5. XII: tongue is midline without fasciculations. Motor: 5/5 strength to all muscle groups tested.  Tone: is normal and bulk is normal Sensation- Intact to light touch bilaterally. Extinction absent to light touch to DSS.   Coordination: FTN intact bilaterally, HKS: no ataxia in BLE.No drift.  Gait- deferred  ASSESSMENT/PLAN  Acute Ischemic Infarct: left MCA territory 2 small infarcts, etiology: Concerning for cardio embolic source CT head No acute abnormality.   CTA head & neck Occlusion of a distal left M3 branch, inferior division, Diminutive and irregular left V1 and V2 segments, Moderate to severe atheromatous stenosis about the right vertebral artery MRI  Small acute/subacute infarcts in the left sub insular region and posterior left temporal lobe. Remote infarcts in the left occipital lobe, thalami and bilateral cerebellar hemispheres. 2D Echo EF 60 to 65% LE venous Doppler no DVT Will get a loop recorder at discharge to rule out A-fib LDL 150 HgbA1c 6.1 VTE prophylaxis - lovenox aspirin 81 mg daily prior to admission, now on aspirin 81 mg daily and clopidogrel 75 mg daily for 3 weeks and then plavix  alone. Therapy recommendations: Home health PT Disposition:  pending  Hx of Stroke/TIA March 2023 admitted for right cerebellum and left thalamic infarcts.  CTA head and neck showed moderate bilateral VA stenosis.  EF 70 to 75%.  LDL 127, A1c 6.0.  Discharged on DAPT and Zetia.  Hypertension Home meds: Amlodipine 5 mg, metoprolol 50 mg, Micardis 20 mg Stable Avoid low BP Long-term BP goal normotensive  Hyperlipidemia Home meds: Zetia 10 mg, resumed in hospital LDL 150, goal < 70 Add lipitor 20 No high intensity of statin due to hx of statin intolerance Continue statin and Zetia at discharge  Other Stroke Risk Factors Advanced age  Other  Active Problems Glaucoma History of BPPV Hypothyroidism-levothyroxine 50 mcg Cervical dystonia follow-up with Dr. Terrace Arabia at [facility] RLS Status post bladder stimulator  Hospital day # 1  Gevena Mart DNP, ACNPC-AG  [facility]  ATTENDING NOTE: I reviewed above note and agree with the assessment and plan. Pt was seen and examined.   Son at bedside.  Patient reclining in bed, neuro stable, unchanged, still has chronic neck tremor and cervical dystonia.  Discussed with son and patient about loop recorder, they are interested.  They further discussed with cardiology EP and would like to have loop recorder done before discharge.  Continue DAPT and statin and Zetia.  She will follow-up with Dr. Terrace Arabia at [facility] in 4 to 6 weeks.  For detailed assessment and plan, please refer to above/below as I have made changes wherever appropriate.   Neurology will sign off. Please call with questions. Pt will follow up with Dr. Terrace Arabia at [facility] in about 4-6 weeks. Thanks for the consult.   Marvel Plan, MD PhD Stroke Neurology 06/13/2023 3:59 PM    To contact Stroke Continuity provider, please refer to [email or web address]. After hours, contact General Neurology

## 2023-06-13 NOTE — Discharge Instructions (Signed)
Care After Your Loop Recorder  You have a [employer] Loop Recorder   Monitor your cardiac device site for redness, swelling, and drainage. Call the device clinic at [phone] if you experience these symptoms or fever/chills.  If you notice bleeding from your site, hold firm, but gently pressure with two fingers for 5 minutes. Dried blood on the steri-strips when removing the outer bandage is normal.   Keep the large square bandage on your site for 24 hours and then you may remove it yourself. Keep the steri-strips underneath in place.   You may shower after 72 hours / 3 days from your procedure with the steri-strips in place. They will usually fall off on their own, or may be removed after 10 days. Pat dry.   Avoid lotions, ointments, or perfumes over your incision until it is well-healed.  Please do not submerge in water until your site is completely healed.   Your device is MRI compatible.   Remote monitoring is used to monitor your cardiac device from home. This monitoring is scheduled every month by our office. It allows us to keep an eye on the function of your device to ensure it is working properly.    

## 2023-06-13 NOTE — TOC Progression Note (Signed)
Transition of Care (TOC) - Progression Note   Secure chatted MD for orders and face to face for HHPT,OT,SP. Confirmed with Kandee Keen with [facility] he has accepted referral and anticipated discharge date is today  Patient Details  Name: Leslie Ward MRN: [id number] Date of Birth: 09/15/1930  Transition of Care [facility]) CM/SW Contact  Kynzlie Hilleary, Adria Devon, RN Phone Number: 06/13/2023, 11:42 AM  Clinical Narrative:            Expected Discharge Plan and Services                                               Social Determinants of Health (SDOH) Interventions SDOH Screenings   Food Insecurity: No Food Insecurity (06/12/2023)  Housing: Low Risk  (06/12/2023)  Transportation Needs: No Transportation Needs (06/12/2023)  Utilities: Not At Risk (06/12/2023)  Alcohol Screen: Low Risk  (12/18/2022)  Depression (PHQ2-9): Low Risk  (12/18/2022)  Financial Resource Strain: Low Risk  (10/01/2021)  Physical Activity: Inactive (10/01/2021)  Social Connections: Moderately Isolated (12/18/2022)  Stress: No Stress Concern Present (10/01/2021)  Tobacco Use: Low Risk  (06/12/2023)    Readmission Risk Interventions     No data to display

## 2023-06-13 NOTE — Progress Notes (Signed)
Physical Therapy Treatment Patient Details Name: Leslie Ward MRN: [id number] DOB: [birth year] Today's Date: 06/13/2023   History of Present Illness 89 y.o. female who presents with confusion and speech difficulty. CT head remote lacunar infarcts R basal ganglia and thalamus; no acute changes; CTA head and neck-occlusion distal M3 branch, mod to severe stenosis Rt vertebral PMH: cervical dystonia with head tremor, glaucoma, HTN, thyroid disease,  history of ischemic CVA, arthritis, incontinence, DM, vertigo    PT Comments  Pt seen for PT tx with pt agreeable, son & friend present in room electing to interpret as needed. Session focused on gait with pt initiating with QC & reaching for RUE support. PT provided pt with RW & pt able to ambulate with CGA fade to supervision with decreased gait speed.  Upon return to room pt notes fatigue & elects to return to bed. Will continue to follow pt acutely to address strengthening, balance, endurance training & gait with LRAD.   If plan is discharge home, recommend the following: Assistance with cooking/housework;Assist for transportation   Can travel by private vehicle        Equipment Recommendations  None recommended by PT (pt already has RW)    Recommendations for Other Services       Precautions / Restrictions Precautions Precautions: Fall Restrictions Weight Bearing Restrictions: No     Mobility  Bed Mobility Overal bed mobility: Needs Assistance Bed Mobility: Sit to Supine       Sit to supine: Modified independent (Device/Increase time), HOB elevated, Used rails        Transfers Overall transfer level: Needs assistance Equipment used: Quad cane Transfers: Sit to/from Stand Sit to Stand: Contact guard assist                Ambulation/Gait Ambulation/Gait assistance: Contact guard assist, Supervision Gait Distance (Feet): 150 Feet Assistive device: Rolling walker (2 wheels)   Gait velocity: decreased      General Gait Details: Pt initiates gait with QC (in LUE) immediately reaching for PT's hand for support (RUE). PT educated pt on recommendatio to use RW vs QC & holding to someone's hand & pt reports using RW feels good. Educated pt & son on recommendation of pt using RW vs QC at d/c. 1 standing rest break when ambulating in hallway.   Stairs             Wheelchair Mobility     Tilt Bed    Modified Rankin (Stroke Patients Only)       Balance Overall balance assessment: Needs assistance Sitting-balance support: No upper extremity supported, Feet supported Sitting balance-Leahy Scale: Good     Standing balance support: Bilateral upper extremity supported, Reliant on assistive device for balance, During functional activity Standing balance-Leahy Scale: Fair                              Cognition Arousal: Alert Behavior During Therapy: WFL for tasks assessed/performed Overall Cognitive Status: Difficult to assess                                 General Comments: Pt speaks a little English, family/friend present in room to interpret, decline use of interpreter.        Exercises      General Comments        Pertinent Vitals/Pain Pain Assessment Pain Assessment: No/denies pain  Home Living     Available Help at Discharge: Family;Available PRN/intermittently Type of Home: Apartment                  Prior Function            PT Goals (current goals can now be found in the care plan section) Acute Rehab PT Goals Patient Stated Goal: go home PT Goal Formulation: With family Time For Goal Achievement: 06/26/23 Potential to Achieve Goals: Good Progress towards PT goals: Progressing toward goals    Frequency    Min 1X/week      PT Plan      Co-evaluation              AM-PAC PT "6 Clicks" Mobility   Outcome Measure  Help needed turning from your back to your side while in a flat bed without using  bedrails?: None Help needed moving from lying on your back to sitting on the side of a flat bed without using bedrails?: A Little Help needed moving to and from a bed to a chair (including a wheelchair)?: A Little Help needed standing up from a chair using your arms (e.g., wheelchair or bedside chair)?: A Little Help needed to walk in hospital room?: A Little Help needed climbing 3-5 steps with a railing? : A Little 6 Click Score: 19    End of Session   Activity Tolerance: Patient tolerated treatment well;Patient limited by fatigue Patient left: in bed;with call bell/phone within reach;with bed alarm set;with family/visitor present   PT Visit Diagnosis: Other abnormalities of gait and mobility (R26.89)     Time: 1610-9604 PT Time Calculation (min) (ACUTE ONLY): 20 min  Charges:    $Therapeutic Activity: 8-22 mins PT General Charges $$ ACUTE PT VISIT: 1 Visit                     Aleda Grana, PT, DPT 06/13/23, 2:45 PM    Sandi Mariscal 06/13/2023, 2:44 PM

## 2023-06-13 NOTE — Plan of Care (Signed)
PT goals entered as late entry.  Leslie Ward, PT, DPT 06/13/23, 2:39 PM   Problem: Acute Rehab PT Goals(only PT should resolve) Goal: Pt Will Go Supine/Side To Sit Flowsheets (Taken 06/13/2023 1439) Pt will go Supine/Side to Sit: Independently Goal: Pt Will Go Sit To Supine/Side Flowsheets (Taken 06/13/2023 1439) Pt will go Sit to Supine/Side: Independently Goal: Pt Will Transfer Bed To Chair/Chair To Bed Flowsheets (Taken 06/13/2023 1439) Pt will Transfer Bed to Chair/Chair to Bed: with modified independence Note: With LRAD Goal: Pt Will Ambulate Flowsheets (Taken 06/13/2023 1439) Pt will Ambulate:  > 125 feet  with modified independence  with least restrictive assistive device Goal: Pt/caregiver will Perform Home Exercise Program Flowsheets (Taken 06/13/2023 1439) Pt/caregiver will Perform Home Exercise Program:  For increased strengthening  For improved balance  With Supervision, verbal cues required/provided

## 2023-06-13 NOTE — Progress Notes (Signed)
Bilateral lower extremity venous duplex has been completed. Preliminary results can be found in CV Proc through chart review.   06/13/23 8:36 AM Olen Cordial RVT

## 2023-06-13 NOTE — Discharge Summary (Signed)
Physician Discharge Summary  Leslie Ward WUJ:[id number] DOB: April 11, 1931 DOA: 06/11/2023  PCP: Leslie Richters, PA-C  Admit date: 06/11/2023 Discharge date: 06/13/2023  Admitted From: (Home, ALF, ILF, SNF) Disposition:  (Home /facility name / Residential Hospice)  Recommendations for Outpatient Follow-up:  Follow up with PCP in 1-2 weeks Please obtain BMP/CBC in one week Ambulatory referral has been made to neurology  Home Health: (YES)  Diet recommendation: Heart Healthy   Brief/Interim Summary:  Leslie Ward is a 89 y.o. female with medical history significant for hypertension, type 2 diabetes mellitus, hypothyroidism, and history of ischemic CVA who presents with confusion and speech difficulty.  Her MRI brain significant for acute CVA, she was admitted for further workup.      Patient seemed to be in her usual state when she was speaking on the phone with her son yesterday evening, but he noted her to be confused when he spoke with her by phone again at approximately 10 AM today.  A family friend went to check on her, noted that she is typically conversant and oriented, but was not in her usual state.    Acute Ischemic Infarct: left MCA territory 2 small infarcts, etiology: Concerning for cardio embolic source -CT head No acute abnormality.   -CTA head & neck Occlusion of a distal left M3 branch, inferior division, Diminutive and irregular left V1 and V2 segments, Moderate to severe atheromatous stenosis about the right vertebral artery -MRI  Small acute/subacute infarcts in the left sub insular region and posterior left temporal lobe. Remote infarcts in the left occipital lobe, thalami and bilateral cerebellar hemispheres. -2D Echo EF 60 to 65% - LE venous Doppler >> No DVT -Neurology input greatly appreciated, she will need loop recorder as concern for cardioembolic stroke causing her acute CVA, EP has been consulted and loop recorder inserted prior  discharge - HgbA1c  6.1 - aspirin 81 mg daily prior to admission, now on aspirin 81 mg daily and clopidogrel 75 mg daily for 3 weeks and then plavix  alone.  Hypothyroidism  - Continue Synthroid     Hypertension  -As of hypertension during hospital stay, allow for gradual blood pressure control, overall her blood pressure has been acceptable during hospital stay off meds, so she is instructed to resume her Coreg and metoprolol in a gradual fashion, over the next week, and to continue to hold Micardis to avoid low blood pressure   Hyperlipidemia -LDL is 150, started on Lipitor 20 mg oral daily, no high intensity statin due to history of statin intolerance, continue with home Zetia on discharge  Diabetes mellitus, type II -Controlled with A1c of 6.1, no meds needed  Discharge Diagnoses:  Principal Problem:   Acute encephalopathy Active Problems:   Hypertension   Type 2 diabetes mellitus (HCC)   Hypothyroidism (acquired)   Cerebrovascular accident (CVA) [facility])    Discharge Instructions  Discharge Instructions     Ambulatory referral to Neurology   Complete by: As directed    Follow-up with Dr Leslie Ward in 4 to 6 weeks.  Patient is Dr. Zannie Ward patient.  Thanks   Diet - low sodium heart healthy   Complete by: As directed    Discharge instructions   Complete by: As directed    Follow with Primary MD Saguier, Ramon Dredge, PA-C in 7 days   Get CBC, CMP,  checked  by Primary MD next visit.    Activity: As tolerated with Full fall precautions use walker/cane & assistance as needed   Disposition Home  Diet: Heart Healthy    On your next visit with your primary care physician please Get Medicines reviewed and adjusted.   Please request your Prim.MD to go over all Hospital Tests and Procedure/Radiological results at the follow up, please get all Hospital records sent to your Prim MD by signing hospital release before you go home.   If you experience worsening of your admission symptoms, develop shortness  of breath, life threatening emergency, suicidal or homicidal thoughts you must seek medical attention immediately by calling 911 or calling your MD immediately  if symptoms less severe.  You Must read complete instructions/literature along with all the possible adverse reactions/side effects for all the Medicines you take and that have been prescribed to you. Take any new Medicines after you have completely understood and accpet all the possible adverse reactions/side effects.   Do not drive, operating heavy machinery, perform activities at heights, swimming or participation in water activities or provide baby sitting services if your were admitted for syncope or siezures until you have seen by Primary MD or a Neurologist and advised to do so again.  Do not drive when taking Pain medications.    Do not take more than prescribed Pain, Sleep and Anxiety Medications  Special Instructions: If you have smoked or chewed Tobacco  in the last 2 yrs please stop smoking, stop any regular Alcohol  and or any Recreational drug use.  Wear Seat belts while driving.   Please note  You were cared for by a hospitalist during your hospital stay. If you have any questions about your discharge medications or the care you received while you were in the hospital after you are discharged, you can call the unit and asked to speak with the hospitalist on call if the hospitalist that took care of you is not available. Once you are discharged, your primary care physician will handle any further medical issues. Please note that NO REFILLS for any discharge medications will be authorized once you are discharged, as it is imperative that you return to your primary care physician (or establish a relationship with a primary care physician if you do not have one) for your aftercare needs so that they can reassess your need for medications and monitor your lab values.   Increase activity slowly   Complete by: As directed        Allergies as of 06/13/2023       Reactions   Crestor [rosuvastatin Calcium] Other (See Comments)   "caused me to go into kidney failure"        Medication List     STOP taking these medications    meloxicam 7.5 MG tablet Commonly known as: MOBIC   telmisartan 20 MG tablet Commonly known as: MICARDIS       TAKE these medications    amLODipine 5 MG tablet Commonly known as: NORVASC Take 1 tablet (5 mg total) by mouth 2 (two) times daily. Start taking on: June 15, 2023 What changed: These instructions start on June 15, 2023. If you are unsure what to do until then, ask your doctor or other care provider.   ascorbic acid 500 MG tablet Commonly known as: VITAMIN C Take 500 mg by mouth daily.   aspirin EC 81 MG tablet Commonly known as: Aspirin Low Dose Take 1 tablet (81 mg total) by mouth daily. Discontinue in 20 days. What changed: additional instructions   atorvastatin 20 MG tablet Commonly known as: LIPITOR Tome 1 tableta (20 mg en total)  por va oral diariamente. (Take 1 tablet (20 mg total) by mouth daily.) Start taking on: June 14, 2023   CALCIUM 600 PO Take 1 tablet by mouth daily.   CHOLEST CARE PO Take 1,600 mg by mouth daily.   Cinnamon 500 MG capsule Take 1,000 mg by mouth daily.   citalopram 10 MG tablet Commonly known as: CELEXA Take 10 mg by mouth daily.   clopidogrel 75 MG tablet Commonly known as: PLAVIX Tome 1 tableta (75 mg en total) por va oral diariamente. (Take 1 tablet (75 mg total) by mouth daily.) Start taking on: June 14, 2023   dorzolamide-timolol 2-0.5 % ophthalmic solution Commonly known as: COSOPT Place 1 drop into both eyes 2 (two) times daily.   ezetimibe 10 MG tablet Commonly known as: ZETIA Take 1 tablet (10 mg total) by mouth daily.   hydrocortisone 25 MG suppository Commonly known as: ANUSOL-HC Place 1 suppository (25 mg total) rectally 2 (two) times daily.   latanoprost 0.005 % ophthalmic  solution Commonly known as: XALATAN Place 1 drop into both eyes every evening.   levothyroxine 50 MCG tablet Commonly known as: SYNTHROID TAKE 1 TABLET BY MOUTH DAILY BEFORE BREAKFAST   metoprolol tartrate 50 MG tablet Commonly known as: LOPRESSOR Take 1 tablet (50 mg total) by mouth 2 (two) times daily. Start taking on: June 18, 2023 What changed: These instructions start on June 18, 2023. If you are unsure what to do until then, ask your doctor or other care provider.   OMEGA 3 PO Take 2 tablets by mouth daily.   PROBIOTIC DAILY PO Take 1 tablet by mouth at bedtime.   Vitamin D 125 MCG (5000 UT) Caps Take 5,000 Units by mouth daily.   VITAMIN K2 PO Take 1 tablet by mouth at bedtime.        Follow-up Information     Care, [facility] Follow up.   Specialty: Home Health Services Why: Ashley Jacobs will contact you within 48 hours of DC to arrange a home visit Contact information: [address] [place] [address] [phone]                Allergies  Allergen Reactions   Crestor [Rosuvastatin Calcium] Other (See Comments)    "caused me to go into kidney failure"    Consultations: Cardiology electrophysiology Neurology   Procedures/Studies: EP PPM/ICD IMPLANT  Result Date: 06/13/2023 SURGEON:  Will Jorja Loa, MD   PREPROCEDURE DIAGNOSIS:  Cryptogenic stroke   POSTPROCEDURE DIAGNOSIS: Cryptogenic stroke    PROCEDURES:  1. Implantable loop recorder implantation   INTRODUCTION:  Dixie Coppa presents with a history of cryptogenic stroke The costs of loop recorder monitoring have been discussed with the patient. Appropriate time out was performed prior to the procedure.   DESCRIPTION OF PROCEDURE:  Informed written consent was obtained.  The patient required no sedation for the procedure today.  Mapping over the patient's chest was performed to identify the area where electrograms were most prominent for ILR recording.  This area  was found to be the left parasternal region over the 4th intercostal space. The patients left chest was therefore prepped and draped in the usual sterile fashion. The skin overlying the left parasternal region was infiltrated with lidocaine for local analgesia.  A 0.5-cm incision was made over the left parasternal region over the 3rd intercostal space.  A subcutaneous ILR pocket was fashioned using a combination of sharp and blunt dissection.  A Med[employer]onic Reveal LINQ 2 (serial #  [id number]) implantable loop recorder was then placed into the pocket  R waves were very prominent and measured 0.21mV.  Steri- Strips and a sterile dressing were then applied.  There were no early apparent complications.   CONCLUSIONS:  1. Successful implantation of a implantable loop recorder for a history of cryptogenic stroke  2. No early apparent complications. Will Jorja Loa, MD 06/13/2023 3:18 PM   VAS [place] LOWER EXTREMITY VENOUS (DVT)  Result Date: 06/13/2023  Lower Venous DVT Study Patient Name:  KIMIYO CARMICHEAL  Date of Exam:   06/13/2023 Medical Rec #: [id number]         Accession #:    [id number] Date of Birth: 06/10/1931         Patient Gender: F Patient Age:   23 years Exam Location:  [facility] Procedure:      VAS [place] LOWER EXTREMITY VENOUS (DVT) Referring Phys: Scheryl Marten XU --------------------------------------------------------------------------------  Indications: Stroke.  Risk Factors: None identified. Comparison Study: No prior studies. Performing Technologist: Chanda Busing RVT  Examination Guidelines: A complete evaluation includes B-mode imaging, spectral Doppler, color Doppler, and power Doppler as needed of all accessible portions of each vessel. Bilateral testing is considered an integral part of a complete examination. Limited examinations for reoccurring indications may be performed as noted. The reflux portion of the exam is performed with the patient in reverse Trendelenburg.   +---------+---------------+---------+-----------+----------+--------------+ RIGHT    CompressibilityPhasicitySpontaneityPropertiesThrombus Aging +---------+---------------+---------+-----------+----------+--------------+ CFV      Full           Yes      Yes                                 +---------+---------------+---------+-----------+----------+--------------+ SFJ      Full                                                        +---------+---------------+---------+-----------+----------+--------------+ FV Prox  Full                                                        +---------+---------------+---------+-----------+----------+--------------+ FV Mid   Full                                                        +---------+---------------+---------+-----------+----------+--------------+ FV DistalFull                                                        +---------+---------------+---------+-----------+----------+--------------+ PFV      Full                                                        +---------+---------------+---------+-----------+----------+--------------+  POP      Full           Yes      Yes                                 +---------+---------------+---------+-----------+----------+--------------+ PTV      Full                                                        +---------+---------------+---------+-----------+----------+--------------+ PERO     Full                                                        +---------+---------------+---------+-----------+----------+--------------+   +---------+---------------+---------+-----------+----------+--------------+ LEFT     CompressibilityPhasicitySpontaneityPropertiesThrombus Aging +---------+---------------+---------+-----------+----------+--------------+ CFV      Full           Yes      Yes                                  +---------+---------------+---------+-----------+----------+--------------+ SFJ      Full                                                        +---------+---------------+---------+-----------+----------+--------------+ FV Prox  Full                                                        +---------+---------------+---------+-----------+----------+--------------+ FV Mid   Full                                                        +---------+---------------+---------+-----------+----------+--------------+ FV DistalFull                                                        +---------+---------------+---------+-----------+----------+--------------+ PFV      Full                                                        +---------+---------------+---------+-----------+----------+--------------+ POP      Full           Yes      Yes                                 +---------+---------------+---------+-----------+----------+--------------+  PTV      Full                                                        +---------+---------------+---------+-----------+----------+--------------+ PERO     Full                                                        +---------+---------------+---------+-----------+----------+--------------+     Summary: RIGHT: - There is no evidence of deep vein thrombosis in the lower extremity.  - No cystic structure found in the popliteal fossa.  LEFT: - There is no evidence of deep vein thrombosis in the lower extremity.  - No cystic structure found in the popliteal fossa.  *See table(s) above for measurements and observations.    Preliminary    ECHOCARDIOGRAM COMPLETE  Result Date: 06/12/2023    ECHOCARDIOGRAM REPORT   Patient Name:   Contina Strain Date of Exam: 06/12/2023 Medical Rec #:  [id number]        Height:       57.0 in Accession #:    [id number]       Weight:       121.3 lb Date of Birth:  Jul 28, 1931        BSA:          1.454 m  Patient Age:    89 years         BP:           87/66 mmHg Patient Gender: F                HR:           73 bpm. Exam Location:  Inpatient Procedure: 2D Echo, Cardiac Doppler and Color Doppler Indications:    Stroke  History:        Patient has prior history of Echocardiogram examinations, most                 recent 10/10/2021. CAD; Risk Factors:Hypertension.  Sonographer:    Karma Ganja Referring Phys: Lavone Neri OPYD IMPRESSIONS  1. Left ventricular ejection fraction, by estimation, is 60 to 65%. The left ventricle has normal function. The left ventricle has no regional wall motion abnormalities. Left ventricular diastolic parameters are consistent with Grade I diastolic dysfunction (impaired relaxation).  2. Right ventricular systolic function is normal. The right ventricular size is normal. Tricuspid regurgitation signal is inadequate for assessing PA pressure.  3. Left atrial size was moderately dilated.  4. The mitral valve was not well visualized. No evidence of mitral valve regurgitation.  5. The aortic valve was not well visualized. Aortic valve regurgitation is not visualized.  6. The inferior vena cava is normal in size with greater than 50% respiratory variability, suggesting right atrial pressure of 3 mmHg. Comparison(s): No significant change from prior study. FINDINGS  Left Ventricle: Left ventricular ejection fraction, by estimation, is 60 to 65%. The left ventricle has normal function. The left ventricle has no regional wall motion abnormalities. The left ventricular internal cavity size was normal in size. There is  borderline left ventricular hypertrophy. Left ventricular diastolic parameters are consistent with Grade I diastolic dysfunction (impaired  relaxation). Right Ventricle: The right ventricular size is normal. Right ventricular systolic function is normal. Tricuspid regurgitation signal is inadequate for assessing PA pressure. Left Atrium: Left atrial size was moderately dilated. Right  Atrium: Right atrial size was normal in size. Pericardium: There is no evidence of pericardial effusion. Mitral Valve: The mitral valve was not well visualized. No evidence of mitral valve regurgitation. Tricuspid Valve: Tricuspid valve regurgitation is not demonstrated. Aortic Valve: The aortic valve was not well visualized. Aortic valve regurgitation is not visualized. Aortic valve mean gradient measures 3.0 mmHg. Aortic valve peak gradient measures 6.2 mmHg. Aortic valve area, by VTI measures 2.39 cm. Pulmonic Valve: Pulmonic valve regurgitation is not visualized. Aorta: The aortic root and ascending aorta are structurally normal, with no evidence of dilitation. Venous: The inferior vena cava is normal in size with greater than 50% respiratory variability, suggesting right atrial pressure of 3 mmHg. IAS/Shunts: No atrial level shunt detected by color flow Doppler.  LEFT VENTRICLE PLAX 2D LVIDd:         4.00 cm   Diastology LVIDs:         2.20 cm   LV e' medial:    4.46 cm/s LV PW:         1.00 cm   LV E/e' medial:  12.0 LV IVS:        1.00 cm   LV e' lateral:   4.79 cm/s LVOT diam:     1.90 cm   LV E/e' lateral: 11.2 LV SV:         56 LV SV Index:   39 LVOT Area:     2.84 cm  RIGHT VENTRICLE             IVC RV Basal diam:  3.00 cm     IVC diam: 1.40 cm RV S prime:     12.00 cm/s TAPSE (M-mode): 2.3 cm LEFT ATRIUM             Index        RIGHT ATRIUM           Index LA diam:        3.60 cm 2.48 cm/m   RA Area:     14.50 cm LA Vol (A2C):   77.8 ml 53.51 ml/m  RA Volume:   34.00 ml  23.38 ml/m LA Vol (A4C):   48.4 ml 33.29 ml/m LA Biplane Vol: 62.7 ml 43.12 ml/m  AORTIC VALVE AV Area (Vmax):    2.36 cm AV Area (Vmean):   2.34 cm AV Area (VTI):     2.39 cm AV Vmax:           124.00 cm/s AV Vmean:          80.100 cm/s AV VTI:            0.236 m AV Peak Grad:      6.2 mmHg AV Mean Grad:      3.0 mmHg LVOT Vmax:         103.00 cm/s LVOT Vmean:        66.000 cm/s LVOT VTI:          0.199 m LVOT/AV VTI ratio:  0.84  AORTA Ao Root diam: 2.60 cm Ao Asc diam:  2.80 cm MITRAL VALVE MV Area (PHT): 2.76 cm     SHUNTS MV Decel Time: 275 msec     Systemic VTI:  0.20 m MV E velocity: 53.60 cm/s   Systemic Diam: 1.90 cm MV  A velocity: 114.00 cm/s MV E/A ratio:  0.47 Carolan Clines Electronically signed by Carolan Clines Signature Date/Time: 06/12/2023/3:02:53 PM    Final    MR BRAIN WO CONTRAST  Result Date: 06/12/2023 CLINICAL DATA:  Neuro deficit, acute, stroke suspected. EXAM: MRI HEAD WITHOUT CONTRAST TECHNIQUE: Multiplanar, multiecho pulse sequences of the brain and surrounding structures were obtained without intravenous contrast. COMPARISON:  Head CT June 11, 2019. FINDINGS: Brain: Small foci of restricted diffusion are seen in the left sub insular region and posterior left temporal lobe consistent with acute/subacute infarcts. No acute hemorrhage, hydrocephalus, extra-axial collection or mass lesion. Remote infarcts in the left occipital lobe, thalami and bilateral cerebellar hemispheres. Scattered and confluent foci of T2 hyperintensity are seen within the white matter of the cerebral hemispheres and within the pons, nonspecific, most likely related to chronic small vessel ischemia. Vascular: Major intracranial flow voids are patent at the skull base. Skull and upper cervical spine: Posterior disc protrusion at C3-4 causes at least moderate spinal canal stenosis. No focal marrow lesion identified. Sinuses/Orbits: Bilateral lens surgery. Mucosal thickening of the bilateral sphenoid sinuses with small fluid level on the right. Mild bilateral mastoid effusion. Other: None. IMPRESSION: 1. Small acute/subacute infarcts in the left sub insular region and posterior left temporal lobe. 2. Remote infarcts in the left occipital lobe, thalami and bilateral cerebellar hemispheres. 3. Advanced chronic microvascular ischemic changes of the white matter. 4. At least moderate spinal canal stenosis at C3-4. Electronically Signed   By:  Baldemar Lenis M.D.   On: 06/12/2023 12:26   CT ANGIO HEAD NECK W WO CM  Result Date: 06/11/2023 CLINICAL DATA:  Initial evaluation for neuro deficit, stroke suspected. Altered mental status. EXAM: CT ANGIOGRAPHY HEAD AND NECK WITH AND WITHOUT CONTRAST TECHNIQUE: Multidetector CT imaging of the head and neck was performed using the standard protocol during bolus administration of intravenous contrast. Multiplanar CT image reconstructions and MIPs were obtained to evaluate the vascular anatomy. Carotid stenosis measurements (when applicable) are obtained utilizing NASCET criteria, using the distal internal carotid diameter as the denominator. RADIATION DOSE REDUCTION: This exam was performed according to the departmental dose-optimization program which includes automated exposure control, adjustment of the mA and/or kV according to patient size and/or use of iterative reconstruction technique. CONTRAST:  75mL OMNIPAQUE IOHEXOL 350 MG/ML SOLN COMPARISON:  Prior study from 10/10/2021 and 10/09/2021. FINDINGS: CT HEAD FINDINGS Brain: Generalized age-related cerebral atrophy. Patchy and confluent hypodensity involving the supratentorial cerebral white matter, most likely reflecting advanced chronic microvascular ischemic disease. Few remote lacunar infarcts present about the right basal ganglia and right thalamus. Small remote right cerebellar infarcts. Chronic left occipital infarct. No acute intracranial hemorrhage. No acute large vessel territory infarct. No mass lesion or midline shift. No hydrocephalus or extra-axial fluid collection. Vascular: No abnormal hyperdense vessel. Scattered vascular calcifications noted within the carotid siphons. Skull: Scalp soft tissues and calvarium within normal limits. Sinuses/Orbits: Globes and orbital soft tissues within normal limits. Paranasal sinuses are largely clear. No significant mastoid effusion. Other: None. Review of the MIP images confirms the above  findings CTA NECK FINDINGS Aortic arch: Visualized aortic arch within normal limits for caliber. Aberrant right subclavian artery noted. Atheromatous change about the arch itself. No high-grade stenosis about the origin the great vessels. Right carotid system: Right common and internal carotid arteries are tortuous but patent without significant stenosis or dissection. Left carotid system: Left common and internal carotid arteries are tortuous but patent without significant stenosis or dissection. Vertebral  arteries: Both vertebral arteries arise from the subclavian arteries. Left V1 and V2 segments are diminutive and irregular but remain patent, progressed as compared to prior study from 10/09/2021. No visible raised dissection flap or other acute finding. Left V3 and V4 segments are supplied via muscular collaterals and are more normal in caliber. Right vertebral artery mildly irregular and hypoplastic but remains patent within the neck without dissection or hemodynamically significant stenosis. Skeleton: No discrete or worrisome osseous lesions. Other neck: No other acute finding. Upper chest: No other acute finding. Review of the MIP images confirms the above findings CTA HEAD FINDINGS Anterior circulation: Atheromatous change about the carotid siphons with associated mild multifocal narrowing on the right. A1 segments patent bilaterally. Normal anterior communicating artery complex. Anterior cerebral arteries patent without significant stenosis. No M1 stenosis or occlusion. Right MCA branches patent and well perfused. On the left, there is occlusion of a distal left M3 branch, inferior division, likely acute (series 605, image 84). Remainder of the left MCA branches remain patent perfused. Posterior circulation: Dominant left V4 segment widely patent. Moderate to severe atheromatous stenosis about the right vertebral artery as it crosses into the cranial vault. Right V4 segment otherwise patent. Both PICA  grossly patent at their origins. Basilar patent without significant stenosis. Superior cerebellar and posterior cerebral arteries patent bilaterally. Venous sinuses: Grossly patent allowing for timing the contrast bolus. Anatomic variants: As above.  No aneurysm. Review of the MIP images confirms the above findings IMPRESSION: CT HEAD: 1. No acute intracranial abnormality. 2. Age-related cerebral atrophy with advanced chronic microvascular ischemic disease, with multiple remote infarcts as above. CTA HEAD AND NECK: 1. Occlusion of a distal left M3 branch, inferior division, likely acute. No visible evolving ischemic changes by CT. 2. Diminutive and irregular left V1 and V2 segments, progressed as compared to prior study from 10/09/2021. No visible raised dissection flap or other acute finding. 3. Moderate to severe atheromatous stenosis about the right vertebral artery as it crosses into the cranial vault. 4. Diffuse tortuosity about the major arterial vasculature of the head and neck, suggesting chronic underlying hypertension. 5. Aberrant right subclavian artery. 6.  Aortic Atherosclerosis (ICD10-I70.0). Results were called by telephone at the time of interpretation on 06/11/2023 at 11:35 pm to provider [facility] , who verbally acknowledged these results. Electronically Signed   By: Rise Mu M.D.   On: 06/11/2023 23:39   DG Chest Portable 1 View  Result Date: 06/11/2023 CLINICAL DATA:  Altered mental status EXAM: PORTABLE CHEST 1 VIEW COMPARISON:  10/09/2021 FINDINGS: Stable cardiomediastinal silhouette. Aortic atherosclerotic calcification. No focal consolidation, pleural effusion, or pneumothorax. No displaced rib fractures. IMPRESSION: No acute cardiopulmonary disease. Electronically Signed   By: Minerva Fester M.D.   On: 06/11/2023 22:09      Subjective:  No significant events overnight, she denies any complaints, reports her speech much improved Discharge Exam: Vitals:    06/13/23 0400 06/13/23 0800  BP: (!) 145/51 (!) 137/57  Pulse: 75 78  Resp:  16  Temp: (!) 97.5 F (36.4 C) 97.8 F (36.6 C)  SpO2: 93%    Vitals:   06/12/23 2000 06/12/23 2350 06/13/23 0400 06/13/23 0800  BP: (!) 141/53 (!) 130/52 (!) 145/51 (!) 137/57  Pulse: 73 77 75 78  Resp: 17 18  16   Temp: 98 F (36.7 C) 97.9 F (36.6 C) (!) 97.5 F (36.4 C) 97.8 F (36.6 C)  TempSrc: Oral Oral Oral Oral  SpO2: 96% 92% 93%   Weight:  Height:        General: Pt is alert, awake, not in acute distress Cardiovascular: RRR, S1/S2 +, no rubs, no gallops Respiratory: CTA bilaterally, no wheezing, no rhonchi Abdominal: Soft, NT, ND, bowel sounds + Extremities: no edema, no cyanosis    The results of significant diagnostics from this hospitalization (including imaging, microbiology, ancillary and laboratory) are listed below for reference.     Microbiology: Recent Results (from the past 240 hour(s))  Resp panel by RT-PCR (RSV, Flu A&B, Covid) Anterior Nasal Swab     Status: None   Collection Time: 06/11/23  8:17 PM   Specimen: Anterior Nasal Swab  Result Value Ref Range Status   SARS Coronavirus 2 by RT PCR NEGATIVE NEGATIVE Final    Comment: (NOTE) SARS-CoV-2 target nucleic acids are NOT DETECTED.  The SARS-CoV-2 RNA is generally detectable in upper respiratory specimens during the acute phase of infection. The lowest concentration of SARS-CoV-2 viral copies this assay can detect is 138 copies/mL. A negative result does not preclude SARS-Cov-2 infection and should not be used as the sole basis for treatment or other patient management decisions. A negative result may occur with  improper specimen collection/handling, submission of specimen other than nasopharyngeal swab, presence of viral mutation(s) within the areas targeted by this assay, and inadequate number of viral copies(<138 copies/mL). A negative result must be combined with clinical observations, patient history,  and epidemiological information. The expected result is Negative.  Fact Sheet for Patients:  BloggerCourse.com  Fact Sheet for Healthcare Providers:  SeriousBroker.it  This test is no t yet approved or cleared by the [place] FDA and  has been authorized for detection and/or diagnosis of SARS-CoV-2 by FDA under an Emergency Use Authorization (EUA). This EUA will remain  in effect (meaning this test can be used) for the duration of the COVID-19 declaration under Section 564(b)(1) of the Act, 21 U.S.C.section 360bbb-3(b)(1), unless the authorization is terminated  or revoked sooner.       Influenza A by PCR NEGATIVE NEGATIVE Final   Influenza B by PCR NEGATIVE NEGATIVE Final    Comment: (NOTE) The Xpert Xpress SARS-CoV-2/FLU/RSV plus assay is intended as an aid in the diagnosis of influenza from Nasopharyngeal swab specimens and should not be used as a sole basis for treatment. Nasal washings and aspirates are unacceptable for Xpert Xpress SARS-CoV-2/FLU/RSV testing.  Fact Sheet for Patients: BloggerCourse.com  Fact Sheet for Healthcare Providers: SeriousBroker.it  This test is not yet approved or cleared by the [place] FDA and has been authorized for detection and/or diagnosis of SARS-CoV-2 by FDA under an Emergency Use Authorization (EUA). This EUA will remain in effect (meaning this test can be used) for the duration of the COVID-19 declaration under Section 564(b)(1) of the Act, 21 U.S.C. section 360bbb-3(b)(1), unless the authorization is terminated or revoked.     Resp Syncytial Virus by PCR NEGATIVE NEGATIVE Final    Comment: (NOTE) Fact Sheet for Patients: BloggerCourse.com  Fact Sheet for Healthcare Providers: SeriousBroker.it  This test is not yet approved or cleared by the [place] FDA and has  been authorized for detection and/or diagnosis of SARS-CoV-2 by FDA under an Emergency Use Authorization (EUA). This EUA will remain in effect (meaning this test can be used) for the duration of the COVID-19 declaration under Section 564(b)(1) of the Act, 21 U.S.C. section 360bbb-3(b)(1), unless the authorization is terminated or revoked.  Performed at [facility], [address] [place], [place] [address]  Labs: BNP (last 3 results) No results for input(s): "BNP" in the last 8760 hours. Basic Metabolic Panel: Recent Labs  Lab 06/11/23 1942 06/12/23 0424 06/13/23 0301  NA 140 138 137  K 3.9 3.3* 3.9  CL 99 105 107  CO2 27 24 21*  GLUCOSE 119* 94 92  BUN 24* 15 17  CREATININE 0.80 0.76 0.84  CALCIUM 9.5 9.1 8.8*   Liver Function Tests: Recent Labs  Lab 06/11/23 1942  AST 15  ALT 11  ALKPHOS 68  BILITOT 1.0  PROT 7.6  ALBUMIN 3.6   No results for input(s): "LIPASE", "AMYLASE" in the last 168 hours. No results for input(s): "AMMONIA" in the last 168 hours. CBC: Recent Labs  Lab 06/11/23 1942 06/12/23 0424 06/13/23 0301  WBC 10.5 8.7 8.7  HGB 13.2 13.1 13.0  HCT 40.7 40.2 39.4  MCV 88.5 88.4 87.2  PLT 313 309 290   Cardiac Enzymes: No results for input(s): "CKTOTAL", "CKMB", "CKMBINDEX", "TROPONINI" in the last 168 hours. BNP: Invalid input(s): "POCBNP" CBG: Recent Labs  Lab 06/11/23 1912 06/12/23 2110 06/13/23 0905  GLUCAP 141* 103* 74   D-Dimer No results for input(s): "DDIMER" in the last 72 hours. Hgb A1c Recent Labs    06/12/23 0424  HGBA1C 6.1*   Lipid Profile Recent Labs    06/12/23 0424  CHOL 233*  HDL 63  LDLCALC 150*  TRIG 102  CHOLHDL 3.7   Thyroid function studies No results for input(s): "TSH", "T4TOTAL", "T3FREE", "THYROIDAB" in the last 72 hours.  Invalid input(s): "FREET3" Anemia work up No results for input(s): "VITAMINB12", "FOLATE", "FERRITIN", "TIBC", "IRON", "RETICCTPCT" in the last 72  hours. Urinalysis    Component Value Date/Time   COLORURINE YELLOW 12/18/2022 1545   APPEARANCEUR CLEAR 12/18/2022 1545   LABSPEC 1.024 12/18/2022 1545   PHURINE 5.5 12/18/2022 1545   GLUCOSEU NEGATIVE 12/18/2022 1545   GLUCOSEU NEGATIVE 05/21/2021 1539   HGBUR NEGATIVE 12/18/2022 1545   BILIRUBINUR negative 04/10/2023 1047   BILIRUBINUR negative 03/01/2020 1646   KETONESUR negative 04/10/2023 1047   KETONESUR NEGATIVE 12/18/2022 1545   PROTEINUR negative 04/10/2023 1047   PROTEINUR NEGATIVE 12/18/2022 1545   UROBILINOGEN 0.2 04/10/2023 1047   UROBILINOGEN 0.2 05/21/2021 1539   NITRITE Negative 04/10/2023 1047   NITRITE NEGATIVE 12/18/2022 1545   LEUKOCYTESUR Small (1+) (A) 04/10/2023 1047   LEUKOCYTESUR 1+ (A) 12/18/2022 1545   Sepsis Labs Recent Labs  Lab 06/11/23 1942 06/12/23 0424 06/13/23 0301  WBC 10.5 8.7 8.7   Microbiology Recent Results (from the past 240 hour(s))  Resp panel by RT-PCR (RSV, Flu A&B, Covid) Anterior Nasal Swab     Status: None   Collection Time: 06/11/23  8:17 PM   Specimen: Anterior Nasal Swab  Result Value Ref Range Status   SARS Coronavirus 2 by RT PCR NEGATIVE NEGATIVE Final    Comment: (NOTE) SARS-CoV-2 target nucleic acids are NOT DETECTED.  The SARS-CoV-2 RNA is generally detectable in upper respiratory specimens during the acute phase of infection. The lowest concentration of SARS-CoV-2 viral copies this assay can detect is 138 copies/mL. A negative result does not preclude SARS-Cov-2 infection and should not be used as the sole basis for treatment or other patient management decisions. A negative result may occur with  improper specimen collection/handling, submission of specimen other than nasopharyngeal swab, presence of viral mutation(s) within the areas targeted by this assay, and inadequate number of viral copies(<138 copies/mL). A negative result must be combined with clinical observations, patient history,  and  epidemiological information. The expected result is Negative.  Fact Sheet for Patients:  BloggerCourse.com  Fact Sheet for Healthcare Providers:  SeriousBroker.it  This test is no t yet approved or cleared by the [place] FDA and  has been authorized for detection and/or diagnosis of SARS-CoV-2 by FDA under an Emergency Use Authorization (EUA). This EUA will remain  in effect (meaning this test can be used) for the duration of the COVID-19 declaration under Section 564(b)(1) of the Act, 21 U.S.C.section 360bbb-3(b)(1), unless the authorization is terminated  or revoked sooner.       Influenza A by PCR NEGATIVE NEGATIVE Final   Influenza B by PCR NEGATIVE NEGATIVE Final    Comment: (NOTE) The Xpert Xpress SARS-CoV-2/FLU/RSV plus assay is intended as an aid in the diagnosis of influenza from Nasopharyngeal swab specimens and should not be used as a sole basis for treatment. Nasal washings and aspirates are unacceptable for Xpert Xpress SARS-CoV-2/FLU/RSV testing.  Fact Sheet for Patients: BloggerCourse.com  Fact Sheet for Healthcare Providers: SeriousBroker.it  This test is not yet approved or cleared by the [place] FDA and has been authorized for detection and/or diagnosis of SARS-CoV-2 by FDA under an Emergency Use Authorization (EUA). This EUA will remain in effect (meaning this test can be used) for the duration of the COVID-19 declaration under Section 564(b)(1) of the Act, 21 U.S.C. section 360bbb-3(b)(1), unless the authorization is terminated or revoked.     Resp Syncytial Virus by PCR NEGATIVE NEGATIVE Final    Comment: (NOTE) Fact Sheet for Patients: BloggerCourse.com  Fact Sheet for Healthcare Providers: SeriousBroker.it  This test is not yet approved or cleared by the [place] FDA and has been  authorized for detection and/or diagnosis of SARS-CoV-2 by FDA under an Emergency Use Authorization (EUA). This EUA will remain in effect (meaning this test can be used) for the duration of the COVID-19 declaration under Section 564(b)(1) of the Act, 21 U.S.C. section 360bbb-3(b)(1), unless the authorization is terminated or revoked.  Performed at [facility] [place], [address] [place], [place] [address]      Time coordinating discharge: Over 30 minutes  SIGNED:   Huey Bienenstock, MD  [facility] 06/13/2023, 4:19 PM Pager   If 7PM-7AM, please contact night-coverage [email or web address] Password [email or web address]

## 2023-06-13 NOTE — Consult Note (Addendum)
ELECTROPHYSIOLOGY CONSULT NOTE  Patient ID: Leslie Ward MRN: [id number], DOB/AGE: June 05, 1931   Admit date: 06/11/2023 Date of Consult: 06/13/2023  Primary Physician: [name], PA-C Primary Cardiologist: Norman Herrlich, MD  Primary Electrophysiologist: New to None  Reason for Consultation: Cryptogenic stroke; recommendations regarding Implantable Loop Recorder Insurance: [facility] ACO  History of Present Illness: 89 y/o F with PMH of HTN, ischemic CVA, cervical dystonia, chronic tremor, glaucoma, hypothyroidism & DM II who presented to [facility]/30 with new onset speech difficulty. Work up consistent with L MCA infarct.   EP has been asked to evaluate [name] for placement of an implantable loop recorder to monitor for atrial fibrillation by Dr Roda Shutters. The patient was admitted on 06/11/2023 with L MCA territory 2 small infarcts.   Imaging demonstrated left MCA territory 2 small infarcts, etiology: Concerning for cardio embolic source .    She has undergone workup for stroke including:  CT head No acute abnormality.   CTA head & neck Occlusion of a distal left M3 branch, inferior division, Diminutive and irregular left V1 and V2 segments, Moderate to severe atheromatous stenosis about the right vertebral artery MRI  Small acute/subacute infarcts in the left sub insular region and posterior left temporal lobe. Remote infarcts in the left occipital lobe, thalami and bilateral cerebellar hemispheres. 2D Echo EF 60 to 65% LE venous Doppler pending Korianna Washer likely need a loop recorder at discharge, pt would like to discuss with her son and brother LDL 150 HgbA1c 6.1 VTE prophylaxis - lovenox aspirin 81 mg daily prior to admission, now on aspirin 81 mg daily and clopidogrel 75 mg daily for 3 weeks and then plavix alone.   The patient has been monitored on telemetry which has demonstrated sinus rhythm with no arrhythmias.  Inpatient stroke work-up Morgen Linebaugh not require a TEE per Neurology.    Echocardiogram as above. Lab work is reviewed.  Prior to admission, the patient denies chest pain, shortness of breath, dizziness, palpitations, or syncope.  She is recovering from her stroke with plans to return home  at discharge.  Allergies, Past Medical, Surgical, Social, and Family Histories have been reviewed and are referenced here-in when relevant for medical decision making.   Inpatient Medications:    stroke: early stages of recovery book   Does not apply Once   aspirin EC  81 mg Oral Daily   atorvastatin  20 mg Oral Daily   clopidogrel  75 mg Oral Daily   enoxaparin (LOVENOX) injection  30 mg Subcutaneous Q24H   ezetimibe  10 mg Oral Daily   insulin aspart  0-5 Units Subcutaneous QHS   insulin aspart  0-6 Units Subcutaneous TID WC   levothyroxine  50 mcg Oral QAC breakfast    Physical Exam: Vitals:   06/12/23 2000 06/12/23 2350 06/13/23 0400 06/13/23 0800  BP: (!) 141/53 (!) 130/52 (!) 145/51 (!) 137/57  Pulse: 73 77 75 78  Resp: 17 18  16   Temp: 98 F (36.7 C) 97.9 F (36.6 C) (!) 97.5 F (36.4 C) 97.8 F (36.6 C)  TempSrc: Oral Oral Oral Oral  SpO2: 96% 92% 93%   Weight:      Height:        GEN- NAD. A&O x 3. Normal affect. HEENT: Normocephalic, atraumatic Lungs- CTAB, Normal effort.  Heart- Regular rate and rhythm rate and rhythm. No M/G/R.  Extremities- No peripheral edema. no clubbing or cyanosis Skin- warm and dry, no rash or lesion. Neuro - awake, alert  12-lead ECG  06/13/23 SR 71 bpm (personally reviewed) All prior EKG's in EPIC reviewed with no documented atrial fibrillation  Telemetry SR 65-80's, no AF in ER or on unit  (personally reviewed)  Assessment and Plan:  Cryptogenic Stroke Hx Prior CVA/TIA - 10/3085 The patient presents with cryptogenic stroke.  The patient does not have a TEE planned for this AM.  I spoke at length with the patient about monitoring for afib with an implantable loop recorder.  Risks, benefits, and alteratives to  implantable loop recorder were discussed with the patient today.   At this time, the patient & son are clear in their decision to proceed with implantable loop recorder.    Wound care was reviewed with the patient (keep incision clean and dry for 3 days). Please call with questions.    Canary Brim, MSN, APRN, NP-C, AGACNP-BC [facility] - Electrophysiology  06/13/2023, 1:10 PM  I have seen and examined this patient with Canary Brim.  Agree with above, note added to reflect my findings.  On exam, RRR, no murmurs, lungs clear.  Patient presented to the hospital with cryptogenic stroke. To date, no cause has been found. [place] plan for LINQ monitor to look for atrial fibrillation. Risks and benefits discussed. Risks include but not limited to bleeding and infection. The patient understands the risks and has agreed to the procedure.  Saoirse Legere M. Shela Esses MD 06/13/2023 2:28 PM

## 2023-06-16 ENCOUNTER — Telehealth: Payer: Self-pay | Admitting: *Deleted

## 2023-06-16 ENCOUNTER — Encounter (HOSPITAL_COMMUNITY): Payer: Self-pay | Admitting: Cardiology

## 2023-06-16 NOTE — Transitions of Care (Post Inpatient/ED Visit) (Signed)
   06/16/2023  Name: Leslie Ward MRN: [id number] DOB: 07-Oct-1930  Today's TOC FU Call Status: Today's TOC FU Call Status:: Unsuccessful Call (1st Attempt) Unsuccessful Call (1st Attempt) Date: 06/16/23  Attempted to reach the patient regarding the most recent Inpatient visit; left HIPAA compliant voice message requesting call back  Call facilitated by [facility] [phone]-- interpreter [place] ID [phone]   Follow Up Plan: Additional outreach attempts will be made to reach the patient to complete the Transitions of Care (Post Inpatient visit) call.   Caryl Pina, RN, BSN, Media planner  Transitions of Care  [facility] - [facility] [phone]: direct office

## 2023-06-17 ENCOUNTER — Telehealth: Payer: Self-pay | Admitting: *Deleted

## 2023-06-17 NOTE — Transitions of Care (Post Inpatient/ED Visit) (Signed)
06/17/2023  Name: Leslie Ward MRN: [id number] DOB: 1931-03-28  Today's TOC FU Call Status: Today's TOC FU Call Status:: Successful TOC FU Call Completed TOC FU Call Complete Date: 06/17/23 Patient's Name and Date of Birth confirmed.  Transition Care Management Follow-up Telephone Call Date of Discharge: 06/13/23 Discharge Facility: [facility]) Type of Discharge: Inpatient Admission Primary Inpatient Discharge Diagnosis:: Acute metabolic encephalopathy; CVA; insertion of loop recorder How have you been since you were released from the hospital?: Better (per son/ caregiver: "I think she is better; she seems a little tired and weak after the hospital visit, but overall okay.  She lives with me, I handle all of her medications and health affairs; thanks for getting the appointment for [place] for tomorrow") Any questions or concerns?: No  Items Reviewed: Did you receive and understand the discharge instructions provided?: Yes Medications obtained,verified, and reconciled?: Partial Review Completed (Partial medication reconciliation/ review completed; confirmed patient obtained/ is taking all newly Rx'd medications as instructed; caregiver/ son-manages medications and denies questions/ concerns around medications today) Reason for Partial Mediation Review: Caregiver/ son reports he is currently working form home-- states time constraints Any new allergies since your discharge?: No Dietary orders reviewed?: Yes Type of Diet Ordered:: "Healthy as possible" Do you have support at home?: Yes People in Home: child(ren), adult Name of Support/Comfort Primary Source: Reports independent in some self-care activities; supoortive son/ caregiver assists as/ if needed/ indicated  Medications Reviewed Today: Medications Reviewed Today     Reviewed by Michaela Corner, RN (Registered Nurse) on 06/17/23 at 1012  Med List Status: <None>   Medication Order Taking? Sig Documenting Provider Last Dose  Status Informant  amLODipine (NORVASC) 5 MG tablet [id number]  Take 1 tablet (5 mg total) by mouth 2 (two) times daily. Elgergawy, Leana Roe, MD  Active   aspirin EC (ASPIRIN LOW DOSE) 81 MG tablet [id number]  Take 1 tablet (81 mg total) by mouth daily. Discontinue in 20 days. Elgergawy, Leana Roe, MD  Active   atorvastatin (LIPITOR) 20 MG tablet [id number] Yes Take 1 tablet (20 mg total) by mouth daily. Elgergawy, Leana Roe, MD Taking Active   Calcium Carbonate (CALCIUM 600 PO) [id number]  Take 1 tablet by mouth daily. [provider]  Active Child  Cholecalciferol (VITAMIN D) 125 MCG (5000 UT) CAPS [id number]  Take 5,000 Units by mouth daily. [provider]  Active Child  Cinnamon 500 MG capsule [id number]  Take 1,000 mg by mouth daily. [provider]  Active Child  citalopram (CELEXA) 10 MG tablet [id number]  Take 10 mg by mouth daily. [provider]  Active Child  clopidogrel (PLAVIX) 75 MG tablet [id number] Yes Take 1 tablet (75 mg total) by mouth daily. Elgergawy, Leana Roe, MD Taking Active   dorzolamide-timolol (COSOPT) 2-0.5 % ophthalmic solution [id number]  Place 1 drop into both eyes 2 (two) times daily. [provider]  Active Child  ezetimibe (ZETIA) 10 MG tablet [id number]  Take 1 tablet (10 mg total) by mouth daily. Baldo Daub, MD  Active Child  hydrocortisone (ANUSOL-HC) 25 MG suppository [id number]  Place 1 suppository (25 mg total) rectally 2 (two) times daily.  Patient not taking: Reported on 06/13/2023   SaguierRamon Dredge, PA-C  Active Child  incobotulinumtoxinA [facility]) 100 units injection 100 Units [id number]   Levert Feinstein, MD  Active   incobotulinumtoxinA [facility]) 100 units injection 100 Units [id number]   Levert Feinstein, MD  Active   latanoprost (XALATAN) 0.005 % ophthalmic solution [id number]  Place 1 drop into both eyes every evening. [provider]  Active Child  levothyroxine (SYNTHROID) 50 MCG tablet [id number]  TAKE 1 TABLET BY MOUTH  DAILY BEFORE BREAKFAST Saguier, Ramon Dredge, PA-C  Active Child  Menaquinone-7 (VITAMIN K2 PO) [id number]  Take 1 tablet by mouth at bedtime. [provider]  Active Child  metoprolol tartrate (LOPRESSOR) 50 MG tablet [id number]  Take 1 tablet (50 mg total) by mouth 2 (two) times daily. Elgergawy, Leana Roe, MD  Active   Omega-3 Fatty Acids (OMEGA 3 PO) [id number]  Take 2 tablets by mouth daily. [provider]  Active Child  Phytosterol Esters (CHOLEST CARE PO) [id number]  Take 1,600 mg by mouth daily. [provider]  Active Child  Probiotic Product (PROBIOTIC DAILY PO) [id number]  Take 1 tablet by mouth at bedtime. [provider]  Active Child  vitamin C (ASCORBIC ACID) 500 MG tablet [id number]  Take 500 mg by mouth daily. [provider]  Active Child           Home Care and Equipment/Supplies: Were Home Health Services Ordered?: Yes Name of Home Health Agency:: [facility]- [facility]; OT; ST:  [phone] Has Agency set up a time to come to your home?: Yes First Home Health Visit Date: 06/18/23 Any new equipment or medical supplies ordered?: No  Functional Questionnaire: Do you need assistance with bathing/showering or dressing?: Yes (son/ caregiver assists as indicated) Do you need assistance with meal preparation?: Yes (son/ caregiver assists as indicated) Do you need assistance with eating?: No Do you have difficulty maintaining continence: No Do you need assistance with getting out of bed/getting out of a chair/moving?: Yes ("Sometimes;" son/ caregiver assists as indicated) Do you have difficulty managing or taking your medications?: Yes (son/ caregiver manages all aspects of medication administration)  Follow up appointments reviewed: PCP Follow-up appointment confirmed?: Yes (care coordination outreach in real-time with scheduling care guide to successfully schedule hospital follow up PCP appointment 06/18/23) Date of PCP follow-up appointment?:  06/18/23 Follow-up Provider: PCP Specialist Hospital Follow-up appointment confirmed?: NA Do you need transportation to your follow-up appointment?: No Do you understand care options if your condition(s) worsen?: Yes-patient verbalized understanding  SDOH Interventions Today    Flowsheet Row Most Recent Value  SDOH Interventions   Food Insecurity Interventions Intervention Not Indicated  Transportation Interventions Intervention Not Indicated  [son provides all transportation]      Caregiver declines need for ongoing/ further care management/ coordination outreach; declines enrollment in 30-day TOC program- declines taking my direct phone number should needs/ concerns arise post-TOC call   Caryl Pina, RN, BSN, CCRN Alumnus RN Care Manager  Transitions of Care  [facility] Population [facility]  [place] [phone]: direct office

## 2023-06-18 ENCOUNTER — Encounter: Payer: Self-pay | Admitting: Medical

## 2023-06-18 ENCOUNTER — Ambulatory Visit: Payer: Medicare HMO | Admitting: Medical

## 2023-06-18 VITALS — BP 135/70 | HR 74 | Resp 18 | Ht <= 58 in | Wt 113.6 lb

## 2023-06-18 DIAGNOSIS — E119 Type 2 diabetes mellitus without complications: Secondary | ICD-10-CM

## 2023-06-18 DIAGNOSIS — R944 Abnormal results of kidney function studies: Secondary | ICD-10-CM

## 2023-06-18 DIAGNOSIS — I10 Essential (primary) hypertension: Secondary | ICD-10-CM

## 2023-06-18 DIAGNOSIS — Z8673 Personal history of transient ischemic attack (TIA), and cerebral infarction without residual deficits: Secondary | ICD-10-CM

## 2023-06-18 DIAGNOSIS — E039 Hypothyroidism, unspecified: Secondary | ICD-10-CM

## 2023-06-18 LAB — COMPREHENSIVE METABOLIC PANEL WITH GFR
ALT: 11 U/L (ref 0–35)
AST: 14 U/L (ref 0–37)
Albumin: 3.8 g/dL (ref 3.5–5.2)
Alkaline Phosphatase: 65 U/L (ref 39–117)
BUN: 19 mg/dL (ref 6–23)
CO2: 27 meq/L (ref 19–32)
Calcium: 9.6 mg/dL (ref 8.4–10.5)
Chloride: 103 meq/L (ref 96–112)
Creatinine, Ser: 0.84 mg/dL (ref 0.40–1.20)
GFR: 61.05 mL/min
Glucose, Bld: 100 mg/dL — ABNORMAL HIGH (ref 70–99)
Potassium: 4 meq/L (ref 3.5–5.1)
Sodium: 139 meq/L (ref 135–145)
Total Bilirubin: 0.8 mg/dL (ref 0.2–1.2)
Total Protein: 6.8 g/dL (ref 6.0–8.3)

## 2023-06-18 LAB — CBC WITH DIFFERENTIAL/PLATELET
Basophils Absolute: 0 10*3/uL (ref 0.0–0.1)
Basophils Relative: 0.4 % (ref 0.0–3.0)
Eosinophils Absolute: 0.1 10*3/uL (ref 0.0–0.7)
Eosinophils Relative: 1.4 % (ref 0.0–5.0)
HCT: 39.2 % (ref 36.0–46.0)
Hemoglobin: 12.6 g/dL (ref 12.0–15.0)
Lymphocytes Relative: 37.2 % (ref 12.0–46.0)
Lymphs Abs: 3.7 10*3/uL (ref 0.7–4.0)
MCHC: 32 g/dL (ref 30.0–36.0)
MCV: 89.6 fL (ref 78.0–100.0)
Monocytes Absolute: 0.7 10*3/uL (ref 0.1–1.0)
Monocytes Relative: 6.6 % (ref 3.0–12.0)
Neutro Abs: 5.4 10*3/uL (ref 1.4–7.7)
Neutrophils Relative %: 54.4 % (ref 43.0–77.0)
Platelets: 335 10*3/uL (ref 150.0–400.0)
RBC: 4.38 Mil/uL (ref 3.87–5.11)
RDW: 15.6 % — ABNORMAL HIGH (ref 11.5–15.5)
WBC: 10 10*3/uL (ref 4.0–10.5)

## 2023-06-18 NOTE — Patient Instructions (Addendum)
Primary hypertension(Good level today) -continue amlodipine 5 mg twice daily and resume metroprol 50 mg daily. - Comp Met (CMET) - CBC w/Diff  Hyperlipidemia -LDL is 150, started on Lipitor 20 mg oral daily, no high intensity statin due to history of statin intolerance, continue with home Zetia on discharge   History of stroke -continue aspirin  81 mg until Jul 04, 2023. Then will just stay on plavix. Call neurologist office for follow up appointment. - CBC w/Diff  Type 2 diabetes mellitus without complication, without long-term current use of insulin (HCC) -good A1c. Can control with diet alone. - CBC w/Diff   Hypothyroidism (acquired)  -- Continue Synthroid    Rt shoulder pain- Use voltaren gel if needed. -can also try lidocaine patch or thermacare patch.  Follow up with me 2 weeks after your see neurologist or sooner if needed

## 2023-06-18 NOTE — Progress Notes (Signed)
Subjective:    Patient ID: Leslie Ward, female    DOB: [birth year], 89 y.o.   MRN: [id number]  HPI Pt in for follow up from hospitalization.  Admit date: 06/11/2023 Discharge date: 06/13/2023   Admitted From: (Home, ALF, ILF, SNF) Disposition:  (Home /facility name / Residential Hospice)   Recommendations for Outpatient Follow-up:  Follow up with PCP in 1-2 weeks Please obtain BMP/CBC in one week Ambulatory referral has been made to neurology  Leslie Ward is a 89 y.o. female with medical history significant for hypertension, type 2 diabetes mellitus, hypothyroidism, and history of ischemic CVA who presents with confusion and speech difficulty.  Her MRI brain significant for acute CVA, she was admitted for further workup.    Patient seemed to be in her usual state when she was speaking on the phone with her son yesterday evening, but he noted her to be confused when he spoke with her by phone again at approximately 10 AM today. A family friend went to check on her, noted that she is typically conversant and oriented, but was not in her usual state.    Acute Ischemic Infarct: left MCA territory 2 small infarcts, etiology: Concerning for cardio embolic source -CT head No acute abnormality.   -CTA head & neck Occlusion of a distal left M3 branch, inferior division, Diminutive and irregular left V1 and V2 segments, Moderate to severe atheromatous stenosis about the right vertebral artery -MRI  Small acute/subacute infarcts in the left sub insular region and posterior left temporal lobe. Remote infarcts in the left occipital lobe, thalami and bilateral cerebellar hemispheres. -2D Echo EF 60 to 65% - LE venous Doppler >> No DVT -Neurology input greatly appreciated, she will need loop recorder as concern for cardioembolic stroke causing her acute CVA, EP has been consulted and loop recorder inserted prior  discharge - HgbA1c 6.1 - aspirin 81 mg daily prior to admission, now on  aspirin 81 mg daily and clopidogrel 75 mg daily for 3 weeks and then plavix  alone.   Hypothyroidism  - Continue Synthroid     Hypertension  -As of hypertension during hospital stay, allow for gradual blood pressure control, overall her blood pressure has been acceptable during hospital stay off meds, so she is instructed to resume her Coreg and metoprolol in a gradual fashion, over the next week, and to continue to hold Micardis to avoid low blood pressure  Hyperlipidemia -LDL is 150, started on Lipitor 20 mg oral daily, no high intensity statin due to history of statin intolerance, continue with home Zetia on discharge  Diabetes mellitus, type II -Controlled with A1c of 6.1, no meds needed   Discharge Diagnoses:  Principal Problem:   Acute encephalopathy Active Problems:   Hypertension   Type 2 diabetes mellitus (HCC)   Hypothyroidism (acquired)   Cerebrovascular accident (CVA) [facility])  Discharge Instructions   Discharge Instructions       Ambulatory referral to Neurology   Complete by: As directed      Follow-up with Dr Terrace Arabia in 4 to 6 weeks.  Patient is Dr. Zannie Ward patient.  Thanks    Diet - low sodium heart healthy   Complete by: As directed      Discharge instructions   Complete by: As directed      Follow with Primary MD Leslie Ward, Ramon Dredge, PA-C in 7 days    Get CBC, CMP,  checked  by Primary MD next visit.     Also on review pt has Implantable  loop recorder implantation. Per son will evaluate if she may have episodes of atrial fibrillation   Pt is on amlodipine 5 mg twice daily. Is to start metoprolol tartrate 50 mg on 06-18-2023.  Some recent rt shoulder for years per son.   Review of Systems  Constitutional:  Negative for chills, fatigue and fever.  HENT:  Negative for congestion.   Respiratory:  Negative for cough, chest tightness, shortness of breath and wheezing.   Cardiovascular:  Negative for chest pain and palpitations.  Gastrointestinal:  Negative for  abdominal pain and diarrhea.  Musculoskeletal:  Negative for back pain and joint swelling.  Neurological:  Negative for dizziness, speech difficulty, weakness and headaches.  Hematological:  Negative for adenopathy. Does not bruise/bleed easily.    Past Medical History:  Diagnosis Date   Adhesive capsulitis of left shoulder 12/22/2018   Bilateral shoulder pain 08/24/2021   Cerebrovascular accident (CVA) (HCC) 10/25/2021   Cervical dystonia 03/08/2019   Xeomin approved diagnosis.   COVID 05/29/2021   Dystonia    Edema 02/11/2019   Ganglion cyst 08/25/2020   Glaucoma    Hyperlipidemia 02/11/2019   Hypertension    Hypothyroidism (acquired) 01/19/2019   Inflammatory arthritis 08/24/2021   Mixed incontinence 03/15/2020   Formatting of this note might be different from the original.  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  Added automatically from request for surgery [id number]   Urinary frequency 08/24/2021   Vertigo 12/01/2019     Social History   Socioeconomic History   Marital status: Widowed    Spouse name: Not on file   Number of children: 2   Years of education: 6 or 7 years of education   Highest education level: Not on file  Occupational History   Occupation: Retired  Tobacco Use   Smoking status: Never   Smokeless tobacco: Never  Vaping Use   Vaping status: Never Used  Substance and Sexual Activity   Alcohol use: No   Drug use: No   Sexual activity: Not on file  Other Topics  Concern   Not on file  Social History Narrative   Lives alone.   Right-handed.   No caffeine use.   Social Determinants of Health   Financial Resource Strain: Low Risk  (10/01/2021)   Overall Financial Resource Strain (CARDIA)    Difficulty of Paying Living Expenses: Not hard at all  Food Insecurity: No Food Insecurity (06/17/2023)   Hunger Vital Sign    Worried About Running Out of Food in the Last Year: Never true    Ran Out of Food in the Last Year: Never true  Transportation Needs: No Transportation Needs (06/17/2023)   PRAPARE - Administrator, Civil Service (Medical): No    Lack of Transportation (Non-Medical): No  Physical Activity: Inactive (10/01/2021)   Exercise Vital Sign    Days of Exercise per Week: 0 days    Minutes of Exercise per Session: 0 min  Stress: No Stress Concern Present (10/01/2021)   [employer]  of Occupational Health - Occupational Stress Questionnaire    Feeling of Stress : Not at all  Social Connections: Moderately Isolated (12/18/2022)   Social Connection and Isolation Panel [NHANES]    Frequency of Communication with Friends and Family: More than three times a week    Frequency of Social Gatherings with Friends and Family: Twice a week    Attends Religious Services: More than 4 times per year    Active Member of [employer] or Organizations: No    Attends Banker Meetings: Never    Marital Status: Widowed  Intimate Partner Violence: Not At Risk (06/12/2023)   Humiliation, Afraid, Rape, and Kick questionnaire    Fear of Current or Ex-Partner: No    Emotionally Abused: No    Physically Abused: No    Sexually Abused: No    Past Surgical History:  Procedure Laterality Date   LOOP RECORDER INSERTION N/A 06/13/2023   Procedure: LOOP RECORDER INSERTION;  Surgeon: Regan Lemming, MD;  Location: [facility] INVASIVE CV [facility];  Service: Cardiovascular;  Laterality: N/A;   NO PAST SURGERIES      Family History  Problem Relation Age of  Onset   Stroke Mother    Heart attack Father    Heart Problems Brother    High blood pressure Maternal Grandmother    Liver disease Neg Hx    Colon cancer Neg Hx    Esophageal cancer Neg Hx     Allergies  Allergen Reactions   Crestor [Rosuvastatin Calcium] Other (See Comments)    "caused me to go into kidney failure"    Current Outpatient Medications on File Prior to Visit  Medication Sig Dispense Refill   amLODipine (NORVASC) 5 MG tablet Take 1 tablet (5 mg total) by mouth 2 (two) times daily.     aspirin EC (ASPIRIN LOW DOSE) 81 MG tablet Take 1 tablet (81 mg total) by mouth daily. Discontinue in 20 days.     atorvastatin (LIPITOR) 20 MG tablet Take 1 tablet (20 mg total) by mouth daily. 30 tablet 0   Calcium Carbonate (CALCIUM 600 PO) Take 1 tablet by mouth daily.     Cholecalciferol (VITAMIN D) 125 MCG (5000 UT) CAPS Take 5,000 Units by mouth daily.     Cinnamon 500 MG capsule Take 1,000 mg by mouth daily.     citalopram (CELEXA) 10 MG tablet Take 10 mg by mouth daily.     clopidogrel (PLAVIX) 75 MG tablet Take 1 tablet (75 mg total) by mouth daily. 30 tablet 1   dorzolamide-timolol (COSOPT) 2-0.5 % ophthalmic solution Place 1 drop into both eyes 2 (two) times daily.     ezetimibe (ZETIA) 10 MG tablet Take 1 tablet (10 mg total) by mouth daily. 30 tablet 1   hydrocortisone (ANUSOL-HC) 25 MG suppository Place 1 suppository (25 mg total) rectally 2 (two) times daily. (Patient not taking: Reported on 06/13/2023) 12 suppository 0   latanoprost (XALATAN) 0.005 % ophthalmic solution Place 1 drop into both eyes every evening.     levothyroxine (SYNTHROID) 50 MCG tablet TAKE 1 TABLET BY MOUTH DAILY BEFORE BREAKFAST 30 tablet 0   Menaquinone-7 (VITAMIN K2 PO) Take 1 tablet by mouth at bedtime.     metoprolol tartrate (LOPRESSOR) 50 MG tablet Take 1 tablet (50 mg total) by mouth 2 (two) times daily.     Omega-3 Fatty Acids (OMEGA 3 PO) Take 2 tablets by mouth daily.     Phytosterol Esters  (CHOLEST CARE PO) Take 1,600 mg  by mouth daily.     Probiotic Product (PROBIOTIC DAILY PO) Take 1 tablet by mouth at bedtime.     vitamin C (ASCORBIC ACID) 500 MG tablet Take 500 mg by mouth daily.     Current Facility-Administered Medications on File Prior to Visit  Medication Dose Route Frequency Provider Last Rate Last Admin   incobotulinumtoxinA (XEOMIN) 100 units injection 100 Units  100 Units Intramuscular Q90 days Levert Feinstein, MD       incobotulinumtoxinA [facility]) 100 units injection 100 Units  100 Units Intramuscular Q90 days Levert Feinstein, MD   100 Units at 05/03/22 2307    BP 135/70   Pulse 74   Resp 18   Ht 4\' 9"  (1.448 m)   Wt 113 lb 9.6 oz (51.5 kg)   SpO2 98%   BMI 24.58 kg/m        Objective:   Physical Exam  General Mental Status- Alert. General Appearance- Not in acute distress.   Skin General: Color- Normal Color. Moisture- Normal Moisture.  Neck Carotid Arteries- Normal color. Moisture- Normal Moisture. No carotid bruits. No JVD.  Chest and Lung Exam Auscultation: Breath Sounds:-Normal.  Cardiovascular Auscultation:Rythm- Regular, rate and rythm Murmurs & Other Heart Sounds:Auscultation of the heart reveals- No Murmurs.  Abdomen Inspection:-Inspeection Normal. Palpation/Percussion:Note:No mass. Palpation and Percussion of the abdomen reveal- Non Tender, Non Distended + BS, no rebound or guarding.   Neurologic Cranial Nerve exam:- CN III-XII intact(No nystagmus), symmetric smile. No slurred speech. Communicating well.  Drift Test:- No drift. Finger to Nose:- Normal/Intact Strength:- 5/5 equal and symmetric strength both upper and lower extremities.       Assessment & Plan:   Patient Instructions  Primary hypertension -continue amlodipine 5 mg twice daily and resume metroprol 50 mg daily. - Comp Met (CMET) - CBC w/Diff  Hyperlipidemia -LDL is 150, started on Lipitor 20 mg oral daily, no high intensity statin due to history of statin  intolerance, continue with home Zetia on discharge   History of stroke -continue aspirin until Jul 04, 2023. Then will just stay on plavix. Call neurologist office for follow up appointment. - CBC w/Diff  Type 2 diabetes mellitus without complication, without long-term current use of insulin (HCC) -good A1c. Can control with diet alone. - CBC w/Diff   Hypothyroidism (acquired)  -- Continue Synthroid    Rt shoulder pain- Use voltaren gel if needed. -can also try lidocaine patch or thermacare patch.  Follow up with me 2 weeks after your see neurologist or sooner if needed     [name], PA-C   Time spent with patient today was  40 minutes which consisted of chart review, discussing diagnosis, work up, treatment and documentation.

## 2023-06-23 ENCOUNTER — Telehealth: Payer: Self-pay | Admitting: Medical

## 2023-06-23 ENCOUNTER — Ambulatory Visit: Payer: Medicare HMO | Admitting: Internal Medicine

## 2023-06-23 DIAGNOSIS — M1711 Unilateral primary osteoarthritis, right knee: Secondary | ICD-10-CM

## 2023-06-23 DIAGNOSIS — Z79899 Other long term (current) drug therapy: Secondary | ICD-10-CM

## 2023-06-23 DIAGNOSIS — M1990 Unspecified osteoarthritis, unspecified site: Secondary | ICD-10-CM

## 2023-06-23 DIAGNOSIS — R350 Frequency of micturition: Secondary | ICD-10-CM

## 2023-06-23 NOTE — Telephone Encounter (Signed)
Corrie Dandy, Speech therapist with [facility], called to request verbal orders for speech therapy with the frequency of 1x week for 3 weeks, skip one week, and then 1x week for 3 weeks. Please call and advise at [phone].

## 2023-06-24 NOTE — Telephone Encounter (Signed)
VO given.

## 2023-06-26 ENCOUNTER — Telehealth: Payer: Self-pay | Admitting: Medical

## 2023-06-26 NOTE — Telephone Encounter (Signed)
Caller/Agency: [facility] ([facility])  Callback Number: [phone]  Requesting OT/PT/Skilled Nursing/Social Work/Speech Therapy: OT Frequency: 1x for 3 weeks for mobility and safety.  Also diabetic teaching

## 2023-06-26 NOTE — Telephone Encounter (Signed)
VO given.

## 2023-06-29 ENCOUNTER — Other Ambulatory Visit: Payer: Self-pay | Admitting: Medical

## 2023-06-29 ENCOUNTER — Other Ambulatory Visit: Payer: Self-pay | Admitting: Cardiology

## 2023-07-02 ENCOUNTER — Other Ambulatory Visit (HOSPITAL_BASED_OUTPATIENT_CLINIC_OR_DEPARTMENT_OTHER): Payer: Self-pay

## 2023-07-03 ENCOUNTER — Encounter: Payer: Self-pay | Admitting: Medical

## 2023-07-04 ENCOUNTER — Other Ambulatory Visit: Payer: Self-pay

## 2023-07-04 MED ORDER — ATORVASTATIN CALCIUM 20 MG PO TABS: 20.0000 mg | ORAL_TABLET | Freq: Every day | ORAL | 3 refills | Status: AC

## 2023-07-04 NOTE — Addendum Note (Signed)
Addended by: Gwenevere Abbot on: 07/04/2023 11:54 AM   Modules accepted: Orders

## 2023-07-05 ENCOUNTER — Other Ambulatory Visit: Payer: Self-pay | Admitting: Neurology

## 2023-07-07 ENCOUNTER — Encounter: Payer: Self-pay | Admitting: Internal Medicine

## 2023-07-07 ENCOUNTER — Encounter: Payer: Self-pay | Admitting: Medical

## 2023-07-08 ENCOUNTER — Telehealth: Payer: Self-pay

## 2023-07-08 NOTE — Telephone Encounter (Signed)
Pt has sent transmission successfully and knows how to use the app on the phone properly.

## 2023-07-09 ENCOUNTER — Ambulatory Visit (INDEPENDENT_AMBULATORY_CARE_PROVIDER_SITE_OTHER): Payer: Medicare HMO | Admitting: Medical

## 2023-07-09 ENCOUNTER — Ambulatory Visit (HOSPITAL_BASED_OUTPATIENT_CLINIC_OR_DEPARTMENT_OTHER)
Admission: RE | Admit: 2023-07-09 | Discharge: 2023-07-09 | Disposition: A | Discharge status: Home / Self Care | Payer: Medicare HMO | Source: Ambulatory Visit | Attending: Medical | Admitting: Medical

## 2023-07-09 ENCOUNTER — Telehealth: Payer: Self-pay

## 2023-07-09 ENCOUNTER — Encounter: Payer: Self-pay | Admitting: Medical

## 2023-07-09 VITALS — BP 165/85 | HR 64 | Resp 18 | Ht <= 58 in | Wt 114.0 lb

## 2023-07-09 DIAGNOSIS — R059 Cough, unspecified: Secondary | ICD-10-CM

## 2023-07-09 DIAGNOSIS — R6883 Chills (without fever): Secondary | ICD-10-CM | POA: Diagnosis not present

## 2023-07-09 DIAGNOSIS — M2550 Pain in unspecified joint: Secondary | ICD-10-CM | POA: Diagnosis not present

## 2023-07-09 DIAGNOSIS — M791 Myalgia, unspecified site: Secondary | ICD-10-CM | POA: Diagnosis not present

## 2023-07-09 DIAGNOSIS — R700 Elevated erythrocyte sedimentation rate: Secondary | ICD-10-CM | POA: Diagnosis not present

## 2023-07-09 DIAGNOSIS — R350 Frequency of micturition: Secondary | ICD-10-CM | POA: Diagnosis not present

## 2023-07-09 DIAGNOSIS — I10 Essential (primary) hypertension: Secondary | ICD-10-CM

## 2023-07-09 LAB — POC URINALSYSI DIPSTICK (AUTOMATED)
Bilirubin, UA: NEGATIVE
Blood, UA: NEGATIVE
Glucose, UA: NEGATIVE
Ketones, UA: NEGATIVE
Nitrite, UA: NEGATIVE
Protein, UA: NEGATIVE
Spec Grav, UA: 1.015 (ref 1.010–1.025)
Urobilinogen, UA: 0.2 U/dL
pH, UA: 6 (ref 5.0–8.0)

## 2023-07-09 LAB — CBC WITH DIFFERENTIAL/PLATELET
Basophils Absolute: 0 10*3/uL (ref 0.0–0.1)
Basophils Relative: 0.5 % (ref 0.0–3.0)
Eosinophils Absolute: 0.1 10*3/uL (ref 0.0–0.7)
Eosinophils Relative: 1.2 % (ref 0.0–5.0)
HCT: 40.3 % (ref 36.0–46.0)
Hemoglobin: 13.1 g/dL (ref 12.0–15.0)
Lymphocytes Relative: 35.1 % (ref 12.0–46.0)
Lymphs Abs: 3.1 10*3/uL (ref 0.7–4.0)
MCHC: 32.4 g/dL (ref 30.0–36.0)
MCV: 89.8 fL (ref 78.0–100.0)
Monocytes Absolute: 0.5 10*3/uL (ref 0.1–1.0)
Monocytes Relative: 5.1 % (ref 3.0–12.0)
Neutro Abs: 5.2 10*3/uL (ref 1.4–7.7)
Neutrophils Relative %: 58.1 % (ref 43.0–77.0)
Platelets: 351 10*3/uL (ref 150.0–400.0)
RBC: 4.49 Mil/uL (ref 3.87–5.11)
RDW: 15.2 % (ref 11.5–15.5)
WBC: 8.9 10*3/uL (ref 4.0–10.5)

## 2023-07-09 LAB — POCT INFLUENZA A/B
Influenza A, POC: NEGATIVE
Influenza B, POC: NEGATIVE

## 2023-07-09 LAB — COMPREHENSIVE METABOLIC PANEL
ALT: 8 U/L (ref 0–35)
AST: 13 U/L (ref 0–37)
Albumin: 4.1 g/dL (ref 3.5–5.2)
Alkaline Phosphatase: 70 U/L (ref 39–117)
BUN: 23 mg/dL (ref 6–23)
CO2: 28 meq/L (ref 19–32)
Calcium: 9.7 mg/dL (ref 8.4–10.5)
Chloride: 104 meq/L (ref 96–112)
Creatinine, Ser: 0.77 mg/dL (ref 0.40–1.20)
GFR: 67.75 mL/min (ref >60.00)
Glucose, Bld: 100 mg/dL — ABNORMAL HIGH (ref 70–99)
Potassium: 4.1 meq/L (ref 3.5–5.1)
Sodium: 139 meq/L (ref 135–145)
Total Bilirubin: 0.7 mg/dL (ref 0.2–1.2)
Total Protein: 7.7 g/dL (ref 6.0–8.3)

## 2023-07-09 LAB — POC COVID19 BINAXNOW: SARS Coronavirus 2 Ag: NEGATIVE

## 2023-07-09 LAB — SEDIMENTATION RATE: Sed Rate: 51 mm/h — ABNORMAL HIGH (ref 0–30)

## 2023-07-09 MED ORDER — METHYLPREDNISOLONE 4 MG PO TABS: ORAL_TABLET | ORAL | 0 refills | Status: DC

## 2023-07-09 MED ORDER — AMLODIPINE BESYLATE 5 MG PO TABS: ORAL_TABLET | ORAL | 11 refills | Status: DC

## 2023-07-09 NOTE — Telephone Encounter (Signed)
Pt came into office today , son is requesting 3 refills on these medications because it has been a delay on getting medications refilled recently   Amlodipine 5 mg Plavix 75 mg Metoprolol 50 mg

## 2023-07-09 NOTE — Progress Notes (Signed)
Subjective:    Patient ID: Leslie Ward, female    DOB: 1930/10/04, 89 y.o.   MRN: [id number]  HPI  Pt in for evaluation for joint pain all over. She has had this before and seemed to occur last time she had covid. She had sed rate elevation post covid years ago. She did follow up with rheumatologist in past. Recently pain did restart when meloxicam stopped. That was stopped around time she was given plavix.   Pt son thinks plavix is causing joint pain. Meloxicam controlled pt pain but had to be stopped.  Rheumatologist took her off prednisone due concerns about glaucoma.  Pt was sweating a lot last night. Was severe. Mild cough today on interview.  Htn- pt is on metoprolol. Also on amlodipine per son. Recently pt states refil was denied. Has not been on med for 2 days. Dr. Lynnell Dike recenlty declined telmisartan.   Review of Systems  Constitutional:  Positive for chills and fever. Negative for fatigue.  HENT:  Negative for congestion.   Respiratory:  Positive for cough. Negative for shortness of breath and wheezing.   Cardiovascular:  Negative for chest pain and palpitations.  Gastrointestinal:  Negative for abdominal pain.  Genitourinary:  Negative for dysuria and frequency.  Musculoskeletal:  Positive for myalgias.  Neurological:  Negative for facial asymmetry, speech difficulty and light-headedness.  Hematological:  Negative for adenopathy. Does not bruise/bleed easily.  Psychiatric/Behavioral:  Negative for behavioral problems and confusion.            Objective:   Physical Exam  General Mental Status- Alert. General Appearance- Not in acute distress.   Skin General: Color- Normal Color. Moisture- Normal Moisture.  Neck No JVD.  Chest and Lung Exam Auscultation: Breath Sounds:-Normal.  Cardiovascular Auscultation:Rythm- Regular. Murmurs & Other Heart Sounds:Auscultation of the heart reveals- No Murmurs.  Abdomen Inspection:-Inspeection  Normal. Palpation/Percussion:Note:No mass. Palpation and Percussion of the abdomen reveal- Non Tender, Non Distended + BS, no rebound or guarding.    Neurologic Cranial Nerve exam:- CN III-XII intact(No nystagmus), symmetric smile. Strength:- 5/5 equal and symmetric strength both upper and lower extremities.   Back- no cva pain.   Heent- no sinus pressure. Normal pharynx.    Assessment & Plan:   Patient Instructions  Chills and cough. As approach holiday assess for viral cause and evaluate if has pneumonia. -minimal cough so not rx med for cough presently. Covid and flu negative. If pneumonia on xray give antitibioic. - DG Chest 2 View; Future - CBC with Differential/Platelet - Comp Met (CMET) -- POC COVID-19 - POCT Influenza A/B    Elevated sed rate, myalgia  and Arthralgia -follow labs and likley rx just 3 day taper medrol. Benefit vs risk discussed. No good option presently. Pain seems to have come on with dc of mobic but pt is on plavix for hx of stroke. Also glaucoma dx. Tylenol not adequate and want to avoid narcotic. - Sedimentation rate - Comp Met (CMET)     Hypertension, unspecified type  -pt is to continue metoprol and restart amlodipine. Bp elevation likely from being off med last 2 days. Will see how bp responds. May need to be back on telmisartan in future.  If signs/symptoms worsen over holiday then be seen in ED. In future after holidays may need to reach out to neurology and eye MD to get opinion on nsaid vs prednisone.   Follow up Monday or Tuesday with me or sooner if needed     [name], PA-C  Time spent with patient today was  41 minutes which consisted of chart revdiew, discussing diagnosis, work up treatment and documentation.

## 2023-07-09 NOTE — Patient Instructions (Addendum)
Chills and cough. As approach holiday assess for viral cause and evaluate if has pneumonia. -minimal cough so not rx med for cough presently. Covid and flu negative. If pneumonia on xray give antitibioic. - DG Chest 2 View; Future - CBC with Differential/Platelet - Comp Met (CMET) -- POC COVID-19 - POCT Influenza A/B    Elevated sed rate, myalgia  and Arthralgia -follow labs and likley rx just 3 day taper medrol. Benefit vs risk discussed. No good option presently. Pain seems to have come on with dc of mobic but pt is on plavix for hx of stroke. Also glaucoma dx. Tylenol not adequate and want to avoid narcotic. - Sedimentation rate - Comp Met (CMET)     Hypertension, unspecified type  -pt is to continue metoprol and restart amlodipine. Bp elevation likely from being off med last 2 days. Will see how bp responds. May need to be back on telmisartan in future.  If signs/symptoms worsen over holiday then be seen in ED. In future after holidays may need to reach out to neurology and eye MD to get opinion on nsaid vs prednisone.   Follow up Monday or Tuesday with me or sooner if needed

## 2023-07-09 NOTE — Telephone Encounter (Signed)
Alert received from [employer] for ILR alert for AF/AF event occurred 11/24 @ 17:43. Duration , mean HR 78, ASA only per EPIC. Route to triage high alert per protocol.  Attempted to contact patient to refer to AF clinic. No answer, LMTCB.

## 2023-07-10 LAB — URINE CULTURE
MICRO NUMBER:: 15787645
Result:: NO GROWTH
SPECIMEN QUALITY:: ADEQUATE

## 2023-07-14 ENCOUNTER — Telehealth: Payer: Self-pay | Admitting: Neurology

## 2023-07-14 NOTE — Telephone Encounter (Signed)
Spoke to Baker who is on DPR/pts caregiver. [name] does speak [place].   Advised AF noted on ILR and recommend AF clinic referral to discuss OAC. [name] is agreeable.

## 2023-07-14 NOTE — Telephone Encounter (Signed)
Pt's son is asking if Dr Terrace Arabia will manage pt's  clopidogrel (PLAVIX) 75 MG tablet .  He was informed it has been over a year since pt has been seen, he is aware of the importance of pt keeping her upcoming appointment. Please call to discuss request.

## 2023-07-18 ENCOUNTER — Encounter (HOSPITAL_COMMUNITY): Payer: Self-pay | Admitting: Internal Medicine

## 2023-07-18 ENCOUNTER — Ambulatory Visit (HOSPITAL_COMMUNITY)
Admission: RE | Admit: 2023-07-18 | Discharge: 2023-07-18 | Disposition: A | Discharge status: Home / Self Care | Payer: Medicare HMO | Source: Ambulatory Visit | Attending: Internal Medicine | Admitting: Internal Medicine

## 2023-07-18 VITALS — BP 118/60 | HR 68 | Ht <= 58 in | Wt 115.0 lb

## 2023-07-18 DIAGNOSIS — H409 Unspecified glaucoma: Secondary | ICD-10-CM | POA: Diagnosis not present

## 2023-07-18 DIAGNOSIS — Z7901 Long term (current) use of anticoagulants: Secondary | ICD-10-CM | POA: Diagnosis not present

## 2023-07-18 DIAGNOSIS — Z7902 Long term (current) use of antithrombotics/antiplatelets: Secondary | ICD-10-CM | POA: Insufficient documentation

## 2023-07-18 DIAGNOSIS — E039 Hypothyroidism, unspecified: Secondary | ICD-10-CM | POA: Insufficient documentation

## 2023-07-18 DIAGNOSIS — I4891 Unspecified atrial fibrillation: Secondary | ICD-10-CM | POA: Diagnosis not present

## 2023-07-18 DIAGNOSIS — Z8673 Personal history of transient ischemic attack (TIA), and cerebral infarction without residual deficits: Secondary | ICD-10-CM | POA: Insufficient documentation

## 2023-07-18 DIAGNOSIS — I700 Atherosclerosis of aorta: Secondary | ICD-10-CM | POA: Insufficient documentation

## 2023-07-18 DIAGNOSIS — I10 Essential (primary) hypertension: Secondary | ICD-10-CM | POA: Insufficient documentation

## 2023-07-18 DIAGNOSIS — I480 Paroxysmal atrial fibrillation: Secondary | ICD-10-CM | POA: Diagnosis not present

## 2023-07-18 DIAGNOSIS — D6869 Other thrombophilia: Secondary | ICD-10-CM | POA: Insufficient documentation

## 2023-07-18 DIAGNOSIS — E119 Type 2 diabetes mellitus without complications: Secondary | ICD-10-CM | POA: Insufficient documentation

## 2023-07-18 HISTORY — DX: Paroxysmal atrial fibrillation: I48.0

## 2023-07-18 HISTORY — DX: Other thrombophilia: D68.69

## 2023-07-18 MED ORDER — APIXABAN 2.5 MG PO TABS: 2.5000 mg | ORAL_TABLET | Freq: Two times a day (BID) | ORAL | 1 refills | Status: DC

## 2023-07-18 NOTE — Progress Notes (Addendum)
Primary Care Physician: [name], PA-C Primary Cardiologist: Norman Herrlich, MD Electrophysiologist: None     Referring Physician: Device clinic     Leslie Ward is a 89 y.o. female with a history of HTN, ischemic CVA, aortic atherosclerosis by CT imaging, cervical dystonia, chronic tremor, glaucoma, hypothyroidism, T2DM, and paroxysmal atrial fibrillation who presents for consultation in the [facility]. Recent hospital admission 10/30-11/1 for CVA and underwent ILR placement on 06/13/23. Device clinic alert for new PAF (22 minute episode) on 07/09/23. Review of discharge shows patient was previously on ASA, plavix added upon discharge, and plan to have patient remain on plavix only after 3 weeks of dual therapy. Patient is not on anticoagulation. She has a CHADS2VASC score of 8.  On evaluation today, she is currently in NSR. Patient and her son note she has a lot of arthritic complaints, specifically upper shoulders bilaterally, and quality of life has been a challenge since stopping meloxicam for ASA/plavix therapy. She is currently still taking dual therapy ASA and plavix daily. She is currently on prednisone taper for the arthritic pains but do note history of glaucoma.   Today, she denies symptoms of palpitations, chest pain, shortness of breath, orthopnea, PND, lower extremity edema, dizziness, presyncope, syncope, snoring, daytime somnolence, bleeding, or neurologic sequela. The patient is tolerating medications without difficulties and is otherwise without complaint today.   she has a BMI of Body mass index is 24.89 kg/m.[name] Filed Weights   07/18/23 1007  Weight: 52.2 kg    Current Outpatient Medications  Medication Sig Dispense Refill   amLODipine (NORVASC) 5 MG tablet 1 tab po bid 60 tablet 11   aspirin EC (ASPIRIN LOW DOSE) 81 MG tablet Take 1 tablet (81 mg total) by mouth daily. Discontinue in 20 days.     atorvastatin (LIPITOR) 20 MG  tablet Take 1 tablet (20 mg total) by mouth daily. 90 tablet 3   Calcium Carbonate (CALCIUM 600 PO) Take 1 tablet by mouth daily.     Cholecalciferol (VITAMIN D) 125 MCG (5000 UT) CAPS Take 5,000 Units by mouth daily.     Cinnamon 500 MG capsule Take 1,000 mg by mouth daily.     citalopram (CELEXA) 10 MG tablet Take 10 mg by mouth daily.     clopidogrel (PLAVIX) 75 MG tablet Take 1 tablet (75 mg total) by mouth daily. 30 tablet 1   dorzolamide-timolol (COSOPT) 2-0.5 % ophthalmic solution Place 1 drop into both eyes 2 (two) times daily.     hydrocortisone (ANUSOL-HC) 25 MG suppository Place 1 suppository (25 mg total) rectally 2 (two) times daily. 12 suppository 0   latanoprost (XALATAN) 0.005 % ophthalmic solution Place 1 drop into both eyes every evening.     levothyroxine (SYNTHROID) 50 MCG tablet TAKE 1 TABLET BY MOUTH DAILY BEFORE BREAKFAST 30 tablet 0   Menaquinone-7 (VITAMIN K2 PO) Take 1 tablet by mouth at bedtime.     metoprolol tartrate (LOPRESSOR) 50 MG tablet Take 1 tablet (50 mg total) by mouth 2 (two) times daily.     Omega-3 Fatty Acids (OMEGA 3 PO) Take 2 tablets by mouth daily.     Phytosterol Esters (CHOLEST CARE PO) Take 1,600 mg by mouth daily.     Probiotic Product (PROBIOTIC DAILY PO) Take 1 tablet by mouth at bedtime.     vitamin C (ASCORBIC ACID) 500 MG tablet Take 500 mg by mouth daily.     Current Facility-Administered Medications  Medication Dose Route Frequency  Provider Last Rate Last Admin   incobotulinumtoxinA (XEOMIN) 100 units injection 100 Units  100 Units Intramuscular Q90 days Levert Feinstein, MD       incobotulinumtoxinA [facility]) 100 units injection 100 Units  100 Units Intramuscular Q90 days Levert Feinstein, MD   100 Units at 05/03/22 2307    Atrial Fibrillation Management history:  Previous antiarrhythmic drugs: none Previous cardioversions: none Previous ablations: none Anticoagulation history: none   ROS- All systems are reviewed and negative except as per  the HPI above.  Physical Exam: BP 118/60   Pulse 68   Ht 4\' 9"  (1.448 m)   Wt 52.2 kg   BMI 24.89 kg/m   GEN: Well nourished, well developed in no acute distress NECK: No JVD; No carotid bruits CARDIAC: Regular rate and rhythm, no murmurs, rubs, gallops RESPIRATORY:  Clear to auscultation without rales, wheezing or rhonchi  ABDOMEN: Soft, non-tender, non-distended EXTREMITIES:  No edema; No deformity   EKG today demonstrates  Vent. rate 68 BPM PR interval 138 ms QRS duration 70 ms QT/QTcB 374/397 ms P-R-T axes 59 24 6 Normal sinus rhythm Minimal voltage criteria for LVH, may be normal variant ( R in aVL ) Cannot rule out Anterior infarct , age undetermined Abnormal ECG When compared with ECG of 12-Jun-2023 09:52, PREVIOUS ECG IS PRESENT  Echo 06/12/23 demonstrated  1. Left ventricular ejection fraction, by estimation, is 60 to 65%. The  left ventricle has normal function. The left ventricle has no regional  wall motion abnormalities. Left ventricular diastolic parameters are  consistent with Grade I diastolic  dysfunction (impaired relaxation).   2. Right ventricular systolic function is normal. The right ventricular  size is normal. Tricuspid regurgitation signal is inadequate for assessing  PA pressure.   3. Left atrial size was moderately dilated.   4. The mitral valve was not well visualized. No evidence of mitral valve  regurgitation.   5. The aortic valve was not well visualized. Aortic valve regurgitation  is not visualized.   6. The inferior vena cava is normal in size with greater than 50%  respiratory variability, suggesting right atrial pressure of 3 mmHg.   ASSESSMENT & PLAN CHA2DS2-VASc Score = 8  The patient's score is based upon: CHF History: 0 HTN History: 1 Diabetes History: 1 Stroke History: 2 Vascular Disease History: 1 Age Score: 2 Gender Score: 1       ASSESSMENT AND PLAN: Paroxysmal Atrial Fibrillation (ICD10:  I48.0) The patient's  CHA2DS2-VASc score is 8, indicating a 10.8% annual risk of stroke.    She is currently in NSR. Given recent CVA and recent device alert, likely vascular insult related to new finding of Afib on ILR. Our guideline recommendation for patient given Chadsvasc score and her age and weight would be to begin Eliquis 2.5 mg BID and stop ASA and stop plavix. Patient's son notes that quality of life for patient right now is very low due to arthritic pains and immobility. He notes prednisone is not a good long term solution given glaucoma and is hesitant for patient to begin Eliquis due to needing better quality of life. It appears she could be more mobile and able to do daily activities with meloxicam. After discussion, patient's son elects to not begin patient on Eliquis at this time. He asked that I send a Rx of Eliquis to pharmacy, and I have instructed they please call us after discussion with PCP/Rheumatology to help confirm what they end up doing for treatment. I will send  a 30 day supply without refills.   I made patient and patient's son aware that due to not taking recommended anticoagulant, patient is at increased risk for stroke secondary to atrial fibrillation. They voiced understanding and will not begin Eliquis at this time because want to discuss pain treatment plan with PCP/Rheumatology first.   Given discharge summary review, patient is to stop ASA and continue plavix as dual therapy was for 3 weeks per notes.   Secondary Hypercoagulable State (ICD10:  D68.69) The patient is at significant risk for stroke/thromboembolism based upon her CHA2DS2-VASc Score of 8.    If patient elects to begin Eliquis 2.5 mg BID in the future, would stop plavix and plan for 1 month f/u to draw CBC.   Recommended patient's son to please call clinic with confirmation of treatment plan.    Lake Bells, PA-C  Afib Clinic [facility] [address] [place], [place] [address] [phone]

## 2023-07-18 NOTE — Patient Instructions (Signed)
Stop aspirin  Recommend Eliquis 2.5mg  twice a day with new afib to prevent stroke (if you start Eliquis then will stop plavix)   If you do not start Eliquis - continue Plavix only.  If you start Eliquis call for 1 month follow up

## 2023-07-21 ENCOUNTER — Ambulatory Visit (INDEPENDENT_AMBULATORY_CARE_PROVIDER_SITE_OTHER): Payer: Medicare HMO | Admitting: Family Medicine

## 2023-07-21 ENCOUNTER — Encounter: Payer: Self-pay | Admitting: Family Medicine

## 2023-07-21 VITALS — BP 134/60 | HR 64 | Temp 97.9°F | Resp 18 | Ht <= 58 in | Wt 115.4 lb

## 2023-07-21 DIAGNOSIS — Z9181 History of falling: Secondary | ICD-10-CM

## 2023-07-21 DIAGNOSIS — M2550 Pain in unspecified joint: Secondary | ICD-10-CM

## 2023-07-21 NOTE — Progress Notes (Signed)
Established Patient Office Visit  Subjective   Patient ID: Leslie Ward, female    DOB: 1931-07-23  Age: 89 y.o. MRN: [id number]  Chief Complaint  Patient presents with   Medication Management    Pt would like discuss medication for abnormal heartbeat    HPI Discussed the use of AI scribe software for clinical note transcription with the patient, who gave verbal consent to proceed.  History of Present Illness   The patient, with a history of stroke, glaucoma, and chronic body pain, presents with ongoing, widespread pain. The pain is described as affecting the arms, shoulders, and legs. The patient's pain was previously managed with prednisone, which was reportedly very effective, making the patient feel 'like Wonder Woman.' However, due to the impact on the patient's glaucoma, the prednisone was discontinued by a rheumatologist and replaced with meloxicam.  Following a stroke in October, the patient was started on blood thinners, necessitating the discontinuation of meloxicam due to potential interactions. The patient's pain has since been uncontrolled, leading to a significant decrease in her quality of life. The patient's recent lab work showed elevated sedimentation rates, indicating ongoing inflammation, although the levels were lower than previous results.  The patient lives independently in an apartment adjacent to her child's house and has a small dog for companionship. The patient reports being able to perform daily activities independently, but notes that mobility has slowed due to the use of a walker. The patient's child has been exploring natural remedies to help manage the patient's pain, but there is concern about potential interactions with the patient's current medications.      Patient Active Problem List   Diagnosis Date Noted   Paroxysmal atrial fibrillation (HCC) 07/18/2023   Hypercoagulable state due to paroxysmal atrial fibrillation (HCC) 07/18/2023   Acute  encephalopathy 06/11/2023   Osteoarthritis of right knee 12/18/2022   Glaucoma 11/26/2021   Cerebrovascular accident (CVA) (HCC) 10/25/2021   History of stroke 10/2021   Syncope 10/09/2021   Primary open angle glaucoma (POAG) of both eyes, moderate stage 09/12/2021   Sedimentation rate elevation 08/24/2021   Bilateral shoulder pain 08/24/2021   Inflammatory arthritis 08/24/2021   Urinary frequency 08/24/2021   COVID 05/29/2021   Post-COVID chronic fatigue 05/29/2021   Post-COVID chronic joint pain 05/29/2021   Post-COVID chronic muscle pain 05/29/2021   BPPV (benign paroxysmal positional vertigo), right 01/03/2021   Thyroid disease    Ganglion cyst 08/25/2020   Mixed incontinence 03/15/2020   Urgency incontinence 03/15/2020   Blepharitis of upper and lower eyelids of both eyes 01/07/2020   Dermatochalasis of both upper eyelids 01/07/2020   Hypertensive retinopathy of both eyes 01/07/2020   Macular RPE mottling 01/07/2020   Meibomian gland dysfunction (MGD) of both eyes 01/07/2020   Vitreous syneresis of both eyes 01/07/2020   Vertigo 12/01/2019   Cervical dystonia 03/08/2019   Hyperlipidemia 02/11/2019   Edema 02/11/2019   Hypertension 01/19/2019   Type 2 diabetes mellitus (HCC) 01/19/2019   Hypothyroidism (acquired) 01/19/2019   Neck pain 01/19/2019   Dystonia 01/19/2019   Adhesive capsulitis of left shoulder 12/22/2018   Past Medical History:  Diagnosis Date   Adhesive capsulitis of left shoulder 12/22/2018   Bilateral shoulder pain 08/24/2021   Cerebrovascular accident (CVA) (HCC) 10/25/2021   Cervical dystonia 03/08/2019   Xeomin approved diagnosis.   COVID 05/29/2021   Dystonia    Edema 02/11/2019   Ganglion cyst 08/25/2020   Glaucoma    Hyperlipidemia 02/11/2019   Hypertension  Hypothyroidism (acquired) 01/19/2019   Inflammatory arthritis 08/24/2021   Mixed incontinence 03/15/2020   Formatting of this note might be different from the original.  Added  automatically from request for surgery [id number]   Neck pain 01/19/2019   Post-COVID chronic fatigue 05/29/2021   Post-COVID chronic joint pain 05/29/2021   Post-COVID chronic muscle pain 05/29/2021   Primary open angle glaucoma (POAG) of both eyes, moderate stage 09/12/2021   Sedimentation rate elevation 08/24/2021   Stroke (HCC) 10/2021   Syncope 10/09/2021   Thyroid disease    Type 2 diabetes mellitus (HCC) 01/19/2019   Urgency incontinence 03/15/2020   Formatting of this note might be different from the original.  Added automatically from request for surgery [id number]   Urinary frequency 08/24/2021   Vertigo 12/01/2019   Past Surgical History:  Procedure Laterality Date   LOOP RECORDER INSERTION N/A 06/13/2023   Procedure: LOOP RECORDER INSERTION;  Surgeon: Regan Lemming, MD;  Location: [facility];  Service: Cardiovascular;  Laterality: N/A;   NO PAST SURGERIES     Social History   Tobacco Use   Smoking status: Never   Smokeless tobacco: Never   Tobacco comments:    Never smoker 07/18/23  Vaping Use   Vaping status: Never Used  Substance Use Topics   Alcohol use: No   Drug use: No   Social History   Socioeconomic History   Marital status: Widowed    Spouse name: Not on file   Number of children: 2   Years of education: 6 or 7 years of education   Highest education level: Not on file  Occupational History   Occupation: Retired  Tobacco Use   Smoking status: Never   Smokeless tobacco: Never   Tobacco comments:    Never smoker 07/18/23  Vaping Use   Vaping status: Never Used  Substance and Sexual Activity   Alcohol use: No   Drug use: No   Sexual activity: Not on file  Other Topics Concern   Not on file  Social History Narrative   Lives alone.   Right-handed.   No caffeine use.   Social Determinants of Health   Financial Resource Strain: Low Risk  (10/01/2021)   Overall Financial Resource Strain (CARDIA)    Difficulty of Paying Living  Expenses: Not hard at all  Food Insecurity: No Food Insecurity (06/17/2023)   Hunger Vital Sign    Worried About Running Out of Food in the Last Year: Never true    Ran Out of Food in the Last Year: Never true  Transportation Needs: No Transportation Needs (06/17/2023)   PRAPARE - Administrator, Civil Service (Medical): No    Lack of Transportation (Non-Medical): No  Physical Activity: Inactive (10/01/2021)   Exercise Vital Sign    Days of Exercise per Week: 0 days    Minutes of Exercise per Session: 0 min  Stress: No Stress Concern Present (10/01/2021)   [employer] of Occupational Health - Occupational Stress Questionnaire    Feeling of Stress : Not at all  Social Connections: Moderately Isolated (12/18/2022)   Social Connection and Isolation Panel [NHANES]    Frequency of Communication with Friends and Family: More than three times a week    Frequency of Social Gatherings with Friends and Family: Twice a week    Attends Religious Services: More than 4 times per year    Active Member of [employer] or Organizations: No    Attends Banker Meetings: Never  Marital Status: Widowed  Intimate Partner Violence: Not At Risk (06/12/2023)   Humiliation, Afraid, Rape, and Kick questionnaire    Fear of Current or Ex-Partner: No    Emotionally Abused: No    Physically Abused: No    Sexually Abused: No   Family Status  Relation Name Status   Mother  Deceased   Father  Deceased   Sister  Deceased   Sister  Deceased   Brother  Alive   Brother  Deceased   MGM  (Not Specified)   Neg Hx  (Not Specified)  No partnership data on file   Family History  Problem Relation Age of Onset   Stroke Mother    Heart attack Father    Heart Problems Brother    High blood pressure Maternal Grandmother    Liver disease Neg Hx    Colon cancer Neg Hx    Esophageal cancer Neg Hx    Allergies  Allergen Reactions   Crestor [Rosuvastatin Calcium] Other (See Comments)    "caused  me to go into kidney failure"      Review of Systems  Constitutional:  Negative for fever and malaise/fatigue.  HENT:  Negative for congestion.   Eyes:  Negative for blurred vision.  Respiratory:  Negative for shortness of breath.   Cardiovascular:  Negative for chest pain, palpitations and leg swelling.  Gastrointestinal:  Negative for abdominal pain, blood in stool and nausea.  Genitourinary:  Negative for dysuria and frequency.  Musculoskeletal:  Positive for joint pain. Negative for falls.  Skin:  Negative for rash.  Neurological:  Negative for dizziness, loss of consciousness and headaches.  Endo/Heme/Allergies:  Negative for environmental allergies.  Psychiatric/Behavioral:  Negative for depression. The patient is not nervous/anxious.       Objective:     BP 134/60 (BP Location: Left Arm, Patient Position: Sitting, Cuff Size: Normal)   Pulse 64   Temp 97.9 F (36.6 C) (Oral)   Resp 18   Ht 4\' 9"  (1.448 m)   Wt 115 lb 6.4 oz (52.3 kg)   SpO2 96%   BMI 24.97 kg/m  BP Readings from Last 3 Encounters:  07/21/23 134/60  07/18/23 118/60  07/09/23 (!) 165/85   Wt Readings from Last 3 Encounters:  07/21/23 115 lb 6.4 oz (52.3 kg)  07/18/23 115 lb (52.2 kg)  07/09/23 114 lb (51.7 kg)   SpO2 Readings from Last 3 Encounters:  07/21/23 96%  07/09/23 99%  06/18/23 98%      Physical Exam Vitals and nursing note reviewed.  Constitutional:      General: She is not in acute distress.    Appearance: Normal appearance. She is well-developed.  HENT:     Head: Normocephalic and atraumatic.  Eyes:     General: No scleral icterus.       Right eye: No discharge.        Left eye: No discharge.  Cardiovascular:     Rate and Rhythm: Normal rate and regular rhythm.     Heart sounds: No murmur heard. Pulmonary:     Effort: Pulmonary effort is normal. No respiratory distress.     Breath sounds: Normal breath sounds.  Musculoskeletal:        General: Normal range of motion.      Cervical back: Normal range of motion and neck supple.     Right lower leg: No edema.     Left lower leg: No edema.  Skin:    General: Skin is  warm and dry.  Neurological:     Mental Status: She is alert and oriented to person, place, and time.  Psychiatric:        Mood and Affect: Mood normal.        Behavior: Behavior normal.        Thought Content: Thought content normal.        Judgment: Judgment normal.      No results found for any visits on 07/21/23.  Last CBC Lab Results  Component Value Date   WBC 8.9 07/09/2023   HGB 13.1 07/09/2023   HCT 40.3 07/09/2023   MCV 89.8 07/09/2023   MCH 28.8 06/13/2023   RDW 15.2 07/09/2023   PLT 351.0 07/09/2023   Last metabolic panel Lab Results  Component Value Date   GLUCOSE 100 (H) 07/09/2023   NA 139 07/09/2023   K 4.1 07/09/2023   CL 104 07/09/2023   CO2 28 07/09/2023   BUN 23 07/09/2023   CREATININE 0.77 07/09/2023   GFR 67.75 07/09/2023   CALCIUM 9.7 07/09/2023   PROT 7.7 07/09/2023   ALBUMIN 4.1 07/09/2023   LABGLOB 2.4 11/09/2020   AGRATIO 1.8 11/09/2020   BILITOT 0.7 07/09/2023   ALKPHOS 70 07/09/2023   AST 13 07/09/2023   ALT 8 07/09/2023   ANIONGAP 9 06/13/2023   Last lipids Lab Results  Component Value Date   CHOL 233 (H) 06/12/2023   HDL 63 06/12/2023   LDLCALC 150 (H) 06/12/2023   TRIG 102 06/12/2023   CHOLHDL 3.7 06/12/2023   Last vitamin D No results found for: "25OHVITD2", "25OHVITD3", "VD25OH" Last vitamin B12 and Folate Lab Results  Component Value Date   VITAMINB12 318 03/29/2020      The ASCVD Risk score (Arnett DK, et al., 2019) failed to calculate for the following reasons:   The 2019 ASCVD risk score is only valid for ages 53 to 21   The patient has a prior MI or stroke diagnosis    Assessment & Plan:   Problem List Items Addressed This Visit   None Visit Diagnoses     Arthralgia, unspecified joint    -  Primary   Relevant Orders   Ambulatory referral to Physical  Therapy   At risk for fall due to comorbid condition       Relevant Orders   Ambulatory referral to Physical Therapy      Assessment and Plan    Chronic Pain and Inflammation   Due to chronic pain and inflammation affecting her arm, shoulder, and legs, and elevated sed rates indicating ongoing inflammation, we will recommend Tylenol Arthritis as needed, adhering to the bottle's directions. We suggest over-the-counter lidocaine patches (Salonpas) or Aspercreme with lidocaine for localized pain relief and advise the use of a heating pad with a timer for additional relief. A referral to rheumatology for further evaluation and management is necessary. Previously managed with prednisone, it was discontinued due to an exacerbation of glaucoma, and meloxicam is contraindicated due to concurrent Eliquis use. We discussed the risks of liver damage with excessive acetaminophen use and the benefits of non-systemic pain relief options, with a preference for maintaining quality of life without excessive pain.  Atrial Fibrillation   For atrial fibrillation, she will continue Eliquis as prescribed to prevent thromboembolic events. We educated on the importance of avoiding additional blood thinners such as turmeric, fish oil, and ibuprofen, emphasizing the necessity of Eliquis to prevent clot formation and subsequent stroke or myocardial infarction. The patient and her  family are aware of the risks associated with blood thinners, including increased bruising and bleeding.  Glaucoma   Glaucoma was exacerbated by prednisone use, leading to its discontinuation. We emphasized the need to avoid steroids like prednisone and Depo-Medrol due to their impact on intraocular pressure and will continue to avoid these steroids.  General Health Maintenance   At 89 years old, with recent health complications, living with her son in an adjacent apartment and having a small dog for companionship, we recommend vitamin D3  supplementation without vitamin K. We advise against the use of turmeric and omega-3 supplements due to their blood-thinning properties and consider physical therapy or integrative therapies for additional support in managing pain and mobility. We discussed the benefits of vitamin D3 supplementation without vitamin K and considered physical therapy or integrative therapies for additional support.  Follow-up   She will follow up with rheumatology on Wednesday and check with the nurse to provide a new handicap parking letter.       No follow-ups on file.    Donato Schultz, DO

## 2023-07-23 ENCOUNTER — Ambulatory Visit: Payer: Medicare HMO | Admitting: Internal Medicine

## 2023-07-28 ENCOUNTER — Other Ambulatory Visit: Payer: Self-pay | Admitting: Medical

## 2023-08-08 ENCOUNTER — Telehealth: Payer: Self-pay

## 2023-08-08 NOTE — Telephone Encounter (Signed)
LINQ alert received. 1 AF episode on 08/02/23 at 20:23, 1 hr 4 min, V rates <50 bpm ~ 35%, Eliquis prescribed at 07/18/23 OV but not started due to sons' request to hold off until seeing Rheumatologist.   RX was sent in but son was to call back to AF clinic to discuss starting.  Forwarding to J. Nelva Bush PA-C to update on episode.

## 2023-08-08 NOTE — Telephone Encounter (Addendum)
Per Son - patient has stopped plavix and is on Eliquis 2.5mg  BID. Pt has follow up with Landry Mellow PA 1/10.

## 2023-08-12 ENCOUNTER — Ambulatory Visit (INDEPENDENT_AMBULATORY_CARE_PROVIDER_SITE_OTHER): Payer: Medicare HMO

## 2023-08-12 ENCOUNTER — Ambulatory Visit: Payer: Medicare HMO

## 2023-08-12 DIAGNOSIS — I639 Cerebral infarction, unspecified: Secondary | ICD-10-CM

## 2023-08-14 LAB — CUP PACEART REMOTE DEVICE CHECK
Date Time Interrogation Session: 20241231095722
Implantable Pulse Generator Implant Date: 20241101

## 2023-08-22 ENCOUNTER — Ambulatory Visit (HOSPITAL_COMMUNITY)
Admission: RE | Admit: 2023-08-22 | Discharge: 2023-08-22 | Disposition: A | Discharge status: Home / Self Care | Payer: 59 | Source: Ambulatory Visit | Attending: Internal Medicine | Admitting: Internal Medicine

## 2023-08-22 VITALS — BP 164/70 | HR 61 | Ht <= 58 in | Wt 115.4 lb

## 2023-08-22 DIAGNOSIS — I480 Paroxysmal atrial fibrillation: Secondary | ICD-10-CM | POA: Insufficient documentation

## 2023-08-22 DIAGNOSIS — E039 Hypothyroidism, unspecified: Secondary | ICD-10-CM | POA: Diagnosis not present

## 2023-08-22 DIAGNOSIS — Z8673 Personal history of transient ischemic attack (TIA), and cerebral infarction without residual deficits: Secondary | ICD-10-CM | POA: Diagnosis not present

## 2023-08-22 DIAGNOSIS — Z7901 Long term (current) use of anticoagulants: Secondary | ICD-10-CM | POA: Insufficient documentation

## 2023-08-22 DIAGNOSIS — I10 Essential (primary) hypertension: Secondary | ICD-10-CM | POA: Insufficient documentation

## 2023-08-22 DIAGNOSIS — E119 Type 2 diabetes mellitus without complications: Secondary | ICD-10-CM | POA: Diagnosis not present

## 2023-08-22 DIAGNOSIS — D6869 Other thrombophilia: Secondary | ICD-10-CM | POA: Diagnosis not present

## 2023-08-22 DIAGNOSIS — I4891 Unspecified atrial fibrillation: Secondary | ICD-10-CM

## 2023-08-22 LAB — CBC
HCT: 39 % (ref 36.0–46.0)
Hemoglobin: 12.4 g/dL (ref 12.0–15.0)
MCH: 29.2 pg (ref 26.0–34.0)
MCHC: 31.8 g/dL (ref 30.0–36.0)
MCV: 91.8 fL (ref 80.0–100.0)
Platelets: 307 10*3/uL (ref 150–400)
RBC: 4.25 MIL/uL (ref 3.87–5.11)
RDW: 15 % (ref 11.5–15.5)
WBC: 9.9 10*3/uL (ref 4.0–10.5)
nRBC: 0 % (ref 0.0–0.2)

## 2023-08-22 NOTE — Progress Notes (Signed)
 Primary Care Physician: Dorina Loving, PA-C Primary Cardiologist: Redell Leiter, MD Electrophysiologist: None     Referring Physician: Device clinic     Leslie Ward is a 89 y.o. female with a history of HTN, ischemic CVA, aortic atherosclerosis by CT imaging, cervical dystonia, chronic tremor, glaucoma, hypothyroidism, T2DM, and paroxysmal atrial fibrillation who presents for consultation in the [facility]. Recent hospital admission 10/30-11/1 for CVA and underwent ILR placement on 06/13/23. Device clinic alert for new PAF (22 minute episode) on 07/09/23. Review of discharge shows patient was previously on ASA, plavix  added upon discharge, and plan to have patient remain on plavix  only after 3 weeks of dual therapy. Patient is not on anticoagulation. She has a CHADS2VASC score of 8.  On evaluation today, she is currently in NSR. Patient and her son note she has a lot of arthritic complaints, specifically upper shoulders bilaterally, and quality of life has been a challenge since stopping meloxicam  for ASA/plavix  therapy. She is currently still taking dual therapy ASA and plavix  daily. She is currently on prednisone  taper for the arthritic pains but do note history of glaucoma.   On follow up 08/22/23, she is currently in NSR. Patient ultimately decided after last OV to begin Eliquis  2.5 mg BID (noted in phone call 08/08/23). She had an episode of Afib on 08/02/23 for 1 hour. ILR review by Dr. Inocencio on 08/14/23 showed overall Afib burden of 0.2%. No bleeding issues on Eliquis . She continues with arthritic pains in both shoulders and is taking tylenol  per son to help with this.   Today, she denies symptoms of palpitations, chest pain, shortness of breath, orthopnea, PND, lower extremity edema, dizziness, presyncope, syncope, snoring, daytime somnolence, bleeding, or neurologic sequela. The patient is tolerating medications without difficulties and is otherwise  without complaint today.   she has a BMI of Body mass index is 24.97 kg/m.[name] Filed Weights   08/22/23 1035  Weight: 52.3 kg     Current Outpatient Medications  Medication Sig Dispense Refill   amLODipine  (NORVASC ) 5 MG tablet 1 tab po bid 60 tablet 11   apixaban  (ELIQUIS ) 2.5 MG TABS tablet Take 1 tablet (2.5 mg total) by mouth 2 (two) times daily. 60 tablet 1   atorvastatin  (LIPITOR) 20 MG tablet Take 1 tablet (20 mg total) by mouth daily. 90 tablet 3   Calcium  Carbonate (CALCIUM  600 PO) Take 1 tablet by mouth daily.     Cholecalciferol (VITAMIN D) 125 MCG (5000 UT) CAPS Take 5,000 Units by mouth daily.     Cinnamon 500 MG capsule Take 1,000 mg by mouth daily.     dorzolamide -timolol  (COSOPT ) 2-0.5 % ophthalmic solution Place 1 drop into both eyes 2 (two) times daily.     hydrocortisone  (ANUSOL -HC) 25 MG suppository Place 1 suppository (25 mg total) rectally 2 (two) times daily. (Patient taking differently: Place 25 mg rectally as needed.) 12 suppository 0   latanoprost (XALATAN) 0.005 % ophthalmic solution Place 1 drop into both eyes every evening.     levothyroxine  (SYNTHROID ) 50 MCG tablet TAKE 1 TABLET BY MOUTH DAILY BEFORE BREAKFAST 30 tablet 0   Menaquinone-7 (VITAMIN K2 PO) Take 1 tablet by mouth at bedtime.     metoprolol  tartrate (LOPRESSOR ) 50 MG tablet Take 1 tablet (50 mg total) by mouth 2 (two) times daily.     Phytosterol Esters (CHOLEST CARE PO) Take 1,600 mg by mouth daily.     Probiotic Product (PROBIOTIC DAILY PO) Take 1 tablet  by mouth at bedtime.     vitamin C (ASCORBIC ACID) 500 MG tablet Take 500 mg by mouth daily.     Current Facility-Administered Medications  Medication Dose Route Frequency Provider Last Rate Last Admin   incobotulinumtoxinA  (XEOMIN ) 100 units injection 100 Units  100 Units Intramuscular Q90 days Onita Duos, MD       incobotulinumtoxinA  (XEOMIN ) 100 units injection 100 Units  100 Units Intramuscular Q90 days Onita Duos, MD   100 Units at  05/03/22 2307    Atrial Fibrillation Management history:  Previous antiarrhythmic drugs: none Previous cardioversions: none Previous ablations: none Anticoagulation history: Eliquis  2.5 mg BID   ROS- All systems are reviewed and negative except as per the HPI above.  Physical Exam: BP (!) 164/70   Pulse 61   Ht 4' 9 (1.448 m)   Wt 52.3 kg   BMI 24.97 kg/m   GEN- The patient is well appearing, alert and oriented x 3 today.   Neck - no JVD or carotid bruit noted Lungs- Clear to ausculation bilaterally, normal work of breathing Heart- Regular rate and rhythm, no murmurs, rubs or gallops, PMI not laterally displaced Extremities- no clubbing, cyanosis, or edema Skin - no rash or ecchymosis noted   EKG today demonstrates  Vent. rate 61 BPM PR interval 140 ms QRS duration 70 ms QT/QTcB 378/380 ms P-R-T axes 45 26 -3 Normal sinus rhythm Cannot rule out Anterior infarct , age undetermined Abnormal ECG When compared with ECG of 18-Jul-2023 10:10, PREVIOUS ECG IS PRESENT  Echo 06/12/23 demonstrated  1. Left ventricular ejection fraction, by estimation, is 60 to 65%. The  left ventricle has normal function. The left ventricle has no regional  wall motion abnormalities. Left ventricular diastolic parameters are  consistent with Grade I diastolic  dysfunction (impaired relaxation).   2. Right ventricular systolic function is normal. The right ventricular  size is normal. Tricuspid regurgitation signal is inadequate for assessing  PA pressure.   3. Left atrial size was moderately dilated.   4. The mitral valve was not well visualized. No evidence of mitral valve  regurgitation.   5. The aortic valve was not well visualized. Aortic valve regurgitation  is not visualized.   6. The inferior vena cava is normal in size with greater than 50%  respiratory variability, suggesting right atrial pressure of 3 mmHg.   ASSESSMENT & PLAN CHA2DS2-VASc Score = 8  The patient's score is  based upon: CHF History: 0 HTN History: 1 Diabetes History: 1 Stroke History: 2 Vascular Disease History: 1 Age Score: 2 Gender Score: 1       ASSESSMENT AND PLAN: Paroxysmal Atrial Fibrillation (ICD10:  I48.0) The patient's CHA2DS2-VASc score is 8, indicating a 10.8% annual risk of stroke.    She is currently in NSR. Overall low burden, will continue with conservative observation via ILR.   Secondary Hypercoagulable State (ICD10:  D68.69) The patient is at significant risk for stroke/thromboembolism based upon her CHA2DS2-VASc Score of 8.   Continue Eliquis  2.5 mg BID. CBC drawn today.   Follow up 1 year Afib clinic.    Terra Pac, PA-C  Afib Clinic [facility] [address] [place], [place] [address] [phone]

## 2023-08-26 ENCOUNTER — Other Ambulatory Visit: Payer: Self-pay | Admitting: Medical

## 2023-08-26 ENCOUNTER — Ambulatory Visit: Payer: 59 | Attending: Family Medicine

## 2023-08-26 ENCOUNTER — Institutional Professional Consult (permissible substitution): Payer: Medicare HMO | Admitting: Neurology

## 2023-08-26 DIAGNOSIS — M6281 Muscle weakness (generalized): Secondary | ICD-10-CM | POA: Insufficient documentation

## 2023-08-26 DIAGNOSIS — R2681 Unsteadiness on feet: Secondary | ICD-10-CM | POA: Insufficient documentation

## 2023-08-26 DIAGNOSIS — M25511 Pain in right shoulder: Secondary | ICD-10-CM | POA: Insufficient documentation

## 2023-08-26 DIAGNOSIS — M25512 Pain in left shoulder: Secondary | ICD-10-CM | POA: Insufficient documentation

## 2023-08-26 DIAGNOSIS — G8929 Other chronic pain: Secondary | ICD-10-CM | POA: Insufficient documentation

## 2023-09-04 NOTE — Progress Notes (Signed)
 Office Visit Note  Patient: Leslie Ward             Date of Birth: 1930/11/22           MRN: [id number]             PCP: Dorina Dallas RIGGERS Referring: Dorina Dallas RIGGERS Visit Date: 09/16/2023   Subjective:  Follow-up (Patient's family member states the joint pain and body pain needs to be addressed. Patient's family member states the patient was taken off the Meloxicam  in october after a stroke. Patient is now taking tylenol  arthritis and needs something stronger to help with the pain that will work with blood thinners as well. )   Discussed the use of AI scribe software for clinical note transcription with the patient, who gave verbal consent to proceed.  History of Present Illness   Leslie Ward is a 89 year old female with osteoarthritis who presents with worsening arthritis symptoms after discontinuing meloxicam  due to stroke and requiring blood thinner use.  Over the past few months, she has experienced worsening arthritis symptoms after discontinuing her previous medications due to a stroke and subsequent blood thinner therapy. The pain is primarily located in her arms and shoulders, significantly impacting her ability to dress and perform daily activities. The pain is pervasive, affecting her ability to lift her arms and perform tasks.  She manages her pain with Tylenol  Arthritis, taking one 650 mg pill in the morning and one at night. On days when the pain is more severe, she takes two pills. Despite maintaining her intake within safe limits, she notes that Tylenol  is not as effective as her previous medications. Steroids were stopped due to exacerbation of glaucoma previously. NSAIDs now stopped for [facility].  She experiences significant pain in her wrist, for which she uses a wrist brace and applies heat. She finds her walker heavy and difficult to use due to her wrist pain.  She is undergoing physical therapy to aid with joint movement and alleviate pain. She expresses  fatigue and difficulty performing daily activities due to her pain.      Previous HPI 12/18/2022 Leslie Ward is a 89 y.o. female here for follow up for chronic joint pain with possible seronegative inflammatory arthritis on low-dose prednisone  2.5 mg daily.  She had interval lab test with complete metabolic panel from December that was fine and blood pressure has been fine on the steroids.  Had concern due to ophthalmology exam with worsening intraocular pressure it was reported this may be steroid sensitive with possible risk to her vision with continued use.  So she is currently wondering whether there is any alternative as she had significant pain off of the prednisone  but does not want to risk blindness as potential side effect.  More recently she is also experiencing some right-sided flank pain she does not recall any sort of fall or injury to the area not having GI symptoms and no skin changes seen.  She has not noticed any dysuria or hematuria, has increased frequency but this is not entirely new for her does not have history of frequent urinary tract infections.   Previous HPI 06/12/22 Leslie Ward is a 89 y.o. female here for follow up for evaluation of joint pain involving neck, shoulders, and wrists with elevated sedimentation rate. She has tapered down the prednisone  dose down to 1 mg once daily which she continues and feels this is helpful. Her right knee has been painful particularly in the past 2 weeks.  Pain is worst along the inside edge worst at night. She has not seen any visible swelling or erythema.   Previous HPI 01/09/2022 Leslie Ward is a 89 y.o. female here for follow up for evaluation of joint pain involving neck, shoulders, and wrists with elevated sedimentation rate.  She had several complications in the past few months including a hospitalization for new small strokes.  Treatment for her glaucoma the symptoms are improving.  Since her last visit she took the  prednisone  5 mg pretty consistently until last week.  She did not pick up the latest prescription awaiting plans from our visit but has not had a significant return of symptoms for about 1 week.   Previous HPI 08/24/2021 Leslie Ward is a 89 y.o. female here for evaluation of joint pain involving neck, shoulders, and wrists with elevated sedimentation rate.  She was ill with COVID in August of last year without severe acute complications and responded well to treatment with paxlovid .  However afterwards developed severe joint pain and stiffness in multiple areas worst affected in her neck shoulders and knees.  Her joint pains are worst first thing in the morning with a partial degree of improvement during the day.  She never noticed obvious visible swelling or overlying skin changes in affected areas. Besides the joint pains she is experiencing vertigo symptoms since her illness. She has some chronic osteoarthritis changes and dystonia with previous neck botox injections but nothing similar to the current symptoms. Imaging did not demonstrate any obvious related changes lab results showing very highly elevated ESR and CRP. She also developed probably UTI with E. Faecalis on urine culture which was treated during this time. Last month she resumed on prednisone  at 5 mg daily dose which was controlling symptoms well but stopped it this week prior to our visit. She notices symptoms come back within about 2 days after stopping any steroids.   Her son Aloysius is present at our visit today.   Labs reviewed 08/2021 ESR 75   04/2021 ANA neg RF neg ESR >130 CRP 152.1   Review of Systems  Constitutional:  Negative for fatigue.  HENT:  Negative for mouth sores and mouth dryness.   Eyes:  Negative for dryness.  Respiratory:  Negative for shortness of breath.   Cardiovascular:  Negative for chest pain and palpitations.  Gastrointestinal:  Negative for blood in stool, constipation and diarrhea.  Endocrine:  Negative for increased urination.  Genitourinary:  Negative for involuntary urination.  Musculoskeletal:  Positive for joint pain, gait problem, joint pain, myalgias, morning stiffness, muscle tenderness and myalgias. Negative for joint swelling and muscle weakness.  Skin:  Negative for color change, rash, hair loss and sensitivity to sunlight.  Allergic/Immunologic: Negative for susceptible to infections.  Neurological:  Negative for dizziness and headaches.  Hematological:  Negative for swollen glands.  Psychiatric/Behavioral:  Negative for depressed mood and sleep disturbance. The patient is not nervous/anxious.     PMFS History:  Patient Active Problem List   Diagnosis Date Noted   Paroxysmal atrial fibrillation (HCC) 07/18/2023   Hypercoagulable state due to paroxysmal atrial fibrillation (HCC) 07/18/2023   Acute encephalopathy 06/11/2023   Osteoarthritis of right knee 12/18/2022   Glaucoma 11/26/2021   Cerebrovascular accident (CVA) (HCC) 10/25/2021   History of stroke 10/2021   Syncope 10/09/2021   Primary open angle glaucoma (POAG) of both eyes, moderate stage 09/12/2021   Sedimentation rate elevation 08/24/2021   Bilateral shoulder pain 08/24/2021   Inflammatory arthritis 08/24/2021  Urinary frequency 08/24/2021   COVID 05/29/2021   Post-COVID chronic fatigue 05/29/2021   Post-COVID chronic joint pain 05/29/2021   Post-COVID chronic muscle pain 05/29/2021   BPPV (benign paroxysmal positional vertigo), right 01/03/2021   Thyroid  disease    Ganglion cyst 08/25/2020   Mixed incontinence 03/15/2020   Urgency incontinence 03/15/2020   Blepharitis of upper and lower eyelids of both eyes 01/07/2020   Dermatochalasis of both upper eyelids 01/07/2020   Hypertensive retinopathy of both eyes 01/07/2020   Macular RPE mottling 01/07/2020   Meibomian gland dysfunction (MGD) of both eyes 01/07/2020   Vitreous syneresis of both eyes 01/07/2020   Vertigo 12/01/2019   Cervical  dystonia 03/08/2019   Hyperlipidemia 02/11/2019   Edema 02/11/2019   Hypertension 01/19/2019   Type 2 diabetes mellitus (HCC) 01/19/2019   Hypothyroidism (acquired) 01/19/2019   Neck pain 01/19/2019   Dystonia 01/19/2019   Adhesive capsulitis of left shoulder 12/22/2018    Past Medical History:  Diagnosis Date   Adhesive capsulitis of left shoulder 12/22/2018   Bilateral shoulder pain 08/24/2021   Cerebrovascular accident (CVA) (HCC) 10/25/2021   Cervical dystonia 03/08/2019   Xeomin  approved diagnosis.   COVID 05/29/2021   Dystonia    Edema 02/11/2019   Ganglion cyst 08/25/2020   Glaucoma    Hyperlipidemia 02/11/2019   Hypertension    Hypothyroidism (acquired) 01/19/2019   Inflammatory arthritis 08/24/2021   Mixed incontinence 03/15/2020   Formatting of this note might be different from the original.  Added automatically from request for surgery [id number]   Neck pain 01/19/2019   Post-COVID chronic fatigue 05/29/2021   Post-COVID chronic joint pain 05/29/2021   Post-COVID chronic muscle pain 05/29/2021   Primary open angle glaucoma (POAG) of both eyes, moderate stage 09/12/2021   Sedimentation rate elevation 08/24/2021   Stroke (HCC) 10/2021   Stroke (HCC) 05/2023   Syncope 10/09/2021   Thyroid  disease    Type 2 diabetes mellitus (HCC) 01/19/2019   Urgency incontinence 03/15/2020   Formatting of this note might be different from the original.  Added automatically from request for surgery [id number]   Urinary frequency 08/24/2021   Vertigo 12/01/2019    Family History  Problem Relation Age of Onset   Stroke Mother    Heart attack Father    Heart Problems Brother    High blood pressure Maternal Grandmother    Liver disease Neg Hx    Colon cancer Neg Hx    Esophageal cancer Neg Hx    Past Surgical History:  Procedure Laterality Date   LOOP RECORDER INSERTION N/A 06/13/2023   Procedure: LOOP RECORDER INSERTION;  Surgeon: Inocencio Soyla Lunger, MD;  Location: [facility];  Service: Cardiovascular;  Laterality: N/A;   NO PAST SURGERIES     Social History   Social History Narrative   Lives alone.   Right-handed.   No caffeine  use.   Immunization History  Administered Date(s) Administered   Fluad Quad(high Dose 65+) 06/11/2019, 06/13/2020   Influenza-Unspecified 07/12/2021, 06/12/2022   Moderna Covid-19 Vaccine Bivalent Booster 48yrs & up 06/12/2021   Moderna SARS-COV2 Booster Vaccination 07/21/2020   PFIZER(Purple Top)SARS-COV-2 Vaccination 12/20/2019, 01/10/2020     Objective: Vital Signs: BP 124/64 (BP Location: Right Arm, Patient Position: Sitting, Cuff Size: Normal)   Pulse 69   Resp 14   Ht 4' 9 (1.448 m)   Wt 117 lb (53.1 kg)   BMI 25.32 kg/m    Physical Exam Cardiovascular:     Rate and Rhythm:  Normal rate and regular rhythm.  Pulmonary:     Effort: Pulmonary effort is normal.     Breath sounds: Normal breath sounds.  Skin:    General: Skin is warm and dry.     Findings: Bruising present.  Neurological:     Mental Status: She is alert.     Comments: Positional tremor  Psychiatric:        Mood and Affect: Mood normal.      Musculoskeletal Exam:  Shoulders guarding against full overhead abduction, tenderness to pressure extending to upper arms some pain with resisted abduction and external rotation, no weakness no palpable effusion Elbows full ROM no tenderness or swelling Wearing wrist brace, some pain with full movement no focal tenderness or palpable effusion Fingers full ROM no tenderness or swelling Knees full ROM, mild medial joint line tenderness, no palpable effusion Ankles full ROM no tenderness or swelling    Investigation: No additional findings.  Imaging: CUP PACEART REMOTE DEVICE CHECK Result Date: 09/17/2023 ILR summary report received. Battery status OK. Normal device function. No new symptom, tachy, brady, or pause episodes. No new AF episodes. Monthly summary reports and ROV/PRN. [facility],  [facility]    Recent Labs: Lab Results  Component Value Date   WBC 9.9 08/22/2023   HGB 12.4 08/22/2023   PLT 307 08/22/2023   NA 139 07/09/2023   K 4.1 07/09/2023   CL 104 07/09/2023   CO2 28 07/09/2023   GLUCOSE 100 (H) 07/09/2023   BUN 23 07/09/2023   CREATININE 0.77 07/09/2023   BILITOT 0.7 07/09/2023   ALKPHOS 70 07/09/2023   AST 13 07/09/2023   ALT 8 07/09/2023   PROT 7.7 07/09/2023   ALBUMIN 4.1 07/09/2023   CALCIUM  9.7 07/09/2023   GFRAA >60 03/22/2020    Speciality Comments: No specialty comments available.  Procedures:  Large Joint Inj: bilateral subacromial bursa on 09/16/2023 3:40 PM Indications: pain Details: 25 G 1.5 in needle, lateral approach Medications (Right): 1.5 mL lidocaine  1 %; 40 mg triamcinolone  acetonide 40 MG/ML Medications (Left): 1.5 mL lidocaine  1 %; 40 mg triamcinolone  acetonide 40 MG/ML Outcome: tolerated well, no immediate complications Procedure, treatment alternatives, risks and benefits explained, specific risks discussed. Consent was given by the patient. Immediately prior to procedure a time out was called to verify the correct patient, procedure, equipment, support staff and site/side marked as required. Patient was prepped and draped in the usual sterile fashion.     Allergies: Crestor [rosuvastatin calcium ]   Assessment / Plan:     Visit Diagnoses: Inflammatory arthritis - Discussed risk and benefits of low-dose steroids, prednisone  discontinued. - Plan: Large Joint Inj: bilateral subacromial bursa Worsening pain since discontinuation of NSAIDs due to initiation of blood thinners. Currently managing pain with Tylenol  650mg  BID, which provides some relief. -Continue Tylenol  650mg  BID, can increase to TID PRN on days with increased pain. -Consider use of compression gloves for hand pain.  Chronic pain of both shoulders - Plan: Large Joint Inj: bilateral subacromial bursa Appears to be combination of OA and chronic bursitis/tendinopathy.  This seems to be the worst problem area and worst pain on exam today. Recommending local steroid injection for decreased side effect risks as detailed above. -B/L subacromial bursa injection today  Anticoagulation On blood thinners, limiting options for oral pain management due to risk of gastrointestinal bleeding.  Physical Therapy Currently participating in physical therapy for joint movements. -Continue physical therapy regimen.   Orders: Orders Placed This Encounter  Procedures   Large  Joint Inj: bilateral subacromial bursa   No orders of the defined types were placed in this encounter.    Follow-Up Instructions: Return in about 3 months (around 12/14/2023) for RA/OA inj f/u 3mos.   Lonni LELON Ester, MD  Note - This record has been created using [employer].  Chart creation errors have been sought, but may not always  have been located. Such creation errors do not reflect on  the standard of medical care.

## 2023-09-05 NOTE — Therapy (Signed)
OUTPATIENT PHYSICAL THERAPY LOWER EXTREMITY EVALUATION   Patient Name: Lenea Bywater MRN: [id number] DOB:1930-10-24, 89 y.o., female Today's Date: 09/10/2023  END OF SESSION:  PT End of Session - 09/10/23 0956     Visit Number 1    Date for PT Re-Evaluation 11/19/23    Authorization Type [employer] & Medicaid    Progress Note Due on Visit 10    PT Start Time 1317    PT Stop Time 1402    PT Time Calculation (min) 45 min    Activity Tolerance Patient tolerated treatment well    Behavior During Therapy [facility] for tasks assessed/performed             Past Medical History:  Diagnosis Date   Adhesive capsulitis of left shoulder 12/22/2018   Bilateral shoulder pain 08/24/2021   Cerebrovascular accident (CVA) (HCC) 10/25/2021   Cervical dystonia 03/08/2019   [employer] approved diagnosis.   COVID 05/29/2021   Dystonia    Edema 02/11/2019   Ganglion cyst 08/25/2020   Glaucoma    Hyperlipidemia 02/11/2019   Hypertension    Hypothyroidism (acquired) 01/19/2019   Inflammatory arthritis 08/24/2021   Mixed incontinence 03/15/2020   Formatting of this note might be different from the original.  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  Added automatically from request for surgery [id number]   Urinary frequency 08/24/2021   Vertigo 12/01/2019   Past Surgical History:  Procedure Laterality Date   LOOP RECORDER INSERTION N/A 06/13/2023   Procedure: LOOP RECORDER INSERTION;  Surgeon: Regan Lemming, MD;  Location: [facility];  Service: Cardiovascular;  Laterality: N/A;   NO PAST SURGERIES     Patient Active Problem List   Diagnosis Date Noted   Paroxysmal atrial fibrillation (HCC) 07/18/2023   Hypercoagulable state due to paroxysmal atrial fibrillation (HCC) 07/18/2023   Acute encephalopathy 06/11/2023   Osteoarthritis of right knee 12/18/2022   Glaucoma 11/26/2021   Cerebrovascular accident (CVA) (HCC) 10/25/2021   History of stroke 10/2021   Syncope 10/09/2021   Primary open angle glaucoma (POAG) of both eyes, moderate stage 09/12/2021   Sedimentation rate elevation 08/24/2021   Bilateral shoulder pain 08/24/2021   Inflammatory arthritis 08/24/2021   Urinary frequency 08/24/2021   COVID 05/29/2021   Post-COVID chronic fatigue 05/29/2021   Post-COVID chronic joint pain 05/29/2021   Post-COVID chronic muscle pain 05/29/2021   BPPV (benign paroxysmal positional vertigo), right 01/03/2021   Thyroid disease    Ganglion cyst 08/25/2020   Mixed incontinence 03/15/2020   Urgency incontinence 03/15/2020   Blepharitis of upper and lower eyelids of both eyes 01/07/2020   Dermatochalasis of both upper eyelids 01/07/2020   Hypertensive retinopathy of both eyes 01/07/2020   Macular RPE mottling 01/07/2020   Meibomian gland dysfunction (MGD) of both eyes 01/07/2020   Vitreous syneresis of both eyes 01/07/2020   Vertigo 12/01/2019   Cervical dystonia 03/08/2019   Hyperlipidemia 02/11/2019   Edema 02/11/2019   Hypertension 01/19/2019   Type 2 diabetes mellitus (HCC) 01/19/2019  Hypothyroidism (acquired) 01/19/2019   Neck pain 01/19/2019   Dystonia 01/19/2019   Adhesive capsulitis of left shoulder 12/22/2018    PCP: [name], PA-C   REFERRING PROVIDER: Zola Button, Grayling Congress, *   REFERRING DIAG:  M25.50 (ICD-10-CM) - Arthralgia, unspecified joint  Z91.81 (ICD-10-CM) - At risk for fall due to comorbid condition    THERAPY DIAG:  Unsteadiness on  feet  Muscle weakness (generalized)  Chronic pain of both shoulders  Rationale for Evaluation and Treatment: Rehabilitation  ONSET DATE: Hospitalization on 06/11/2023  SUBJECTIVE:   SUBJECTIVE STATEMENT: Patient started having some memory difficulty.  She was brought here to ED and they said she had a stroke.  She is feeling better every day.  But now she can't move, can't dress, can't lift arms.  Before the stroke she didn't use a walker either.  She used a cane but feels more secure with the walker now.    From MD notes: Patient had history of chronic body pain which was managed with prednisone, but due to affect on her glaucoma, replaced by meloxicam by rheumatologist, but had to stop for ASA/plavis therapy for Afib, affecting her QOL.      PERTINENT HISTORY: AFIB, HTN, ischemic CVA, aortic atherosclerosis by CT imaging, cervical dystonia, chronic tremor, glaucoma, hypothyroidism, T2DM, and paroxysmal atrial fibrillation, Recent hospital admission 10/30-11/1 for CVA and underwent ILR placement on 06/13/23; arthritic complaints, specifically upper shoulders bilaterally   PAIN:  Are you having pain? Yes: NPRS scale: did not rate Pain location: both shoulders, wrists Pain description: ache, stiff  PRECAUTIONS: Fall  Implanted loop recorder.   RED FLAGS: None   WEIGHT BEARING RESTRICTIONS: No  FALLS:  Has patient fallen in last 6 months? No  LIVING ENVIRONMENT: Lives with: lives alone sons live close  Lives in: House/apartment Stairs: No Has following equipment at home: Single point cane and Environmental consultant - 2 wheeled  OCCUPATION: retired  PLOF: Independent with basic ADLs and Independent with household mobility with device  PATIENT GOALS: improve her mobility  NEXT MD VISIT: 09/16/23 with rheumatologist  OBJECTIVE:  Note: Objective measures were completed at Evaluation unless otherwise noted.  DIAGNOSTIC FINDINGS:  06/12/23 MR Brain IMPRESSION: 1. Small acute/subacute  infarcts in the left sub insular region and posterior left temporal lobe. 2. Remote infarcts in the left occipital lobe, thalami and bilateral cerebellar hemispheres. 3. Advanced chronic microvascular ischemic changes of the white matter. 4. At least moderate spinal canal stenosis at C3-4.  PATIENT SURVEYS:  ABC scale 650/1600 = 40.6%  COGNITION: Overall cognitive status: Impaired appears to have memory deficits, asked therapists name several times throughout session.      POSTURE: rounded shoulders and forward head  PALPATION: Noted decreased joint mobility in distal radial/ulnar, carpal joints bilaterally limiting wrist ROM  UPPER EXTREMITY MMT:  MMT Right eval Left eval  Shoulder flexion 3 3  Shoulder extension    Shoulder abduction 4 4  Shoulder adduction    Shoulder extension    Shoulder internal rotation 4 4  Shoulder external rotation    Elbow flexion 5 5  Elbow extension    Wrist flexion 5 5  Grip strength good good   (Blank rows = not tested)  UPPER EXTREMITY ROM:  Active ROM Right eval Left eval  Shoulder flexion 80a 120aa 75a 120aa  Shoulder abduction 70 70   (Blank rows = not tested)  LOWER EXTREMITY MMT:  MMT Right eval Left eval  Hip flexion 3 3  Hip extension  Hip abduction 3 3  Hip adduction 3 3  Knee flexion 4 4  Knee extension 5 5  Ankle dorsiflexion    Ankle plantarflexion     (Blank rows = not tested)  FUNCTIONAL TESTS:  5 times sit to stand: 46 sec MCTSIB: Condition 1: Avg of 3 trials: 30 sec, Condition 2: Avg of 3 trials: 30 sec, Condition 3: Avg of 3 trials: 9 sec, Condition 4: Avg of 3 trials: 0 sec, and Total Score: 69/120  GAIT: Distance walked: 50 Assistive device utilized: Walker - 2 wheeled Level of assistance: Modified independence Comments: Gait speed 0.5 m/s                                                                                                                                TREATMENT DATE:  09/09/23  EVAL Self Care: Findings, POC, initial HEP - sit to stands, table slides for arms into flexion.       PATIENT EDUCATION:  Education details: see self care Person educated: Patient Education method: Explanation, Demonstration, Verbal cues, and Handouts Education comprehension: verbalized understanding and returned demonstration  HOME EXERCISE PROGRAM: Access Code: [email or web address] URL: [email or web address] Date: 09/09/2023 Prepared by: Harrie Foreman  Exercises - Sit to Stand with Counter Support  - 1 x daily - 7 x weekly - 2 sets - 5 reps - Seated Bilateral Shoulder Flexion Towel Slide at Table Top  - 1 x daily - 7 x weekly - 1 sets - 10 reps  ASSESSMENT:  CLINICAL IMPRESSION: Chiquitta Matty  is a 89 y.o. female referred to O[facility]T for evaluation and treatment  for generalized arthralgia and increased risk of falls.  She reports memory impairments after CVA and frequent repeated self and asked therapists name throughout session today.  She also had difficulty stepping down off airex pad following mCTSIB test and had to be gently lifted and placed back into seated position by therapist.  She reports general difficulty with ADLs due to bil shoulder and wrist pain. Stephane Junkins  presents with physical impairments of impaired activity tolerance, impaired standing balance, impaired ambulation, and decreased safety awareness impacting safe and independent functional mobility. Examination revealed patient is at risk for falls and functional decline as evidenced by the following objective test measures: Gait speed 0.5 m/sec, (39m/sec is needed for community access), mCTSIB: position 1: 30 sec, position 2: 30 sec, position 3: 9sec, position 4: 0 sec (30sec in each position demonstrates equal weighting of balance systems), and 5x sit to stand of 46 sec (>15sec indicates increased risk for falls and decreased BLE power). Patient will benefit from skilled physical therapy services to help  reach the maximal level of functional independence and mobility. Patient demonstrates understanding of this plan of care and is in agreement with this plan.   Today Drena Ham was given initial HEP with sit to stands for LE strengthening and table slides to help with shoulder tightness.  OBJECTIVE IMPAIRMENTS: Abnormal gait, decreased activity tolerance, decreased balance, decreased endurance, decreased knowledge of condition, decreased mobility, difficulty walking, decreased ROM, decreased strength, decreased safety awareness, hypomobility, increased fascial restrictions, impaired perceived functional ability, increased muscle spasms, impaired flexibility, impaired UE functional use, and pain.   ACTIVITY LIMITATIONS: carrying, lifting, bending, standing, squatting, transfers, dressing, reach over head, hygiene/grooming, and locomotion level  PARTICIPATION LIMITATIONS: meal prep, cleaning, laundry, and community activity  PERSONAL FACTORS: Age, Fitness, Past/current experiences, Time since onset of injury/illness/exacerbation, and 3+ comorbidities: AFIB, HTN, ischemic CVA, aortic atherosclerosis by CT imaging, cervical dystonia, chronic tremor, glaucoma, hypothyroidism, T2DM, and paroxysmal atrial fibrillation, Recent hospital admission 10/30-11/1 for CVA and underwent ILR placement on 06/13/23; arthritic complaints, specifically upper shoulders bilaterally   are also affecting patient's functional outcome.   REHAB POTENTIAL: Good  CLINICAL DECISION MAKING: Evolving/moderate complexity  EVALUATION COMPLEXITY: Moderate   GOALS: Goals reviewed with patient? Yes  SHORT TERM GOALS: Target date: 09/24/2023   Patient will be independent with initial HEP. Baseline: given Goal status: INITIAL  2.  Patient will be educated on strategies to decrease risk of falls.  Baseline:  Goal status: INITIAL   LONG TERM GOALS: Target date: 11/19/2023   Patient will be independent with  advanced/ongoing HEP to improve outcomes and carryover.  Baseline:  Goal status: INITIAL  2.  Patient will be able to ambulate 600' with LRAD with good safety to access community.  Baseline:  Goal status: INITIAL  3. Patient will demonstrate improved functional LE strength as demonstrated by 5x STS < 30 seconds. Baseline: 46 seconds Goal status: INITIAL  4.  Patient will demonstrate > 19/28 on Tinetti to improve gait stability and reduce risk for falls. Baseline: NT Goal status: INITIAL  5.  Patient will report 50% improvement in bil shoulder mobility to improve ease of dressing and ADLs.  Baseline:  Goal status: INITIAL  6.  Patient will report 14 points improvement on ABC scale to demonstrate improved functional ability. Baseline: 40.6% confidence Goal status: INITIAL  7.  Patient will demonstrate gait speed of >0.55 m/s) to be a safe limited community ambulator with decreased risk for recurrent falls.  Baseline: 0.5 m/s Goal status: INITIAL   PLAN:  PT FREQUENCY: 1-2x/week  PT DURATION: 10 weeks  PLANNED INTERVENTIONS: 97110-Therapeutic exercises, 97530- Therapeutic activity, 97112- Neuromuscular re-education, 97535- Self Care, [id number]- Manual therapy, [phone]- Gait training, [phone]- Ultrasound, Patient/Family education, Balance training, Stair training, Taping, Dry Needling, Joint mobilization, Joint manipulation, Spinal manipulation, Spinal mobilization, Cryotherapy, and Moist heat  PLAN FOR NEXT SESSION: OTAGO exercise program, review fall prevention handout, gentle wrist, shoulder exercises as well.  Modalities PRN.   Has implanted loop recorder.    Jena Gauss, PT 09/10/2023, 10:17 AM

## 2023-09-09 ENCOUNTER — Ambulatory Visit: Payer: 59 | Admitting: Physical Therapy

## 2023-09-09 DIAGNOSIS — M6281 Muscle weakness (generalized): Secondary | ICD-10-CM | POA: Diagnosis not present

## 2023-09-09 DIAGNOSIS — R2681 Unsteadiness on feet: Secondary | ICD-10-CM

## 2023-09-09 DIAGNOSIS — M25511 Pain in right shoulder: Secondary | ICD-10-CM | POA: Diagnosis not present

## 2023-09-09 DIAGNOSIS — G8929 Other chronic pain: Secondary | ICD-10-CM

## 2023-09-09 DIAGNOSIS — M25512 Pain in left shoulder: Secondary | ICD-10-CM | POA: Diagnosis not present

## 2023-09-10 ENCOUNTER — Encounter: Payer: Self-pay | Admitting: Physical Therapy

## 2023-09-10 ENCOUNTER — Other Ambulatory Visit: Payer: Self-pay

## 2023-09-16 ENCOUNTER — Encounter: Payer: Self-pay | Admitting: Internal Medicine

## 2023-09-16 ENCOUNTER — Ambulatory Visit: Payer: 59 | Attending: Internal Medicine | Admitting: Internal Medicine

## 2023-09-16 ENCOUNTER — Ambulatory Visit (INDEPENDENT_AMBULATORY_CARE_PROVIDER_SITE_OTHER): Payer: Medicare HMO

## 2023-09-16 VITALS — BP 124/64 | HR 69 | Resp 14 | Ht <= 58 in | Wt 117.0 lb

## 2023-09-16 DIAGNOSIS — G8929 Other chronic pain: Secondary | ICD-10-CM | POA: Diagnosis not present

## 2023-09-16 DIAGNOSIS — M25512 Pain in left shoulder: Secondary | ICD-10-CM | POA: Diagnosis not present

## 2023-09-16 DIAGNOSIS — I639 Cerebral infarction, unspecified: Secondary | ICD-10-CM | POA: Diagnosis not present

## 2023-09-16 DIAGNOSIS — M1990 Unspecified osteoarthritis, unspecified site: Secondary | ICD-10-CM

## 2023-09-16 DIAGNOSIS — R350 Frequency of micturition: Secondary | ICD-10-CM | POA: Diagnosis not present

## 2023-09-16 DIAGNOSIS — M1711 Unilateral primary osteoarthritis, right knee: Secondary | ICD-10-CM

## 2023-09-16 DIAGNOSIS — M25511 Pain in right shoulder: Secondary | ICD-10-CM | POA: Diagnosis not present

## 2023-09-17 ENCOUNTER — Other Ambulatory Visit (HOSPITAL_COMMUNITY): Payer: Self-pay | Admitting: Internal Medicine

## 2023-09-17 ENCOUNTER — Ambulatory Visit: Payer: 59

## 2023-09-17 LAB — CUP PACEART REMOTE DEVICE CHECK
Date Time Interrogation Session: 20250204095714
Implantable Pulse Generator Implant Date: 20241101

## 2023-09-19 ENCOUNTER — Other Ambulatory Visit: Payer: Self-pay | Admitting: Medical

## 2023-09-22 ENCOUNTER — Ambulatory Visit: Payer: 59 | Attending: Family Medicine | Admitting: Physical Therapy

## 2023-09-22 ENCOUNTER — Encounter: Payer: Self-pay | Admitting: Physical Therapy

## 2023-09-22 DIAGNOSIS — M25511 Pain in right shoulder: Secondary | ICD-10-CM | POA: Insufficient documentation

## 2023-09-22 DIAGNOSIS — M6281 Muscle weakness (generalized): Secondary | ICD-10-CM | POA: Diagnosis not present

## 2023-09-22 DIAGNOSIS — R2681 Unsteadiness on feet: Secondary | ICD-10-CM | POA: Insufficient documentation

## 2023-09-22 DIAGNOSIS — M25512 Pain in left shoulder: Secondary | ICD-10-CM | POA: Diagnosis not present

## 2023-09-22 DIAGNOSIS — G8929 Other chronic pain: Secondary | ICD-10-CM | POA: Diagnosis not present

## 2023-09-22 NOTE — Therapy (Signed)
 OUTPATIENT PHYSICAL THERAPY LOWER EXTREMITY EVALUATION   Patient Name: Bhawna Burdine MRN: [id number] DOB:[birth year], 89 y.o., female Today's Date: 09/22/2023  END OF SESSION:  PT End of Session - 09/22/23 1107     Visit Number 2    Date for PT Re-Evaluation 11/19/23    Authorization Type [employer] & Medicaid    Progress Note Due on Visit 10    PT Start Time 1103    PT Stop Time 1145    PT Time Calculation (min) 42 min    Activity Tolerance Patient tolerated treatment well    Behavior During Therapy [facility] for tasks assessed/performed             Past Medical History:  Diagnosis Date   Adhesive capsulitis of left shoulder 12/22/2018   Bilateral shoulder pain 08/24/2021   Cerebrovascular accident (CVA) (HCC) 10/25/2021   Cervical dystonia 03/08/2019   [employer]  approved diagnosis.   COVID 05/29/2021   Dystonia    Edema 02/11/2019   Ganglion cyst 08/25/2020   Glaucoma    Hyperlipidemia 02/11/2019   Hypertension    Hypothyroidism (acquired) 01/19/2019   Inflammatory arthritis 08/24/2021   Mixed incontinence 03/15/2020   Formatting of this note might be different from the original.  Added automatically from request for surgery [id number]   Neck pain 01/19/2019   Post-COVID chronic fatigue 05/29/2021   Post-COVID chronic joint pain 05/29/2021   Post-COVID chronic muscle pain 05/29/2021   Primary open angle glaucoma (POAG) of both eyes, moderate stage 09/12/2021   Sedimentation rate elevation 08/24/2021   Stroke (HCC) 10/2021   Stroke (HCC) 05/2023   Syncope 10/09/2021   Thyroid  disease    Type 2 diabetes mellitus (HCC) 01/19/2019   Urgency incontinence 03/15/2020   Formatting of this note might be different from the original.  Added automatically from request for surgery [id number]   Urinary frequency 08/24/2021   Vertigo 12/01/2019   Past Surgical History:  Procedure Laterality Date   LOOP RECORDER INSERTION N/A 06/13/2023   Procedure: LOOP RECORDER INSERTION;   Surgeon: Lei Pump, MD;  Location: [facility];  Service: Cardiovascular;  Laterality: N/A;   NO PAST SURGERIES     Patient Active Problem List   Diagnosis Date Noted   Paroxysmal atrial fibrillation (HCC) 07/18/2023   Hypercoagulable state due to paroxysmal atrial fibrillation (HCC) 07/18/2023   Acute encephalopathy 06/11/2023   Osteoarthritis of right knee 12/18/2022   Glaucoma 11/26/2021   Cerebrovascular accident (CVA) (HCC) 10/25/2021   History of stroke 10/2021   Syncope 10/09/2021   Primary open angle glaucoma (POAG) of both eyes, moderate stage 09/12/2021   Sedimentation rate elevation 08/24/2021   Bilateral shoulder pain 08/24/2021   Inflammatory arthritis 08/24/2021   Urinary frequency 08/24/2021   COVID 05/29/2021   Post-COVID chronic fatigue 05/29/2021   Post-COVID chronic joint pain 05/29/2021   Post-COVID chronic muscle pain 05/29/2021   BPPV (benign paroxysmal positional vertigo), right 01/03/2021   Thyroid  disease    Ganglion cyst 08/25/2020   Mixed incontinence 03/15/2020   Urgency incontinence 03/15/2020   Blepharitis of upper and lower eyelids of both eyes 01/07/2020   Dermatochalasis of both upper eyelids 01/07/2020   Hypertensive retinopathy of both eyes 01/07/2020   Macular RPE mottling 01/07/2020   Meibomian gland dysfunction (MGD) of both eyes 01/07/2020   Vitreous syneresis of both eyes 01/07/2020   Vertigo 12/01/2019   Cervical dystonia 03/08/2019   Hyperlipidemia 02/11/2019   Edema 02/11/2019   Hypertension 01/19/2019   Type  2 diabetes mellitus (HCC) 01/19/2019   Hypothyroidism (acquired) 01/19/2019   Neck pain 01/19/2019   Dystonia 01/19/2019   Adhesive capsulitis of left shoulder 12/22/2018    PCP: Sylvia Everts, PA-C   REFERRING PROVIDER: Crecencio Dodge, Candida Chalk, *   REFERRING DIAG:  M25.50 (ICD-10-CM) - Arthralgia, unspecified joint  Z91.81 (ICD-10-CM) - At risk for fall due to comorbid condition    THERAPY DIAG:   Unsteadiness on feet  Muscle weakness (generalized)  Chronic pain of both shoulders  Rationale for Evaluation and Treatment: Rehabilitation  ONSET DATE: Hospitalization on 06/11/2023  SUBJECTIVE:   SUBJECTIVE STATEMENT: 09/22/2023 patient reports she had injections in both shoulders a few days ago (on 09/16/23).   Weather is bothering her.   From MD notes: Patient had history of chronic body pain which was managed with prednisone , but due to affect on her glaucoma, replaced by meloxicam  by rheumatologist, but had to stop for ASA/plavis therapy for Afib, affecting her QOL.      PERTINENT HISTORY: AFIB, HTN, ischemic CVA, aortic atherosclerosis by CT imaging, cervical dystonia, chronic tremor, glaucoma, hypothyroidism, T2DM, and paroxysmal atrial fibrillation, Recent hospital admission 10/30-11/1 for CVA and underwent ILR placement on 06/13/23; arthritic complaints, specifically upper shoulders bilaterally   PAIN:  Are you having pain? Yes: NPRS scale: 3/10 Pain location: both shoulders, wrists Pain description: ache, stiff  PRECAUTIONS: Fall  Implanted loop recorder.   RED FLAGS: None   WEIGHT BEARING RESTRICTIONS: No  FALLS:  Has patient fallen in last 6 months? No  LIVING ENVIRONMENT: Lives with: lives alone sons live close  Lives in: House/apartment Stairs: No Has following equipment at home: Single point cane and Environmental consultant - 2 wheeled  OCCUPATION: retired  PLOF: Independent with basic ADLs and Independent with household mobility with device  PATIENT GOALS: improve her mobility  NEXT MD VISIT: 09/16/23 with rheumatologist  OBJECTIVE:  Note: Objective measures were completed at Evaluation unless otherwise noted.  DIAGNOSTIC FINDINGS:  06/12/23 MR Brain IMPRESSION: 1. Small acute/subacute infarcts in the left sub insular region and posterior left temporal lobe. 2. Remote infarcts in the left occipital lobe, thalami and bilateral cerebellar hemispheres. 3. Advanced  chronic microvascular ischemic changes of the white matter. 4. At least moderate spinal canal stenosis at C3-4.  PATIENT SURVEYS:  ABC scale 650/1600 = 40.6%  COGNITION: Overall cognitive status: Impaired appears to have memory deficits, asked therapists name several times throughout session.      POSTURE: rounded shoulders and forward head  PALPATION: Noted decreased joint mobility in distal radial/ulnar, carpal joints bilaterally limiting wrist ROM  UPPER EXTREMITY MMT:  MMT Right eval Left eval  Shoulder flexion 3 3  Shoulder extension    Shoulder abduction 4 4  Shoulder adduction    Shoulder extension    Shoulder internal rotation 4 4  Shoulder external rotation    Elbow flexion 5 5  Elbow extension    Wrist flexion 5 5  Grip strength good good   (Blank rows = not tested)  UPPER EXTREMITY ROM:  Active ROM Right eval Left eval  Shoulder flexion 80a 120aa 75a 120aa  Shoulder abduction 70 70   (Blank rows = not tested)  LOWER EXTREMITY MMT:  MMT Right eval Left eval  Hip flexion 3 3  Hip extension    Hip abduction 3 3  Hip adduction 3 3  Knee flexion 4 4  Knee extension 5 5  Ankle dorsiflexion    Ankle plantarflexion     (Blank rows =  not tested)  FUNCTIONAL TESTS:  5 times sit to stand: 46 sec MCTSIB: Condition 1: Avg of 3 trials: 30 sec, Condition 2: Avg of 3 trials: 30 sec, Condition 3: Avg of 3 trials: 9 sec, Condition 4: Avg of 3 trials: 0 sec, and Total Score: 69/120  GAIT: Distance walked: 50 Assistive device utilized: Walker - 2 wheeled Level of assistance: Modified independence Comments: Gait speed 0.5 m/s                                                                                                                                TREATMENT DATE:  09/22/23 Therapeutic Exercise: to improve strength and mobility.  Demo, verbal and tactile cues throughout for technique. Nustep L3 x 8 min Seated table slides flexion bil x 2 min  Seated  table slides scaption r/l x 2 min each Shoulder rolls retro. At counter with bil UE support and CGA for safety: Standing marching x 20 Standing heel raises x 20 Standing toe raises x 10 Standing hip abduction x 15 r/l Manual Therapy: to decrease muscle spasm and pain and improve mobility STM/TPR to bil UT, cervical paraspinals, pectoralis, gentle scapular mobs.   09/09/23 EVAL Self Care: Findings, POC, initial HEP - sit to stands, table slides for arms into flexion.       PATIENT EDUCATION:  Education details: HEP update Person educated: Patient Education method: Explanation, Demonstration, Verbal cues, and Handouts Education comprehension: verbalized understanding and returned demonstration  HOME EXERCISE PROGRAM: Access Code: [email or web address] URL: [email or web address] Date: 09/22/2023 Prepared by: Randa Burton  Exercises - Sit to Stand with Counter Support  - 1 x daily - 7 x weekly - 2 sets - 5 reps - Seated Bilateral Shoulder Flexion Towel Slide at Table Top  - 1 x daily - 7 x weekly - 2 sets - 10 reps - Seated Shoulder Scaption Slide at Table Top with Forearm in Neutral  - 1 x daily - 7 x weekly - 2 sets - 10 reps - Heel Raises with Counter Support  - 1 x daily - 7 x weekly - 2 sets - 10 reps - Standing Hip Abduction with Counter Support  - 1 x daily - 7 x weekly - 2 sets - 10 reps - Standing March with Counter Support  - 1 x daily - 7 x weekly - 2 sets - 10 reps  Patient Education - How to Prevent Falls - Spanish  ASSESSMENT:  CLINICAL IMPRESSION: Elton Sossamon  is a 89 y.o. female referred to OPPT for evaluation and treatment  for generalized arthralgia and increased risk of falls.  She reports improved shoulder pain after receiving cortisone injections in bil shoulders.  Today focused on progressing HEP, again starting with table slides which she tolerated well, and generalized LE strengthening at counter for safety.  Fatigued quickly.  Manual therapy to  shoulders, noted very little movement in scapula, but reported decreased pain following.  Suanne Else  Platten continues to demonstrate potential for improvement and would benefit from continued skilled therapy to address impairments.      OBJECTIVE IMPAIRMENTS: Abnormal gait, decreased activity tolerance, decreased balance, decreased endurance, decreased knowledge of condition, decreased mobility, difficulty walking, decreased ROM, decreased strength, decreased safety awareness, hypomobility, increased fascial restrictions, impaired perceived functional ability, increased muscle spasms, impaired flexibility, impaired UE functional use, and pain.   ACTIVITY LIMITATIONS: carrying, lifting, bending, standing, squatting, transfers, dressing, reach over head, hygiene/grooming, and locomotion level  PARTICIPATION LIMITATIONS: meal prep, cleaning, laundry, and community activity  PERSONAL FACTORS: Age, Fitness, Past/current experiences, Time since onset of injury/illness/exacerbation, and 3+ comorbidities: AFIB, HTN, ischemic CVA, aortic atherosclerosis by CT imaging, cervical dystonia, chronic tremor, glaucoma, hypothyroidism, T2DM, and paroxysmal atrial fibrillation, Recent hospital admission 10/30-11/1 for CVA and underwent ILR placement on 06/13/23; arthritic complaints, specifically upper shoulders bilaterally   are also affecting patient's functional outcome.   REHAB POTENTIAL: Good  CLINICAL DECISION MAKING: Evolving/moderate complexity  EVALUATION COMPLEXITY: Moderate   GOALS: Goals reviewed with patient? Yes  SHORT TERM GOALS: Target date: 09/24/2023   Patient will be independent with initial HEP. Baseline: given Goal status: INITIAL  2.  Patient will be educated on strategies to decrease risk of falls.  Baseline:  Goal status: INITIAL   LONG TERM GOALS: Target date: 11/19/2023   Patient will be independent with advanced/ongoing HEP to improve outcomes and carryover.  Baseline:  Goal  status: INITIAL  2.  Patient will be able to ambulate 600' with LRAD with good safety to access community.  Baseline:  Goal status: INITIAL  3. Patient will demonstrate improved functional LE strength as demonstrated by 5x STS < 30 seconds. Baseline: 46 seconds Goal status: INITIAL  4.  Patient will demonstrate > 19/28 on Tinetti to improve gait stability and reduce risk for falls. Baseline: NT Goal status: INITIAL  5.  Patient will report 50% improvement in bil shoulder mobility to improve ease of dressing and ADLs.  Baseline:  Goal status: INITIAL  6.  Patient will report 14 points improvement on ABC scale to demonstrate improved functional ability. Baseline: 40.6% confidence Goal status: INITIAL  7.  Patient will demonstrate gait speed of >0.55 m/s) to be a safe limited community ambulator with decreased risk for recurrent falls.  Baseline: 0.5 m/s Goal status: INITIAL   PLAN:  PT FREQUENCY: 1-2x/week  PT DURATION: 10 weeks  PLANNED INTERVENTIONS: 97110-Therapeutic exercises, 97530- Therapeutic activity, 97112- Neuromuscular re-education, 97535- Self Care, 16109- Manual therapy, [phone]- Gait training, [phone]- Ultrasound, Patient/Family education, Balance training, Stair training, Taping, Dry Needling, Joint mobilization, Joint manipulation, Spinal manipulation, Spinal mobilization, Cryotherapy, and Moist heat  PLAN FOR NEXT SESSION:  postural strengthening, focus on OTAGOs exercises - doing medbridge so exercise instructions in spanish, review fall prevention handout, Modalities PRN.   Has implanted loop recorder.    Donell Fuller, PT 09/22/2023, 12:13 PM

## 2023-09-23 NOTE — Progress Notes (Signed)
[facility] Summary Report / Loop Recorder

## 2023-09-23 NOTE — Addendum Note (Signed)
Addended by: Geralyn Flash D on: 09/23/2023 11:00 AM   Modules accepted: Orders

## 2023-09-24 ENCOUNTER — Other Ambulatory Visit: Payer: Self-pay | Admitting: Medical

## 2023-09-24 MED ORDER — LIDOCAINE HCL 1 % IJ SOLN
1.5000 mL | INTRAMUSCULAR | Status: AC | PRN
  Administered 2023-09-16: 1.5 mL

## 2023-09-24 MED ORDER — TRIAMCINOLONE ACETONIDE 40 MG/ML IJ SUSP
40.0000 mg | INTRAMUSCULAR | Status: AC | PRN
Start: 2023-09-16 — End: 2023-09-16
  Administered 2023-09-16: 40 mg via INTRA_ARTICULAR

## 2023-09-24 MED ORDER — TRIAMCINOLONE ACETONIDE 40 MG/ML IJ SUSP
40.0000 mg | INTRAMUSCULAR | Status: AC | PRN
  Administered 2023-09-16: 40 mg via INTRA_ARTICULAR

## 2023-09-25 ENCOUNTER — Encounter: Payer: Self-pay | Admitting: Physical Therapy

## 2023-09-25 ENCOUNTER — Ambulatory Visit: Payer: 59 | Admitting: Physical Therapy

## 2023-09-25 DIAGNOSIS — M25511 Pain in right shoulder: Secondary | ICD-10-CM | POA: Diagnosis not present

## 2023-09-25 DIAGNOSIS — R2681 Unsteadiness on feet: Secondary | ICD-10-CM | POA: Diagnosis not present

## 2023-09-25 DIAGNOSIS — G8929 Other chronic pain: Secondary | ICD-10-CM

## 2023-09-25 DIAGNOSIS — M25512 Pain in left shoulder: Secondary | ICD-10-CM | POA: Diagnosis not present

## 2023-09-25 DIAGNOSIS — M6281 Muscle weakness (generalized): Secondary | ICD-10-CM

## 2023-09-25 NOTE — Therapy (Signed)
OUTPATIENT PHYSICAL THERAPY LOWER EXTREMITY EVALUATION   Patient Name: Leslie Ward MRN: [id number] DOB:[birth year], 89 y.o., female Today's Date: 09/25/2023  END OF SESSION:  PT End of Session - 09/25/23 1403     Visit Number 3    Date for PT Re-Evaluation 11/19/23    Authorization Type [employer] & Medicaid    Progress Note Due on Visit 10    PT Start Time 1316    PT Stop Time 1400    PT Time Calculation (min) 44 min    Activity Tolerance Patient tolerated treatment well    Behavior During Therapy [facility] for tasks assessed/performed              Past Medical History:  Diagnosis Date   Adhesive capsulitis of left shoulder 12/22/2018   Bilateral shoulder pain 08/24/2021   Cerebrovascular accident (CVA) (HCC) 10/25/2021   Cervical dystonia 03/08/2019   [employer] approved diagnosis.   COVID 05/29/2021   Dystonia    Edema 02/11/2019   Ganglion cyst 08/25/2020   Glaucoma    Hyperlipidemia 02/11/2019   Hypertension    Hypothyroidism (acquired) 01/19/2019   Inflammatory arthritis 08/24/2021   Mixed incontinence 03/15/2020   Formatting of this note might be different from the original.  Added automatically from request for surgery [id number]   Neck pain 01/19/2019   Post-COVID chronic fatigue 05/29/2021   Post-COVID chronic joint pain 05/29/2021   Post-COVID chronic muscle pain 05/29/2021   Primary open angle glaucoma (POAG) of both eyes, moderate stage 09/12/2021   Sedimentation rate elevation 08/24/2021   Stroke (HCC) 10/2021   Stroke (HCC) 05/2023   Syncope 10/09/2021   Thyroid disease    Type 2 diabetes mellitus (HCC) 01/19/2019   Urgency incontinence 03/15/2020   Formatting of this note might be different from the original.  Added automatically from request for surgery [id number]   Urinary frequency 08/24/2021   Vertigo 12/01/2019   Past Surgical History:  Procedure Laterality Date   LOOP RECORDER INSERTION N/A 06/13/2023   Procedure: LOOP RECORDER INSERTION;   Surgeon: Regan Lemming, MD;  Location: [facility];  Service: Cardiovascular;  Laterality: N/A;   NO PAST SURGERIES     Patient Active Problem List   Diagnosis Date Noted   Paroxysmal atrial fibrillation (HCC) 07/18/2023   Hypercoagulable state due to paroxysmal atrial fibrillation (HCC) 07/18/2023   Acute encephalopathy 06/11/2023   Osteoarthritis of right knee 12/18/2022   Glaucoma 11/26/2021   Cerebrovascular accident (CVA) (HCC) 10/25/2021   History of stroke 10/2021   Syncope 10/09/2021   Primary open angle glaucoma (POAG) of both eyes, moderate stage 09/12/2021   Sedimentation rate elevation 08/24/2021   Bilateral shoulder pain 08/24/2021   Inflammatory arthritis 08/24/2021   Urinary frequency 08/24/2021   COVID 05/29/2021   Post-COVID chronic fatigue 05/29/2021   Post-COVID chronic joint pain 05/29/2021   Post-COVID chronic muscle pain 05/29/2021   BPPV (benign paroxysmal positional vertigo), right 01/03/2021   Thyroid disease    Ganglion cyst 08/25/2020   Mixed incontinence 03/15/2020   Urgency incontinence 03/15/2020   Blepharitis of upper and lower eyelids of both eyes 01/07/2020   Dermatochalasis of both upper eyelids 01/07/2020   Hypertensive retinopathy of both eyes 01/07/2020   Macular RPE mottling 01/07/2020   Meibomian gland dysfunction (MGD) of both eyes 01/07/2020   Vitreous syneresis of both eyes 01/07/2020   Vertigo 12/01/2019   Cervical dystonia 03/08/2019   Hyperlipidemia 02/11/2019   Edema 02/11/2019   Hypertension 01/19/2019  Type 2 diabetes mellitus (HCC) 01/19/2019   Hypothyroidism (acquired) 01/19/2019   Neck pain 01/19/2019   Dystonia 01/19/2019   Adhesive capsulitis of left shoulder 12/22/2018    PCP: [name], PA-C   REFERRING PROVIDER: Zola Button, Grayling Congress, *   REFERRING DIAG:  M25.50 (ICD-10-CM) - Arthralgia, unspecified joint  Z91.81 (ICD-10-CM) - At risk for fall due to comorbid condition    THERAPY DIAG:   Unsteadiness on feet  Muscle weakness (generalized)  Chronic pain of both shoulders  Rationale for Evaluation and Treatment: Rehabilitation  ONSET DATE: Hospitalization on 06/11/2023  SUBJECTIVE:   SUBJECTIVE STATEMENT: 09/25/2023 her shoulders still feel very good. L wrist hurts 8/10   From MD notes: Patient had history of chronic body pain which was managed with prednisone, but due to affect on her glaucoma, replaced by meloxicam by rheumatologist, but had to stop for ASA/plavis therapy for Afib, affecting her QOL.      PERTINENT HISTORY: AFIB, HTN, ischemic CVA, aortic atherosclerosis by CT imaging, cervical dystonia, chronic tremor, glaucoma, hypothyroidism, T2DM, and paroxysmal atrial fibrillation, Recent hospital admission 10/30-11/1 for CVA and underwent ILR placement on 06/13/23; arthritic complaints, specifically upper shoulders bilaterally   PAIN:  Are you having pain? Yes: NPRS scale: 1/10 Pain location: both shoulders, 8/10 L wrist Pain description: ache, stiff  PRECAUTIONS: Fall  Implanted loop recorder.   RED FLAGS: None   WEIGHT BEARING RESTRICTIONS: No  FALLS:  Has patient fallen in last 6 months? No  LIVING ENVIRONMENT: Lives with: lives alone sons live close  Lives in: House/apartment Stairs: No Has following equipment at home: Single point cane and Environmental consultant - 2 wheeled  OCCUPATION: retired  PLOF: Independent with basic ADLs and Independent with household mobility with device  PATIENT GOALS: improve her mobility  NEXT MD VISIT: 09/16/23 with rheumatologist  OBJECTIVE:  Note: Objective measures were completed at Evaluation unless otherwise noted.  DIAGNOSTIC FINDINGS:  06/12/23 MR Brain IMPRESSION: 1. Small acute/subacute infarcts in the left sub insular region and posterior left temporal lobe. 2. Remote infarcts in the left occipital lobe, thalami and bilateral cerebellar hemispheres. 3. Advanced chronic microvascular ischemic changes of the  white matter. 4. At least moderate spinal canal stenosis at C3-4.  PATIENT SURVEYS:  ABC scale 650/1600 = 40.6%  COGNITION: Overall cognitive status: Impaired appears to have memory deficits, asked therapists name several times throughout session.      POSTURE: rounded shoulders and forward head  PALPATION: Noted decreased joint mobility in distal radial/ulnar, carpal joints bilaterally limiting wrist ROM  UPPER EXTREMITY MMT:  MMT Right eval Left eval  Shoulder flexion 3 3  Shoulder extension    Shoulder abduction 4 4  Shoulder adduction    Shoulder extension    Shoulder internal rotation 4 4  Shoulder external rotation    Elbow flexion 5 5  Elbow extension    Wrist flexion 5 5  Grip strength good good   (Blank rows = not tested)  UPPER EXTREMITY ROM:  Active ROM Right eval Left eval  Shoulder flexion 80a 120aa 75a 120aa  Shoulder abduction 70 70   (Blank rows = not tested)  LOWER EXTREMITY MMT:  MMT Right eval Left eval  Hip flexion 3 3  Hip extension    Hip abduction 3 3  Hip adduction 3 3  Knee flexion 4 4  Knee extension 5 5  Ankle dorsiflexion    Ankle plantarflexion     (Blank rows = not tested)  FUNCTIONAL TESTS:  5 times sit to stand: 46 sec MCTSIB: Condition 1: Avg of 3 trials: 30 sec, Condition 2: Avg of 3 trials: 30 sec, Condition 3: Avg of 3 trials: 9 sec, Condition 4: Avg of 3 trials: 0 sec, and Total Score: 69/120  GAIT: Distance walked: 50 Assistive device utilized: Walker - 2 wheeled Level of assistance: Modified independence Comments: Gait speed 0.5 m/s                                                                                                                                TREATMENT DATE:  09/25/23 Therapeutic Exercise: to improve strength and mobility.  Demo, verbal and tactile cues throughout for technique. Pulleys x 3 min  Sit to stands x 10  Seated LAQ with 2# weights x 10 r/L Seated marches 2# x 20 Gentle wrist  stretches and circles Standing marching 2# x 30 Standing heel raises x 10 2# Manual Therapy: to decrease muscle spasm and pain and improve mobility Gentle mobs to bil wrists.  Mobility in L wrist extremely limited.   09/22/23 Therapeutic Exercise: to improve strength and mobility.  Demo, verbal and tactile cues throughout for technique. Nustep L3 x 8 min Seated table slides flexion bil x 2 min  Seated table slides scaption r/l x 2 min each Shoulder rolls retro. At counter with bil UE support and CGA for safety: Standing marching x 20 Standing heel raises x 20 Standing toe raises x 10 Standing hip abduction x 15 r/l Manual Therapy: to decrease muscle spasm and pain and improve mobility STM/TPR to bil UT, cervical paraspinals, pectoralis, gentle scapular mobs.   09/09/23 EVAL Self Care: Findings, POC, initial HEP - sit to stands, table slides for arms into flexion.       PATIENT EDUCATION:  Education details: information provided on adjustable ankle weights and arm pulleys Person educated: Patient Education method: Explanation and Handouts Education comprehension: verbalized understanding  HOME EXERCISE PROGRAM: Access Code: [email or web address] URL: [email or web address] Date: 09/22/2023 Prepared by: Harrie Foreman  Exercises - Sit to Stand with Counter Support  - 1 x daily - 7 x weekly - 2 sets - 5 reps - Seated Bilateral Shoulder Flexion Towel Slide at Table Top  - 1 x daily - 7 x weekly - 2 sets - 10 reps - Seated Shoulder Scaption Slide at Table Top with Forearm in Neutral  - 1 x daily - 7 x weekly - 2 sets - 10 reps - Heel Raises with Counter Support  - 1 x daily - 7 x weekly - 2 sets - 10 reps - Standing Hip Abduction with Counter Support  - 1 x daily - 7 x weekly - 2 sets - 10 reps - Standing March with Counter Support  - 1 x daily - 7 x weekly - 2 sets - 10 reps  Patient Education - How to Prevent Falls - Spanish  ASSESSMENT:  CLINICAL IMPRESSION: Leslie Ward  is  a 89 y.o. female referred to OPPT for evaluation and treatment  for generalized arthralgia and increased risk of falls.  She continues to report improved pain in bil shoulders, she really enjoyed the pulleys today, provided information on purchase (either on [place] or downstairs pharmacy) for son.  She still fatigued very quickly with exercises, added resistance today.  Noted she had very little radial/ulnar movement in L wrist were she reports significant pain, but she reported decreased pain after interventions.  Leslie Ward continues to demonstrate potential for improvement and would benefit from continued skilled therapy to address impairments.      OBJECTIVE IMPAIRMENTS: Abnormal gait, decreased activity tolerance, decreased balance, decreased endurance, decreased knowledge of condition, decreased mobility, difficulty walking, decreased ROM, decreased strength, decreased safety awareness, hypomobility, increased fascial restrictions, impaired perceived functional ability, increased muscle spasms, impaired flexibility, impaired UE functional use, and pain.   ACTIVITY LIMITATIONS: carrying, lifting, bending, standing, squatting, transfers, dressing, reach over head, hygiene/grooming, and locomotion level  PARTICIPATION LIMITATIONS: meal prep, cleaning, laundry, and community activity  PERSONAL FACTORS: Age, Fitness, Past/current experiences, Time since onset of injury/illness/exacerbation, and 3+ comorbidities: AFIB, HTN, ischemic CVA, aortic atherosclerosis by CT imaging, cervical dystonia, chronic tremor, glaucoma, hypothyroidism, T2DM, and paroxysmal atrial fibrillation, Recent hospital admission 10/30-11/1 for CVA and underwent ILR placement on 06/13/23; arthritic complaints, specifically upper shoulders bilaterally   are also affecting patient's functional outcome.   REHAB POTENTIAL: Good  CLINICAL DECISION MAKING: Evolving/moderate complexity  EVALUATION COMPLEXITY:  Moderate   GOALS: Goals reviewed with patient? Yes  SHORT TERM GOALS: Target date: 09/24/2023   Patient will be independent with initial HEP. Baseline: given Goal status: IN PROGRESS 09/25/23 reports compliance with table slides and sit to stands, does not remember updates.   2.  Patient will be educated on strategies to decrease risk of falls.  Baseline:  Goal status: MET 09/23/23 Handout provided.    LONG TERM GOALS: Target date: 11/19/2023   Patient will be independent with advanced/ongoing HEP to improve outcomes and carryover.  Baseline:  Goal status: IN PROGRESS  2.  Patient will be able to ambulate 600' with LRAD with good safety to access community.  Baseline:  Goal status: IN PROGRESS  3. Patient will demonstrate improved functional LE strength as demonstrated by 5x STS < 30 seconds. Baseline: 46 seconds Goal status: IN PROGRESS  4.  Patient will demonstrate > 19/28 on [place] to improve gait stability and reduce risk for falls. Baseline: NT Goal status: IN PROGRESS  5.  Patient will report 50% improvement in bil shoulder mobility to improve ease of dressing and ADLs.  Baseline:  Goal status: IN PROGRESS  6.  Patient will report 14 points improvement on ABC scale to demonstrate improved functional ability. Baseline: 40.6% confidence Goal status: IN PROGRESS  7.  Patient will demonstrate gait speed of >0.55 m/s) to be a safe limited community ambulator with decreased risk for recurrent falls.  Baseline: 0.5 m/s Goal status: IN PROGRESS   PLAN:  PT FREQUENCY: 1-2x/week  PT DURATION: 10 weeks  PLANNED INTERVENTIONS: 97110-Therapeutic exercises, 97530- Therapeutic activity, 97112- Neuromuscular re-education, 97535- Self Care, 40981- Manual therapy, [phone]- Gait training, [phone]- Ultrasound, Patient/Family education, Balance training, Stair training, Taping, Dry Needling, Joint mobilization, Joint manipulation, Spinal manipulation, Spinal mobilization, Cryotherapy,  and Moist heat  PLAN FOR NEXT SESSION:  postural strengthening, focus on OTAGOs exercises - doing medbridge so exercise instructions in spanish, review fall prevention handout, Modalities PRN.   Has implanted loop recorder.  Jena Gauss, PT 09/25/2023, 3:14 PM

## 2023-09-29 ENCOUNTER — Ambulatory Visit: Payer: 59 | Admitting: Physical Therapy

## 2023-09-29 ENCOUNTER — Encounter: Payer: Self-pay | Admitting: Physical Therapy

## 2023-09-29 DIAGNOSIS — R2681 Unsteadiness on feet: Secondary | ICD-10-CM | POA: Diagnosis not present

## 2023-09-29 DIAGNOSIS — G8929 Other chronic pain: Secondary | ICD-10-CM

## 2023-09-29 DIAGNOSIS — M6281 Muscle weakness (generalized): Secondary | ICD-10-CM

## 2023-09-29 DIAGNOSIS — M25511 Pain in right shoulder: Secondary | ICD-10-CM | POA: Diagnosis not present

## 2023-09-29 DIAGNOSIS — M25512 Pain in left shoulder: Secondary | ICD-10-CM | POA: Diagnosis not present

## 2023-09-29 NOTE — Therapy (Signed)
OUTPATIENT PHYSICAL THERAPY LOWER EXTREMITY EVALUATION   Patient Name: Leslie Ward MRN: [id number] DOB:06-24-1931, 89 y.o., female Today's Date: 09/29/2023  END OF SESSION:  PT End of Session - 09/29/23 1110     Visit Number 4    Date for PT Re-Evaluation 11/19/23    Authorization Type [employer] & Medicaid    Progress Note Due on Visit 10    PT Start Time 1103    PT Stop Time 1145    PT Time Calculation (min) 42 min    Activity Tolerance Patient tolerated treatment well    Behavior During Therapy [facility] for tasks assessed/performed              Past Medical History:  Diagnosis Date   Adhesive capsulitis of left shoulder 12/22/2018   Bilateral shoulder pain 08/24/2021   Cerebrovascular accident (CVA) (HCC) 10/25/2021   Cervical dystonia 03/08/2019   [employer] approved diagnosis.   COVID 05/29/2021   Dystonia    Edema 02/11/2019   Ganglion cyst 08/25/2020   Glaucoma    Hyperlipidemia 02/11/2019   Hypertension    Hypothyroidism (acquired) 01/19/2019   Inflammatory arthritis 08/24/2021   Mixed incontinence 03/15/2020   Formatting of this note might be different from the original.  Added automatically from request for surgery [id number]   Neck pain 01/19/2019   Post-COVID chronic fatigue 05/29/2021   Post-COVID chronic joint pain 05/29/2021   Post-COVID chronic muscle pain 05/29/2021   Primary open angle glaucoma (POAG) of both eyes, moderate stage 09/12/2021   Sedimentation rate elevation 08/24/2021   Stroke (HCC) 10/2021   Stroke (HCC) 05/2023   Syncope 10/09/2021   Thyroid disease    Type 2 diabetes mellitus (HCC) 01/19/2019   Urgency incontinence 03/15/2020   Formatting of this note might be different from the original.  Added automatically from request for surgery [id number]   Urinary frequency 08/24/2021   Vertigo 12/01/2019   Past Surgical History:  Procedure Laterality Date   LOOP RECORDER INSERTION N/A 06/13/2023   Procedure: LOOP RECORDER INSERTION;   Surgeon: Regan Lemming, MD;  Location: [facility] INVASIVE CV [facility];  Service: Cardiovascular;  Laterality: N/A;   NO PAST SURGERIES     Patient Active Problem List   Diagnosis Date Noted   Paroxysmal atrial fibrillation (HCC) 07/18/2023   Hypercoagulable state due to paroxysmal atrial fibrillation (HCC) 07/18/2023   Acute encephalopathy 06/11/2023   Osteoarthritis of right knee 12/18/2022   Glaucoma 11/26/2021   Cerebrovascular accident (CVA) (HCC) 10/25/2021   History of stroke 10/2021   Syncope 10/09/2021   Primary open angle glaucoma (POAG) of both eyes, moderate stage 09/12/2021   Sedimentation rate elevation 08/24/2021   Bilateral shoulder pain 08/24/2021   Inflammatory arthritis 08/24/2021   Urinary frequency 08/24/2021   COVID 05/29/2021   Post-COVID chronic fatigue 05/29/2021   Post-COVID chronic joint pain 05/29/2021   Post-COVID chronic muscle pain 05/29/2021   BPPV (benign paroxysmal positional vertigo), right 01/03/2021   Thyroid disease    Ganglion cyst 08/25/2020   Mixed incontinence 03/15/2020   Urgency incontinence 03/15/2020   Blepharitis of upper and lower eyelids of both eyes 01/07/2020   Dermatochalasis of both upper eyelids 01/07/2020   Hypertensive retinopathy of both eyes 01/07/2020   Macular RPE mottling 01/07/2020   Meibomian gland dysfunction (MGD) of both eyes 01/07/2020   Vitreous syneresis of both eyes 01/07/2020   Vertigo 12/01/2019   Cervical dystonia 03/08/2019   Hyperlipidemia 02/11/2019   Edema 02/11/2019   Hypertension 01/19/2019  Type 2 diabetes mellitus (HCC) 01/19/2019   Hypothyroidism (acquired) 01/19/2019   Neck pain 01/19/2019   Dystonia 01/19/2019   Adhesive capsulitis of left shoulder 12/22/2018    PCP: [name], PA-C   REFERRING PROVIDER: Zola Button, Grayling Congress, *   REFERRING DIAG:  M25.50 (ICD-10-CM) - Arthralgia, unspecified joint  Z91.81 (ICD-10-CM) - At risk for fall due to comorbid condition    THERAPY DIAG:   Unsteadiness on feet  Muscle weakness (generalized)  Chronic pain of both shoulders  Rationale for Evaluation and Treatment: Rehabilitation  ONSET DATE: Hospitalization on 06/11/2023  SUBJECTIVE:   SUBJECTIVE STATEMENT: 09/29/2023 her shoulders still feel very good. L wrist hurts 8/10   From MD notes: Patient had history of chronic body pain which was managed with prednisone, but due to affect on her glaucoma, replaced by meloxicam by rheumatologist, but had to stop for ASA/plavis therapy for Afib, affecting her QOL.      PERTINENT HISTORY: AFIB, HTN, ischemic CVA, aortic atherosclerosis by CT imaging, cervical dystonia, chronic tremor, glaucoma, hypothyroidism, T2DM, and paroxysmal atrial fibrillation, Recent hospital admission 10/30-11/1 for CVA and underwent ILR placement on 06/13/23; arthritic complaints, specifically upper shoulders bilaterally   PAIN:  Are you having pain? Yes: NPRS scale: 1/10 Pain location: both shoulders, 8/10 L wrist Pain description: ache, stiff  PRECAUTIONS: Fall  Implanted loop recorder.   RED FLAGS: None   WEIGHT BEARING RESTRICTIONS: No  FALLS:  Has patient fallen in last 6 months? No  LIVING ENVIRONMENT: Lives with: lives alone sons live close  Lives in: House/apartment Stairs: No Has following equipment at home: Single point cane and Environmental consultant - 2 wheeled  OCCUPATION: retired  PLOF: Independent with basic ADLs and Independent with household mobility with device  PATIENT GOALS: improve her mobility  NEXT MD VISIT: 09/16/23 with rheumatologist  OBJECTIVE:  Note: Objective measures were completed at Evaluation unless otherwise noted.  DIAGNOSTIC FINDINGS:  06/12/23 MR Brain IMPRESSION: 1. Small acute/subacute infarcts in the left sub insular region and posterior left temporal lobe. 2. Remote infarcts in the left occipital lobe, thalami and bilateral cerebellar hemispheres. 3. Advanced chronic microvascular ischemic changes of the  white matter. 4. At least moderate spinal canal stenosis at C3-4.  PATIENT SURVEYS:  ABC scale 650/1600 = 40.6%  COGNITION: Overall cognitive status: Impaired appears to have memory deficits, asked therapists name several times throughout session.      POSTURE: rounded shoulders and forward head  PALPATION: Noted decreased joint mobility in distal radial/ulnar, carpal joints bilaterally limiting wrist ROM  UPPER EXTREMITY MMT:  MMT Right eval Left eval  Shoulder flexion 3 3  Shoulder extension    Shoulder abduction 4 4  Shoulder adduction    Shoulder extension    Shoulder internal rotation 4 4  Shoulder external rotation    Elbow flexion 5 5  Elbow extension    Wrist flexion 5 5  Grip strength good good   (Blank rows = not tested)  UPPER EXTREMITY ROM:  Active ROM Right eval Left eval  Shoulder flexion 80a 120aa 75a 120aa  Shoulder abduction 70 70   (Blank rows = not tested)  LOWER EXTREMITY MMT:  MMT Right eval Left eval  Hip flexion 3 3  Hip extension    Hip abduction 3 3  Hip adduction 3 3  Knee flexion 4 4  Knee extension 5 5  Ankle dorsiflexion    Ankle plantarflexion     (Blank rows = not tested)  FUNCTIONAL TESTS:  5 times sit to stand: 46 sec MCTSIB: Condition 1: Avg of 3 trials: 30 sec, Condition 2: Avg of 3 trials: 30 sec, Condition 3: Avg of 3 trials: 9 sec, Condition 4: Avg of 3 trials: 0 sec, and Total Score: 69/120  GAIT: Distance walked: 50 Assistive device utilized: Walker - 2 wheeled Level of assistance: Modified independence Comments: Gait speed 0.5 m/s                                                                                                                                TREATMENT DATE:  09/29/23 Therapeutic Exercise: to improve strength and mobility.  Demo, verbal and tactile cues throughout for technique. Pulleys x 5 min  Sit to stands x 10 min  Seated LAQ with 2# weights x 10 r/L Nustep L5 x 10 min - by  request Therapeutic Activity:  to improve functional mobility and gait Gait x 113ft without AD- close SBA for safety Standing marching 2# x 30 no support, SBA for safety   09/25/23 Therapeutic Exercise: to improve strength and mobility.  Demo, verbal and tactile cues throughout for technique. Pulleys x 3 min  Sit to stands x 10  Seated LAQ with 2# weights x 10 r/L Seated marches 2# x 20 Gentle wrist stretches and circles Standing marching 2# x 30 Standing heel raises x 10 2# Manual Therapy: to decrease muscle spasm and pain and improve mobility Gentle mobs to bil wrists.  Mobility in L wrist extremely limited.   09/22/23 Therapeutic Exercise: to improve strength and mobility.  Demo, verbal and tactile cues throughout for technique. Nustep L3 x 8 min Seated table slides flexion bil x 2 min  Seated table slides scaption r/l x 2 min each Shoulder rolls retro. At counter with bil UE support and CGA for safety: Standing marching x 20 Standing heel raises x 20 Standing toe raises x 10 Standing hip abduction x 15 r/l Manual Therapy: to decrease muscle spasm and pain and improve mobility STM/TPR to bil UT, cervical paraspinals, pectoralis, gentle scapular mobs.    PATIENT EDUCATION:  Education details: information provided on adjustable ankle weights and arm pulleys Person educated: Patient Education method: Explanation and Handouts Education comprehension: verbalized understanding  HOME EXERCISE PROGRAM: Access Code: [email or web address] URL: [email or web address] Date: 09/22/2023 Prepared by: Harrie Foreman  Exercises - Sit to Stand with Counter Support  - 1 x daily - 7 x weekly - 2 sets - 5 reps - Seated Bilateral Shoulder Flexion Towel Slide at Table Top  - 1 x daily - 7 x weekly - 2 sets - 10 reps - Seated Shoulder Scaption Slide at Table Top with Forearm in Neutral  - 1 x daily - 7 x weekly - 2 sets - 10 reps - Heel Raises with Counter Support  - 1 x daily - 7 x weekly  - 2 sets - 10 reps - Standing Hip Abduction with Counter Support  - 1  x daily - 7 x weekly - 2 sets - 10 reps - Standing March with Counter Support  - 1 x daily - 7 x weekly - 2 sets - 10 reps  Patient Education - How to Prevent Falls - Spanish  ASSESSMENT:  CLINICAL IMPRESSION: Leslie Ward  is a 89 y.o. female referred to OPPT for evaluation and treatment  for generalized arthralgia and increased risk of falls.  She denied pain today, main concern was working towards being able to walk again without walker, so focused on LE strengthening.  Still fatigues very quickly with exercises, needed frequent breaks and redirection as well, with gait needed cues to stay in middle of path to avoid tripping over equipment, but other than very slow gait speed, had no LOB today.   Leslie Ward continues to demonstrate potential for improvement and would benefit from continued skilled therapy to address impairments.      OBJECTIVE IMPAIRMENTS: Abnormal gait, decreased activity tolerance, decreased balance, decreased endurance, decreased knowledge of condition, decreased mobility, difficulty walking, decreased ROM, decreased strength, decreased safety awareness, hypomobility, increased fascial restrictions, impaired perceived functional ability, increased muscle spasms, impaired flexibility, impaired UE functional use, and pain.   ACTIVITY LIMITATIONS: carrying, lifting, bending, standing, squatting, transfers, dressing, reach over head, hygiene/grooming, and locomotion level  PARTICIPATION LIMITATIONS: meal prep, cleaning, laundry, and community activity  PERSONAL FACTORS: Age, Fitness, Past/current experiences, Time since onset of injury/illness/exacerbation, and 3+ comorbidities: AFIB, HTN, ischemic CVA, aortic atherosclerosis by CT imaging, cervical dystonia, chronic tremor, glaucoma, hypothyroidism, T2DM, and paroxysmal atrial fibrillation, Recent hospital admission 10/30-11/1 for CVA and underwent  ILR placement on 06/13/23; arthritic complaints, specifically upper shoulders bilaterally   are also affecting patient's functional outcome.   REHAB POTENTIAL: Good  CLINICAL DECISION MAKING: Evolving/moderate complexity  EVALUATION COMPLEXITY: Moderate   GOALS: Goals reviewed with patient? Yes  SHORT TERM GOALS: Target date: 09/24/2023   Patient will be independent with initial HEP. Baseline: given Goal status: MET 09/25/23 reports compliance with table slides and sit to stands, does not remember updates.  09/29/23- MET  2.  Patient will be educated on strategies to decrease risk of falls.  Baseline:  Goal status: MET 09/23/23 Handout provided.    LONG TERM GOALS: Target date: 11/19/2023   Patient will be independent with advanced/ongoing HEP to improve outcomes and carryover.  Baseline:  Goal status: IN PROGRESS  2.  Patient will be able to ambulate 600' with LRAD with good safety to access community.  Baseline:  Goal status: IN PROGRESS  3. Patient will demonstrate improved functional LE strength as demonstrated by 5x STS < 30 seconds. Baseline: 46 seconds Goal status: IN PROGRESS  4.  Patient will demonstrate > 19/28 on Tinetti to improve gait stability and reduce risk for falls. Baseline: NT Goal status: IN PROGRESS  5.  Patient will report 50% improvement in bil shoulder mobility to improve ease of dressing and ADLs.  Baseline:  Goal status: IN PROGRESS  6.  Patient will report 14 points improvement on ABC scale to demonstrate improved functional ability. Baseline: 40.6% confidence Goal status: IN PROGRESS  7.  Patient will demonstrate gait speed of >0.55 m/s) to be a safe limited community ambulator with decreased risk for recurrent falls.  Baseline: 0.5 m/s Goal status: IN PROGRESS   PLAN:  PT FREQUENCY: 1-2x/week  PT DURATION: 10 weeks  PLANNED INTERVENTIONS: 97110-Therapeutic exercises, 97530- Therapeutic activity, [id number]- Neuromuscular re-education,  97535- Self Care, [id number]- Manual therapy, [id number]- Gait training, [phone]- Ultrasound, Patient/Family  education, Balance training, Stair training, Taping, Dry Needling, Joint mobilization, Joint manipulation, Spinal manipulation, Spinal mobilization, Cryotherapy, and Moist heat  PLAN FOR NEXT SESSION:  postural strengthening, focus on OTAGOs exercises - doing medbridge so exercise instructions in spanish, review fall prevention handout, Modalities PRN.   Has implanted loop recorder.    Jena Gauss, PT 09/29/2023, 11:56 AM

## 2023-10-02 ENCOUNTER — Ambulatory Visit: Payer: 59 | Admitting: Physical Therapy

## 2023-10-06 ENCOUNTER — Ambulatory Visit: Payer: 59

## 2023-10-09 ENCOUNTER — Ambulatory Visit: Payer: 59 | Admitting: Physical Therapy

## 2023-10-09 ENCOUNTER — Encounter: Payer: Self-pay | Admitting: Physical Therapy

## 2023-10-09 DIAGNOSIS — M6281 Muscle weakness (generalized): Secondary | ICD-10-CM | POA: Diagnosis not present

## 2023-10-09 DIAGNOSIS — R2681 Unsteadiness on feet: Secondary | ICD-10-CM

## 2023-10-09 DIAGNOSIS — G8929 Other chronic pain: Secondary | ICD-10-CM | POA: Diagnosis not present

## 2023-10-09 DIAGNOSIS — M25512 Pain in left shoulder: Secondary | ICD-10-CM | POA: Diagnosis not present

## 2023-10-09 DIAGNOSIS — M25511 Pain in right shoulder: Secondary | ICD-10-CM | POA: Diagnosis not present

## 2023-10-09 NOTE — Therapy (Signed)
 OUTPATIENT PHYSICAL THERAPY TREATMENT   Patient Name: Leslie Ward MRN: [id number] DOB:30-Jun-1931, 89 y.o., female Today's Date: 10/09/2023  END OF SESSION:  PT End of Session - 10/09/23 1113     Visit Number 5    Date for PT Re-Evaluation 11/19/23    Authorization Type [employer] & Medicaid    Progress Note Due on Visit 10    PT Start Time 1110    PT Stop Time 1145    PT Time Calculation (min) 35 min    Activity Tolerance Patient tolerated treatment well    Behavior During Therapy [facility] for tasks assessed/performed              Past Medical History:  Diagnosis Date   Adhesive capsulitis of left shoulder 12/22/2018   Bilateral shoulder pain 08/24/2021   Cerebrovascular accident (CVA) (HCC) 10/25/2021   Cervical dystonia 03/08/2019   [employer] approved diagnosis.   COVID 05/29/2021   Dystonia    Edema 02/11/2019   Ganglion cyst 08/25/2020   Glaucoma    Hyperlipidemia 02/11/2019   Hypertension    Hypothyroidism (acquired) 01/19/2019   Inflammatory arthritis 08/24/2021   Mixed incontinence 03/15/2020   Formatting of this note might be different from the original.  Added automatically from request for surgery [id number]   Neck pain 01/19/2019   Post-COVID chronic fatigue 05/29/2021   Post-COVID chronic joint pain 05/29/2021   Post-COVID chronic muscle pain 05/29/2021   Primary open angle glaucoma (POAG) of both eyes, moderate stage 09/12/2021   Sedimentation rate elevation 08/24/2021   Stroke (HCC) 10/2021   Stroke (HCC) 05/2023   Syncope 10/09/2021   Thyroid disease    Type 2 diabetes mellitus (HCC) 01/19/2019   Urgency incontinence 03/15/2020   Formatting of this note might be different from the original.  Added automatically from request for surgery [id number]   Urinary frequency 08/24/2021   Vertigo 12/01/2019   Past Surgical History:  Procedure Laterality Date   LOOP RECORDER INSERTION N/A 06/13/2023   Procedure: LOOP RECORDER INSERTION;  Surgeon: Regan Lemming, MD;  Location: [facility] INV[facility] CV [facility];  Service: Cardiovascular;  Laterality: N/A;   NO PAST SURGERIES     Patient Active Problem List   Diagnosis Date Noted   Paroxysmal atrial fibrillation (HCC) 07/18/2023   Hypercoagulable state due to paroxysmal atrial fibrillation (HCC) 07/18/2023   Acute encephalopathy 06/11/2023   Osteoarthritis of right knee 12/18/2022   Glaucoma 11/26/2021   Cerebrovascular accident (CVA) (HCC) 10/25/2021   History of stroke 10/2021   Syncope 10/09/2021   Primary open angle glaucoma (POAG) of both eyes, moderate stage 09/12/2021   Sedimentation rate elevation 08/24/2021   Bilateral shoulder pain 08/24/2021   Inflammatory arthritis 08/24/2021   Urinary frequency 08/24/2021   COVID 05/29/2021   Post-COVID chronic fatigue 05/29/2021   Post-COVID chronic joint pain 05/29/2021   Post-COVID chronic muscle pain 05/29/2021   BPPV (benign paroxysmal positional vertigo), right 01/03/2021   Thyroid disease    Ganglion cyst 08/25/2020   Mixed incontinence 03/15/2020   Urgency incontinence 03/15/2020   Blepharitis of upper and lower eyelids of both eyes 01/07/2020   Dermatochalasis of both upper eyelids 01/07/2020   Hypertensive retinopathy of both eyes 01/07/2020   Macular RPE mottling 01/07/2020   Meibomian gland dysfunction (MGD) of both eyes 01/07/2020   Vitreous syneresis of both eyes 01/07/2020   Vertigo 12/01/2019   Cervical dystonia 03/08/2019   Hyperlipidemia 02/11/2019   Edema 02/11/2019   Hypertension 01/19/2019   Type 2  diabetes mellitus (HCC) 01/19/2019   Hypothyroidism (acquired) 01/19/2019   Neck pain 01/19/2019   Dystonia 01/19/2019   Adhesive capsulitis of left shoulder 12/22/2018    PCP: [name], PA-C   REFERRING PROVIDER: Zola Button, Grayling Congress, *   REFERRING DIAG:  M25.50 (ICD-10-CM) - Arthralgia, unspecified joint  Z91.81 (ICD-10-CM) - At risk for fall due to comorbid condition    THERAPY DIAG:  Unsteadiness on  feet  Muscle weakness (generalized)  Chronic pain of both shoulders  Rationale for Evaluation and Treatment: Rehabilitation  ONSET DATE: Hospitalization on 06/11/2023  SUBJECTIVE:   SUBJECTIVE STATEMENT: 10/09/2023 she was dizzy yesterday but feels better today.  Denies pain.  She got the floor bike and has been using it at home.   From MD notes: Patient had history of chronic body pain which was managed with prednisone, but due to affect on her glaucoma, replaced by meloxicam by rheumatologist, but had to stop for ASA/plavis therapy for Afib, affecting her QOL.      PERTINENT HISTORY: AFIB, HTN, ischemic CVA, aortic atherosclerosis by CT imaging, cervical dystonia, chronic tremor, glaucoma, hypothyroidism, T2DM, and paroxysmal atrial fibrillation, Recent hospital admission 10/30-11/1 for CVA and underwent ILR placement on 06/13/23; arthritic complaints, specifically upper shoulders bilaterally   PAIN:  Are you having pain? Yes: NPRS scale: 1/10 Pain location: both shoulders, 8/10 L wrist Pain description: ache, stiff  PRECAUTIONS: Fall  Implanted loop recorder.   RED FLAGS: None   WEIGHT BEARING RESTRICTIONS: No  FALLS:  Has patient fallen in last 6 months? No  LIVING ENVIRONMENT: Lives with: lives alone sons live close  Lives in: House/apartment Stairs: No Has following equipment at home: Single point cane and Environmental consultant - 2 wheeled  OCCUPATION: retired  PLOF: Independent with basic ADLs and Independent with household mobility with device  PATIENT GOALS: improve her mobility  NEXT MD VISIT: 09/16/23 with rheumatologist  OBJECTIVE:  Note: Objective measures were completed at Evaluation unless otherwise noted.  DIAGNOSTIC FINDINGS:  06/12/23 MR Brain IMPRESSION: 1. Small acute/subacute infarcts in the left sub insular region and posterior left temporal lobe. 2. Remote infarcts in the left occipital lobe, thalami and bilateral cerebellar hemispheres. 3. Advanced  chronic microvascular ischemic changes of the white matter. 4. At least moderate spinal canal stenosis at C3-4.  PATIENT SURVEYS:  ABC scale 650/1600 = 40.6%  COGNITION: Overall cognitive status: Impaired appears to have memory deficits, asked therapists name several times throughout session.      POSTURE: rounded shoulders and forward head  PALPATION: Noted decreased joint mobility in distal radial/ulnar, carpal joints bilaterally limiting wrist ROM  UPPER EXTREMITY MMT:  MMT Right eval Left eval  Shoulder flexion 3 3  Shoulder extension    Shoulder abduction 4 4  Shoulder adduction    Shoulder extension    Shoulder internal rotation 4 4  Shoulder external rotation    Elbow flexion 5 5  Elbow extension    Wrist flexion 5 5  Grip strength good good   (Blank rows = not tested)  UPPER EXTREMITY ROM:  Active ROM Right eval Left eval  Shoulder flexion 80a 120aa 75a 120aa  Shoulder abduction 70 70   (Blank rows = not tested)  LOWER EXTREMITY MMT:  MMT Right eval Left eval  Hip flexion 3 3  Hip extension    Hip abduction 3 3  Hip adduction 3 3  Knee flexion 4 4  Knee extension 5 5  Ankle dorsiflexion    Ankle plantarflexion     (  Blank rows = not tested)  FUNCTIONAL TESTS:  5 times sit to stand: 46 sec MCTSIB: Condition 1: Avg of 3 trials: 30 sec, Condition 2: Avg of 3 trials: 30 sec, Condition 3: Avg of 3 trials: 9 sec, Condition 4: Avg of 3 trials: 0 sec, and Total Score: 69/120  GAIT: Distance walked: 50 Assistive device utilized: Walker - 2 wheeled Level of assistance: Modified independence Comments: Gait speed 0.5 m/s                                                                                                                                TREATMENT DATE:   10/09/23 Therapeutic Exercise: to improve strength and mobility.  Demo, verbal and tactile cues throughout for technique. Pulleys x 4 min  Nustep L5 x 5 min  Sit to stands 2 x 10  -with  bil UE support from walker: Standing marching x 20 Standing heel raises x 20 Standing hip abduction 2 x 10 bil  Standing hip extension x 10 bil  Manual Therapy: to improve mobility Joint mobs to bil wrists focusing on distal radial-ulnar joint    09/29/23 Therapeutic Exercise: to improve strength and mobility.  Demo, verbal and tactile cues throughout for technique. Pulleys x 5 min  Sit to stands x 10 min  Seated LAQ with 2# weights x 10 r/L Nustep L5 x 10 min - by request Therapeutic Activity:  to improve functional mobility and gait Gait x 172ft without AD- close SBA for safety Standing marching 2# x 30 no support, SBA for safety   09/25/23 Therapeutic Exercise: to improve strength and mobility.  Demo, verbal and tactile cues throughout for technique. Pulleys x 3 min  Sit to stands x 10  Seated LAQ with 2# weights x 10 r/L Seated marches 2# x 20 Gentle wrist stretches and circles Standing marching 2# x 30 Standing heel raises x 10 2# Manual Therapy: to decrease muscle spasm and pain and improve mobility Gentle mobs to bil wrists.  Mobility in L wrist extremely limited.   09/22/23 Therapeutic Exercise: to improve strength and mobility.  Demo, verbal and tactile cues throughout for technique. Nustep L3 x 8 min Seated table slides flexion bil x 2 min  Seated table slides scaption r/l x 2 min each Shoulder rolls retro. At counter with bil UE support and CGA for safety: Standing marching x 20 Standing heel raises x 20 Standing toe raises x 10 Standing hip abduction x 15 r/l Manual Therapy: to decrease muscle spasm and pain and improve mobility STM/TPR to bil UT, cervical paraspinals, pectoralis, gentle scapular mobs.    PATIENT EDUCATION:  Education details: information provided on adjustable ankle weights and arm pulleys Person educated: Patient Education method: Explanation and Handouts Education comprehension: verbalized understanding  HOME EXERCISE PROGRAM: Access  Code: [email or web address] URL: [email or web address] Date: 09/22/2023 Prepared by: Harrie Foreman  Exercises - Sit to Stand with Counter Support  - 1 x daily -  7 x weekly - 2 sets - 5 reps - Seated Bilateral Shoulder Flexion Towel Slide at Table Top  - 1 x daily - 7 x weekly - 2 sets - 10 reps - Seated Shoulder Scaption Slide at Table Top with Forearm in Neutral  - 1 x daily - 7 x weekly - 2 sets - 10 reps - Heel Raises with Counter Support  - 1 x daily - 7 x weekly - 2 sets - 10 reps - Standing Hip Abduction with Counter Support  - 1 x daily - 7 x weekly - 2 sets - 10 reps - Standing March with Counter Support  - 1 x daily - 7 x weekly - 2 sets - 10 reps  Patient Education - How to Prevent Falls - Spanish  ASSESSMENT:  CLINICAL IMPRESSION: Leslie Ward  is a 89 y.o. female referred to OPPT for evaluation and treatment  for generalized arthralgia and increased risk of falls.  Arrived late today.  She denied pain today, continued to focus on LE strengthening.  She reported some wrist pain yesterday, so also worked on her wrists again, noted significant improvement in bil wrist mobility after gentle mobilization today.    Leslie Ward continues to demonstrate potential for improvement and would benefit from continued skilled therapy to address impairments.      OBJECTIVE IMPAIRMENTS: Abnormal gait, decreased activity tolerance, decreased balance, decreased endurance, decreased knowledge of condition, decreased mobility, difficulty walking, decreased ROM, decreased strength, decreased safety awareness, hypomobility, increased fascial restrictions, impaired perceived functional ability, increased muscle spasms, impaired flexibility, impaired UE functional use, and pain.   ACTIVITY LIMITATIONS: carrying, lifting, bending, standing, squatting, transfers, dressing, reach over head, hygiene/grooming, and locomotion level  PARTICIPATION LIMITATIONS: meal prep, cleaning, laundry, and  community activity  PERSONAL FACTORS: Age, Fitness, Past/current experiences, Time since onset of injury/illness/exacerbation, and 3+ comorbidities: AFIB, HTN, ischemic CVA, aortic atherosclerosis by CT imaging, cervical dystonia, chronic tremor, glaucoma, hypothyroidism, T2DM, and paroxysmal atrial fibrillation, Recent hospital admission 10/30-11/1 for CVA and underwent ILR placement on 06/13/23; arthritic complaints, specifically upper shoulders bilaterally   are also affecting patient's functional outcome.   REHAB POTENTIAL: Good  CLINICAL DECISION MAKING: Evolving/moderate complexity  EVALUATION COMPLEXITY: Moderate   GOALS: Goals reviewed with patient? Yes  SHORT TERM GOALS: Target date: 09/24/2023   Patient will be independent with initial HEP. Baseline: given Goal status: MET 09/25/23 reports compliance with table slides and sit to stands, does not remember updates.  09/29/23- MET  2.  Patient will be educated on strategies to decrease risk of falls.  Baseline:  Goal status: MET 09/23/23 Handout provided.    LONG TERM GOALS: Target date: 11/19/2023   Patient will be independent with advanced/ongoing HEP to improve outcomes and carryover.  Baseline:  Goal status: IN PROGRESS  2.  Patient will be able to ambulate 600' with LRAD with good safety to access community.  Baseline:  Goal status: IN PROGRESS  3. Patient will demonstrate improved functional LE strength as demonstrated by 5x STS < 30 seconds. Baseline: 46 seconds Goal status: IN PROGRESS  4.  Patient will demonstrate > 19/28 on Tinetti to improve gait stability and reduce risk for falls. Baseline: NT Goal status: IN PROGRESS  5.  Patient will report 50% improvement in bil shoulder mobility to improve ease of dressing and ADLs.  Baseline:  Goal status: IN PROGRESS 10/09/23 - reports no pain  6.  Patient will report 14 points improvement on ABC scale to demonstrate improved functional  ability. Baseline: 40.6%  confidence Goal status: IN PROGRESS  7.  Patient will demonstrate gait speed of >0.55 m/s) to be a safe limited community ambulator with decreased risk for recurrent falls.  Baseline: 0.5 m/s Goal status: IN PROGRESS   PLAN:  PT FREQUENCY: 1-2x/week  PT DURATION: 10 weeks  PLANNED INTERVENTIONS: 97110-Therapeutic exercises, 97530- Therapeutic activity, 97112- Neuromuscular re-education, 97535- Self Care, 40981- Manual therapy, [phone]- Gait training, [phone]- Ultrasound, Patient/Family education, Balance training, Stair training, Taping, Dry Needling, Joint mobilization, Joint manipulation, Spinal manipulation, Spinal mobilization, Cryotherapy, and Moist heat  PLAN FOR NEXT SESSION:  postural strengthening, focus on OTAGOs exercises - doing medbridge so exercise instructions in spanish, review fall prevention handout, Modalities PRN.   Has implanted loop recorder.    Jena Gauss, PT 10/09/2023, 12:14 PM

## 2023-10-10 ENCOUNTER — Emergency Department (HOSPITAL_BASED_OUTPATIENT_CLINIC_OR_DEPARTMENT_OTHER): Payer: 59

## 2023-10-10 ENCOUNTER — Emergency Department (HOSPITAL_BASED_OUTPATIENT_CLINIC_OR_DEPARTMENT_OTHER)
Admission: EM | Admit: 2023-10-10 | Discharge: 2023-10-10 | Disposition: A | Discharge status: Home / Self Care | Payer: 59 | Attending: Emergency Medicine | Admitting: Emergency Medicine

## 2023-10-10 ENCOUNTER — Encounter (HOSPITAL_BASED_OUTPATIENT_CLINIC_OR_DEPARTMENT_OTHER): Payer: Self-pay | Admitting: Urology

## 2023-10-10 DIAGNOSIS — Z7901 Long term (current) use of anticoagulants: Secondary | ICD-10-CM | POA: Diagnosis not present

## 2023-10-10 DIAGNOSIS — I4891 Unspecified atrial fibrillation: Secondary | ICD-10-CM | POA: Insufficient documentation

## 2023-10-10 DIAGNOSIS — I6523 Occlusion and stenosis of bilateral carotid arteries: Secondary | ICD-10-CM | POA: Diagnosis not present

## 2023-10-10 DIAGNOSIS — R42 Dizziness and giddiness: Secondary | ICD-10-CM | POA: Diagnosis present

## 2023-10-10 DIAGNOSIS — R519 Headache, unspecified: Secondary | ICD-10-CM | POA: Diagnosis not present

## 2023-10-10 DIAGNOSIS — R9082 White matter disease, unspecified: Secondary | ICD-10-CM | POA: Diagnosis not present

## 2023-10-10 DIAGNOSIS — H81399 Other peripheral vertigo, unspecified ear: Secondary | ICD-10-CM | POA: Insufficient documentation

## 2023-10-10 LAB — BASIC METABOLIC PANEL
Anion gap: 7 (ref 5–15)
BUN: 27 mg/dL — ABNORMAL HIGH (ref 8–23)
CO2: 24 mmol/L (ref 22–32)
Calcium: 9.1 mg/dL (ref 8.9–10.3)
Chloride: 104 mmol/L (ref 98–111)
Creatinine, Ser: 0.69 mg/dL (ref 0.44–1.00)
GFR, Estimated: >60 mL/min (ref >60)
Glucose, Bld: 107 mg/dL — ABNORMAL HIGH (ref 70–99)
Potassium: 3.8 mmol/L (ref 3.5–5.1)
Sodium: 135 mmol/L (ref 135–145)

## 2023-10-10 LAB — CBC
HCT: 39.7 % (ref 36.0–46.0)
Hemoglobin: 12.8 g/dL (ref 12.0–15.0)
MCH: 29.7 pg (ref 26.0–34.0)
MCHC: 32.2 g/dL (ref 30.0–36.0)
MCV: 92.1 fL (ref 80.0–100.0)
Platelets: 235 10*3/uL (ref 150–400)
RBC: 4.31 MIL/uL (ref 3.87–5.11)
RDW: 15.7 % — ABNORMAL HIGH (ref 11.5–15.5)
WBC: 8.6 10*3/uL (ref 4.0–10.5)
nRBC: 0 % (ref 0.0–0.2)

## 2023-10-10 LAB — TROPONIN I (HIGH SENSITIVITY)
Troponin I (High Sensitivity): 4 ng/L (ref <18)
Troponin I (High Sensitivity): 4 ng/L (ref <18)

## 2023-10-10 MED ORDER — MECLIZINE HCL 25 MG PO TABS
12.5000 mg | ORAL_TABLET | Freq: Once | ORAL | Status: AC
  Administered 2023-10-10: 12.5 mg via ORAL
  Filled 2023-10-10: qty 1

## 2023-10-10 MED ORDER — MECLIZINE HCL 25 MG PO TABS: 12.5000 mg | ORAL_TABLET | Freq: Three times a day (TID) | ORAL | 0 refills | Status: DC | PRN

## 2023-10-10 NOTE — ED Triage Notes (Signed)
 Pt states has been dizzy x 3 days, states BP was 150 systolic,  Reports slight headache  Denies weakness   H/o stroke last year 05/2023

## 2023-10-10 NOTE — Discharge Instructions (Signed)
 Contact a health care provider if: You have a fever. Your condition gets worse or you develop new symptoms. Your family or friends notice any behavioral changes. You have nausea or vomiting that gets worse. You have numbness or a prickling and tingling sensation. Get help right away if you: Have difficulty speaking or moving. Are always dizzy or faint. Develop severe headaches. Have weakness in your legs or arms. Have changes in your hearing or vision. Develop a stiff neck. Develop sensitivity to light. These symptoms may represent a serious problem that is an emergency. Do not wait to see if the symptoms will go away. Get medical help right away. Call your local emergency services (911 in the [place] Do not drive yourself to the hospital.

## 2023-10-10 NOTE — ED Provider Notes (Signed)
 [facility] Provider Note   CSN: [id number] Arrival date & time: 10/10/23  1330     History  Chief Complaint  Patient presents with   Dizziness    Leslie Ward is a 89 y.o. female who presents to the emergency department with complaint of head spinning.  She has a past medical history of BPPV, A-fib Eliquis, history of embolic stroke, lacunar infarct.  Patient has had 3 days of complaint of sinus "spinning in my head.".  She states this is similar to previous episodes of peripheral vertigo.  She has not taken anything for it.  She denies black or tarry stools, lightheadedness with standing, racing or skipping in her heart, changes in vision, headache, weakness on one side of the body. Patient offered professional translation services however she prefers to use her son.  She does speak English well and is able to answer many questions without translation. Has had no changes in the doses of her medications.  She has been taking her medications as directed.   Dizziness      Home Medications Prior to Admission medications   Medication Sig Start Date End Date Taking? Authorizing Provider  meclizine (ANTIVERT) 25 MG tablet Take 0.5-1 tablets (12.5-25 mg total) by mouth 3 (three) times daily as needed for dizziness. 10/10/23  Yes Arthor Captain, PA-C  acetaminophen (TYLENOL) 650 MG CR tablet Take 650 mg by mouth every 8 (eight) hours as needed for pain.    [provider]  amLODipine (NORVASC) 5 MG tablet 1 tab po bid 07/09/23   Saguier, Ramon Dredge, PA-C  apixaban (ELIQUIS) 2.5 MG TABS tablet TAKE 1 TABLET BY MOUTH 2 TIMES A DAY 09/17/23   Eustace Pen, PA-C  atorvastatin (LIPITOR) 20 MG tablet Take 1 tablet (20 mg total) by mouth daily. 07/04/23   Saguier, Ramon Dredge, PA-C  Calcium Carbonate (CALCIUM 600 PO) Take 1 tablet by mouth daily.    [provider]  Cholecalciferol (VITAMIN D) 125 MCG (5000 UT) CAPS Take 5,000 Units by  mouth daily.    [provider]  Cinnamon 500 MG capsule Take 1,000 mg by mouth daily.    [provider]  dorzolamide-timolol (COSOPT) 2-0.5 % ophthalmic solution Place 1 drop into both eyes 2 (two) times daily. 10/30/22   [provider]  hydrocortisone (ANUSOL-HC) 25 MG suppository Place 1 suppository (25 mg total) rectally 2 (two) times daily. Patient taking differently: Place 25 mg rectally as needed. 11/11/22   Saguier, Ramon Dredge, PA-C  latanoprost (XALATAN) 0.005 % ophthalmic solution Place 1 drop into both eyes every evening. 09/12/21   [provider]  levothyroxine (SYNTHROID) 50 MCG tablet TAKE 1 TABLET BY MOUTH DAILY BEFORE BREAKFAST 09/24/23   Saguier, Ramon Dredge, PA-C  Menaquinone-7 (VITAMIN K2 PO) Take 1 tablet by mouth at bedtime. Patient not taking: Reported on 09/16/2023    [provider]  metoprolol tartrate (LOPRESSOR) 50 MG tablet Take 1 tablet (50 mg total) by mouth 2 (two) times daily. 06/18/23   Elgergawy, Leana Roe, MD  Phytosterol Esters (CHOLEST CARE PO) Take 1,600 mg by mouth daily.    [provider]  Probiotic Product (PROBIOTIC DAILY PO) Take 1 tablet by mouth at bedtime.    [provider]  vitamin C (ASCORBIC ACID) 500 MG tablet Take 500 mg by mouth daily.    [provider]      Allergies    Crestor [rosuvastatin calcium]    Review of Systems  Review of Systems  Neurological:  Positive for dizziness.    Physical Exam Updated Vital Signs BP (!) 157/57   Pulse 75   Temp 98.1 F (36.7 C) (Oral)   Resp 20   Ht 4\' 9"  (1.448 m)   Wt 53.1 kg   SpO2 98%   BMI 25.33 kg/m  Physical Exam Vitals and nursing note reviewed.  Constitutional:      General: She is not in acute distress.    Appearance: She is well-developed. She is not diaphoretic.  HENT:     Head: Normocephalic and atraumatic.     Right Ear: External ear normal.     Left Ear: External ear normal.     Nose: Nose normal.      Mouth/Throat:     Mouth: Mucous membranes are moist.  Eyes:     General: No scleral icterus.    Extraocular Movements: Extraocular movements intact.     Right eye: Normal extraocular motion and no nystagmus.     Left eye: Normal extraocular motion and no nystagmus.     Conjunctiva/sclera: Conjunctivae normal.  Cardiovascular:     Rate and Rhythm: Normal rate and regular rhythm.     Heart sounds: Normal heart sounds. No murmur heard.    No friction rub. No gallop.  Pulmonary:     Effort: Pulmonary effort is normal. No respiratory distress.     Breath sounds: Normal breath sounds.  Abdominal:     General: Bowel sounds are normal. There is no distension.     Palpations: Abdomen is soft. There is no mass.     Tenderness: There is no abdominal tenderness. There is no guarding.  Musculoskeletal:     Cervical back: Normal range of motion.  Skin:    General: Skin is warm and dry.  Neurological:     General: No focal deficit present.     Mental Status: She is alert and oriented to person, place, and time.     Cranial Nerves: No cranial nerve deficit.     Sensory: No sensory deficit.     Motor: No weakness.     Coordination: Coordination normal.     Gait: Gait normal.     Deep Tendon Reflexes: Reflexes normal.     Comments: Resting tremor of head   Psychiatric:        Behavior: Behavior normal.     ED Results / Procedures / Treatments   Labs (all labs ordered are listed, but only abnormal results are displayed) Labs Reviewed  BASIC METABOLIC PANEL - Abnormal; Notable for the following components:      Result Value   Glucose, Bld 107 (*)    BUN 27 (*)    All other components within normal limits  CBC - Abnormal; Notable for the following components:   RDW 15.7 (*)    All other components within normal limits  TROPONIN I (HIGH SENSITIVITY)  TROPONIN I (HIGH SENSITIVITY)    EKG EKG Interpretation Date/Time:  Friday October 10 2023 13:43:25 EST Ventricular Rate:  72 PR  Interval:  155 QRS Duration:  80 QT Interval:  389 QTC Calculation: 426 R Axis:   21  Text Interpretation: Sinus rhythm Low voltage, precordial leads Poor data quality Baseline wander Confirmed by Linwood Dibbles [phone]) on 10/10/2023 1:47:44 PM  Radiology CT Head Wo Contrast Result Date: 10/10/2023 CLINICAL DATA:  Vertigo, central. Headache. Personal history of infarct. EXAM: CT HEAD WITHOUT CONTRAST TECHNIQUE: Contiguous axial images were obtained from the base  of the skull through the vertex without intravenous contrast. RADIATION DOSE REDUCTION: This exam was performed according to the departmental dose-optimization program which includes automated exposure control, adjustment of the mA and/or kV according to patient size and/or use of iterative reconstruction technique. COMPARISON:  CT head without contrast 06/11/2023. MR head without contrast 06/12/2023. FINDINGS: Brain: Moderate atrophy and diffuse white matter disease is present bilaterally. A remote infarct of the left occipital pole is noted. Remote lacunar infarcts of the thalami are present, right greater than left. Basal ganglia are intact. Insular ribbon is normal. No acute cortical infarct is present. No acute hemorrhage or mass lesion is present. The ventricles are of normal size. No significant extraaxial fluid collection is present. The brainstem and cerebellum are within normal limits. Midline structures are within normal limits. Vascular: Atherosclerotic calcifications are present within the cavernous internal carotid arteries bilaterally. No hyperdense vessel is present. Skull: Calvarium is intact. No focal lytic or blastic lesions are present. No significant extracranial soft tissue lesion is present. Sinuses/Orbits: The paranasal sinuses and mastoid air cells are clear. Bilateral lens replacements are noted. Globes and orbits are otherwise unremarkable. IMPRESSION: 1. No acute intracranial abnormality or significant interval change. 2.  Moderate atrophy and diffuse white matter disease likely reflects the sequela of chronic microvascular ischemia. 3. Remote infarct of the left occipital pole. 4. Remote lacunar infarcts of the thalami, right greater than left. Electronically Signed   By: Marin Roberts M.D.   On: 10/10/2023 16:42    Procedures Procedures    Medications Ordered in ED Medications  meclizine (ANTIVERT) tablet 12.5 mg (12.5 mg Oral Given 10/10/23 1706)  meclizine (ANTIVERT) tablet 12.5 mg (12.5 mg Oral Given 10/10/23 1828)    ED Course/ Medical Decision Making/ A&P                                 Medical Decision Making Amount and/or Complexity of Data Reviewed Labs: ordered. Radiology: ordered.   This patient presents to the ED for concern of vertigo, this involves an extensive number of treatment options, and is a complaint that carries with it a high risk of complications and morbidity.  The emergent differential diagnosis for acute vertigo low includes peripheral causes such as BPPV, barotrauma, ear foreign body, Mnire's disease, infectious causes such as lip bronchitis, vestibular neuritis or Ramsay Hunt syndrome.  Other emergent causes are central such as cerebellar stroke, vertebrobasilar insufficiency, neoplastic causes, vertebral artery dissection, MS, neurosyphilis or tuberculosis, epilepsy or migraine.  Other causes include anemia, hyperviscosity syndrome, alcohol or aminoglycoside use, renal failure, hypoglycemia and thyroid disease.   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   Social Determinants of Health:   SDOH Screenings   Food Insecurity: No Food Insecurity (06/17/2023)  Housing: Low Risk  (06/12/2023)  Transportation Needs: No Transportation Needs (06/17/2023)  Utilities: Not At  Risk (06/12/2023)  Alcohol Screen: Low Risk  (12/18/2022)  Depression (PHQ2-9): Low Risk  (12/18/2022)  Financial Resource Strain: Low Risk  (10/01/2021)  Physical Activity: Inactive (10/01/2021)  Social Connections: Moderately Isolated (12/18/2022)  Stress: No Stress Concern Present (10/01/2021)  Tobacco Use: Low Risk  (10/10/2023)     Additional history:  {Additional history obtained from son Records reviewed including including previous cardiac evaluations Lab Tests:  I Ordered, and personally interpreted labs.  The pertinent results include:    CBC shows no acute abnormalities.  BMP with mild glucose elevation of insignificant value, troponin negative x 2  Imaging Studies:  I reviewed pretriage ordered imaging studies including CT head I independently visualized and interpreted imaging which showed no acute findings I agree with the radiologist interpretation  Cardiac Monitoring/ECG:  The patient was maintained on a cardiac monitor.  I personally viewed and interpreted the cardiac monitored which showed an underlying rhythm of:  Sinus rhythm at a rate of 72, artifact noted   Medicines ordered and prescription drug management:  I ordered medication including  Medications   meclizine (ANTIVERT) tablet 12.5 mg (12.5 mg Oral Given 10/10/23 1706)  meclizine (ANTIVERT) tablet 12.5 mg (12.5 mg Oral Given 10/10/23 1828)   for vertigo symptoms Reevaluation of the patient after these medicines showed that the patient improved I have reviewed the patients home medicines and have made adjustments as needed  Test Considered:   I considered CT angiogram and MRI however she has no focal deficits on exam.  Subjective spinning in her head is ambulatory without assistance and has a history of BPPV.  All symptoms are consistent with peripheral vertigo  Critical Interventions:    Consultations Obtained:   Problem List / ED Course:     ICD-10-CM   1. Peripheral vertigo, unspecified laterality  H81.399       MDM: Peripheral vertigo, ambulatory, appropriate discharge with outpatient follow-up, meclizine ordered.  Patient seen in shared visit with attending physician. Who agrees with assessment, work up , treatment, and plan for discharge          Final Clinical Impression(s) / ED Diagnoses Final diagnoses:  Peripheral vertigo, unspecified laterality    Rx / DC Orders ED Discharge Orders          Ordered    meclizine (ANTIVERT) 25 MG tablet  3 times daily PRN        10/10/23 1834              Arthor Captain, PA-C 10/10/23 1848    Sloan Leiter, DO 10/12/23 2327

## 2023-10-10 NOTE — ED Notes (Signed)
 Patient transported to CT

## 2023-10-13 ENCOUNTER — Ambulatory Visit (INDEPENDENT_AMBULATORY_CARE_PROVIDER_SITE_OTHER): Payer: Medicare HMO | Admitting: Neurology

## 2023-10-13 ENCOUNTER — Encounter: Payer: Self-pay | Admitting: Neurology

## 2023-10-13 VITALS — BP 142/52 | HR 67 | Ht <= 58 in | Wt 117.6 lb

## 2023-10-13 DIAGNOSIS — R269 Unspecified abnormalities of gait and mobility: Secondary | ICD-10-CM

## 2023-10-13 DIAGNOSIS — G243 Spasmodic torticollis: Secondary | ICD-10-CM

## 2023-10-13 DIAGNOSIS — I639 Cerebral infarction, unspecified: Secondary | ICD-10-CM | POA: Diagnosis not present

## 2023-10-13 HISTORY — DX: Unspecified abnormalities of gait and mobility: R26.9

## 2023-10-13 NOTE — Progress Notes (Signed)
 Chief Complaint  Patient presents with   Room 15    Pt is here with her Son. Pt had a Stroke in October. Pt's son states that pt's memory has declined some since her stroke. Pt's son states that cognitively her ability to understand things has declined. Pt's son states balance is okay. Pt's son would like for pt to get botox in her neck for her cervical dystonia.       ASSESSMENT AND PLAN  Leslie Ward is a 89 y.o. female   Stroke in October 2024  Following that she had a loop recorder in November, 2024  Then found new onset atrial fibrillation on July 09, 2023, on Eliquis now,  Vascular risk factor of aging, hypertension hyperlipidemia   Is receiving home physical therapy  Cervical dystonia  Longstanding, mild response to previous repeat botulism toxin injection, no longer good candidate due to long-term anticoagulation use, suggested neck stretching  Continue follow-up with primary care only return to clinic for new issues   DIAGNOSTIC DATA (LABS, IMAGING, TESTING) - I reviewed patient records, labs, notes, testing and imaging myself where available.   MEDICAL HISTORY:  Leslie Ward, seen in request by  [name], PA-C   History is obtained from the patient and review of electronic medical records. I personally reviewed pertinent available imaging films in PACS.   PMHx of  HLD Hypothyrodism A fib Cervical dystonia Glaucoma HTN Stroke Loop recorder placement on Jun 13 2023.  She had long history of atrial fibrillation, was receiving botulism toxin injection every 3 months only with partial response  Hospital admission in February 2023 after found down at home, MRI of the brain showed acute stroke at the right inferior cerebellum, left medial thalamus  Hospital admission again in October 2024 after found increased confusion  MRI of the brain showed acute stroke involving left subinsular region and posterior left temporal lobe, remote infarction in  the left occipital lobe, thalami, bilateral cerebellar hemisphere, advanced small vessel disease  CT angiogram showed occlusion of distal M3 branch, left V1 V2 stenosis, moderate to severe atherosclerotic stenosis at the right vertebral artery,  She had loop recorder on June 13, 2023, found to be in atrial fibrillation on July 09, 2023, lasting 22 minutes, she was switched to Eliquis from aspirin and Plavix  She has no recurrent event, now moving with her son, ambulate with walker, son reported increased confusion, slower reaction, slow to learn new skills such as operating for TV remote control, or stove, still relatively independent, able to bathe, dressing, toileting without assistance   PHYSICAL EXAM:   Vitals:   10/13/23 1319  BP: (!) 142/52  Pulse: 67  Weight: 117 lb 9.6 oz (53.3 kg)  Height: 4\' 9"  (1.448 m)   Body mass index is 25.45 kg/m.  PHYSICAL EXAMNIATION:  Gen: NAD, conversant, well nourised, well groomed                     Cardiovascular: Regular rate rhythm, no peripheral edema, warm, nontender. Eyes: Conjunctivae clear without exudates or hemorrhage Neck: Supple, no carotid bruits. Pulmonary: Clear to auscultation bilaterally   NEUROLOGICAL EXAM:  MENTAL STATUS: Speech/cognition: Awake, alert, oriented to history taking and casual conversation, constant head titubation CRANIAL NERVES: CN II: Visual fields are full to confrontation. Pupils are round equal and briskly reactive to light. CN III, IV, VI: extraocular movement are normal. No ptosis. CN V: Facial sensation is intact to light touch CN VII: Face is symmetric  with normal eye closure  CN VIII: Hearing is normal to causal conversation. CN IX, X: Phonation is normal. CN XI: Head turning and shoulder shrug are intact  MOTOR: There is no pronator drift of out-stretched arms. Muscle bulk and tone are normal. Muscle strength is normal.  REFLEXES: Reflexes are 1 and symmetric at the biceps,  triceps, knees, and ankles. Plantar responses are flexor.  SENSORY: Intact to light touch, pinprick and vibratory sensation are intact in fingers and toes.  COORDINATION: There is no trunk or limb dysmetria noted.  GAIT/STANCE: Push-up to get up from seated position, cautious  REVIEW OF SYSTEMS:  Full 14 system review of systems performed and notable only for as above All other review of systems were negative.   ALLERGIES: Allergies  Allergen Reactions   Crestor [Rosuvastatin Calcium] Other (See Comments)    "caused me to go into kidney failure"    HOME MEDICATIONS: Current Outpatient Medications  Medication Sig Dispense Refill   acetaminophen (TYLENOL) 650 MG CR tablet Take 650 mg by mouth every 8 (eight) hours as needed for pain.     amLODipine (NORVASC) 5 MG tablet 1 tab po bid 60 tablet 11   apixaban (ELIQUIS) 2.5 MG TABS tablet TAKE 1 TABLET BY MOUTH 2 TIMES A DAY 60 tablet 11   atorvastatin (LIPITOR) 20 MG tablet Take 1 tablet (20 mg total) by mouth daily. 90 tablet 3   Calcium Carbonate (CALCIUM 600 PO) Take 1 tablet by mouth daily.     Cholecalciferol (VITAMIN D) 125 MCG (5000 UT) CAPS Take 5,000 Units by mouth daily.     Cinnamon 500 MG capsule Take 1,000 mg by mouth daily.     dorzolamide-timolol (COSOPT) 2-0.5 % ophthalmic solution Place 1 drop into both eyes 2 (two) times daily.     hydrocortisone (ANUSOL-HC) 25 MG suppository Place 1 suppository (25 mg total) rectally 2 (two) times daily. (Patient taking differently: Place 25 mg rectally as needed.) 12 suppository 0   latanoprost (XALATAN) 0.005 % ophthalmic solution Place 1 drop into both eyes every evening.     levothyroxine (SYNTHROID) 50 MCG tablet TAKE 1 TABLET BY MOUTH DAILY BEFORE BREAKFAST 30 tablet 0   meclizine (ANTIVERT) 25 MG tablet Take 0.5-1 tablets (12.5-25 mg total) by mouth 3 (three) times daily as needed for dizziness. 30 tablet 0   metoprolol tartrate (LOPRESSOR) 50 MG tablet Take 1 tablet (50 mg  total) by mouth 2 (two) times daily.     Phytosterol Esters (CHOLEST CARE PO) Take 1,600 mg by mouth daily.     Probiotic Product (PROBIOTIC DAILY PO) Take 1 tablet by mouth at bedtime.     vitamin C (ASCORBIC ACID) 500 MG tablet Take 500 mg by mouth daily.     Menaquinone-7 (VITAMIN K2 PO) Take 1 tablet by mouth at bedtime. (Patient not taking: Reported on 09/16/2023)     Current Facility-Administered Medications  Medication Dose Route Frequency Provider Last Rate Last Admin   incobotulinumtoxinA (XEOMIN) 100 units injection 100 Units  100 Units Intramuscular Q90 days Levert Feinstein, MD       incobotulinumtoxinA [facility]) 100 units injection 100 Units  100 Units Intramuscular Q90 days Levert Feinstein, MD   100 Units at 05/03/22 2307    PAST MEDICAL HISTORY: Past Medical History:  Diagnosis Date   Adhesive capsulitis of left shoulder 12/22/2018   Bilateral shoulder pain 08/24/2021   Cerebrovascular accident (CVA) (HCC) 10/25/2021   Cervical dystonia 03/08/2019   Xeomin approved diagnosis.  COVID 05/29/2021   Dystonia    Edema 02/11/2019   Ganglion cyst 08/25/2020   Glaucoma    Hyperlipidemia 02/11/2019   Hypertension    Hypothyroidism (acquired) 01/19/2019   Inflammatory arthritis 08/24/2021   Mixed incontinence 03/15/2020   Formatting of this note might be different from the original.  Added automatically from request for surgery [id number]   Neck pain 01/19/2019   Post-COVID chronic fatigue 05/29/2021   Post-COVID chronic joint pain 05/29/2021   Post-COVID chronic muscle pain 05/29/2021   Primary open angle glaucoma (POAG) of both eyes, moderate stage 09/12/2021   Sedimentation rate elevation 08/24/2021   Stroke (HCC) 10/2021   Stroke (HCC) 05/2023   Syncope 10/09/2021   Thyroid disease    Type 2 diabetes mellitus (HCC) 01/19/2019   Urgency incontinence 03/15/2020   Formatting of this note might be different from the original.  Added automatically from request for surgery [id number]    Urinary frequency 08/24/2021   Vertigo 12/01/2019    PAST SURGICAL HISTORY: Past Surgical History:  Procedure Laterality Date   LOOP RECORDER INSERTION N/A 06/13/2023   Procedure: LOOP RECORDER INSERTION;  Surgeon: Regan Lemming, MD;  Location: [facility] IN[facility]ASIVE [facility];  Service: Cardiovascular;  Laterality: N/A;   NO PAST SURGERIES      FAMILY HISTORY: Family History  Problem Relation Age of Onset   Stroke Mother    Heart attack Father    Heart Problems Brother    High blood pressure Maternal Grandmother    Liver disease Neg Hx    Colon cancer Neg Hx    Esophageal cancer Neg Hx     SOCIAL HISTORY: Social History   Socioeconomic History   Marital status: Widowed    Spouse name: Not on file   Number of children: 2   Years of education: 6 or 7 years of education   Highest education level: Not on file  Occupational History   Occupation: Retired  Tobacco Use   Smoking status: Never    Passive exposure: Past   Smokeless tobacco: Never   Tobacco comments:    Never smoker 07/18/23  Vaping Use   Vaping status: Never Used  Substance and Sexual Activity   Alcohol use: No   Drug use: No   Sexual activity: Not on file  Other Topics Concern   Not on file  Social History Narrative   Lives alone.   Right-handed.   No caffeine use.   Social Drivers of Corporate investment banker Strain: Low Risk  (10/01/2021)   Overall Financial Resource Strain (CARDIA)    Difficulty of Paying Living Expenses: Not hard at all  Food Insecurity: No Food Insecurity (06/17/2023)   Hunger Vital Sign    Worried About Running Out of Food in the Last Year: Never true    Ran Out of Food in the Last Year: Never true  Transportation Needs: No Transportation Needs (06/17/2023)   PRAPARE - Administrator, Civil Service (Medical): No    Lack of Transportation (Non-Medical): No  Physical Activity: Inactive (10/01/2021)   Exercise Vital Sign    Days of Exercise per Week: 0 days    Minutes  of Exercise per Session: 0 min  Stress: No Stress Concern Present (10/01/2021)   [employer] of Occupational Health - Occupational Stress Questionnaire    Feeling of Stress : Not at all  Social Connections: Moderately Isolated (12/18/2022)   Social Connection and Isolation Panel [NHANES]    Frequency of Communication  with Friends and Family: More than three times a week    Frequency of Social Gatherings with Friends and Family: Twice a week    Attends Religious Services: More than 4 times per year    Active Member of [employer] or Organizations: No    Attends Banker Meetings: Never    Marital Status: Widowed  Intimate Partner Violence: Not At Risk (06/12/2023)   Humiliation, Afraid, Rape, and Kick questionnaire    Fear of Current or Ex-Partner: No    Emotionally Abused: No    Physically Abused: No    Sexually Abused: No      Levert Feinstein, M.D. Ph.D.  [facility] [address], [address] [place], [place] [address] Ph: [phone] Fax: [phone]  CC:  Marvel Plan, MD [address] [place],  [place] [address]  [name], PA-C

## 2023-10-21 ENCOUNTER — Ambulatory Visit (INDEPENDENT_AMBULATORY_CARE_PROVIDER_SITE_OTHER): Payer: Medicare HMO

## 2023-10-21 DIAGNOSIS — I639 Cerebral infarction, unspecified: Secondary | ICD-10-CM

## 2023-10-22 LAB — CUP PACEART REMOTE DEVICE CHECK
Date Time Interrogation Session: 20250311095716
Implantable Pulse Generator Implant Date: 20241101

## 2023-10-23 ENCOUNTER — Other Ambulatory Visit: Payer: Self-pay | Admitting: Medical

## 2023-10-23 ENCOUNTER — Ambulatory Visit: Payer: 59 | Attending: Family Medicine

## 2023-10-23 DIAGNOSIS — M25512 Pain in left shoulder: Secondary | ICD-10-CM | POA: Insufficient documentation

## 2023-10-23 DIAGNOSIS — M25511 Pain in right shoulder: Secondary | ICD-10-CM | POA: Insufficient documentation

## 2023-10-23 DIAGNOSIS — R2681 Unsteadiness on feet: Secondary | ICD-10-CM | POA: Insufficient documentation

## 2023-10-23 DIAGNOSIS — G8929 Other chronic pain: Secondary | ICD-10-CM | POA: Insufficient documentation

## 2023-10-23 DIAGNOSIS — M6281 Muscle weakness (generalized): Secondary | ICD-10-CM | POA: Insufficient documentation

## 2023-10-23 NOTE — Therapy (Signed)
 OUTPATIENT PHYSICAL THERAPY TREATMENT   Patient Name: Mackinzee Roszak MRN: [id number] DOB:1931/02/06, 89 y.o., female Today's Date: 10/23/2023  END OF SESSION:  PT End of Session - 10/23/23 1400     Visit Number 6    Date for PT Re-Evaluation 11/19/23    Authorization Type [employer] & Medicaid    Progress Note Due on Visit 10    PT Start Time 1316    PT Stop Time 1400    PT Time Calculation (min) 44 min    Activity Tolerance Patient tolerated treatment well    Behavior During Therapy [facility] for tasks assessed/performed               Past Medical History:  Diagnosis Date   Adhesive capsulitis of left shoulder 12/22/2018   Bilateral shoulder pain 08/24/2021   Cerebrovascular accident (CVA) (HCC) 10/25/2021   Cervical dystonia 03/08/2019   [employer] approved diagnosis.   COVID 05/29/2021   Dystonia    Edema 02/11/2019   Ganglion cyst 08/25/2020   Glaucoma    Hyperlipidemia 02/11/2019   Hypertension    Hypothyroidism (acquired) 01/19/2019   Inflammatory arthritis 08/24/2021   Mixed incontinence 03/15/2020   Formatting of this note might be different from the original.  Added automatically from request for surgery [id number]   Neck pain 01/19/2019   Post-COVID chronic fatigue 05/29/2021   Post-COVID chronic joint pain 05/29/2021   Post-COVID chronic muscle pain 05/29/2021   Primary open angle glaucoma (POAG) of both eyes, moderate stage 09/12/2021   Sedimentation rate elevation 08/24/2021   Stroke (HCC) 10/2021   Stroke (HCC) 05/2023   Syncope 10/09/2021   Thyroid disease    Type 2 diabetes mellitus (HCC) 01/19/2019   Urgency incontinence 03/15/2020   Formatting of this note might be different from the original.  Added automatically from request for surgery [id number]   Urinary frequency 08/24/2021   Vertigo 12/01/2019   Past Surgical History:  Procedure Laterality Date   LOOP RECORDER INSERTION N/A 06/13/2023   Procedure: LOOP RECORDER INSERTION;  Surgeon:  Regan Lemming, MD;  Location: [facility] IN[facility];  Service: Cardiovascular;  Laterality: N/A;   NO PAST SURGERIES     Patient Active Problem List   Diagnosis Date Noted   Gait abnormality 10/13/2023   Paroxysmal atrial fibrillation (HCC) 07/18/2023   Hypercoagulable state due to paroxysmal atrial fibrillation (HCC) 07/18/2023   Acute encephalopathy 06/11/2023   Osteoarthritis of right knee 12/18/2022   Glaucoma 11/26/2021   Cerebrovascular accident (CVA) (HCC) 10/25/2021   History of stroke 10/2021   Syncope 10/09/2021   Primary open angle glaucoma (POAG) of both eyes, moderate stage 09/12/2021   Sedimentation rate elevation 08/24/2021   Bilateral shoulder pain 08/24/2021   Inflammatory arthritis 08/24/2021   Urinary frequency 08/24/2021   COVID 05/29/2021   Post-COVID chronic fatigue 05/29/2021   Post-COVID chronic joint pain 05/29/2021   Post-COVID chronic muscle pain 05/29/2021   BPPV (benign paroxysmal positional vertigo), right 01/03/2021   Thyroid disease    Ganglion cyst 08/25/2020   Mixed incontinence 03/15/2020   Urgency incontinence 03/15/2020   Blepharitis of upper and lower eyelids of both eyes 01/07/2020   Dermatochalasis of both upper eyelids 01/07/2020   Hypertensive retinopathy of both eyes 01/07/2020   Macular RPE mottling 01/07/2020   Meibomian gland dysfunction (MGD) of both eyes 01/07/2020   Vitreous syneresis of both eyes 01/07/2020   Vertigo 12/01/2019   Cervical dystonia 03/08/2019   Hyperlipidemia 02/11/2019   Edema 02/11/2019  Hypertension 01/19/2019   Type 2 diabetes mellitus (HCC) 01/19/2019   Hypothyroidism (acquired) 01/19/2019   Neck pain 01/19/2019   Dystonia 01/19/2019   Adhesive capsulitis of left shoulder 12/22/2018    PCP: Marisue Brooklyn   REFERRING PROVIDER: Marisue Brooklyn   REFERRING DIAG:  M25.50 (ICD-10-CM) - Arthralgia, unspecified joint  Z91.81 (ICD-10-CM) - At risk for fall due to comorbid condition     THERAPY DIAG:  Unsteadiness on feet  Muscle weakness (generalized)  Chronic pain of both shoulders  Rationale for Evaluation and Treatment: Rehabilitation  ONSET DATE: Hospitalization on 06/11/2023  SUBJECTIVE:   SUBJECTIVE STATEMENT: 10/23/2023 she was dizzy yesterday but feels better today.  Denies pain.  She got the floor bike and has been using it at home.   From MD notes: Patient had history of chronic body pain which was managed with prednisone, but due to affect on her glaucoma, replaced by meloxicam by rheumatologist, but had to stop for ASA/plavis therapy for Afib, affecting her QOL.      PERTINENT HISTORY: AFIB, HTN, ischemic CVA, aortic atherosclerosis by CT imaging, cervical dystonia, chronic tremor, glaucoma, hypothyroidism, T2DM, and paroxysmal atrial fibrillation, Recent hospital admission 10/30-11/1 for CVA and underwent ILR placement on 06/13/23; arthritic complaints, specifically upper shoulders bilaterally   PAIN:  Are you having pain? Yes: NPRS scale: 1/10 Pain location: both shoulders, 8/10 L wrist Pain description: ache, stiff  PRECAUTIONS: Fall  Implanted loop recorder.   RED FLAGS: None   WEIGHT BEARING RESTRICTIONS: No  FALLS:  Has patient fallen in last 6 months? No  LIVING ENVIRONMENT: Lives with: lives alone sons live close  Lives in: House/apartment Stairs: No Has following equipment at home: Single point cane and Environmental consultant - 2 wheeled  OCCUPATION: retired  PLOF: Independent with basic ADLs and Independent with household mobility with device  PATIENT GOALS: improve her mobility  NEXT MD VISIT: 09/16/23 with rheumatologist  OBJECTIVE:  Note: Objective measures were completed at Evaluation unless otherwise noted.  DIAGNOSTIC FINDINGS:  06/12/23 MR Brain IMPRESSION: 1. Small acute/subacute infarcts in the left sub insular region and posterior left temporal lobe. 2. Remote infarcts in the left occipital lobe, thalami and  bilateral cerebellar hemispheres. 3. Advanced chronic microvascular ischemic changes of the white matter. 4. At least moderate spinal canal stenosis at C3-4.  PATIENT SURVEYS:  ABC scale 650/1600 = 40.6%  COGNITION: Overall cognitive status: Impaired appears to have memory deficits, asked therapists name several times throughout session.      POSTURE: rounded shoulders and forward head  PALPATION: Noted decreased joint mobility in distal radial/ulnar, carpal joints bilaterally limiting wrist ROM  UPPER EXTREMITY MMT:  MMT Right eval Left eval  Shoulder flexion 3 3  Shoulder extension    Shoulder abduction 4 4  Shoulder adduction    Shoulder extension    Shoulder internal rotation 4 4  Shoulder external rotation    Elbow flexion 5 5  Elbow extension    Wrist flexion 5 5  Grip strength good good   (Blank rows = not tested)  UPPER EXTREMITY ROM:  Active ROM Right eval Left eval  Shoulder flexion 80a 120aa 75a 120aa  Shoulder abduction 70 70   (Blank rows = not tested)  LOWER EXTREMITY MMT:  MMT Right eval Left eval  Hip flexion 3 3  Hip extension    Hip abduction 3 3  Hip adduction 3 3  Knee flexion 4 4  Knee extension 5 5  Ankle dorsiflexion  Ankle plantarflexion     (Blank rows = not tested)  FUNCTIONAL TESTS:  5 times sit to stand: 46 sec MCTSIB: Condition 1: Avg of 3 trials: 30 sec, Condition 2: Avg of 3 trials: 30 sec, Condition 3: Avg of 3 trials: 9 sec, Condition 4: Avg of 3 trials: 0 sec, and Total Score: 69/120  GAIT: Distance walked: 50 Assistive device utilized: Environmental consultant - 2 wheeled Level of assistance: Modified independence Comments: Gait speed 0.5 m/s                                                                                                                                TREATMENT DATE:  10/23/23 Gait around clincin 90 ft 2x with AD; x 2 laps n AD therapist hand assist Sit to stand x 10  Standing marching x 10 B Standing hip  abduction x 10 B Standing hip extension x 10 B Knee extension 5lb BLE x 10  Knee flexion 5lb x 10 BLE 5xSTS- 32.21 seconds  10/09/23 Therapeutic Exercise: to improve strength and mobility.  Demo, verbal and tactile cues throughout for technique. Pulleys x 4 min  Nustep L5 x 5 min  Sit to stands 2 x 10  -with bil UE support from walker: Standing marching x 20 Standing heel raises x 20 Standing hip abduction 2 x 10 bil  Standing hip extension x 10 bil  Manual Therapy: to improve mobility Joint mobs to bil wrists focusing on distal radial-ulnar joint    09/29/23 Therapeutic Exercise: to improve strength and mobility.  Demo, verbal and tactile cues throughout for technique. Pulleys x 5 min  Sit to stands x 10 min  Seated LAQ with 2# weights x 10 r/L Nustep L5 x 10 min - by request Therapeutic Activity:  to improve functional mobility and gait Gait x 167ft without AD- close SBA for safety Standing marching 2# x 30 no support, SBA for safety   09/25/23 Therapeutic Exercise: to improve strength and mobility.  Demo, verbal and tactile cues throughout for technique. Pulleys x 3 min  Sit to stands x 10  Seated LAQ with 2# weights x 10 r/L Seated marches 2# x 20 Gentle wrist stretches and circles Standing marching 2# x 30 Standing heel raises x 10 2# Manual Therapy: to decrease muscle spasm and pain and improve mobility Gentle mobs to bil wrists.  Mobility in L wrist extremely limited.   09/22/23 Therapeutic Exercise: to improve strength and mobility.  Demo, verbal and tactile cues throughout for technique. Nustep L3 x 8 min Seated table slides flexion bil x 2 min  Seated table slides scaption r/l x 2 min each Shoulder rolls retro. At counter with bil UE support and CGA for safety: Standing marching x 20 Standing heel raises x 20 Standing toe raises x 10 Standing hip abduction x 15 r/l Manual Therapy: to decrease muscle spasm and pain and improve mobility STM/TPR to bil UT,  cervical paraspinals,  pectoralis, gentle scapular mobs.    PATIENT EDUCATION:  Education details: information provided on adjustable ankle weights and arm pulleys Person educated: Patient Education method: Explanation and Handouts Education comprehension: verbalized understanding  HOME EXERCISE PROGRAM: Access Code: [email or web address] URL: [email or web address] Date: 09/22/2023 Prepared by: Harrie Foreman  Exercises - Sit to Stand with Counter Support  - 1 x daily - 7 x weekly - 2 sets - 5 reps - Seated Bilateral Shoulder Flexion Towel Slide at Table Top  - 1 x daily - 7 x weekly - 2 sets - 10 reps - Seated Shoulder Scaption Slide at Table Top with Forearm in Neutral  - 1 x daily - 7 x weekly - 2 sets - 10 reps - Heel Raises with Counter Support  - 1 x daily - 7 x weekly - 2 sets - 10 reps - Standing Hip Abduction with Counter Support  - 1 x daily - 7 x weekly - 2 sets - 10 reps - Standing March with Counter Support  - 1 x daily - 7 x weekly - 2 sets - 10 reps  Patient Education - How to Prevent Falls - Spanish  ASSESSMENT:  CLINICAL IMPRESSION: Continued with general balance and fall preventions exercises and strengthening. Good tolerance for exercises, cuing needed to stay on task. Aerianna Losey continues to demonstrate potential for improvement and would benefit from continued skilled therapy to address impairments.      OBJECTIVE IMPAIRMENTS: Abnormal gait, decreased activity tolerance, decreased balance, decreased endurance, decreased knowledge of condition, decreased mobility, difficulty walking, decreased ROM, decreased strength, decreased safety awareness, hypomobility, increased fascial restrictions, impaired perceived functional ability, increased muscle spasms, impaired flexibility, impaired UE functional use, and pain.   ACTIVITY LIMITATIONS: carrying, lifting, bending, standing, squatting, transfers, dressing, reach over head, hygiene/grooming, and locomotion  level  PARTICIPATION LIMITATIONS: meal prep, cleaning, laundry, and community activity  PERSONAL FACTORS: Age, Fitness, Past/current experiences, Time since onset of injury/illness/exacerbation, and 3+ comorbidities: AFIB, HTN, ischemic CVA, aortic atherosclerosis by CT imaging, cervical dystonia, chronic tremor, glaucoma, hypothyroidism, T2DM, and paroxysmal atrial fibrillation, Recent hospital admission 10/30-11/1 for CVA and underwent ILR placement on 06/13/23; arthritic complaints, specifically upper shoulders bilaterally   are also affecting patient's functional outcome.   REHAB POTENTIAL: Good  CLINICAL DECISION MAKING: Evolving/moderate complexity  EVALUATION COMPLEXITY: Moderate   GOALS: Goals reviewed with patient? Yes  SHORT TERM GOALS: Target date: 09/24/2023   Patient will be independent with initial HEP. Baseline: given Goal status: MET 09/25/23 reports compliance with table slides and sit to stands, does not remember updates.  09/29/23- MET  2.  Patient will be educated on strategies to decrease risk of falls.  Baseline:  Goal status: MET 09/23/23 Handout provided.    LONG TERM GOALS: Target date: 11/19/2023   Patient will be independent with advanced/ongoing HEP to improve outcomes and carryover.  Baseline:  Goal status: IN PROGRESS  2.  Patient will be able to ambulate 600' with LRAD with good safety to access community.  Baseline:  Goal status: IN PROGRESS  3. Patient will demonstrate improved functional LE strength as demonstrated by 5x STS < 30 seconds. Baseline: 46 seconds Goal status: IN PROGRESS  4.  Patient will demonstrate > 19/28 on Tinetti to improve gait stability and reduce risk for falls. Baseline: NT Goal status: IN PROGRESS  5.  Patient will report 50% improvement in bil shoulder mobility to improve ease of dressing and ADLs.  Baseline:  Goal status: IN PROGRESS 10/09/23 - reports no pain  6.  Patient will report 14 points improvement on ABC  scale to demonstrate improved functional ability. Baseline: 40.6% confidence Goal status: IN PROGRESS  7.  Patient will demonstrate gait speed of >0.55 m/s) to be a safe limited community ambulator with decreased risk for recurrent falls.  Baseline: 0.5 m/s Goal status: IN PROGRESS   PLAN:  PT FREQUENCY: 1-2x/week  PT DURATION: 10 weeks  PLANNED INTERVENTIONS: 97110-Therapeutic exercises, 97530- Therapeutic activity, 97112- Neuromuscular re-education, 97535- Self Care, 16109- Manual therapy, [phone]- Gait training, [phone]- Ultrasound, Patient/Family education, Balance training, Stair training, Taping, Dry Needling, Joint mobilization, Joint manipulation, Spinal manipulation, Spinal mobilization, Cryotherapy, and Moist heat  PLAN FOR NEXT SESSION:  postural strengthening, focus on OTAGOs exercises - doing medbridge so exercise instructions in spanish, review fall prevention handout, Modalities PRN.   Has implanted loop recorder.    Darleene Cleaver, PTA 10/23/2023, 2:01 PM

## 2023-10-23 NOTE — Addendum Note (Signed)
 Addended by: Geralyn Flash D on: 10/23/2023 12:47 PM   Modules accepted: Orders

## 2023-10-23 NOTE — Progress Notes (Signed)
 [facility] Summary Report / Loop Recorder

## 2023-10-27 ENCOUNTER — Encounter: Payer: Self-pay | Admitting: Physical Therapy

## 2023-10-27 ENCOUNTER — Ambulatory Visit: Payer: 59 | Admitting: Physical Therapy

## 2023-10-27 DIAGNOSIS — G8929 Other chronic pain: Secondary | ICD-10-CM

## 2023-10-27 DIAGNOSIS — R2681 Unsteadiness on feet: Secondary | ICD-10-CM

## 2023-10-27 DIAGNOSIS — M25512 Pain in left shoulder: Secondary | ICD-10-CM | POA: Diagnosis not present

## 2023-10-27 DIAGNOSIS — M6281 Muscle weakness (generalized): Secondary | ICD-10-CM

## 2023-10-27 DIAGNOSIS — M25511 Pain in right shoulder: Secondary | ICD-10-CM | POA: Diagnosis not present

## 2023-10-27 NOTE — Therapy (Signed)
 OUTPATIENT PHYSICAL THERAPY TREATMENT   Patient Name: Leslie Ward MRN: [id number] DOB:03/25/1931, 89 y.o., female Today's Date: 10/27/2023  END OF SESSION:  PT End of Session - 10/27/23 1156     Visit Number 7    Date for PT Re-Evaluation 11/19/23    Authorization Type [employer] & Medicaid    Progress Note Due on Visit 10    PT Start Time 1152    Activity Tolerance Patient tolerated treatment well    Behavior During Therapy [facility] for tasks assessed/performed               Past Medical History:  Diagnosis Date   Adhesive capsulitis of left shoulder 12/22/2018   Bilateral shoulder pain 08/24/2021   Cerebrovascular accident (CVA) (HCC) 10/25/2021   Cervical dystonia 03/08/2019   [employer] approved diagnosis.   COVID 05/29/2021   Dystonia    Edema 02/11/2019   Ganglion cyst 08/25/2020   Glaucoma    Hyperlipidemia 02/11/2019   Hypertension    Hypothyroidism (acquired) 01/19/2019   Inflammatory arthritis 08/24/2021   Mixed incontinence 03/15/2020   Formatting of this note might be different from the original.  Added automatically from request for surgery [id number]   Neck pain 01/19/2019   Post-COVID chronic fatigue 05/29/2021   Post-COVID chronic joint pain 05/29/2021   Post-COVID chronic muscle pain 05/29/2021   Primary open angle glaucoma (POAG) of both eyes, moderate stage 09/12/2021   Sedimentation rate elevation 08/24/2021   Stroke (HCC) 10/2021   Stroke (HCC) 05/2023   Syncope 10/09/2021   Thyroid disease    Type 2 diabetes mellitus (HCC) 01/19/2019   Urgency incontinence 03/15/2020   Formatting of this note might be different from the original.  Added automatically from request for surgery [id number]   Urinary frequency 08/24/2021   Vertigo 12/01/2019   Past Surgical History:  Procedure Laterality Date   LOOP RECORDER INSERTION N/A 06/13/2023   Procedure: LOOP RECORDER INSERTION;  Surgeon: Regan Lemming, MD;  Location: [facility];   Service: Cardiovascular;  Laterality: N/A;   NO PAST SURGERIES     Patient Active Problem List   Diagnosis Date Noted   Gait abnormality 10/13/2023   Paroxysmal atrial fibrillation (HCC) 07/18/2023   Hypercoagulable state due to paroxysmal atrial fibrillation (HCC) 07/18/2023   Acute encephalopathy 06/11/2023   Osteoarthritis of right knee 12/18/2022   Glaucoma 11/26/2021   Cerebrovascular accident (CVA) (HCC) 10/25/2021   History of stroke 10/2021   Syncope 10/09/2021   Primary open angle glaucoma (POAG) of both eyes, moderate stage 09/12/2021   Sedimentation rate elevation 08/24/2021   Bilateral shoulder pain 08/24/2021   Inflammatory arthritis 08/24/2021   Urinary frequency 08/24/2021   COVID 05/29/2021   Post-COVID chronic fatigue 05/29/2021   Post-COVID chronic joint pain 05/29/2021   Post-COVID chronic muscle pain 05/29/2021   BPPV (benign paroxysmal positional vertigo), right 01/03/2021   Thyroid disease    Ganglion cyst 08/25/2020   Mixed incontinence 03/15/2020   Urgency incontinence 03/15/2020   Blepharitis of upper and lower eyelids of both eyes 01/07/2020   Dermatochalasis of both upper eyelids 01/07/2020   Hypertensive retinopathy of both eyes 01/07/2020   Macular RPE mottling 01/07/2020   Meibomian gland dysfunction (MGD) of both eyes 01/07/2020   Vitreous syneresis of both eyes 01/07/2020   Vertigo 12/01/2019   Cervical dystonia 03/08/2019   Hyperlipidemia 02/11/2019   Edema 02/11/2019   Hypertension 01/19/2019   Type 2 diabetes mellitus (HCC) 01/19/2019   Hypothyroidism (acquired) 01/19/2019  Neck pain 01/19/2019   Dystonia 01/19/2019   Adhesive capsulitis of left shoulder 12/22/2018    PCP: [name], PA-C   REFERRING PROVIDER: Zola Button, Grayling Congress, *   REFERRING DIAG:  M25.50 (ICD-10-CM) - Arthralgia, unspecified joint  Z91.81 (ICD-10-CM) - At risk for fall due to comorbid condition    THERAPY DIAG:  Unsteadiness on feet  Muscle  weakness (generalized)  Chronic pain of both shoulders  Rationale for Evaluation and Treatment: Rehabilitation  ONSET DATE: Hospitalization on 06/11/2023  SUBJECTIVE:   SUBJECTIVE STATEMENT: 10/27/2023 shoulders are doing well, doesn't understand why she can't walk.   From MD notes: Patient had history of chronic body pain which was managed with prednisone, but due to affect on her glaucoma, replaced by meloxicam by rheumatologist, but had to stop for ASA/plavis therapy for Afib, affecting her QOL.      PERTINENT HISTORY: AFIB, HTN, ischemic CVA, aortic atherosclerosis by CT imaging, cervical dystonia, chronic tremor, glaucoma, hypothyroidism, T2DM, and paroxysmal atrial fibrillation, Recent hospital admission 10/30-11/1 for CVA and underwent ILR placement on 06/13/23; arthritic complaints, specifically upper shoulders bilaterally   PAIN:  Are you having pain? Yes: NPRS scale: 1/10 Pain location: both shoulders, 8/10 L wrist Pain description: ache, stiff  PRECAUTIONS: Fall  Implanted loop recorder.   RED FLAGS: None   WEIGHT BEARING RESTRICTIONS: No  FALLS:  Has patient fallen in last 6 months? No  LIVING ENVIRONMENT: Lives with: lives alone sons live close  Lives in: House/apartment Stairs: No Has following equipment at home: Single point cane and Environmental consultant - 2 wheeled  OCCUPATION: retired  PLOF: Independent with basic ADLs and Independent with household mobility with device  PATIENT GOALS: improve her mobility  NEXT MD VISIT: 09/16/23 with rheumatologist  OBJECTIVE:  Note: Objective measures were completed at Evaluation unless otherwise noted.  DIAGNOSTIC FINDINGS:  06/12/23 MR Brain IMPRESSION: 1. Small acute/subacute infarcts in the left sub insular region and posterior left temporal lobe. 2. Remote infarcts in the left occipital lobe, thalami and bilateral cerebellar hemispheres. 3. Advanced chronic microvascular ischemic changes of the white matter. 4. At least  moderate spinal canal stenosis at C3-4.  PATIENT SURVEYS:  ABC scale 650/1600 = 40.6%  COGNITION: Overall cognitive status: Impaired appears to have memory deficits, asked therapists name several times throughout session.      POSTURE: rounded shoulders and forward head  PALPATION: Noted decreased joint mobility in distal radial/ulnar, carpal joints bilaterally limiting wrist ROM  UPPER EXTREMITY MMT:  MMT Right eval Left eval  Shoulder flexion 3 3  Shoulder extension    Shoulder abduction 4 4  Shoulder adduction    Shoulder extension    Shoulder internal rotation 4 4  Shoulder external rotation    Elbow flexion 5 5  Elbow extension    Wrist flexion 5 5  Grip strength good good   (Blank rows = not tested)  UPPER EXTREMITY ROM:  Active ROM Right eval Left eval  Shoulder flexion 80a 120aa 75a 120aa  Shoulder abduction 70 70   (Blank rows = not tested)  LOWER EXTREMITY MMT:  MMT Right eval Left eval  Hip flexion 3 3  Hip extension    Hip abduction 3 3  Hip adduction 3 3  Knee flexion 4 4  Knee extension 5 5  Ankle dorsiflexion    Ankle plantarflexion     (Blank rows = not tested)  FUNCTIONAL TESTS:  5 times sit to stand: 46 sec MCTSIB: Condition 1: Avg of 3  trials: 30 sec, Condition 2: Avg of 3 trials: 30 sec, Condition 3: Avg of 3 trials: 9 sec, Condition 4: Avg of 3 trials: 0 sec, and Total Score: 69/120  GAIT: Distance walked: 50 Assistive device utilized: Walker - 2 wheeled Level of assistance: Modified independence Comments: Gait speed 0.5 m/s                                                                                                                                TREATMENT DATE:  10/27/23 Therapeutic Exercise: to improve strength and mobility.  Demo, verbal and tactile cues throughout for technique. Pulleys x 5 min  Knee extension 5# BLE x 20 Hamstring curls 5# BLE x 20   Therapeutic Activity:  to improve gait and balance Gait x 200'  with [facility] - noted frequently drifting and catching cane on items on right, cues needed.  Walking with SBQC around cones - gradually moving cones closer, again knocking cones over on right Stepping over obstacles - using small ankle and hand weights for obstacles and quad cane, no LOB, caught R foot x 1 on larger weight   10/23/23 Gait around clincin 90 ft 2x with AD; x 2 laps n AD therapist hand assist Sit to stand x 10  Standing marching x 10 B Standing hip abduction x 10 B Standing hip extension x 10 B Knee extension 5lb BLE x 10  Knee flexion 5lb x 10 BLE 5xSTS- 32.21 seconds  10/09/23 Therapeutic Exercise: to improve strength and mobility.  Demo, verbal and tactile cues throughout for technique. Pulleys x 4 min  Nustep L5 x 5 min  Sit to stands 2 x 10  -with bil UE support from walker: Standing marching x 20 Standing heel raises x 20 Standing hip abduction 2 x 10 bil  Standing hip extension x 10 bil  Manual Therapy: to improve mobility Joint mobs to bil wrists focusing on distal radial-ulnar joint    PATIENT EDUCATION:  Education details: information provided on adjustable ankle weights and arm pulleys Person educated: Patient Education method: Explanation and Handouts Education comprehension: verbalized understanding  HOME EXERCISE PROGRAM: Access Code: [email or web address] URL: [email or web address] Date: 09/22/2023 Prepared by: Harrie Foreman  Exercises - Sit to Stand with Counter Support  - 1 x daily - 7 x weekly - 2 sets - 5 reps - Seated Bilateral Shoulder Flexion Towel Slide at Table Top  - 1 x daily - 7 x weekly - 2 sets - 10 reps - Seated Shoulder Scaption Slide at Table Top with Forearm in Neutral  - 1 x daily - 7 x weekly - 2 sets - 10 reps - Heel Raises with Counter Support  - 1 x daily - 7 x weekly - 2 sets - 10 reps - Standing Hip Abduction with Counter Support  - 1 x daily - 7 x weekly - 2 sets - 10 reps - Standing March with Counter Support  - 1 x  daily - 7 x weekly - 2 sets - 10 reps  Patient Education - How to Prevent Falls - Spanish  ASSESSMENT:  CLINICAL IMPRESSION: Leslie Ward reports good compliance with HEP, using ankle weights and floor bike.  Today focused on using quad cane, she did not really using it for support, but noted she tended not to correct self when walking too close to furniture/obstacles on R side, when checking peripheral vision 3 attempts needed before she indicated that she could see movement in peripheraly, so there may be some visual deficits/neglect.  She had less difficulty stepping over obstacles in front of her path.  Leslie Ward continues to demonstrate potential for improvement and would benefit from continued skilled therapy to address impairments.      OBJECTIVE IMPAIRMENTS: Abnormal gait, decreased activity tolerance, decreased balance, decreased endurance, decreased knowledge of condition, decreased mobility, difficulty walking, decreased ROM, decreased strength, decreased safety awareness, hypomobility, increased fascial restrictions, impaired perceived functional ability, increased muscle spasms, impaired flexibility, impaired UE functional use, and pain.   ACTIVITY LIMITATIONS: carrying, lifting, bending, standing, squatting, transfers, dressing, reach over head, hygiene/grooming, and locomotion level  PARTICIPATION LIMITATIONS: meal prep, cleaning, laundry, and community activity  PERSONAL FACTORS: Age, Fitness, Past/current experiences, Time since onset of injury/illness/exacerbation, and 3+ comorbidities: AFIB, HTN, ischemic CVA, aortic atherosclerosis by CT imaging, cervical dystonia, chronic tremor, glaucoma, hypothyroidism, T2DM, and paroxysmal atrial fibrillation, Recent hospital admission 10/30-11/1 for CVA and underwent ILR placement on 06/13/23; arthritic complaints, specifically upper shoulders bilaterally   are also affecting patient's functional outcome.   REHAB POTENTIAL:  Good  CLINICAL DECISION MAKING: Evolving/moderate complexity  EVALUATION COMPLEXITY: Moderate   GOALS: Goals reviewed with patient? Yes  SHORT TERM GOALS: Target date: 09/24/2023   Patient will be independent with initial HEP. Baseline: given Goal status: MET 09/25/23 reports compliance with table slides and sit to stands, does not remember updates.  09/29/23- MET  2.  Patient will be educated on strategies to decrease risk of falls.  Baseline:  Goal status: MET 09/23/23 Handout provided.    LONG TERM GOALS: Target date: 11/19/2023   Patient will be independent with advanced/ongoing HEP to improve outcomes and carryover.  Baseline:  Goal status: IN PROGRESS  2.  Patient will be able to ambulate 600' with LRAD with good safety to access community.  Baseline:  Goal status: IN PROGRESS  3. Patient will demonstrate improved functional LE strength as demonstrated by 5x STS < 30 seconds. Baseline: 46 seconds Goal status: IN PROGRESS  4.  Patient will demonstrate > 19/28 on Tinetti to improve gait stability and reduce risk for falls. Baseline: NT Goal status: IN PROGRESS  5.  Patient will report 50% improvement in bil shoulder mobility to improve ease of dressing and ADLs.  Baseline:  Goal status: IN PROGRESS 10/09/23 - reports no pain  6.  Patient will report 14 points improvement on ABC scale to demonstrate improved functional ability. Baseline: 40.6% confidence Goal status: IN PROGRESS  7.  Patient will demonstrate gait speed of >0.55 m/s) to be a safe limited community ambulator with decreased risk for recurrent falls.  Baseline: 0.5 m/s Goal status: IN PROGRESS   PLAN:  PT FREQUENCY: 1-2x/week  PT DURATION: 10 weeks  PLANNED INTERVENTIONS: 97110-Therapeutic exercises, 97530- Therapeutic activity, 97112- Neuromuscular re-education, 97535- Self Care, 40102- Manual therapy, [phone]- Gait training, [phone]- Ultrasound, Patient/Family education, Balance training, Stair  training, Taping, Dry Needling, Joint mobilization, Joint manipulation, Spinal manipulation, Spinal mobilization, Cryotherapy, and Moist heat  PLAN  FOR NEXT SESSION:  postural strengthening, focus on OTAGOs exercises - doing medbridge so exercise instructions in spanish, review fall prevention handout, Modalities PRN.   Has implanted loop recorder.    Jena Gauss, PT 10/27/2023, 11:56 AM

## 2023-10-30 ENCOUNTER — Ambulatory Visit: Payer: 59

## 2023-11-03 ENCOUNTER — Encounter: Payer: Self-pay | Admitting: Physical Therapy

## 2023-11-03 ENCOUNTER — Ambulatory Visit: Payer: 59 | Admitting: Physical Therapy

## 2023-11-03 DIAGNOSIS — M6281 Muscle weakness (generalized): Secondary | ICD-10-CM | POA: Diagnosis not present

## 2023-11-03 DIAGNOSIS — G8929 Other chronic pain: Secondary | ICD-10-CM | POA: Diagnosis not present

## 2023-11-03 DIAGNOSIS — R2681 Unsteadiness on feet: Secondary | ICD-10-CM

## 2023-11-03 DIAGNOSIS — M25511 Pain in right shoulder: Secondary | ICD-10-CM | POA: Diagnosis not present

## 2023-11-03 DIAGNOSIS — M25512 Pain in left shoulder: Secondary | ICD-10-CM | POA: Diagnosis not present

## 2023-11-03 NOTE — Therapy (Signed)
 OUTPATIENT PHYSICAL THERAPY TREATMENT   Patient Name: Leslie Ward MRN: [id number] DOB:12-28-1930, 89 y.o., female Today's Date: 11/03/2023  END OF SESSION:  PT End of Session - 11/03/23 1110     Visit Number 8    Date for PT Re-Evaluation 11/19/23    Authorization Type [employer] & Medicaid    Progress Note Due on Visit 10    PT Start Time 1106    PT Stop Time 1146    PT Time Calculation (min) 40 min    Activity Tolerance Patient tolerated treatment well    Behavior During Therapy [facility] for tasks assessed/performed               Past Medical History:  Diagnosis Date   Adhesive capsulitis of left shoulder 12/22/2018   Bilateral shoulder pain 08/24/2021   Cerebrovascular accident (CVA) (HCC) 10/25/2021   Cervical dystonia 03/08/2019   [employer] approved diagnosis.   COVID 05/29/2021   Dystonia    Edema 02/11/2019   Ganglion cyst 08/25/2020   Glaucoma    Hyperlipidemia 02/11/2019   Hypertension    Hypothyroidism (acquired) 01/19/2019   Inflammatory arthritis 08/24/2021   Mixed incontinence 03/15/2020   Formatting of this note might be different from the original.  Added automatically from request for surgery [id number]   Neck pain 01/19/2019   Post-COVID chronic fatigue 05/29/2021   Post-COVID chronic joint pain 05/29/2021   Post-COVID chronic muscle pain 05/29/2021   Primary open angle glaucoma (POAG) of both eyes, moderate stage 09/12/2021   Sedimentation rate elevation 08/24/2021   Stroke (HCC) 10/2021   Stroke (HCC) 05/2023   Syncope 10/09/2021   Thyroid disease    Type 2 diabetes mellitus (HCC) 01/19/2019   Urgency incontinence 03/15/2020   Formatting of this note might be different from the original.  Added automatically from request for surgery [id number]   Urinary frequency 08/24/2021   Vertigo 12/01/2019   Past Surgical History:  Procedure Laterality Date   LOOP RECORDER INSERTION N/A 06/13/2023   Procedure: LOOP RECORDER INSERTION;  Surgeon:  Regan Lemming, MD;  Location: [facility] INVASIVE CV LAB;  Service: Cardiovascular;  Laterality: N/A;   NO PAST SURGERIES     Patient Active Problem List   Diagnosis Date Noted   Gait abnormality 10/13/2023   Paroxysmal atrial fibrillation (HCC) 07/18/2023   Hypercoagulable state due to paroxysmal atrial fibrillation (HCC) 07/18/2023   Acute encephalopathy 06/11/2023   Osteoarthritis of right knee 12/18/2022   Glaucoma 11/26/2021   Cerebrovascular accident (CVA) (HCC) 10/25/2021   History of stroke 10/2021   Syncope 10/09/2021   Primary open angle glaucoma (POAG) of both eyes, moderate stage 09/12/2021   Sedimentation rate elevation 08/24/2021   Bilateral shoulder pain 08/24/2021   Inflammatory arthritis 08/24/2021   Urinary frequency 08/24/2021   COVID 05/29/2021   Post-COVID chronic fatigue 05/29/2021   Post-COVID chronic joint pain 05/29/2021   Post-COVID chronic muscle pain 05/29/2021   BPPV (benign paroxysmal positional vertigo), right 01/03/2021   Thyroid disease    Ganglion cyst 08/25/2020   Mixed incontinence 03/15/2020   Urgency incontinence 03/15/2020   Blepharitis of upper and lower eyelids of both eyes 01/07/2020   Dermatochalasis of both upper eyelids 01/07/2020   Hypertensive retinopathy of both eyes 01/07/2020   Macular RPE mottling 01/07/2020   Meibomian gland dysfunction (MGD) of both eyes 01/07/2020   Vitreous syneresis of both eyes 01/07/2020   Vertigo 12/01/2019   Cervical dystonia 03/08/2019   Hyperlipidemia 02/11/2019   Edema 02/11/2019  Hypertension 01/19/2019   Type 2 diabetes mellitus (HCC) 01/19/2019   Hypothyroidism (acquired) 01/19/2019   Neck pain 01/19/2019   Dystonia 01/19/2019   Adhesive capsulitis of left shoulder 12/22/2018    PCP: [name], PA-C   REFERRING PROVIDER: Zola Button, Grayling Congress, *   REFERRING DIAG:  M25.50 (ICD-10-CM) - Arthralgia, unspecified joint  Z91.81 (ICD-10-CM) - At risk for fall due to comorbid  condition    THERAPY DIAG:  Unsteadiness on feet  Muscle weakness (generalized)  Chronic pain of both shoulders  Rationale for Evaluation and Treatment: Rehabilitation  ONSET DATE: Hospitalization on 06/11/2023  SUBJECTIVE:   SUBJECTIVE STATEMENT: 11/03/2023 Doing well, can do everything for herself, no falls, still feels more secure with walker.   From MD notes: Patient had history of chronic body pain which was managed with prednisone, but due to affect on her glaucoma, replaced by meloxicam by rheumatologist, but had to stop for ASA/plavis therapy for Afib, affecting her QOL.      PERTINENT HISTORY: AFIB, HTN, ischemic CVA, aortic atherosclerosis by CT imaging, cervical dystonia, chronic tremor, glaucoma, hypothyroidism, T2DM, and paroxysmal atrial fibrillation, Recent hospital admission 10/30-11/1 for CVA and underwent ILR placement on 06/13/23; arthritic complaints, specifically upper shoulders bilaterally   PAIN:  Are you having pain? No  PRECAUTIONS: Fall  Implanted loop recorder.   RED FLAGS: None   WEIGHT BEARING RESTRICTIONS: No  FALLS:  Has patient fallen in last 6 months? No  LIVING ENVIRONMENT: Lives with: lives alone sons live close  Lives in: House/apartment Stairs: No Has following equipment at home: Single point cane and Environmental consultant - 2 wheeled  OCCUPATION: retired  PLOF: Independent with basic ADLs and Independent with household mobility with device  PATIENT GOALS: improve her mobility  NEXT MD VISIT: 09/16/23 with rheumatologist  OBJECTIVE:  Note: Objective measures were completed at Evaluation unless otherwise noted.  DIAGNOSTIC FINDINGS:  06/12/23 MR Brain IMPRESSION: 1. Small acute/subacute infarcts in the left sub insular region and posterior left temporal lobe. 2. Remote infarcts in the left occipital lobe, thalami and bilateral cerebellar hemispheres. 3. Advanced chronic microvascular ischemic changes of the white matter. 4. At least  moderate spinal canal stenosis at C3-4.  PATIENT SURVEYS:  ABC scale 650/1600 = 40.6%  COGNITION: Overall cognitive status: Impaired appears to have memory deficits, asked therapists name several times throughout session.      POSTURE: rounded shoulders and forward head  PALPATION: Noted decreased joint mobility in distal radial/ulnar, carpal joints bilaterally limiting wrist ROM  UPPER EXTREMITY MMT:  MMT Right eval Left eval  Shoulder flexion 3 3  Shoulder extension    Shoulder abduction 4 4  Shoulder adduction    Shoulder extension    Shoulder internal rotation 4 4  Shoulder external rotation    Elbow flexion 5 5  Elbow extension    Wrist flexion 5 5  Grip strength good good   (Blank rows = not tested)  UPPER EXTREMITY ROM:  Active ROM Right eval Left eval  Shoulder flexion 80a 120aa 75a 120aa  Shoulder abduction 70 70   (Blank rows = not tested)  LOWER EXTREMITY MMT:  MMT Right eval Left eval  Hip flexion 3 3  Hip extension    Hip abduction 3 3  Hip adduction 3 3  Knee flexion 4 4  Knee extension 5 5  Ankle dorsiflexion    Ankle plantarflexion     (Blank rows = not tested)  FUNCTIONAL TESTS:  5 times sit to stand:  46 sec MCTSIB: Condition 1: Avg of 3 trials: 30 sec, Condition 2: Avg of 3 trials: 30 sec, Condition 3: Avg of 3 trials: 9 sec, Condition 4: Avg of 3 trials: 0 sec, and Total Score: 69/120  GAIT: Distance walked: 50 Assistive device utilized: Environmental consultant - 2 wheeled Level of assistance: Modified independence Comments: Gait speed 0.5 m/s                                                                                                                                TREATMENT DATE:   11/03/23 Therapeutic Activity:  to improve activity tolerance, mobility  Nustep L4  x 6 min  Walking with SBQC around obstacle course incuding cones and weights to stop over and around  - no LOB or instability, hitting cones with R foot occastionally, CGA for  safety Side stepping over ankle weights on floor without AD - needed occasional HHA, occasional small LOB with only minA needed Forward stepping over ankle weights without AD -  occasional minA needed for small LOB.    10/27/23 Therapeutic Exercise: to improve strength and mobility.  Demo, verbal and tactile cues throughout for technique. Pulleys x 5 min  Knee extension 5# BLE x 20 Hamstring curls 5# BLE x 20   Therapeutic Activity:  to improve gait and balance Gait x 200' with [facility] - noted frequently drifting and catching cane on items on right, cues needed.  Walking with SBQC around cones - gradually moving cones closer, again knocking cones over on right Stepping over obstacles - using small ankle and hand weights for obstacles and quad cane, no LOB, caught R foot x 1 on larger weight   10/23/23 Gait around clincin 90 ft 2x with AD; x 2 laps n AD therapist hand assist Sit to stand x 10  Standing marching x 10 B Standing hip abduction x 10 B Standing hip extension x 10 B Knee extension 5lb BLE x 10  Knee flexion 5lb x 10 BLE 5xSTS- 32.21 seconds  10/09/23 Therapeutic Exercise: to improve strength and mobility.  Demo, verbal and tactile cues throughout for technique. Pulleys x 4 min  Nustep L5 x 5 min  Sit to stands 2 x 10  -with bil UE support from walker: Standing marching x 20 Standing heel raises x 20 Standing hip abduction 2 x 10 bil  Standing hip extension x 10 bil  Manual Therapy: to improve mobility Joint mobs to bil wrists focusing on distal radial-ulnar joint    PATIENT EDUCATION:  Education details: information provided on adjustable ankle weights and arm pulleys Person educated: Patient Education method: Explanation and Handouts Education comprehension: verbalized understanding  HOME EXERCISE PROGRAM: Access Code: [email or web address] URL: [email or web address] Date: 09/22/2023 Prepared by: Harrie Foreman  Exercises - Sit to Stand with Counter  Support  - 1 x daily - 7 x weekly - 2 sets - 5 reps - Seated Bilateral Shoulder Flexion Towel Slide at Table Top  -  1 x daily - 7 x weekly - 2 sets - 10 reps - Seated Shoulder Scaption Slide at Table Top with Forearm in Neutral  - 1 x daily - 7 x weekly - 2 sets - 10 reps - Heel Raises with Counter Support  - 1 x daily - 7 x weekly - 2 sets - 10 reps - Standing Hip Abduction with Counter Support  - 1 x daily - 7 x weekly - 2 sets - 10 reps - Standing March with Counter Support  - 1 x daily - 7 x weekly - 2 sets - 10 reps  Patient Education - How to Prevent Falls - Spanish  ASSESSMENT:  CLINICAL IMPRESSION: Jalicia Roszak is demonstrating good stability, she is frequently stepping away from her walker to take of her jacket, etc here at the clinic, so today working more without AD.  Even with VC still hit cones with Right foot, and was very anxious with side stepping without AD today.  Since she feels much more secure with the walker, recommended she continue to use it.  Her 5x STS time has improved significantly from 46 sec to 23 sec (and she got distracted and started talking during the test today), meeting LTG # 5 and her shoulder pain has not bothered her in weeks, meeting LTG #3. Kyilee Gregg continues to demonstrate potential for improvement and would benefit from continued skilled therapy to address impairments.      OBJECTIVE IMPAIRMENTS: Abnormal gait, decreased activity tolerance, decreased balance, decreased endurance, decreased knowledge of condition, decreased mobility, difficulty walking, decreased ROM, decreased strength, decreased safety awareness, hypomobility, increased fascial restrictions, impaired perceived functional ability, increased muscle spasms, impaired flexibility, impaired UE functional use, and pain.   ACTIVITY LIMITATIONS: carrying, lifting, bending, standing, squatting, transfers, dressing, reach over head, hygiene/grooming, and locomotion  level  PARTICIPATION LIMITATIONS: meal prep, cleaning, laundry, and community activity  PERSONAL FACTORS: Age, Fitness, Past/current experiences, Time since onset of injury/illness/exacerbation, and 3+ comorbidities: AFIB, HTN, ischemic CVA, aortic atherosclerosis by CT imaging, cervical dystonia, chronic tremor, glaucoma, hypothyroidism, T2DM, and paroxysmal atrial fibrillation, Recent hospital admission 10/30-11/1 for CVA and underwent ILR placement on 06/13/23; arthritic complaints, specifically upper shoulders bilaterally   are also affecting patient's functional outcome.   REHAB POTENTIAL: Good  CLINICAL DECISION MAKING: Evolving/moderate complexity  EVALUATION COMPLEXITY: Moderate   GOALS: Goals reviewed with patient? Yes  SHORT TERM GOALS: Target date: 09/24/2023   Patient will be independent with initial HEP. Baseline: given Goal status: MET 09/25/23 reports compliance with table slides and sit to stands, does not remember updates.  09/29/23- MET  2.  Patient will be educated on strategies to decrease risk of falls.  Baseline:  Goal status: MET 09/23/23 Handout provided.    LONG TERM GOALS: Target date: 11/19/2023   Patient will be independent with advanced/ongoing HEP to improve outcomes and carryover.  Baseline:  Goal status: IN PROGRESS 11/03/23- met for current, also using floor bike at home and has ankle weights now too  2.  Patient will be able to ambulate 600' with LRAD with good safety to access community.  Baseline:  Goal status: IN PROGRESS 11/03/23 - ambulated 200' with [facility] but still fatiques quickly and difficulty clearing objects on Right  3. Patient will demonstrate improved functional LE strength as demonstrated by 5x STS < 30 seconds. Baseline: 46 seconds Goal status: IN PROGRESS 11/03/23 23 seconds   4.  Patient will demonstrate > 19/28 on Tinetti to improve gait stability and reduce  risk for falls. Baseline: NT Goal status: IN PROGRESS  5.  Patient will  report 50% improvement in bil shoulder mobility to improve ease of dressing and ADLs.  Baseline:  Goal status: MET 10/09/23 - reports no pain 11/03/23 no pain in shoulders.   6.  Patient will report 14 points improvement on ABC scale to demonstrate improved functional ability. Baseline: 40.6% confidence Goal status: IN PROGRESS  7.  Patient will demonstrate gait speed of >0.55 m/s) to be a safe limited community ambulator with decreased risk for recurrent falls.  Baseline: 0.5 m/s Goal status: IN PROGRESS   PLAN:  PT FREQUENCY: 1-2x/week  PT DURATION: 10 weeks  PLANNED INTERVENTIONS: 97110-Therapeutic exercises, 97530- Therapeutic activity, 97112- Neuromuscular re-education, 97535- Self Care, [id number]- Manual therapy, [phone]- Gait training, [phone]- Ultrasound, Patient/Family education, Balance training, Stair training, Taping, Dry Needling, Joint mobilization, Joint manipulation, Spinal manipulation, Spinal mobilization, Cryotherapy, and Moist heat  PLAN FOR NEXT SESSION:  TINETTI & Gait speed goals, continue LE strengthening, plan to d/c at end of POC.    Jena Gauss, PT 11/03/2023, 12:16 PM

## 2023-11-06 ENCOUNTER — Ambulatory Visit: Payer: 59

## 2023-11-06 DIAGNOSIS — M6281 Muscle weakness (generalized): Secondary | ICD-10-CM | POA: Diagnosis not present

## 2023-11-06 DIAGNOSIS — G8929 Other chronic pain: Secondary | ICD-10-CM

## 2023-11-06 DIAGNOSIS — R2681 Unsteadiness on feet: Secondary | ICD-10-CM

## 2023-11-06 DIAGNOSIS — M25511 Pain in right shoulder: Secondary | ICD-10-CM | POA: Diagnosis not present

## 2023-11-06 DIAGNOSIS — M25512 Pain in left shoulder: Secondary | ICD-10-CM | POA: Diagnosis not present

## 2023-11-06 NOTE — Therapy (Signed)
 OUTPATIENT PHYSICAL THERAPY TREATMENT   Patient Name: Leslie Ward MRN: [id number] DOB:[birth year], 89 y.o., female Today's Date: 11/06/2023  END OF SESSION:  PT End of Session - 11/06/23 1113     Visit Number 9    Date for PT Re-Evaluation 11/19/23    Authorization Type [employer] & Medicaid    Progress Note Due on Visit 10    PT Start Time 1105    PT Stop Time 1147    PT Time Calculation (min) 42 min    Activity Tolerance Patient tolerated treatment well    Behavior During Therapy [facility] for tasks assessed/performed                Past Medical History:  Diagnosis Date   Adhesive capsulitis of left shoulder 12/22/2018   Bilateral shoulder pain 08/24/2021   Cerebrovascular accident (CVA) (HCC) 10/25/2021   Cervical dystonia 03/08/2019   Xeomin approved diagnosis.   COVID 05/29/2021   Dystonia    Edema 02/11/2019   Ganglion cyst 08/25/2020   Glaucoma    Hyperlipidemia 02/11/2019   Hypertension    Hypothyroidism (acquired) 01/19/2019   Inflammatory arthritis 08/24/2021   Mixed incontinence 03/15/2020   Formatting of this note might be different from the original.  Added automatically from request for surgery [id number]   Neck pain 01/19/2019   Post-COVID chronic fatigue 05/29/2021   Post-COVID chronic joint pain 05/29/2021   Post-COVID chronic muscle pain 05/29/2021   Primary open angle glaucoma (POAG) of both eyes, moderate stage 09/12/2021   Sedimentation rate elevation 08/24/2021   Stroke (HCC) 10/2021   Stroke (HCC) 05/2023   Syncope 10/09/2021   Thyroid disease    Type 2 diabetes mellitus (HCC) 01/19/2019   Urgency incontinence 03/15/2020   Formatting of this note might be different from the original.  Added automatically from request for surgery [id number]   Urinary frequency 08/24/2021   Vertigo 12/01/2019   Past Surgical History:  Procedure Laterality Date   LOOP RECORDER INSERTION N/A 06/13/2023   Procedure: LOOP RECORDER INSERTION;  Surgeon:  Regan Lemming, MD;  Location: [facility];  Service: Cardiovascular;  Laterality: N/A;   NO PAST SURGERIES     Patient Active Problem List   Diagnosis Date Noted   Gait abnormality 10/13/2023   Paroxysmal atrial fibrillation (HCC) 07/18/2023   Hypercoagulable state due to paroxysmal atrial fibrillation (HCC) 07/18/2023   Acute encephalopathy 06/11/2023   Osteoarthritis of right knee 12/18/2022   Glaucoma 11/26/2021   Cerebrovascular accident (CVA) (HCC) 10/25/2021   History of stroke 10/2021   Syncope 10/09/2021   Primary open angle glaucoma (POAG) of both eyes, moderate stage 09/12/2021   Sedimentation rate elevation 08/24/2021   Bilateral shoulder pain 08/24/2021   Inflammatory arthritis 08/24/2021   Urinary frequency 08/24/2021   COVID 05/29/2021   Post-COVID chronic fatigue 05/29/2021   Post-COVID chronic joint pain 05/29/2021   Post-COVID chronic muscle pain 05/29/2021   BPPV (benign paroxysmal positional vertigo), right 01/03/2021   Thyroid disease    Ganglion cyst 08/25/2020   Mixed incontinence 03/15/2020   Urgency incontinence 03/15/2020   Blepharitis of upper and lower eyelids of both eyes 01/07/2020   Dermatochalasis of both upper eyelids 01/07/2020   Hypertensive retinopathy of both eyes 01/07/2020   Macular RPE mottling 01/07/2020   Meibomian gland dysfunction (MGD) of both eyes 01/07/2020   Vitreous syneresis of both eyes 01/07/2020   Vertigo 12/01/2019   Cervical dystonia 03/08/2019   Hyperlipidemia 02/11/2019   Edema 02/11/2019  Hypertension 01/19/2019   Type 2 diabetes mellitus (HCC) 01/19/2019   Hypothyroidism (acquired) 01/19/2019   Neck pain 01/19/2019   Dystonia 01/19/2019   Adhesive capsulitis of left shoulder 12/22/2018    PCP: [name], PA-C   REFERRING PROVIDER: Zola Button, Grayling Congress, *   REFERRING DIAG:  M25.50 (ICD-10-CM) - Arthralgia, unspecified joint  Z91.81 (ICD-10-CM) - At risk for fall due to comorbid  condition    THERAPY DIAG:  Unsteadiness on feet  Muscle weakness (generalized)  Chronic pain of both shoulders  Rationale for Evaluation and Treatment: Rehabilitation  ONSET DATE: Hospitalization on 06/11/2023  SUBJECTIVE:   SUBJECTIVE STATEMENT: 11/06/2023 Pt with no new complaints  From MD notes: Patient had history of chronic body pain which was managed with prednisone, but due to affect on her glaucoma, replaced by meloxicam by rheumatologist, but had to stop for ASA/plavis therapy for Afib, affecting her QOL.      PERTINENT HISTORY: AFIB, HTN, ischemic CVA, aortic atherosclerosis by CT imaging, cervical dystonia, chronic tremor, glaucoma, hypothyroidism, T2DM, and paroxysmal atrial fibrillation, Recent hospital admission 10/30-11/1 for CVA and underwent ILR placement on 06/13/23; arthritic complaints, specifically upper shoulders bilaterally   PAIN:  Are you having pain? No  PRECAUTIONS: Fall  Implanted loop recorder.   RED FLAGS: None   WEIGHT BEARING RESTRICTIONS: No  FALLS:  Has patient fallen in last 6 months? No  LIVING ENVIRONMENT: Lives with: lives alone sons live close  Lives in: House/apartment Stairs: No Has following equipment at home: Single point cane and Environmental consultant - 2 wheeled  OCCUPATION: retired  PLOF: Independent with basic ADLs and Independent with household mobility with device  PATIENT GOALS: improve her mobility  NEXT MD VISIT: 09/16/23 with rheumatologist  OBJECTIVE:  Note: Objective measures were completed at Evaluation unless otherwise noted.  DIAGNOSTIC FINDINGS:  06/12/23 MR Brain IMPRESSION: 1. Small acute/subacute infarcts in the left sub insular region and posterior left temporal lobe. 2. Remote infarcts in the left occipital lobe, thalami and bilateral cerebellar hemispheres. 3. Advanced chronic microvascular ischemic changes of the white matter. 4. At least moderate spinal canal stenosis at C3-4.  PATIENT SURVEYS:  ABC scale  650/1600 = 40.6%  COGNITION: Overall cognitive status: Impaired appears to have memory deficits, asked therapists name several times throughout session.      POSTURE: rounded shoulders and forward head  PALPATION: Noted decreased joint mobility in distal radial/ulnar, carpal joints bilaterally limiting wrist ROM  UPPER EXTREMITY MMT:  MMT Right eval Left eval  Shoulder flexion 3 3  Shoulder extension    Shoulder abduction 4 4  Shoulder adduction    Shoulder extension    Shoulder internal rotation 4 4  Shoulder external rotation    Elbow flexion 5 5  Elbow extension    Wrist flexion 5 5  Grip strength good good   (Blank rows = not tested)  UPPER EXTREMITY ROM:  Active ROM Right eval Left eval  Shoulder flexion 80a 120aa 75a 120aa  Shoulder abduction 70 70   (Blank rows = not tested)  LOWER EXTREMITY MMT:  MMT Right eval Left eval  Hip flexion 3 3  Hip extension    Hip abduction 3 3  Hip adduction 3 3  Knee flexion 4 4  Knee extension 5 5  Ankle dorsiflexion    Ankle plantarflexion     (Blank rows = not tested)  FUNCTIONAL TESTS:  5 times sit to stand: 46 sec MCTSIB: Condition 1: Avg of 3 trials: 30 sec,  Condition 2: Avg of 3 trials: 30 sec, Condition 3: Avg of 3 trials: 9 sec, Condition 4: Avg of 3 trials: 0 sec, and Total Score: 69/120  GAIT: Distance walked: 50 Assistive device utilized: Walker - 2 wheeled Level of assistance: Modified independence Comments: Gait speed 0.5 m/s                                                                                                                                TREATMENT DATE:  11/06/23 Therapeutic Activity:  to improve activity tolerance, mobility  Nustep L5  x 6 min  Knee extension 5# BLE x 20 Hamstring curls 5# BLE x 20 Standing heel raises/toe raises x 20 Standing hip abduction x10 Standing marches x 10 Sit to stands x 10 Tinetti gait assessment: 21/28 (75%) Gait Speed- 0.78 11/03/23 Therapeutic  Activity:  to improve activity tolerance, mobility  Nustep L4  x 6 min  Walking with SBQC around obstacle course incuding cones and weights to stop over and around  - no LOB or instability, hitting cones with R foot occastionally, CGA for safety Side stepping over ankle weights on floor without AD - needed occasional HHA, occasional small LOB with only minA needed Forward stepping over ankle weights without AD -  occasional minA needed for small LOB.    10/27/23 Therapeutic Exercise: to improve strength and mobility.  Demo, verbal and tactile cues throughout for technique. Pulleys x 5 min  Knee extension 5# BLE x 20 Hamstring curls 5# BLE x 20   Therapeutic Activity:  to improve gait and balance Gait x 200' with [facility] - noted frequently drifting and catching cane on items on right, cues needed.  Walking with SBQC around cones - gradually moving cones closer, again knocking cones over on right Stepping over obstacles - using small ankle and hand weights for obstacles and quad cane, no LOB, caught R foot x 1 on larger weight   10/23/23 Gait around clincin 90 ft 2x with AD; x 2 laps n AD therapist hand assist Sit to stand x 10  Standing marching x 10 B Standing hip abduction x 10 B Standing hip extension x 10 B Knee extension 5lb BLE x 10  Knee flexion 5lb x 10 BLE 5xSTS- 32.21 seconds  10/09/23 Therapeutic Exercise: to improve strength and mobility.  Demo, verbal and tactile cues throughout for technique. Pulleys x 4 min  Nustep L5 x 5 min  Sit to stands 2 x 10  -with bil UE support from walker: Standing marching x 20 Standing heel raises x 20 Standing hip abduction 2 x 10 bil  Standing hip extension x 10 bil  Manual Therapy: to improve mobility Joint mobs to bil wrists focusing on distal radial-ulnar joint    PATIENT EDUCATION:  Education details: information provided on adjustable ankle weights and arm pulleys Person educated: Patient Education method: Explanation and  Handouts Education comprehension: verbalized understanding  HOME EXERCISE PROGRAM: Access  Code: [email or web address] URL: [email or web address] Date: 09/22/2023 Prepared by: Harrie Foreman  Exercises - Sit to Stand with Counter Support  - 1 x daily - 7 x weekly - 2 sets - 5 reps - Seated Bilateral Shoulder Flexion Towel Slide at Table Top  - 1 x daily - 7 x weekly - 2 sets - 10 reps - Seated Shoulder Scaption Slide at Table Top with Forearm in Neutral  - 1 x daily - 7 x weekly - 2 sets - 10 reps - Heel Raises with Counter Support  - 1 x daily - 7 x weekly - 2 sets - 10 reps - Standing Hip Abduction with Counter Support  - 1 x daily - 7 x weekly - 2 sets - 10 reps - Standing March with Counter Support  - 1 x daily - 7 x weekly - 2 sets - 10 reps  Patient Education - How to Prevent Falls - Spanish  ASSESSMENT:  CLINICAL IMPRESSION: Leslie Ward has progressed very well. She has met her goal for Tinetti balance assessment and gait speed. Reviewed her HEP for D/C and instructed her on the correct way to perform the exercises. She is in agreement with 30 day hold due to her progress. Leslie Ward continues to demonstrate potential for improvement and would benefit from continued skilled therapy to address impairments.      OBJECTIVE IMPAIRMENTS: Abnormal gait, decreased activity tolerance, decreased balance, decreased endurance, decreased knowledge of condition, decreased mobility, difficulty walking, decreased ROM, decreased strength, decreased safety awareness, hypomobility, increased fascial restrictions, impaired perceived functional ability, increased muscle spasms, impaired flexibility, impaired UE functional use, and pain.   ACTIVITY LIMITATIONS: carrying, lifting, bending, standing, squatting, transfers, dressing, reach over head, hygiene/grooming, and locomotion level  PARTICIPATION LIMITATIONS: meal prep, cleaning, laundry, and community activity  PERSONAL FACTORS:  Age, Fitness, Past/current experiences, Time since onset of injury/illness/exacerbation, and 3+ comorbidities: AFIB, HTN, ischemic CVA, aortic atherosclerosis by CT imaging, cervical dystonia, chronic tremor, glaucoma, hypothyroidism, T2DM, and paroxysmal atrial fibrillation, Recent hospital admission 10/30-11/1 for CVA and underwent ILR placement on 06/13/23; arthritic complaints, specifically upper shoulders bilaterally   are also affecting patient's functional outcome.   REHAB POTENTIAL: Good  CLINICAL DECISION MAKING: Evolving/moderate complexity  EVALUATION COMPLEXITY: Moderate   GOALS: Goals reviewed with patient? Yes  SHORT TERM GOALS: Target date: 09/24/2023   Patient will be independent with initial HEP. Baseline: given Goal status: MET 09/25/23 reports compliance with table slides and sit to stands, does not remember updates.  09/29/23- MET  2.  Patient will be educated on strategies to decrease risk of falls.  Baseline:  Goal status: MET 09/23/23 Handout provided.    LONG TERM GOALS: Target date: 11/19/2023   Patient will be independent with advanced/ongoing HEP to improve outcomes and carryover.  Baseline:  Goal status: IN PROGRESS 11/03/23- met for current, also using floor bike at home and has ankle weights now too  2.  Patient will be able to ambulate 600' with LRAD with good safety to access community.  Baseline:  Goal status: IN PROGRESS 11/03/23 - ambulated 200' with [facility] but still fatiques quickly and difficulty clearing objects on Right  3. Patient will demonstrate improved functional LE strength as demonstrated by 5x STS < 30 seconds. Baseline: 46 seconds Goal status: IN PROGRESS 11/03/23 23 seconds   4.  Patient will demonstrate > 19/28 on Tinetti to improve gait stability and reduce risk for falls. Baseline: NT Goal status: MET- 11/06/23 (21/28 75%)  5.  Patient will  report 50% improvement in bil shoulder mobility to improve ease of dressing and ADLs.  Baseline:   Goal status: MET 10/09/23 - reports no pain 11/03/23 no pain in shoulders.   6.  Patient will report 14 points improvement on ABC scale to demonstrate improved functional ability. Baseline: 40.6% confidence Goal status: IN PROGRESS  7.  Patient will demonstrate gait speed of >0.55 m/s) to be a safe limited community ambulator with decreased risk for recurrent falls.  Baseline: 0.5 m/s Goal status: MET- 11/06/23 0.78   PLAN:  PT FREQUENCY: 1-2x/week  PT DURATION: 10 weeks  PLANNED INTERVENTIONS: 97110-Therapeutic exercises, 97530- Therapeutic activity, 97112- Neuromuscular re-education, 97535- Self Care, 56213- Manual therapy, [phone]- Gait training, [phone]- Ultrasound, Patient/Family education, Balance training, Stair training, Taping, Dry Needling, Joint mobilization, Joint manipulation, Spinal manipulation, Spinal mobilization, Cryotherapy, and Moist heat  PLAN FOR NEXT SESSION:  30 day hold   Kejon Feild L Chestine Spore, PTA 11/06/2023, 11:53 AM

## 2023-11-09 ENCOUNTER — Encounter: Payer: Self-pay | Admitting: Medical

## 2023-11-09 ENCOUNTER — Encounter: Payer: Self-pay | Admitting: Cardiology

## 2023-11-12 ENCOUNTER — Other Ambulatory Visit: Payer: Self-pay | Admitting: Medical

## 2023-11-13 DIAGNOSIS — M2041 Other hammer toe(s) (acquired), right foot: Secondary | ICD-10-CM | POA: Diagnosis not present

## 2023-11-13 DIAGNOSIS — L600 Ingrowing nail: Secondary | ICD-10-CM | POA: Diagnosis not present

## 2023-11-14 ENCOUNTER — Ambulatory Visit (INDEPENDENT_AMBULATORY_CARE_PROVIDER_SITE_OTHER): Admitting: Medical

## 2023-11-14 ENCOUNTER — Encounter: Payer: Self-pay | Admitting: Medical

## 2023-11-14 VITALS — BP 149/79 | HR 78 | Temp 97.7°F | Resp 18 | Ht <= 58 in | Wt 114.6 lb

## 2023-11-14 DIAGNOSIS — R1013 Epigastric pain: Secondary | ICD-10-CM | POA: Diagnosis not present

## 2023-11-14 DIAGNOSIS — R350 Frequency of micturition: Secondary | ICD-10-CM | POA: Diagnosis not present

## 2023-11-14 DIAGNOSIS — R102 Pelvic and perineal pain: Secondary | ICD-10-CM

## 2023-11-14 LAB — CBC WITH DIFFERENTIAL/PLATELET
Basophils Absolute: 0 K/uL (ref 0.0–0.1)
Basophils Relative: 0.5 % (ref 0.0–3.0)
Eosinophils Absolute: 0.1 K/uL (ref 0.0–0.7)
Eosinophils Relative: 0.6 % (ref 0.0–5.0)
HCT: 40.7 % (ref 36.0–46.0)
Hemoglobin: 13.5 g/dL (ref 12.0–15.0)
Lymphocytes Relative: 38 % (ref 12.0–46.0)
Lymphs Abs: 3.9 K/uL (ref 0.7–4.0)
MCHC: 33.1 g/dL (ref 30.0–36.0)
MCV: 91.9 fl (ref 78.0–100.0)
Monocytes Absolute: 0.6 K/uL (ref 0.1–1.0)
Monocytes Relative: 5.4 % (ref 3.0–12.0)
Neutro Abs: 5.7 K/uL (ref 1.4–7.7)
Neutrophils Relative %: 55.5 % (ref 43.0–77.0)
Platelets: 364 K/uL (ref 150.0–400.0)
RBC: 4.43 Mil/uL (ref 3.87–5.11)
RDW: 15.5 % (ref 11.5–15.5)
WBC: 10.3 K/uL (ref 4.0–10.5)

## 2023-11-14 LAB — POC URINALSYSI DIPSTICK (AUTOMATED)
Bilirubin, UA: NEGATIVE
Blood, UA: NEGATIVE
Glucose, UA: NEGATIVE
Ketones, UA: NEGATIVE
Nitrite, UA: NEGATIVE
Protein, UA: NEGATIVE
Spec Grav, UA: 1.015
Urobilinogen, UA: 0.2 U/dL
pH, UA: 5

## 2023-11-14 LAB — COMPREHENSIVE METABOLIC PANEL WITH GFR
ALT: 12 U/L (ref 0–35)
AST: 14 U/L (ref 0–37)
Albumin: 4.4 g/dL (ref 3.5–5.2)
Alkaline Phosphatase: 70 U/L (ref 39–117)
BUN: 23 mg/dL (ref 6–23)
CO2: 28 meq/L (ref 19–32)
Calcium: 9.3 mg/dL (ref 8.4–10.5)
Chloride: 105 meq/L (ref 96–112)
Creatinine, Ser: 0.74 mg/dL (ref 0.40–1.20)
GFR: 70.88 mL/min
Glucose, Bld: 107 mg/dL — ABNORMAL HIGH (ref 70–99)
Potassium: 3.9 meq/L (ref 3.5–5.1)
Sodium: 142 meq/L (ref 135–145)
Total Bilirubin: 1.1 mg/dL (ref 0.2–1.2)
Total Protein: 7.3 g/dL (ref 6.0–8.3)

## 2023-11-14 LAB — LIPASE: Lipase: 47 U/L (ref 11.0–59.0)

## 2023-11-14 MED ORDER — FAMOTIDINE 20 MG PO TABS: 20.0000 mg | ORAL_TABLET | Freq: Every day | ORAL | 0 refills | Status: DC

## 2023-11-14 NOTE — Progress Notes (Signed)
 Subjective:    Patient ID: Leslie Ward, female    DOB: Nov 04, 1930, 89 y.o.   MRN: [id number]  HPI Pt in for some lower abdomen pain for past few days. Then this morning pain resolved.     Review of Systems  Constitutional:  Negative for chills, fatigue and fever.  HENT:  Negative for congestion.   Respiratory:  Negative for cough, chest tightness, shortness of breath and wheezing.   Cardiovascular:  Negative for chest pain and palpitations.  Gastrointestinal:  Negative for abdominal pain, blood in stool, constipation, diarrhea, nausea and vomiting.       See hpi  Genitourinary:  Positive for frequency.  Musculoskeletal:  Negative for back pain and myalgias.  Neurological:  Negative for dizziness, syncope, weakness and numbness.  Hematological:  Negative for adenopathy. Does not bruise/bleed easily.  Psychiatric/Behavioral:  Negative for behavioral problems and confusion.     Past Medical History:  Diagnosis Date   Adhesive capsulitis of left shoulder 12/22/2018   Bilateral shoulder pain 08/24/2021   Cerebrovascular accident (CVA) (HCC) 10/25/2021   Cervical dystonia 03/08/2019   Xeomin approved diagnosis.   COVID 05/29/2021   Dystonia    Edema 02/11/2019   Ganglion cyst 08/25/2020   Glaucoma    Hyperlipidemia 02/11/2019   Hypertension    Hypothyroidism (acquired) 01/19/2019   Inflammatory arthritis 08/24/2021   Mixed incontinence 03/15/2020   Formatting of this note might be different from the original.  Added automatically from request for surgery [id number]   Neck pain 01/19/2019   Post-COVID chronic fatigue 05/29/2021   Post-COVID chronic joint pain 05/29/2021   Post-COVID chronic muscle pain 05/29/2021   Primary open angle glaucoma (POAG) of both eyes, moderate stage 09/12/2021   Sedimentation rate elevation 08/24/2021   Stroke (HCC) 10/2021   Stroke (HCC) 05/2023   Syncope 10/09/2021   Thyroid disease    Type 2 diabetes mellitus (HCC) 01/19/2019   Urgency  incontinence 03/15/2020   Formatting of this note might be different from the original.  Added automatically from request for surgery [id number]   Urinary frequency 08/24/2021   Vertigo 12/01/2019     Social History   Socioeconomic History   Marital status: Widowed    Spouse name: Not on file   Number of children: 2   Years of education: 6 or 7 years of education   Highest education level: Not on file  Occupational History   Occupation: Retired  Tobacco Use   Smoking status: Never    Passive exposure: Past   Smokeless tobacco: Never   Tobacco comments:    Never smoker 07/18/23  Vaping Use   Vaping status: Never Used  Substance and Sexual Activity   Alcohol use: No   Drug use: No   Sexual activity: Not on file  Other Topics Concern   Not on file  Social History Narrative   Lives alone.   Right-handed.   No caffeine use.   Social Drivers of Corporate investment banker Strain: Low Risk  (10/01/2021)   Overall Financial Resource Strain (CARDIA)    Difficulty of Paying Living Expenses: Not hard at all  Food Insecurity: No Food Insecurity (06/17/2023)   Hunger Vital Sign    Worried About Running Out of Food in the Last Year: Never true    Ran Out of Food in the Last Year: Never true  Transportation Needs: No Transportation Needs (06/17/2023)   PRAPARE - Administrator, Civil Service (Medical): No  Lack of Transportation (Non-Medical): No  Physical Activity: Inactive (10/01/2021)   Exercise Vital Sign    Days of Exercise per Week: 0 days    Minutes of Exercise per Session: 0 min  Stress: No Stress Concern Present (10/01/2021)   [employer] of Occupational Health - Occupational Stress Questionnaire    Feeling of Stress : Not at all  Social Connections: Moderately Isolated (12/18/2022)   Social Connection and Isolation Panel [NHANES]    Frequency of Communication with Friends and Family: More than three times a week    Frequency of Social Gatherings with  Friends and Family: Twice a week    Attends Religious Services: More than 4 times per year    Active Member of [employer] or Organizations: No    Attends Banker Meetings: Never    Marital Status: Widowed  Intimate Partner Violence: Not At Risk (06/12/2023)   Humiliation, Afraid, Rape, and Kick questionnaire    Fear of Current or Ex-Partner: No    Emotionally Abused: No    Physically Abused: No    Sexually Abused: No    Past Surgical History:  Procedure Laterality Date   LOOP RECORDER INSERTION N/A 06/13/2023   Procedure: LOOP RECORDER INSERTION;  Surgeon: Regan Lemming, MD;  Location: [facility];  Service: Cardiovascular;  Laterality: N/A;   NO PAST SURGERIES      Family History  Problem Relation Age of Onset   Stroke Mother    Heart attack Father    Heart Problems Brother    High blood pressure Maternal Grandmother    Liver disease Neg Hx    Colon cancer Neg Hx    Esophageal cancer Neg Hx     Allergies  Allergen Reactions   Crestor [Rosuvastatin Calcium] Other (See Comments)    "caused me to go into kidney failure"    Current Outpatient Medications on File Prior to Visit  Medication Sig Dispense Refill   acetaminophen (TYLENOL) 650 MG CR tablet Take 650 mg by mouth every 8 (eight) hours as needed for pain.     amLODipine (NORVASC) 5 MG tablet 1 tab po bid 60 tablet 11   apixaban (ELIQUIS) 2.5 MG TABS tablet TAKE 1 TABLET BY MOUTH 2 TIMES A DAY 60 tablet 11   atorvastatin (LIPITOR) 20 MG tablet Take 1 tablet (20 mg total) by mouth daily. 90 tablet 3   Calcium Carbonate (CALCIUM 600 PO) Take 1 tablet by mouth daily.     Cholecalciferol (VITAMIN D) 125 MCG (5000 UT) CAPS Take 5,000 Units by mouth daily.     Cinnamon 500 MG capsule Take 1,000 mg by mouth daily.     dorzolamide-timolol (COSOPT) 2-0.5 % ophthalmic solution Place 1 drop into both eyes 2 (two) times daily.     hydrocortisone (ANUSOL-HC) 25 MG suppository Place 1 suppository (25 mg total)  rectally 2 (two) times daily. (Patient taking differently: Place 25 mg rectally as needed.) 12 suppository 0   latanoprost (XALATAN) 0.005 % ophthalmic solution Place 1 drop into both eyes every evening.     levothyroxine (SYNTHROID) 50 MCG tablet TAKE 1 TABLET BY MOUTH DAILY BEFORE BREAKFAST 30 tablet 0   meclizine (ANTIVERT) 25 MG tablet Take 0.5-1 tablets (12.5-25 mg total) by mouth 3 (three) times daily as needed for dizziness. 30 tablet 0   Menaquinone-7 (VITAMIN K2 PO) Take 1 tablet by mouth at bedtime.     metoprolol tartrate (LOPRESSOR) 50 MG tablet Take 1 tablet (50 mg total) by  mouth 2 (two) times daily.     Phytosterol Esters (CHOLEST CARE PO) Take 1,600 mg by mouth daily.     Probiotic Product (PROBIOTIC DAILY PO) Take 1 tablet by mouth at bedtime.     vitamin C (ASCORBIC ACID) 500 MG tablet Take 500 mg by mouth daily.     Current Facility-Administered Medications on File Prior to Visit  Medication Dose Route Frequency Provider Last Rate Last Admin   incobotulinumtoxinA (XEOMIN) 100 units injection 100 Units  100 Units Intramuscular Q90 days Levert Feinstein, MD       incobotulinumtoxinA [facility]) 100 units injection 100 Units  100 Units Intramuscular Q90 days Levert Feinstein, MD   100 Units at 05/03/22 2307    BP (!) 149/79   Pulse 78   Temp 97.7 F (36.5 C) (Oral)   Resp 18   Ht 4\' 9"  (1.448 m)   Wt 114 lb 9.6 oz (52 kg)   SpO2 100%   BMI 24.80 kg/m        Objective:   Physical Exam  General Mental Status- Alert. General Appearance- Not in acute distress.   Skin General: Color- Normal Color. Moisture- Normal Moisture.  Neck Carotid Arteries- Normal color. Moisture- Normal Moisture. No carotid bruits. No JVD.  Chest and Lung Exam Auscultation: Breath Sounds:-Normal.  Cardiovascular Auscultation:Rythm- Regular. Murmurs & Other Heart Sounds:Auscultation of the heart reveals- No Murmurs.  Abdomen Inspection:-Inspeection Normal. Palpation/Percussion:Note:No mass.  Palpation and Percussion of the abdomen reveal- Non Tender, Non Distended + BS, no rebound or guarding.  Back- no cva tenderness   Neurologic Cranial Nerve exam:- CN III-XII intact(No nystagmus), symmetric smile. Strength:- 5/5 equal and symmetric strength both upper and lower extremities.       Assessment & Plan:   Patient Instructions  Abdominal Pain(mostly described in suprapubic area when had and some frequent urination. At times pain was epigastric Pain resolved spontaneously, suggesting a non-urgent condition. Differential includes UTI, GI issues, or other abdominal pathology. Further evaluation warranted. - Order CBC, metabolic panel, and lipase level. - Obtain urine sample and urine culture. - Monitor for recurrence of pain and associated symptoms such as fever, nausea, or vomiting. - Consider imaging studies if pain recurs or if lab results are indicate further studies needed -famotadine for recurrent epigastric pain or gerd like symptoms.  Hypertension Blood pressure elevated due to missed antihypertensive medication. Subsequent reading improved. - Instruct her to take antihypertensive medication upon returning home. - Educated on the importance of taking medications even when fasting for blood work.  Atrial Fibrillation On Eliquis. Flying deemed safe by cardiologist. Well-managed condition and previous flying experience no issues. - Continue Eliquis as prescribed.   General Health Maintenance Discussed importance of hydration and medication adherence, especially when fasting for blood work. - Encourage hydration before blood draws. - Reinforce medication adherence even when fasting.  Follow up date to be determined after urine studies and lab results back. - any new signs/symptoms or recurrent symptoms prior to trip please let me know.   [name], PA-C

## 2023-11-14 NOTE — Patient Instructions (Signed)
 Abdominal Pain(mostly described in suprapubic area when had and some frequent urination. At times pain was epigastric Pain resolved spontaneously, suggesting a non-urgent condition. Differential includes UTI, GI issues, or other abdominal pathology. Further evaluation warranted. - Order CBC, metabolic panel, and lipase level. - Obtain urine sample and urine culture. - Monitor for recurrence of pain and associated symptoms such as fever, nausea, or vomiting. - Consider imaging studies if pain recurs or if lab results are indicate further studies needed -famotadine for recurrent epigastric pain or gerd like symptoms.  Hypertension Blood pressure elevated due to missed antihypertensive medication. Subsequent reading improved. - Instruct her to take antihypertensive medication upon returning home. - Educated on the importance of taking medications even when fasting for blood work.  Atrial Fibrillation On Eliquis. Flying deemed safe by cardiologist. Well-managed condition and previous flying experience no issues. - Continue Eliquis as prescribed.   General Health Maintenance Discussed importance of hydration and medication adherence, especially when fasting for blood work. - Encourage hydration before blood draws. - Reinforce medication adherence even when fasting.  Follow up date to be determined after urine studies and lab results back. - any new signs/symptoms or recurrent symptoms prior to trip please let me know.

## 2023-11-15 LAB — URINE CULTURE
MICRO NUMBER:: 16290427
SPECIMEN QUALITY:: ADEQUATE

## 2023-11-25 ENCOUNTER — Ambulatory Visit (INDEPENDENT_AMBULATORY_CARE_PROVIDER_SITE_OTHER): Payer: Medicare HMO

## 2023-11-25 DIAGNOSIS — I639 Cerebral infarction, unspecified: Secondary | ICD-10-CM | POA: Diagnosis not present

## 2023-11-26 ENCOUNTER — Other Ambulatory Visit: Payer: Self-pay | Admitting: Medical

## 2023-11-26 LAB — CUP PACEART REMOTE DEVICE CHECK
Date Time Interrogation Session: 20250415095713
Implantable Pulse Generator Implant Date: 20241101

## 2023-11-29 ENCOUNTER — Encounter (HOSPITAL_BASED_OUTPATIENT_CLINIC_OR_DEPARTMENT_OTHER): Payer: Self-pay

## 2023-11-29 ENCOUNTER — Emergency Department (HOSPITAL_BASED_OUTPATIENT_CLINIC_OR_DEPARTMENT_OTHER)
Admission: EM | Admit: 2023-11-29 | Discharge: 2023-11-29 | Disposition: A | Discharge status: Home / Self Care | Attending: Emergency Medicine | Admitting: Emergency Medicine

## 2023-11-29 ENCOUNTER — Emergency Department (HOSPITAL_BASED_OUTPATIENT_CLINIC_OR_DEPARTMENT_OTHER)

## 2023-11-29 DIAGNOSIS — K5790 Diverticulosis of intestine, part unspecified, without perforation or abscess without bleeding: Secondary | ICD-10-CM | POA: Diagnosis not present

## 2023-11-29 DIAGNOSIS — K5732 Diverticulitis of large intestine without perforation or abscess without bleeding: Secondary | ICD-10-CM | POA: Insufficient documentation

## 2023-11-29 DIAGNOSIS — K5730 Diverticulosis of large intestine without perforation or abscess without bleeding: Secondary | ICD-10-CM | POA: Diagnosis not present

## 2023-11-29 DIAGNOSIS — R1032 Left lower quadrant pain: Secondary | ICD-10-CM | POA: Diagnosis not present

## 2023-11-29 DIAGNOSIS — K8020 Calculus of gallbladder without cholecystitis without obstruction: Secondary | ICD-10-CM | POA: Insufficient documentation

## 2023-11-29 LAB — CBC
HCT: 39 % (ref 36.0–46.0)
Hemoglobin: 12.9 g/dL (ref 12.0–15.0)
MCH: 30.2 pg (ref 26.0–34.0)
MCHC: 33.1 g/dL (ref 30.0–36.0)
MCV: 91.3 fL (ref 80.0–100.0)
Platelets: 280 10*3/uL (ref 150–400)
RBC: 4.27 MIL/uL (ref 3.87–5.11)
RDW: 14.8 % (ref 11.5–15.5)
WBC: 9 10*3/uL (ref 4.0–10.5)
nRBC: 0 % (ref 0.0–0.2)

## 2023-11-29 LAB — URINALYSIS, ROUTINE W REFLEX MICROSCOPIC
Bilirubin Urine: NEGATIVE
Glucose, UA: NEGATIVE mg/dL
Hgb urine dipstick: NEGATIVE
Ketones, ur: NEGATIVE mg/dL
Leukocytes,Ua: NEGATIVE
Nitrite: NEGATIVE
Protein, ur: NEGATIVE mg/dL
Specific Gravity, Urine: 1.02 (ref 1.005–1.030)
pH: 7 (ref 5.0–8.0)

## 2023-11-29 LAB — COMPREHENSIVE METABOLIC PANEL WITH GFR
ALT: 25 U/L (ref 0–44)
AST: 21 U/L (ref 15–41)
Albumin: 3.3 g/dL — ABNORMAL LOW (ref 3.5–5.0)
Alkaline Phosphatase: 71 U/L (ref 38–126)
Anion gap: 9 (ref 5–15)
BUN: 23 mg/dL (ref 8–23)
CO2: 26 mmol/L (ref 22–32)
Calcium: 9 mg/dL (ref 8.9–10.3)
Chloride: 103 mmol/L (ref 98–111)
Creatinine, Ser: 0.63 mg/dL (ref 0.44–1.00)
GFR, Estimated: >60 mL/min (ref >60)
Glucose, Bld: 153 mg/dL — ABNORMAL HIGH (ref 70–99)
Potassium: 3.2 mmol/L — ABNORMAL LOW (ref 3.5–5.1)
Sodium: 138 mmol/L (ref 135–145)
Total Bilirubin: 1.1 mg/dL (ref 0.0–1.2)
Total Protein: 7 g/dL (ref 6.5–8.1)

## 2023-11-29 LAB — LIPASE, BLOOD: Lipase: 31 U/L (ref 11–51)

## 2023-11-29 MED ORDER — SODIUM CHLORIDE 0.9 % IV BOLUS
500.0000 mL | Freq: Once | INTRAVENOUS | Status: AC
  Administered 2023-11-29: 500 mL via INTRAVENOUS

## 2023-11-29 MED ORDER — IOHEXOL 300 MG/ML  SOLN
75.0000 mL | Freq: Once | INTRAMUSCULAR | Status: AC | PRN
  Administered 2023-11-29: 75 mL via INTRAVENOUS

## 2023-11-29 NOTE — ED Notes (Signed)
 Patient at bedside drinking contrast.  Placed bedside commode per patient/radiology request.

## 2023-11-29 NOTE — ED Provider Notes (Signed)
 [place] [facility] Provider Note   CSN: [id number] Arrival date & time: 11/29/23  1648     History {Add pertinent medical, surgical, social history, OB history to HPI:1} Chief Complaint  Patient presents with   Abdominal Pain    Leslie Ward is a 89 y.o. female.  She is brought in by family member who is helping translate.  She declines formal interpreter.  She has had 1 week of left-sided lower abdominal pain.  Worse with walking.  She saw her primary care doctor who did some lab work and a urinalysis.  She traveled to [place]  and did okay but pain has been getting progressively worse over the last 2 days.  No change in her bowels no diarrhea or constipation.  She did have a little bit of discomfort with urination today.  No fevers or chills nausea vomiting.  No prior abdominal surgeries.  The history is provided by the patient and a relative.  Abdominal Pain Pain location:  LLQ Pain quality: aching   Pain radiates to:  Does not radiate Pain severity:  Moderate Onset quality:  Gradual Duration:  1 week Timing:  Intermittent Progression:  Worsening Chronicity:  New Context: not trauma   Relieved by:  Nothing Worsened by:  Movement Ineffective treatments:  None tried Associated symptoms: dysuria   Associated symptoms: no chills, no constipation, no cough, no diarrhea, no fever, no hematemesis, no hematochezia, no hematuria, no nausea, no vaginal bleeding and no vomiting   Risk factors: has not had multiple surgeries        Home Medications Prior to Admission medications   Medication Sig Start Date End Date Taking? Authorizing Provider  acetaminophen  (TYLENOL ) 650 MG CR tablet Take 650 mg by mouth every 8 (eight) hours as needed for pain.    [provider]  amLODipine  (NORVASC ) 5 MG tablet 1 tab po bid 07/09/23   Saguier, Gaylin Ke, PA-C  apixaban  (ELIQUIS ) 2.5 MG TABS tablet TAKE 1 TABLET BY MOUTH 2 TIMES A DAY 09/17/23    Nathanel Bal, PA-C  atorvastatin  (LIPITOR) 20 MG tablet Take 1 tablet (20 mg total) by mouth daily. 07/04/23   Saguier, Gaylin Ke, PA-C  Calcium  Carbonate (CALCIUM  600 PO) Take 1 tablet by mouth daily.    [provider]  Cholecalciferol (VITAMIN D) 125 MCG (5000 UT) CAPS Take 5,000 Units by mouth daily.    [provider]  Cinnamon 500 MG capsule Take 1,000 mg by mouth daily.    [provider]  dorzolamide -timolol  (COSOPT ) 2-0.5 % ophthalmic solution Place 1 drop into both eyes 2 (two) times daily. 10/30/22   [provider]  famotidine  (PEPCID ) 20 MG tablet Take 1 tablet (20 mg total) by mouth daily. 11/14/23   Saguier, Gaylin Ke, PA-C  hydrocortisone  (ANUSOL -HC) 25 MG suppository Place 1 suppository (25 mg total) rectally 2 (two) times daily. Patient taking differently: Place 25 mg rectally as needed. 11/11/22   Saguier, Gaylin Ke, PA-C  latanoprost (XALATAN) 0.005 % ophthalmic solution Place 1 drop into both eyes every evening. 09/12/21   [provider]  levothyroxine  (SYNTHROID ) 50 MCG tablet TAKE 1 TABLET BY MOUTH DAILY BEFORE BREAKFAST 11/26/23   Saguier, Gaylin Ke, PA-C  meclizine  (ANTIVERT ) 25 MG tablet Take 0.5-1 tablets (12.5-25 mg total) by mouth 3 (three) times daily as needed for dizziness. 10/10/23   Harris, Abigail, PA-C  Menaquinone-7 (VITAMIN K2 PO) Take 1 tablet by mouth at bedtime.    [provider]  metoprolol  tartrate (LOPRESSOR )  50 MG tablet TAKE 1 TABLET BY MOUTH 2 TIMES A DAY 11/26/23   Saguier, Gaylin Ke, PA-C  Phytosterol Esters (CHOLEST CARE PO) Take 1,600 mg by mouth daily.    [provider]  Probiotic Product (PROBIOTIC DAILY PO) Take 1 tablet by mouth at bedtime.    [provider]  vitamin C (ASCORBIC ACID) 500 MG tablet Take 500 mg by mouth daily.    [provider]      Allergies    Crestor [rosuvastatin calcium ]    Review of Systems   Review of Systems  Constitutional:  Negative for chills and  fever.  Respiratory:  Negative for cough.   Gastrointestinal:  Positive for abdominal pain. Negative for constipation, diarrhea, hematemesis, hematochezia, nausea and vomiting.  Genitourinary:  Positive for dysuria. Negative for hematuria and vaginal bleeding.    Physical Exam Updated Vital Signs BP (!) 172/61 (BP Location: Right Arm)   Pulse 91   Temp 98.5 F (36.9 C) (Oral)   Resp 16   Ht 4\' 9"  (1.448 m)   Wt 52 kg   SpO2 97%   BMI 24.81 kg/m  Physical Exam Vitals and nursing note reviewed.  Constitutional:      General: She is not in acute distress.    Appearance: Normal appearance. She is well-developed.  HENT:     Head: Normocephalic and atraumatic.  Eyes:     Conjunctiva/sclera: Conjunctivae normal.  Cardiovascular:     Rate and Rhythm: Normal rate and regular rhythm.     Heart sounds: No murmur heard. Pulmonary:     Effort: Pulmonary effort is normal. No respiratory distress.     Breath sounds: Normal breath sounds. No stridor. No wheezing.  Abdominal:     Palpations: Abdomen is soft.     Tenderness: There is no abdominal tenderness. There is no guarding or rebound.  Musculoskeletal:        General: No tenderness or deformity. Normal range of motion.     Cervical back: Neck supple.  Skin:    General: Skin is warm and dry.     Capillary Refill: Capillary refill takes less than 2 seconds.  Neurological:     General: No focal deficit present.     Mental Status: She is alert.     GCS: GCS eye subscore is 4. GCS verbal subscore is 5. GCS motor subscore is 6.     ED Results / Procedures / Treatments   Labs (all labs ordered are listed, but only abnormal results are displayed) Labs Reviewed  LIPASE, BLOOD  COMPREHENSIVE METABOLIC PANEL WITH GFR  CBC  URINALYSIS, ROUTINE W REFLEX MICROSCOPIC    EKG None  Radiology No results found.  Procedures Procedures  {Document cardiac monitor, telemetry assessment procedure when appropriate:1}  Medications  Ordered in ED Medications  sodium chloride  0.9 % bolus 500 mL (has no administration in time range)    ED Course/ Medical Decision Making/ A&P   {   Click here for ABCD2, HEART and other calculatorsREFRESH Note before signing :1}                              Medical Decision Making Amount and/or Complexity of Data Reviewed Labs: ordered.   This patient complains of ***; this involves an extensive number of treatment Options and is a complaint that carries with it a high risk of complications and morbidity. The differential includes ***  I ordered, reviewed and  interpreted labs, which included *** I ordered medication *** and reviewed PMP when indicated. I ordered imaging studies which included *** and I independently    visualized and interpreted imaging which showed *** Additional history obtained from *** Previous records obtained and reviewed *** I consulted *** and discussed lab and imaging findings and discussed disposition.  Cardiac monitoring reviewed, *** Social determinants considered, *** Critical Interventions: ***  After the interventions stated above, I reevaluated the patient and found *** Admission and further testing considered, ***   {Document critical care time when appropriate:1} {Document review of labs and clinical decision tools ie heart score, Chads2Vasc2 etc:1}  {Document your independent review of radiology images, and any outside records:1} {Document your discussion with family members, caretakers, and with consultants:1} {Document social determinants of health affecting pt's care:1} {Document your decision making why or why not admission, treatments were needed:1} Final Clinical Impression(s) / ED Diagnoses Final diagnoses:  None    Rx / DC Orders ED Discharge Orders     None

## 2023-11-29 NOTE — Discharge Instructions (Addendum)
 You were seen in the emergency department for lower abdominal pain.  You had lab work urinalysis and a CAT scan that did not show an obvious explanation for your symptoms.  You had diverticulosis but no signs of diverticulitis.  You also had some gallstones in your gallbladder.  Please increase your fluid and fiber intake.  Follow-up with your primary care doctor.  Return to the emergency department if any fever or worsening symptoms.

## 2023-11-29 NOTE — ED Notes (Signed)
Patient denies abdominal pain at this time.

## 2023-11-29 NOTE — ED Triage Notes (Signed)
 Pt family reports lower left abdominal pain x 2 weeks. Pain is worse the last 2 days. No vomiting, no nausea. Pain gets worse when she walks. Pain comes and goes.

## 2023-11-29 NOTE — ED Notes (Signed)
RN assisted patient to bedside commode.

## 2023-12-02 ENCOUNTER — Encounter: Payer: Self-pay | Admitting: Medical

## 2023-12-02 ENCOUNTER — Ambulatory Visit (INDEPENDENT_AMBULATORY_CARE_PROVIDER_SITE_OTHER): Admitting: Medical

## 2023-12-02 VITALS — BP 147/70 | HR 76 | Resp 18 | Ht <= 58 in | Wt 119.4 lb

## 2023-12-02 DIAGNOSIS — E876 Hypokalemia: Secondary | ICD-10-CM | POA: Diagnosis not present

## 2023-12-02 DIAGNOSIS — R300 Dysuria: Secondary | ICD-10-CM

## 2023-12-02 DIAGNOSIS — R739 Hyperglycemia, unspecified: Secondary | ICD-10-CM | POA: Diagnosis not present

## 2023-12-02 DIAGNOSIS — K8020 Calculus of gallbladder without cholecystitis without obstruction: Secondary | ICD-10-CM | POA: Diagnosis not present

## 2023-12-02 DIAGNOSIS — R1033 Periumbilical pain: Secondary | ICD-10-CM

## 2023-12-02 LAB — CBC WITH DIFFERENTIAL/PLATELET
Basophils Absolute: 0 10*3/uL (ref 0.0–0.1)
Basophils Relative: 0.4 % (ref 0.0–3.0)
Eosinophils Absolute: 0.1 10*3/uL (ref 0.0–0.7)
Eosinophils Relative: 1 % (ref 0.0–5.0)
HCT: 38.2 % (ref 36.0–46.0)
Hemoglobin: 12.1 g/dL (ref 12.0–15.0)
Lymphocytes Relative: 28.6 % (ref 12.0–46.0)
Lymphs Abs: 2.6 10*3/uL (ref 0.7–4.0)
MCHC: 31.8 g/dL (ref 30.0–36.0)
MCV: 93.8 fl (ref 78.0–100.0)
Monocytes Absolute: 0.5 10*3/uL (ref 0.1–1.0)
Monocytes Relative: 5.7 % (ref 3.0–12.0)
Neutro Abs: 5.8 10*3/uL (ref 1.4–7.7)
Neutrophils Relative %: 64.3 % (ref 43.0–77.0)
Platelets: 330 10*3/uL (ref 150.0–400.0)
RBC: 4.07 Mil/uL (ref 3.87–5.11)
RDW: 14.9 % (ref 11.5–15.5)
WBC: 9.1 10*3/uL (ref 4.0–10.5)

## 2023-12-02 LAB — COMPREHENSIVE METABOLIC PANEL WITH GFR
ALT: 15 U/L (ref 0–35)
AST: 13 U/L (ref 0–37)
Albumin: 3.8 g/dL (ref 3.5–5.2)
Alkaline Phosphatase: 90 U/L (ref 39–117)
BUN: 23 mg/dL (ref 6–23)
CO2: 28 meq/L (ref 19–32)
Calcium: 9 mg/dL (ref 8.4–10.5)
Chloride: 105 meq/L (ref 96–112)
Creatinine, Ser: 0.64 mg/dL (ref 0.40–1.20)
GFR: 77.39 mL/min (ref >60.00)
Glucose, Bld: 118 mg/dL — ABNORMAL HIGH (ref 70–99)
Potassium: 3.8 meq/L (ref 3.5–5.1)
Sodium: 139 meq/L (ref 135–145)
Total Bilirubin: 0.7 mg/dL (ref 0.2–1.2)
Total Protein: 6.5 g/dL (ref 6.0–8.3)

## 2023-12-02 LAB — HEMOGLOBIN A1C: Hgb A1c MFr Bld: 6.7 % — ABNORMAL HIGH (ref 4.6–6.5)

## 2023-12-02 NOTE — Patient Instructions (Signed)
 Urinary symptoms. Dysuria Low-level dysuria with previous urine culture showing <10,000 bacteria, indicating no infection. Differential includes interstitial cystitis. - Obtain urine culture. - Consider urology referral if symptoms persist and culture is negative. Possible interstitial cystitis?  Elevated blood glucose Blood glucose elevated at 153 mg/dL. No recent hemoglobin A1c test performed. - Check hemoglobin A1c.  Hypokalemia Potassium level low, unclear if corrected during hospital visit. - Repeat potassium level.  Sigmoid diverticulosis CT scan showed scattered sigmoid diverticulosis without diverticulitis. No left lower quadrant pain. - Monitor for changes in pain, consider repeating CT scan or initiating antibiotics if symptoms suggest diverticulitis.  Cholelithiasis Gallstones present without symptoms of cholecystitis. Ultrasound recommended for evaluation. - Order ultrasound of gallbladder and liver. - Advise to report new symptoms such as right upper quadrant pain, nausea, or vomiting.  Follow up date to be determined after lab and imaging review

## 2023-12-02 NOTE — Progress Notes (Signed)
 Subjective:    Patient ID: Leslie Ward, female    DOB: May 19, 1931, 89 y.o.   MRN: [id number]  HPI Arrival date & time: 11/29/23  1648     History      Chief Complaint  Patient presents with   Abdominal Pain      Leslie Ward is a 89 y.o. female.  She is brought in by family member who is helping translate.  She declines formal interpreter.  She has had 1 week of left-sided lower abdominal pain.  Worse with walking.  She saw her primary care doctor who did some lab work and a urinalysis.  She traveled to [place]  and did okay but pain has been getting progressively worse over the last 2 days.  No change in her bowels no diarrhea or constipation.  She did have a little bit of discomfort with urination today.  No fevers or chills nausea vomiting.  No prior abdominal surgeries.   Labs (all labs ordered are listed, but only abnormal results are displayed)      Labs Reviewed  COMPREHENSIVE METABOLIC PANEL WITH GFR - Abnormal; Notable for the following components:      Result Value     Potassium 3.2 (*)      Glucose, Bld 153 (*)      Albumin 3.3 (*)      All other components within normal limits  LIPASE, BLOOD  CBC  URINALYSIS, ROUTINE W REFLEX MICROSCOPIC    Medical Decision Making Amount and/or Complexity of Data Reviewed Labs: ordered. Radiology: ordered.   Risk Prescription drug management.     This patient complains of left lower quadrant abdominal pain; this involves an extensive number of treatment Options and is a complaint that carries with it a high risk of complications and morbidity. The differential includes diverticulitis, colitis, UTI, musculoskeletal, constipation, renal colic   I ordered, reviewed and interpreted labs, which included CBC normal chemistries normal other than mildly low potassium elevated glucose, urinalysis without signs of infection I ordered medication IV fluids and reviewed PMP when indicated. I ordered imaging studies which  included CT abdomen and pelvis and I independently    visualized and interpreted imaging which showed no acute findings Additional history obtained from patient's daughter Previous records obtained and reviewed in epic including recent PCP notes Cardiac monitoring reviewed, normal sinus rhythm Social determinants considered, physically inactive Critical Interventions: None   After the interventions stated above, I reevaluated the patient and found patient to be resting comfortably in no distress Admission and further testing considered, no indications for admission.  Will need close follow-up with PCP.  No indications for antibiotics at this time.  Return instructions discussed       Leslie Ward is a 89 year old female who presents with persistent left-sided abdominal pain.  She has been experiencing ongoing left-sided abdominal pain that began during a recent trip to [place]  and has persisted for several days. Initially, the pain was more intense but has since decreased in severity. Currently, she describes the pain as low level and notes it is better than it was two days ago.  A CT scan of the abdomen revealed scattered sigmoid diverticulosis without signs of diverticulitis. Other differential diagnoses considered included colitis, urinary tract infection (UTI), muscle pain, constipation, or renal colic. However, the CT did not show inflammation or infection.  She reports experiencing pain during urination for the first time, which was not present before. A previous urine culture showed less than ten thousand  bacteria, indicating no infection at that time. No nausea, vomiting, or fever. Her laboratory workup included a normal pancreas protein level, a metabolic panel showing low sugar elevation, and normal kidney function. The white blood cell count was not elevated, and there was no anemia. Her potassium level was low, but it was corrected, and her blood sugar was slightly elevated at  153.  She has a history of gallstones but no current pain in the right upper quadrant. She reports having normal bowel movements recently, with no constipation noted. No pain is reported in the left lower quadrant where the diverticulosis is located.    Review of Systems  Constitutional:  Negative for chills, fatigue and fever.  HENT:  Negative for dental problem.   Respiratory:  Negative for chest tightness, shortness of breath and wheezing.   Cardiovascular:  Negative for chest pain and palpitations.  Gastrointestinal:  Positive for abdominal pain. Negative for blood in stool, nausea and vomiting.       See hpi  Genitourinary:  Positive for dysuria. Negative for difficulty urinating, flank pain, frequency, pelvic pain, urgency, vaginal discharge and vaginal pain.  Musculoskeletal:  Negative for back pain and joint swelling.  Skin:  Negative for rash.  Neurological:  Negative for dizziness, syncope, weakness and headaches.  Hematological:  Negative for adenopathy. Does not bruise/bleed easily.  Psychiatric/Behavioral:  Negative for behavioral problems and decreased concentration.     Past Medical History:  Diagnosis Date   Adhesive capsulitis of left shoulder 12/22/2018   Bilateral shoulder pain 08/24/2021   Cerebrovascular accident (CVA) (HCC) 10/25/2021   Cervical dystonia 03/08/2019   [facility]  approved diagnosis.   COVID 05/29/2021   Dystonia    Edema 02/11/2019   Ganglion cyst 08/25/2020   Glaucoma    Hyperlipidemia 02/11/2019   Hypertension    Hypothyroidism (acquired) 01/19/2019   Inflammatory arthritis 08/24/2021   Mixed incontinence 03/15/2020   Formatting of this note might be different from the original.  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  Added automatically from request for surgery [id number]   Urinary frequency 08/24/2021   Vertigo 12/01/2019     Social History   Socioeconomic History   Marital status: Widowed    Spouse name: Not on file   Number of children: 2   Years of education: 6 or 7 years of education   Highest education level: Not on file  Occupational History   Occupation: Retired  Tobacco Use   Smoking status: Never    Passive exposure: Past   Smokeless tobacco: Never   Tobacco comments:    Never smoker 07/18/23  Vaping Use   Vaping status: Never Used  Substance and Sexual Activity   Alcohol use: No   Drug use: No   Sexual activity: Not on file  Other Topics Concern   Not on file  Social History Narrative   Lives alone.   Right-handed.   No caffeine  use.   Social Drivers of Dispensing optician  Resource Strain: Low Risk  (10/01/2021)   Overall Financial Resource Strain (CARDIA)    Difficulty of Paying Living Expenses: Not hard at all  Food Insecurity: No Food Insecurity (06/17/2023)   Hunger Vital Sign    Worried About Running Out of Food in the Last Year: Never true    Ran Out of Food in the Last Year: Never true  Transportation Needs: No Transportation Needs (06/17/2023)   PRAPARE - Administrator, Civil Service (Medical): No    Lack of Transportation (Non-Medical): No  Physical Activity: Inactive (10/01/2021)   Exercise Vital Sign    Days of Exercise per Week: 0 days    Minutes of Exercise per Session: 0 min  Stress: No Stress Concern Present (10/01/2021)   [employer] of Occupational Health - Occupational Stress Questionnaire    Feeling of Stress : Not at all  Social Connections:  Moderately Isolated (12/18/2022)   Social Connection and Isolation Panel [NHANES]    Frequency of Communication with Friends and Family: More than three times a week    Frequency of Social Gatherings with Friends and Family: Twice a week    Attends Religious Services: More than 4 times per year    Active Member of [employer] or Organizations: No    Attends Banker Meetings: Never    Marital Status: Widowed  Intimate Partner Violence: Not At Risk (06/12/2023)   Humiliation, Afraid, Rape, and Kick questionnaire    Fear of Current or Ex-Partner: No    Emotionally Abused: No    Physically Abused: No    Sexually Abused: No    Past Surgical History:  Procedure Laterality Date   LOOP RECORDER INSERTION N/A 06/13/2023   Procedure: LOOP RECORDER INSERTION;  Surgeon: Lei Pump, MD;  Location: [facility];  Service: Cardiovascular;  Laterality: N/A;   NO PAST SURGERIES      Family History  Problem Relation Age of Onset   Stroke Mother    Heart attack Father    Heart Problems Brother    High blood pressure Maternal Grandmother    Liver disease Neg Hx    Colon cancer Neg Hx    Esophageal cancer Neg Hx     Allergies  Allergen Reactions   Crestor [Rosuvastatin Calcium ] Other (See Comments)    "caused me to go into kidney failure"    Current Outpatient Medications on File Prior to Visit  Medication Sig Dispense Refill   acetaminophen  (TYLENOL ) 650 MG CR tablet Take 650 mg by mouth every 8 (eight) hours as needed for pain.     amLODipine  (NORVASC ) 5 MG tablet 1 tab po bid 60 tablet 11   apixaban  (ELIQUIS ) 2.5 MG TABS tablet TAKE 1 TABLET BY MOUTH 2 TIMES A DAY 60 tablet 11   atorvastatin  (LIPITOR) 20 MG tablet Take 1 tablet (20 mg total) by mouth daily. 90 tablet 3   Calcium  Carbonate (CALCIUM  600 PO) Take 1 tablet by mouth daily.     Cholecalciferol (VITAMIN D) 125 MCG (5000 UT) CAPS Take 5,000 Units by mouth daily.     Cinnamon 500 MG capsule Take 1,000 mg by  mouth daily.     dorzolamide -timolol  (COSOPT ) 2-0.5 % ophthalmic solution Place 1 drop into both eyes 2 (two) times daily.     famotidine  (PEPCID ) 20 MG tablet Take 1 tablet (20 mg total) by mouth daily. 30 tablet 0   hydrocortisone  (ANUSOL -HC) 25 MG suppository Place 1 suppository (25 mg total) rectally 2 (  two) times daily. (Patient taking differently: Place 25 mg rectally as needed.) 12 suppository 0   latanoprost (XALATAN) 0.005 % ophthalmic solution Place 1 drop into both eyes every evening.     levothyroxine  (SYNTHROID ) 50 MCG tablet TAKE 1 TABLET BY MOUTH DAILY BEFORE BREAKFAST 30 tablet 0   meclizine  (ANTIVERT ) 25 MG tablet Take 0.5-1 tablets (12.5-25 mg total) by mouth 3 (three) times daily as needed for dizziness. 30 tablet 0   Menaquinone-7 (VITAMIN K2 PO) Take 1 tablet by mouth at bedtime.     metoprolol  tartrate (LOPRESSOR ) 50 MG tablet TAKE 1 TABLET BY MOUTH 2 TIMES A DAY 180 tablet 0   Phytosterol Esters (CHOLEST CARE PO) Take 1,600 mg by mouth daily.     Probiotic Product (PROBIOTIC DAILY PO) Take 1 tablet by mouth at bedtime.     vitamin C (ASCORBIC ACID) 500 MG tablet Take 500 mg by mouth daily.     Current Facility-Administered Medications on File Prior to Visit  Medication Dose Route Frequency Provider Last Rate Last Admin   incobotulinumtoxinA  (XEOMIN ) 100 units injection 100 Units  100 Units Intramuscular Q90 days Phebe Brasil, MD       incobotulinumtoxinA  (XEOMIN ) 100 units injection 100 Units  100 Units Intramuscular Q90 days Phebe Brasil, MD   100 Units at 05/03/22 2307    BP (!) 147/70 Comment: normal yesterday when home health.  Pulse 76   Resp 18   Ht 4\' 9"  (1.448 m)   Wt 119 lb 6.4 oz (54.2 kg)   SpO2 100%   BMI 25.84 kg/m        Objective:   Physical Exam  General Mental Status- Alert. General Appearance- Not in acute distress.   Skin General: Color- Normal Color. Moisture- Normal Moisture.  Neck Carotid Arteries- Normal color. Moisture- Normal  Moisture. No carotid bruits. No JVD.  Chest and Lung Exam Auscultation: Breath Sounds:-CTA  Cardiovascular Auscultation:Rythm- RRR Murmurs & Other Heart Sounds:Auscultation of the heart reveals- No Murmurs.  Abdomen Inspection:-Inspeection Normal. Palpation/Percussion:Note:No mass. Palpation and Percussion of the abdomen reveal- fant pain left of umbilicus and in suprapubic area, Non Distended + BS, no rebound or guarding.   Neurologic Cranial Nerve exam:- CN III-XII intact(No nystagmus), symmetric smile. Strength:- 5/5 equal and symmetric strength both upper and lower extremities.       Assessment & Plan:   Patient Instructions  Urinary symptoms. Dysuria Low-level dysuria with previous urine culture showing <10,000 bacteria, indicating no infection. Differential includes interstitial cystitis. - Obtain urine culture. - Consider urology referral if symptoms persist and culture is negative. Possible interstitial cystitis?  Elevated blood glucose Blood glucose elevated at 153 mg/dL. No recent hemoglobin A1c test performed. - Check hemoglobin A1c.  Hypokalemia Potassium level low, unclear if corrected during hospital visit. - Repeat potassium level.  Sigmoid diverticulosis CT scan showed scattered sigmoid diverticulosis without diverticulitis. No left lower quadrant pain. - Monitor for changes in pain, consider repeating CT scan or initiating antibiotics if symptoms suggest diverticulitis.  Cholelithiasis Gallstones present without symptoms of cholecystitis. Ultrasound recommended for evaluation. - Order ultrasound of gallbladder and liver. - Advise to report new symptoms such as right upper quadrant pain, nausea, or vomiting.  Follow up date to be determined after lab and imaging review

## 2023-12-03 LAB — URINE CULTURE
MICRO NUMBER:: 16359260
Result:: NO GROWTH
SPECIMEN QUALITY:: ADEQUATE

## 2023-12-04 ENCOUNTER — Ambulatory Visit (HOSPITAL_BASED_OUTPATIENT_CLINIC_OR_DEPARTMENT_OTHER)
Admission: RE | Admit: 2023-12-04 | Discharge: 2023-12-04 | Disposition: A | Discharge status: Home / Self Care | Source: Ambulatory Visit | Attending: Medical | Admitting: Medical

## 2023-12-04 DIAGNOSIS — R1033 Periumbilical pain: Secondary | ICD-10-CM

## 2023-12-04 DIAGNOSIS — R109 Unspecified abdominal pain: Secondary | ICD-10-CM | POA: Diagnosis not present

## 2023-12-04 DIAGNOSIS — K8020 Calculus of gallbladder without cholecystitis without obstruction: Secondary | ICD-10-CM | POA: Diagnosis not present

## 2023-12-04 NOTE — Progress Notes (Deleted)
 Office Visit Note  Patient: Leslie Ward             Date of Birth: 08/07/1931           MRN: [id number]             PCP: Francine Iron Referring: Francine Iron Visit Date: 12/17/2023   Subjective:  No chief complaint on file.   History of Present Illness: Leslie Ward is a 89 y.o. female here for follow up with osteoarthritis who presents with worsening arthritis symptoms after discontinuing meloxicam  due to stroke and requiring blood thinner use.    Previous HPI 09/16/2023 Leslie Ward is a 89 year old female with osteoarthritis who presents with worsening arthritis symptoms after discontinuing meloxicam  due to stroke and requiring blood thinner use.   Over the past few months, she has experienced worsening arthritis symptoms after discontinuing her previous medications due to a stroke and subsequent blood thinner therapy. The pain is primarily located in her arms and shoulders, significantly impacting her ability to dress and perform daily activities. The pain is pervasive, affecting her ability to lift her arms and perform tasks.   She manages her pain with Tylenol  Arthritis, taking one 650 mg pill in the morning and one at night. On days when the pain is more severe, she takes two pills. Despite maintaining her intake within safe limits, she notes that Tylenol  is not as effective as her previous medications. Steroids were stopped due to exacerbation of glaucoma previously. NSAIDs now stopped for [facility].   She experiences significant pain in her wrist, for which she uses a wrist brace and applies heat. She finds her walker heavy and difficult to use due to her wrist pain.   She is undergoing physical therapy to aid with joint movement and alleviate pain. She expresses fatigue and difficulty performing daily activities due to her pain.        Previous HPI 12/18/2022 Leslie Ward is a 89 y.o. female here for follow up for chronic joint pain with  possible seronegative inflammatory arthritis on low-dose prednisone  2.5 mg daily.  She had interval lab test with complete metabolic panel from December that was fine and blood pressure has been fine on the steroids.  Had concern due to ophthalmology exam with worsening intraocular pressure it was reported this may be steroid sensitive with possible risk to her vision with continued use.  So she is currently wondering whether there is any alternative as she had significant pain off of the prednisone  but does not want to risk blindness as potential side effect.  More recently she is also experiencing some right-sided flank pain she does not recall any sort of fall or injury to the area not having GI symptoms and no skin changes seen.  She has not noticed any dysuria or hematuria, has increased frequency but this is not entirely new for her does not have history of frequent urinary tract infections.   Previous HPI 06/12/22 Leslie Ward is a 89 y.o. female here for follow up for evaluation of joint pain involving neck, shoulders, and wrists with elevated sedimentation rate. She has tapered down the prednisone  dose down to 1 mg once daily which she continues and feels this is helpful. Her right knee has been painful particularly in the past 2 weeks. Pain is worst along the inside edge worst at night. She has not seen any visible swelling or erythema.   Previous HPI 01/09/2022 Leslie Ward is a 89 y.o. female  here for follow up for evaluation of joint pain involving neck, shoulders, and wrists with elevated sedimentation rate.  She had several complications in the past few months including a hospitalization for new small strokes.  Treatment for her glaucoma the symptoms are improving.  Since her last visit she took the prednisone  5 mg pretty consistently until last week.  She did not pick up the latest prescription awaiting plans from our visit but has not had a significant return of symptoms for about 1  week.   Previous HPI 08/24/2021 Leslie Ward is a 89 y.o. female here for evaluation of joint pain involving neck, shoulders, and wrists with elevated sedimentation rate.  She was ill with COVID in August of last year without severe acute complications and responded well to treatment with paxlovid .  However afterwards developed severe joint pain and stiffness in multiple areas worst affected in her neck shoulders and knees.  Her joint pains are worst first thing in the morning with a partial degree of improvement during the day.  She never noticed obvious visible swelling or overlying skin changes in affected areas. Besides the joint pains she is experiencing vertigo symptoms since her illness. She has some chronic osteoarthritis changes and dystonia with previous neck botox injections but nothing similar to the current symptoms. Imaging did not demonstrate any obvious related changes lab results showing very highly elevated ESR and CRP. She also developed probably UTI with E. Faecalis on urine culture which was treated during this time. Last month she resumed on prednisone  at 5 mg daily dose which was controlling symptoms well but stopped it this week prior to our visit. She notices symptoms come back within about 2 days after stopping any steroids.   Her son Leslie Ward is present at our visit today.   Labs reviewed 08/2021 ESR 75   04/2021 ANA neg RF neg ESR >130 CRP 152.1   No Rheumatology ROS completed.   PMFS History:  Patient Active Problem List   Diagnosis Date Noted   Gait abnormality 10/13/2023   Paroxysmal atrial fibrillation (HCC) 07/18/2023   Hypercoagulable state due to paroxysmal atrial fibrillation (HCC) 07/18/2023   Acute encephalopathy 06/11/2023   Osteoarthritis of right knee 12/18/2022   Glaucoma 11/26/2021   Cerebrovascular accident (CVA) (HCC) 10/25/2021   History of stroke 10/2021   Syncope 10/09/2021   Primary open angle glaucoma (POAG) of both eyes, moderate stage  09/12/2021   Sedimentation rate elevation 08/24/2021   Bilateral shoulder pain 08/24/2021   Inflammatory arthritis 08/24/2021   Urinary frequency 08/24/2021   COVID 05/29/2021   Post-COVID chronic fatigue 05/29/2021   Post-COVID chronic joint pain 05/29/2021   Post-COVID chronic muscle pain 05/29/2021   BPPV (benign paroxysmal positional vertigo), right 01/03/2021   Thyroid  disease    Ganglion cyst 08/25/2020   Mixed incontinence 03/15/2020   Urgency incontinence 03/15/2020   Blepharitis of upper and lower eyelids of both eyes 01/07/2020   Dermatochalasis of both upper eyelids 01/07/2020   Hypertensive retinopathy of both eyes 01/07/2020   Macular RPE mottling 01/07/2020   Meibomian gland dysfunction (MGD) of both eyes 01/07/2020   Vitreous syneresis of both eyes 01/07/2020   Vertigo 12/01/2019   Cervical dystonia 03/08/2019   Hyperlipidemia 02/11/2019   Edema 02/11/2019   Hypertension 01/19/2019   Type 2 diabetes mellitus (HCC) 01/19/2019   Hypothyroidism (acquired) 01/19/2019   Neck pain 01/19/2019   Dystonia 01/19/2019   Adhesive capsulitis of left shoulder 12/22/2018    Past Medical History:  Diagnosis  Date   Adhesive capsulitis of left shoulder 12/22/2018   Bilateral shoulder pain 08/24/2021   Cerebrovascular accident (CVA) (HCC) 10/25/2021   Cervical dystonia 03/08/2019   Xeomin  approved diagnosis.   COVID 05/29/2021   Dystonia    Edema 02/11/2019   Ganglion cyst 08/25/2020   Glaucoma    Hyperlipidemia 02/11/2019   Hypertension    Hypothyroidism (acquired) 01/19/2019   Inflammatory arthritis 08/24/2021   Mixed incontinence 03/15/2020   Formatting of this note might be different from the original.  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  Added automatically from request for surgery [id number]   Urinary frequency 08/24/2021   Vertigo 12/01/2019    Family History  Problem Relation Age of Onset   Stroke Mother    Heart attack Father    Heart Problems Brother    High blood pressure Maternal Grandmother    Liver disease Neg Hx    Colon cancer Neg Hx    Esophageal cancer Neg Hx    Past Surgical History:  Procedure Laterality Date   LOOP RECORDER INSERTION N/A 06/13/2023   Procedure: LOOP RECORDER INSERTION;  Surgeon: Lei Pump, MD;  Location: [facility];  Service: Cardiovascular;  Laterality: N/A;   NO PAST SURGERIES     Social History   Social History Narrative   Lives alone.   Right-handed.   No caffeine  use.   Immunization History  Administered Date(s) Administered   Fluad Quad(high Dose 65+) 06/11/2019, 06/13/2020   Influenza-Unspecified 07/12/2021, 06/12/2022   Moderna Covid-19 Vaccine Bivalent Booster 42yrs & up 06/12/2021   Moderna SARS-COV2 Booster Vaccination 07/21/2020   PFIZER(Purple Top)SARS-COV-2 Vaccination 12/20/2019, 01/10/2020     Objective: Vital Signs: There were no vitals taken for this visit.   Physical Exam   Musculoskeletal Exam: ***  CDAI Exam: CDAI Score: -- Patient Global: --; Provider Global: -- Swollen: --; Tender: -- Joint Exam 12/17/2023   No joint exam has been documented for this visit   There is currently no information documented on the homunculus. Go to the Rheumatology activity and complete the homunculus joint exam.  Investigation: No additional findings.  Imaging: CT ABDOMEN PELVIS W CONTRAST Result Date: 11/29/2023 CLINICAL DATA:  Left lower quadrant  abdominal pain EXAM: CT ABDOMEN AND PELVIS WITH CONTRAST TECHNIQUE: Multidetector CT imaging of the abdomen and pelvis was performed using the standard protocol following bolus administration of intravenous contrast. RADIATION DOSE REDUCTION: This exam was performed according to the departmental dose-optimization program which includes automated exposure control, adjustment of the mA and/or kV according to patient size and/or use of iterative reconstruction technique. CONTRAST:  75mL OMNIPAQUE  IOHEXOL  300 MG/ML  SOLN COMPARISON:  03/02/2020 FINDINGS: Lower chest: Trace left pleural effusion. Hepatobiliary: Cholelithiasis in the gallbladder neck. No evidence of acute cholecystitis. No biliary dilation. Unremarkable liver. Pancreas: Unremarkable. Spleen: Unremarkable. Adrenals/Urinary Tract: Normal adrenal glands. Bilateral cortical renal scarring. No urinary calculi or hydronephrosis. Unremarkable bladder. Stomach/Bowel: No bowel wall thickening or bowel obstruction. Stomach and  appendix are within normal limits. Enteric contrast throughout the small bowel and colon. There are few sigmoid diverticula without diverticulitis. Vascular/Lymphatic: Aortic atherosclerotic calcification. No lymphadenopathy. Reproductive: Uterus and bilateral adnexa are unremarkable. Other: No free intraperitoneal fluid or air. Musculoskeletal: No acute fracture.  Left sacral stimulator. IMPRESSION: 1. No acute abnormality in the abdomen or pelvis. 2. Cholelithiasis without evidence of acute cholecystitis. 3. Scattered sigmoid diverticulosis without diverticulitis. 4. Trace left pleural effusion. 5. Aortic Atherosclerosis (ICD10-I70.0). Electronically Signed   By: Rozell Cornet M.D.   On: 11/29/2023 22:18   CUP PACEART REMOTE DEVICE CHECK Result Date: 11/26/2023 ILR summary report received. Battery status OK. Normal device function. No new symptom, tachy, brady, or pause episodes. No new AF episodes. Monthly summary reports and ROV/PRN  LA, CVRS   Recent Labs: Lab Results  Component Value Date   WBC 9.1 12/02/2023   HGB 12.1 12/02/2023   PLT 330.0 12/02/2023   NA 139 12/02/2023   K 3.8 12/02/2023   CL 105 12/02/2023   CO2 28 12/02/2023   GLUCOSE 118 (H) 12/02/2023   BUN 23 12/02/2023   CREATININE 0.64 12/02/2023   BILITOT 0.7 12/02/2023   ALKPHOS 90 12/02/2023   AST 13 12/02/2023   ALT 15 12/02/2023   PROT 6.5 12/02/2023   ALBUMIN 3.8 12/02/2023   CALCIUM  9.0 12/02/2023   GFRAA >60 03/22/2020    Speciality Comments: No specialty comments available.  Procedures:  No procedures performed Allergies: Crestor [rosuvastatin calcium ]   Assessment / Plan:     Visit Diagnoses: No diagnosis found.  ***  Orders: No orders of the defined types were placed in this encounter.  No orders of the defined types were placed in this encounter.    Follow-Up Instructions: No follow-ups on file.   Glena Landau, RT  Note - This record has been created using [employer].  Chart creation errors have been sought, but may not always  have been located. Such creation errors do not reflect on  the standard of medical care.

## 2023-12-05 ENCOUNTER — Encounter: Payer: Self-pay | Admitting: Medical

## 2023-12-08 NOTE — Addendum Note (Signed)
 Addended by: Lott Rouleau A on: 12/08/2023 03:45 PM   Modules accepted: Orders

## 2023-12-08 NOTE — Progress Notes (Signed)
 [facility] Summary Report / Loop Recorder

## 2023-12-10 ENCOUNTER — Telehealth: Payer: Self-pay

## 2023-12-10 ENCOUNTER — Telehealth: Payer: Self-pay | Admitting: Medical

## 2023-12-10 NOTE — Telephone Encounter (Signed)
 Copied from CRM [id number]. Topic: Medicare AWV >> Dec 10, 2023 10:26 AM Juliana Ocean wrote: Reason for CRM: LVM 12/09/2023 to schedule AWV. Please schedule Virtual or Telehealth visits ONLY.   Rosalee Collins; Care Guide Ambulatory Clinical Support [place] [facility] Direct Dial: [phone]

## 2023-12-10 NOTE — Telephone Encounter (Signed)
 Copied from CRM [id number]. Topic: Clinical - Medication Question >> Dec 10, 2023  1:34 PM [name] wrote: Reason for CRM: Patient's on called in to see if Dr.Saguier or Dr.Saguier's nurse could prescribe the patient some antibiotics for the last office visit due to bladder pain. Son stated that he thinks the patient now as an infection. Son would like the nurse to give him a call back regarding this concern if possible, call back number is [phone].

## 2023-12-11 ENCOUNTER — Ambulatory Visit: Payer: Self-pay

## 2023-12-11 NOTE — Telephone Encounter (Signed)
 Copied from CRM [id number]. Topic: Clinical - Red Word Triage >> Dec 11, 2023 10:18 AM Shereese L wrote: [facility] that prompted transfer to Nurse Triage: possible bladder infection with severe lower abdominal pain  Chief Complaint: Abdominal pain Symptoms: Painful urination  Frequency: 2 weeks, onset of new symptoms yesterday Pertinent Negatives: Patient denies fever Disposition: [] ED /[] Urgent Care (no appt availability in office) / [x] Appointment(In office/virtual)/ []  [place] [facility]/ [] Home Care/ [] Refused Recommended Disposition /[] [facility] [place] [facility]/ []  Follow-up with PCP Additional Notes: Spoke to patient's son, Volanda Gruber (on [place]), on behalf of the patient. Patient has been experiencing abdominal pain for about 2 weeks. Patient was seen in ED and in office for symptoms. Son is reporting new onset of burning while urinating and stated pain has moved from the left side of her abdomen to under her belly button. Advised patient to be seen within 24 hours, due to new onset of symptoms. Scheduled for tomorrow morning with PCP. Son is also requesting PCP to move forward with referrals to GI and urology. Provided care advice and instructed patient to call back if symptoms worsen. Patient complied.   Reason for Disposition  Urinating more frequently than usual (i.e., frequency)  Answer Assessment - Initial Assessment Questions 1. LOCATION: "Where does it hurt?"      Below belly button today, started out on the left side earlier this week 2. RADIATION: "Does the pain shoot anywhere else?" (e.g., chest, back)     Below belly button today, started out on the left side earlier this week 3. ONSET: "When did the pain begin?" (e.g., minutes, hours or days ago)      2 weeks ago 5. PATTERN "Does the pain come and go, or is it constant?"    - If it comes and goes: "How long does it last?" "Do you have pain now?"     (Note: Comes and goes means the pain is intermittent. It goes away completely between  bouts.)    - If constant: "Is it getting better, staying the same, or getting worse?"      (Note: Constant means the pain never goes away completely; most serious pain is constant and gets worse.)      Intermittent- goes away when laying down 6. SEVERITY: "How bad is the pain?"  (e.g., Scale 1-10; mild, moderate, or severe)    - MILD (1-3): Doesn't interfere with normal activities, abdomen soft and not tender to touch.     - MODERATE (4-7): Interferes with normal activities or awakens from sleep, abdomen tender to touch.     - SEVERE (8-10): Excruciating pain, doubled over, unable to do any normal activities.       "Dull and achy" 7. RECURRENT SYMPTOM: "Have you ever had this type of stomach pain before?" If Yes, ask: "When was the last time?" and "What happened that time?"      Denies 8. CAUSE: "What do you think is causing the stomach pain?"     Unknown 9. RELIEVING/AGGRAVATING FACTORS: "What makes it better or worse?" (e.g., antacids, bending or twisting motion, bowel movement)     Pain when walking and urinating, pain decreases when laying down  10. OTHER SYMPTOMS: "Do you have any other symptoms?" (e.g., back pain, diarrhea, fever, urination pain, vomiting)       Painful urination since yesterday- new onset since being seen office, denies fever, urinary frequency due to increased water intake  Protocols used: Abdominal Pain - Female-A-AH, Urinary Symptoms-A-AH

## 2023-12-12 ENCOUNTER — Encounter: Payer: Self-pay | Admitting: Medical

## 2023-12-12 ENCOUNTER — Ambulatory Visit (INDEPENDENT_AMBULATORY_CARE_PROVIDER_SITE_OTHER): Admitting: Medical

## 2023-12-12 VITALS — BP 150/75 | HR 72 | Resp 18 | Ht <= 58 in | Wt 116.0 lb

## 2023-12-12 DIAGNOSIS — R109 Unspecified abdominal pain: Secondary | ICD-10-CM | POA: Diagnosis not present

## 2023-12-12 DIAGNOSIS — N898 Other specified noninflammatory disorders of vagina: Secondary | ICD-10-CM

## 2023-12-12 DIAGNOSIS — R102 Pelvic and perineal pain: Secondary | ICD-10-CM

## 2023-12-12 LAB — COMPREHENSIVE METABOLIC PANEL WITH GFR
ALT: 10 U/L (ref 0–35)
AST: 14 U/L (ref 0–37)
Albumin: 4 g/dL (ref 3.5–5.2)
Alkaline Phosphatase: 75 U/L (ref 39–117)
BUN: 20 mg/dL (ref 6–23)
CO2: 28 meq/L (ref 19–32)
Calcium: 9.2 mg/dL (ref 8.4–10.5)
Chloride: 102 meq/L (ref 96–112)
Creatinine, Ser: 0.74 mg/dL (ref 0.40–1.20)
GFR: 70.84 mL/min (ref >60.00)
Glucose, Bld: 103 mg/dL — ABNORMAL HIGH (ref 70–99)
Potassium: 4.2 meq/L (ref 3.5–5.1)
Sodium: 138 meq/L (ref 135–145)
Total Bilirubin: 1.3 mg/dL — ABNORMAL HIGH (ref 0.2–1.2)
Total Protein: 6.9 g/dL (ref 6.0–8.3)

## 2023-12-12 LAB — POC URINALSYSI DIPSTICK (AUTOMATED)
Bilirubin, UA: NEGATIVE
Blood, UA: NEGATIVE
Glucose, UA: NEGATIVE
Ketones, UA: NEGATIVE
Nitrite, UA: NEGATIVE
Protein, UA: NEGATIVE
Spec Grav, UA: 1.01 (ref 1.010–1.025)
Urobilinogen, UA: 0.2 U/dL
pH, UA: 6 (ref 5.0–8.0)

## 2023-12-12 LAB — CBC WITH DIFFERENTIAL/PLATELET
Basophils Absolute: 0 10*3/uL (ref 0.0–0.1)
Basophils Relative: 0.4 % (ref 0.0–3.0)
Eosinophils Absolute: 0.1 10*3/uL (ref 0.0–0.7)
Eosinophils Relative: 0.9 % (ref 0.0–5.0)
HCT: 40.4 % (ref 36.0–46.0)
Hemoglobin: 13.2 g/dL (ref 12.0–15.0)
Lymphocytes Relative: 29.8 % (ref 12.0–46.0)
Lymphs Abs: 2.5 10*3/uL (ref 0.7–4.0)
MCHC: 32.6 g/dL (ref 30.0–36.0)
MCV: 93.1 fl (ref 78.0–100.0)
Monocytes Absolute: 0.4 10*3/uL (ref 0.1–1.0)
Monocytes Relative: 5.2 % (ref 3.0–12.0)
Neutro Abs: 5.3 10*3/uL (ref 1.4–7.7)
Neutrophils Relative %: 63.7 % (ref 43.0–77.0)
Platelets: 340 10*3/uL (ref 150.0–400.0)
RBC: 4.34 Mil/uL (ref 3.87–5.11)
RDW: 15.3 % (ref 11.5–15.5)
WBC: 8.3 10*3/uL (ref 4.0–10.5)

## 2023-12-12 LAB — LIPASE: Lipase: 24 U/L (ref 11.0–59.0)

## 2023-12-12 MED ORDER — FLUCONAZOLE 150 MG PO TABS: 150.0000 mg | ORAL_TABLET | Freq: Every day | ORAL | 0 refills | Status: DC

## 2023-12-12 MED ORDER — CEPHALEXIN 500 MG PO CAPS: 500.0000 mg | ORAL_CAPSULE | Freq: Two times a day (BID) | ORAL | 0 refills | Status: DC

## 2023-12-12 NOTE — Progress Notes (Signed)
 Subjective:    Patient ID: Leslie Ward, female    DOB: 31-Jul-1931, 89 y.o.   MRN: [id number]  HPI  Last visit AVS below 12-02-2023.  "Urinary symptoms. Dysuria Low-level dysuria with previous urine culture showing <10,000 bacteria, indicating no infection. Differential includes interstitial cystitis. - Obtain urine culture. - Consider urology referral if symptoms persist and culture is negative. Possible interstitial cystitis?   Elevated blood glucose Blood glucose elevated at 153 mg/dL. No recent hemoglobin A1c test performed. - Check hemoglobin A1c.   Hypokalemia Potassium level low, unclear if corrected during hospital visit. - Repeat potassium level.   Sigmoid diverticulosis CT scan showed scattered sigmoid diverticulosis without diverticulitis. No left lower quadrant pain. - Monitor for changes in pain, consider repeating CT scan or initiating antibiotics if symptoms suggest diverticulitis.   Cholelithiasis Gallstones present without symptoms of cholecystitis. Ultrasound recommended for evaluation. - Order ultrasound of gallbladder and liver. - Advise to report new symptoms such as right upper quadrant pain, nausea, or vomiting."  Pt has some lingering symptoms and family wanted me to rx antibioitc. I had wanted to come in for recheck.  US  showed no  IMPRESSION: No visualized cholecystolithiasis or changes of acute cholecystitis.   Leslie Ward is a 89 year old female with diverticulosis who presents with suprapubic pain and dysuria.  She has been rare intermittent left-sided abdominal pain for approximately three weeks.. Last 2 days  more suprapubic. It is described as dull and intermittent, becoming more pronounced when standing and alleviating when lying down. She has undergone multiple diagnostic evaluations, including CT scans and ultrasounds. Seen in ED and in our office.  She reports a burning sensation during urination, described as 'arde' in Spanish,  indicating dysuria. This symptom has been present since yesterday and is ongoing. No constipation or diarrhea is reported. No fever, no chills or sweats.  Her past medical history includes diverticulosis, specifically in the sigmoid colon, which is relevant given the location occasional pain though not in this area today. She has not had any abdominal surgeries except for C-sections.  She is currently taking amlodipine  twice a day for blood pressure management. Her blood pressure was noted to be elevated at 150/75, and she is also on Lopressor  at 50 mg twice a day.  Review of Systems  Constitutional:  Negative for chills, fatigue and fever.  Respiratory:  Negative for chest tightness, shortness of breath and wheezing.   Cardiovascular:  Negative for chest pain and palpitations.  Gastrointestinal:  Positive for abdominal pain. Negative for diarrhea and vomiting.  Musculoskeletal:  Negative for back pain, myalgias and neck pain.  Skin:  Negative for pallor, rash and wound.  Psychiatric/Behavioral:  Negative for behavioral problems and dysphoric mood.         Objective:   Physical Exam  General Mental Status- Alert. General Appearance- Not in acute distress.   Skin General: Color- Normal Color. Moisture- Normal Moisture.  Neck Carotid Arteries- Normal color. Moisture- Normal Moisture. No carotid bruits. No JVD.  Chest and Lung Exam Auscultation: Breath Sounds:-Normal.  Cardiovascular Auscultation:Rythm- Regular. Murmurs & Other Heart Sounds:Auscultation of the heart reveals- No Murmurs.  Abdomen Inspection:-Inspeection Normal. Palpation/Percussion:Note:No mass. Palpation and Percussion of the abdomen reveal- faint suprapubic Tender(no llq pain), Non Distended + BS, no rebound or guarding.   Neurologic Cranial Nerve exam:- CN III-XII intact(No nystagmus), symmetric smile. Strength:- 5/5 equal and symmetric strength both upper and lower extremities.   Back- no cva pain.     Assessment & Plan:  Possible urinary tract infection Burning sensation during urination suggests UTI. Differential includes interstitial cystitis and maybe fungal infection. Urine culture needed to identify pathogens. - Obtain urine sample for urinalysis and culture. - Prescribe Keflex 500 mg twice daily for three days pending culture results.(start tomorrow). Son expressed concern for possible fungal infection. Decided to rx difulcan 1 tab dose for one day. Then advised start keflex tomorrow - Extend antibiotics if culture positive. - Consider urologist referral if culture and blood work negative.  Hypertension Blood pressure 150/75 mmHg, slightly elevated. On amlodipine  and another antihypertensive. Pain may contribute to elevation. - Check blood pressure daily for three days and update on Monday. - Consider adding third antihypertensive if elevated by Wednesday.  Diverticulosis of sigmoid colon -monitor for any recurren llq pain. if does occur and constant nofify  us . Then rx cipro, flagyl and repeat CT.  Follow up date to be determined after lab review.  Time spent with patient today was 41  minutes which consisted of chart review, discussing diagnosis, work up ,treatment and documentation.   Jennier Schissler, PA-C

## 2023-12-12 NOTE — Patient Instructions (Signed)
 Possible urinary tract infection Burning sensation during urination suggests UTI. Differential includes interstitial cystitis and maybe fungal infection. Urine culture needed to identify pathogens. - Obtain urine sample for urinalysis and culture. - Prescribe Keflex 500 mg twice daily for three days pending culture results.(start tomorrow). Son expressed concern for possible fungal infection. Decided to rx difulcan 1 tab dose for one day. Then advised start keflex tomorrow - Extend antibiotics if culture positive. - Consider urologist referral if culture and blood work negative.  Hypertension Blood pressure 150/75 mmHg, slightly elevated. On amlodipine  and another antihypertensive. Pain may contribute to elevation. - Check blood pressure daily for three days and update on Monday. - Consider adding third antihypertensive if elevated by Wednesday.  Diverticulosis of sigmoid colon -monitor for any recurren llq pain. if does occur and constant nofify  us . Then rx cipro, flagyl and repeat CT.  Follow up date to be determined after lab review.

## 2023-12-13 ENCOUNTER — Encounter: Payer: Self-pay | Admitting: Medical

## 2023-12-13 LAB — URINE CULTURE
MICRO NUMBER:: 16406104
Result:: NO GROWTH
SPECIMEN QUALITY:: ADEQUATE

## 2023-12-15 NOTE — Addendum Note (Signed)
 Addended by: Serafina Damme on: 12/15/2023 04:51 PM   Modules accepted: Orders

## 2023-12-16 ENCOUNTER — Other Ambulatory Visit (HOSPITAL_BASED_OUTPATIENT_CLINIC_OR_DEPARTMENT_OTHER)

## 2023-12-17 ENCOUNTER — Ambulatory Visit: Payer: 59 | Admitting: Internal Medicine

## 2023-12-17 ENCOUNTER — Ambulatory Visit: Admitting: Cardiology

## 2023-12-17 DIAGNOSIS — M1990 Unspecified osteoarthritis, unspecified site: Secondary | ICD-10-CM

## 2023-12-17 DIAGNOSIS — G8929 Other chronic pain: Secondary | ICD-10-CM

## 2023-12-27 ENCOUNTER — Other Ambulatory Visit: Payer: Self-pay | Admitting: Medical

## 2023-12-30 ENCOUNTER — Ambulatory Visit (INDEPENDENT_AMBULATORY_CARE_PROVIDER_SITE_OTHER): Payer: Medicare HMO

## 2023-12-30 DIAGNOSIS — I639 Cerebral infarction, unspecified: Secondary | ICD-10-CM

## 2023-12-31 LAB — CUP PACEART REMOTE DEVICE CHECK
Date Time Interrogation Session: 20250520095720
Implantable Pulse Generator Implant Date: 20241101

## 2024-01-01 ENCOUNTER — Ambulatory Visit: Payer: Self-pay | Admitting: Cardiology

## 2024-01-08 DIAGNOSIS — N3281 Overactive bladder: Secondary | ICD-10-CM | POA: Diagnosis not present

## 2024-01-08 NOTE — Progress Notes (Signed)
 [facility] - Urogynecology Follow-Up Visit  HPI:  [name] is an 89 y.o. Spanish speaking F here today with her son with PMHx significant for Hypertension, hyperlipidemia, history of CVA, atrial fibrillation, vertico, DM2 with last A1C 6.1 (2022) who was last seen by Toyin 12/18/23 for GUSM, MUI with interstim divide in place (placed 04/13/2020).   At her last visit, she was reporting periumbilical and pelvic pain with reassuring workup in the ED. Discomfort improved with turning off device at home. Device evaluated in clinic on 12/18/23 and found to be > 6.0 V. Program adjusted to #5 with sensation in the vagian at 2.1V and patient and family were counseled against too frequent increases in stimulation at home. Peter w/ [employer] was supposed to check on patient in the interim; per her and her son this pain has resolved.   History of Present Illness The patient presents for evaluation of urinary frequency and urgency.  She has been experiencing sudden pain in the bladder region, which led to multiple visits to the emergency room and consultations with her primary care physician. Despite these efforts, the cause of the pain remained undetermined. It was suggested that she deactivate her stimulator, and upon doing so, the pain subsided. The nurse practitioner subsequently adjusted the stimulator settings, resulting in the resolution of her symptoms. She reports experiencing urinary frequency and urgency. She also mentions that she can withhold urination for a few days, but once she uses the bathroom, she experiences immediate relief. She has been using estrogen cream vaginally on an intermittent basis.  GYNECOLOGICAL HISTORY: Gynecology History discussed  Past Medical History:  Diagnosis Date  . Glaucoma   . Hypertension   . Macular degeneration   . Stroke    (CMD)   . Vertigo     Past Surgical History:  Procedure Laterality Date  . CATARACT EXTRACTION Bilateral 2017    Procedure: CATARACT EXTRACTION; [place]   . CATARACT EXTRACTION Right 02/22/2019   Procedure: YAG CAPSULOTOMY  . CATARACT EXTRACTION Left 03/08/2019   Procedure: YAG CAPSULOTOMY  . INTERSTIM GENERATOR PLACEMENT N/A 04/13/2020   Procedure: RENNA PLACEMENT STAGE 1 & 2-[employer];  Surgeon: Alberta Jamee Batman, MD;  Location: [facility] OUTPATIENT OR;  Service: Gynecology;  Laterality: N/A;  . INTERSTIM IMPLANT REMOVAL N/A 04/13/2020   Procedure: RENNA IMPLANT REMOVAL;  Surgeon: Alberta Jamee Batman, MD;  Location: [facility] OUTPATIENT OR;  Service: Gynecology;  Laterality: N/A;    Review of Systems:  Review of Systems - Negative except as per HPI    EXAM Vitals:   01/08/24 1333 01/08/24 1336  BP: (!) 131/52 (!) 125/48  Pulse: 63   Temp: 98.7 F (37.1 C)   TempSrc: Temporal   SpO2: 96%   Weight: 52.2 kg (115 lb)   Height: 1.346 m (4' 5)      Urinalysis: Urinalysis results:  Lab Results  Component Value Date   COLORU Yellow 12/18/2023   CLARITYU Clear 12/18/2023   SPECGRAV 1.020 12/18/2023   PHUR 5.5 12/18/2023   PROTEINUA Negative 12/18/2023   GLUCOSEU Negative 12/18/2023   KETONEU Negative 12/18/2023   BILIRUBINUR Negative 12/18/2023   BLOODU Trace (A) 12/18/2023   UROBILINOGEN 0.2 12/18/2023   LEUKOUA Negative 12/18/2023    PVR: 0 ml  Physical Exam  General appearance: well nourished, well hydrated, no acute distress Neck: supple, no masses, trachea midline Respiratory:  Respirations even, unlabored.    ASSESSMENT: 89 y.o. with PMHx of  Hypertension, hyperlipidemia, history of CVA, atrial fibrillation, vertico,  DM2 with last A1C 6.1 (2022)  and current diagnoses of  1) Genitourinary syndrome of menopause 2) Refractory overactive bladder with urgency incontinence with InterStim in place with dead battery 3) Stress urinary incontinence 4) Bowel accidents  Assessment & Plan Urinary frequency and urgency. Initiate antibiotic regimen for 5 days to  address bacterial overgrowth in the urine noted in prior urine culture. Provide prescription for estrogen cream with instructions to apply at least 3 times per week. Advise monitoring and adjusting caffeine  intake as necessary. Report any changes in urinary frequency and urgency following treatment. Further investigations warranted if symptoms persist to rule out urinary tract infection (UTI).

## 2024-01-09 NOTE — Progress Notes (Signed)
 [facility] Summary Report / Loop Recorder

## 2024-01-09 NOTE — Addendum Note (Signed)
 Addended by: Lott Rouleau A on: 01/09/2024 01:25 PM   Modules accepted: Orders

## 2024-01-14 NOTE — Progress Notes (Deleted)
 Office Visit Note  Patient: Leslie Ward             Date of Birth: 01-Dec-1930           MRN: [id number]             PCP: Francine Iron Referring: Francine Iron Visit Date: 01/28/2024   Subjective:  No chief complaint on file.   History of Present Illness: Leslie Ward is a 89 y.o. female here for follow up with osteoarthritis who presents with worsening arthritis symptoms after discontinuing meloxicam  due to stroke and requiring blood thinner use.    Previous HPI 09/16/2023 Leslie Ward is a 89 year old female with osteoarthritis who presents with worsening arthritis symptoms after discontinuing meloxicam  due to stroke and requiring blood thinner use.   Over the past few months, she has experienced worsening arthritis symptoms after discontinuing her previous medications due to a stroke and subsequent blood thinner therapy. The pain is primarily located in her arms and shoulders, significantly impacting her ability to dress and perform daily activities. The pain is pervasive, affecting her ability to lift her arms and perform tasks.   She manages her pain with Tylenol  Arthritis, taking one 650 mg pill in the morning and one at night. On days when the pain is more severe, she takes two pills. Despite maintaining her intake within safe limits, she notes that Tylenol  is not as effective as her previous medications. Steroids were stopped due to exacerbation of glaucoma previously. NSAIDs now stopped for [facility]   She experiences significant pain in her wrist, for which she uses a wrist brace and applies heat. She finds her walker heavy and difficult to use due to her wrist pain.   She is undergoing physical therapy to aid with joint movement and alleviate pain. She expresses fatigue and difficulty performing daily activities due to her pain.        Previous HPI 12/18/2022 Leslie Ward is a 89 y.o. female here for follow up for chronic joint pain with  possible seronegative inflammatory arthritis on low-dose prednisone  2.5 mg daily.  She had interval lab test with complete metabolic panel from December that was fine and blood pressure has been fine on the steroids.  Had concern due to ophthalmology exam with worsening intraocular pressure it was reported this may be steroid sensitive with possible risk to her vision with continued use.  So she is currently wondering whether there is any alternative as she had significant pain off of the prednisone  but does not want to risk blindness as potential side effect.  More recently she is also experiencing some right-sided flank pain she does not recall any sort of fall or injury to the area not having GI symptoms and no skin changes seen.  She has not noticed any dysuria or hematuria, has increased frequency but this is not entirely new for her does not have history of frequent urinary tract infections.   Previous HPI 06/12/22 Leslie Ward is a 89 y.o. female here for follow up for evaluation of joint pain involving neck, shoulders, and wrists with elevated sedimentation rate. She has tapered down the prednisone  dose down to 1 mg once daily which she continues and feels this is helpful. Her right knee has been painful particularly in the past 2 weeks. Pain is worst along the inside edge worst at night. She has not seen any visible swelling or erythema.   Previous HPI 01/09/2022 Leslie Ward is a 89 y.o. female  here for follow up for evaluation of joint pain involving neck, shoulders, and wrists with elevated sedimentation rate.  She had several complications in the past few months including a hospitalization for new small strokes.  Treatment for her glaucoma the symptoms are improving.  Since her last visit she took the prednisone  5 mg pretty consistently until last week.  She did not pick up the latest prescription awaiting plans from our visit but has not had a significant return of symptoms for about 1  week.   Previous HPI 08/24/2021 Leslie Ward is a 89 y.o. female here for evaluation of joint pain involving neck, shoulders, and wrists with elevated sedimentation rate.  She was ill with COVID in August of last year without severe acute complications and responded well to treatment with paxlovid .  However afterwards developed severe joint pain and stiffness in multiple areas worst affected in her neck shoulders and knees.  Her joint pains are worst first thing in the morning with a partial degree of improvement during the day.  She never noticed obvious visible swelling or overlying skin changes in affected areas. Besides the joint pains she is experiencing vertigo symptoms since her illness. She has some chronic osteoarthritis changes and dystonia with previous neck botox injections but nothing similar to the current symptoms. Imaging did not demonstrate any obvious related changes lab results showing very highly elevated ESR and CRP. She also developed probably UTI with E. Faecalis on urine culture which was treated during this time. Last month she resumed on prednisone  at 5 mg daily dose which was controlling symptoms well but stopped it this week prior to our visit. She notices symptoms come back within about 2 days after stopping any steroids.   Her son Volanda Gruber is present at our visit today.   Labs reviewed 08/2021 ESR 75   04/2021 ANA neg RF neg ESR >130 CRP 152.1   No Rheumatology ROS completed.   PMFS History:  Patient Active Problem List   Diagnosis Date Noted   Gait abnormality 10/13/2023   Paroxysmal atrial fibrillation (HCC) 07/18/2023   Hypercoagulable state due to paroxysmal atrial fibrillation (HCC) 07/18/2023   Acute encephalopathy 06/11/2023   Osteoarthritis of right knee 12/18/2022   Glaucoma 11/26/2021   Cerebrovascular accident (CVA) (HCC) 10/25/2021   History of stroke 10/2021   Stroke (HCC) 10/2021   Syncope 10/09/2021   Primary open angle glaucoma (POAG) of  both eyes, moderate stage 09/12/2021   Sedimentation rate elevation 08/24/2021   Bilateral shoulder pain 08/24/2021   Inflammatory arthritis 08/24/2021   Urinary frequency 08/24/2021   COVID 05/29/2021   Post-COVID chronic fatigue 05/29/2021   Post-COVID chronic joint pain 05/29/2021   Post-COVID chronic muscle pain 05/29/2021   BPPV (benign paroxysmal positional vertigo), right 01/03/2021   Thyroid  disease    Ganglion cyst 08/25/2020   Mixed incontinence 03/15/2020   Urgency incontinence 03/15/2020   Blepharitis of upper and lower eyelids of both eyes 01/07/2020   Dermatochalasis of both upper eyelids 01/07/2020   Hypertensive retinopathy of both eyes 01/07/2020   Macular RPE mottling 01/07/2020   Meibomian gland dysfunction (MGD) of both eyes 01/07/2020   Vitreous syneresis of both eyes 01/07/2020   Vertigo 12/01/2019   Cervical dystonia 03/08/2019   Hyperlipidemia 02/11/2019   Edema 02/11/2019   Hypertension 01/19/2019   Type 2 diabetes mellitus (HCC) 01/19/2019   Hypothyroidism (acquired) 01/19/2019   Neck pain 01/19/2019   Dystonia 01/19/2019   Adhesive capsulitis of left shoulder 12/22/2018  Past Medical History:  Diagnosis Date   Acute encephalopathy 06/11/2023   Adhesive capsulitis of left shoulder 12/22/2018   Bilateral shoulder pain 08/24/2021   Blepharitis of upper and lower eyelids of both eyes 01/07/2020   BPPV (benign paroxysmal positional vertigo), right 01/03/2021   Cerebrovascular accident (CVA) (HCC) 10/25/2021   Cervical dystonia 03/08/2019   Xeomin  approved diagnosis.   COVID 05/29/2021   Dermatochalasis of both upper eyelids 01/07/2020   Dystonia    Edema 02/11/2019   Gait abnormality 10/13/2023   Ganglion cyst 08/25/2020   Glaucoma    History of stroke 10/2021   Hypercoagulable state due to paroxysmal atrial fibrillation (HCC) 07/18/2023   Hyperlipidemia 02/11/2019   Hypertension    Hypertensive retinopathy of both eyes 01/07/2020    Hypothyroidism (acquired) 01/19/2019   Inflammatory arthritis 08/24/2021   Macular RPE mottling 01/07/2020   Meibomian gland dysfunction (MGD) of both eyes 01/07/2020   Mixed incontinence 03/15/2020   Formatting of this note might be different from the original.  Added automatically from request for surgery [id number]   Neck pain 01/19/2019   Osteoarthritis of right knee 12/18/2022   Paroxysmal atrial fibrillation (HCC) 07/18/2023   Post-COVID chronic fatigue 05/29/2021   Post-COVID chronic joint pain 05/29/2021   Post-COVID chronic muscle pain 05/29/2021   Primary open angle glaucoma (POAG) of both eyes, moderate stage 09/12/2021   Sedimentation rate elevation 08/24/2021   Stroke (HCC) 10/2021   Stroke (HCC) 05/2023   Syncope 10/09/2021   Thyroid  disease    Type 2 diabetes mellitus (HCC) 01/19/2019   Urgency incontinence 03/15/2020   Formatting of this note might be different from the original.  Added automatically from request for surgery [id number]   Urinary frequency 08/24/2021   Vertigo 12/01/2019   Vitreous syneresis of both eyes 01/07/2020    Family History  Problem Relation Age of Onset   Stroke Mother    Heart attack Father    Heart Problems Brother    High blood pressure Maternal Grandmother    Liver disease Neg Hx    Colon cancer Neg Hx    Esophageal cancer Neg Hx    Past Surgical History:  Procedure Laterality Date   LOOP RECORDER INSERTION N/A 06/13/2023   Procedure: LOOP RECORDER INSERTION;  Surgeon: Lei Pump, MD;  Location: [facility] INVASIVE CV LAB;  Service: Cardiovascular;  Laterality: N/A;   Social History   Social History Narrative   Lives alone.   Right-handed.   No caffeine  use.   Immunization History  Administered Date(s) Administered   Fluad Quad(high Dose 65+) 06/11/2019, 06/13/2020   Influenza-Unspecified 07/12/2021, 06/12/2022   Moderna Covid-19 Vaccine Bivalent Booster 70yrs & up 06/12/2021   Moderna SARS-COV2 Booster Vaccination  07/21/2020   PFIZER(Purple Top)SARS-COV-2 Vaccination 12/20/2019, 01/10/2020     Objective: Vital Signs: There were no vitals taken for this visit.   Physical Exam   Musculoskeletal Exam: ***  CDAI Exam: CDAI Score: -- Patient Global: --; Provider Global: -- Swollen: --; Tender: -- Joint Exam 01/28/2024   No joint exam has been documented for this visit   There is currently no information documented on the homunculus. Go to the Rheumatology activity and complete the homunculus joint exam.  Investigation: No additional findings.  Imaging: CUP PACEART REMOTE DEVICE CHECK Result Date: 12/31/2023 ILR summary report received. Battery status OK. Normal device function. No new symptom, tachy, brady, or pause episodes. 3 new AF episodes, longest duration 2hrs , controlled rates, burden 0.6%, Eliquis  per PA report.  Monthly summary reports and ROV/PRN LA, CVRS   Recent Labs: Lab Results  Component Value Date   WBC 8.3 12/12/2023   HGB 13.2 12/12/2023   PLT 340.0 12/12/2023   NA 138 12/12/2023   K 4.2 12/12/2023   CL 102 12/12/2023   CO2 28 12/12/2023   GLUCOSE 103 (H) 12/12/2023   BUN 20 12/12/2023   CREATININE 0.74 12/12/2023   BILITOT 1.3 (H) 12/12/2023   ALKPHOS 75 12/12/2023   AST 14 12/12/2023   ALT 10 12/12/2023   PROT 6.9 12/12/2023   ALBUMIN 4.0 12/12/2023   CALCIUM  9.2 12/12/2023   GFRAA >60 03/22/2020    Speciality Comments: No specialty comments available.  Procedures:  No procedures performed Allergies: Crestor [rosuvastatin calcium ]   Assessment / Plan:     Visit Diagnoses: No diagnosis found.  ***  Orders: No orders of the defined types were placed in this encounter.  No orders of the defined types were placed in this encounter.    Follow-Up Instructions: No follow-ups on file.   Glena Landau, RT  Note - This record has been created using [employer].  Chart creation errors have been sought, but may not always  have been  located. Such creation errors do not reflect on  the standard of medical care.

## 2024-01-16 ENCOUNTER — Ambulatory Visit: Attending: Cardiology | Admitting: Cardiology

## 2024-01-28 ENCOUNTER — Ambulatory Visit: Admitting: Internal Medicine

## 2024-01-28 DIAGNOSIS — M1990 Unspecified osteoarthritis, unspecified site: Secondary | ICD-10-CM

## 2024-01-28 DIAGNOSIS — G8929 Other chronic pain: Secondary | ICD-10-CM

## 2024-01-30 ENCOUNTER — Ambulatory Visit (INDEPENDENT_AMBULATORY_CARE_PROVIDER_SITE_OTHER): Payer: Self-pay

## 2024-01-30 ENCOUNTER — Other Ambulatory Visit: Payer: Self-pay | Admitting: Medical

## 2024-01-30 DIAGNOSIS — I639 Cerebral infarction, unspecified: Secondary | ICD-10-CM | POA: Diagnosis not present

## 2024-02-02 LAB — CUP PACEART REMOTE DEVICE CHECK
Date Time Interrogation Session: 20250620095720
Implantable Pulse Generator Implant Date: 20241101

## 2024-02-04 ENCOUNTER — Ambulatory Visit: Payer: Self-pay | Admitting: Cardiology

## 2024-02-20 NOTE — Progress Notes (Signed)
 [facility] Summary Report / Loop Recorder

## 2024-02-20 NOTE — Addendum Note (Signed)
 Addended by: VICCI SELLER A on: 02/20/2024 09:59 AM   Modules accepted: Orders

## 2024-02-22 ENCOUNTER — Encounter: Payer: Self-pay | Admitting: Medical

## 2024-02-23 ENCOUNTER — Ambulatory Visit: Admitting: Medical

## 2024-02-23 DIAGNOSIS — H04123 Dry eye syndrome of bilateral lacrimal glands: Secondary | ICD-10-CM | POA: Diagnosis not present

## 2024-02-23 DIAGNOSIS — H40021 Open angle with borderline findings, high risk, right eye: Secondary | ICD-10-CM | POA: Diagnosis not present

## 2024-02-23 DIAGNOSIS — H401122 Primary open-angle glaucoma, left eye, moderate stage: Secondary | ICD-10-CM | POA: Diagnosis not present

## 2024-02-23 NOTE — Telephone Encounter (Signed)
 Initial Comment Caller says that his mother has had dizzy spells on and off the past couple of days, that last under a minute. She rests and then feels fine. She does not feel faint. Translation No Nurse Assessment Nurse: Cox, RN, Eileen Date/Time (Eastern Time): 02/23/2024 9:45:42 AM Confirm and document reason for call. If symptomatic, describe symptoms. ---Caller states the pt has been having dizzy spells that last 2-3 seconds. Pt had these issues before and she has been checked out by the doctor. Pt usually feels dizziness when she is already standing, for example when she is cooking, she will feel dizzy then she will need to sit down. No new medication or food. Pt is on blood thinners and a heart monitor, pt had 3-4 advances of AFIB. Does the patient have any new or worsening symptoms? ---Yes Will a triage be completed? ---Yes Related visit to physician within the last 2 weeks? ---No Does the PT have any chronic conditions? (i.e. diabetes, asthma, this includes High risk factors for pregnancy, etc.) ---Yes List chronic conditions. ---afib Is this a behavioral health or substance abuse call? ---No Guidelines Guideline Title Affirmed Question Affirmed Notes Nurse Date/Time (Eastern Time) Dizziness - Lightheadedness [1] Dizziness caused by heat exposure, sudden Cox, RN, Eileen 02/23/2024 9:50:34 AM PLEASE NOTE: All timestamps contained within this report are represented as [place] Standard Time. CONFIDENTIALTY NOTICE: This fax transmission is intended only for the addressee. It contains information that is legally privileged, confidential or otherwise protected from use or disclosure. If you are not the intended recipient, you are strictly prohibited from reviewing, disclosing, copying using or disseminating any of this information or taking any action in reliance on or regarding this information. If you have received this fax in error, please notify us  immediately by  telephone so that we can arrange for its return to us . Phone: [phone], Toll-Free: [phone], Fax: [phone] [email or web address] May 27, 1931 Page: 1 of2 CallId: [id number] Guidelines Guideline Title Affirmed Question Affirmed Notes Nurse Date/Time Titus Time) standing, or poor fluid intake AND [2] no improvement after 2 hours of rest and fluids Disp. Time Titus Time) Disposition Final User 02/23/2024 9:55:11 AM See HCP within 4 Hours (or PCP triage) Yes Cox, RN, Eileen Final Disposition 02/23/2024 9:55:11 AM See HCP within 4 Hours (or PCP triage) Yes Cox, RN, Eileen Caller Disagree/Comply Comply Caller Understands Yes PreDisposition Call Doctor Care Advice Given Per Guideline SEE HCP (OR PCP TRIAGE) WITHIN 4 HOURS: DRINK FLUIDS: * This will improve hydration and blood sugar levels. * This will improve blood flow through the body and to the brain. * You pass out (faint) or are too weak to stand * You become worse Referrals REFERRED TO PCP OFFICE

## 2024-02-23 NOTE — Telephone Encounter (Signed)
 Appt scheduled today

## 2024-02-24 ENCOUNTER — Ambulatory Visit (INDEPENDENT_AMBULATORY_CARE_PROVIDER_SITE_OTHER): Admitting: Medical

## 2024-02-24 ENCOUNTER — Encounter: Payer: Self-pay | Admitting: Medical

## 2024-02-24 VITALS — BP 147/75 | HR 80 | Temp 98.0°F | Resp 18 | Ht <= 58 in | Wt 117.0 lb

## 2024-02-24 DIAGNOSIS — I480 Paroxysmal atrial fibrillation: Secondary | ICD-10-CM | POA: Diagnosis not present

## 2024-02-24 DIAGNOSIS — L089 Local infection of the skin and subcutaneous tissue, unspecified: Secondary | ICD-10-CM | POA: Diagnosis not present

## 2024-02-24 DIAGNOSIS — I10 Essential (primary) hypertension: Secondary | ICD-10-CM | POA: Diagnosis not present

## 2024-02-24 DIAGNOSIS — R42 Dizziness and giddiness: Secondary | ICD-10-CM | POA: Diagnosis not present

## 2024-02-24 DIAGNOSIS — K649 Unspecified hemorrhoids: Secondary | ICD-10-CM

## 2024-02-24 LAB — CBC WITH DIFFERENTIAL/PLATELET
Basophils Absolute: 0 K/uL (ref 0.0–0.1)
Basophils Relative: 0.2 % (ref 0.0–3.0)
Eosinophils Absolute: 0.1 K/uL (ref 0.0–0.7)
Eosinophils Relative: 0.6 % (ref 0.0–5.0)
HCT: 38.9 % (ref 36.0–46.0)
Hemoglobin: 12.6 g/dL (ref 12.0–15.0)
Lymphocytes Relative: 37 % (ref 12.0–46.0)
Lymphs Abs: 3.9 K/uL (ref 0.7–4.0)
MCHC: 32.4 g/dL (ref 30.0–36.0)
MCV: 88.5 fl (ref 78.0–100.0)
Monocytes Absolute: 0.6 K/uL (ref 0.1–1.0)
Monocytes Relative: 5.8 % (ref 3.0–12.0)
Neutro Abs: 6 K/uL (ref 1.4–7.7)
Neutrophils Relative %: 56.4 % (ref 43.0–77.0)
Platelets: 360 K/uL (ref 150.0–400.0)
RBC: 4.39 Mil/uL (ref 3.87–5.11)
RDW: 14.9 % (ref 11.5–15.5)
WBC: 10.6 K/uL — ABNORMAL HIGH (ref 4.0–10.5)

## 2024-02-24 LAB — COMPREHENSIVE METABOLIC PANEL WITH GFR
ALT: 9 U/L (ref 0–35)
AST: 12 U/L (ref 0–37)
Albumin: 4.1 g/dL (ref 3.5–5.2)
Alkaline Phosphatase: 73 U/L (ref 39–117)
BUN: 19 mg/dL (ref 6–23)
CO2: 29 meq/L (ref 19–32)
Calcium: 9.2 mg/dL (ref 8.4–10.5)
Chloride: 106 meq/L (ref 96–112)
Creatinine, Ser: 0.68 mg/dL (ref 0.40–1.20)
GFR: 76.15 mL/min (ref >60.00)
Glucose, Bld: 111 mg/dL — ABNORMAL HIGH (ref 70–99)
Potassium: 3.6 meq/L (ref 3.5–5.1)
Sodium: 140 meq/L (ref 135–145)
Total Bilirubin: 1 mg/dL (ref 0.2–1.2)
Total Protein: 6.9 g/dL (ref 6.0–8.3)

## 2024-02-24 LAB — LIPID PANEL
Cholesterol: 166 mg/dL (ref 0–200)
HDL: 71.3 mg/dL (ref >39.00)
LDL Cholesterol: 75 mg/dL (ref 0–99)
NonHDL: 94.91
Total CHOL/HDL Ratio: 2
Triglycerides: 101 mg/dL (ref 0.0–149.0)
VLDL: 20.2 mg/dL (ref 0.0–40.0)

## 2024-02-24 MED ORDER — HYDROCORTISONE ACETATE 25 MG RE SUPP: 25.0000 mg | Freq: Two times a day (BID) | RECTAL | 0 refills | Status: DC

## 2024-02-24 MED ORDER — AMOXICILLIN-POT CLAVULANATE 875-125 MG PO TABS
1.0000 | ORAL_TABLET | Freq: Two times a day (BID) | ORAL | 0 refills | Status: DC
Start: 2024-02-24 — End: 2024-04-18

## 2024-02-24 MED ORDER — MUPIROCIN 2 % EX OINT: 1.0000 | TOPICAL_OINTMENT | Freq: Two times a day (BID) | CUTANEOUS | 0 refills | Status: DC

## 2024-02-24 NOTE — Patient Instructions (Signed)
 Dizziness Intermittent lightheadedness without severe symptoms. Differential includes paroxysmal atrial fibrillation. Awaiting heart monitor data. - Monitor heart rate using O2 saturation, sugarlevel,  bp and pulse during dizziness episodes. - Check blood pressure and blood glucose during dizziness episodes. - Order lab tests for blood volume, anemia, and electrolytes. - Review heart monitor data for correlation with atrial fibrillation. - Consider emergent imaging if dizziness is associated with headache, vision changes, or slurred speech.  Paroxysmal Atrial Fibrillation Paroxysmal atrial fibrillation with previous episodes. On Eliquis  for stroke prevention. Heart rate controlled. Awaiting heart monitor data. - Continue Eliquis  for stroke prevention. - Review heart monitor data for recent atrial fibrillation episodes. - Monitor pulse using O2 saturation monitor for rate control.  History of Cerebellar Infarct Remote cerebellar infarct with potential impact on balance. Monitor for neurological symptoms. - Monitor for neurological symptoms such as headache, vision changes, or slurred speech.  Dog Bite Infection Recent dog bite with early infection signs. Risk of rapid infection. Oral antibiotics standard of care. - Prescribe Augmentin  875 mg twice daily for 10 days. - Consider stopping Augmentin  at 7 days if infection resolves and no side effects. - Prescribe topical Mupirocin  for local application.  Hypertension Blood pressure reduced to 147/75 mmHg. On amlodipine  5 mg twice daily. - Continue amlodipine  5 mg twice daily. - Monitor blood pressure and report if consistently over 150 mmHg.  Hemorrhoid Mild early hemorrhoid with slight inflammation. No thrombosis. - Prescribe Anusol  suppositories for symptomatic relief. - Advise to avoid constipation.  General Health Maintenance Opportunity to check cholesterol levels in fasting state. - Order lipid panel.  Follow-up Follow-up to  be determined after lab review and heart rate monitoring. - Determine follow-up date after lab review and heart rate monitoring update.

## 2024-02-24 NOTE — Progress Notes (Signed)
 Subjective:    Patient ID: Leslie Ward, female    DOB: 08-27-1930, 89 y.o.   MRN: [id number]  HPI  Leslie Ward is a 89 year old female with paroxysmal atrial fibrillation who presents with episodes of dizziness.  She experienced episodes of dizziness on Saturday and Sunday, each lasting less than a minute. The sensation was described as lightheadedness rather than vertigo, occurring while making breakfast and taking a breath. She lay down until the episodes passed. No associated vomiting, vision changes, weakness in hands or feet, palpitations, or chest pain. She has a history of paroxysmal atrial fibrillation, detected on a heart monitor following a cerebellar infarct two years ago. Currently on Eliquis  for anticoagulation and amlodipine  5 mg twice daily for blood pressure management. Her son mentions that the heart monitor data is scheduled to be reviewed on July 21st to check for any correlation between atrial fibrillation and the recent dizziness episodes.  She experienced a dog bite on her hand last night, which is showing signs of infection. The dog is vaccinated.  She reports a mild itchy sensation in the rectal area after eating [place] food, with no constipation, diarrhea, or palpable lumps.  Review of Systems  Constitutional:  Negative for chills, fatigue and fever.  Respiratory:  Negative for chest tightness, shortness of breath and wheezing.   Cardiovascular:  Negative for chest pain and palpitations.  Gastrointestinal:  Negative for abdominal pain, diarrhea and vomiting.       Rectal pain briefly other night.  Musculoskeletal:  Negative for back pain, myalgias and neck pain.  Skin:  Negative for pallor, rash and wound.       Left hand pain dorsal aspect from linear scratch/very supefcial bite from her small dog.  Neurological:  Negative for dizziness, speech difficulty, weakness and headaches.       None presently.  Psychiatric/Behavioral:  Negative for behavioral  problems, dysphoric mood and sleep disturbance.     Past Medical History:  Diagnosis Date   Acute encephalopathy 06/11/2023   Adhesive capsulitis of left shoulder 12/22/2018   Bilateral shoulder pain 08/24/2021   Blepharitis of upper and lower eyelids of both eyes 01/07/2020   BPPV (benign paroxysmal positional vertigo), right 01/03/2021   Cerebrovascular accident (CVA) (HCC) 10/25/2021   Cervical dystonia 03/08/2019   Xeomin  approved diagnosis.   COVID 05/29/2021   Dermatochalasis of both upper eyelids 01/07/2020   Dystonia    Edema 02/11/2019   Gait abnormality 10/13/2023   Ganglion cyst 08/25/2020   Glaucoma    History of stroke 10/2021   Hypercoagulable state due to paroxysmal atrial fibrillation (HCC) 07/18/2023   Hyperlipidemia 02/11/2019   Hypertension    Hypertensive retinopathy of both eyes 01/07/2020   Hypothyroidism (acquired) 01/19/2019   Inflammatory arthritis 08/24/2021   Macular RPE mottling 01/07/2020   Meibomian gland dysfunction (MGD) of both eyes 01/07/2020   Mixed incontinence 03/15/2020   Formatting of this note might be different from the original.  Added automatically from request for surgery [id number]   Neck pain 01/19/2019   Osteoarthritis of right knee 12/18/2022   Paroxysmal atrial fibrillation (HCC) 07/18/2023   Post-COVID chronic fatigue 05/29/2021   Post-COVID chronic joint pain 05/29/2021   Post-COVID chronic muscle pain 05/29/2021   Primary open angle glaucoma (POAG) of both eyes, moderate stage 09/12/2021   Sedimentation rate elevation 08/24/2021   Stroke (HCC) 10/2021   Stroke (HCC) 05/2023   Syncope 10/09/2021   Thyroid  disease    Type 2 diabetes mellitus (HCC)  01/19/2019   Urgency incontinence 03/15/2020   Formatting of this note might be different from the original.  Added automatically from request for surgery [id number]   Urinary frequency 08/24/2021   Vertigo 12/01/2019   Vitreous syneresis of both eyes 01/07/2020     Social  History   Socioeconomic History   Marital status: Widowed    Spouse name: Not on file   Number of children: 2   Years of education: 6 or 7 years of education   Highest education level: Not on file  Occupational History   Occupation: Retired  Tobacco Use   Smoking status: Never    Passive exposure: Past   Smokeless tobacco: Never   Tobacco comments:    Never smoker 07/18/23  Vaping Use   Vaping status: Never Used  Substance and Sexual Activity   Alcohol use: No   Drug use: No   Sexual activity: Not on file  Other Topics Concern   Not on file  Social History Narrative   Lives alone.   Right-handed.   No caffeine  use.   Social Drivers of Corporate investment banker Strain: Low Risk  (10/01/2021)   Overall Financial Resource Strain (CARDIA)    Difficulty of Paying Living Expenses: Not hard at all  Food Insecurity: No Food Insecurity (06/17/2023)   Hunger Vital Sign    Worried About Running Out of Food in the Last Year: Never true    Ran Out of Food in the Last Year: Never true  Transportation Needs: No Transportation Needs (06/17/2023)   PRAPARE - Administrator, Civil Service (Medical): No    Lack of Transportation (Non-Medical): No  Physical Activity: Inactive (10/01/2021)   Exercise Vital Sign    Days of Exercise per Week: 0 days    Minutes of Exercise per Session: 0 min  Stress: No Stress Concern Present (10/01/2021)   [employer] of Occupational Health - Occupational Stress Questionnaire    Feeling of Stress : Not at all  Social Connections: Moderately Isolated (12/18/2022)   Social Connection and Isolation Panel    Frequency of Communication with Friends and Family: More than three times a week    Frequency of Social Gatherings with Friends and Family: Twice a week    Attends Religious Services: More than 4 times per year    Active Member of [employer] or Organizations: No    Attends Banker Meetings: Never    Marital Status: Widowed   Intimate Partner Violence: Not At Risk (06/12/2023)   Humiliation, Afraid, Rape, and Kick questionnaire    Fear of Current or Ex-Partner: No    Emotionally Abused: No    Physically Abused: No    Sexually Abused: No    Past Surgical History:  Procedure Laterality Date   LOOP RECORDER INSERTION N/A 06/13/2023   Procedure: LOOP RECORDER INSERTION;  Surgeon: Inocencio Soyla Lunger, MD;  Location: [facility];  Service: Cardiovascular;  Laterality: N/A;    Family History  Problem Relation Age of Onset   Stroke Mother    Heart attack Father    Heart Problems Brother    High blood pressure Maternal Grandmother    Liver disease Neg Hx    Colon cancer Neg Hx    Esophageal cancer Neg Hx     Allergies  Allergen Reactions   Crestor [Rosuvastatin Calcium ] Other (See Comments)    caused me to go into kidney failure    Current Outpatient Medications on File Prior  to Visit  Medication Sig Dispense Refill   amLODipine  (NORVASC ) 5 MG tablet 1 tab po bid 60 tablet 11   apixaban  (ELIQUIS ) 2.5 MG TABS tablet TAKE 1 TABLET BY MOUTH 2 TIMES A DAY 60 tablet 11   atorvastatin  (LIPITOR) 20 MG tablet Take 1 tablet (20 mg total) by mouth daily. 90 tablet 3   Calcium  Carbonate (CALCIUM  600 PO) Take 1 tablet by mouth daily.     Cholecalciferol (VITAMIN D) 125 MCG (5000 UT) CAPS Take 5,000 Units by mouth daily.     Cinnamon 500 MG capsule Take 1,000 mg by mouth daily.     dorzolamide -timolol  (COSOPT ) 2-0.5 % ophthalmic solution Place 1 drop into both eyes 2 (two) times daily.     fluconazole  (DIFLUCAN ) 150 MG tablet Take 1 tablet (150 mg total) by mouth daily. 1 tablet 0   latanoprost (XALATAN) 0.005 % ophthalmic solution Place 1 drop into both eyes every evening.     levothyroxine  (SYNTHROID ) 50 MCG tablet TAKE 1 TABLET BY MOUTH DAILY BEFORE BREAKFAST 30 tablet 0   meclizine  (ANTIVERT ) 25 MG tablet Take 0.5-1 tablets (12.5-25 mg total) by mouth 3 (three) times daily as needed for dizziness. 30  tablet 0   Menaquinone-7 (VITAMIN K2 PO) Take 1 tablet by mouth at bedtime.     metoprolol  tartrate (LOPRESSOR ) 50 MG tablet TAKE 1 TABLET BY MOUTH 2 TIMES A DAY 180 tablet 0   Phytosterol Esters (CHOLEST CARE PO) Take 1,600 mg by mouth daily.     Probiotic Product (PROBIOTIC DAILY PO) Take 1 tablet by mouth at bedtime.     vitamin C (ASCORBIC ACID) 500 MG tablet Take 500 mg by mouth daily.     Current Facility-Administered Medications on File Prior to Visit  Medication Dose Route Frequency Provider Last Rate Last Admin   incobotulinumtoxinA  (XEOMIN ) 100 units injection 100 Units  100 Units Intramuscular Q90 days Onita Duos, MD       incobotulinumtoxinA  (XEOMIN ) 100 units injection 100 Units  100 Units Intramuscular Q90 days Onita Duos, MD   100 Units at 05/03/22 2307    BP (!) 147/75   Pulse 80   Temp 98 F (36.7 C)   Resp 18   Ht 4' 9 (1.448 m)   Wt 117 lb (53.1 kg)   SpO2 95%   BMI 25.32 kg/m        Objective:   Physical Exam  General Mental Status- Alert. General Appearance- Not in acute distress.   Skin General: Color- Normal Color. Moisture- Normal Moisture.  Neck Carotid Arteries- Normal color. Moisture- Normal Moisture. No carotid bruits. No JVD.  Chest and Lung Exam Auscultation: Breath Sounds:-Normal.  Cardiovascular Auscultation:Rythm- Regular. Murmurs & Other Heart Sounds:Auscultation of the heart reveals- No Murmurs.  Abdomen Inspection:-Inspeection Normal. Palpation/Percussion:Note:No mass. Palpation and Percussion of the abdomen reveal- Non Tender, Non Distended + BS, no rebound or guarding.   Neurologic Cranial Nerve exam:- CN III-XII intact(No nystagmus), symmetric smile. Drift Test:- No drift. Finger to Nose:- Normal/Intact Strength:- 5/5 equal and symmetric strength both upper and lower extremities.   Left hand- small linear scratches/ shallow possible bite from dog. Mid pink redness to dorsal hand. Can flex and extender finger.  Rectal-  at 3 oclock position. Small non thrombosed hemorrhoid seen. No blood. Non tender presently.    Assessment & Plan:   Patient Instructions  Dizziness Intermittent lightheadedness without severe symptoms. Differential includes paroxysmal atrial fibrillation. Awaiting heart monitor data. - Monitor heart rate using O2 saturation,  sugarlevel,  bp and pulse during dizziness episodes. - Check blood pressure and blood glucose during dizziness episodes. - Order lab tests for blood volume, anemia, and electrolytes. - Review heart monitor data for correlation with atrial fibrillation. - Consider emergent imaging if dizziness is associated with headache, vision changes, or slurred speech.  Paroxysmal Atrial Fibrillation Paroxysmal atrial fibrillation with previous episodes. On Eliquis  for stroke prevention. Heart rate controlled. Awaiting heart monitor data. - Continue Eliquis  for stroke prevention. - Review heart monitor data for recent atrial fibrillation episodes. - Monitor pulse using O2 saturation monitor for rate control.  History of Cerebellar Infarct Remote cerebellar infarct with potential impact on balance. Monitor for neurological symptoms. - Monitor for neurological symptoms such as headache, vision changes, or slurred speech.  Dog Bite Infection Recent dog bite with early infection signs. Risk of rapid infection. Oral antibiotics standard of care. - Prescribe Augmentin  875 mg twice daily for 10 days. - Consider stopping Augmentin  at 7 days if infection resolves and no side effects. - Prescribe topical Mupirocin  for local application.  Hypertension Blood pressure reduced to 147/75 mmHg. On amlodipine  5 mg twice daily. - Continue amlodipine  5 mg twice daily. - Monitor blood pressure and report if consistently over 150 mmHg.  Hemorrhoid Mild early hemorrhoid with slight inflammation. No thrombosis. - Prescribe Anusol  suppositories for symptomatic relief. - Advise to avoid  constipation.  General Health Maintenance Opportunity to check cholesterol levels in fasting state. - Order lipid panel.  Follow-up Follow-up to be determined after lab review and heart rate monitoring. - Determine follow-up date after lab review and heart rate monitoring update.   Debbi Strandberg, PA-C   Time spent with patient today was  45 minutes which consisted of chart revdiew, discussing diagnosis, work up treatment and documentation.

## 2024-02-25 ENCOUNTER — Other Ambulatory Visit: Payer: Self-pay | Admitting: Medical

## 2024-02-26 ENCOUNTER — Ambulatory Visit: Payer: Self-pay | Admitting: Medical

## 2024-03-01 ENCOUNTER — Ambulatory Visit (INDEPENDENT_AMBULATORY_CARE_PROVIDER_SITE_OTHER): Payer: Self-pay

## 2024-03-01 DIAGNOSIS — I639 Cerebral infarction, unspecified: Secondary | ICD-10-CM | POA: Diagnosis not present

## 2024-03-02 LAB — CUP PACEART REMOTE DEVICE CHECK
Date Time Interrogation Session: 20250721095828
Implantable Pulse Generator Implant Date: 20241101

## 2024-03-10 ENCOUNTER — Ambulatory Visit: Payer: Self-pay | Admitting: Cardiology

## 2024-03-21 ENCOUNTER — Emergency Department (HOSPITAL_BASED_OUTPATIENT_CLINIC_OR_DEPARTMENT_OTHER)

## 2024-03-21 ENCOUNTER — Encounter (HOSPITAL_BASED_OUTPATIENT_CLINIC_OR_DEPARTMENT_OTHER): Payer: Self-pay

## 2024-03-21 ENCOUNTER — Emergency Department (HOSPITAL_BASED_OUTPATIENT_CLINIC_OR_DEPARTMENT_OTHER)
Admission: EM | Admit: 2024-03-21 | Discharge: 2024-03-21 | Disposition: A | Discharge status: Home / Self Care | Attending: Emergency Medicine | Admitting: Emergency Medicine

## 2024-03-21 DIAGNOSIS — Z7901 Long term (current) use of anticoagulants: Secondary | ICD-10-CM | POA: Diagnosis not present

## 2024-03-21 DIAGNOSIS — I4891 Unspecified atrial fibrillation: Secondary | ICD-10-CM | POA: Diagnosis not present

## 2024-03-21 DIAGNOSIS — M47812 Spondylosis without myelopathy or radiculopathy, cervical region: Secondary | ICD-10-CM | POA: Diagnosis not present

## 2024-03-21 DIAGNOSIS — R0789 Other chest pain: Secondary | ICD-10-CM | POA: Diagnosis not present

## 2024-03-21 DIAGNOSIS — Z79899 Other long term (current) drug therapy: Secondary | ICD-10-CM | POA: Insufficient documentation

## 2024-03-21 DIAGNOSIS — M545 Low back pain, unspecified: Secondary | ICD-10-CM | POA: Insufficient documentation

## 2024-03-21 DIAGNOSIS — M51369 Other intervertebral disc degeneration, lumbar region without mention of lumbar back pain or lower extremity pain: Secondary | ICD-10-CM | POA: Diagnosis not present

## 2024-03-21 DIAGNOSIS — R102 Pelvic and perineal pain: Secondary | ICD-10-CM | POA: Diagnosis not present

## 2024-03-21 DIAGNOSIS — I6789 Other cerebrovascular disease: Secondary | ICD-10-CM | POA: Diagnosis not present

## 2024-03-21 DIAGNOSIS — M48061 Spinal stenosis, lumbar region without neurogenic claudication: Secondary | ICD-10-CM | POA: Insufficient documentation

## 2024-03-21 DIAGNOSIS — G9389 Other specified disorders of brain: Secondary | ICD-10-CM | POA: Diagnosis not present

## 2024-03-21 DIAGNOSIS — S3991XA Unspecified injury of abdomen, initial encounter: Secondary | ICD-10-CM | POA: Diagnosis not present

## 2024-03-21 DIAGNOSIS — E119 Type 2 diabetes mellitus without complications: Secondary | ICD-10-CM | POA: Insufficient documentation

## 2024-03-21 DIAGNOSIS — W19XXXA Unspecified fall, initial encounter: Secondary | ICD-10-CM | POA: Insufficient documentation

## 2024-03-21 DIAGNOSIS — J90 Pleural effusion, not elsewhere classified: Secondary | ICD-10-CM | POA: Diagnosis not present

## 2024-03-21 DIAGNOSIS — S0990XA Unspecified injury of head, initial encounter: Secondary | ICD-10-CM | POA: Diagnosis not present

## 2024-03-21 DIAGNOSIS — M5134 Other intervertebral disc degeneration, thoracic region: Secondary | ICD-10-CM | POA: Diagnosis not present

## 2024-03-21 DIAGNOSIS — M549 Dorsalgia, unspecified: Secondary | ICD-10-CM

## 2024-03-21 DIAGNOSIS — M5020 Other cervical disc displacement, unspecified cervical region: Secondary | ICD-10-CM | POA: Diagnosis not present

## 2024-03-21 DIAGNOSIS — S299XXA Unspecified injury of thorax, initial encounter: Secondary | ICD-10-CM | POA: Diagnosis not present

## 2024-03-21 DIAGNOSIS — Z8673 Personal history of transient ischemic attack (TIA), and cerebral infarction without residual deficits: Secondary | ICD-10-CM | POA: Diagnosis not present

## 2024-03-21 DIAGNOSIS — I639 Cerebral infarction, unspecified: Secondary | ICD-10-CM | POA: Diagnosis not present

## 2024-03-21 DIAGNOSIS — M4802 Spinal stenosis, cervical region: Secondary | ICD-10-CM | POA: Diagnosis not present

## 2024-03-21 DIAGNOSIS — S3993XA Unspecified injury of pelvis, initial encounter: Secondary | ICD-10-CM | POA: Diagnosis not present

## 2024-03-21 DIAGNOSIS — S199XXA Unspecified injury of neck, initial encounter: Secondary | ICD-10-CM | POA: Diagnosis not present

## 2024-03-21 DIAGNOSIS — S3992XA Unspecified injury of lower back, initial encounter: Secondary | ICD-10-CM | POA: Diagnosis not present

## 2024-03-21 DIAGNOSIS — M40204 Unspecified kyphosis, thoracic region: Secondary | ICD-10-CM | POA: Diagnosis not present

## 2024-03-21 LAB — PROTIME-INR
INR: 1.1 (ref 0.8–1.2)
Prothrombin Time: 14.7 s (ref 11.4–15.2)

## 2024-03-21 LAB — CBC WITH DIFFERENTIAL/PLATELET
Abs Immature Granulocytes: 0.05 K/uL (ref 0.00–0.07)
Basophils Absolute: 0 K/uL (ref 0.0–0.1)
Basophils Relative: 0 %
Eosinophils Absolute: 0.1 K/uL (ref 0.0–0.5)
Eosinophils Relative: 1 %
HCT: 37.2 % (ref 36.0–46.0)
Hemoglobin: 12.1 g/dL (ref 12.0–15.0)
Immature Granulocytes: 1 %
Lymphocytes Relative: 33 %
Lymphs Abs: 3.1 K/uL (ref 0.7–4.0)
MCH: 28.3 pg (ref 26.0–34.0)
MCHC: 32.5 g/dL (ref 30.0–36.0)
MCV: 87.1 fL (ref 80.0–100.0)
Monocytes Absolute: 0.7 K/uL (ref 0.1–1.0)
Monocytes Relative: 7 %
Neutro Abs: 5.5 K/uL (ref 1.7–7.7)
Neutrophils Relative %: 58 %
Platelets: 291 K/uL (ref 150–400)
RBC: 4.27 MIL/uL (ref 3.87–5.11)
RDW: 14.9 % (ref 11.5–15.5)
WBC: 9.4 K/uL (ref 4.0–10.5)
nRBC: 0 % (ref 0.0–0.2)

## 2024-03-21 LAB — COMPREHENSIVE METABOLIC PANEL WITH GFR
ALT: 12 U/L (ref 0–44)
AST: 19 U/L (ref 15–41)
Albumin: 3.7 g/dL (ref 3.5–5.0)
Alkaline Phosphatase: 78 U/L (ref 38–126)
Anion gap: 12 (ref 5–15)
BUN: 15 mg/dL (ref 8–23)
CO2: 24 mmol/L (ref 22–32)
Calcium: 9.3 mg/dL (ref 8.9–10.3)
Chloride: 104 mmol/L (ref 98–111)
Creatinine, Ser: 0.64 mg/dL (ref 0.44–1.00)
GFR, Estimated: >60 mL/min (ref >60)
Glucose, Bld: 103 mg/dL — ABNORMAL HIGH (ref 70–99)
Potassium: 4.2 mmol/L (ref 3.5–5.1)
Sodium: 139 mmol/L (ref 135–145)
Total Bilirubin: 0.9 mg/dL (ref 0.0–1.2)
Total Protein: 6.9 g/dL (ref 6.5–8.1)

## 2024-03-21 LAB — URINALYSIS, ROUTINE W REFLEX MICROSCOPIC
Bilirubin Urine: NEGATIVE
Glucose, UA: NEGATIVE mg/dL
Ketones, ur: NEGATIVE mg/dL
Leukocytes,Ua: NEGATIVE
Nitrite: NEGATIVE
Protein, ur: NEGATIVE mg/dL
Specific Gravity, Urine: <=1.005 (ref 1.005–1.030)
pH: 6 (ref 5.0–8.0)

## 2024-03-21 LAB — URINALYSIS, MICROSCOPIC (REFLEX)

## 2024-03-21 LAB — TROPONIN T, HIGH SENSITIVITY: Troponin T High Sensitivity: <15 ng/L (ref <19)

## 2024-03-21 MED ORDER — FENTANYL CITRATE PF 50 MCG/ML IJ SOSY
25.0000 ug | PREFILLED_SYRINGE | Freq: Once | INTRAMUSCULAR | Status: AC
  Administered 2024-03-21: 25 ug via INTRAVENOUS
  Filled 2024-03-21: qty 1

## 2024-03-21 MED ORDER — IOHEXOL 300 MG/ML  SOLN
100.0000 mL | Freq: Once | INTRAMUSCULAR | Status: AC | PRN
  Administered 2024-03-21: 100 mL via INTRAVENOUS

## 2024-03-21 NOTE — Discharge Instructions (Signed)
 Workup today is largely reassuring.  Please follow-up with your primary care provider to review today's results.  Return to the emergency room for any worsening or concerning symptoms.

## 2024-03-21 NOTE — ED Notes (Signed)
 Discharge paperwork reviewed entirely with patient, including follow up care. Pain was under control. No prescriptions were called in, but all questions were addressed.  Pt verbalized understanding as well as all parties involved. No questions or concerns voiced at the time of discharge. No acute distress noted. Pt was encouraged to stay adequately hydrated and eat a healthy diet.   Pt was wheeled out to the PVA in a wheelchair without incident.  Pt advised they will notify their PCP immediately.  The pt was instructed to set up and/or review MyChart for their results; and was informed their Providers all have access to the information as well.

## 2024-03-21 NOTE — ED Provider Notes (Signed)
 [place] [facility] AT [facility] Provider Note   CSN: [id number] Arrival date & time: 03/21/24  9044     Patient presents with: [place]   Leslie Ward is a 89 y.o. female.   89 year old female brought in by son for evaluation after fall x 2, on Eliquis . Patient fell on Friday (03/19/24) when she was sitting down, missed the chair and landed on her bottom on the ground. Son witnessed the fall, she was evaluated at home and thought to not need any further evaluation at that time, has been functioning at baseline since that fall.  Last night, patient was transferring from bed to bedside commode when she slipped/didn't have a grab bar on that side of the bed and fell, landing on the ground. Fall was unwitnessed, patient was able to get herself back up and in bed and told her son about the incident this morning (son lives near by, patient lives alone).  Patient now complains of pain in back, left side ribs/pelvis, it is unclear if this is chronic or acute pain and unclear if the pain in her left ribs is chest pain vs msk pain.        Prior to Admission medications   Medication Sig Start Date End Date Taking? Authorizing Provider  amLODipine  (NORVASC ) 5 MG tablet 1 tab po bid 07/09/23  Yes Saguier, Dallas, PA-C  apixaban  (ELIQUIS ) 2.5 MG TABS tablet TAKE 1 TABLET BY MOUTH 2 TIMES A DAY 09/17/23  Yes Suarez, Joseph J, PA-C  Calcium  Carbonate (CALCIUM  600 PO) Take 1 tablet by mouth daily.   Yes [provider]  Cholecalciferol (VITAMIN D) 125 MCG (5000 UT) CAPS Take 5,000 Units by mouth daily.   Yes [provider]  amoxicillin -clavulanate (AUGMENTIN ) 875-125 MG tablet Take 1 tablet by mouth 2 (two) times daily. 02/24/24   Saguier, Dallas, PA-C  atorvastatin  (LIPITOR) 20 MG tablet Take 1 tablet (20 mg total) by mouth daily. 07/04/23   Saguier, Dallas, PA-C  Cinnamon 500 MG capsule Take 1,000 mg by mouth daily.    [provider]   dorzolamide -timolol  (COSOPT ) 2-0.5 % ophthalmic solution Place 1 drop into both eyes 2 (two) times daily. 10/30/22   [provider]  fluconazole  (DIFLUCAN ) 150 MG tablet Take 1 tablet (150 mg total) by mouth daily. 12/12/23   Saguier, Dallas, PA-C  hydrocortisone  (ANUSOL -HC) 25 MG suppository Place 1 suppository (25 mg total) rectally 2 (two) times daily. 02/24/24   Saguier, Dallas, PA-C  latanoprost (XALATAN) 0.005 % ophthalmic solution Place 1 drop into both eyes every evening. 09/12/21   [provider]  levothyroxine  (SYNTHROID ) 50 MCG tablet TAKE 1 TABLET BY MOUTH DAILY BEFORE BREAKFAST 02/25/24   Saguier, Dallas, PA-C  meclizine  (ANTIVERT ) 25 MG tablet Take 0.5-1 tablets (12.5-25 mg total) by mouth 3 (three) times daily as needed for dizziness. 10/10/23   Harris, Abigail, PA-C  Menaquinone-7 (VITAMIN K2 PO) Take 1 tablet by mouth at bedtime.    [provider]  metoprolol  tartrate (LOPRESSOR ) 50 MG tablet TAKE 1 TABLET BY MOUTH 2 TIMES A DAY 02/25/24   Saguier, Dallas, PA-C  mupirocin  ointment (BACTROBAN ) 2 % Apply 1 Application topically 2 (two) times daily. 02/24/24   Saguier, Dallas, PA-C  Phytosterol Esters (CHOLEST CARE PO) Take 1,600 mg by mouth daily.    [provider]  Probiotic Product (PROBIOTIC DAILY PO) Take 1 tablet by mouth at bedtime.    [provider]  vitamin C (ASCORBIC ACID) 500  MG tablet Take 500 mg by mouth daily.    [provider]    Allergies: Crestor [rosuvastatin calcium ]    Review of Systems Negative except as per HPI Updated Vital Signs BP (!) 145/61 (BP Location: Left Arm)   Pulse 76   Temp (!) 97.5 F (36.4 C) (Oral) Comment: Simultaneous filing. User may not have seen previous data. Comment (Src): Simultaneous filing. User may not have seen previous data.  Resp 16   Ht 4' 9 (1.448 m)   Wt 54.3 kg   SpO2 97%   BMI 25.92 kg/m   Physical Exam Vitals and nursing note reviewed.  Constitutional:       General: She is not in acute distress.    Appearance: She is well-developed. She is not diaphoretic.  HENT:     Head: Normocephalic and atraumatic.     Mouth/Throat:     Mouth: Mucous membranes are dry.  Eyes:     Pupils: Pupils are equal, round, and reactive to light.  Neck:   Cardiovascular:     Rate and Rhythm: Normal rate and regular rhythm.     Heart sounds: Normal heart sounds.  Pulmonary:     Effort: Pulmonary effort is normal.     Breath sounds: Normal breath sounds.  Chest:     Chest wall: No tenderness.  Abdominal:     Palpations: Abdomen is soft.     Tenderness: There is no abdominal tenderness.  Musculoskeletal:        General: No swelling, tenderness or deformity.     Cervical back: Neck supple. No tenderness or bony tenderness.     Thoracic back: No tenderness or bony tenderness.     Lumbar back: No tenderness or bony tenderness.     Right lower leg: No edema.     Left lower leg: No edema.     Comments: Upper and lower extremities ROM without pain, no crepitus or deformity   Skin:    General: Skin is warm and dry.  Neurological:     Mental Status: She is alert and oriented to person, place, and time.  Psychiatric:        Behavior: Behavior normal.     (all labs ordered are listed, but only abnormal results are displayed) Labs Reviewed  COMPREHENSIVE METABOLIC PANEL WITH GFR - Abnormal; Notable for the following components:      Result Value   Glucose, Bld 103 (*)    All other components within normal limits  URINALYSIS, ROUTINE W REFLEX MICROSCOPIC - Abnormal; Notable for the following components:   Hgb urine dipstick TRACE (*)    All other components within normal limits  URINALYSIS, MICROSCOPIC (REFLEX) - Abnormal; Notable for the following components:   Bacteria, UA FEW (*)    All other components within normal limits  CBC WITH DIFFERENTIAL/PLATELET  PROTIME-INR  TROPONIN T, HIGH SENSITIVITY    EKG: EKG Interpretation Date/Time:  Sunday March 21 2024 10:52:52 EDT Ventricular Rate:  74 PR Interval:  193 QRS Duration:  127 QT Interval:  415 QTC Calculation: 461 R Axis:   31  Text Interpretation: Age not entered, assumed to be  89 years old for purpose of ECG interpretation Sinus rhythm Nonspecific intraventricular conduction delay ST depr, consider ischemia, inferior leads similar to prior EKG Confirmed by Lenor Hollering [phone]) on 03/21/2024 11:10:36 AM  Radiology: CT L-SPINE NO CHARGE Result Date: 03/21/2024 CLINICAL DATA:  Ataxia, lumbar trauma EXAM: CT LUMBAR SPINE WITHOUT CONTRAST TECHNIQUE: Multidetector CT  imaging of the lumbar spine was performed without intravenous contrast administration. Multiplanar CT image reconstructions were also generated. RADIATION DOSE REDUCTION: This exam was performed according to the departmental dose-optimization program which includes automated exposure control, adjustment of the mA and/or kV according to patient size and/or use of iterative reconstruction technique. COMPARISON:  February 27, 2020, November 29, 2023 FINDINGS: Segmentation: 5 lumbar type vertebral bodies. Alignment: Appropriate lumbar lordosis without spondylolisthesis, uncovering of the facet joints, or significant widening of the spinous processes. Vertebrae: Osteopenia. Likely bone island in L4. Vertebral body heights are preserved. No acute fracture. No primary bone lesion or focal pathologic process. Paraspinal and other soft tissues: No prevertebral edema or soft tissue thickening. No visible canal hematoma. Disc levels: Mild intervertebral disc height loss throughout the lumbar spine with moderate narrowing at L1-L2. Multilevel osteophyte formation with mild facet arthropathy at L4-L5 and L5-S1. Redemonstrated sacral nerve stimulator device. Moderate spinal canal narrowing at L4-L5. Moderate neural foraminal stenosis on the right at L4-L5. IMPRESSION: 1. No acute fracture or malalignment of the lumbar spine. 2. Mild multilevel degenerative  disc disease of the lumbar spine. There is moderate spinal canal and neural foraminal stenosis on the right at L4-L5. Correlation for myelopathic symptoms recommended. Electronically Signed   By: Rogelia Myers M.D.   On: 03/21/2024 13:50   CT T-SPINE NO CHARGE Result Date: 03/21/2024 CLINICAL DATA:  Ataxia, thoracic trauma EXAM: CT THORACIC SPINE WITHOUT CONTRAST TECHNIQUE: Multidetector CT images of the thoracic were obtained using the standard protocol without intravenous contrast. RADIATION DOSE REDUCTION: This exam was performed according to the departmental dose-optimization program which includes automated exposure control, adjustment of the mA and/or kV according to patient size and/or use of iterative reconstruction technique. COMPARISON:  None Available. FINDINGS: Alignment: 12 rib-bearing thoracic vertebral bodies. Appropriate thoracic kyphosis without spondylolisthesis, uncovering of the facet joints, or significant widening of the spinous processes. Vertebrae: Diffuse osteopenia. Vertebral body heights are preserved. No acute fracture. No primary bone lesion or focal pathologic process. Paraspinal and other soft tissues: No prevertebral edema or soft tissue thickening. No visible canal hematoma. Disc levels: Mild multilevel intervertebral disc height loss throughout the thoracic spine with multilevel osteophyte formation. No high-grade spinal canal or neuroforaminal stenosis. IMPRESSION: 1. No acute fracture or traumatic malalignment of the thoracic spine. 2. Mild multilevel degenerative disc disease throughout the thoracic spine. Electronically Signed   By: Rogelia Myers M.D.   On: 03/21/2024 13:34   CT CHEST ABDOMEN PELVIS W CONTRAST Result Date: 03/21/2024 CLINICAL DATA:  Polytrauma, blunt fall x 2 EXAM: CT CHEST, ABDOMEN, AND PELVIS WITH CONTRAST TECHNIQUE: Multidetector CT imaging of the chest, abdomen and pelvis was performed following the standard protocol during bolus administration of  intravenous contrast. RADIATION DOSE REDUCTION: This exam was performed according to the departmental dose-optimization program which includes automated exposure control, adjustment of the mA and/or kV according to patient size and/or use of iterative reconstruction technique. CONTRAST:  OMNIPAQUE  IOHEXOL  300 MG/ML  SOLN COMPARISON:  November 29, 2023 FINDINGS: CT CHEST FINDINGS Pulmonary Embolism: No pulmonary embolism. Cardiovascular: Mild cardiomegaly with trace pericardial effusion. Multi-vessel coronary atherosclerosis.No aortic aneurysm. Diffuse aortic atherosclerosis. Normal variant aberrant right subclavian artery. Mediastinum/Nodes: No mediastinal mass.No mediastinal, hilar, or axillary lymphadenopathy. Lungs/Pleura: The midline trachea and bronchi are patent. Posterior bibasilar dependent atelectasis. Biapical pleuroparenchymal scarring. No focal airspace consolidation or pneumothorax. Trace left pleural effusion. Subsegmental atelectasis in the lingula. CT ABDOMEN PELVIS FINDINGS Hepatobiliary: No mass.No radiopaque stones or wall thickening of the  gallbladder. No intrahepatic or extrahepatic biliary ductal dilation. The portal veins are patent. Pancreas: No mass or main ductal dilation. No peripancreatic inflammation or fluid collection. Spleen: Normal size. No mass. Adrenals/Urinary Tract: No adrenal masses. A couple of subcentimeter hypodensities are noted in the kidneys, too small to definitively characterize, but likely small cysts. No nephrolithiasis or hydronephrosis. The urinary bladder is distended without focal abnormality. Stomach/Bowel: The stomach is decompressed without focal abnormality. No small bowel wall thickening or inflammation. No small bowel obstruction.Normal appendix. Vascular/Lymphatic: No aortic aneurysm. Diffuse aortoiliac atherosclerosis. No intraabdominal or pelvic lymphadenopathy. Reproductive: Age-related atrophy of the uterus and ovaries. No concerning adnexal mass.No  free pelvic fluid. Other: No pneumoperitoneum, ascites, or mesenteric inflammation. Musculoskeletal: For findings regarding the thoracic and lumbar spine, please see the separately dictated CT reports for those regions. Otherwise, no acute fracture or destructive lesion. Multilevel degenerative disc disease of the spine. Sacral nerve stimulator device along the left gluteal region. IMPRESSION: 1. No acute, traumatic injury within the chest, abdomen, and pelvis. For findings regarding the thoracic and lumbar spine, please see the separately dictated CT report for those regions. 2. Unchanged trace left pleural effusion. Aortic Atherosclerosis (ICD10-I70.0). Electronically Signed   By: Rogelia Myers M.D.   On: 03/21/2024 13:28   CT Cervical Spine Wo Contrast Result Date: 03/21/2024 EXAM: CT CERVICAL SPINE WITHOUT CONTRAST 03/21/2024 12:50:01 PM TECHNIQUE: CT of the cervical spine was performed without the administration of intravenous contrast. Multiplanar reformatted images are provided for review. Automated exposure control, iterative reconstruction, and/or weight based adjustment of the mA/kV was utilized to reduce the radiation dose to as low as reasonably achievable. COMPARISON: None available. CLINICAL HISTORY: Neck trauma (Age >= 65y). Head trauma, minor (Age >= 65y); Neck trauma (Age >= 65y); Polytrauma, blunt, fall x 2; Ataxia, lumbar trauma; Ataxia, thoracic trauma FINDINGS: CERVICAL SPINE: BONES AND ALIGNMENT: Cervical lordosis is maintained. No listhesis. No facet subluxation or dislocation. No compression fracture or displaced fracture in the cervical spine. DEGENERATIVE CHANGES: There are disc protrusions at multiple levels with mild spinal canal stenosis at C3-4 and C4-5. Facet arthrosis and uncovertebral hypertrophy at multiple levels. SOFT TISSUES: No prevertebral soft tissue swelling. IMPRESSION: 1. No acute abnormality of the cervical spine. 2. Disc protrusions at multiple levels with mild  spinal canal stenosis at C3-4 and C4-5. Electronically signed by: Donnice Mania MD 03/21/2024 01:13 PM EDT RP Workstation: [id number]   CT Head Wo Contrast Result Date: 03/21/2024 EXAM: CT HEAD WITHOUT CONTRAST 03/21/2024 12:50:01 PM TECHNIQUE: CT of the head was performed without the administration of intravenous contrast. Automated exposure control, iterative reconstruction, and/or weight based adjustment of the mA/kV was utilized to reduce the radiation dose to as low as reasonably achievable. COMPARISON: MRI head 06/12/2023. CLINICAL HISTORY: Head trauma, minor (Age >= 65y). Head trauma, minor (Age >= 65y); Neck trauma (Age >= 65y); Polytrauma, blunt, fall x 2; Ataxia, lumbar trauma; Ataxia, thoracic trauma. FINDINGS: BRAIN AND VENTRICLES: No acute hemorrhage. Gray-white differentiation is preserved. No hydrocephalus. No extra-axial collection. No mass effect or midline shift. Nonspecific hypoattenuation in the periventricular and subcortical white matter, most likely representing chronic small vessel disease. Generalized mild parenchymal volume loss. Encephalomalacia in the left occipital lobe suggestive of remote cortical infarct. Additional remote infarcts in the posterior right cerebellum and right thalamus. ORBITS: Bilateral lens replacement. SINUSES: Mucosal thickening and secretions in the sphenoid sinuses. SOFT TISSUES AND SKULL: No acute soft tissue abnormality. No skull fracture. Small left mastoid effusion. IMPRESSION: 1. No acute intracranial  abnormality. 2. Chronic small vessel disease and generalized mild parenchymal volume loss. 3. 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 4. Additional remote infarcts in the posterior right cerebellum and right thalamus. Electronically signed by: Donnice Mania MD 03/21/2024 01:04 PM EDT RP Workstation: [id number]     Procedures   Medications Ordered in the ED  fentaNYL  (SUBLIMAZE ) injection 25 mcg (25 mcg Intravenous Given 03/21/24 1128)  iohexol   (OMNIPAQUE ) 300 MG/ML solution 100 mL (100 mLs Intravenous Contrast Given 03/21/24 1137)                                    Medical Decision Making Amount and/or Complexity of Data Reviewed Labs: ordered. Radiology: ordered.  Risk Prescription drug management.   This patient presents to the ED for concern of fall x 2, this involves an extensive number of treatment options, and is a complaint that carries with it a high risk of complications and morbidity.  The differential diagnosis includes but not limited to intracranial hemorrhage, fracture, cervical spine injury, compression fracture, rib fracture, pulmonary contusion, pneumonia, urinary tract affection, electrolyte or metabolic imbalance.   Co morbidities / Chronic conditions that complicate the patient evaluation  Hypertension, thyroid  disease, dystonia, stroke, diabetes, hyperlipidemia, A-fib, on Eliquis , hypertension.   Additional history obtained:  Additional history obtained from EMR External records from outside source obtained and reviewed including prior labs on file for comparison   Lab Tests:  I Ordered, and personally interpreted labs.  The pertinent results include: CBC within normals.  INR normal.  Urinalysis with trace hemoglobin, does not suggest infection.  Troponin is negative.  CMP without significant findings.   Imaging Studies ordered:  I ordered imaging studies including CT head, C-spine, L-spine, T-spine, chest/abdomen/pelvis I independently visualized and interpreted imaging which showed negative for acute process I agree with the radiologist interpretation   Cardiac Monitoring: / EKG:  The patient was maintained on a cardiac monitor.  I personally viewed and interpreted the cardiac monitored which showed an underlying rhythm of: Sinus rhythm, rate 74   Problem List / ED Course / Critical interventions / Medication management  89 year old female brought in by son for fall as above.  History  and physical are difficult as patient is unable to discern if her discomforts today are acute or chronic in nature.  She is complaining of pain in her back, nonspecific location as well as pain in the left side of her chest, unsure if this is rib or chest wall related versus cardiac pain.  Her workup today is largely reassuring.  She does have some canal stenosis in her lumbar spine.  Discussed with attending and patient's family regarding her fall x 2 recently.  Patient is ambulatory at baseline at this time.  Encouraged to follow-up with PCP to review today's results. I have reviewed the patients home medicines and have made adjustments as needed   Consultations Obtained:  I requested consultation with the ER attending, Dr. Lenor,  and discussed lab and imaging findings as well as pertinent plan - they recommend: Agrees with plan of care   Social Determinants of Health:  Lives alone with family nearby   Test / Admission - Considered:  Stable for discharge with plan for close follow-up with PCP to review today's workup.      Final diagnoses:  Fall, initial encounter  Chest wall pain  Back pain, unspecified back location, unspecified back pain laterality,  unspecified chronicity  Spinal stenosis of lumbar region without neurogenic claudication    ED Discharge Orders     None          Beverley Leita LABOR, PA-C 03/21/24 1516    Lenor Hollering, MD 03/26/24 [phone]

## 2024-03-21 NOTE — ED Triage Notes (Addendum)
 Pt reports to the ED after having a fall this morning at 1 am.  States that she fell transferring from bedside commode back to bed. Also had another fall on Friday after cleaning the floor. Pt takes Eliquis   Denies LOC. Denies hitting head. States that pain is located on left hip and bilateral knees.

## 2024-03-22 ENCOUNTER — Other Ambulatory Visit: Payer: Self-pay | Admitting: Medical

## 2024-03-23 ENCOUNTER — Telehealth: Payer: Self-pay

## 2024-03-23 NOTE — Telephone Encounter (Signed)
 Initial Comment Caller states states that the patient had a minor fall about an hour ago. Caller states that the patient is on blood thinners, states he knows she needs to be checked out if she has a fall, wants to discuss with a provider to get more guidance on what to do. Caller states that the patient's knee hit the floor in the fall, has been having knee pain. Translation No Nurse Assessment Nurse: [place], RN, Christina Date/Time (Eastern Time): 03/19/2024 8:59:28 PM Confirm and document reason for call. If symptomatic, describe symptoms. ---Caller stated that mother has a fall this evening and landed on her right side and butt. Caller stated that mother is on Eliquis  and was saying that she had knee pain but now is not having any pain. No other symptoms Does the patient have any new or worsening symptoms? ---Yes Will a triage be completed? ---Yes Related visit to physician within the last 2 weeks? ---No Does the PT have any chronic conditions? (i.e. diabetes, asthma, this includes High risk factors for pregnancy, etc.) ---Yes List chronic conditions. ---HX blood clots, stroke Is this a behavioral health or substance abuse call? ---No Guidelines Guideline Title Affirmed Question Affirmed Notes Nurse Date/Time (Eastern Time) Falls and Falling [1] Recent fall AND [2] no injury Salina, RN, Tawni 03/19/2024 9:01:55 PM PLEASE NOTE: All timestamps contained within this report are represented as [place] Standard Time. CONFIDENTIALTY NOTICE: This fax transmission is intended only for the addressee. It contains information that is legally privileged, confidential or otherwise protected from use or disclosure. If you are not the intended recipient, you are strictly prohibited from reviewing, disclosing, copying using or disseminating any of this information or taking any action in reliance on or regarding this information. If you have received this fax in error, please notify us   immediately by telephone so that we can arrange for its return to us . Phone: [phone], Toll-Free: [phone], Fax: [phone] [email or web address] November 26, 1930 Page: 1 of2 CallId: [id number] Disp. Time Titus Time) Disposition Final User 03/19/2024 8:44:52 PM Attempt made - message left Salina RN, Tawni 03/19/2024 9:10:52 PM Home Care Yes Salina RN, Tawni Final Disposition 03/19/2024 9:10:52 PM Home Care Yes Salina, RN, Tawni Caller Disagree/Comply Comply Caller Understands Yes PreDisposition Go to ED Care Advice Given Per Guideline HOME CARE: * You should be able to treat this at home. CALL BACK IF: * You become worse CARE ADVICE given per Falls and Falling (Adult) guideline. REASSURANCE AND EDUCATION - NO INJURY: * From what you have said, you had a minor fall.

## 2024-03-23 NOTE — Telephone Encounter (Signed)
 Pt seen in ED

## 2024-03-25 NOTE — Addendum Note (Signed)
 Addended by: VICCI SELLER A on: 03/25/2024 04:09 PM   Modules accepted: Orders

## 2024-03-25 NOTE — Progress Notes (Signed)
 [facility] Summary Report / Loop Recorder

## 2024-04-01 ENCOUNTER — Ambulatory Visit (INDEPENDENT_AMBULATORY_CARE_PROVIDER_SITE_OTHER): Payer: Self-pay

## 2024-04-01 ENCOUNTER — Ambulatory Visit: Payer: Self-pay | Admitting: Cardiology

## 2024-04-01 DIAGNOSIS — I639 Cerebral infarction, unspecified: Secondary | ICD-10-CM

## 2024-04-01 LAB — CUP PACEART REMOTE DEVICE CHECK
Date Time Interrogation Session: 20250821095804
Implantable Pulse Generator Implant Date: 20241101

## 2024-04-15 ENCOUNTER — Other Ambulatory Visit: Payer: Self-pay | Admitting: Medical

## 2024-04-18 ENCOUNTER — Ambulatory Visit
Admission: EM | Admit: 2024-04-18 | Discharge: 2024-04-18 | Disposition: A | Discharge status: Home / Self Care | Attending: Family Medicine | Admitting: Family Medicine

## 2024-04-18 ENCOUNTER — Ambulatory Visit (INDEPENDENT_AMBULATORY_CARE_PROVIDER_SITE_OTHER)

## 2024-04-18 DIAGNOSIS — R059 Cough, unspecified: Secondary | ICD-10-CM

## 2024-04-18 DIAGNOSIS — J069 Acute upper respiratory infection, unspecified: Secondary | ICD-10-CM

## 2024-04-18 DIAGNOSIS — I700 Atherosclerosis of aorta: Secondary | ICD-10-CM | POA: Diagnosis not present

## 2024-04-18 LAB — POCT INFLUENZA A/B
Influenza A, POC: NEGATIVE
Influenza B, POC: NEGATIVE

## 2024-04-18 LAB — POC SARS CORONAVIRUS 2 AG -  ED: SARS Coronavirus 2 Ag: NEGATIVE

## 2024-04-18 MED ORDER — AMOXICILLIN-POT CLAVULANATE 875-125 MG PO TABS: 1.0000 | ORAL_TABLET | Freq: Two times a day (BID) | ORAL | 0 refills | Status: DC

## 2024-04-18 MED ORDER — PREDNISONE 20 MG PO TABS: ORAL_TABLET | ORAL | 0 refills | Status: DC

## 2024-04-18 NOTE — ED Triage Notes (Addendum)
 Pt presents to [facility] with co body aches, chest pain, sob, cough and concern since Friday. Pt reports she has taken tylenol  for her pain.  Pt grandson is sick with the crud and son aslo sick. Son was here yesterday and was neg for covid and flu

## 2024-04-18 NOTE — Discharge Instructions (Addendum)
 Advised patient/son take medications as directed with food to completion.  Advised to take prednisone  with first dose of Augmentin  for the next 5 of 7 days.  Encouraged increase daily water intake to 32 ounces per day while taking this medication.  Advised patient if symptoms worsen and/or unresolved please follow-up with your PCP or here for further evaluation.

## 2024-04-18 NOTE — ED Provider Notes (Signed)
 [facility]    CSN: [id number] Arrival date & time: 04/18/24  1156      History   Chief Complaint Chief Complaint  Patient presents with   Cough   Generalized Body Aches   Shortness of Breath    HPI Leslie Ward is a 89 y.o. female.   HPI 89 year old female presents with cough, generalized bodyaches, and shortness of breath.  Patient is accompanied by her son today.  PMH significant for PAF, stroke, and HTN.  Patient currently on apixaban  and denies any unusual bleeding.  Past Medical History:  Diagnosis Date   Acute encephalopathy 06/11/2023   Adhesive capsulitis of left shoulder 12/22/2018   Bilateral shoulder pain 08/24/2021   Blepharitis of upper and lower eyelids of both eyes 01/07/2020   BPPV (benign paroxysmal positional vertigo), right 01/03/2021   Cerebrovascular accident (CVA) (HCC) 10/25/2021   Cervical dystonia 03/08/2019   Xeomin  approved diagnosis.   COVID 05/29/2021   Dermatochalasis of both upper eyelids 01/07/2020   Dystonia    Edema 02/11/2019   Gait abnormality 10/13/2023   Ganglion cyst 08/25/2020   Glaucoma    History of stroke 10/2021   Hypercoagulable state due to paroxysmal atrial fibrillation (HCC) 07/18/2023   Hyperlipidemia 02/11/2019   Hypertension    Hypertensive retinopathy of both eyes 01/07/2020   Hypothyroidism (acquired) 01/19/2019   Inflammatory arthritis 08/24/2021   Macular RPE mottling 01/07/2020   Meibomian gland dysfunction (MGD) of both eyes 01/07/2020   Mixed incontinence 03/15/2020   Formatting of this note might be different from the original.  Added automatically from request for surgery [id number]   Neck pain 01/19/2019   Osteoarthritis of right knee 12/18/2022   Paroxysmal atrial fibrillation (HCC) 07/18/2023   Post-COVID chronic fatigue 05/29/2021   Post-COVID chronic joint pain 05/29/2021   Post-COVID chronic muscle pain 05/29/2021   Primary open angle glaucoma (POAG) of both eyes, moderate stage  09/12/2021   Sedimentation rate elevation 08/24/2021   Stroke (HCC) 10/2021   Stroke (HCC) 05/2023   Syncope 10/09/2021   Thyroid  disease    Type 2 diabetes mellitus (HCC) 01/19/2019   Urgency incontinence 03/15/2020   Formatting of this note might be different from the original.  Added automatically from request for surgery [id number]   Urinary frequency 08/24/2021   Vertigo 12/01/2019   Vitreous syneresis of both eyes 01/07/2020    Patient Active Problem List   Diagnosis Date Noted   Gait abnormality 10/13/2023   Paroxysmal atrial fibrillation (HCC) 07/18/2023   Hypercoagulable state due to paroxysmal atrial fibrillation (HCC) 07/18/2023   Acute encephalopathy 06/11/2023   Osteoarthritis of right knee 12/18/2022   Glaucoma 11/26/2021   Cerebrovascular accident (CVA) (HCC) 10/25/2021   History of stroke 10/2021   Stroke (HCC) 10/2021   Syncope 10/09/2021   Primary open angle glaucoma (POAG) of both eyes, moderate stage 09/12/2021   Sedimentation rate elevation 08/24/2021   Bilateral shoulder pain 08/24/2021   Inflammatory arthritis 08/24/2021   Urinary frequency 08/24/2021   COVID 05/29/2021   Post-COVID chronic fatigue 05/29/2021   Post-COVID chronic joint pain 05/29/2021   Post-COVID chronic muscle pain 05/29/2021   BPPV (benign paroxysmal positional vertigo), right 01/03/2021   Thyroid  disease    Ganglion cyst 08/25/2020   Mixed incontinence 03/15/2020   Urgency incontinence 03/15/2020   Blepharitis of upper and lower eyelids of both eyes 01/07/2020   Dermatochalasis of both upper eyelids 01/07/2020   Hypertensive retinopathy of both eyes 01/07/2020   Macular RPE mottling 01/07/2020  Meibomian gland dysfunction (MGD) of both eyes 01/07/2020   Vitreous syneresis of both eyes 01/07/2020   Vertigo 12/01/2019   Cervical dystonia 03/08/2019   Hyperlipidemia 02/11/2019   Edema 02/11/2019   Hypertension 01/19/2019   Type 2 diabetes mellitus (HCC) 01/19/2019    Hypothyroidism (acquired) 01/19/2019   Neck pain 01/19/2019   Dystonia 01/19/2019   Adhesive capsulitis of left shoulder 12/22/2018    Past Surgical History:  Procedure Laterality Date   LOOP RECORDER INSERTION N/A 06/13/2023   Procedure: LOOP RECORDER INSERTION;  Surgeon: Inocencio Soyla Lunger, MD;  Location: [facility]ASIVE [facility];  Service: Cardiovascular;  Laterality: N/A;    OB History   No obstetric history on file.      Home Medications    Prior to Admission medications   Medication Sig Start Date End Date Taking? Authorizing Provider  amoxicillin -clavulanate (AUGMENTIN ) 875-125 MG tablet Take 1 tablet by mouth every 12 (twelve) hours. 04/18/24  Yes Teddy Sharper, FNP  predniSONE  (DELTASONE ) 20 MG tablet Take 2 tabs PO daily x 5 days. 04/18/24  Yes Teddy Sharper, FNP  amLODipine  (NORVASC ) 5 MG tablet 1 tab po bid 07/09/23   Saguier, [place], PA-C  apixaban  (ELIQUIS ) 2.5 MG TABS tablet TAKE 1 TABLET BY MOUTH 2 TIMES A DAY 09/17/23   Terra Fairy PARAS, PA-C  atorvastatin  (LIPITOR) 20 MG tablet Take 1 tablet (20 mg total) by mouth daily. 07/04/23   Saguier, [place], PA-C  Calcium  Carbonate (CALCIUM  600 PO) Take 1 tablet by mouth daily.    [provider]  Cholecalciferol (VITAMIN D) 125 MCG (5000 UT) CAPS Take 5,000 Units by mouth daily.    [provider]  Cinnamon 500 MG capsule Take 1,000 mg by mouth daily.    [provider]  dorzolamide -timolol  (COSOPT ) 2-0.5 % ophthalmic solution Place 1 drop into both eyes 2 (two) times daily. 10/30/22   [provider]  fluconazole  (DIFLUCAN ) 150 MG tablet Take 1 tablet (150 mg total) by mouth daily. 12/12/23   Saguier, [place], PA-C  hydrocortisone  (ANUSOL -HC) 25 MG suppository Place 1 suppository (25 mg total) rectally 2 (two) times daily. 02/24/24   Saguier, [place], PA-C  latanoprost (XALATAN) 0.005 % ophthalmic solution Place 1 drop into both eyes every evening. 09/12/21   [provider]  levothyroxine   (SYNTHROID ) 50 MCG tablet TAKE 1 TABLET BY MOUTH DAILY BEFORE BREAKFAST 04/16/24   Saguier, [place], PA-C  meclizine  (ANTIVERT ) 25 MG tablet Take 0.5-1 tablets (12.5-25 mg total) by mouth 3 (three) times daily as needed for dizziness. 10/10/23   Harris, Abigail, PA-C  Menaquinone-7 (VITAMIN K2 PO) Take 1 tablet by mouth at bedtime.    [provider]  metoprolol  tartrate (LOPRESSOR ) 50 MG tablet TAKE 1 TABLET BY MOUTH 2 TIMES A DAY 02/25/24   Saguier, [place], PA-C  mupirocin  ointment (BACTROBAN ) 2 % Apply 1 Application topically 2 (two) times daily. 02/24/24   Saguier, Dallas, PA-C  Phytosterol Esters (CHOLEST CARE PO) Take 1,600 mg by mouth daily.    [provider]  Probiotic Product (PROBIOTIC DAILY PO) Take 1 tablet by mouth at bedtime.    [provider]  vitamin C (ASCORBIC ACID) 500 MG tablet Take 500 mg by mouth daily.    [provider]    Family History Family History  Problem Relation Age of Onset   Stroke Mother    Heart attack Father    Heart Problems Brother    High blood pressure Maternal Grandmother    Liver disease Neg  Hx    Colon cancer Neg Hx    Esophageal cancer Neg Hx     Social History Social History   Tobacco Use   Smoking status: Never    Passive exposure: Past   Smokeless tobacco: Never   Tobacco comments:    Never smoker 07/18/23  Vaping Use   Vaping status: Never Used  Substance Use Topics   Alcohol use: No   Drug use: No     Allergies   Crestor [rosuvastatin calcium ]   Review of Systems Review of Systems  HENT:  Positive for congestion.   Respiratory:  Positive for cough and shortness of breath.   Cardiovascular:  Positive for chest pain.  Musculoskeletal:  Positive for arthralgias and myalgias.  All other systems reviewed and are negative.    Physical Exam Triage Vital Signs ED Triage Vitals  Encounter Vitals Group     BP      Girls Systolic BP Percentile      Girls Diastolic BP Percentile      Boys  Systolic BP Percentile      Boys Diastolic BP Percentile      Pulse      Resp      Temp      Temp src      SpO2      Weight      Height      Head Circumference      Peak Flow      Pain Score      Pain Loc      Pain Education      Exclude from Growth Chart    No data found.  Updated Vital Signs BP (P) 135/66   Pulse (P) 86   Temp (P) 98.8 F (37.1 C)   Resp (P) 19   SpO2 (P) 98%    Physical Exam Vitals and nursing note reviewed.  Constitutional:      Appearance: Normal appearance. She is ill-appearing.  HENT:     Head: Normocephalic and atraumatic.     Right Ear: Tympanic membrane, ear canal and external ear normal.     Left Ear: Tympanic membrane, ear canal and external ear normal.     Nose: Nose normal.     Mouth/Throat:     Mouth: Mucous membranes are moist.     Pharynx: Oropharynx is clear.  Eyes:     Extraocular Movements: Extraocular movements intact.     Pupils: Pupils are equal, round, and reactive to light.  Cardiovascular:     Rate and Rhythm: Normal rate and regular rhythm.     Pulses: Normal pulses.     Heart sounds: Normal heart sounds.  Pulmonary:     Effort: Pulmonary effort is normal.     Breath sounds: No wheezing, rhonchi or rales.     Comments: Diminished breath sounds noted throughout, with infrequent cough on exam Musculoskeletal:        General: Normal range of motion.  Skin:    General: Skin is warm and dry.  Neurological:     General: No focal deficit present.     Mental Status: She is alert and oriented to person, place, and time. Mental status is at baseline.  Psychiatric:        Mood and Affect: Mood normal.        Behavior: Behavior normal.        Thought Content: Thought content normal.      UC Treatments / Results  Labs (all  labs ordered are listed, but only abnormal results are displayed) Labs Reviewed  POCT INFLUENZA A/B  POC SARS CORONAVIRUS 2 AG -  ED    EKG   Radiology DG Chest 2 View Result Date:  04/18/2024 CLINICAL DATA:  Cough for 3 days. EXAM: CHEST - 2 VIEW COMPARISON:  07/09/2023, 03/21/2024. FINDINGS: The heart size and mediastinal contours are within normal limits. There is atherosclerotic calcification of the aorta. Interstitial prominence is unchanged and likely chronic. No consolidation, effusion, or pneumothorax is seen. A loop recorder device is present over the left chest. No acute osseous abnormality. IMPRESSION: No active cardiopulmonary disease. Electronically Signed   By: Leita Birmingham M.D.   On: 04/18/2024 14:39    Procedures Procedures (including critical care time)  Medications Ordered in UC Medications - No data to display  Initial Impression / Assessment and Plan / UC Course  I have reviewed the triage vital signs and the nursing notes.  Pertinent labs & imaging results that were available during my care of the patient were reviewed by me and considered in my medical decision making (see chart for details).     MDM: 1.  Acute URI-Rx'd Augmentin  875/125 mg tablet: Take 1 tablet twice daily x 7 days; 2.  Cough, unspecified type-Rx prednisone  20 mg tablet: Take 3 tablets p.o. daily x 5 days. Advised patient/son take medications as directed with food to completion.  Advised to take prednisone  with first dose of Augmentin  for the next 5 of 7 days.  Encouraged increase daily water intake to 32 ounces per day while taking this medication.  Advised patient if symptoms worsen and/or unresolved please follow-up with your PCP or here for further evaluation.  Patient discharged home, hemodynamically stable. Final Clinical Impressions(s) / UC Diagnoses   Final diagnoses:  Cough, unspecified type  Acute URI     Discharge Instructions      Advised patient/son take medications as directed with food to completion.  Advised to take prednisone  with first dose of Augmentin  for the next 5 of 7 days.  Encouraged increase daily water intake to 32 ounces per day while taking this  medication.  Advised patient if symptoms worsen and/or unresolved please follow-up with your PCP or here for further evaluation.     ED Prescriptions     Medication Sig Dispense Auth. Provider   amoxicillin -clavulanate (AUGMENTIN ) 875-125 MG tablet Take 1 tablet by mouth every 12 (twelve) hours. 14 tablet Elda Dunkerson, FNP   predniSONE  (DELTASONE ) 20 MG tablet Take 2 tabs PO daily x 5 days. 10 tablet Dayzha Pogosyan, FNP      PDMP not reviewed this encounter.   Teddy Sharper, FNP 04/18/24 [phone]

## 2024-04-19 ENCOUNTER — Telehealth: Payer: Self-pay

## 2024-04-19 NOTE — Telephone Encounter (Signed)
Called to check on patient. No answer. Left voicemail.

## 2024-04-20 ENCOUNTER — Encounter: Payer: Self-pay | Admitting: Medical

## 2024-04-21 MED ORDER — BENZONATATE 100 MG PO CAPS: 100.0000 mg | ORAL_CAPSULE | Freq: Three times a day (TID) | ORAL | 0 refills | Status: DC | PRN

## 2024-04-21 NOTE — Addendum Note (Signed)
 Addended by: DORINA DALLAS HERO on: 04/21/2024 06:57 AM   Modules accepted: Orders

## 2024-04-22 MED ORDER — GUAIFENESIN-CODEINE 100-10 MG/5ML PO SOLN: 5.0000 mL | Freq: Four times a day (QID) | ORAL | 0 refills | Status: DC | PRN

## 2024-04-22 NOTE — Addendum Note (Signed)
 Addended by: DORINA DALLAS HERO on: 04/22/2024 09:29 PM   Modules accepted: Orders

## 2024-04-27 ENCOUNTER — Ambulatory Visit: Admitting: Medical

## 2024-04-30 ENCOUNTER — Encounter: Payer: Self-pay | Admitting: Medical

## 2024-04-30 ENCOUNTER — Ambulatory Visit: Admitting: Medical

## 2024-04-30 VITALS — BP 130/70 | HR 71 | Temp 98.2°F | Resp 15 | Ht <= 58 in | Wt 118.4 lb

## 2024-04-30 DIAGNOSIS — J069 Acute upper respiratory infection, unspecified: Secondary | ICD-10-CM

## 2024-04-30 MED ORDER — FLUTICASONE PROPIONATE 50 MCG/ACT NA SUSP: 2.0000 | Freq: Every day | NASAL | 1 refills | Status: AC

## 2024-04-30 NOTE — Progress Notes (Signed)
   Subjective:    Patient ID: Leslie Ward, female    DOB: [birth year], 89 y.o.   MRN: [id number]  HPI [name] is a 89 year old female who presents with persistent cough and nasal congestion following a respiratory infection.  She experienced significant coughing and sneezing for more than two weeks, initially seeking care at urgent care on September 7th. At that time, she was diagnosed with a respiratory infection and treated with prednisone  and Augmentin , which caused stomach upset.  She was also prescribed benzonatate , which did not alleviate her symptoms, and later Robitussin AC, which she found effective for her cough. However, the Robitussin AC was not covered by insurance and had to be paid out of pocket.  She continues to experience nasal congestion and describes her voice as 'nasally'. It is unclear whether she is currently using Flonase  for her nasal symptoms.  No fevers, chills, or sweats. Her appetite remains good, and she reports no significant changes in her eating habits. Persistent nasal congestion.   Review of Systems  Constitutional:  Negative for chills and fatigue.  HENT:  Positive for congestion. Negative for facial swelling, nosebleeds, postnasal drip and sinus pressure.        Objective:   Physical Exam  General- No acute distress. Pleasant patient. Neck- Full range of motion, no jvd Lungs- Clear, even and unlabored. Heart- regular rate and rhythm. Neurologic- CNII- XII grossly intact.  Lower ext- calfs symemtric. Negative homans sign, no pedal edema. Heent- mild congested. No sinus pressure.     Assessment & Plan:   Patient Instructions  Acute upper respiratory infection with cough and nasal congestion. Now Diagnosed with acute upper respiratory infection. Treated with prednisone  and Augmentin  to prevent complications apparently on UC note review. Symptoms improved, no wheezing or shortness of breath. Lungs clear, no repeat chest x-ray needed.  Persistent minmal nasal congestion. - Prescribe Flonase , two sprays each nostril once daily in the morning. - Advise using saline nasal spray in the evening.  If signs/symptoms worsen or change notify us .  Follow up 1-2 weeks can schedule nurse visit for flu and pcv 20 vaccine. -can do shingrix and tetanus later thru pharmacy,  -also consider covid vaccine within next month. Davi Kroon, PA-C

## 2024-04-30 NOTE — Patient Instructions (Signed)
 Acute upper respiratory infection with cough and nasal congestion. Now Diagnosed with acute upper respiratory infection. Treated with prednisone  and Augmentin  to prevent complications apparently on UC note review. Symptoms improved, no wheezing or shortness of breath. Lungs clear, no repeat chest x-ray needed. Persistent minmal nasal congestion. - Prescribe Flonase , two sprays each nostril once daily in the morning. - Advise using saline nasal spray in the evening.  If signs/symptoms worsen or change notify us .  Follow up 1-2 weeks can schedule nurse visit for flu and pcv 20 vaccine. -can do shingrix and tetanus later thru pharmacy,

## 2024-05-02 ENCOUNTER — Other Ambulatory Visit: Payer: Self-pay | Admitting: Medical

## 2024-05-02 ENCOUNTER — Other Ambulatory Visit: Payer: Self-pay | Admitting: Cardiology

## 2024-05-03 ENCOUNTER — Encounter

## 2024-05-04 NOTE — Progress Notes (Addendum)
 [facility] - Urogynecology Follow-Up Visit  HPI:  [name] is an 89 y.o. LatinaF with PMHx significant for Hypertension, hyperlipidemia, history of CVA, atrial fibrillation, vertico, DM2 with last A1C 6.1 (2022)   who was last seen on 01/08/2023 by Dr. WARD for  1) Genitourinary syndrome of menopause 2) Refractory overactive bladder with urgency incontinence with InterStim in place with dead battery, s/p Botox October 02, 2021 3) Stress urinary incontinence 4) Bowel accidents   At her last visit we planned for  - Provide prescription for estrogen cream with instructions to apply at least 3 times per week.  -Advise monitoring and adjusting caffeine  intake as necessary.  -Report any changes in urinary frequency and urgency following treatment.  History of Present Illness The patient presents for evaluation of incontinence.  She has been experiencing incontinence, which worsened during a recent respiratory illness that lasted about a month. She often reports burning sensations and struggles to reach the bathroom in time, necessitating the use of diapers. This issue has been ongoing for some time. She does not perform Kegel exercises. She is currently on Eliquis  for atrial fibrillation. She was previously prescribed estrogen cream in May 2025 but has not refilled it. She underwent various diagnostic tests including ultrasounds, x-rays, and CT scans, all of which returned normal results. It was suggested that her stimulator might be causing the pain, so it was turned off, leading to a cessation of the pain. However, she was advised against frequent adjustments of the stimulator. A urine test conducted at an urgent care center showed no signs of infection. The stimulator is currently active and has not been adjusted since May 2025. She received Botox treatment in 2023, which seemed to alleviate her symptoms.  Medical History[1]  Surgical History[2]   EXAM Vitals:   05/04/24 1403   BP: 132/69  BP Location: Left arm  Patient Position: Sitting  Pulse: 73  SpO2: 99%     Urinalysis: Urinalysis results:  Lab Results  Component Value Date   COLORU Yellow 01/08/2024   CLARITYU Clear 01/08/2024   SPECGRAV 1.015 01/08/2024   PHUR 6.0 01/08/2024   PROTEINUA Negative 01/08/2024   GLUCOSEU Negative 01/08/2024   KETONEU Trace (A) 01/08/2024   BILIRUBINUR Negative 01/08/2024   BLOODU Trace (A) 01/08/2024   UROBILINOGEN 0.2 01/08/2024   LEUKOUA Small (A) 01/08/2024   RBCU 3-5 (A) 01/08/2024   WBCU 13-20 (A) 01/08/2024   SQUAMEPICUA 0-5 01/08/2024   BACTERIA None Seen 01/08/2024   HYALCASTSCUA 6-10 (A) 01/08/2024   Physical Exam  General appearance: well nourished, well hydrated, no acute distress  ASSESSMENT: 89 y.o. significant for Hypertension, hyperlipidemia, history of CVA, atrial fibrillation, vertico, DM2 with last A1C 6.1 (2022)   who was last seen on 01/08/2023 by Dr. WARD for  1) Genitourinary syndrome of menopause, forgetting to use topical estrogen cream 2) Refractory overactive bladder with urgency incontinence with InterStim in place with dead battery, s/p Botox October 02, 2021  3) Stress urinary incontinence, no intervention thus far 4) Bowel accidents  Assessment & Plan 1. Refractory OAB with stress UI symptoms Incontinence is likely due to urethral dysfunction and bladder overactivity. The InterStim device is currently addressing one aspect of her incontinence.  - The urethra has not been specifically targeted for stress incontinence treatment. Discussed option for intervention with periurethral bulking. She and her family prioritizes trying to minimize UI and accepts risk associated with this procedure. Will have anesthesia consultation to allow for optimization.  - The possibility of  intravesical Botox injections 100 U every 3-6 months was discussed, along with the potential need for periurethral bulking to strengthen the urethra. A prescription for a  5-day course of antibiotics was provided, to be taken 3 days prior to the procedure to prevent bacterial overgrowth. She was instructed to discontinue Eliquis  24 hours before the procedure. A voiding trial will be conducted post-procedure to ensure proper bladder emptying.  Alberta Batman, MD  Associate Professor, Urogynecology  Department of Urology, [facility]       [1] Past Medical History: Diagnosis Date  . Glaucoma   . Hypertension   . Macular degeneration   . Stroke    (CMD)   . Vertigo   [2] Past Surgical History: Procedure Laterality Date  . CATARACT EXTRACTION Bilateral 2017   Procedure: CATARACT EXTRACTION; [place]   . CATARACT EXTRACTION Right 02/22/2019   Procedure: YAG CAPSULOTOMY  . CATARACT EXTRACTION Left 03/08/2019   Procedure: YAG CAPSULOTOMY  . INTERSTIM GENERATOR PLACEMENT N/A 04/13/2020   Procedure: RENNA PLACEMENT STAGE 1 & 2-MEDTRONICS;  Surgeon: Alberta Jamee Batman, MD;  Location: [facility] OUTPATIENT OR;  Service: Gynecology;  Laterality: N/A;  . INTERSTIM IMPLANT REMOVAL N/A 04/13/2020   Procedure: RENNA IMPLANT REMOVAL;  Surgeon: Alberta Jamee Batman, MD;  Location: [facility] OUTPATIENT OR;  Service: Gynecology;  Laterality: N/A;

## 2024-05-04 NOTE — Progress Notes (Signed)
 Remote Loop Recorder Transmission

## 2024-05-06 NOTE — Telephone Encounter (Signed)
 Case Completed, left detailed message:  Pre-operative Assessment Date : 11/20 @ 10AM Surgery Date: 07/15/2024 Surgery Location: [place] Surgery packet mailed

## 2024-05-14 ENCOUNTER — Other Ambulatory Visit: Payer: Self-pay | Admitting: Medical

## 2024-05-17 NOTE — Progress Notes (Signed)
 Remote Loop Recorder Transmission

## 2024-05-21 ENCOUNTER — Other Ambulatory Visit: Payer: Self-pay | Admitting: Medical

## 2024-06-01 ENCOUNTER — Encounter

## 2024-06-03 ENCOUNTER — Encounter

## 2024-06-03 ENCOUNTER — Ambulatory Visit: Attending: Medical

## 2024-06-10 NOTE — Telephone Encounter (Signed)
 Case Completed, patient notified:  Pre-operative Assessment Date : pat requested Surgery Date: 07/07/2024 Surgery Location: [facility] Surgery packet mailed

## 2024-06-10 NOTE — Telephone Encounter (Signed)
 Message routed to patient's portal regarding Bulkamid gel.

## 2024-06-16 ENCOUNTER — Other Ambulatory Visit: Payer: Self-pay | Admitting: Medical

## 2024-06-28 NOTE — Telephone Encounter (Signed)
 Noted in chart.

## 2024-07-02 ENCOUNTER — Encounter

## 2024-07-04 ENCOUNTER — Ambulatory Visit

## 2024-07-04 DIAGNOSIS — I480 Paroxysmal atrial fibrillation: Secondary | ICD-10-CM

## 2024-07-05 ENCOUNTER — Encounter

## 2024-07-06 LAB — CUP PACEART REMOTE DEVICE CHECK
Date Time Interrogation Session: 20251122232832
Implantable Pulse Generator Implant Date: 20241101

## 2024-07-06 NOTE — Progress Notes (Signed)
 Remote Loop Recorder Transmission

## 2024-07-07 ENCOUNTER — Ambulatory Visit: Payer: Self-pay | Admitting: Cardiology

## 2024-07-13 ENCOUNTER — Other Ambulatory Visit: Payer: Self-pay | Admitting: Medical

## 2024-07-17 ENCOUNTER — Inpatient Hospital Stay (HOSPITAL_COMMUNITY)
Admission: EM | Admit: 2024-07-17 | Discharge: 2024-07-20 | DRG: 291 | Disposition: A | Discharge status: Home / Self Care | Attending: Internal Medicine | Admitting: Internal Medicine

## 2024-07-17 ENCOUNTER — Emergency Department (HOSPITAL_COMMUNITY)

## 2024-07-17 ENCOUNTER — Other Ambulatory Visit: Payer: Self-pay

## 2024-07-17 ENCOUNTER — Inpatient Hospital Stay (HOSPITAL_COMMUNITY)

## 2024-07-17 ENCOUNTER — Encounter (HOSPITAL_COMMUNITY): Payer: Self-pay

## 2024-07-17 DIAGNOSIS — I5033 Acute on chronic diastolic (congestive) heart failure: Secondary | ICD-10-CM | POA: Insufficient documentation

## 2024-07-17 DIAGNOSIS — D72829 Elevated white blood cell count, unspecified: Secondary | ICD-10-CM

## 2024-07-17 DIAGNOSIS — E785 Hyperlipidemia, unspecified: Secondary | ICD-10-CM | POA: Diagnosis present

## 2024-07-17 DIAGNOSIS — I480 Paroxysmal atrial fibrillation: Secondary | ICD-10-CM | POA: Diagnosis present

## 2024-07-17 DIAGNOSIS — E119 Type 2 diabetes mellitus without complications: Secondary | ICD-10-CM

## 2024-07-17 DIAGNOSIS — J9601 Acute respiratory failure with hypoxia: Secondary | ICD-10-CM | POA: Diagnosis present

## 2024-07-17 DIAGNOSIS — Z8673 Personal history of transient ischemic attack (TIA), and cerebral infarction without residual deficits: Secondary | ICD-10-CM

## 2024-07-17 DIAGNOSIS — R0603 Acute respiratory distress: Principal | ICD-10-CM

## 2024-07-17 DIAGNOSIS — E039 Hypothyroidism, unspecified: Secondary | ICD-10-CM | POA: Diagnosis present

## 2024-07-17 DIAGNOSIS — N3946 Mixed incontinence: Secondary | ICD-10-CM | POA: Diagnosis present

## 2024-07-17 DIAGNOSIS — I10 Essential (primary) hypertension: Secondary | ICD-10-CM | POA: Diagnosis present

## 2024-07-17 HISTORY — DX: Elevated white blood cell count, unspecified: D72.829

## 2024-07-17 HISTORY — DX: Acute on chronic diastolic (congestive) heart failure: I50.33

## 2024-07-17 LAB — CBC
HCT: 38.9 % (ref 36.0–46.0)
Hemoglobin: 12.5 g/dL (ref 12.0–15.0)
MCH: 28.5 pg (ref 26.0–34.0)
MCHC: 32.1 g/dL (ref 30.0–36.0)
MCV: 88.6 fL (ref 80.0–100.0)
Platelets: 329 K/uL (ref 150–400)
RBC: 4.39 MIL/uL (ref 3.87–5.11)
RDW: 15.9 % — ABNORMAL HIGH (ref 11.5–15.5)
WBC: 18.8 K/uL — ABNORMAL HIGH (ref 4.0–10.5)
nRBC: 0 % (ref 0.0–0.2)

## 2024-07-17 LAB — RESPIRATORY PANEL BY PCR

## 2024-07-17 LAB — D-DIMER, QUANTITATIVE: D-Dimer, Quant: 0.68 ug{FEU}/mL — ABNORMAL HIGH (ref 0.00–0.50)

## 2024-07-17 LAB — RESP PANEL BY RT-PCR (RSV, FLU A&B, COVID)  RVPGX2
Influenza A by PCR: NEGATIVE
Influenza B by PCR: NEGATIVE
Resp Syncytial Virus by PCR: NEGATIVE
SARS Coronavirus 2 by RT PCR: NEGATIVE

## 2024-07-17 LAB — HEPATIC FUNCTION PANEL
ALT: 21 U/L (ref 0–44)
AST: 29 U/L (ref 15–41)
Albumin: 4.1 g/dL (ref 3.5–5.0)
Alkaline Phosphatase: 101 U/L (ref 38–126)
Bilirubin, Direct: 0.4 mg/dL — ABNORMAL HIGH (ref 0.0–0.2)
Indirect Bilirubin: 0.8 mg/dL (ref 0.3–0.9)
Total Bilirubin: 1.1 mg/dL (ref 0.0–1.2)
Total Protein: 8.3 g/dL — ABNORMAL HIGH (ref 6.5–8.1)

## 2024-07-17 LAB — BASIC METABOLIC PANEL WITH GFR
Anion gap: 12 (ref 5–15)
BUN: 17 mg/dL (ref 8–23)
CO2: 22 mmol/L (ref 22–32)
Calcium: 9.5 mg/dL (ref 8.9–10.3)
Chloride: 106 mmol/L (ref 98–111)
Creatinine, Ser: 0.68 mg/dL (ref 0.44–1.00)
GFR, Estimated: >60 mL/min (ref >60)
Glucose, Bld: 168 mg/dL — ABNORMAL HIGH (ref 70–99)
Potassium: 3.8 mmol/L (ref 3.5–5.1)
Sodium: 139 mmol/L (ref 135–145)

## 2024-07-17 LAB — ECHOCARDIOGRAM COMPLETE
Area-P 1/2: 3.28 cm2
Height: 57 in
S' Lateral: 2.2 cm
Weight: 1968.27 [oz_av]

## 2024-07-17 LAB — TROPONIN T, HIGH SENSITIVITY
Troponin T High Sensitivity: <15 ng/L (ref 0–19)
Troponin T High Sensitivity: <15 ng/L (ref 0–19)

## 2024-07-17 LAB — URINALYSIS, ROUTINE W REFLEX MICROSCOPIC
Bilirubin Urine: NEGATIVE
Glucose, UA: NEGATIVE mg/dL
Hgb urine dipstick: NEGATIVE
Ketones, ur: NEGATIVE mg/dL
Leukocytes,Ua: NEGATIVE
Nitrite: NEGATIVE
Protein, ur: NEGATIVE mg/dL
Specific Gravity, Urine: 1.012 (ref 1.005–1.030)
pH: 6 (ref 5.0–8.0)

## 2024-07-17 LAB — GLUCOSE, CAPILLARY
Glucose-Capillary: 189 mg/dL — ABNORMAL HIGH (ref 70–99)
Glucose-Capillary: 168 mg/dL — ABNORMAL HIGH (ref 70–99)

## 2024-07-17 LAB — MAGNESIUM: Magnesium: 2.6 mg/dL — ABNORMAL HIGH (ref 1.7–2.4)

## 2024-07-17 LAB — TSH: TSH: 1.13 u[IU]/mL (ref 0.350–4.500)

## 2024-07-17 LAB — CBG MONITORING, ED
Glucose-Capillary: 171 mg/dL — ABNORMAL HIGH (ref 70–99)
Glucose-Capillary: 152 mg/dL — ABNORMAL HIGH (ref 70–99)

## 2024-07-17 LAB — PRO BRAIN NATRIURETIC PEPTIDE: Pro Brain Natriuretic Peptide: 814 pg/mL — ABNORMAL HIGH (ref <300.0)

## 2024-07-17 LAB — T4, FREE: Free T4: 0.96 ng/dL (ref 0.61–1.12)

## 2024-07-17 MED ORDER — FUROSEMIDE 10 MG/ML IJ SOLN
40.0000 mg | Freq: Once | INTRAMUSCULAR | Status: AC
  Administered 2024-07-17: 40 mg via INTRAVENOUS
  Filled 2024-07-17: qty 4

## 2024-07-17 MED ORDER — APIXABAN 2.5 MG PO TABS
2.5000 mg | ORAL_TABLET | Freq: Two times a day (BID) | ORAL | Status: DC
  Administered 2024-07-17 – 2024-07-20 (×7): 2.5 mg via ORAL
  Filled 2024-07-17 (×8): qty 1

## 2024-07-17 MED ORDER — ONDANSETRON HCL 4 MG/2ML IJ SOLN: 4.0000 mg | Freq: Four times a day (QID) | INTRAMUSCULAR | Status: DC | PRN

## 2024-07-17 MED ORDER — FUROSEMIDE 10 MG/ML IJ SOLN
40.0000 mg | Freq: Every day | INTRAMUSCULAR | Status: DC
  Administered 2024-07-18: 40 mg via INTRAVENOUS
  Filled 2024-07-17: qty 4

## 2024-07-17 MED ORDER — POLYVINYL ALCOHOL 1.4 % OP SOLN
1.0000 [drp] | Freq: Two times a day (BID) | OPHTHALMIC | Status: DC
  Administered 2024-07-17 – 2024-07-20 (×6): 1 [drp] via OPHTHALMIC
  Filled 2024-07-17: qty 15

## 2024-07-17 MED ORDER — TIMOLOL MALEATE 0.5 % OP SOLN
1.0000 [drp] | Freq: Two times a day (BID) | OPHTHALMIC | Status: DC
  Administered 2024-07-17 – 2024-07-20 (×6): 1 [drp] via OPHTHALMIC
  Filled 2024-07-17: qty 5

## 2024-07-17 MED ORDER — INSULIN ASPART 100 UNIT/ML IJ SOLN
0.0000 [IU] | Freq: Three times a day (TID) | INTRAMUSCULAR | Status: DC
  Administered 2024-07-17 (×3): 2 [IU] via SUBCUTANEOUS
  Administered 2024-07-18 – 2024-07-19 (×4): 1 [IU] via SUBCUTANEOUS
  Filled 2024-07-17: qty 1
  Filled 2024-07-17 (×3): qty 2
  Filled 2024-07-17 (×3): qty 1

## 2024-07-17 MED ORDER — ACETAMINOPHEN 650 MG RE SUPP: 650.0000 mg | Freq: Four times a day (QID) | RECTAL | Status: DC | PRN

## 2024-07-17 MED ORDER — AMLODIPINE BESYLATE 5 MG PO TABS
5.0000 mg | ORAL_TABLET | Freq: Two times a day (BID) | ORAL | Status: DC
  Administered 2024-07-17 – 2024-07-20 (×6): 5 mg via ORAL
  Filled 2024-07-17 (×6): qty 1

## 2024-07-17 MED ORDER — ONDANSETRON HCL 4 MG PO TABS: 4.0000 mg | ORAL_TABLET | Freq: Four times a day (QID) | ORAL | Status: DC | PRN

## 2024-07-17 MED ORDER — DORZOLAMIDE HCL-TIMOLOL MAL 2-0.5 % OP SOLN
1.0000 [drp] | Freq: Two times a day (BID) | OPHTHALMIC | Status: DC
  Filled 2024-07-17: qty 10

## 2024-07-17 MED ORDER — LATANOPROST 0.005 % OP SOLN
1.0000 [drp] | Freq: Every evening | OPHTHALMIC | Status: DC
  Administered 2024-07-17 – 2024-07-19 (×3): 1 [drp] via OPHTHALMIC
  Filled 2024-07-17: qty 2.5

## 2024-07-17 MED ORDER — METOPROLOL TARTRATE 50 MG PO TABS
50.0000 mg | ORAL_TABLET | Freq: Two times a day (BID) | ORAL | Status: DC
  Administered 2024-07-17 – 2024-07-20 (×6): 50 mg via ORAL
  Filled 2024-07-17 (×6): qty 1

## 2024-07-17 MED ORDER — INFLUENZA VAC SPLIT HIGH-DOSE 0.5 ML IM SUSY
0.5000 mL | PREFILLED_SYRINGE | INTRAMUSCULAR | Status: AC
  Administered 2024-07-18: 0.5 mL via INTRAMUSCULAR
  Filled 2024-07-17: qty 0.5

## 2024-07-17 MED ORDER — ACETAMINOPHEN 325 MG PO TABS: 650.0000 mg | ORAL_TABLET | Freq: Four times a day (QID) | ORAL | Status: DC | PRN

## 2024-07-17 MED ORDER — DORZOLAMIDE HCL 2 % OP SOLN
1.0000 [drp] | Freq: Two times a day (BID) | OPHTHALMIC | Status: DC
  Administered 2024-07-17 – 2024-07-20 (×6): 1 [drp] via OPHTHALMIC
  Filled 2024-07-17: qty 10

## 2024-07-17 MED ORDER — IOHEXOL 350 MG/ML SOLN
75.0000 mL | Freq: Once | INTRAVENOUS | Status: AC | PRN
  Administered 2024-07-17: 60 mL via INTRAVENOUS

## 2024-07-17 MED ORDER — LEVOTHYROXINE SODIUM 50 MCG PO TABS
50.0000 ug | ORAL_TABLET | Freq: Every day | ORAL | Status: DC
  Administered 2024-07-18 – 2024-07-20 (×3): 50 ug via ORAL
  Filled 2024-07-17 (×3): qty 1

## 2024-07-17 NOTE — ED Notes (Signed)
 Writer recollected light green tube. Writer wasted 10cc and then redrew lab.

## 2024-07-17 NOTE — ED Notes (Signed)
 Writer called lab and added on troponin and 20 pathogen panel to lab work

## 2024-07-17 NOTE — ED Notes (Signed)
 Lab contacted and writer advised for samples sent to be disregarded. Writer applied wrong label to samples. Writer will draw new samples with correct labeling.

## 2024-07-17 NOTE — ED Notes (Signed)
CBG 152  

## 2024-07-17 NOTE — Assessment & Plan Note (Addendum)
 89 year old presenting to ED with acute onset of shortness of breath with hypoxia to 80% on room air requiring cpap found to be in acute on chronic diastolic CHF exacerbation due to hypoxia, shortness of breath, elevated pro-bnp and findings on imaging of bilateral pleural effusion and pulmonary edema  -admit to progressive -output of 700+cc of urine s/p lasix  and now on 2-3L [place] oxygen  -continue lasix . Given 40mg  in ED, continue this daily as lasix  naive -strict I/O and daily weights -check echo (last echo 05/2023 with EF of 60-65% and grade 1 DD. Moderately dilated left atrium)  -CTA chest negative for PE (also on eliquis , no doses missed)  -check troponin  -check RVP considering possibly cold and sick contacts at home.  Covid/flu and RSV negative  -has ILR, but in NSR and no reports of palpitations/etc.  -continue lopressor  50mg  BID  -adjust GDMT once echo results

## 2024-07-17 NOTE — Assessment & Plan Note (Signed)
 S/p cystoscopy and botox injection on 07/06/24

## 2024-07-17 NOTE — Assessment & Plan Note (Signed)
 No recent thyroid  labs in last 1+ year Check TSH and free T4 Continue home synthroid  dose, 50mcg daily

## 2024-07-17 NOTE — Assessment & Plan Note (Signed)
 In NSR, no reports of palpitations Continue lopressor  and eliquis   On telemetry and has ILR placed in 05/2024 following CVA

## 2024-07-17 NOTE — Assessment & Plan Note (Signed)
 Likely steroid induced as received IV steroids with EMS UA pending, CXR with no pneumonia  Continue to trend

## 2024-07-17 NOTE — ED Notes (Signed)
 Patient transported to X-ray

## 2024-07-17 NOTE — ED Notes (Signed)
 Pt NRB turned down to 6 lpm per respiratory.

## 2024-07-17 NOTE — Progress Notes (Signed)
  Echocardiogram 2D Echocardiogram has been performed.  Tinnie FORBES Gosling RDCS 07/17/2024, 3:20 PM

## 2024-07-17 NOTE — Assessment & Plan Note (Addendum)
 Continue statin and eliquis  She had ILR placed in 05/2024

## 2024-07-17 NOTE — Assessment & Plan Note (Signed)
 A1C of 6.7 in April 2025 Sensitive SSI and accuchecks QAC/HS

## 2024-07-17 NOTE — H&P (Addendum)
 History and Physical    Patient: Leslie Ward FMW:[id number] DOB: Jun 06, 1931 DOA: 07/17/2024 DOS: the patient was seen and examined on 07/17/2024 PCP: Dorina Loving, PA-C  Patient coming from: Home - lives with her son. Uses a walker to ambulate. Very independent, has own apartment at her sons house.     Chief Complaint: shortness of breath with hypoxia   HPI: Armando Lauman is a 89 y.o. female with medical history significant of hx of CVA, PAF, T2DM, HTN, CVA, HLD and hypothyroidism who presented to ED with complaints of shortness of breath and hypoxia. Was in the 80s on room air. EMS placed cpap and gave solumedrol, duonebs and magnesium in route.   Her son gives history. She called her son around 3AM and told him she couldn't breath. He states for the past 2 days she has had a cough and had a cold. He states she seemed to get better quickly. She seemed fine yesterday until this AM. She has been eating and drinking well. She doesn't drink excessive amounts of water (maybe 3-4 bottles of water/day). She does not eat salty foods. Her daughter in law has been sick with a virus. She complained her lungs hurt a few days ago, but no fever/chills. She states she has been coughing at night. This has been dry. She denies any orthopnea, leg swelling or weight gain.   Recently had surgery with cystoscopy with botox injection on 07/07/24 at [facility]. She held her eliquis  3 days for this procedure.   Denies any fever/chills, vision changes/headaches, chest pain or palpitations, abdominal pain, N/V/D, dysuria or leg swelling.   She does not smoke or drink alcohol .   ER Course:  vitals: afebrile, bp: 143/67, HR: 71, RR: 22, oxygen: 98% on cpap (oxygen 80% on RA with EMS)  Pertinent labs: wbc: 18.8, pro-bnp: 814,  d-dimer: .68, resp panel negative  CXR: Small bilateral pleural effusions and mild perihilar pulmonary edema, consistent with mild cardiogenic failure. CTA chest: NO PE, small bilateral  pleural effusions with associated overlying compressive type atelectasis. Possible mild interstitial edema. Aortic atherosclerosis and coronary artery calcifications.  In ED: given 40mg  of IV lasix , TRH asked to admit. urine output: 700cc   Review of Systems: As mentioned in the history of present illness. All other systems reviewed and are negative. Past Medical History:  Diagnosis Date   Acute encephalopathy 06/11/2023   Adhesive capsulitis of left shoulder 12/22/2018   Bilateral shoulder pain 08/24/2021   Blepharitis of upper and lower eyelids of both eyes 01/07/2020   BPPV (benign paroxysmal positional vertigo), right 01/03/2021   Cerebrovascular accident (CVA) (HCC) 10/25/2021   Cervical dystonia 03/08/2019   Xeomin  approved diagnosis.   COVID 05/29/2021   Dermatochalasis of both upper eyelids 01/07/2020   Dystonia    Edema 02/11/2019   Gait abnormality 10/13/2023   Ganglion cyst 08/25/2020   Glaucoma    History of stroke 10/2021   Hypercoagulable state due to paroxysmal atrial fibrillation (HCC) 07/18/2023   Hyperlipidemia 02/11/2019   Hypertension    Hypertensive retinopathy of both eyes 01/07/2020   Hypothyroidism (acquired) 01/19/2019   Inflammatory arthritis 08/24/2021   Macular RPE mottling 01/07/2020   Meibomian gland dysfunction (MGD) of both eyes 01/07/2020   Mixed incontinence 03/15/2020   Formatting of this note might be different from the original.  Added automatically from request for surgery [id number]   Neck pain 01/19/2019   Osteoarthritis of right knee 12/18/2022   Paroxysmal atrial fibrillation (HCC) 07/18/2023   Post-COVID chronic  fatigue 05/29/2021   Post-COVID chronic joint pain 05/29/2021   Post-COVID chronic muscle pain 05/29/2021   Primary open angle glaucoma (POAG) of both eyes, moderate stage 09/12/2021   Sedimentation rate elevation 08/24/2021   Stroke (HCC) 10/2021   Stroke (HCC) 05/2023   Syncope 10/09/2021   Thyroid  disease    Type 2  diabetes mellitus (HCC) 01/19/2019   Urgency incontinence 03/15/2020   Formatting of this note might be different from the original.  Added automatically from request for surgery [id number]   Urinary frequency 08/24/2021   Vertigo 12/01/2019   Vitreous syneresis of both eyes 01/07/2020   Past Surgical History:  Procedure Laterality Date   LOOP RECORDER INSERTION N/A 06/13/2023   Procedure: LOOP RECORDER INSERTION;  Surgeon: Inocencio Soyla Lunger, MD;  Location: [facility];  Service: Cardiovascular;  Laterality: N/A;   Social History:  reports that she has never smoked. She has been exposed to tobacco smoke. She has never used smokeless tobacco. She reports that she does not drink alcohol  and does not use drugs.  Allergies  Allergen Reactions   Crestor [Rosuvastatin Calcium ] Other (See Comments)    caused me to go into kidney failure    Family History  Problem Relation Age of Onset   Stroke Mother    Heart attack Father    Heart Problems Brother    High blood pressure Maternal Grandmother    Liver disease Neg Hx    Colon cancer Neg Hx    Esophageal cancer Neg Hx     Prior to Admission medications   Medication Sig Start Date End Date Taking? Authorizing Provider  estradiol (ESTRACE) 0.01 % CREA vaginal cream APPLY A SMALL AMOUNT VAGINALLY ONCE EVERY NIGHT AT BEDTIME FOR 14 DAYS; THEN CONTINUE TO APPLY EVERY OTHER NIGHT THEREAFTER 07/13/24  Yes [provider]  amLODipine  (NORVASC ) 5 MG tablet 1 tab po bid 07/09/23   Saguier, [place], PA-C  apixaban  (ELIQUIS ) 2.5 MG TABS tablet TAKE 1 TABLET BY MOUTH 2 TIMES A DAY 09/17/23   Terra Fairy PARAS, PA-C  atorvastatin  (LIPITOR) 20 MG tablet Take 1 tablet (20 mg total) by mouth daily. Patient not taking: Reported on 04/30/2024 07/04/23   Saguier, [place], PA-C  benzonatate  (TESSALON ) 100 MG capsule Take 1 capsule (100 mg total) by mouth 3 (three) times daily as needed for cough. Patient not taking: Reported on 04/30/2024 04/21/24    Saguier, [place], PA-C  Calcium  Carbonate (CALCIUM  600 PO) Take 1 tablet by mouth daily.    [provider]  Cholecalciferol (VITAMIN D) 125 MCG (5000 UT) CAPS Take 5,000 Units by mouth daily.    [provider]  Cinnamon 500 MG capsule Take 1,000 mg by mouth daily.    [provider]  dorzolamide -timolol  (COSOPT ) 2-0.5 % ophthalmic solution Place 1 drop into both eyes 2 (two) times daily. 10/30/22   [provider]  fluconazole  (DIFLUCAN ) 150 MG tablet Take 1 tablet (150 mg total) by mouth daily. 12/12/23   Saguier, [place], PA-C  fluticasone  (FLONASE ) 50 MCG/ACT nasal spray Place 2 sprays into both nostrils daily. 04/30/24   Saguier, [place], PA-C  guaiFENesin -codeine  100-10 MG/5ML syrup Take 5 mLs by mouth every 6 (six) hours as needed for cough. 04/22/24   Saguier, [place], PA-C  hydrocortisone  (ANUSOL -HC) 25 MG suppository Place 1 suppository (25 mg total) rectally 2 (two) times daily. 02/24/24   Saguier, Dallas, PA-C  latanoprost  (XALATAN ) 0.005 % ophthalmic solution Place 1 drop into both eyes every evening. 09/12/21  [provider]  levothyroxine  (SYNTHROID ) 50 MCG tablet TAKE 1 TABLET BY MOUTH DAILY BEFORE BREAKFAST 07/13/24   Saguier, [place], PA-C  meclizine  (ANTIVERT ) 25 MG tablet Take 0.5-1 tablets (12.5-25 mg total) by mouth 3 (three) times daily as needed for dizziness. Patient not taking: Reported on 04/30/2024 10/10/23   Harris, Abigail, PA-C  Menaquinone-7 (VITAMIN K2 PO) Take 1 tablet by mouth at bedtime.    [provider]  metoprolol  tartrate (LOPRESSOR ) 50 MG tablet TAKE 1 TABLET BY MOUTH 2 TIMES A DAY 05/21/24   Saguier, Dallas, PA-C  mupirocin  ointment (BACTROBAN ) 2 % Apply 1 Application topically 2 (two) times daily. 02/24/24   Saguier, Dallas, PA-C  Phytosterol Esters (CHOLEST CARE PO) Take 1,600 mg by mouth daily.    [provider]  predniSONE  (DELTASONE ) 20 MG tablet Take 2 tabs PO daily x 5 days. Patient not taking:  Reported on 04/30/2024 04/18/24   Teddy Sharper, FNP  Probiotic Product (PROBIOTIC DAILY PO) Take 1 tablet by mouth at bedtime.    [provider]  vitamin C (ASCORBIC ACID) 500 MG tablet Take 500 mg by mouth daily.    [provider]    Physical Exam: Vitals:   07/17/24 0741 07/17/24 0742 07/17/24 0828 07/17/24 0900  BP:  (!) 124/57  (!) 133/52  Pulse:  76  75  Resp:  16  18  Temp:  97.8 F (36.6 C)    TempSrc:  Oral    SpO2: 100% 100% 98% 98%   General:  Appears calm and comfortable and is in NAD Eyes:  PERRL, EOMI, normal lids, iris ENT:  grossly normal hearing, lips & tongue, mmm; appropriate dentition Neck:  no LAD, masses or thyromegaly; no carotid bruits, no JVD  Cardiovascular:  RRR, no m/r/g. No LE edema.  Respiratory:  bibasilar crackles. Normal effort. No retractions/wheezing. Talking in full sentences  Abdomen:  soft, NT, ND, NABS Back:   normal alignment, no CVAT Skin:  no rash or induration seen on limited exam Musculoskeletal:  grossly normal tone BUE/BLE, good ROM, no bony abnormality Lower extremity:  trace LE edema.  Limited foot exam with no ulcerations.  2+ distal pulses. Psychiatric:  grossly normal mood and affect, speech fluent and appropriate, AOx3 Neurologic:  CN 2-12 grossly intact, moves all extremities in coordinated fashion, sensation intact   Radiological Exams on Admission: Independently reviewed - see discussion in A/P where applicable  CT Angio Chest PE W and/or Wo Contrast Result Date: 07/17/2024 EXAM: CTA of the Chest with contrast for PE 07/17/2024 06:40:47 AM TECHNIQUE: CTA of the chest was performed after the administration of 60 mL of iohexol  (OMNIPAQUE ) 350 MG/ML injection. Multiplanar reformatted images are provided for review. MIP images are provided for review. Automated exposure control, iterative reconstruction, and/or weight based adjustment of the mA/kV was utilized to reduce the radiation dose to as low as reasonably  achievable. COMPARISON: 03/21/2024 CLINICAL HISTORY: Pulmonary embolism (PE) suspected, low to intermediate prob, positive D-dimer. FINDINGS: PULMONARY ARTERIES: Pulmonary arteries are adequately opacified for evaluation. No pulmonary embolism. Main pulmonary artery is normal in caliber. MEDIASTINUM: Atherosclerosis and multivessel coronary artery calcifications. Aberrant right subclavian artery courses posterior to the esophagus. There is no acute abnormality of the thoracic aorta. LYMPH NODES: No mediastinal, hilar or axillary lymphadenopathy. LUNGS AND PLEURA: Small bilateral pleural effusions. Mild increased interlobular septal thickening in the lung bases. Compressive-type atelectasis overlies both pleural effusions. Scattered areas of patchy ground glass attenuation and subsegmental atelectasis in the mid and  upper lung zones. No consolidative change or signs of pneumothorax. No pulmonary edema. UPPER ABDOMEN: Limited images of the upper abdomen are unremarkable. SOFT TISSUES AND BONES: Mild multilevel endplate degenerative changes in the thoracic spine. No acute soft tissue abnormality. IMPRESSION: 1. No pulmonary embolism. 2. Small bilateral pleural effusions with associated overlying compressive-type atelectasis. 3. Possible mild interstitial edema. Correlate for any clinical signs or symptoms of congestive heart failure. 4. Aortic atherosclerosis and coronary artery calcifications. Electronically signed by: Waddell Calk MD 07/17/2024 06:46 AM EST RP Workstation: [id number]   DG Chest 2 View Result Date: 07/17/2024 EXAM: 2 VIEW(S) XRAY OF THE CHEST 07/17/2024 04:03:00 AM COMPARISON: 04/18/2024 CLINICAL HISTORY: shortness of breath FINDINGS: LINES, TUBES AND DEVICES: Loop recorder device overlies left chest. LUNGS AND PLEURA: Small bilateral pleural effusions. Mild perihilar pulmonary edema. No focal pulmonary opacity. No pneumothorax. HEART AND MEDIASTINUM: Aortic atherosclerosis. Together, the findings  keeping with mild cardiogenic failure. BONES AND SOFT TISSUES: No acute osseous abnormality. IMPRESSION: 1. Small bilateral pleural effusions and mild perihilar pulmonary edema, consistent with mild cardiogenic failure. Electronically signed by: Dorethia Molt MD 07/17/2024 04:15 AM EST RP Workstation: [id number]    EKG: Independently reviewed.  NSR with rate 72; nonspecific ST changes with no evidence of acute ischemia   Labs on Admission: I have personally reviewed the available labs and imaging studies at the time of the admission.  Pertinent labs:   wbc: 18.8,  pro-bnp: 814 d-dimer: .68,  resp panel negative   Assessment and Plan: Principal Problem:   Acute respiratory failure with hypoxia secondary to acute on chronic diastolic CHF exacerbation Active Problems:   Leukocytosis   Hypertension   Paroxysmal atrial fibrillation (HCC)   Type 2 diabetes mellitus (HCC)   Hypothyroidism (acquired)   History of stroke   Hyperlipidemia   Mixed incontinence   Acute on chronic diastolic CHF (congestive heart failure) (HCC)    Assessment and Plan: * Acute respiratory failure with hypoxia secondary to acute on chronic diastolic CHF exacerbation 89 year old presenting to ED with acute onset of shortness of breath with hypoxia to 80% on room air requiring cpap found to be in acute on chronic diastolic CHF exacerbation due to hypoxia, shortness of breath, elevated pro-bnp and findings on imaging of bilateral pleural effusion and pulmonary edema  -admit to progressive -output of 700+cc of urine s/p lasix  and now on 2-3L [place] oxygen  -continue lasix . Given 40mg  in ED, continue this daily as lasix  naive -strict I/O and daily weights -check echo (last echo 05/2023 with EF of 60-65% and grade 1 DD. Moderately dilated left atrium)  -CTA chest negative for PE (also on eliquis , no doses missed)  -check troponin  -check RVP considering possibly cold and sick contacts at home.  Covid/flu and RSV  negative  -has ILR, but in NSR and no reports of palpitations/etc.  -continue lopressor  50mg  BID  -adjust GDMT once echo results   Leukocytosis Likely steroid induced as received IV steroids with EMS UA pending, CXR with no pneumonia  Continue to trend   Paroxysmal atrial fibrillation (HCC) In NSR, no reports of palpitations Continue lopressor  and eliquis   On telemetry and has ILR placed in 05/2024 following CVA   Hypertension Continue lopressor  50mg  BID  Hold norvasc , blood pressure soft Consider ARB/ACE-I instead for GDMT if needed for more HTN control   Type 2 diabetes mellitus (HCC) A1C of 6.7 in April 2025 Sensitive SSI and accuchecks QAC/HS   Hypothyroidism (acquired) No recent thyroid  labs  in last 1+ year Check TSH and free T4 Continue home synthroid  dose, 50mcg daily   History of stroke Continue statin and eliquis  She had ILR placed in 05/2024   Mixed incontinence S/p cystoscopy and botox injection on 07/06/24   Hyperlipidemia Continue lipitor 20mg  daily     Advance Care Planning:   Code Status: Full Code    Consultants: None    Procedures: Echo ordered 12/6    Antibiotics: None     DVT Prophylaxis: eliquis    Family Communication: son at bedside   Severity of Illness: The appropriate patient status for this patient is INPATIENT. Inpatient status is judged to be reasonable and necessary in order to provide the required intensity of service to ensure the patient's safety. The patient's presenting symptoms, physical exam findings, and initial radiographic and laboratory data in the context of their chronic comorbidities is felt to place them at high risk for further clinical deterioration. Furthermore, it is not anticipated that the patient will be medically stable for discharge from the hospital within 2 midnights of admission.   * I certify that at the point of admission it is my clinical judgment that the patient will require inpatient hospital  care spanning beyond 2 midnights from the point of admission due to high intensity of service, high risk for further deterioration and high frequency of surveillance required.*  Author: Isaiah Geralds, MD 07/17/2024 9:21 AM  For on call review [email or web address].

## 2024-07-17 NOTE — ED Triage Notes (Addendum)
 Patient is from home, lives with her son. Been feeling unwell for the last few days. Reports dark mucous with some cough. Patient was asleep and woke up extremely short of breath. EMS reports diminished lungs sound in the lower fields, bilaterally.  Patient was 80% on scene. Placed on a NRB. Patients sats increased to mid 90s. Patient still did not feel any relief and was placed on the CPAP. EMS gave nebulizer in route.Patient arrives in CPAP, Peep of 5.   Takes eliquis  and metoprolol  for afib EMS gave 2gm Mag, 125ml Solumedrol, 2 duonebs, 20G IV bilaterally

## 2024-07-17 NOTE — ED Provider Notes (Signed)
 [place] [facility] DE[facility]MENT AT [facility] Provider Note  CSN: [id number] Arrival date & time: 07/17/24 0304  Chief Complaint(s) Shortness of Breath  HPI/MDM Leslie Ward is a 89 y.o. female    The history is provided by the patient and the EMS personnel.     Clinical Course as of 07/17/24 0654  Sat Jul 17, 2024  0315 Patient presented for sudden onset shortness of breath while at home.  Found to be satting in the 80s on room air.  Required CPAP, DuoNebs, Solu-Medrol  and magnesium and route.  Patient reported improvement with treatment by EMS. [PC]  0651 CBC with leukocytosis to 18,000.  No anemia.  BMP without significant electrolyte derangements.  Hyperglycemia without DKA.  No renal insufficiency.  EKG without acute ischemic changes, dysrhythmias or blocks.  Patient has stable diffuse ST depression throughout.  proBNP and chest x-ray are consistent with CHF exacerbation.  Is possible that patient had flash pulmonary edema.  Dimer was slightly elevated but below age limit threshold.  CT scan obtained and negative for PE, pneumonia, pneumothorax.  I did confirm the pulmonary edema.  Patient given IV Lasix .  I spoke with Dr. Lorren from the hospitalist service who admitted patient for further workup and management. [PC]    Clinical Course User Index [PC] Alireza Pollack, Raynell Moder, MD    Medical Decision Making Amount and/or Complexity of Data Reviewed Labs: ordered. Decision-making details documented in ED Course. Radiology: ordered and independent interpretation performed. Decision-making details documented in ED Course. ECG/medicine tests: ordered and independent interpretation performed. Decision-making details documented in ED Course.  Risk Prescription drug management. Decision regarding hospitalization.     Final Clinical Impression(s) / ED Diagnoses Final diagnoses:  Acute respiratory distress  Acute on chronic diastolic congestive heart  failure (HCC)   This chart was dictated using voice recognition software.  Despite best efforts to proofread,  errors can occur which can change the documentation meaning.  Past Medical History Past Medical History:  Diagnosis Date   Acute encephalopathy 06/11/2023   Adhesive capsulitis of left shoulder 12/22/2018   Bilateral shoulder pain 08/24/2021   Blepharitis of upper and lower eyelids of both eyes 01/07/2020   BPPV (benign paroxysmal positional vertigo), right 01/03/2021   Cerebrovascular accident (CVA) (HCC) 10/25/2021   Cervical dystonia 03/08/2019   Xeomin  approved diagnosis.   COVID 05/29/2021   Dermatochalasis of both upper eyelids 01/07/2020   Dystonia    Edema 02/11/2019   Gait abnormality 10/13/2023   Ganglion cyst 08/25/2020   Glaucoma    History of stroke 10/2021   Hypercoagulable state due to paroxysmal atrial fibrillation (HCC) 07/18/2023   Hyperlipidemia 02/11/2019   Hypertension    Hypertensive retinopathy of both eyes 01/07/2020   Hypothyroidism (acquired) 01/19/2019   Inflammatory arthritis 08/24/2021   Macular RPE mottling 01/07/2020   Meibomian gland dysfunction (MGD) of both eyes 01/07/2020   Mixed incontinence 03/15/2020   Formatting of this note might be different from the original.  Added automatically from request for surgery [id number]   Neck pain 01/19/2019   Osteoarthritis of right knee 12/18/2022   Paroxysmal atrial fibrillation (HCC) 07/18/2023   Post-COVID chronic fatigue 05/29/2021   Post-COVID chronic joint pain 05/29/2021   Post-COVID chronic muscle pain 05/29/2021   Primary open angle glaucoma (POAG) of both eyes, moderate stage 09/12/2021   Sedimentation rate elevation 08/24/2021   Stroke (HCC) 10/2021   Stroke (HCC) 05/2023   Syncope 10/09/2021   Thyroid  disease    Type 2 diabetes  mellitus (HCC) 01/19/2019   Urgency incontinence 03/15/2020   Formatting of this note might be different from the original.  Added automatically from  request for surgery [id number]   Urinary frequency 08/24/2021   Vertigo 12/01/2019   Vitreous syneresis of both eyes 01/07/2020   Patient Active Problem List   Diagnosis Date Noted   Gait abnormality 10/13/2023   Paroxysmal atrial fibrillation (HCC) 07/18/2023   Hypercoagulable state due to paroxysmal atrial fibrillation (HCC) 07/18/2023   Acute encephalopathy 06/11/2023   Osteoarthritis of right knee 12/18/2022   Glaucoma 11/26/2021   Cerebrovascular accident (CVA) (HCC) 10/25/2021   History of stroke 10/2021   Stroke (HCC) 10/2021   Syncope 10/09/2021   Primary open angle glaucoma (POAG) of both eyes, moderate stage 09/12/2021   Sedimentation rate elevation 08/24/2021   Bilateral shoulder pain 08/24/2021   Inflammatory arthritis 08/24/2021   Urinary frequency 08/24/2021   COVID 05/29/2021   Post-COVID chronic fatigue 05/29/2021   Post-COVID chronic joint pain 05/29/2021   Post-COVID chronic muscle pain 05/29/2021   BPPV (benign paroxysmal positional vertigo), right 01/03/2021   Thyroid  disease    Ganglion cyst 08/25/2020   Mixed incontinence 03/15/2020   Urgency incontinence 03/15/2020   Blepharitis of upper and lower eyelids of both eyes 01/07/2020   Dermatochalasis of both upper eyelids 01/07/2020   Hypertensive retinopathy of both eyes 01/07/2020   Macular RPE mottling 01/07/2020   Meibomian gland dysfunction (MGD) of both eyes 01/07/2020   Vitreous syneresis of both eyes 01/07/2020   Vertigo 12/01/2019   Cervical dystonia 03/08/2019   Hyperlipidemia 02/11/2019   Edema 02/11/2019   Hypertension 01/19/2019   Type 2 diabetes mellitus (HCC) 01/19/2019   Hypothyroidism (acquired) 01/19/2019   Neck pain 01/19/2019   Dystonia 01/19/2019   Adhesive capsulitis of left shoulder 12/22/2018   Home Medication(s) Prior to Admission medications   Medication Sig Start Date End Date Taking? Authorizing Provider  amLODipine  (NORVASC ) 5 MG tablet 1 tab po bid 07/09/23   Saguier,  [place], PA-C  apixaban  (ELIQUIS ) 2.5 MG TABS tablet TAKE 1 TABLET BY MOUTH 2 TIMES A DAY 09/17/23   Terra Fairy PARAS, PA-C  atorvastatin  (LIPITOR) 20 MG tablet Take 1 tablet (20 mg total) by mouth daily. Patient not taking: Reported on 04/30/2024 07/04/23   Saguier, [place], PA-C  benzonatate  (TESSALON ) 100 MG capsule Take 1 capsule (100 mg total) by mouth 3 (three) times daily as needed for cough. Patient not taking: Reported on 04/30/2024 04/21/24   Saguier, [place], PA-C  Calcium  Carbonate (CALCIUM  600 PO) Take 1 tablet by mouth daily.    [provider]  Cholecalciferol (VITAMIN D) 125 MCG (5000 UT) CAPS Take 5,000 Units by mouth daily.    [provider]  Cinnamon 500 MG capsule Take 1,000 mg by mouth daily.    [provider]  dorzolamide -timolol  (COSOPT ) 2-0.5 % ophthalmic solution Place 1 drop into both eyes 2 (two) times daily. 10/30/22   [provider]  fluconazole  (DIFLUCAN ) 150 MG tablet Take 1 tablet (150 mg total) by mouth daily. 12/12/23   Saguier, [place], PA-C  fluticasone  (FLONASE ) 50 MCG/ACT nasal spray Place 2 sprays into both nostrils daily. 04/30/24   Saguier, [place], PA-C  guaiFENesin -codeine  100-10 MG/5ML syrup Take 5 mLs by mouth every 6 (six) hours as needed for cough. 04/22/24   Saguier, [place], PA-C  hydrocortisone  (ANUSOL -HC) 25 MG suppository Place 1 suppository (25 mg total) rectally 2 (two) times daily. 02/24/24   Saguier, [place], PA-C  latanoprost  (XALATAN ) 0.005 %  ophthalmic solution Place 1 drop into both eyes every evening. 09/12/21   [provider]  levothyroxine  (SYNTHROID ) 50 MCG tablet TAKE 1 TABLET BY MOUTH DAILY BEFORE BREAKFAST 07/13/24   Saguier, [place], PA-C  meclizine  (ANTIVERT ) 25 MG tablet Take 0.5-1 tablets (12.5-25 mg total) by mouth 3 (three) times daily as needed for dizziness. Patient not taking: Reported on 04/30/2024 10/10/23   Harris, Abigail, PA-C  Menaquinone-7 (VITAMIN K2 PO) Take 1 tablet by mouth at bedtime.     [provider]  metoprolol  tartrate (LOPRESSOR ) 50 MG tablet TAKE 1 TABLET BY MOUTH 2 TIMES A DAY 05/21/24   Saguier, [place], PA-C  mupirocin  ointment (BACTROBAN ) 2 % Apply 1 Application topically 2 (two) times daily. 02/24/24   Saguier, [place], PA-C  Phytosterol Esters (CHOLEST CARE PO) Take 1,600 mg by mouth daily.    [provider]  predniSONE  (DELTASONE ) 20 MG tablet Take 2 tabs PO daily x 5 days. Patient not taking: Reported on 04/30/2024 04/18/24   Teddy Sharper, FNP  Probiotic Product (PROBIOTIC DAILY PO) Take 1 tablet by mouth at bedtime.    [provider]  vitamin C (ASCORBIC ACID) 500 MG tablet Take 500 mg by mouth daily.    [provider]                                                                                                                                    Allergies Crestor [rosuvastatin calcium ]  Review of Systems Review of Systems As noted in HPI  Physical Exam Vital Signs  I have reviewed the triage vital signs BP 137/64   Pulse 74   Temp 98 F (36.7 C) (Axillary) Comment: Simultaneous filing. User may not have seen previous data.  Resp (!) 26   SpO2 100%   Physical Exam Vitals reviewed.  Constitutional:      General: She is not in acute distress.    Appearance: She is well-developed. She is not diaphoretic.  HENT:     Head: Normocephalic and atraumatic.     Nose: Nose normal.  Eyes:     General: No scleral icterus.       Right eye: No discharge.        Left eye: No discharge.     Conjunctiva/sclera: Conjunctivae normal.     Pupils: Pupils are equal, round, and reactive to light.  Cardiovascular:     Rate and Rhythm: Normal rate and regular rhythm.     Heart sounds: No murmur heard.    No friction rub. No gallop.  Pulmonary:     Effort: Tachypnea and respiratory distress present.     Breath sounds: No stridor. Examination of the left-upper field reveals rhonchi. Examination of the right-middle field reveals  rhonchi. Rhonchi present. No rales.  Abdominal:     General: There is no distension.     Palpations: Abdomen is soft.     Tenderness: There is  no abdominal tenderness.  Musculoskeletal:        General: No tenderness.     Cervical back: Normal range of motion and neck supple.     Right lower leg: Edema present.     Left lower leg: Edema present.  Skin:    General: Skin is warm and dry.     Findings: No erythema or rash.  Neurological:     Mental Status: She is alert and oriented to person, place, and time.     ED Results and Treatments Labs (all labs ordered are listed, but only abnormal results are displayed) Labs Reviewed  BASIC METABOLIC PANEL WITH GFR - Abnormal; Notable for the following components:      Result Value   Glucose, Bld 168 (*)    All other components within normal limits  CBC - Abnormal; Notable for the following components:   WBC 18.8 (*)    RDW 15.9 (*)    All other components within normal limits  PRO BRAIN NATRIURETIC PEPTIDE - Abnormal; Notable for the following components:   Pro Brain Natriuretic Peptide 814.0 (*)    All other components within normal limits  D-DIMER, QUANTITATIVE - Abnormal; Notable for the following components:   D-Dimer, Quant 0.68 (*)    All other components within normal limits  RESP PANEL BY RT-PCR (RSV, FLU A&B, COVID)  [id number]                                                                                                                         EKG  EKG Interpretation Date/Time:  Saturday July 17 2024 03:13:59 EST Ventricular Rate:  72 PR Interval:    QRS Duration:  96 QT Interval:  407 QTC Calculation: 446 R Axis:   47  Text Interpretation: Sinus rhythm Borderline ST depression, diffuse leads No significant change since last tracing Confirmed by Trine Likes [phone]) on 07/17/2024 5:07:02 AM       Radiology CT Angio Chest PE W and/or Wo Contrast Result Date: 07/17/2024 EXAM: CTA of the Chest with contrast for  PE 07/17/2024 06:40:47 AM TECHNIQUE: CTA of the chest was performed after the administration of 60 mL of iohexol  (OMNIPAQUE ) 350 MG/ML injection. Multiplanar reformatted images are provided for review. MIP images are provided for review. Automated exposure control, iterative reconstruction, and/or weight based adjustment of the mA/kV was utilized to reduce the radiation dose to as low as reasonably achievable. COMPARISON: 03/21/2024 CLINICAL HISTORY: Pulmonary embolism (PE) suspected, low to intermediate prob, positive D-dimer. FINDINGS: PULMONARY ARTERIES: Pulmonary arteries are adequately opacified for evaluation. No pulmonary embolism. Main pulmonary artery is normal in caliber. MEDIASTINUM: Atherosclerosis and multivessel coronary artery calcifications. Aberrant right subclavian artery courses posterior to the esophagus. There is no acute abnormality of the thoracic aorta. LYMPH NODES: No mediastinal, hilar or axillary lymphadenopathy. LUNGS AND PLEURA: Small bilateral pleural effusions. Mild increased interlobular septal thickening in the lung bases. Compressive-type atelectasis overlies both pleural effusions. Scattered areas of patchy ground glass  attenuation and subsegmental atelectasis in the mid and upper lung zones. No consolidative change or signs of pneumothorax. No pulmonary edema. UPPER ABDOMEN: Limited images of the upper abdomen are unremarkable. SOFT TISSUES AND BONES: Mild multilevel endplate degenerative changes in the thoracic spine. No acute soft tissue abnormality. IMPRESSION: 1. No pulmonary embolism. 2. Small bilateral pleural effusions with associated overlying compressive-type atelectasis. 3. Possible mild interstitial edema. Correlate for any clinical signs or symptoms of congestive heart failure. 4. Aortic atherosclerosis and coronary artery calcifications. Electronically signed by: Waddell Calk MD 07/17/2024 06:46 AM EST RP Workstation: [id number]   DG Chest 2 View Result Date:  07/17/2024 EXAM: 2 VIEW(S) XRAY OF THE CHEST 07/17/2024 04:03:00 AM COMPARISON: 04/18/2024 CLINICAL HISTORY: shortness of breath FINDINGS: LINES, TUBES AND DEVICES: Loop recorder device overlies left chest. LUNGS AND PLEURA: Small bilateral pleural effusions. Mild perihilar pulmonary edema. No focal pulmonary opacity. No pneumothorax. HEART AND MEDIASTINUM: Aortic atherosclerosis. Together, the findings keeping with mild cardiogenic failure. BONES AND SOFT TISSUES: No acute osseous abnormality. IMPRESSION: 1. Small bilateral pleural effusions and mild perihilar pulmonary edema, consistent with mild cardiogenic failure. Electronically signed by: Dorethia Molt MD 07/17/2024 04:15 AM EST RP Workstation: [id number]    Medications Ordered in ED Medications  furosemide  (LASIX ) injection 40 mg (40 mg Intravenous Given 07/17/24 0521)  iohexol  (OMNIPAQUE ) 350 MG/ML injection 75 mL (60 mLs Intravenous Contrast Given 07/17/24 0621)   Procedures .Critical Care  Performed by: Trine Raynell Moder, MD Authorized by: Trine Raynell Moder, MD   Critical care provider statement:    Critical care time (minutes):  45   Critical care time was exclusive of:  Separately billable procedures and treating other patients   Critical care was necessary to treat or prevent imminent or life-threatening deterioration of the following conditions:  Respiratory failure   Critical care was time spent personally by me on the following activities:  Development of treatment plan with patient or surrogate, discussions with consultants, evaluation of patient's response to treatment, examination of patient, obtaining history from patient or surrogate, review of old charts, re-evaluation of patient's condition, pulse oximetry, ordering and review of radiographic studies, ordering and review of laboratory studies and ordering and performing treatments and interventions   Care discussed with: admitting provider     (including critical  care time)   This chart was dictated using voice recognition software.  Despite best efforts to proofread,  errors can occur which can change the documentation meaning.   Trine Raynell Moder, MD 07/17/24 [phone]

## 2024-07-17 NOTE — Assessment & Plan Note (Signed)
Continue lipitor 20mg daily  

## 2024-07-17 NOTE — Assessment & Plan Note (Signed)
 Continue lopressor  50mg  BID  Hold norvasc , blood pressure soft Consider ARB/ACE-I instead for GDMT if needed for more HTN control

## 2024-07-18 ENCOUNTER — Other Ambulatory Visit: Payer: Self-pay | Admitting: Medical

## 2024-07-18 DIAGNOSIS — J9601 Acute respiratory failure with hypoxia: Secondary | ICD-10-CM

## 2024-07-18 LAB — CBC
HCT: 37.9 % (ref 36.0–46.0)
Hemoglobin: 12.2 g/dL (ref 12.0–15.0)
MCH: 28.4 pg (ref 26.0–34.0)
MCHC: 32.2 g/dL (ref 30.0–36.0)
MCV: 88.1 fL (ref 80.0–100.0)
Platelets: 295 K/uL (ref 150–400)
RBC: 4.3 MIL/uL (ref 3.87–5.11)
RDW: 16.1 % — ABNORMAL HIGH (ref 11.5–15.5)
WBC: 12.5 K/uL — ABNORMAL HIGH (ref 4.0–10.5)
nRBC: 0 % (ref 0.0–0.2)

## 2024-07-18 LAB — COMPREHENSIVE METABOLIC PANEL WITH GFR
ALT: 20 U/L (ref 0–44)
AST: 23 U/L (ref 15–41)
Albumin: 3.8 g/dL (ref 3.5–5.0)
Alkaline Phosphatase: 81 U/L (ref 38–126)
Anion gap: 12 (ref 5–15)
BUN: 22 mg/dL (ref 8–23)
CO2: 23 mmol/L (ref 22–32)
Calcium: 9.4 mg/dL (ref 8.9–10.3)
Chloride: 105 mmol/L (ref 98–111)
Creatinine, Ser: 0.69 mg/dL (ref 0.44–1.00)
GFR, Estimated: >60 mL/min (ref >60)
Glucose, Bld: 137 mg/dL — ABNORMAL HIGH (ref 70–99)
Potassium: 3.4 mmol/L — ABNORMAL LOW (ref 3.5–5.1)
Sodium: 140 mmol/L (ref 135–145)
Total Bilirubin: 0.9 mg/dL (ref 0.0–1.2)
Total Protein: 7.2 g/dL (ref 6.5–8.1)

## 2024-07-18 LAB — GLUCOSE, CAPILLARY
Glucose-Capillary: 123 mg/dL — ABNORMAL HIGH (ref 70–99)
Glucose-Capillary: 121 mg/dL — ABNORMAL HIGH (ref 70–99)
Glucose-Capillary: 124 mg/dL — ABNORMAL HIGH (ref 70–99)
Glucose-Capillary: 146 mg/dL — ABNORMAL HIGH (ref 70–99)

## 2024-07-18 MED ORDER — GUAIFENESIN ER 600 MG PO TB12
600.0000 mg | ORAL_TABLET | Freq: Two times a day (BID) | ORAL | Status: DC
  Administered 2024-07-18 – 2024-07-20 (×5): 600 mg via ORAL
  Filled 2024-07-18 (×5): qty 1

## 2024-07-18 MED ORDER — POTASSIUM CHLORIDE CRYS ER 20 MEQ PO TBCR
40.0000 meq | EXTENDED_RELEASE_TABLET | Freq: Once | ORAL | Status: AC
  Administered 2024-07-18: 40 meq via ORAL
  Filled 2024-07-18: qty 2

## 2024-07-18 MED ORDER — PNEUMOCOCCAL 20-VAL CONJ VACC 0.5 ML IM SUSY
0.5000 mL | PREFILLED_SYRINGE | INTRAMUSCULAR | Status: AC
  Administered 2024-07-19: 0.5 mL via INTRAMUSCULAR
  Filled 2024-07-18: qty 0.5

## 2024-07-18 NOTE — Progress Notes (Signed)
 Mobility Specialist - Progress Note  (RA) Pre-mobility: 70 bpm HR, 95% SpO2 During mobility: 93% SpO2 Post-mobility: 71 bpm HR, 98% SPO2   07/18/24 1130  Mobility  Activity Ambulated with assistance  Level of Assistance Contact guard assist, steadying assist  Assistive Device Front wheel walker  Distance Ambulated (ft) 300 ft  Range of Motion/Exercises Active  Activity Response Tolerated well  Mobility visit 1 Mobility  Mobility Specialist Start Time (ACUTE ONLY) 1130  Mobility Specialist Stop Time (ACUTE ONLY) 1144  Mobility Specialist Time Calculation (min) (ACUTE ONLY) 14 min   Pt was found in bed and agreeable to mobilize. RN placed pt on RA prior to hallway ambulation. Encouraged pursed lip breathing. At EOS returned to recliner chair with all needs met. Call bell in reach and chair alarm on. RN notified.   Erminio Leos,  Mobility Specialist Can be reached via Secure Chat

## 2024-07-18 NOTE — Progress Notes (Signed)
 PROGRESS NOTE  Leslie Ward  FMW:[id number] DOB: 01/16/1931 DOA: 07/17/2024 PCP: Dorina Loving, PA-C   Brief Narrative: Patient is a 89 year old female with history of CVA, paroxysmal A-fib, diabetes mellitus, hypertension, hyperlipidemia, hypothyroidism who presented with shortness of breath from home.  She is intermittent at baseline, uses walker to ambulate.  On presentation, she was hypoxic and requiring supplemental  oxygen.  Lab work showed WC count of 18.8, elevated proBNP of 814.  Chest x-ray showed small bilateral pleural effusion, mild perihilar pulmonary edema.  CTA chest did not show any PE but showed small bilateral pleural effusion, mild interstitial edema.  Patient was admitted for the management of acute on chronic HFpEF, started on IV Lasix .  Assessment & Plan:  Principal Problem:   Acute respiratory failure with hypoxia secondary to acute on chronic diastolic CHF exacerbation Active Problems:   Leukocytosis   Hypertension   Paroxysmal atrial fibrillation (HCC)   Type 2 diabetes mellitus (HCC)   Hypothyroidism (acquired)   History of stroke   Hyperlipidemia   Mixed incontinence   Acute on chronic diastolic CHF (congestive heart failure) (HCC)   Acute hypoxic respiratory failure secondary to CHF exacerbation: Hypoxic on presentation.  Secondary to CHF exacerbation.  Not on oxygen at home.  Will try to wean the oxygen.  Respiratory viral panel negative.  Acute on chronic HFpEF: Presented with shortness of breath, elevated BNP.  Chest imaging suggestive of volume overload/pulm edema.  Started on IV Lasix .  Continue to monitor input/output, daily weight.  Last echo on 05/2001 showed EF of 60 to 65%, grade 1 diastolic dysfunction.  New echo showed EF of 65 to 70%, grade 1 diastolic dysfunction, normal right ventricular systolic function.  Hypertension: Takes blood pressure at home.  Continued  Paroxysmal A-fib: Currently in normal sinus rhythm.  On metoprolol  and  Eliquis  for anticoagulation.  Monitor on telemetry.  Leukocytosis: Unclear etiology.  She received IV steroids given by EMS.  No signs of infection.  Continue monitoring.  Improving  Type 2 diabetes: Recent A1c was 6.7.  Continue sliding scale.  Monitor blood sugars  Hypothyroidism: Continue home Synthyroid  History of stroke/hyperlipidemia: On statin, Eliquis .  No focal deficits.  Hypokalemia: Supplemented with potassium  Debility/deconditioning: Patient is independent at baseline.  Lives alone in an apartment.  Ambulates with the help of walker.  Will consult PT         DVT prophylaxis:apixaban  (ELIQUIS ) tablet 2.5 mg Start: 07/17/24 1030 apixaban  (ELIQUIS ) tablet 2.5 mg     Code Status: Full Code  Family Communication: Called and discussed with son Jos on phone on 12/7  Patient status:Inpatient  Patient is from :Home  Anticipated discharge un:Ynfz  Estimated DC date:1-2 days   Consultants: None  Procedures:None  Antimicrobials:  Anti-infectives (From admission, onward)    None       Subjective: Patient seen and examined at bedside today.  Overall comfortable.  On 2 L of oxygen.  Does not appear to be in respite distress.  Speaking in full sentences.  Has some cough.  Fine bibasilar crackles auscultated.  Objective: Vitals:   07/17/24 2029 07/18/24 0015 07/18/24 0500 07/18/24 0513  BP: 139/67 (!) 119/46  (!) 150/64  Pulse: 84 85  72  Resp: 17 18  18   Temp: 98.2 F (36.8 C) 97.7 F (36.5 C)  97.7 F (36.5 C)  TempSrc: Oral Oral  Oral  SpO2: 98% 94%  97%  Weight:   54.6 kg   Height:  Intake/Output Summary (Last 24 hours) at 07/18/2024 0838 Last data filed at 07/18/2024 0500 Gross per 24 hour  Intake 480 ml  Output 1000 ml  Net -520 ml   Filed Weights   07/17/24 1235 07/18/24 0500  Weight: 55.8 kg 54.6 kg    Examination:  General exam: Overall comfortable, not in distress HEENT: PERRL Respiratory system: Bilateral basal  crackles Cardiovascular system: S1 & S2 heard, RRR.  Gastrointestinal system: Abdomen is nondistended, soft and nontender. Central nervous system: Alert and oriented Extremities: No edema, no clubbing ,no cyanosis Skin: No rashes, no ulcers,no icterus     Data Reviewed: I have personally reviewed following labs and imaging studies  CBC: Recent Labs  Lab 07/17/24 0319 07/18/24 0521  WBC 18.8* 12.5*  HGB 12.5 12.2  HCT 38.9 37.9  MCV 88.6 88.1  PLT 329 295   Basic Metabolic Panel: Recent Labs  Lab 07/17/24 0319 07/17/24 0953 07/18/24 0521  NA 139  --  140  K 3.8  --  3.4*  CL 106  --  105  CO2 22  --  23  GLUCOSE 168*  --  137*  BUN 17  --  22  CREATININE 0.68  --  0.69  CALCIUM  9.5  --  9.4  MG  --  2.6*  --      Recent Results (from the past 240 hours)  Resp panel by RT-PCR (RSV, Flu A&B, Covid) Anterior Nasal Swab     Status: None   Collection Time: 07/17/24  7:03 AM   Specimen: Anterior Nasal Swab  Result Value Ref Range Status   SARS Coronavirus 2 by RT PCR NEGATIVE NEGATIVE Final    Comment: (NOTE) SARS-CoV-2 target nucleic acids are NOT DETECTED.  The SARS-CoV-2 RNA is generally detectable in upper respiratory specimens during the acute phase of infection. The lowest concentration of SARS-CoV-2 viral copies this assay can detect is 138 copies/mL. A negative result does not preclude SARS-Cov-2 infection and should not be used as the sole basis for treatment or other patient management decisions. A negative result may occur with  improper specimen collection/handling, submission of specimen other than nasopharyngeal swab, presence of viral mutation(s) within the areas targeted by this assay, and inadequate number of viral copies(<138 copies/mL). A negative result must be combined with clinical observations, patient history, and epidemiological information. The expected result is Negative.  Fact Sheet for Patients:   bloggercourse.com  Fact Sheet for Healthcare Providers:  seriousbroker.it  This test is no t yet approved or cleared by the [place]  FDA and  has been authorized for detection and/or diagnosis of SARS-CoV-2 by FDA under an Emergency Use Authorization (EUA). This EUA will remain  in effect (meaning this test can be used) for the duration of the COVID-19 declaration under Section 564(b)(1) of the Act, 21 U.S.C.section 360bbb-3(b)(1), unless the authorization is terminated  or revoked sooner.       Influenza A by PCR NEGATIVE NEGATIVE Final   Influenza B by PCR NEGATIVE NEGATIVE Final    Comment: (NOTE) The Xpert Xpress SARS-CoV-2/FLU/RSV plus assay is intended as an aid in the diagnosis of influenza from Nasopharyngeal swab specimens and should not be used as a sole basis for treatment. Nasal washings and aspirates are unacceptable for Xpert Xpress SARS-CoV-2/FLU/RSV testing.  Fact Sheet for Patients: bloggercourse.com  Fact Sheet for Healthcare Providers: seriousbroker.it  This test is not yet approved or cleared by the [place]  FDA and has been authorized for detection and/or diagnosis of  SARS-CoV-2 by FDA under an Emergency Use Authorization (EUA). This EUA will remain in effect (meaning this test can be used) for the duration of the COVID-19 declaration under Section 564(b)(1) of the Act, 21 U.S.C. section 360bbb-3(b)(1), unless the authorization is terminated or revoked.     Resp Syncytial Virus by PCR NEGATIVE NEGATIVE Final    Comment: (NOTE) Fact Sheet for Patients: bloggercourse.com  Fact Sheet for Healthcare Providers: seriousbroker.it  This test is not yet approved or cleared by the [place]  FDA and has been authorized for detection and/or diagnosis of SARS-CoV-2 by FDA under an Emergency Use  Authorization (EUA). This EUA will remain in effect (meaning this test can be used) for the duration of the COVID-19 declaration under Section 564(b)(1) of the Act, 21 U.S.C. section 360bbb-3(b)(1), unless the authorization is terminated or revoked.  Performed at [facility], [address], [place], [place] [address]   Respiratory (~20 pathogens) panel by PCR     Status: None   Collection Time: 07/17/24  7:03 AM   Specimen: Nasopharyngeal Swab; Respiratory  Result Value Ref Range Status   Adenovirus NOT DETECTED NOT DETECTED Final   Coronavirus 229E NOT DETECTED NOT DETECTED Final    Comment: (NOTE) The Coronavirus on the Respiratory Panel, DOES NOT test for the novel  Coronavirus (2019 nCoV)    Coronavirus HKU1 NOT DETECTED NOT DETECTED Final   Coronavirus NL63 NOT DETECTED NOT DETECTED Final   Coronavirus OC43 NOT DETECTED NOT DETECTED Final   Metapneumovirus NOT DETECTED NOT DETECTED Final   Rhinovirus / Enterovirus NOT DETECTED NOT DETECTED Final   Influenza A NOT DETECTED NOT DETECTED Final   Influenza B NOT DETECTED NOT DETECTED Final   Parainfluenza Virus 1 NOT DETECTED NOT DETECTED Final   Parainfluenza Virus 2 NOT DETECTED NOT DETECTED Final   Parainfluenza Virus 3 NOT DETECTED NOT DETECTED Final   Parainfluenza Virus 4 NOT DETECTED NOT DETECTED Final   Respiratory Syncytial Virus NOT DETECTED NOT DETECTED Final   Bordetella pertussis NOT DETECTED NOT DETECTED Final   Bordetella Parapertussis NOT DETECTED NOT DETECTED Final   Chlamydophila pneumoniae NOT DETECTED NOT DETECTED Final   Mycoplasma pneumoniae NOT DETECTED NOT DETECTED Final    Comment: Performed at [facility], [address], [place], [place] [address]     Radiology Studies: ECHOCARDIOGRAM COMPLETE Result Date: 07/17/2024    ECHOCARDIOGRAM REPORT   Patient Name:   Leslie Ward Date of Exam: 07/17/2024 Medical Rec #:  [id number]        Height:       57.0 in Accession #:     [id number]       Weight:       123.0 lb Date of Birth:  1930-08-20        BSA:          1.463 m Patient Age:    89 years         BP:           124/57 mmHg Patient Gender: F                HR:           82 bpm. Exam Location:  Inpatient Procedure: 2D Echo, Color Doppler and Cardiac Doppler (Both Spectral and Color            Flow Doppler were utilized during procedure). Indications:     CHF I50.21  History:  Patient has prior history of Echocardiogram examinations, most                  recent 06/12/2023. Stroke; Risk Factors:Hypertension,                  Dyslipidemia and Diabetes.  Sonographer:     Tinnie Gosling RDCS Referring Phys:  [id number] ISAIAH GERALDS Diagnosing Phys: Wilbert Bihari MD IMPRESSIONS  1. There is a mid LV cavitary systolic gradient due to hyperdynamica LVF.[name] Left ventricular ejection fraction, by estimation, is 65 to 70%. The left ventricle has normal function. The left ventricle has no regional wall motion abnormalities. There is mild left ventricular hypertrophy of the basal-septal segment. Left ventricular diastolic parameters are consistent with Grade I diastolic dysfunction (impaired relaxation). Elevated left atrial pressure.  2. Right ventricular systolic function is normal. The right ventricular size is normal. Tricuspid regurgitation signal is inadequate for assessing PA pressure.  3. The mitral valve is degenerative. Mild mitral valve regurgitation. No evidence of mitral stenosis. Moderate mitral annular calcification.  4. The aortic valve is tricuspid. Aortic valve regurgitation is not visualized. Aortic valve sclerosis/calcification is present, without any evidence of aortic stenosis.  5. The inferior vena cava is normal in size with greater than 50% respiratory variability, suggesting right atrial pressure of 3 mmHg. FINDINGS  Left Ventricle: There is a mid LV cavitary systolic gradient due to hyperdynamica LVF. Left ventricular ejection fraction, by estimation, is 65 to 70%. The  left ventricle has normal function. The left ventricle has no regional wall motion abnormalities.  The left ventricular internal cavity size was normal in size. There is mild left ventricular hypertrophy of the basal-septal segment. Left ventricular diastolic parameters are consistent with Grade I diastolic dysfunction (impaired relaxation). Elevated  left atrial pressure. Right Ventricle: The right ventricular size is normal. No increase in right ventricular wall thickness. Right ventricular systolic function is normal. Tricuspid regurgitation signal is inadequate for assessing PA pressure. Left Atrium: Left atrial size was normal in size. Right Atrium: Right atrial size was normal in size. Pericardium: There is no evidence of pericardial effusion. Mitral Valve: The mitral valve is degenerative in appearance. Moderate mitral annular calcification. Mild mitral valve regurgitation. No evidence of mitral valve stenosis. Tricuspid Valve: The tricuspid valve is normal in structure. Tricuspid valve regurgitation is not demonstrated. No evidence of tricuspid stenosis. Aortic Valve: The aortic valve is tricuspid. Aortic valve regurgitation is not visualized. Aortic valve sclerosis/calcification is present, without any evidence of aortic stenosis. Pulmonic Valve: The pulmonic valve was normal in structure. Pulmonic valve regurgitation is trivial. No evidence of pulmonic stenosis. Aorta: The aortic root is normal in size and structure. Venous: The inferior vena cava is normal in size with greater than 50% respiratory variability, suggesting right atrial pressure of 3 mmHg. IAS/Shunts: No atrial level shunt detected by color flow Doppler.  LEFT VENTRICLE PLAX 2D LVIDd:         3.80 cm   Diastology LVIDs:         2.20 cm   LV e' medial:    6.64 cm/s LV PW:         1.00 cm   LV E/e' medial:  14.7 LV IVS:        1.25 cm   LV e' lateral:   5.87 cm/s LVOT diam:     1.60 cm   LV E/e' lateral: 16.6 LV SV:         52 LV SV Index:  35 LVOT Area:     2.01 cm LV IVRT:       92 msec  RIGHT VENTRICLE             IVC RV S prime:     16.30 cm/s  IVC diam: 0.90 cm TAPSE (M-mode): 1.9 cm LEFT ATRIUM             Index LA diam:        3.30 cm 2.26 cm/m LA Vol (A2C):   36.2 ml 24.74 ml/m LA Vol (A4C):   33.0 ml 22.56 ml/m LA Biplane Vol: 35.2 ml 24.06 ml/m  AORTIC VALVE LVOT Vmax:   126.00 cm/s LVOT Vmean:  82.000 cm/s LVOT VTI:    0.257 m  AORTA Ao Root diam: 2.60 cm Ao Asc diam:  2.80 cm MITRAL VALVE MV Area (PHT): 3.28 cm     SHUNTS MV E velocity: 97.50 cm/s   Systemic VTI:  0.26 m MV A velocity: 132.00 cm/s  Systemic Diam: 1.60 cm MV E/A ratio:  0.74 Wilbert Bihari MD Electronically signed by Wilbert Bihari MD Signature Date/Time: 07/17/2024/4:30:05 PM    Final (Updated)    CT Angio Chest PE W and/or Wo Contrast Result Date: 07/17/2024 EXAM: CTA of the Chest with contrast for PE 07/17/2024 06:40:47 AM TECHNIQUE: CTA of the chest was performed after the administration of 60 mL of iohexol  (OMNIPAQUE ) 350 MG/ML injection. Multiplanar reformatted images are provided for review. MIP images are provided for review. Automated exposure control, iterative reconstruction, and/or weight based adjustment of the mA/kV was utilized to reduce the radiation dose to as low as reasonably achievable. COMPARISON: 03/21/2024 CLINICAL HISTORY: Pulmonary embolism (PE) suspected, low to intermediate prob, positive D-dimer. FINDINGS: PULMONARY ARTERIES: Pulmonary arteries are adequately opacified for evaluation. No pulmonary embolism. Main pulmonary artery is normal in caliber. MEDIASTINUM: Atherosclerosis and multivessel coronary artery calcifications. Aberrant right subclavian artery courses posterior to the esophagus. There is no acute abnormality of the thoracic aorta. LYMPH NODES: No mediastinal, hilar or axillary lymphadenopathy. LUNGS AND PLEURA: Small bilateral pleural effusions. Mild increased interlobular septal thickening in the lung bases. Compressive-type  atelectasis overlies both pleural effusions. Scattered areas of patchy ground glass attenuation and subsegmental atelectasis in the mid and upper lung zones. No consolidative change or signs of pneumothorax. No pulmonary edema. UPPER ABDOMEN: Limited images of the upper abdomen are unremarkable. SOFT TISSUES AND BONES: Mild multilevel endplate degenerative changes in the thoracic spine. No acute soft tissue abnormality. IMPRESSION: 1. No pulmonary embolism. 2. Small bilateral pleural effusions with associated overlying compressive-type atelectasis. 3. Possible mild interstitial edema. Correlate for any clinical signs or symptoms of congestive heart failure. 4. Aortic atherosclerosis and coronary artery calcifications. Electronically signed by: Waddell Calk MD 07/17/2024 06:46 AM EST RP Workstation: [id number]   DG Chest 2 View Result Date: 07/17/2024 EXAM: 2 VIEW(S) XRAY OF THE CHEST 07/17/2024 04:03:00 AM COMPARISON: 04/18/2024 CLINICAL HISTORY: shortness of breath FINDINGS: LINES, TUBES AND DEVICES: Loop recorder device overlies left chest. LUNGS AND PLEURA: Small bilateral pleural effusions. Mild perihilar pulmonary edema. No focal pulmonary opacity. No pneumothorax. HEART AND MEDIASTINUM: Aortic atherosclerosis. Together, the findings keeping with mild cardiogenic failure. BONES AND SOFT TISSUES: No acute osseous abnormality. IMPRESSION: 1. Small bilateral pleural effusions and mild perihilar pulmonary edema, consistent with mild cardiogenic failure. Electronically signed by: Dorethia Molt MD 07/17/2024 04:15 AM EST RP Workstation: [id number]    Scheduled Meds:  amLODipine   5 mg Oral BID   apixaban   2.5 mg Oral BID  artificial tears  1 drop Both Eyes BID   dorzolamide   1 drop Both Eyes BID   And   timolol   1 drop Both Eyes BID   furosemide   40 mg Intravenous Daily   Influenza vac split trivalent PF  0.5 mL Intramuscular Tomorrow-1000   insulin  aspart  0-9 Units Subcutaneous TID WC   latanoprost    1 drop Both Eyes QPM   levothyroxine   50 mcg Oral QAC breakfast   metoprolol  tartrate  50 mg Oral BID   potassium chloride   40 mEq Oral Once   Continuous Infusions:   LOS: 1 day   Ivonne Mustache, MD [facility] P12/02/2024, 8:38 AM

## 2024-07-18 NOTE — Plan of Care (Signed)

## 2024-07-18 NOTE — Plan of Care (Incomplete)
  Problem: Education: Goal: Ability to describe self-care measures that may prevent or decrease complications (Diabetes Survival Skills Education) will improve Outcome: Progressing   Problem: Coping: Goal: Ability to adjust to condition or change in health will improve Outcome: Progressing   Problem: Fluid Volume: Goal: Ability to maintain a balanced intake and output will improve Outcome: Progressing   Problem: Metabolic: Goal: Ability to maintain appropriate glucose levels will improve Outcome: Progressing   Problem: Tissue Perfusion: Goal: Adequacy of tissue perfusion will improve Outcome: Progressing   Problem: Education: Goal: Knowledge of General Education information will improve Description: Including pain rating scale, medication(s)/side effects and non-pharmacologic comfort measures Outcome: Progressing   Problem: Activity: Goal: Risk for activity intolerance will decrease Outcome: Progressing   Problem: Nutritional: Goal: Maintenance of adequate nutrition will improve Outcome: Adequate for Discharge   Problem: Skin Integrity: Goal: Risk for impaired skin integrity will decrease Outcome: Adequate for Discharge   Problem: Coping: Goal: Level of anxiety will decrease Outcome: Adequate for Discharge   Problem: Elimination: Goal: Will not experience complications related to bowel motility Outcome: Adequate for Discharge Goal: Will not experience complications related to urinary retention Outcome: Adequate for Discharge   Problem: Pain Managment: Goal: General experience of comfort will improve and/or be controlled Outcome: Adequate for Discharge   Problem: Safety: Goal: Ability to remain free from injury will improve Outcome: Adequate for Discharge

## 2024-07-19 ENCOUNTER — Ambulatory Visit: Admitting: Medical

## 2024-07-19 DIAGNOSIS — J9601 Acute respiratory failure with hypoxia: Secondary | ICD-10-CM | POA: Diagnosis not present

## 2024-07-19 LAB — BASIC METABOLIC PANEL WITH GFR
Anion gap: 11 (ref 5–15)
BUN: 37 mg/dL — ABNORMAL HIGH (ref 8–23)
CO2: 22 mmol/L (ref 22–32)
Calcium: 9.2 mg/dL (ref 8.9–10.3)
Chloride: 107 mmol/L (ref 98–111)
Creatinine, Ser: 0.91 mg/dL (ref 0.44–1.00)
GFR, Estimated: 60 mL/min — ABNORMAL LOW (ref >60)
Glucose, Bld: 105 mg/dL — ABNORMAL HIGH (ref 70–99)
Potassium: 4 mmol/L (ref 3.5–5.1)
Sodium: 139 mmol/L (ref 135–145)

## 2024-07-19 LAB — GLUCOSE, CAPILLARY
Glucose-Capillary: 107 mg/dL — ABNORMAL HIGH (ref 70–99)
Glucose-Capillary: 148 mg/dL — ABNORMAL HIGH (ref 70–99)
Glucose-Capillary: 120 mg/dL — ABNORMAL HIGH (ref 70–99)
Glucose-Capillary: 156 mg/dL — ABNORMAL HIGH (ref 70–99)

## 2024-07-19 MED ORDER — FUROSEMIDE 20 MG PO TABS
20.0000 mg | ORAL_TABLET | Freq: Every day | ORAL | Status: DC
  Filled 2024-07-19: qty 1

## 2024-07-19 MED ORDER — FUROSEMIDE 10 MG/ML IJ SOLN
40.0000 mg | Freq: Once | INTRAMUSCULAR | Status: AC
  Administered 2024-07-19: 40 mg via INTRAVENOUS
  Filled 2024-07-19: qty 4

## 2024-07-19 MED ORDER — FUROSEMIDE 20 MG PO TABS
20.0000 mg | ORAL_TABLET | Freq: Every day | ORAL | Status: DC
  Administered 2024-07-20: 20 mg via ORAL
  Filled 2024-07-19: qty 1

## 2024-07-19 NOTE — Evaluation (Signed)
 Physical Therapy Evaluation Patient Details Name: Leslie Ward MRN: [id number] DOB: 1931-05-22 Today's Date: 07/19/2024  History of Present Illness  Leslie Ward is a 89 y.o. female presented to ED with complaints of shortness of breath and hypoxia. CTA: No pulmonary embolism. Small bilateral pleural effusions with associated overlying compressive-type  atelectasis.  PMH: CVA, PAF, T2DM, HTN, HLD and hypothyroidism, vertigo  Clinical Impression  Pt admitted with above diagnosis. PTA, pt reports ind with RW for amb and self care tasks at home, using RW for community amb, pt lives in level entry/single level studio apartment next door to son, son assists with household chores and transportation. Pt able to mobilize to bedside with use of bedrail to power up with CGA, BUE assisting to power to stand from bed and toilet with supv. Pt uses RW to navigate room and bathroom environment, able to mobilize around obstacles and complete turns without difficulty, increased time and steps. Pt then able ot mab 180 ft out into hallway, completing head turns and 180 degree turns without LOB, slight increase time but functional. Pt doesn't appear dyspneic, denies shortness of breath and conversational throughout eval. Recommend HHPT for safe transition back to home and continued son support. Pt currently with functional limitations due to the deficits listed below (see PT Problem List). Pt will benefit from acute skilled PT to increase their independence and safety with mobility to allow discharge.           If plan is discharge home, recommend the following: Assistance with cooking/housework;Assist for transportation   Can travel by private vehicle        Equipment Recommendations None recommended by PT  Recommendations for Other Services       Functional Status Assessment Patient has had a recent decline in their functional status and demonstrates the ability to make significant improvements in  function in a reasonable and predictable amount of time.     Precautions / Restrictions Precautions Precautions: Fall Restrictions Weight Bearing Restrictions Per Provider Order: No      Mobility  Bed Mobility Overal bed mobility: Needs Assistance Bed Mobility: Supine to Sit     Supine to sit: Used rails, Contact guard     General bed mobility comments: increased time and effort, pt using bedrail to upright trunk and slide out to bedside    Transfers Overall transfer level: Needs assistance Equipment used: Rolling walker (2 wheels) Transfers: Sit to/from Stand Sit to Stand: Supervision           General transfer comment: supv for STS from bedside and toilet, BUE assisting to power up    Ambulation/Gait Ambulation/Gait assistance: Supervision Gait Distance (Feet): 180 Feet Assistive device: Rolling walker (2 wheels) Gait Pattern/deviations: Step-through pattern, Decreased stride length Gait velocity: functional     General Gait Details: step through gait pattern, maintains body close to RW frame while navigating room and bathroom set up, amb out into hallway with good head turns R/L without LOB, able to complete 180 degree turns without cues or assistance  Stairs            Wheelchair Mobility     Tilt Bed    Modified Rankin (Stroke Patients Only)       Balance Overall balance assessment: Needs assistance Sitting-balance support: Feet supported Sitting balance-Leahy Scale: Good Sitting balance - Comments: able to manage socks and slip ons   Standing balance support: During functional activity Standing balance-Leahy Scale: Fair Standing balance comment: static able to release BUE to  wash hands and face, dynamic using RW                             Pertinent Vitals/Pain Pain Assessment Pain Assessment: No/denies pain    Home Living Family/patient expects to be discharged to:: Private residence Living Arrangements:  Alone Available Help at Discharge: Family Type of Home: Apartment (pt reports living in studio apartment on the side of her son's home) Home Access: Level entry       Home Layout: One level Home Equipment: Agricultural Consultant (2 wheels);Grab bars - toilet;Grab bars - tub/shower;Shower seat      Prior Function Prior Level of Function : Independent/Modified Independent;Needs assist             Mobility Comments: pt reports using RW for ambulation in the home and community ADLs Comments: pt reports ind with self care, son completes household chores and provides transportation     Extremity/Trunk Assessment   Upper Extremity Assessment Upper Extremity Assessment: Overall WFL for tasks assessed    Lower Extremity Assessment Lower Extremity Assessment: Overall WFL for tasks assessed (AROM WFL, strength grossly 3+/5)       Communication   Communication Communication: No apparent difficulties    Cognition Arousal: Alert Behavior During Therapy: WFL for tasks assessed/performed   PT - Cognitive impairments: No apparent impairments                       PT - Cognition Comments: Pt pleasant, motivated, conversational, details life story of moving to [place] from [place] Following commands: Intact       Cueing       General Comments General comments (skin integrity, edema, etc.): HR 90-120 noted during session, pt conversational without dyspnea noted and pt denies shortness of breath    Exercises     Assessment/Plan    PT Assessment Patient needs continued PT services  PT Problem List Decreased activity tolerance;Decreased balance;Cardiopulmonary status limiting activity       PT Treatment Interventions DME instruction;Gait training;Functional mobility training;Therapeutic activities;Therapeutic exercise;Balance training;Neuromuscular re-education;Patient/family education    PT Goals (Current goals can be found in the Care Plan section)  Acute Rehab PT  Goals Patient Stated Goal: return home PT Goal Formulation: With patient Time For Goal Achievement: 08/02/24 Potential to Achieve Goals: Good    Frequency Min 2X/week     Co-evaluation               AM-PAC PT 6 Clicks Mobility  Outcome Measure Help needed turning from your back to your side while in a flat bed without using bedrails?: A Little Help needed moving from lying on your back to sitting on the side of a flat bed without using bedrails?: A Little Help needed moving to and from a bed to a chair (including a wheelchair)?: A Little Help needed standing up from a chair using your arms (e.g., wheelchair or bedside chair)?: A Little Help needed to walk in hospital room?: A Little Help needed climbing 3-5 steps with a railing? : A Little 6 Click Score: 18    End of Session Equipment Utilized During Treatment: Gait belt Activity Tolerance: Patient tolerated treatment well Patient left: in chair;with call bell/phone within reach;with chair alarm set Nurse Communication: Mobility status PT Visit Diagnosis: Other abnormalities of gait and mobility (R26.89)    Time: 9160-9088 PT Time Calculation (min) (ACUTE ONLY): 32 min   Charges:   PT  Evaluation $PT Eval Low Complexity: 1 Low PT Treatments $Gait Training: 8-22 mins PT General Charges $$ ACUTE PT VISIT: 1 Visit         Tori Antero Derosia PT, DPT 07/19/24, 9:22 AM

## 2024-07-19 NOTE — TOC Initial Note (Signed)
 Transition of Care [facility]) - Initial/Assessment Note    Patient Details  Name: Leslie Ward MRN: [id number] Date of Birth: 03-08-1931  [facility] CM/SW Contact:    Bascom Service, RN Phone Number: 07/19/2024, 3:24 PM  Clinical Narrative: Spoke to Jose(Son) about d/c plans-return home w/HHC-HHPT-no preference-await choices. Has own transport home.                  Expected Discharge Plan: Home w Home Health Services Barriers to Discharge: Continued Medical Work up   Patient Goals and CMS Choice Patient states their goals for this hospitalization and ongoing recovery are:: Home CMS Medicare.gov Compare Post Acute Care list provided to:: Patient Represenative (must comment) (Jose(son)) Choice offered to / list presented to : Adult Children [place] ownership interest in [facility].provided to:: Adult Children    Expected Discharge Plan and Services   Discharge Planning Services: CM Consult Post Acute Care Choice: Home Health Living arrangements for the past 2 months: Single Family Home                                      Prior Living Arrangements/Services Living arrangements for the past 2 months: Single Family Home Lives with:: Adult Children              Current home services: DME (rw)    Activities of Daily Living   ADL Screening (condition at time of admission) Independently performs ADLs?: Yes (appropriate for developmental age) Is the patient deaf or have difficulty hearing?: No Does the patient have difficulty seeing, even when wearing glasses/contacts?: No Does the patient have difficulty concentrating, remembering, or making decisions?: No  Permission Sought/Granted                  Emotional Assessment              Admission diagnosis:  Acute respiratory distress [R06.03] Acute on chronic diastolic congestive heart failure (HCC) [I50.33] Acute respiratory failure with hypoxia (HCC) [J96.01] Patient  Active Problem List   Diagnosis Date Noted   Acute respiratory failure with hypoxia secondary to acute on chronic diastolic CHF exacerbation 07/17/2024   Acute on chronic diastolic CHF (congestive heart failure) (HCC) 07/17/2024   Leukocytosis 07/17/2024   Gait abnormality 10/13/2023   Paroxysmal atrial fibrillation (HCC) 07/18/2023   Hypercoagulable state due to paroxysmal atrial fibrillation (HCC) 07/18/2023   Acute encephalopathy 06/11/2023   Osteoarthritis of right knee 12/18/2022   Glaucoma 11/26/2021   Cerebrovascular accident (CVA) (HCC) 10/25/2021   History of stroke 10/2021   Stroke (HCC) 10/2021   Syncope 10/09/2021   Primary open angle glaucoma (POAG) of both eyes, moderate stage 09/12/2021   Sedimentation rate elevation 08/24/2021   Bilateral shoulder pain 08/24/2021   Inflammatory arthritis 08/24/2021   Urinary frequency 08/24/2021   COVID 05/29/2021   Post-COVID chronic fatigue 05/29/2021   Post-COVID chronic joint pain 05/29/2021   Post-COVID chronic muscle pain 05/29/2021   BPPV (benign paroxysmal positional vertigo), right 01/03/2021   Thyroid  disease    Ganglion cyst 08/25/2020   Mixed incontinence 03/15/2020   Urgency incontinence 03/15/2020   Blepharitis of upper and lower eyelids of both eyes 01/07/2020   Dermatochalasis of both upper eyelids 01/07/2020   Hypertensive retinopathy of both eyes 01/07/2020   Macular RPE mottling 01/07/2020   Meibomian gland dysfunction (MGD) of both eyes 01/07/2020   Vitreous syneresis of both  eyes 01/07/2020   Vertigo 12/01/2019   Cervical dystonia 03/08/2019   Hyperlipidemia 02/11/2019   Edema 02/11/2019   Hypertension 01/19/2019   Type 2 diabetes mellitus (HCC) 01/19/2019   Hypothyroidism (acquired) 01/19/2019   Neck pain 01/19/2019   Dystonia 01/19/2019   Adhesive capsulitis of left shoulder 12/22/2018   PCP:  Dorina Loving, PA-C Pharmacy:   [facility] [id number] - [place], [place] - [address] [place] [address] Phone: [phone] Fax: [phone]  [facility] [address] [place] [address] Phone: [phone] Fax: [phone]  [id number] - [place], [place] - [address] [facility] [address] [place] [address] Phone: [phone] Fax: [phone]     Social Drivers of Health (SDOH) Social History: SDOH Screenings   Food Insecurity: No Food Insecurity (07/17/2024)  Housing: Low Risk  (07/17/2024)  Transportation Needs: No Transportation Needs (07/17/2024)  Utilities: Not At Risk (07/17/2024)  Alcohol  Screen: Low Risk  (12/18/2022)  Depression (PHQ2-9): Low Risk  (04/30/2024)  Financial Resource Strain: Low Risk  (10/01/2021)  Physical Activity: Inactive (10/01/2021)  Social Connections: Moderately Integrated (07/17/2024)  Stress: No Stress Concern Present (10/01/2021)  Tobacco Use: Low Risk  (07/17/2024)   SDOH Interventions:     Readmission Risk Interventions     No data to display

## 2024-07-19 NOTE — Progress Notes (Signed)
 Mobility Specialist - Progress Note   07/19/24 1139  Mobility  Activity Ambulated with assistance  Level of Assistance Standby assist, set-up cues, supervision of patient - no hands on  Assistive Device Front wheel walker  Distance Ambulated (ft) 180 ft  Range of Motion/Exercises Active  Activity Response Tolerated well  Mobility visit 1 Mobility  Mobility Specialist Start Time (ACUTE ONLY) 1125  Mobility Specialist Stop Time (ACUTE ONLY) 1139  Mobility Specialist Time Calculation (min) (ACUTE ONLY) 14 min   Pt was found in room and agreeable to mobilize in hallway. No complaints. At EOS returned to bed with all needs met. Call bell in reach.   Erminio Leos,  Mobility Specialist Can be reached via Secure Chat

## 2024-07-19 NOTE — Progress Notes (Signed)
 PROGRESS NOTE  Leslie Ward  FMW:[id number] DOB: 21-Jul-1931 DOA: 07/17/2024 PCP: Dorina Loving, PA-C   Brief Narrative: Patient is a 89 year old female with history of CVA, paroxysmal A-fib, diabetes mellitus, hypertension, hyperlipidemia, hypothyroidism who presented with shortness of breath from home.  She is intermittent at baseline, uses walker to ambulate.  On presentation, she was hypoxic and requiring supplemental  oxygen.  Lab work showed WC count of 18.8, elevated proBNP of 814.  Chest x-ray showed small bilateral pleural effusion, mild perihilar pulmonary edema.  CTA chest did not show any PE but showed small bilateral pleural effusion, mild interstitial edema.  Patient was admitted for the management of acute on chronic HFpEF, started on IV Lasix . Volume status is improving.  Monitoring 1 more day with additional dose of IV Lasix  today  Assessment & Plan:  Principal Problem:   Acute respiratory failure with hypoxia secondary to acute on chronic diastolic CHF exacerbation Active Problems:   Leukocytosis   Hypertension   Paroxysmal atrial fibrillation (HCC)   Type 2 diabetes mellitus (HCC)   Hypothyroidism (acquired)   History of stroke   Hyperlipidemia   Mixed incontinence   Acute on chronic diastolic CHF (congestive heart failure) (HCC)   Acute hypoxic respiratory failure secondary to CHF exacerbation: Hypoxic on presentation.  Secondary to CHF exacerbation.  Not on oxygen at home.  Now weaned to room air .respiratory viral panel negative.  Acute on chronic HFpEF: Presented with shortness of breath, elevated BNP.  Chest imaging suggestive of volume overload/pulm edema.  Started on IV Lasix .  Continue to monitor input/output, daily weight.  Last echo on 05/2001 showed EF of 60 to 65%, grade 1 diastolic dysfunction.  New echo showed EF of 65 to 70%, grade 1 diastolic dysfunction, normal right ventricular systolic function.  She may need Lasix  20 mg daily on  discharge  Hypertension: Takes blood pressure at home.  Continued  Paroxysmal A-fib: Currently in normal sinus rhythm.  On metoprolol  and Eliquis  for anticoagulation.  Monitor on telemetry.  Leukocytosis: Unclear etiology.  She received IV steroids given by EMS.  No signs of infection.  Improved  Type 2 diabetes: Recent A1c was 6.7.  Continue sliding scale.  Monitor blood sugars  Hypothyroidism: Continue home Synthyroid  History of stroke/hyperlipidemia: On statin, Eliquis .  No focal deficits.  Hypokalemia: Supplemented with potassium and corrected  Debility/deconditioning: Patient is independent at baseline.  Lives alone in an apartment.  Ambulates with the help of walker.   PT recommended home upon discharge         DVT prophylaxis:apixaban  (ELIQUIS ) tablet 2.5 mg Start: 07/17/24 1030 apixaban  (ELIQUIS ) tablet 2.5 mg     Code Status: Full Code  Family Communication: Called and discussed with son Jos on phone on 12/8  Patient status:Inpatient  Patient is from :Home  Anticipated discharge un:Ynfz  Estimated DC date: Tomorrow   Consultants: None  Procedures:None  Antimicrobials:  Anti-infectives (From admission, onward)    None       Subjective: Patient seen and examined at bedside today.  Hemodynamically stable.  She is off oxygen.  She was sitting on the chair.  Ate her breakfast.  She feels comfortable today.  Does not feel ready to go home today  Objective: Vitals:   07/18/24 1150 07/18/24 2058 07/19/24 0441 07/19/24 0459  BP: 113/60 (!) 140/66 132/61   Pulse: 67 80 64   Resp: 16 18 16    Temp: 98.2 F (36.8 C)  98.3 F (36.8 C)   TempSrc:  Oral  Oral   SpO2: 99% 94% 95%   Weight:    54.7 kg  Height:        Intake/Output Summary (Last 24 hours) at 07/19/2024 1125 Last data filed at 07/19/2024 0954 Gross per 24 hour  Intake 630 ml  Output 400 ml  Net 230 ml   Filed Weights   07/17/24 1235 07/18/24 0500 07/19/24 0459  Weight: 55.8 kg 54.6 kg  54.7 kg    Examination: General exam: Overall comfortable, not in distress, pleasant elderly female HEENT: PERRL Respiratory system: Mildly diminished sounds in bases Cardiovascular system: S1 & S2 heard, RRR.  Gastrointestinal system: Abdomen is nondistended, soft and nontender. Central nervous system: Alert and oriented Extremities: No edema, no clubbing ,no cyanosis Skin: No rashes, no ulcers,no icterus     Data Reviewed: I have personally reviewed following labs and imaging studies  CBC: Recent Labs  Lab 07/17/24 0319 07/18/24 0521  WBC 18.8* 12.5*  HGB 12.5 12.2  HCT 38.9 37.9  MCV 88.6 88.1  PLT 329 295   Basic Metabolic Panel: Recent Labs  Lab 07/17/24 0319 07/17/24 0953 07/18/24 0521 07/19/24 0508  NA 139  --  140 139  K 3.8  --  3.4* 4.0  CL 106  --  105 107  CO2 22  --  23 22  GLUCOSE 168*  --  137* 105*  BUN 17  --  22 37*  CREATININE 0.68  --  0.69 0.91  CALCIUM  9.5  --  9.4 9.2  MG  --  2.6*  --   --      Recent Results (from the past 240 hours)  Resp panel by RT-PCR (RSV, Flu A&B, Covid) Anterior Nasal Swab     Status: None   Collection Time: 07/17/24  7:03 AM   Specimen: Anterior Nasal Swab  Result Value Ref Range Status   SARS Coronavirus 2 by RT PCR NEGATIVE NEGATIVE Final    Comment: (NOTE) SARS-CoV-2 target nucleic acids are NOT DETECTED.  The SARS-CoV-2 RNA is generally detectable in upper respiratory specimens during the acute phase of infection. The lowest concentration of SARS-CoV-2 viral copies this assay can detect is 138 copies/mL. A negative result does not preclude SARS-Cov-2 infection and should not be used as the sole basis for treatment or other patient management decisions. A negative result may occur with  improper specimen collection/handling, submission of specimen other than nasopharyngeal swab, presence of viral mutation(s) within the areas targeted by this assay, and inadequate number of viral copies(<138  copies/mL). A negative result must be combined with clinical observations, patient history, and epidemiological information. The expected result is Negative.  Fact Sheet for Patients:  bloggercourse.com  Fact Sheet for Healthcare Providers:  seriousbroker.it  This test is no t yet approved or cleared by the [place]  FDA and  has been authorized for detection and/or diagnosis of SARS-CoV-2 by FDA under an Emergency Use Authorization (EUA). This EUA will remain  in effect (meaning this test can be used) for the duration of the COVID-19 declaration under Section 564(b)(1) of the Act, 21 U.S.C.section 360bbb-3(b)(1), unless the authorization is terminated  or revoked sooner.       Influenza A by PCR NEGATIVE NEGATIVE Final   Influenza B by PCR NEGATIVE NEGATIVE Final    Comment: (NOTE) The Xpert Xpress SARS-CoV-2/FLU/RSV plus assay is intended as an aid in the diagnosis of influenza from Nasopharyngeal swab specimens and should not be used as a sole basis for treatment.  Nasal washings and aspirates are unacceptable for Xpert Xpress SARS-CoV-2/FLU/RSV testing.  Fact Sheet for Patients: bloggercourse.com  Fact Sheet for Healthcare Providers: seriousbroker.it  This test is not yet approved or cleared by the [place]  FDA and has been authorized for detection and/or diagnosis of SARS-CoV-2 by FDA under an Emergency Use Authorization (EUA). This EUA will remain in effect (meaning this test can be used) for the duration of the COVID-19 declaration under Section 564(b)(1) of the Act, 21 U.S.C. section 360bbb-3(b)(1), unless the authorization is terminated or revoked.     Resp Syncytial Virus by PCR NEGATIVE NEGATIVE Final    Comment: (NOTE) Fact Sheet for Patients: bloggercourse.com  Fact Sheet for Healthcare  Providers: seriousbroker.it  This test is not yet approved or cleared by the [place]  FDA and has been authorized for detection and/or diagnosis of SARS-CoV-2 by FDA under an Emergency Use Authorization (EUA). This EUA will remain in effect (meaning this test can be used) for the duration of the COVID-19 declaration under Section 564(b)(1) of the Act, 21 U.S.C. section 360bbb-3(b)(1), unless the authorization is terminated or revoked.  Performed at [facility], [address], [place], [place] [address]   Respiratory (~20 pathogens) panel by PCR     Status: None   Collection Time: 07/17/24  7:03 AM   Specimen: Nasopharyngeal Swab; Respiratory  Result Value Ref Range Status   Adenovirus NOT DETECTED NOT DETECTED Final   Coronavirus 229E NOT DETECTED NOT DETECTED Final    Comment: (NOTE) The Coronavirus on the Respiratory Panel, DOES NOT test for the novel  Coronavirus (2019 nCoV)    Coronavirus HKU1 NOT DETECTED NOT DETECTED Final   Coronavirus NL63 NOT DETECTED NOT DETECTED Final   Coronavirus OC43 NOT DETECTED NOT DETECTED Final   Metapneumovirus NOT DETECTED NOT DETECTED Final   Rhinovirus / Enterovirus NOT DETECTED NOT DETECTED Final   Influenza A NOT DETECTED NOT DETECTED Final   Influenza B NOT DETECTED NOT DETECTED Final   Parainfluenza Virus 1 NOT DETECTED NOT DETECTED Final   Parainfluenza Virus 2 NOT DETECTED NOT DETECTED Final   Parainfluenza Virus 3 NOT DETECTED NOT DETECTED Final   Parainfluenza Virus 4 NOT DETECTED NOT DETECTED Final   Respiratory Syncytial Virus NOT DETECTED NOT DETECTED Final   Bordetella pertussis NOT DETECTED NOT DETECTED Final   Bordetella Parapertussis NOT DETECTED NOT DETECTED Final   Chlamydophila pneumoniae NOT DETECTED NOT DETECTED Final   Mycoplasma pneumoniae NOT DETECTED NOT DETECTED Final    Comment: Performed at [facility], [address] [place], [place] [address]      Radiology Studies: ECHOCARDIOGRAM COMPLETE Result Date: 07/17/2024    ECHOCARDIOGRAM REPORT   Patient Name:   LOUNA ROTHGEB Date of Exam: 07/17/2024 Medical Rec #:  [id number]        Height:       57.0 in Accession #:    [id number]       Weight:       123.0 lb Date of Birth:  1930-10-15        BSA:          1.463 m Patient Age:    89 years         BP:           124/57 mmHg Patient Gender: F                HR:           82 bpm. Exam Location:  Inpatient Procedure: 2D Echo, Color Doppler and Cardiac Doppler (Both Spectral and Color            Flow Doppler were utilized during procedure). Indications:     CHF I50.21  History:         Patient has prior history of Echocardiogram examinations, most                  recent 06/12/2023. Stroke; Risk Factors:Hypertension,                  Dyslipidemia and Diabetes.  Sonographer:     Tinnie Gosling RDCS Referring Phys:  [id number] ISAIAH GERALDS Diagnosing Phys: Wilbert Bihari MD IMPRESSIONS  1. There is a mid LV cavitary systolic gradient due to hyperdynamica LVF.[name] Left ventricular ejection fraction, by estimation, is 65 to 70%. The left ventricle has normal function. The left ventricle has no regional wall motion abnormalities. There is mild left ventricular hypertrophy of the basal-septal segment. Left ventricular diastolic parameters are consistent with Grade I diastolic dysfunction (impaired relaxation). Elevated left atrial pressure.  2. Right ventricular systolic function is normal. The right ventricular size is normal. Tricuspid regurgitation signal is inadequate for assessing PA pressure.  3. The mitral valve is degenerative. Mild mitral valve regurgitation. No evidence of mitral stenosis. Moderate mitral annular calcification.  4. The aortic valve is tricuspid. Aortic valve regurgitation is not visualized. Aortic valve sclerosis/calcification is present, without any evidence of aortic stenosis.  5. The inferior vena cava is normal in size with greater than 50%  respiratory variability, suggesting right atrial pressure of 3 mmHg. FINDINGS  Left Ventricle: There is a mid LV cavitary systolic gradient due to hyperdynamica LVF. Left ventricular ejection fraction, by estimation, is 65 to 70%. The left ventricle has normal function. The left ventricle has no regional wall motion abnormalities.  The left ventricular internal cavity size was normal in size. There is mild left ventricular hypertrophy of the basal-septal segment. Left ventricular diastolic parameters are consistent with Grade I diastolic dysfunction (impaired relaxation). Elevated  left atrial pressure. Right Ventricle: The right ventricular size is normal. No increase in right ventricular wall thickness. Right ventricular systolic function is normal. Tricuspid regurgitation signal is inadequate for assessing PA pressure. Left Atrium: Left atrial size was normal in size. Right Atrium: Right atrial size was normal in size. Pericardium: There is no evidence of pericardial effusion. Mitral Valve: The mitral valve is degenerative in appearance. Moderate mitral annular calcification. Mild mitral valve regurgitation. No evidence of mitral valve stenosis. Tricuspid Valve: The tricuspid valve is normal in structure. Tricuspid valve regurgitation is not demonstrated. No evidence of tricuspid stenosis. Aortic Valve: The aortic valve is tricuspid. Aortic valve regurgitation is not visualized. Aortic valve sclerosis/calcification is present, without any evidence of aortic stenosis. Pulmonic Valve: The pulmonic valve was normal in structure. Pulmonic valve regurgitation is trivial. No evidence of pulmonic stenosis. Aorta: The aortic root is normal in size and structure. Venous: The inferior vena cava is normal in size with greater than 50% respiratory variability, suggesting right atrial pressure of 3 mmHg. IAS/Shunts: No atrial level shunt detected by color flow Doppler.  LEFT VENTRICLE PLAX 2D LVIDd:         3.80 cm    Diastology LVIDs:         2.20 cm   LV e' medial:    6.64 cm/s LV PW:         1.00 cm   LV E/e' medial:  14.7 LV  IVS:        1.25 cm   LV e' lateral:   5.87 cm/s LVOT diam:     1.60 cm   LV E/e' lateral: 16.6 LV SV:         52 LV SV Index:   35 LVOT Area:     2.01 cm LV IVRT:       92 msec  RIGHT VENTRICLE             IVC RV S prime:     16.30 cm/s  IVC diam: 0.90 cm TAPSE (M-mode): 1.9 cm LEFT ATRIUM             Index LA diam:        3.30 cm 2.26 cm/m LA Vol (A2C):   36.2 ml 24.74 ml/m LA Vol (A4C):   33.0 ml 22.56 ml/m LA Biplane Vol: 35.2 ml 24.06 ml/m  AORTIC VALVE LVOT Vmax:   126.00 cm/s LVOT Vmean:  82.000 cm/s LVOT VTI:    0.257 m  AORTA Ao Root diam: 2.60 cm Ao Asc diam:  2.80 cm MITRAL VALVE MV Area (PHT): 3.28 cm     SHUNTS MV E velocity: 97.50 cm/s   Systemic VTI:  0.26 m MV A velocity: 132.00 cm/s  Systemic Diam: 1.60 cm MV E/A ratio:  0.74 Wilbert Bihari MD Electronically signed by Wilbert Bihari MD Signature Date/Time: 07/17/2024/4:30:05 PM    Final (Updated)     Scheduled Meds:  amLODipine   5 mg Oral BID   apixaban   2.5 mg Oral BID   artificial tears  1 drop Both Eyes BID   dorzolamide   1 drop Both Eyes BID   And   timolol   1 drop Both Eyes BID   [START ON 07/20/2024] furosemide   20 mg Oral Daily   guaiFENesin   600 mg Oral BID   insulin  aspart  0-9 Units Subcutaneous TID WC   latanoprost   1 drop Both Eyes QPM   levothyroxine   50 mcg Oral QAC breakfast   metoprolol  tartrate  50 mg Oral BID   Continuous Infusions:   LOS: 2 days   Ivonne Mustache, MD [facility] P12/03/2024, 11:25 AM

## 2024-07-20 ENCOUNTER — Other Ambulatory Visit: Payer: Self-pay

## 2024-07-20 ENCOUNTER — Other Ambulatory Visit (HOSPITAL_COMMUNITY): Payer: Self-pay

## 2024-07-20 LAB — BASIC METABOLIC PANEL WITH GFR
Anion gap: 11 (ref 5–15)
BUN: 32 mg/dL — ABNORMAL HIGH (ref 8–23)
CO2: 22 mmol/L (ref 22–32)
Calcium: 9 mg/dL (ref 8.9–10.3)
Chloride: 105 mmol/L (ref 98–111)
Creatinine, Ser: 0.83 mg/dL (ref 0.44–1.00)
GFR, Estimated: >60 mL/min (ref >60)
Glucose, Bld: 105 mg/dL — ABNORMAL HIGH (ref 70–99)
Potassium: 3.8 mmol/L (ref 3.5–5.1)
Sodium: 137 mmol/L (ref 135–145)

## 2024-07-20 LAB — GLUCOSE, CAPILLARY
Glucose-Capillary: 122 mg/dL — ABNORMAL HIGH (ref 70–99)
Glucose-Capillary: 138 mg/dL — ABNORMAL HIGH (ref 70–99)

## 2024-07-20 MED ORDER — FUROSEMIDE 20 MG PO TABS
20.0000 mg | ORAL_TABLET | Freq: Every day | ORAL | 0 refills | Status: DC
  Filled 2024-07-20 (×2): qty 30, 30d supply, fill #0

## 2024-07-20 MED ORDER — GUAIFENESIN ER 600 MG PO TB12
600.0000 mg | ORAL_TABLET | Freq: Two times a day (BID) | ORAL | 0 refills | Status: AC
  Filled 2024-07-20: qty 10, 5d supply, fill #0

## 2024-07-20 NOTE — Progress Notes (Signed)
 Heart Failure Navigator Progress Note  Assessed for Heart & Vascular TOC clinic readiness.  Patient does not meet criteria due to EF 65-70%, per MD patient will follow up in 1 week with her Primary care doctor. No HF TOC. .   Navigator will sign off at this time.   Stephane Haddock, BSN, Scientist, Clinical (histocompatibility And Immunogenetics) Only

## 2024-07-20 NOTE — Progress Notes (Signed)
 Mobility Specialist - Progress Note   07/20/24 0900  Mobility  Activity Ambulated with assistance  Level of Assistance Contact guard assist, steadying assist  Assistive Device Front wheel walker  Distance Ambulated (ft) 160 ft  Range of Motion/Exercises Active  Activity Response Tolerated well  Mobility Referral Yes  Mobility visit 1 Mobility  Mobility Specialist Start Time (ACUTE ONLY) [id number]  Mobility Specialist Stop Time (ACUTE ONLY) 0919  Mobility Specialist Time Calculation (min) (ACUTE ONLY) 14 min   Received in chair and agreed to mobility. Had no issues throughout session. Returned to chair with all needs met.  Cyndee Ada Mobility Specialist

## 2024-07-20 NOTE — Discharge Summary (Signed)
 Physician Discharge Summary  Leslie Ward FMW:[id number] DOB: 08/22/1930 DOA: 07/17/2024  PCP: Dorina Loving, PA-C  Admit date: 07/17/2024 Discharge date: 07/20/2024  Admitted From: Home Disposition:  Home  Discharge Condition:Stable CODE STATUS:FULL Diet recommendation: Heart Healthy   Brief/Interim Summary: Patient is a 89 year old female with history of CVA, paroxysmal A-fib, diabetes mellitus, hypertension, hyperlipidemia, hypothyroidism who presented with shortness of breath from home. She is intermittent at baseline, uses walker to ambulate. On presentation, she was hypoxic and requiring supplemental oxygen. Lab work showed WC count of 18.8, elevated proBNP of 814. Chest x-ray showed small bilateral pleural effusion, mild perihilar pulmonary edema. CTA chest did not show any PE but showed small bilateral pleural effusion, mild interstitial edema. Patient was admitted for the management of acute on chronic HFpEF, started on IV Lasix .Lasix  changed to oral. Volume status looks much better today.  Medically stable for discharge.  PT recommended home health  Following problems were addressed during the hospitalization:  Acute hypoxic respiratory failure secondary to CHF exacerbation: Hypoxic on presentation.  Secondary to CHF exacerbation.  Not on oxygen at home.  Now weaned to room air .respiratory viral panel negative.   Acute on chronic HFpEF: Presented with shortness of breath, elevated BNP.  Chest imaging suggestive of volume overload/pulm edema.  Started on IV Lasix .  Continue to monitor input/output, daily weight.  Last echo on 05/2001 showed EF of 60 to 65%, grade 1 diastolic dysfunction.  New echo showed EF of 65 to 70%, grade 1 diastolic dysfunction, normal right ventricular systolic function.  She will continue Lasix  20 mg daily on discharge.Do a BMP test in a week   Hypertension: Takes amlodipine  at home.  Continued   Paroxysmal A-fib: Currently in normal sinus rhythm.  On  metoprolol  and Eliquis  for anticoagulation.  Monitor on telemetry.   Leukocytosis: Unclear etiology.  She received IV steroids given by EMS.  No signs of infection.  Improved   Type 2 diabetes: Recent A1c was 6.7.  Diet controlled.  We recommend to follow-up with PCP for close monitoring.    Hypothyroidism: Continue home Synthyroid   History of stroke/hyperlipidemia: On statin, Eliquis .  No focal deficits.   Hypokalemia: Supplemented with potassium and corrected   Debility/deconditioning: Patient is independent at baseline.  Lives alone in an apartment.  Ambulates with the help of walker.   PT recommended home health upon discharge   Discharge Diagnoses:  Principal Problem:   Acute respiratory failure with hypoxia secondary to acute on chronic diastolic CHF exacerbation Active Problems:   Leukocytosis   Hypertension   Paroxysmal atrial fibrillation (HCC)   Type 2 diabetes mellitus (HCC)   Hypothyroidism (acquired)   History of stroke   Hyperlipidemia   Mixed incontinence   Acute on chronic diastolic CHF (congestive heart failure) (HCC)    Discharge Instructions  Discharge Instructions     Diet - low sodium heart healthy   Complete by: As directed    Discharge instructions   Complete by: As directed    1)Please take your medications as instructed 2)Follow up with your PCP in a week.  Do a BMP test during the follow-up to check your kidney function and potassium level   Increase activity slowly   Complete by: As directed       Allergies as of 07/20/2024       Reactions   Crestor [rosuvastatin Calcium ] Other (See Comments)   caused me to go into kidney failure        Medication List  STOP taking these medications    ezetimibe  10 MG tablet Commonly known as: ZETIA    fluconazole  150 MG tablet Commonly known as: Diflucan    guaiFENesin -codeine  100-10 MG/5ML syrup   hydrocortisone  25 MG suppository Commonly known as: ANUSOL -HC   mupirocin  ointment 2  % Commonly known as: BACTROBAN    predniSONE  20 MG tablet Commonly known as: DELTASONE        TAKE these medications    amLODipine  5 MG tablet Commonly known as: NORVASC  TAKE 1 TABLET BY MOUTH 2 TIMES A DAY What changed:  how much to take how to take this when to take this additional instructions   ARTIFICIAL TEARS OP Place 1 drop into both eyes in the morning and at bedtime.   ascorbic acid 500 MG tablet Commonly known as: VITAMIN C Take 500 mg by mouth daily.   atorvastatin  20 MG tablet Commonly known as: LIPITOR Tome 1 tableta (20 mg en total) por va oral diariamente. (Take 1 tablet (20 mg total) by mouth daily.)   benzonatate  100 MG capsule Commonly known as: TESSALON  Take 1 capsule (100 mg total) by mouth 3 (three) times daily as needed for cough.   CALCIUM  600 PO Take 1 tablet by mouth at bedtime.   Cinnamon 500 MG capsule Take 1,000 mg by mouth daily.   dorzolamide -timolol  2-0.5 % ophthalmic solution Commonly known as: COSOPT  Place 1 drop into both eyes 2 (two) times daily.   Eliquis  2.5 MG Tabs tablet Generic drug: apixaban  TAKE 1 TABLET BY MOUTH 2 TIMES A DAY   estradiol 0.01 % Crea vaginal cream Commonly known as: ESTRACE Place 1 Applicatorful vaginally at bedtime.   fluticasone  50 MCG/ACT nasal spray Commonly known as: FLONASE  Place 2 sprays into both nostrils daily. What changed:  when to take this reasons to take this   furosemide  20 MG tablet Commonly known as: LASIX  Take 1 tablet (20 mg total) by mouth daily. Start taking on: July 21, 2024   guaiFENesin  600 MG 12 hr tablet Commonly known as: MUCINEX  Take 1 tablet (600 mg total) by mouth 2 (two) times daily for 5 days.   latanoprost  0.005 % ophthalmic solution Commonly known as: XALATAN  Place 1 drop into both eyes every evening.   levothyroxine  50 MCG tablet Commonly known as: SYNTHROID  TAKE 1 TABLET BY MOUTH DAILY BEFORE BREAKFAST   metoprolol  tartrate 50 MG  tablet Commonly known as: LOPRESSOR  TAKE 1 TABLET BY MOUTH 2 TIMES A DAY   PROBIOTIC DAILY PO Take 1 tablet by mouth at bedtime.   Vitamin D 125 MCG (5000 UT) Caps Take 5,000 Units by mouth daily.        Follow-up Information     Saguier, Edward, PA-C. Schedule an appointment as soon as possible for a visit in 1 week(s).   Specialties: Internal Medicine, Family Medicine Contact information: [address] [place] [address] [phone]                Allergies  Allergen Reactions   Crestor [Rosuvastatin Calcium ] Other (See Comments)    caused me to go into kidney failure    Consultations: None   Procedures/Studies: ECHOCARDIOGRAM COMPLETE Result Date: 07/17/2024    ECHOCARDIOGRAM REPORT   Patient Name:   Leslie Ward Date of Exam: 07/17/2024 Medical Rec #:  [id number]        Height:       57.0 in Accession #:    [id number]       Weight:  123.0 lb Date of Birth:  May 11, 1931        BSA:          1.463 m Patient Age:    89 years         BP:           124/57 mmHg Patient Gender: F                HR:           82 bpm. Exam Location:  Inpatient Procedure: 2D Echo, Color Doppler and Cardiac Doppler (Both Spectral and Color            Flow Doppler were utilized during procedure). Indications:     CHF I50.21  History:         Patient has prior history of Echocardiogram examinations, most                  recent 06/12/2023. Stroke; Risk Factors:Hypertension,                  Dyslipidemia and Diabetes.  Sonographer:     Tinnie Gosling RDCS Referring Phys:  [id number] ISAIAH GERALDS Diagnosing Phys: Wilbert Bihari MD IMPRESSIONS  1. There is a mid LV cavitary systolic gradient due to hyperdynamica LVF.[name] Left ventricular ejection fraction, by estimation, is 65 to 70%. The left ventricle has normal function. The left ventricle has no regional wall motion abnormalities. There is mild left ventricular hypertrophy of the basal-septal segment. Left ventricular diastolic  parameters are consistent with Grade I diastolic dysfunction (impaired relaxation). Elevated left atrial pressure.  2. Right ventricular systolic function is normal. The right ventricular size is normal. Tricuspid regurgitation signal is inadequate for assessing PA pressure.  3. The mitral valve is degenerative. Mild mitral valve regurgitation. No evidence of mitral stenosis. Moderate mitral annular calcification.  4. The aortic valve is tricuspid. Aortic valve regurgitation is not visualized. Aortic valve sclerosis/calcification is present, without any evidence of aortic stenosis.  5. The inferior vena cava is normal in size with greater than 50% respiratory variability, suggesting right atrial pressure of 3 mmHg. FINDINGS  Left Ventricle: There is a mid LV cavitary systolic gradient due to hyperdynamica LVF. Left ventricular ejection fraction, by estimation, is 65 to 70%. The left ventricle has normal function. The left ventricle has no regional wall motion abnormalities.  The left ventricular internal cavity size was normal in size. There is mild left ventricular hypertrophy of the basal-septal segment. Left ventricular diastolic parameters are consistent with Grade I diastolic dysfunction (impaired relaxation). Elevated  left atrial pressure. Right Ventricle: The right ventricular size is normal. No increase in right ventricular wall thickness. Right ventricular systolic function is normal. Tricuspid regurgitation signal is inadequate for assessing PA pressure. Left Atrium: Left atrial size was normal in size. Right Atrium: Right atrial size was normal in size. Pericardium: There is no evidence of pericardial effusion. Mitral Valve: The mitral valve is degenerative in appearance. Moderate mitral annular calcification. Mild mitral valve regurgitation. No evidence of mitral valve stenosis. Tricuspid Valve: The tricuspid valve is normal in structure. Tricuspid valve regurgitation is not demonstrated. No evidence of  tricuspid stenosis. Aortic Valve: The aortic valve is tricuspid. Aortic valve regurgitation is not visualized. Aortic valve sclerosis/calcification is present, without any evidence of aortic stenosis. Pulmonic Valve: The pulmonic valve was normal in structure. Pulmonic valve regurgitation is trivial. No evidence of pulmonic stenosis. Aorta: The aortic root is normal in size and structure. Venous: The inferior vena  cava is normal in size with greater than 50% respiratory variability, suggesting right atrial pressure of 3 mmHg. IAS/Shunts: No atrial level shunt detected by color flow Doppler.  LEFT VENTRICLE PLAX 2D LVIDd:         3.80 cm   Diastology LVIDs:         2.20 cm   LV e' medial:    6.64 cm/s LV PW:         1.00 cm   LV E/e' medial:  14.7 LV IVS:        1.25 cm   LV e' lateral:   5.87 cm/s LVOT diam:     1.60 cm   LV E/e' lateral: 16.6 LV SV:         52 LV SV Index:   35 LVOT Area:     2.01 cm LV IVRT:       92 msec  RIGHT VENTRICLE             IVC RV S prime:     16.30 cm/s  IVC diam: 0.90 cm TAPSE (M-mode): 1.9 cm LEFT ATRIUM             Index LA diam:        3.30 cm 2.26 cm/m LA Vol (A2C):   36.2 ml 24.74 ml/m LA Vol (A4C):   33.0 ml 22.56 ml/m LA Biplane Vol: 35.2 ml 24.06 ml/m  AORTIC VALVE LVOT Vmax:   126.00 cm/s LVOT Vmean:  82.000 cm/s LVOT VTI:    0.257 m  AORTA Ao Root diam: 2.60 cm Ao Asc diam:  2.80 cm MITRAL VALVE MV Area (PHT): 3.28 cm     SHUNTS MV E velocity: 97.50 cm/s   Systemic VTI:  0.26 m MV A velocity: 132.00 cm/s  Systemic Diam: 1.60 cm MV E/A ratio:  0.74 Wilbert Bihari MD Electronically signed by Wilbert Bihari MD Signature Date/Time: 07/17/2024/4:30:05 PM    Final (Updated)    CT Angio Chest PE W and/or Wo Contrast Result Date: 07/17/2024 EXAM: CTA of the Chest with contrast for PE 07/17/2024 06:40:47 AM TECHNIQUE: CTA of the chest was performed after the administration of 60 mL of iohexol  (OMNIPAQUE ) 350 MG/ML injection. Multiplanar reformatted images are provided for review.  MIP images are provided for review. Automated exposure control, iterative reconstruction, and/or weight based adjustment of the mA/kV was utilized to reduce the radiation dose to as low as reasonably achievable. COMPARISON: 03/21/2024 CLINICAL HISTORY: Pulmonary embolism (PE) suspected, low to intermediate prob, positive D-dimer. FINDINGS: PULMONARY ARTERIES: Pulmonary arteries are adequately opacified for evaluation. No pulmonary embolism. Main pulmonary artery is normal in caliber. MEDIASTINUM: Atherosclerosis and multivessel coronary artery calcifications. Aberrant right subclavian artery courses posterior to the esophagus. There is no acute abnormality of the thoracic aorta. LYMPH NODES: No mediastinal, hilar or axillary lymphadenopathy. LUNGS AND PLEURA: Small bilateral pleural effusions. Mild increased interlobular septal thickening in the lung bases. Compressive-type atelectasis overlies both pleural effusions. Scattered areas of patchy ground glass attenuation and subsegmental atelectasis in the mid and upper lung zones. No consolidative change or signs of pneumothorax. No pulmonary edema. UPPER ABDOMEN: Limited images of the upper abdomen are unremarkable. SOFT TISSUES AND BONES: Mild multilevel endplate degenerative changes in the thoracic spine. No acute soft tissue abnormality. IMPRESSION: 1. No pulmonary embolism. 2. Small bilateral pleural effusions with associated overlying compressive-type atelectasis. 3. Possible mild interstitial edema. Correlate for any clinical signs or symptoms of congestive heart failure. 4. Aortic atherosclerosis and coronary artery calcifications.  Electronically signed by: Waddell Calk MD 07/17/2024 06:46 AM EST RP Workstation: [id number]   DG Chest 2 View Result Date: 07/17/2024 EXAM: 2 VIEW(S) XRAY OF THE CHEST 07/17/2024 04:03:00 AM COMPARISON: 04/18/2024 CLINICAL HISTORY: shortness of breath FINDINGS: LINES, TUBES AND DEVICES: Loop recorder device overlies left chest.  LUNGS AND PLEURA: Small bilateral pleural effusions. Mild perihilar pulmonary edema. No focal pulmonary opacity. No pneumothorax. HEART AND MEDIASTINUM: Aortic atherosclerosis. Together, the findings keeping with mild cardiogenic failure. BONES AND SOFT TISSUES: No acute osseous abnormality. IMPRESSION: 1. Small bilateral pleural effusions and mild perihilar pulmonary edema, consistent with mild cardiogenic failure. Electronically signed by: Dorethia Molt MD 07/17/2024 04:15 AM EST RP Workstation: [id number]   CUP PACEART REMOTE DEVICE CHECK Result Date: 07/06/2024 ILR summary report received. Battery status OK. Normal device function. No new symptom, tachy, brady, or pause episodes. 7 new AF episodes, longest duration in duration, burden 0.3%, Eliquis  per PA report.  Monthly summary reports and ROV/PRN.  No histogram data, acceptable HR graph LA, CVRS     Subjective: Patient seen and examined at bedside today.  Very comfortable.  Has been on room air since last 2 days.  No peripheral edema.  Looks  euvolemic.  Ready for discharge today to home.  I tried to call her son Aloysius to do discuss about discharge planning,call not received  Discharge Exam: Vitals:   07/19/24 2002 07/20/24 0508  BP: (!) 141/55 (!) 152/65  Pulse: 79 73  Resp: 20 18  Temp: 98 F (36.7 C) 98.1 F (36.7 C)  SpO2: 95% 95%   Vitals:   07/19/24 1346 07/19/24 2002 07/20/24 0500 07/20/24 0508  BP: (!) 138/58 (!) 141/55  (!) 152/65  Pulse: 64 79  73  Resp: 16 20  18   Temp: 97.7 F (36.5 C) 98 F (36.7 C)  98.1 F (36.7 C)  TempSrc: Oral Oral  Oral  SpO2: 97% 95%  95%  Weight:   55.8 kg   Height:        General: Pt is alert, awake, not in acute distress Cardiovascular: RRR, S1/S2 +, no rubs, no gallops Respiratory: CTA bilaterally, no wheezing, no rhonchi Abdominal: Soft, NT, ND, bowel sounds + Extremities: no edema, no cyanosis    The results of significant diagnostics from this hospitalization  (including imaging, microbiology, ancillary and laboratory) are listed below for reference.     Microbiology: Recent Results (from the past 240 hours)  Resp panel by RT-PCR (RSV, Flu A&B, Covid) Anterior Nasal Swab     Status: None   Collection Time: 07/17/24  7:03 AM   Specimen: Anterior Nasal Swab  Result Value Ref Range Status   SARS Coronavirus 2 by RT PCR NEGATIVE NEGATIVE Final    Comment: (NOTE) SARS-CoV-2 target nucleic acids are NOT DETECTED.  The SARS-CoV-2 RNA is generally detectable in upper respiratory specimens during the acute phase of infection. The lowest concentration of SARS-CoV-2 viral copies this assay can detect is 138 copies/mL. A negative result does not preclude SARS-Cov-2 infection and should not be used as the sole basis for treatment or other patient management decisions. A negative result may occur with  improper specimen collection/handling, submission of specimen other than nasopharyngeal swab, presence of viral mutation(s) within the areas targeted by this assay, and inadequate number of viral copies(<138 copies/mL). A negative result must be combined with clinical observations, patient history, and epidemiological information. The expected result is Negative.  Fact Sheet for Patients:  bloggercourse.com  Fact Sheet for Healthcare  Providers:  seriousbroker.it  This test is no t yet approved or cleared by the [place]  FDA and  has been authorized for detection and/or diagnosis of SARS-CoV-2 by FDA under an Emergency Use Authorization (EUA). This EUA will remain  in effect (meaning this test can be used) for the duration of the COVID-19 declaration under Section 564(b)(1) of the Act, 21 U.S.C.section 360bbb-3(b)(1), unless the authorization is terminated  or revoked sooner.       Influenza A by PCR NEGATIVE NEGATIVE Final   Influenza B by PCR NEGATIVE NEGATIVE Final    Comment: (NOTE) The  Xpert Xpress SARS-CoV-2/FLU/RSV plus assay is intended as an aid in the diagnosis of influenza from Nasopharyngeal swab specimens and should not be used as a sole basis for treatment. Nasal washings and aspirates are unacceptable for Xpert Xpress SARS-CoV-2/FLU/RSV testing.  Fact Sheet for Patients: bloggercourse.com  Fact Sheet for Healthcare Providers: seriousbroker.it  This test is not yet approved or cleared by the [place]  FDA and has been authorized for detection and/or diagnosis of SARS-CoV-2 by FDA under an Emergency Use Authorization (EUA). This EUA will remain in effect (meaning this test can be used) for the duration of the COVID-19 declaration under Section 564(b)(1) of the Act, 21 U.S.C. section 360bbb-3(b)(1), unless the authorization is terminated or revoked.     Resp Syncytial Virus by PCR NEGATIVE NEGATIVE Final    Comment: (NOTE) Fact Sheet for Patients: bloggercourse.com  Fact Sheet for Healthcare Providers: seriousbroker.it  This test is not yet approved or cleared by the [place]  FDA and has been authorized for detection and/or diagnosis of SARS-CoV-2 by FDA under an Emergency Use Authorization (EUA). This EUA will remain in effect (meaning this test can be used) for the duration of the COVID-19 declaration under Section 564(b)(1) of the Act, 21 U.S.C. section 360bbb-3(b)(1), unless the authorization is terminated or revoked.  Performed at [facility], [address], [place], [place] [address]   Respiratory (~20 pathogens) panel by PCR     Status: None   Collection Time: 07/17/24  7:03 AM   Specimen: Nasopharyngeal Swab; Respiratory  Result Value Ref Range Status   Adenovirus NOT DETECTED NOT DETECTED Final   Coronavirus 229E NOT DETECTED NOT DETECTED Final    Comment: (NOTE) The Coronavirus on the Respiratory Panel, DOES  NOT test for the novel  Coronavirus (2019 nCoV)    Coronavirus HKU1 NOT DETECTED NOT DETECTED Final   Coronavirus NL63 NOT DETECTED NOT DETECTED Final   Coronavirus OC43 NOT DETECTED NOT DETECTED Final   Metapneumovirus NOT DETECTED NOT DETECTED Final   Rhinovirus / Enterovirus NOT DETECTED NOT DETECTED Final   Influenza A NOT DETECTED NOT DETECTED Final   Influenza B NOT DETECTED NOT DETECTED Final   Parainfluenza Virus 1 NOT DETECTED NOT DETECTED Final   Parainfluenza Virus 2 NOT DETECTED NOT DETECTED Final   Parainfluenza Virus 3 NOT DETECTED NOT DETECTED Final   Parainfluenza Virus 4 NOT DETECTED NOT DETECTED Final   Respiratory Syncytial Virus NOT DETECTED NOT DETECTED Final   Bordetella pertussis NOT DETECTED NOT DETECTED Final   Bordetella Parapertussis NOT DETECTED NOT DETECTED Final   Chlamydophila pneumoniae NOT DETECTED NOT DETECTED Final   Mycoplasma pneumoniae NOT DETECTED NOT DETECTED Final    Comment: Performed at [facility], [address], [place], [place] [address]     Labs: BNP (last 3 results) No results for input(s): BNP in the last 8760 hours. Basic Metabolic Panel: Recent  Labs  Lab 07/17/24 0319 07/17/24 0953 07/18/24 0521 07/19/24 0508 07/20/24 0458  NA 139  --  140 139 137  K 3.8  --  3.4* 4.0 3.8  CL 106  --  105 107 105  CO2 22  --  23 22 22   GLUCOSE 168*  --  137* 105* 105*  BUN 17  --  22 37* 32*  CREATININE 0.68  --  0.69 0.91 0.83  CALCIUM  9.5  --  9.4 9.2 9.0  MG  --  2.6*  --   --   --    Liver Function Tests: Recent Labs  Lab 07/17/24 0953 07/18/24 0521  AST 29 23  ALT 21 20  ALKPHOS 101 81  BILITOT 1.1 0.9  PROT 8.3* 7.2  ALBUMIN 4.1 3.8   No results for input(s): LIPASE, AMYLASE in the last 168 hours. No results for input(s): AMMONIA in the last 168 hours. CBC: Recent Labs  Lab 07/17/24 0319 07/18/24 0521  WBC 18.8* 12.5*  HGB 12.5 12.2  HCT 38.9 37.9  MCV 88.6 88.1  PLT 329 295   Cardiac  Enzymes: No results for input(s): CKTOTAL, CKMB, CKMBINDEX, TROPONINI in the last 168 hours. BNP: Invalid input(s): POCBNP CBG: Recent Labs  Lab 07/19/24 0746 07/19/24 1128 07/19/24 1700 07/19/24 2048 07/20/24 0720  GLUCAP 107* 148* 120* 156* 122*   D-Dimer No results for input(s): DDIMER in the last 72 hours. Hgb A1c No results for input(s): HGBA1C in the last 72 hours. Lipid Profile No results for input(s): CHOL, HDL, LDLCALC, TRIG, CHOLHDL, LDLDIRECT in the last 72 hours. Thyroid  function studies No results for input(s): TSH, T4TOTAL, T3FREE, THYROIDAB in the last 72 hours.  Invalid input(s): FREET3 Anemia work up No results for input(s): VITAMINB12, FOLATE, FERRITIN, TIBC, IRON, RETICCTPCT in the last 72 hours. Urinalysis    Component Value Date/Time   COLORURINE STRAW (A) 07/17/2024 0907   APPEARANCEUR CLEAR 07/17/2024 0907   LABSPEC 1.012 07/17/2024 0907   PHURINE 6.0 07/17/2024 0907   GLUCOSEU NEGATIVE 07/17/2024 0907   GLUCOSEU NEGATIVE 05/21/2021 1539   HGBUR NEGATIVE 07/17/2024 0907   BILIRUBINUR NEGATIVE 07/17/2024 0907   BILIRUBINUR negative 12/12/2023 0924   KETONESUR NEGATIVE 07/17/2024 0907   PROTEINUR NEGATIVE 07/17/2024 0907   UROBILINOGEN 0.2 12/12/2023 0924   UROBILINOGEN 0.2 05/21/2021 1539   NITRITE NEGATIVE 07/17/2024 0907   LEUKOCYTESUR NEGATIVE 07/17/2024 0907   Sepsis Labs Recent Labs  Lab 07/17/24 0319 07/18/24 0521  WBC 18.8* 12.5*   Microbiology Recent Results (from the past 240 hours)  Resp panel by RT-PCR (RSV, Flu A&B, Covid) Anterior Nasal Swab     Status: None   Collection Time: 07/17/24  7:03 AM   Specimen: Anterior Nasal Swab  Result Value Ref Range Status   SARS Coronavirus 2 by RT PCR NEGATIVE NEGATIVE Final    Comment: (NOTE) SARS-CoV-2 target nucleic acids are NOT DETECTED.  The SARS-CoV-2 RNA is generally detectable in upper respiratory specimens during the acute  phase of infection. The lowest concentration of SARS-CoV-2 viral copies this assay can detect is 138 copies/mL. A negative result does not preclude SARS-Cov-2 infection and should not be used as the sole basis for treatment or other patient management decisions. A negative result may occur with  improper specimen collection/handling, submission of specimen other than nasopharyngeal swab, presence of viral mutation(s) within the areas targeted by this assay, and inadequate number of viral copies(<138 copies/mL). A negative result must be combined with clinical observations, patient history,  and epidemiological information. The expected result is Negative.  Fact Sheet for Patients:  bloggercourse.com  Fact Sheet for Healthcare Providers:  seriousbroker.it  This test is no t yet approved or cleared by the [place]  FDA and  has been authorized for detection and/or diagnosis of SARS-CoV-2 by FDA under an Emergency Use Authorization (EUA). This EUA will remain  in effect (meaning this test can be used) for the duration of the COVID-19 declaration under Section 564(b)(1) of the Act, 21 U.S.C.section 360bbb-3(b)(1), unless the authorization is terminated  or revoked sooner.       Influenza A by PCR NEGATIVE NEGATIVE Final   Influenza B by PCR NEGATIVE NEGATIVE Final    Comment: (NOTE) The Xpert Xpress SARS-CoV-2/FLU/RSV plus assay is intended as an aid in the diagnosis of influenza from Nasopharyngeal swab specimens and should not be used as a sole basis for treatment. Nasal washings and aspirates are unacceptable for Xpert Xpress SARS-CoV-2/FLU/RSV testing.  Fact Sheet for Patients: bloggercourse.com  Fact Sheet for Healthcare Providers: seriousbroker.it  This test is not yet approved or cleared by the [place]  FDA and has been authorized for detection and/or diagnosis of  SARS-CoV-2 by FDA under an Emergency Use Authorization (EUA). This EUA will remain in effect (meaning this test can be used) for the duration of the COVID-19 declaration under Section 564(b)(1) of the Act, 21 U.S.C. section 360bbb-3(b)(1), unless the authorization is terminated or revoked.     Resp Syncytial Virus by PCR NEGATIVE NEGATIVE Final    Comment: (NOTE) Fact Sheet for Patients: bloggercourse.com  Fact Sheet for Healthcare Providers: seriousbroker.it  This test is not yet approved or cleared by the [place]  FDA and has been authorized for detection and/or diagnosis of SARS-CoV-2 by FDA under an Emergency Use Authorization (EUA). This EUA will remain in effect (meaning this test can be used) for the duration of the COVID-19 declaration under Section 564(b)(1) of the Act, 21 U.S.C. section 360bbb-3(b)(1), unless the authorization is terminated or revoked.  Performed at [facility], [address] [place], [place] [address]   Respiratory (~20 pathogens) panel by PCR     Status: None   Collection Time: 07/17/24  7:03 AM   Specimen: Nasopharyngeal Swab; Respiratory  Result Value Ref Range Status   Adenovirus NOT DETECTED NOT DETECTED Final   Coronavirus 229E NOT DETECTED NOT DETECTED Final    Comment: (NOTE) The Coronavirus on the Respiratory Panel, DOES NOT test for the novel  Coronavirus (2019 nCoV)    Coronavirus HKU1 NOT DETECTED NOT DETECTED Final   Coronavirus NL63 NOT DETECTED NOT DETECTED Final   Coronavirus OC43 NOT DETECTED NOT DETECTED Final   Metapneumovirus NOT DETECTED NOT DETECTED Final   Rhinovirus / Enterovirus NOT DETECTED NOT DETECTED Final   Influenza A NOT DETECTED NOT DETECTED Final   Influenza B NOT DETECTED NOT DETECTED Final   Parainfluenza Virus 1 NOT DETECTED NOT DETECTED Final   Parainfluenza Virus 2 NOT DETECTED NOT DETECTED Final   Parainfluenza Virus 3 NOT  DETECTED NOT DETECTED Final   Parainfluenza Virus 4 NOT DETECTED NOT DETECTED Final   Respiratory Syncytial Virus NOT DETECTED NOT DETECTED Final   Bordetella pertussis NOT DETECTED NOT DETECTED Final   Bordetella Parapertussis NOT DETECTED NOT DETECTED Final   Chlamydophila pneumoniae NOT DETECTED NOT DETECTED Final   Mycoplasma pneumoniae NOT DETECTED NOT DETECTED Final    Comment: Performed at [facility], [address], [place], [place] [address]  Please note: You were cared for by a hospitalist during your hospital stay. Once you are discharged, your primary care physician will handle any further medical issues. Please note that NO REFILLS for any discharge medications will be authorized once you are discharged, as it is imperative that you return to your primary care physician (or establish a relationship with a primary care physician if you do not have one) for your post hospital discharge needs so that they can reassess your need for medications and monitor your lab values.    Time coordinating discharge: 40 minutes  SIGNED:   Ivonne Mustache, MD  [facility] 07/20/2024, 9:40 AM Pager [phone]  If 7PM-7AM, please contact night-coverage [email or web address] Password [email or web address]

## 2024-07-20 NOTE — Progress Notes (Signed)
 Discharge medications delivered to patient at the bedside.

## 2024-07-20 NOTE — TOC Transition Note (Signed)
 Transition of Care [facility]) - Discharge Note   Patient Details  Name: Leslie Ward MRN: [id number] Date of Birth: Nov 25, 1930  Transition of [facility]) CM/SW Contact:  Bascom Service, RN Phone Number: 07/20/2024, 12:12 PM   Clinical Narrative:Spoke to Jose(son) about d/c-HHC agency chosen [facility]. Has own transport home.       Final next level of care: Home w Home Health Services Barriers to Discharge: No Barriers Identified   Patient Goals and CMS Choice Patient states their goals for this hospitalization and ongoing recovery are:: Home CMS Medicare.gov Compare Post Acute Care list provided to:: Patient Represenative (must comment) (Jose(son)) Choice offered to / list presented to : Adult Children [place] ownership interest in [facility].provided to:: Adult Children    Discharge Placement                       Discharge Plan and Services Additional resources added to the After Visit Summary for     Discharge Planning Services: CM Consult Post Acute Care Choice: Home Health                    HH Arranged: PT [facility] Agency: [facility] Date [facility] Agency Contacted: 07/20/24 Time HH Agency Contacted: 1211 Representative spoke with at [facility] Agency: [place]  Social Drivers of Health (SDOH) Interventions SDOH Screenings   Food Insecurity: No Food Insecurity (07/17/2024)  Housing: Low Risk  (07/17/2024)  Transportation Needs: No Transportation Needs (07/17/2024)  Utilities: Not At Risk (07/17/2024)  Alcohol  Screen: Low Risk  (12/18/2022)  Depression (PHQ2-9): Low Risk  (04/30/2024)  Financial Resource Strain: Low Risk  (10/01/2021)  Physical Activity: Inactive (10/01/2021)  Social Connections: Moderately Integrated (07/17/2024)  Stress: No Stress Concern Present (10/01/2021)  Tobacco Use: Low Risk  (07/17/2024)     Readmission Risk Interventions     No data to display

## 2024-07-20 NOTE — Care Management Important Message (Signed)
 Important Message  Patient Details IM Letter given. Name: Leslie Ward MRN: [id number] Date of Birth: 1931-03-24   Important Message Given:  Yes - Medicare IM     Melba Ates 07/20/2024, 3:18 PM

## 2024-07-20 NOTE — Progress Notes (Signed)
 Reviewed pts discharge instructions with her son Aloysius as well he understands importance of daily weights and monitoring them to prevent complications of CHF

## 2024-07-20 NOTE — Plan of Care (Signed)
  Problem: Education: Goal: Ability to describe self-care measures that may prevent or decrease complications (Diabetes Survival Skills Education) will improve Outcome: Adequate for Discharge Goal: Individualized Educational Video(s) Outcome: Adequate for Discharge   Problem: Coping: Goal: Ability to adjust to condition or change in health will improve Outcome: Adequate for Discharge   Problem: Fluid Volume: Goal: Ability to maintain a balanced intake and output will improve Outcome: Adequate for Discharge   Problem: Health Behavior/Discharge Planning: Goal: Ability to identify and utilize available resources and services will improve Outcome: Adequate for Discharge Goal: Ability to manage health-related needs will improve Outcome: Adequate for Discharge   Problem: Metabolic: Goal: Ability to maintain appropriate glucose levels will improve Outcome: Adequate for Discharge   Problem: Nutritional: Goal: Maintenance of adequate nutrition will improve Outcome: Adequate for Discharge Goal: Progress toward achieving an optimal weight will improve Outcome: Adequate for Discharge

## 2024-07-21 ENCOUNTER — Telehealth: Payer: Self-pay

## 2024-07-21 NOTE — Transitions of Care (Post Inpatient/ED Visit) (Signed)
° °  07/21/2024  Name: Leslie Ward MRN: [id number] DOB: [birth year]  Today's TOC FU Call Status: Today's TOC FU Call Status:: Successful TOC FU Call Completed TOC FU Call Complete Date: 07/21/24  Patient's Name and Date of Birth confirmed. Name, DOB  Transition Care Management Follow-up Telephone Call Date of Discharge: 07/20/24 Discharge Facility: [facility]) Type of Discharge: Inpatient Admission Primary Inpatient Discharge Diagnosis:: CHF How have you been since you were released from the hospital?: Better Any questions or concerns?: No  Items Reviewed: Did you receive and understand the discharge instructions provided?: Yes Medications obtained,verified, and reconciled?: Yes (Medications Reviewed) Any new allergies since your discharge?: No Dietary orders reviewed?: Yes Type of Diet Ordered:: Low Sodium Heart Healthy Do you have support at home?: Yes People in Home [RPT]: child(ren), adult Name of Support/Comfort Primary Source: Aloysius Cordial  Medications Reviewed Today: Medications Reviewed Today   Medications were not reviewed in this encounter     Home Care and Equipment/Supplies: Were Home Health Services Ordered?: Yes Name of Home Health Agency:: [facility] Has Agency set up a time to come to your home?: No EMR reviewed for Home Health Orders: Orders present/patient has not received call (refer to CM for follow-up) Any new equipment or medical supplies ordered?: No  Functional Questionnaire: Do you need assistance with bathing/showering or dressing?: No Do you need assistance with meal preparation?: Yes (Her son assist with meal prep) Do you need assistance with eating?: No Do you have difficulty maintaining continence: No Do you need assistance with getting out of bed/getting out of a chair/moving?: No Do you have difficulty managing or taking your medications?: Yes (Her sone preps the medications and administers her eye drops)  Follow up  appointments reviewed: PCP Follow-up appointment confirmed?: Yes Date of PCP follow-up appointment?: 07/27/24 Follow-up Provider: [facility] Follow-up appointment confirmed?: NA Do you need transportation to your follow-up appointment?: No Do you understand care options if your condition(s) worsen?: Yes-patient verbalized understanding  SDOH Interventions Today    Flowsheet Row Most Recent Value  SDOH Interventions   Food Insecurity Interventions Intervention Not Indicated  Housing Interventions Intervention Not Indicated  Transportation Interventions Intervention Not Indicated  Utilities Interventions Intervention Not Indicated    Medford Balboa, BSN, RN [place]  VBCI - [facility] RN Care Manager [phone]

## 2024-07-26 ENCOUNTER — Telehealth: Payer: Self-pay

## 2024-07-26 NOTE — Telephone Encounter (Signed)
 [facility] forms faxed with confirmation received

## 2024-07-27 ENCOUNTER — Ambulatory Visit (HOSPITAL_BASED_OUTPATIENT_CLINIC_OR_DEPARTMENT_OTHER)
Admission: RE | Admit: 2024-07-27 | Discharge: 2024-07-27 | Disposition: A | Source: Ambulatory Visit | Attending: Medical | Admitting: Medical

## 2024-07-27 ENCOUNTER — Encounter: Payer: Self-pay | Admitting: Medical

## 2024-07-27 ENCOUNTER — Ambulatory Visit: Payer: Self-pay | Admitting: Medical

## 2024-07-27 ENCOUNTER — Ambulatory Visit: Admitting: Medical

## 2024-07-27 VITALS — BP 137/70 | HR 74 | Temp 97.7°F | Resp 15 | Ht <= 58 in | Wt 120.2 lb

## 2024-07-27 DIAGNOSIS — R739 Hyperglycemia, unspecified: Secondary | ICD-10-CM

## 2024-07-27 DIAGNOSIS — I5033 Acute on chronic diastolic (congestive) heart failure: Secondary | ICD-10-CM

## 2024-07-27 DIAGNOSIS — E119 Type 2 diabetes mellitus without complications: Secondary | ICD-10-CM

## 2024-07-27 DIAGNOSIS — I480 Paroxysmal atrial fibrillation: Secondary | ICD-10-CM | POA: Diagnosis not present

## 2024-07-27 DIAGNOSIS — I10 Essential (primary) hypertension: Secondary | ICD-10-CM | POA: Diagnosis not present

## 2024-07-27 LAB — COMPREHENSIVE METABOLIC PANEL WITH GFR
ALT: 10 U/L (ref 3–35)
AST: 14 U/L (ref 5–37)
Albumin: 4.1 g/dL (ref 3.5–5.2)
Alkaline Phosphatase: 78 U/L (ref 39–117)
BUN: 27 mg/dL — ABNORMAL HIGH (ref 6–23)
CO2: 28 meq/L (ref 19–32)
Calcium: 9.3 mg/dL (ref 8.4–10.5)
Chloride: 101 meq/L (ref 96–112)
Creatinine, Ser: 0.88 mg/dL (ref 0.40–1.20)
GFR: 57.29 mL/min — ABNORMAL LOW (ref >60.00)
Glucose, Bld: 122 mg/dL — ABNORMAL HIGH (ref 70–99)
Potassium: 4 meq/L (ref 3.5–5.1)
Sodium: 137 meq/L (ref 135–145)
Total Bilirubin: 0.7 mg/dL (ref 0.2–1.2)
Total Protein: 7.3 g/dL (ref 6.0–8.3)

## 2024-07-27 LAB — HEMOGLOBIN A1C: Hgb A1c MFr Bld: 6.3 % (ref 4.6–6.5)

## 2024-07-27 LAB — BRAIN NATRIURETIC PEPTIDE: Pro B Natriuretic peptide (BNP): 136 pg/mL — ABNORMAL HIGH (ref 1.0–100.0)

## 2024-07-27 NOTE — Patient Instructions (Addendum)
 Chf(acute on chronic) flare recently. Recent hospitalization for acute hypoxic respiratory failure due to CHF. Oxygen saturation stable at 98%, heart rate 74 bpm. Weight discrepancy likely due to clothing. No significant dyspnea since discharge. - Ordered metabolic panel to recheck kidney function and potassium levels. - Ordered beta natriuretic peptide test to assess heart failure status. -chest xray today to assess status - Referred to cardiologist for further evaluation of CHF. [place].  Paroxysmal atrial fibrillation Managed with metoprolol  for rate control and Eliquis  for anticoagulation. No recent cardiology follow-up. - Continue metoprolol  for rate control. - Continue Eliquis  for anticoagulation. - Referred to cardiologist for evaluation of atrial fibrillation.(pt already established)  Hypertension Blood pressure 137/70 mmHg, normal for her. Current regimen includes amlodipine  and metoprolol . Discussed potential benefits of lower blood pressure to reduce cardiac strain, especially given her CHF. - Continue current antihypertensive regimen. - Monitor blood pressure regularly. - Will consider adding an ARB if blood pressure control needs improvement.  Zetia  dc'd in hospital due to side effect - Continue atorvastatin  as current statin therapy.   Follow up date to be determined after lab and cxr review

## 2024-07-27 NOTE — Progress Notes (Signed)
 Subjective:    Patient ID: Leslie Ward, female    DOB: [birth year], 89 y.o.   MRN: [id number]  HPI  Pt in for follow up from hospital.  Admit date: 07/17/2024 Discharge date: 07/20/2024   Admitted From: Home Disposition:  Home   Discharge Condition:Stable CODE STATUS:FULL Diet recommendation: Heart Healthy    Brief/Interim Summary: Patient is a 89 year old female with history of CVA, paroxysmal A-fib, diabetes mellitus, hypertension, hyperlipidemia, hypothyroidism who presented with shortness of breath from home. She is intermittent at baseline, uses walker to ambulate. On presentation, she was hypoxic and requiring supplemental oxygen. Lab work showed WC count of 18.8, elevated proBNP of 814. Chest x-ray showed small bilateral pleural effusion, mild perihilar pulmonary edema. CTA chest did not show any PE but showed small bilateral pleural effusion, mild interstitial edema. Patient was admitted for the management of acute on chronic HFpEF, started on IV Lasix .Lasix  changed to oral. Volume status looks much better today.  Medically stable for discharge.  PT recommended home health   Following problems were addressed during the hospitalization:   Acute hypoxic respiratory failure secondary to CHF exacerbation: Hypoxic on presentation.  Secondary to CHF exacerbation.  Not on oxygen at home.  Now weaned to room air .respiratory viral panel negative.   Acute on chronic HFpEF: Presented with shortness of breath, elevated BNP.  Chest imaging suggestive of volume overload/pulm edema.  Started on IV Lasix .  Continue to monitor input/output, daily weight.  Last echo on 05/2001 showed EF of 60 to 65%, grade 1 diastolic dysfunction.  New echo showed EF of 65 to 70%, grade 1 diastolic dysfunction, normal right ventricular systolic function.  She will continue Lasix  20 mg daily on discharge.Do a BMP test in a week   Hypertension: Takes amlodipine  at home.  Continued   Paroxysmal A-fib: Currently  in normal sinus rhythm.  On metoprolol  and Eliquis  for anticoagulation.  Monitor on telemetry.   Leukocytosis: Unclear etiology.  She received IV steroids given by EMS.  No signs of infection.  Improved   Type 2 diabetes: Recent A1c was 6.7.  Diet controlled.  We recommend to follow-up with PCP for close monitoring.     Hypothyroidism: Continue home Synthyroid   History of stroke/hyperlipidemia: On statin, Eliquis .  No focal deficits.   Hypokalemia: Supplemented with potassium and corrected   Debility/deconditioning: Patient is independent at baseline.  Lives alone in an apartment.  Ambulates with the help of walker.   PT recommended home health upon discharge   Discharge Diagnoses:  Principal Problem:   Acute respiratory failure with hypoxia secondary to acute on chronic diastolic CHF exacerbation Active Problems:   Leukocytosis   Hypertension   Paroxysmal atrial fibrillation (HCC)   Type 2 diabetes mellitus (HCC)   Hypothyroidism (acquired)   History of stroke   Hyperlipidemia   Mixed incontinence   Acute on chronic diastolic CHF (congestive heart failure) [facility])   Expand All Collapse All    Physician Discharge Summary  Leslie Ward FMW:[id number] DOB: [birth year] DOA: 07/17/2024   PCP: Dorina Loving, PA-C   Admit date: 07/17/2024 Discharge date: 07/20/2024   Admitted From: Home Disposition:  Home   Discharge Condition:Stable CODE STATUS:FULL Diet recommendation: Heart Healthy    Brief/Interim Summary: Patient is a 89 year old female with history of CVA, paroxysmal A-fib, diabetes mellitus, hypertension, hyperlipidemia, hypothyroidism who presented with shortness of breath from home. She is intermittent at baseline, uses walker to ambulate. On presentation, she was hypoxic and requiring supplemental oxygen. Lab work  showed WC count of 18.8, elevated proBNP of 814. Chest x-ray showed small bilateral pleural effusion, mild perihilar pulmonary edema. CTA chest did not  show any PE but showed small bilateral pleural effusion, mild interstitial edema. Patient was admitted for the management of acute on chronic HFpEF, started on IV Lasix .Lasix  changed to oral. Volume status looks much better today.  Medically stable for discharge.  PT recommended home health   Following problems were addressed during the hospitalization:   Acute hypoxic respiratory failure secondary to CHF exacerbation: Hypoxic on presentation.  Secondary to CHF exacerbation.  Not on oxygen at home.  Now weaned to room air .respiratory viral panel negative.   Acute on chronic HFpEF: Presented with shortness of breath, elevated BNP.  Chest imaging suggestive of volume overload/pulm edema.  Started on IV Lasix .  Continue to monitor input/output, daily weight.  Last echo on 05/2001 showed EF of 60 to 65%, grade 1 diastolic dysfunction.  New echo showed EF of 65 to 70%, grade 1 diastolic dysfunction, normal right ventricular systolic function.  She will continue Lasix  20 mg daily on discharge.Do a BMP test in a week   Hypertension: Takes amlodipine  at home.  Continued   Paroxysmal A-fib: Currently in normal sinus rhythm.  On metoprolol  and Eliquis  for anticoagulation.  Monitor on telemetry.   Leukocytosis: Unclear etiology.  She received IV steroids given by EMS.  No signs of infection.  Improved   Type 2 diabetes: Recent A1c was 6.7.  Diet controlled.  We recommend to follow-up with PCP for close monitoring.     Hypothyroidism: Continue home Synthyroid   History of stroke/hyperlipidemia: On statin, Eliquis .  No focal deficits.   Hypokalemia: Supplemented with potassium and corrected   Debility/deconditioning: Patient is independent at baseline.  Lives alone in an apartment.  Ambulates with the help of walker.   PT recommended home health upon discharge     Discharge Diagnoses:  Principal Problem:   Acute respiratory failure with hypoxia secondary to acute on chronic diastolic CHF  exacerbation Active Problems:   Leukocytosis   Hypertension   Paroxysmal atrial fibrillation (HCC)   Type 2 diabetes mellitus (HCC)   Hypothyroidism (acquired)   History of stroke   Hyperlipidemia   Mixed incontinence   Acute on chronic diastolic CHF (congestive heart failure) (HCC)       Discharge Instructions   Discharge Instructions       Diet - low sodium heart healthy   Complete by: As directed      Discharge instructions   Complete by: As directed      1)Please take your medications as instructed 2)Follow up with your PCP in a week.  Do a BMP test during the follow-up to check your kidney function and potassium level    Increase activity slowly   Complete by: As directed           Allergies as of 07/20/2024         Reactions    Crestor [rosuvastatin Calcium ] Other (See Comments)    caused me to go into kidney failure            Medication List       STOP taking these medications           STOP taking these medications     ezetimibe  10 MG tablet Commonly known as: ZETIA     fluconazole  150 MG tablet Commonly known as: Diflucan     guaiFENesin -codeine  100-10 MG/5ML syrup    hydrocortisone   25 MG suppository Commonly known as: ANUSOL -HC    mupirocin  ointment 2 % Commonly known as: BACTROBAN     predniSONE  20 MG tablet Commonly known as: DELTASONE            TAKE these medications     amLODipine  5 MG tablet Commonly known as: NORVASC  TAKE 1 TABLET BY MOUTH 2 TIMES A DAY What changed:  how much to take how to take this when to take this additional instructions    ARTIFICIAL TEARS OP Place 1 drop into both eyes in the morning and at bedtime.    ascorbic acid 500 MG tablet Commonly known as: VITAMIN C Take 500 mg by mouth daily.    atorvastatin  20 MG tablet Commonly known as: LIPITOR Tome 1 tableta (20 mg en total) por va oral diariamente. (Take 1 tablet (20 mg total) by mouth daily.)    benzonatate  100 MG capsule Commonly  known as: TESSALON  Take 1 capsule (100 mg total) by mouth 3 (three) times daily as needed for cough.    CALCIUM  600 PO Take 1 tablet by mouth at bedtime.    Cinnamon 500 MG capsule Take 1,000 mg by mouth daily.    dorzolamide -timolol  2-0.5 % ophthalmic solution Commonly known as: COSOPT  Place 1 drop into both eyes 2 (two) times daily.    Eliquis  2.5 MG Tabs tablet Generic drug: apixaban  TAKE 1 TABLET BY MOUTH 2 TIMES A DAY    estradiol 0.01 % Crea vaginal cream Commonly known as: ESTRACE Place 1 Applicatorful vaginally at bedtime.    fluticasone  50 MCG/ACT nasal spray Commonly known as: FLONASE  Place 2 sprays into both nostrils daily. What changed:  when to take this reasons to take this    furosemide  20 MG tablet Commonly known as: LASIX  Take 1 tablet (20 mg total) by mouth daily. Start taking on: July 21, 2024    guaiFENesin  600 MG 12 hr tablet Commonly known as: MUCINEX  Take 1 tablet (600 mg total) by mouth 2 (two) times daily for 5 days.    latanoprost  0.005 % ophthalmic solution Commonly known as: XALATAN  Place 1 drop into both eyes every evening.    levothyroxine  50 MCG tablet Commonly known as: SYNTHROID  TAKE 1 TABLET BY MOUTH DAILY BEFORE BREAKFAST    metoprolol  tartrate 50 MG tablet Commonly known as: LOPRESSOR  TAKE 1 TABLET BY MOUTH 2 TIMES A DAY    PROBIOTIC DAILY PO Take 1 tablet by mouth at bedtime.    Vitamin D 125 MCG (5000 UT) Caps Take 5,000 Units by mouth daily.     Discussed the use of AI scribe software for clinical note transcription with the patient, who gave verbal consent to proceed.  History of Present Illness   Leslie Ward is a 89 year old female with CHF and atrial fibrillation who presents for follow-up after hospitalization for acute hypoxic respiratory failure wit chf flare. She is accompanied by her son.  She was recently hospitalized for acute hypoxic respiratory failure from congestive heart failure. During that  stay she had leukocytosis and hypokalemia, and she was discharged with a walker.  Her discharge instructions included a repeat metabolic panel to reassess kidney function and potassium. Zetia  in hospital was stopped previously because of back pain. Her current medications are amlodipine  5 mg twice daily, metoprolol  50 mg twice daily, Lasix , atorvastatin , and Eliquis  for atrial fibrillation.  Her blood pressure is usually about 140/60. There was confusion about changing her amlodipine  dose in the hospital, but she remains on 5  mg twice daily. She has not had leg swelling.  She has not had significant shortness of breath since discharge. Her weight has been stable at 113-114 pounds.  She has stroke and paroxysmal atrial fibrillation on metoprolol  and Eliquis . She recently had a urological procedure with Botox and gel injections for incontinence.          Review of Systems  Constitutional:  Negative for chills, fatigue and fever.  HENT:  Negative for congestion.   Respiratory:  Negative for chest tightness, shortness of breath and wheezing.   Cardiovascular:  Negative for chest pain and palpitations.  Gastrointestinal:  Negative for abdominal pain, blood in stool and constipation.  Genitourinary:  Negative for dysuria.  Musculoskeletal:  Negative for back pain and myalgias.  Skin:  Negative for rash.  Neurological:  Negative for dizziness, weakness and numbness.  Hematological:  Negative for adenopathy.  Psychiatric/Behavioral:  Negative for behavioral problems and decreased concentration.     Past Medical History:  Diagnosis Date   Acute encephalopathy 06/11/2023   Adhesive capsulitis of left shoulder 12/22/2018   Bilateral shoulder pain 08/24/2021   Blepharitis of upper and lower eyelids of both eyes 01/07/2020   BPPV (benign paroxysmal positional vertigo), right 01/03/2021   Cerebrovascular accident (CVA) (HCC) 10/25/2021   Cervical dystonia 03/08/2019   Xeomin  approved  diagnosis.   COVID 05/29/2021   Dermatochalasis of both upper eyelids 01/07/2020   Dystonia    Edema 02/11/2019   Gait abnormality 10/13/2023   Ganglion cyst 08/25/2020   Glaucoma    History of stroke 10/2021   Hypercoagulable state due to paroxysmal atrial fibrillation (HCC) 07/18/2023   Hyperlipidemia 02/11/2019   Hypertension    Hypertensive retinopathy of both eyes 01/07/2020   Hypothyroidism (acquired) 01/19/2019   Inflammatory arthritis 08/24/2021   Macular RPE mottling 01/07/2020   Meibomian gland dysfunction (MGD) of both eyes 01/07/2020   Mixed incontinence 03/15/2020   Formatting of this note might be different from the original.  Added automatically from request for surgery [id number]   Neck pain 01/19/2019   Osteoarthritis of right knee 12/18/2022   Paroxysmal atrial fibrillation (HCC) 07/18/2023   Post-COVID chronic fatigue 05/29/2021   Post-COVID chronic joint pain 05/29/2021   Post-COVID chronic muscle pain 05/29/2021   Primary open angle glaucoma (POAG) of both eyes, moderate stage 09/12/2021   Sedimentation rate elevation 08/24/2021   Stroke (HCC) 10/2021   Stroke (HCC) 05/2023   Syncope 10/09/2021   Thyroid  disease    Type 2 diabetes mellitus (HCC) 01/19/2019   Urgency incontinence 03/15/2020   Formatting of this note might be different from the original.  Added automatically from request for surgery [id number]   Urinary frequency 08/24/2021   Vertigo 12/01/2019   Vitreous syneresis of both eyes 01/07/2020     Social History   Socioeconomic History   Marital status: Widowed    Spouse name: Not on file   Number of children: 2   Years of education: 6 or 7 years of education   Highest education level: Not on file  Occupational History   Occupation: Retired  Tobacco Use   Smoking status: Never    Passive exposure: Past   Smokeless tobacco: Never   Tobacco comments:    Never smoker 07/18/23  Vaping Use   Vaping status: Never Used  Substance and Sexual  Activity   Alcohol  use: No   Drug use: No   Sexual activity: Not on file  Other Topics Concern   Not on file  Social History Narrative   Lives alone.   Right-handed.   No caffeine  use.   Social Drivers of Health   Tobacco Use: Low Risk (07/27/2024)   Patient History    Smoking Tobacco Use: Never    Smokeless Tobacco Use: Never    Passive Exposure: Past  Financial Resource Strain: Low Risk (10/01/2021)   Overall Financial Resource Strain (CARDIA)    Difficulty of Paying Living Expenses: Not hard at all  Food Insecurity: No Food Insecurity (07/21/2024)   Epic    Worried About Programme Researcher, Broadcasting/film/video in the Last Year: Never true    Ran Out of Food in the Last Year: Never true  Transportation Needs: No Transportation Needs (07/21/2024)   Epic    Lack of Transportation (Medical): No    Lack of Transportation (Non-Medical): No  Physical Activity: Inactive (10/01/2021)   Exercise Vital Sign    Days of Exercise per Week: 0 days    Minutes of Exercise per Session: 0 min  Stress: No Stress Concern Present (10/01/2021)   [employer] of Occupational Health - Occupational Stress Questionnaire    Feeling of Stress : Not at all  Social Connections: Moderately Integrated (07/17/2024)   Social Connection and Isolation Panel    Frequency of Communication with Friends and Family: More than three times a week    Frequency of Social Gatherings with Friends and Family: Twice a week    Attends Religious Services: More than 4 times per year    Active Member of [employer] or Organizations: Yes    Attends Banker Meetings: More than 4 times per year    Marital Status: Widowed  Intimate Partner Violence: Not At Risk (07/21/2024)   Epic    Fear of Current or Ex-Partner: No    Emotionally Abused: No    Physically Abused: No    Sexually Abused: No  Depression (PHQ2-9): Low Risk (04/30/2024)   Depression (PHQ2-9)    PHQ-2 Score: 0  Alcohol  Screen: Low Risk (12/18/2022)   Alcohol  Screen     Last Alcohol  Screening Score (AUDIT): 0  Housing: Unknown (07/21/2024)   Epic    Unable to Pay for Housing in the Last Year: No    Number of Times Moved in the Last Year: Not on file    Homeless in the Last Year: No  Utilities: Not At Risk (07/21/2024)   Epic    Threatened with loss of utilities: No  Health Literacy: Not on file    Past Surgical History:  Procedure Laterality Date   LOOP RECORDER INSERTION N/A 06/13/2023   Procedure: LOOP RECORDER INSERTION;  Surgeon: Inocencio Soyla Lunger, MD;  Location: [facility];  Service: Cardiovascular;  Laterality: N/A;    Family History  Problem Relation Age of Onset   Stroke Mother    Heart attack Father    Heart Problems Brother    High blood pressure Maternal Grandmother    Liver disease Neg Hx    Colon cancer Neg Hx    Esophageal cancer Neg Hx     Allergies[1]  Medications Ordered Prior to Encounter[2]  BP 137/70   Pulse 74   Temp 97.7 F (36.5 C) (Oral)   Resp 15   Ht 4' 9 (1.448 m)   Wt 120 lb 3.2 oz (54.5 kg)   SpO2 98%   BMI 26.01 kg/m        Objective:   Physical Exam  General- No acute distress. Pleasant patient. Neck- Full range  of motion, no jvd Lungs- Clear, even and unlabored. Heart- regular rate and rhythm. Neurologic- CNII- XII grossly intact.  Lower ext- no pedal edema, calfs symmetric, negative homans signs.      Assessment & Plan:   Assessment and Plan    Patient Instructions  Chf(acute on chronic) flare recently. DC summary dx acute on chronic diastolic CHF exacerbation  Recent hospitalization for acute hypoxic respiratory failure due to CHF. Oxygen saturation stable at 98%, heart rate 74 bpm. Weight discrepancy likely due to clothing. No significant dyspnea since discharge. - Ordered metabolic panel to recheck kidney function and potassium levels. - Ordered beta natriuretic peptide test to assess heart failure status. -chest xray today to assess status - Referred to cardiologist  for further evaluation of CHF. [place].  Paroxysmal atrial fibrillation Managed with metoprolol  for rate control and Eliquis  for anticoagulation. No recent cardiology follow-up. - Continue metoprolol  for rate control. - Continue Eliquis  for anticoagulation. - Referred to cardiologist for evaluation of atrial fibrillation.(pt already established)  Hypertension Blood pressure 137/70 mmHg, normal for her. Current regimen includes amlodipine  and metoprolol . Discussed potential benefits of lower blood pressure to reduce cardiac strain, especially given her CHF. - Continue current antihypertensive regimen. - Monitor blood pressure regularly. - Will consider adding an ARB if blood pressure control needs improvement.  Zetia  dc'd in hospital due to side effect - Continue atorvastatin  as current statin therapy.   Follow up date to be determined after lab and cxr review        Dallas Maxwell, PA-C    I personally spent a total of 45 minutes in the care of the patient today including getting/reviewing separately obtained history, performing a medically appropriate exam/evaluation, counseling and educating, placing orders, referring and communicating with other health care professionals, and documenting clinical information in the EHR.      [1]  Allergies Allergen Reactions   Crestor [Rosuvastatin Calcium ] Other (See Comments)    caused me to go into kidney failure  [2]  Current Outpatient Medications on File Prior to Visit  Medication Sig Dispense Refill   amLODipine  (NORVASC ) 5 MG tablet TAKE 1 TABLET BY MOUTH 2 TIMES A DAY 120 tablet 0   apixaban  (ELIQUIS ) 2.5 MG TABS tablet TAKE 1 TABLET BY MOUTH 2 TIMES A DAY 60 tablet 11   atorvastatin  (LIPITOR) 20 MG tablet Take 1 tablet (20 mg total) by mouth daily. 90 tablet 3   Calcium  Carbonate (CALCIUM  600 PO) Take 1 tablet by mouth at bedtime.     Carboxymethylcellulose Sodium (ARTIFICIAL TEARS OP) Place 1 drop into both eyes in the  morning and at bedtime.     Cholecalciferol (VITAMIN D) 125 MCG (5000 UT) CAPS Take 5,000 Units by mouth daily.     Cinnamon 500 MG capsule Take 1,000 mg by mouth daily.     dorzolamide -timolol  (COSOPT ) 2-0.5 % ophthalmic solution Place 1 drop into both eyes 2 (two) times daily.     estradiol (ESTRACE) 0.01 % CREA vaginal cream Place 1 Applicatorful vaginally at bedtime.     fluticasone  (FLONASE ) 50 MCG/ACT nasal spray Place 2 sprays into both nostrils daily. (Patient taking differently: Place 2 sprays into both nostrils daily as needed for allergies.) 16 g 1   furosemide  (LASIX ) 20 MG tablet Take 1 tablet (20 mg total) by mouth daily. 30 tablet 0   latanoprost  (XALATAN ) 0.005 % ophthalmic solution Place 1 drop into both eyes every evening.     levothyroxine  (SYNTHROID ) 50 MCG tablet TAKE  1 TABLET BY MOUTH DAILY BEFORE BREAKFAST 30 tablet 0   metoprolol  tartrate (LOPRESSOR ) 50 MG tablet TAKE 1 TABLET BY MOUTH 2 TIMES A DAY 180 tablet 0   Probiotic Product (PROBIOTIC DAILY PO) Take 1 tablet by mouth at bedtime.     vitamin C (ASCORBIC ACID) 500 MG tablet Take 500 mg by mouth daily.     benzonatate  (TESSALON ) 100 MG capsule Take 1 capsule (100 mg total) by mouth 3 (three) times daily as needed for cough. (Patient not taking: Reported on 07/27/2024) 30 capsule 0   Current Facility-Administered Medications on File Prior to Visit  Medication Dose Route Frequency Provider Last Rate Last Admin   incobotulinumtoxinA  (XEOMIN ) 100 units injection 100 Units  100 Units Intramuscular Q90 days Onita Duos, MD       incobotulinumtoxinA  (XEOMIN ) 100 units injection 100 Units  100 Units Intramuscular Q90 days Onita Duos, MD   100 Units at 05/03/22 2307

## 2024-08-02 ENCOUNTER — Telehealth: Payer: Self-pay

## 2024-08-02 ENCOUNTER — Encounter

## 2024-08-02 NOTE — Telephone Encounter (Signed)
 [facility] forms faxed with confirmation recieved

## 2024-08-04 ENCOUNTER — Ambulatory Visit

## 2024-08-04 DIAGNOSIS — I480 Paroxysmal atrial fibrillation: Secondary | ICD-10-CM | POA: Diagnosis not present

## 2024-08-05 ENCOUNTER — Encounter

## 2024-08-05 LAB — CUP PACEART REMOTE DEVICE CHECK
Date Time Interrogation Session: 20251223232440
Implantable Pulse Generator Implant Date: 20241101

## 2024-08-06 ENCOUNTER — Ambulatory Visit: Payer: Self-pay | Admitting: Cardiology

## 2024-08-06 NOTE — Progress Notes (Signed)
 Remote Loop Recorder Transmission

## 2024-08-08 ENCOUNTER — Encounter: Payer: Self-pay | Admitting: Medical

## 2024-08-09 ENCOUNTER — Other Ambulatory Visit: Payer: Self-pay

## 2024-08-09 ENCOUNTER — Other Ambulatory Visit: Payer: Self-pay | Admitting: Medical

## 2024-08-09 MED ORDER — FUROSEMIDE 20 MG PO TABS: 20.0000 mg | ORAL_TABLET | Freq: Every day | ORAL | 0 refills | Status: DC

## 2024-08-17 ENCOUNTER — Other Ambulatory Visit: Payer: Self-pay | Admitting: Medical

## 2024-08-25 ENCOUNTER — Ambulatory Visit (HOSPITAL_COMMUNITY): Admitting: Internal Medicine

## 2024-09-01 ENCOUNTER — Ambulatory Visit (HOSPITAL_COMMUNITY)
Admission: RE | Admit: 2024-09-01 | Discharge: 2024-09-01 | Disposition: A | Discharge status: Home / Self Care | Source: Ambulatory Visit | Attending: Internal Medicine | Admitting: Internal Medicine

## 2024-09-01 ENCOUNTER — Encounter (HOSPITAL_COMMUNITY): Payer: Self-pay | Admitting: Internal Medicine

## 2024-09-01 VITALS — BP 116/60 | HR 75 | Ht <= 58 in | Wt 120.6 lb

## 2024-09-01 DIAGNOSIS — D6869 Other thrombophilia: Secondary | ICD-10-CM

## 2024-09-01 DIAGNOSIS — I480 Paroxysmal atrial fibrillation: Secondary | ICD-10-CM | POA: Diagnosis not present

## 2024-09-01 NOTE — Progress Notes (Signed)
 "   Primary Care Physician: Dorina Loving, PA-C Primary Cardiologist: Redell Leiter, MD Electrophysiologist: None     Referring Physician: Device clinic     Leslie Ward is a 89 y.o. female with a history of HTN, ischemic CVA, diastolic CHF, aortic atherosclerosis by CT imaging, cervical dystonia, chronic tremor, glaucoma, hypothyroidism, T2DM, and paroxysmal atrial fibrillation who presents for consultation in the [facility]. Recent hospital admission 10/30-11/1 for CVA and underwent ILR placement on 06/13/23. Device clinic alert for new PAF (22 minute episode) on 07/09/23. Review of discharge shows patient was previously on ASA, plavix  added upon discharge, and plan to have patient remain on plavix  only after 3 weeks of dual therapy. Patient is not on anticoagulation. She has a CHADS2VASC score of 9.  On evaluation today, she is currently in NSR. Patient and her son note she has a lot of arthritic complaints, specifically upper shoulders bilaterally, and quality of life has been a challenge since stopping meloxicam  for ASA/plavix  therapy. She is currently still taking dual therapy ASA and plavix  daily. She is currently on prednisone  taper for the arthritic pains but do note history of glaucoma.   On follow up 08/22/23, she is currently in NSR. Patient ultimately decided after last OV to begin Eliquis  2.5 mg BID (noted in phone call 08/08/23). She had an episode of Afib on 08/02/23 for 1 hour. ILR review by Dr. Inocencio on 08/14/23 showed overall Afib burden of 0.2%. No bleeding issues on Eliquis . She continues with arthritic pains in both shoulders and is taking tylenol  per son to help with this.   Follow up 09/01/24. Patient is currently in NSR. Hospital admission 12/6-04/2024 for acute on chronic HFpEF. Review of ILR shows 0.3% Afib burden with most recent episode of Afib on 08/31/24 for 38 minutes. No missed doses of Eliquis .   Today, she denies symptoms of  palpitations, chest pain, shortness of breath, orthopnea, PND, lower extremity edema, dizziness, presyncope, syncope, snoring, daytime somnolence, bleeding, or neurologic sequela. The patient is tolerating medications without difficulties and is otherwise without complaint today.   she has a BMI of Body mass index is 26.1 kg/m.[name] Filed Weights   09/01/24 1142  Weight: 54.7 kg      Current Outpatient Medications  Medication Sig Dispense Refill   amLODipine  (NORVASC ) 5 MG tablet TAKE 1 TABLET BY MOUTH 2 TIMES A DAY 120 tablet 0   apixaban  (ELIQUIS ) 2.5 MG TABS tablet TAKE 1 TABLET BY MOUTH 2 TIMES A DAY 60 tablet 11   atorvastatin  (LIPITOR) 20 MG tablet Take 1 tablet (20 mg total) by mouth daily. 90 tablet 3   Calcium  Carbonate (CALCIUM  600 PO) Take 1 tablet by mouth at bedtime.     Carboxymethylcellulose Sodium (ARTIFICIAL TEARS OP) Place 1 drop into both eyes in the morning and at bedtime.     Cholecalciferol (VITAMIN D) 125 MCG (5000 UT) CAPS Take 5,000 Units by mouth daily.     Cinnamon 500 MG capsule Take 1,000 mg by mouth daily.     dorzolamide -timolol  (COSOPT ) 2-0.5 % ophthalmic solution Place 1 drop into both eyes 2 (two) times daily.     estradiol (ESTRACE) 0.01 % CREA vaginal cream Place 1 Applicatorful vaginally at bedtime.     fluticasone  (FLONASE ) 50 MCG/ACT nasal spray Place 2 sprays into both nostrils daily. (Patient taking differently: Place 2 sprays into both nostrils daily as needed for allergies.) 16 g 1   furosemide  (LASIX ) 20 MG tablet Take 1 tablet (  20 mg total) by mouth daily. 30 tablet 0   latanoprost  (XALATAN ) 0.005 % ophthalmic solution Place 1 drop into both eyes every evening.     levothyroxine  (SYNTHROID ) 50 MCG tablet Take 1 tablet (50 mcg total) by mouth daily before breakfast. 90 tablet 0   metoprolol  tartrate (LOPRESSOR ) 50 MG tablet TAKE 1 TABLET BY MOUTH 2 TIMES A DAY 180 tablet 0   Probiotic Product (PROBIOTIC DAILY PO) Take 1 tablet by mouth at bedtime.      vitamin C (ASCORBIC ACID) 500 MG tablet Take 500 mg by mouth daily.     Current Facility-Administered Medications  Medication Dose Route Frequency Provider Last Rate Last Admin   incobotulinumtoxinA  (XEOMIN ) 100 units injection 100 Units  100 Units Intramuscular Q90 days Onita Duos, MD       incobotulinumtoxinA  (XEOMIN ) 100 units injection 100 Units  100 Units Intramuscular Q90 days Onita Duos, MD   100 Units at 05/03/22 2307    Atrial Fibrillation Management history:  Previous antiarrhythmic drugs: none Previous cardioversions: none Previous ablations: none Anticoagulation history: Eliquis  2.5 mg BID   ROS- All systems are reviewed and negative except as per the HPI above.  Physical Exam: BP 116/60   Pulse 75   Ht 4' 9 (1.448 m)   Wt 54.7 kg   BMI 26.10 kg/m   GEN- The patient is well appearing, alert and oriented x 3 today.   Neck - no JVD or carotid bruit noted Lungs- Clear to ausculation bilaterally, normal work of breathing Heart- Regular rate and rhythm, no murmurs, rubs or gallops, PMI not laterally displaced Extremities- no clubbing, cyanosis, or edema Skin - no rash or ecchymosis noted    EKG today demonstrates  EKG Interpretation Date/Time:  Wednesday September 01 2024 11:46:56 EST Ventricular Rate:  75 PR Interval:  144 QRS Duration:  68 QT Interval:  382 QTC Calculation: 426 R Axis:   40  Text Interpretation: Normal sinus rhythm Cannot rule out Anterior infarct , age undetermined Abnormal ECG When compared with ECG of 17-Jul-2024 03:13, PREVIOUS ECG IS PRESENT Confirmed by Terra Pac ([id number]) on 09/01/2024 11:49:16 AM    Echo 06/12/23 demonstrated  1. Left ventricular ejection fraction, by estimation, is 60 to 65%. The  left ventricle has normal function. The left ventricle has no regional  wall motion abnormalities. Left ventricular diastolic parameters are  consistent with Grade I diastolic  dysfunction (impaired relaxation).   2. Right  ventricular systolic function is normal. The right ventricular  size is normal. Tricuspid regurgitation signal is inadequate for assessing  PA pressure.   3. Left atrial size was moderately dilated.   4. The mitral valve was not well visualized. No evidence of mitral valve  regurgitation.   5. The aortic valve was not well visualized. Aortic valve regurgitation  is not visualized.   6. The inferior vena cava is normal in size with greater than 50%  respiratory variability, suggesting right atrial pressure of 3 mmHg.   CHA2DS2-VASc Score = 9  The patient's score is based upon: CHF History: 1 HTN History: 1 Diabetes History: 1 Stroke History: 2 Vascular Disease History: 1 Age Score: 2 Gender Score: 1       ASSESSMENT AND PLAN: Paroxysmal Atrial Fibrillation (ICD10:  I48.0) The patient's CHA2DS2-VASc score is 9, indicating a 12.2% annual risk of stroke.    Patient is currently in NSR. Review of ILR shows overall low burden 0.3%. We will continue with current therapy. Continue Lopressor  50 mg  BID.   Secondary Hypercoagulable State (ICD10:  D68.69) The patient is at significant risk for stroke/thromboembolism based upon her CHA2DS2-VASc Score of 9.   Continue Eliquis  2.5 mg BID.    Follow up 1 year Afib clinic.    Terra Pac, PA-C  Afib Clinic [facility] [address] [place], [place] [address] [phone]  "

## 2024-09-01 NOTE — Patient Instructions (Signed)
 [facility] [phone]

## 2024-09-02 ENCOUNTER — Encounter

## 2024-09-03 ENCOUNTER — Other Ambulatory Visit: Payer: Self-pay | Admitting: Medical

## 2024-09-04 ENCOUNTER — Ambulatory Visit: Attending: Cardiology

## 2024-09-04 DIAGNOSIS — I480 Paroxysmal atrial fibrillation: Secondary | ICD-10-CM

## 2024-09-05 ENCOUNTER — Encounter: Payer: Self-pay | Admitting: Medical

## 2024-09-06 ENCOUNTER — Encounter

## 2024-09-06 LAB — CUP PACEART REMOTE DEVICE CHECK
Date Time Interrogation Session: 20260123231925
Implantable Pulse Generator Implant Date: 20241101

## 2024-09-07 ENCOUNTER — Ambulatory Visit: Payer: Self-pay | Admitting: Cardiology

## 2024-09-08 ENCOUNTER — Other Ambulatory Visit (HOSPITAL_COMMUNITY): Payer: Self-pay | Admitting: Internal Medicine

## 2024-09-09 ENCOUNTER — Encounter: Payer: Self-pay | Admitting: Cardiology

## 2024-09-09 ENCOUNTER — Ambulatory Visit: Attending: Cardiology | Admitting: Cardiology

## 2024-09-09 VITALS — BP 146/70 | HR 78 | Ht <= 58 in | Wt 116.1 lb

## 2024-09-09 DIAGNOSIS — E782 Mixed hyperlipidemia: Secondary | ICD-10-CM

## 2024-09-09 DIAGNOSIS — I6300 Cerebral infarction due to thrombosis of unspecified precerebral artery: Secondary | ICD-10-CM

## 2024-09-09 DIAGNOSIS — I480 Paroxysmal atrial fibrillation: Secondary | ICD-10-CM

## 2024-09-09 DIAGNOSIS — I5033 Acute on chronic diastolic (congestive) heart failure: Secondary | ICD-10-CM

## 2024-09-09 DIAGNOSIS — D6869 Other thrombophilia: Secondary | ICD-10-CM

## 2024-09-09 DIAGNOSIS — Z8673 Personal history of transient ischemic attack (TIA), and cerebral infarction without residual deficits: Secondary | ICD-10-CM | POA: Diagnosis not present

## 2024-09-09 NOTE — Progress Notes (Signed)
 Remote Loop Recorder Transmission

## 2024-09-09 NOTE — Progress Notes (Signed)
 " Cardiology Office Note:    Date:  09/09/2024   ID:  Leslie Ward, DOB [birth year], MRN [id number]  PCP:  Dorina Loving, PA-C  Cardiologist:  Jennifer JONELLE Crape, MD   Referring MD: Dorina Loving, PA-C    ASSESSMENT:    1. Mixed hyperlipidemia   2. Cerebrovascular accident (CVA) due to thrombosis of precerebral artery (HCC)   3. Acute on chronic diastolic CHF (congestive heart failure) (HCC)   4. Hypercoagulable state due to paroxysmal atrial fibrillation (HCC)   5. Paroxysmal atrial fibrillation (HCC)   6. History of stroke   7. Mixed dyslipidemia    PLAN:    In order of problems listed above:  Primary prevention stressed with the patient.  Importance of compliance with diet medication stressed and patient verbalized standing. Congestive heart failure: Stable at this time.  Clinically well compensated.  BNP was unremarkable.  Salt intake issues were discussed with family at bedside on a regular basis and keep a track.  And is stable. Essential hypertension: Blood pressure is stable and diet was emphasized.  In view of age I would prefer not to have very strict control of blood. Paroxysmal atrial fibrillation:I discussed with the patient atrial fibrillation, disease process. Management and therapy including rate and rhythm control, anticoagulation benefits and potential risks were discussed extensively with the patient. Patient had multiple questions which were answered to patient's satisfaction. Mixed dyslipidemia: On lipid-lowering medications followed by primary Patient will be seen in follow-up appointment in 3 months or earlier if the patient has any concerns.    Medication Adjustments/Labs and Tests Ordered: Current medicines are reviewed at length with the patient today.  Concerns regarding medicines are outlined above.  Orders Placed This Encounter  Procedures   EKG 12-Lead   No orders of the defined types were placed in this encounter.    No chief complaint on  file.    History of Present Illness:    Leslie Ward is a 89 y.o. female.  Patient has past medical history of paroxysmal atrial fibrillation, history of stroke, essential hypertension and mixed dyslipidemia.  She has history of congestive heart failure.  She is an elderly lady and ambulates age appropriately.  She is here for follow-up.  She denies any chest pain orthopnea or PND.  Her family is accompanying her and very supportive.  At the time of my evaluation, the patient is alert awake oriented and in no distress.  Past Medical History:  Diagnosis Date   Acute encephalopathy 06/11/2023   Acute on chronic diastolic CHF (congestive heart failure) (HCC) 07/17/2024   Acute respiratory failure with hypoxia secondary to acute on chronic diastolic CHF exacerbation 07/17/2024   Adhesive capsulitis of left shoulder 12/22/2018   Bilateral shoulder pain 08/24/2021   Blepharitis of upper and lower eyelids of both eyes 01/07/2020   BPPV (benign paroxysmal positional vertigo), right 01/03/2021   Cerebrovascular accident (CVA) (HCC) 10/25/2021   Cervical dystonia 03/08/2019   Xeomin  approved diagnosis.   COVID 05/29/2021   Dermatochalasis of both upper eyelids 01/07/2020   Dystonia    Edema 02/11/2019   Gait abnormality 10/13/2023   Ganglion cyst 08/25/2020   Glaucoma    History of stroke 10/2021   Hypercoagulable state due to paroxysmal atrial fibrillation (HCC) 07/18/2023   Hyperlipidemia 02/11/2019   Hypertension    Hypertensive retinopathy of both eyes 01/07/2020   Hypothyroidism (acquired) 01/19/2019   Inflammatory arthritis 08/24/2021   Leukocytosis 07/17/2024   Macular RPE mottling 01/07/2020   Meibomian gland  dysfunction (MGD) of both eyes 01/07/2020   Mixed incontinence 03/15/2020   Formatting of this note might be different from the original.  Added automatically from request for surgery [id number]   Neck pain 01/19/2019   Osteoarthritis of right knee 12/18/2022   Paroxysmal  atrial fibrillation (HCC) 07/18/2023   Post-COVID chronic fatigue 05/29/2021   Post-COVID chronic joint pain 05/29/2021   Post-COVID chronic muscle pain 05/29/2021   Primary open angle glaucoma (POAG) of both eyes, moderate stage 09/12/2021   Sedimentation rate elevation 08/24/2021   Stroke (HCC) 10/2021   Stroke (HCC) 05/2023   Syncope 10/09/2021   Thyroid  disease    Type 2 diabetes mellitus (HCC) 01/19/2019   Urgency incontinence 03/15/2020   Formatting of this note might be different from the original.  Added automatically from request for surgery [id number]   Urinary frequency 08/24/2021   Vertigo 12/01/2019   Vitreous syneresis of both eyes 01/07/2020    Past Surgical History:  Procedure Laterality Date   LOOP RECORDER INSERTION N/A 06/13/2023   Procedure: LOOP RECORDER INSERTION;  Surgeon: Inocencio Soyla Lunger, MD;  Location: [facility];  Service: Cardiovascular;  Laterality: N/A;    Current Medications: Active Medications[1]   Allergies:   Crestor [rosuvastatin calcium ]   Social History   Socioeconomic History   Marital status: Widowed    Spouse name: Not on file   Number of children: 2   Years of education: 6 or 7 years of education   Highest education level: Not on file  Occupational History   Occupation: Retired  Tobacco Use   Smoking status: Never    Passive exposure: Past   Smokeless tobacco: Never   Tobacco comments:    Never smoker 07/18/23  Vaping Use   Vaping status: Never Used  Substance and Sexual Activity   Alcohol  use: No   Drug use: No   Sexual activity: Not on file  Other Topics Concern   Not on file  Social History Narrative   Lives alone.   Right-handed.   No caffeine  use.   Social Drivers of Health   Tobacco Use: Low Risk (09/09/2024)   Patient History    Smoking Tobacco Use: Never    Smokeless Tobacco Use: Never    Passive Exposure: Past  Financial Resource Strain: Low Risk (10/01/2021)   Overall Financial Resource Strain  (CARDIA)    Difficulty of Paying Living Expenses: Not hard at all  Food Insecurity: No Food Insecurity (07/21/2024)   Epic    Worried About Programme Researcher, Broadcasting/film/video in the Last Year: Never true    Ran Out of Food in the Last Year: Never true  Transportation Needs: No Transportation Needs (07/21/2024)   Epic    Lack of Transportation (Medical): No    Lack of Transportation (Non-Medical): No  Physical Activity: Inactive (10/01/2021)   Exercise Vital Sign    Days of Exercise per Week: 0 days    Minutes of Exercise per Session: 0 min  Stress: No Stress Concern Present (10/01/2021)   [employer] of Occupational Health - Occupational Stress Questionnaire    Feeling of Stress : Not at all  Social Connections: Moderately Integrated (07/17/2024)   Social Connection and Isolation Panel    Frequency of Communication with Friends and Family: More than three times a week    Frequency of Social Gatherings with Friends and Family: Twice a week    Attends Religious Services: More than 4 times per year    Active Member of  Clubs or Organizations: Yes    Attends Banker Meetings: More than 4 times per year    Marital Status: Widowed  Depression (PHQ2-9): Low Risk (04/30/2024)   Depression (PHQ2-9)    PHQ-2 Score: 0  Alcohol  Screen: Low Risk (12/18/2022)   Alcohol  Screen    Last Alcohol  Screening Score (AUDIT): 0  Housing: Unknown (07/21/2024)   Epic    Unable to Pay for Housing in the Last Year: No    Number of Times Moved in the Last Year: Not on file    Homeless in the Last Year: No  Utilities: Not At Risk (07/21/2024)   Epic    Threatened with loss of utilities: No  Health Literacy: Not on file     Family History: The patient's family history includes Heart Problems in her brother; Heart attack in her father; High blood pressure in her maternal grandmother; Stroke in her mother. There is no history of Liver disease, Colon cancer, or Esophageal cancer.  ROS:   Please see the  history of present illness.    All other systems reviewed and are negative.  EKGs/Labs/Other Studies Reviewed:    The following studies were reviewed today: .[name]   I discussed my findings with the patient at length   Recent Labs: 07/17/2024: Magnesium 2.6; TSH 1.130 07/18/2024: Hemoglobin 12.2; Platelets 295 07/27/2024: ALT 10; BUN 27; Creatinine, Ser 0.88; Potassium 4.0; Pro B Natriuretic peptide (BNP) 136.0; Sodium 137  Recent Lipid Panel    Component Value Date/Time   CHOL 166 02/24/2024 1218   CHOL 275 (H) 11/09/2020 1101   TRIG 101.0 02/24/2024 1218   HDL 71.30 02/24/2024 1218   HDL 67 11/09/2020 1101   CHOLHDL 2 02/24/2024 1218   VLDL 20.2 02/24/2024 1218   LDLCALC 75 02/24/2024 1218   LDLCALC 188 (H) 11/09/2020 1101    Physical Exam:    VS:  BP (!) 146/70   Pulse 78   Ht 4' 9 (1.448 m)   Wt 116 lb 1.3 oz (52.7 kg)   SpO2 92%   BMI 25.12 kg/m     Wt Readings from Last 3 Encounters:  09/09/24 116 lb 1.3 oz (52.7 kg)  09/01/24 120 lb 9.6 oz (54.7 kg)  07/27/24 120 lb 3.2 oz (54.5 kg)     GEN: Patient is in no acute distress HEENT: Normal NECK: No JVD; No carotid bruits LYMPHATICS: No lymphadenopathy CARDIAC: Hear sounds regular, 2/6 systolic murmur at the apex. RESPIRATORY:  Clear to auscultation without rales, wheezing or rhonchi  ABDOMEN: Soft, non-tender, non-distended MUSCULOSKELETAL:  No edema; No deformity  SKIN: Warm and dry NEUROLOGIC:  Alert and oriented x 3 PSYCHIATRIC:  Normal affect   Signed, Jennifer JONELLE Crape, MD  09/09/2024 12:04 PM    [facility]     [1]  Current Meds  Medication Sig   amLODipine  (NORVASC ) 5 MG tablet TAKE 1 TABLET BY MOUTH 2 TIMES A DAY   apixaban  (ELIQUIS ) 2.5 MG TABS tablet TAKE 1 TABLET BY MOUTH 2 TIMES A DAY   atorvastatin  (LIPITOR) 20 MG tablet Take 1 tablet (20 mg total) by mouth daily.   Calcium  Carbonate (CALCIUM  600 PO) Take 1 tablet by mouth at bedtime.   Carboxymethylcellulose  Sodium (ARTIFICIAL TEARS OP) Place 1 drop into both eyes in the morning and at bedtime.   Cholecalciferol (VITAMIN D) 125 MCG (5000 UT) CAPS Take 5,000 Units by mouth daily.   Cinnamon 500 MG capsule Take 1,000 mg by mouth  daily.   dorzolamide -timolol  (COSOPT ) 2-0.5 % ophthalmic solution Place 1 drop into both eyes 2 (two) times daily.   estradiol (ESTRACE) 0.01 % CREA vaginal cream Place 1 Applicatorful vaginally at bedtime.   fluticasone  (FLONASE ) 50 MCG/ACT nasal spray Place 2 sprays into both nostrils daily. (Patient taking differently: Place 2 sprays into both nostrils daily as needed for allergies.)   furosemide  (LASIX ) 20 MG tablet TAKE 1 TABLET BY MOUTH DAILY   latanoprost  (XALATAN ) 0.005 % ophthalmic solution Place 1 drop into both eyes every evening.   levothyroxine  (SYNTHROID ) 50 MCG tablet Take 1 tablet (50 mcg total) by mouth daily before breakfast.   metoprolol  tartrate (LOPRESSOR ) 50 MG tablet TAKE 1 TABLET BY MOUTH 2 TIMES A DAY   Probiotic Product (PROBIOTIC DAILY PO) Take 1 tablet by mouth at bedtime.   vitamin C (ASCORBIC ACID) 500 MG tablet Take 500 mg by mouth daily.   Current Facility-Administered Medications for the 09/09/24 encounter (Office Visit) with Lilla Callejo R, MD  Medication   incobotulinumtoxinA  (XEOMIN ) 100 units injection 100 Units   incobotulinumtoxinA  (XEOMIN ) 100 units injection 100 Units   "

## 2024-09-09 NOTE — Patient Instructions (Addendum)
 Medication Instructions:  Your physician recommends that you continue on your current medications as directed. Please refer to the Current Medication list given to you today.  *If you need a refill on your cardiac medications before your next appointment, please call your pharmacy*   Lab Work: None ordered If you have labs (blood work) drawn today and your tests are completely normal, you will receive your results only by: MyChart Message (if you have MyChart) OR A paper copy in the mail If you have any lab test that is abnormal or we need to change your treatment, we will call you to review the results.   Testing/Procedures: None ordered   Follow-Up: At [facility], you and your health needs are our priority.  As part of our continuing mission to provide you with exceptional heart care, we have created designated Provider Care Teams.  These Care Teams include your primary Cardiologist (physician) and Advanced Practice Providers (APPs -  Physician Assistants and Nurse Practitioners) who all work together to provide you with the care you need, when you need it.  We recommend signing up for the patient portal called MyChart.  Sign up information is provided on this After Visit Summary.  MyChart is used to connect with patients for Virtual Visits (Telemedicine).  Patients are able to view lab/test results, encounter notes, upcoming appointments, etc.  Non-urgent messages can be sent to your provider as well.   To learn more about what you can do with MyChart, go to [email or web address].    Your next appointment:   3 month(s)  The format for your next appointment:   In Person  Provider:   Jennifer Crape, MD    Other Instructions            Important Information About Sugar

## 2024-09-10 ENCOUNTER — Ambulatory Visit: Admitting: Medical

## 2024-09-15 ENCOUNTER — Other Ambulatory Visit: Payer: Self-pay | Admitting: Medical

## 2024-09-17 ENCOUNTER — Other Ambulatory Visit: Payer: Self-pay | Admitting: Medical

## 2024-10-03 ENCOUNTER — Encounter

## 2024-10-05 ENCOUNTER — Ambulatory Visit

## 2024-10-07 ENCOUNTER — Encounter

## 2024-11-03 ENCOUNTER — Encounter

## 2024-11-05 ENCOUNTER — Ambulatory Visit

## 2024-11-08 ENCOUNTER — Encounter

## 2024-12-04 ENCOUNTER — Encounter

## 2024-12-09 ENCOUNTER — Encounter

## 2024-12-16 ENCOUNTER — Ambulatory Visit: Admitting: Cardiology

## 2025-01-04 ENCOUNTER — Encounter

## 2025-01-10 ENCOUNTER — Encounter

## 2025-02-04 ENCOUNTER — Encounter

## 2025-02-10 ENCOUNTER — Encounter

## 2025-03-07 ENCOUNTER — Encounter

## 2025-03-14 ENCOUNTER — Encounter

## 2025-04-14 ENCOUNTER — Encounter

## 2025-05-16 ENCOUNTER — Encounter

## 2025-06-16 ENCOUNTER — Encounter
